# Patient Record
Sex: Female | Born: 1980 | Race: White | Hispanic: No | Smoking: Former smoker
Health system: Southern US, Community
[De-identification: ages and names within clinical notes are randomized; demographics above are authoritative.]

## PROBLEM LIST (undated history)

## (undated) DIAGNOSIS — K219 Gastro-esophageal reflux disease without esophagitis: Secondary | ICD-10-CM

## (undated) DIAGNOSIS — N189 Chronic kidney disease, unspecified: Secondary | ICD-10-CM

## (undated) DIAGNOSIS — F603 Borderline personality disorder: Secondary | ICD-10-CM

## (undated) DIAGNOSIS — F32A Depression, unspecified: Secondary | ICD-10-CM

## (undated) DIAGNOSIS — T4145XA Adverse effect of unspecified anesthetic, initial encounter: Secondary | ICD-10-CM

## (undated) DIAGNOSIS — N63 Unspecified lump in unspecified breast: Secondary | ICD-10-CM

## (undated) DIAGNOSIS — M419 Scoliosis, unspecified: Secondary | ICD-10-CM

## (undated) DIAGNOSIS — E785 Hyperlipidemia, unspecified: Secondary | ICD-10-CM

## (undated) DIAGNOSIS — J45909 Unspecified asthma, uncomplicated: Secondary | ICD-10-CM

## (undated) DIAGNOSIS — Z87442 Personal history of urinary calculi: Secondary | ICD-10-CM

## (undated) DIAGNOSIS — T7840XA Allergy, unspecified, initial encounter: Secondary | ICD-10-CM

## (undated) DIAGNOSIS — G4730 Sleep apnea, unspecified: Secondary | ICD-10-CM

## (undated) DIAGNOSIS — IMO0002 Reserved for concepts with insufficient information to code with codable children: Secondary | ICD-10-CM

## (undated) DIAGNOSIS — F4310 Post-traumatic stress disorder, unspecified: Secondary | ICD-10-CM

## (undated) DIAGNOSIS — T753XXA Motion sickness, initial encounter: Secondary | ICD-10-CM

## (undated) DIAGNOSIS — T8859XA Other complications of anesthesia, initial encounter: Secondary | ICD-10-CM

## (undated) DIAGNOSIS — M1990 Unspecified osteoarthritis, unspecified site: Secondary | ICD-10-CM

## (undated) DIAGNOSIS — I10 Essential (primary) hypertension: Secondary | ICD-10-CM

## (undated) DIAGNOSIS — F419 Anxiety disorder, unspecified: Secondary | ICD-10-CM

## (undated) DIAGNOSIS — IMO0001 Reserved for inherently not codable concepts without codable children: Secondary | ICD-10-CM

## (undated) HISTORY — DX: Allergy, unspecified, initial encounter: T78.40XA

## (undated) HISTORY — DX: Depression, unspecified: F32.A

## (undated) HISTORY — DX: Unspecified asthma, uncomplicated: J45.909

## (undated) HISTORY — DX: Unspecified osteoarthritis, unspecified site: M19.90

## (undated) HISTORY — DX: Essential (primary) hypertension: I10

## (undated) HISTORY — DX: Unspecified lump in unspecified breast: N63.0

## (undated) HISTORY — DX: Reserved for concepts with insufficient information to code with codable children: IMO0002

## (undated) HISTORY — PX: ESOPHAGOGASTRODUODENOSCOPY: SHX1529

---
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
## 1999-01-09 DIAGNOSIS — IMO0002 Reserved for concepts with insufficient information to code with codable children: Secondary | ICD-10-CM

## 1999-01-09 HISTORY — DX: Reserved for concepts with insufficient information to code with codable children: IMO0002

## 2001-01-08 DIAGNOSIS — J45909 Unspecified asthma, uncomplicated: Secondary | ICD-10-CM

## 2001-01-08 HISTORY — DX: Unspecified asthma, uncomplicated: J45.909

## 2002-01-08 HISTORY — PX: GUM SURGERY: SHX658

## 2004-01-09 DIAGNOSIS — M1990 Unspecified osteoarthritis, unspecified site: Secondary | ICD-10-CM

## 2004-01-09 HISTORY — DX: Unspecified osteoarthritis, unspecified site: M19.90

## 2006-01-08 HISTORY — PX: ABDOMINAL HYSTERECTOMY: SHX81

## 2008-05-13 ENCOUNTER — Emergency Department: Payer: Self-pay | Admitting: Unknown Physician Specialty

## 2008-09-06 ENCOUNTER — Emergency Department: Payer: Self-pay | Admitting: Internal Medicine

## 2008-12-18 ENCOUNTER — Emergency Department: Payer: Self-pay | Admitting: Emergency Medicine

## 2009-04-07 ENCOUNTER — Emergency Department: Payer: Self-pay | Admitting: Emergency Medicine

## 2009-06-28 ENCOUNTER — Emergency Department: Payer: Self-pay | Admitting: Emergency Medicine

## 2010-01-13 ENCOUNTER — Emergency Department: Payer: Self-pay | Admitting: Unknown Physician Specialty

## 2010-06-06 ENCOUNTER — Emergency Department: Payer: Self-pay | Admitting: Emergency Medicine

## 2010-06-12 ENCOUNTER — Emergency Department: Payer: Self-pay | Admitting: Emergency Medicine

## 2011-01-09 DIAGNOSIS — I10 Essential (primary) hypertension: Secondary | ICD-10-CM

## 2011-01-09 DIAGNOSIS — N63 Unspecified lump in unspecified breast: Secondary | ICD-10-CM

## 2011-01-09 HISTORY — DX: Essential (primary) hypertension: I10

## 2011-01-09 HISTORY — DX: Unspecified lump in unspecified breast: N63.0

## 2011-01-09 HISTORY — PX: BREAST BIOPSY: SHX20

## 2011-02-05 ENCOUNTER — Emergency Department: Payer: Self-pay | Admitting: Emergency Medicine

## 2011-02-28 ENCOUNTER — Emergency Department: Payer: Self-pay | Admitting: Emergency Medicine

## 2011-10-13 ENCOUNTER — Emergency Department: Payer: Self-pay | Admitting: Unknown Physician Specialty

## 2011-10-17 ENCOUNTER — Ambulatory Visit: Payer: Self-pay

## 2011-10-18 ENCOUNTER — Ambulatory Visit: Payer: Self-pay

## 2012-01-29 ENCOUNTER — Emergency Department: Payer: Self-pay | Admitting: Emergency Medicine

## 2012-03-08 ENCOUNTER — Encounter: Payer: Self-pay | Admitting: *Deleted

## 2012-03-12 ENCOUNTER — Emergency Department: Payer: Self-pay | Admitting: Emergency Medicine

## 2012-04-14 ENCOUNTER — Ambulatory Visit: Payer: Self-pay | Admitting: General Surgery

## 2012-05-14 ENCOUNTER — Encounter: Payer: Self-pay | Admitting: *Deleted

## 2012-09-27 ENCOUNTER — Emergency Department: Payer: Self-pay | Admitting: Emergency Medicine

## 2012-12-31 ENCOUNTER — Emergency Department: Payer: Self-pay | Admitting: Internal Medicine

## 2012-12-31 LAB — URINALYSIS, COMPLETE
Bilirubin,UR: NEGATIVE
Blood: NEGATIVE
Glucose,UR: NEGATIVE mg/dL (ref 0–75)
Ketone: NEGATIVE
Specific Gravity: 1.014 (ref 1.003–1.030)
WBC UR: <1 /HPF (ref 0–5)

## 2012-12-31 LAB — CBC WITH DIFFERENTIAL/PLATELET
Basophil %: 0.5 %
Eosinophil #: 0.3 10*3/uL (ref 0.0–0.7)
HCT: 43.2 % (ref 35.0–47.0)
Lymphocyte #: 2 10*3/uL (ref 1.0–3.6)
Lymphocyte %: 29.5 %
MCH: 30.2 pg (ref 26.0–34.0)
MCHC: 32.8 g/dL (ref 32.0–36.0)
MCV: 92 fL (ref 80–100)
Monocyte #: 0.3 x10 3/mm (ref 0.2–0.9)
Platelet: 192 10*3/uL (ref 150–440)
RBC: 4.7 10*6/uL (ref 3.80–5.20)
RDW: 12.8 % (ref 11.5–14.5)

## 2012-12-31 LAB — COMPREHENSIVE METABOLIC PANEL
Alkaline Phosphatase: 51 U/L
Anion Gap: 5 — ABNORMAL LOW (ref 7–16)
BUN: 14 mg/dL (ref 7–18)
Bilirubin,Total: 0.3 mg/dL (ref 0.2–1.0)
Calcium, Total: 8.9 mg/dL (ref 8.5–10.1)
Co2: 27 mmol/L (ref 21–32)
Creatinine: 1.09 mg/dL (ref 0.60–1.30)
EGFR (African American): >60
EGFR (Non-African Amer.): >60
Potassium: 3.7 mmol/L (ref 3.5–5.1)
SGOT(AST): 4 U/L — ABNORMAL LOW (ref 15–37)
SGPT (ALT): 22 U/L (ref 12–78)
Total Protein: 7.3 g/dL (ref 6.4–8.2)

## 2012-12-31 LAB — LIPASE, BLOOD: Lipase: 293 U/L (ref 73–393)

## 2013-01-02 ENCOUNTER — Emergency Department: Payer: Self-pay | Admitting: Emergency Medicine

## 2013-01-03 LAB — COMPREHENSIVE METABOLIC PANEL
Albumin: 3.7 g/dL (ref 3.4–5.0)
BUN: 17 mg/dL (ref 7–18)
Bilirubin,Total: 0.3 mg/dL (ref 0.2–1.0)
Calcium, Total: 8.3 mg/dL — ABNORMAL LOW (ref 8.5–10.1)
Chloride: 108 mmol/L — ABNORMAL HIGH (ref 98–107)
Creatinine: 1.22 mg/dL (ref 0.60–1.30)
EGFR (African American): >60
EGFR (Non-African Amer.): 58 — ABNORMAL LOW
Osmolality: 279 (ref 275–301)
SGOT(AST): 13 U/L — ABNORMAL LOW (ref 15–37)
SGPT (ALT): 37 U/L (ref 12–78)
Sodium: 139 mmol/L (ref 136–145)
Total Protein: 7 g/dL (ref 6.4–8.2)

## 2013-01-03 LAB — CBC WITH DIFFERENTIAL/PLATELET
Basophil #: 0 10*3/uL (ref 0.0–0.1)
Basophil %: 0.5 %
Eosinophil %: 2.3 %
HCT: 41.4 % (ref 35.0–47.0)
Lymphocyte #: 3.2 10*3/uL (ref 1.0–3.6)
Lymphocyte %: 40.7 %
MCH: 31.2 pg (ref 26.0–34.0)
MCV: 92 fL (ref 80–100)
Monocyte %: 8.5 %
Platelet: 201 10*3/uL (ref 150–440)
RBC: 4.49 10*6/uL (ref 3.80–5.20)
RDW: 13 % (ref 11.5–14.5)
WBC: 7.9 10*3/uL (ref 3.6–11.0)

## 2013-01-03 LAB — URINALYSIS, COMPLETE
Bilirubin,UR: NEGATIVE
Blood: NEGATIVE
Nitrite: NEGATIVE
Ph: 6 (ref 4.5–8.0)
Specific Gravity: 1.025 (ref 1.003–1.030)
WBC UR: 1 /HPF (ref 0–5)

## 2013-01-03 LAB — LIPASE, BLOOD: Lipase: 194 U/L (ref 73–393)

## 2013-01-06 ENCOUNTER — Emergency Department: Payer: Self-pay | Admitting: Emergency Medicine

## 2013-01-06 LAB — CBC WITH DIFFERENTIAL/PLATELET
Basophil %: 0.6 %
Eosinophil #: 0.2 10*3/uL (ref 0.0–0.7)
Eosinophil %: 2.8 %
HGB: 13.1 g/dL (ref 12.0–16.0)
Lymphocyte #: 2 10*3/uL (ref 1.0–3.6)
Lymphocyte %: 28.3 %
MCH: 31.4 pg (ref 26.0–34.0)
MCHC: 34.2 g/dL (ref 32.0–36.0)
Monocyte #: 0.5 x10 3/mm (ref 0.2–0.9)
Neutrophil %: 61.5 %
Platelet: 179 10*3/uL (ref 150–440)
RBC: 4.16 10*6/uL (ref 3.80–5.20)
RDW: 12.6 % (ref 11.5–14.5)
WBC: 7.1 10*3/uL (ref 3.6–11.0)

## 2013-01-06 LAB — COMPREHENSIVE METABOLIC PANEL
Albumin: 3.5 g/dL (ref 3.4–5.0)
Co2: 28 mmol/L (ref 21–32)
Creatinine: 0.99 mg/dL (ref 0.60–1.30)
EGFR (African American): >60
EGFR (Non-African Amer.): >60
Osmolality: 273 (ref 275–301)
SGOT(AST): 13 U/L — ABNORMAL LOW (ref 15–37)
SGPT (ALT): 23 U/L (ref 12–78)
Sodium: 137 mmol/L (ref 136–145)

## 2013-01-06 LAB — URINALYSIS, COMPLETE
Blood: NEGATIVE
Glucose,UR: NEGATIVE mg/dL (ref 0–75)
Ph: 5 (ref 4.5–8.0)
RBC,UR: 1 /HPF (ref 0–5)
WBC UR: <1 /HPF (ref 0–5)

## 2013-01-15 ENCOUNTER — Ambulatory Visit: Payer: Self-pay | Admitting: Gastroenterology

## 2013-08-26 ENCOUNTER — Emergency Department: Payer: Self-pay | Admitting: Internal Medicine

## 2013-08-26 LAB — COMPREHENSIVE METABOLIC PANEL
ALBUMIN: 3.8 g/dL (ref 3.4–5.0)
ALK PHOS: 41 U/L — AB
ALT: 19 U/L
Anion Gap: 8 (ref 7–16)
BUN: 14 mg/dL (ref 7–18)
Bilirubin,Total: 0.4 mg/dL (ref 0.2–1.0)
CALCIUM: 8.6 mg/dL (ref 8.5–10.1)
CHLORIDE: 110 mmol/L — AB (ref 98–107)
CREATININE: 0.89 mg/dL (ref 0.60–1.30)
Co2: 26 mmol/L (ref 21–32)
EGFR (African American): >60
EGFR (Non-African Amer.): >60
GLUCOSE: 84 mg/dL (ref 65–99)
OSMOLALITY: 287 (ref 275–301)
Potassium: 4.1 mmol/L (ref 3.5–5.1)
SGOT(AST): <5 U/L — ABNORMAL LOW (ref 15–37)
Sodium: 144 mmol/L (ref 136–145)
TOTAL PROTEIN: 6.7 g/dL (ref 6.4–8.2)

## 2013-08-26 LAB — URINALYSIS, COMPLETE
BLOOD: NEGATIVE
Bilirubin,UR: NEGATIVE
GLUCOSE, UR: NEGATIVE mg/dL (ref 0–75)
KETONE: NEGATIVE
LEUKOCYTE ESTERASE: NEGATIVE
Nitrite: NEGATIVE
PROTEIN: NEGATIVE
Ph: 6 (ref 4.5–8.0)
RBC,UR: 1 /HPF (ref 0–5)
Specific Gravity: 1.021 (ref 1.003–1.030)

## 2013-08-26 LAB — DRUG SCREEN, URINE

## 2013-08-26 LAB — TSH: Thyroid Stimulating Horm: 1.65 u[IU]/mL

## 2013-08-26 LAB — CBC
HCT: 41.9 % (ref 35.0–47.0)
HGB: 13.5 g/dL (ref 12.0–16.0)
MCH: 30.6 pg (ref 26.0–34.0)
MCHC: 32.2 g/dL (ref 32.0–36.0)
MCV: 95 fL (ref 80–100)
PLATELETS: 183 10*3/uL (ref 150–440)
RBC: 4.4 10*6/uL (ref 3.80–5.20)
RDW: 12.6 % (ref 11.5–14.5)
WBC: 5.3 10*3/uL (ref 3.6–11.0)

## 2013-08-26 LAB — ACETAMINOPHEN LEVEL: Acetaminophen: <2 ug/mL

## 2013-08-26 LAB — SALICYLATE LEVEL

## 2013-11-09 ENCOUNTER — Encounter: Payer: Self-pay | Admitting: *Deleted

## 2014-04-10 ENCOUNTER — Emergency Department
Admit: 2014-04-10 | Disposition: A | Discharge status: Home / Self Care | Payer: Self-pay | Admitting: Physician Assistant

## 2014-07-26 ENCOUNTER — Emergency Department
Admission: EM | Admit: 2014-07-26 | Discharge: 2014-07-26 | Disposition: A | Discharge status: Home / Self Care | Payer: BLUE CROSS/BLUE SHIELD | Attending: Emergency Medicine | Admitting: Emergency Medicine

## 2014-07-26 ENCOUNTER — Emergency Department: Payer: BLUE CROSS/BLUE SHIELD

## 2014-07-26 DIAGNOSIS — J45901 Unspecified asthma with (acute) exacerbation: Secondary | ICD-10-CM | POA: Diagnosis not present

## 2014-07-26 DIAGNOSIS — Z87891 Personal history of nicotine dependence: Secondary | ICD-10-CM | POA: Insufficient documentation

## 2014-07-26 DIAGNOSIS — J40 Bronchitis, not specified as acute or chronic: Secondary | ICD-10-CM

## 2014-07-26 DIAGNOSIS — I10 Essential (primary) hypertension: Secondary | ICD-10-CM | POA: Diagnosis not present

## 2014-07-26 DIAGNOSIS — R05 Cough: Secondary | ICD-10-CM | POA: Diagnosis present

## 2014-07-26 DIAGNOSIS — R059 Cough, unspecified: Secondary | ICD-10-CM

## 2014-07-26 MED ORDER — ALBUTEROL SULFATE HFA 108 (90 BASE) MCG/ACT IN AERS: 2.0000 | INHALATION_SPRAY | Freq: Four times a day (QID) | RESPIRATORY_TRACT | Status: DC | PRN

## 2014-07-26 MED ORDER — IPRATROPIUM-ALBUTEROL 0.5-2.5 (3) MG/3ML IN SOLN
3.0000 mL | Freq: Once | RESPIRATORY_TRACT | Status: AC
  Administered 2014-07-26: 3 mL via RESPIRATORY_TRACT
  Filled 2014-07-26: qty 3

## 2014-07-26 MED ORDER — PREDNISONE 50 MG PO TABS: 50.0000 mg | ORAL_TABLET | Freq: Every day | ORAL | Status: DC

## 2014-07-26 NOTE — ED Provider Notes (Signed)
[facility] Emergency Department Provider Note  ____________________________________________  Time seen: On arrival  I have reviewed the triage vital signs and the nursing notes.   HISTORY  Chief Complaint Cough    HPI Barbara Pennington is a 34 y.o. female who presents with a cough. She reports she has had a cough for approximately one month but it seemed to get much worse last night when she was exposed to dust in her basement. She has been coughing nonstop she reports that she denies fevers chills. No pleurisy. No history of blood clots, no recent surgery, no recent travel. No chest pain. She does report a history of asthma when she was in her early 66s but stopped after she quit smoking.    Past Medical History  Diagnosis Date  . Arthritis 2006  . Asthma 2003  . Hypertension 2013  . Ulcer 2001  . Lump or mass in breast 2013    RIGHT BREAST    Patient Active Problem List   Diagnosis Date Noted  . Hypertension     Past Surgical History  Procedure Laterality Date  . Abdominal hysterectomy  2008  . Tonsillectomy  1984  . Cesarean section  2007  . Gum surgery  2004    No current outpatient prescriptions on file.  Allergies Review of patient's allergies indicates no known allergies.  Family History  Problem Relation Age of Onset  . Testicular cancer Brother   . Colon cancer Paternal Grandfather     Social History History  Substance Use Topics  . Smoking status: Former Smoker -- 0.50 packs/day for 10 years  . Smokeless tobacco: Not on file  . Alcohol Use: Yes    Review of Systems  Constitutional: Negative for fever. Eyes: Negative for visual changes. ENT: Negative for sore throat   Genitourinary: Negative for dysuria. Musculoskeletal: Negative for back pain. Skin: Negative for rash. Neurological: Negative for headaches or focal weakness   ____________________________________________   PHYSICAL EXAM:  VITAL SIGNS: ED  Triage Vitals  Enc Vitals Group     BP 07/26/14 0750 134/82 mmHg     Pulse Rate 07/26/14 0750 83     Resp 07/26/14 0750 18     Temp 07/26/14 0750 98.3 F (36.8 C)     Temp Source 07/26/14 0750 Oral     SpO2 07/26/14 0750 98 %     Weight 07/26/14 0750 260 lb (117.935 kg)     Height 07/26/14 0750 5\' 4"  (1.626 m)     Head Cir --      Peak Flow --      Pain Score --      Pain Loc --      Pain Edu? --      Excl. in [place]? --      Constitutional: Alert and oriented. Well appearing and in no distress. Eyes: Conjunctivae are normal.  ENT   Head: Normocephalic and atraumatic.   Mouth/Throat: Mucous membranes are moist. Cardiovascular: Normal rate, regular rhythm.  Respiratory: Scattered wheezes, normal respiratory effort Gastrointestinal: Soft and non-tender in all quadrants. No distention. There is no CVA tenderness. Musculoskeletal: Nontender with normal range of motion in all extremities. Neurologic:  Normal speech and language. No gross focal neurologic deficits are appreciated. Skin:  Skin is warm, dry and intact. No rash noted. Psychiatric: Mood and affect are normal. Patient exhibits appropriate insight and judgment.  ____________________________________________    LABS (pertinent positives/negatives)  Labs Reviewed - No data to display  ____________________________________________  ____________________________________________    RADIOLOGY I have personally reviewed any xrays that were ordered on this patient: Chest x-ray unremarkable on my review  ____________________________________________   PROCEDURES  Procedure(s) performed: none   ____________________________________________   INITIAL IMPRESSION / ASSESSMENT AND PLAN / ED COURSE  Pertinent labs & imaging results that were available during my care of the patient were reviewed by me and considered in my medical decision making (see chart for details).  Patient well-appearing. Differential  includes bronchitis versus bronchospasm secondary to environmental exposure. We will check x-ray, do a DuoNeb and reevaluate   ----------------------------------------- 8:39 AM on 07/26/2014 ----------------------------------------- Chest x-ray read as consistent with bronchitic changes per radiologist. We will treat with steroid's and albuterol inhaler as needed  ____________________________________________   FINAL CLINICAL IMPRESSION(S) / ED DIAGNOSES  Final diagnoses:  Bronchitis     Lavonia Drafts, MD 07/26/14 [id number]

## 2014-07-26 NOTE — ED Notes (Signed)
Pt c/o nonproductive cough with some SOB since going into a dusty basement yesterday

## 2014-07-26 NOTE — Discharge Instructions (Signed)
Upper Respiratory Infection, Adult An upper respiratory infection (URI) is also sometimes known as the common cold. The upper respiratory tract includes the nose, sinuses, throat, trachea, and bronchi. Bronchi are the airways leading to the lungs. Most people improve within 1 week, but symptoms can last up to 2 weeks. A residual cough may last even longer.  CAUSES Many different viruses can infect the tissues lining the upper respiratory tract. The tissues become irritated and inflamed and often become very moist. Mucus production is also common. A cold is contagious. You can easily spread the virus to others by oral contact. This includes kissing, sharing a glass, coughing, or sneezing. Touching your mouth or nose and then touching a surface, which is then touched by another person, can also spread the virus. SYMPTOMS  Symptoms typically develop 1 to 3 days after you come in contact with a cold virus. Symptoms vary from person to person. They may include:  Runny nose.  Sneezing.  Nasal congestion.  Sinus irritation.  Sore throat.  Loss of voice (laryngitis).  Cough.  Fatigue.  Muscle aches.  Loss of appetite.  Headache.  Low-grade fever. DIAGNOSIS  You might diagnose your own cold based on familiar symptoms, since most people get a cold 2 to 3 times a year. Your caregiver can confirm this based on your exam. Most importantly, your caregiver can check that your symptoms are not due to another disease such as strep throat, sinusitis, pneumonia, asthma, or epiglottitis. Blood tests, throat tests, and X-rays are not necessary to diagnose a common cold, but they may sometimes be helpful in excluding other more serious diseases. Your caregiver will decide if any further tests are required. RISKS AND COMPLICATIONS  You may be at risk for a more severe case of the common cold if you smoke cigarettes, have chronic heart disease (such as heart failure) or lung disease (such as asthma), or if  you have a weakened immune system. The very young and very old are also at risk for more serious infections. Bacterial sinusitis, middle ear infections, and bacterial pneumonia can complicate the common cold. The common cold can worsen asthma and chronic obstructive pulmonary disease (COPD). Sometimes, these complications can require emergency medical care and may be life-threatening. PREVENTION  The best way to protect against getting a cold is to practice good hygiene. Avoid oral or hand contact with people with cold symptoms. Wash your hands often if contact occurs. There is no clear evidence that vitamin C, vitamin E, echinacea, or exercise reduces the chance of developing a cold. However, it is always recommended to get plenty of rest and practice good nutrition. TREATMENT  Treatment is directed at relieving symptoms. There is no cure. Antibiotics are not effective, because the infection is caused by a virus, not by bacteria. Treatment may include:  Increased fluid intake. Sports drinks offer valuable electrolytes, sugars, and fluids.  Breathing heated mist or steam (vaporizer or shower).  Eating chicken soup or other clear broths, and maintaining good nutrition.  Getting plenty of rest.  Using gargles or lozenges for comfort.  Controlling fevers with ibuprofen or acetaminophen as directed by your caregiver.  Increasing usage of your inhaler if you have asthma. Zinc gel and zinc lozenges, taken in the first 24 hours of the common cold, can shorten the duration and lessen the severity of symptoms. Pain medicines may help with fever, muscle aches, and throat pain. A variety of non-prescription medicines are available to treat congestion and runny nose. Your caregiver   can make recommendations and may suggest nasal or lung inhalers for other symptoms.  HOME CARE INSTRUCTIONS   Only take over-the-counter or prescription medicines for pain, discomfort, or fever as directed by your  caregiver.  Use a warm mist humidifier or inhale steam from a shower to increase air moisture. This may keep secretions moist and make it easier to breathe.  Drink enough water and fluids to keep your urine clear or pale yellow.  Rest as needed.  Return to work when your temperature has returned to normal or as your caregiver advises. You may need to stay home longer to avoid infecting others. You can also use a face mask and careful hand washing to prevent spread of the virus. SEEK MEDICAL CARE IF:   After the first few days, you feel you are getting worse rather than better.  You need your caregiver's advice about medicines to control symptoms.  You develop chills, worsening shortness of breath, or brown or red sputum. These may be signs of pneumonia.  You develop yellow or brown nasal discharge or pain in the face, especially when you bend forward. These may be signs of sinusitis.  You develop a fever, swollen neck glands, pain with swallowing, or white areas in the back of your throat. These may be signs of strep throat. SEEK IMMEDIATE MEDICAL CARE IF:   You have a fever.  You develop severe or persistent headache, ear pain, sinus pain, or chest pain.  You develop wheezing, a prolonged cough, cough up blood, or have a change in your usual mucus (if you have chronic lung disease).  You develop sore muscles or a stiff neck. Document Released: 06/20/2000 Document Revised: 03/19/2011 Document Reviewed: 04/01/2013 ExitCare Patient Information 2015 [employer]. This information is not intended to replace advice given to you by your health care provider. Make sure you discuss any questions you have with your health care provider.  

## 2014-07-26 NOTE — ED Notes (Signed)
C/o cough past few weeks, hx asthma 15 years ago when she used to smoke, does not smoke now

## 2014-08-13 ENCOUNTER — Encounter: Payer: Self-pay | Admitting: *Deleted

## 2014-08-17 NOTE — Discharge Instructions (Signed)
[facility] °[facility] ° °POST OPERATIVE INSTRUCTIONS FOR DR. TROXLER AND DR. FOWLER °[facility] ° ° °1. Take your medication as prescribed.  Pain medication should be taken only as needed. ° °2. Keep the dressing clean, dry and intact. ° °3. Keep your foot elevated above the heart level for the first 48 hours. ° °4. Walking to the bathroom and brief periods of walking are acceptable, unless we have instructed you to be non-weight bearing. ° °5. Always wear your post-op shoe when walking.  Always use your crutches if you are to be non-weight bearing. ° °6. Do not take a shower. Baths are permissible as long as the foot is kept out of the water.  ° °7. Every hour you are awake:  °- Bend your knee 15 times. °- Flex foot 15 times °- Massage calf 15 times ° °8. Call [facility] ([phone]) if any of the following problems occur: °- You develop a temperature or fever. °- The bandage becomes saturated with blood. °- Medication does not stop your pain. °- Injury of the foot occurs. °- Any symptoms of infection including redness, odor, or red streaks running from wound. °-  ° °General Anesthesia, Care After °Refer to this sheet in the next few weeks. These instructions provide you with information on caring for yourself after your procedure. Your health care provider may also give you more specific instructions. Your treatment has been planned according to current medical practices, but problems sometimes occur. Call your health care provider if you have any problems or questions after your procedure. °WHAT TO EXPECT AFTER THE PROCEDURE °After the procedure, it is typical to experience: °· Sleepiness. °· Nausea and vomiting. °HOME CARE INSTRUCTIONS °· For the first 24 hours after general anesthesia: °¨ Have a responsible person with you. °¨ Do not drive a car. If you are alone, do not take public transportation. °¨ Do not drink alcohol. °¨ Do not take medicine  that has not been prescribed by your health care provider. °¨ Do not sign important papers or make important decisions. °¨ You may resume a normal diet and activities as directed by your health care provider. °· Change bandages (dressings) as directed. °· If you have questions or problems that seem related to general anesthesia, call the hospital and ask for the anesthetist or anesthesiologist on call. °SEEK MEDICAL CARE IF: °· You have nausea and vomiting that continue the day after anesthesia. °· You develop a rash. °SEEK IMMEDIATE MEDICAL CARE IF:  °· You have difficulty breathing. °· You have chest pain. °· You have any allergic problems. °Document Released: 04/02/2000 Document Revised: 12/30/2012 Document Reviewed: 07/10/2012 °ExitCare® Patient Information ©2015 [employer]. This information is not intended to replace advice given to you by your health care provider. Make sure you discuss any questions you have with your health care provider. ° °

## 2014-08-19 ENCOUNTER — Encounter
Admission: RE | Disposition: A | Discharge status: Home / Self Care | Payer: Self-pay | Source: Ambulatory Visit | Attending: Podiatry

## 2014-08-19 ENCOUNTER — Ambulatory Visit
Admission: RE | Admit: 2014-08-19 | Discharge: 2014-08-19 | Disposition: A | Discharge status: Home / Self Care | Payer: BLUE CROSS/BLUE SHIELD | Source: Ambulatory Visit | Attending: Podiatry | Admitting: Podiatry

## 2014-08-19 ENCOUNTER — Ambulatory Visit: Payer: BLUE CROSS/BLUE SHIELD | Admitting: Anesthesiology

## 2014-08-19 ENCOUNTER — Encounter: Payer: Self-pay | Admitting: *Deleted

## 2014-08-19 DIAGNOSIS — Z90710 Acquired absence of both cervix and uterus: Secondary | ICD-10-CM | POA: Insufficient documentation

## 2014-08-19 DIAGNOSIS — Z91040 Latex allergy status: Secondary | ICD-10-CM | POA: Insufficient documentation

## 2014-08-19 DIAGNOSIS — M722 Plantar fascial fibromatosis: Secondary | ICD-10-CM | POA: Diagnosis present

## 2014-08-19 DIAGNOSIS — F17200 Nicotine dependence, unspecified, uncomplicated: Secondary | ICD-10-CM | POA: Diagnosis not present

## 2014-08-19 DIAGNOSIS — Z9889 Other specified postprocedural states: Secondary | ICD-10-CM | POA: Diagnosis not present

## 2014-08-19 DIAGNOSIS — M069 Rheumatoid arthritis, unspecified: Secondary | ICD-10-CM | POA: Insufficient documentation

## 2014-08-19 DIAGNOSIS — Z791 Long term (current) use of non-steroidal anti-inflammatories (NSAID): Secondary | ICD-10-CM | POA: Diagnosis not present

## 2014-08-19 DIAGNOSIS — Z79899 Other long term (current) drug therapy: Secondary | ICD-10-CM | POA: Insufficient documentation

## 2014-08-19 DIAGNOSIS — F329 Major depressive disorder, single episode, unspecified: Secondary | ICD-10-CM | POA: Insufficient documentation

## 2014-08-19 DIAGNOSIS — J45909 Unspecified asthma, uncomplicated: Secondary | ICD-10-CM | POA: Insufficient documentation

## 2014-08-19 HISTORY — DX: Scoliosis, unspecified: M41.9

## 2014-08-19 HISTORY — DX: Motion sickness, initial encounter: T75.3XXA

## 2014-08-19 HISTORY — DX: Adverse effect of unspecified anesthetic, initial encounter: T41.45XA

## 2014-08-19 HISTORY — DX: Reserved for inherently not codable concepts without codable children: IMO0001

## 2014-08-19 HISTORY — DX: Other complications of anesthesia, initial encounter: T88.59XA

## 2014-08-19 HISTORY — PX: PLANTAR FASCIA RELEASE: SHX2239

## 2014-08-19 SURGERY — FASCIOTOMY, PLANTAR, ENDOSCOPIC
Anesthesia: Regional | Laterality: Left | Wound class: Clean

## 2014-08-19 MED ORDER — OXYCODONE-ACETAMINOPHEN 7.5-325 MG PO TABS: 1.0000 | ORAL_TABLET | ORAL | Status: DC | PRN

## 2014-08-19 MED ORDER — ROPIVACAINE HCL 5 MG/ML IJ SOLN
INTRAMUSCULAR | Status: DC | PRN
  Administered 2014-08-19: 200 mg via EPIDURAL

## 2014-08-19 MED ORDER — OXYCODONE HCL 5 MG PO TABS: 5.0000 mg | ORAL_TABLET | Freq: Once | ORAL | Status: DC | PRN

## 2014-08-19 MED ORDER — FENTANYL CITRATE (PF) 100 MCG/2ML IJ SOLN
INTRAMUSCULAR | Status: DC | PRN
  Administered 2014-08-19: 100 ug via INTRAVENOUS

## 2014-08-19 MED ORDER — ONDANSETRON HCL 4 MG/2ML IJ SOLN: 4.0000 mg | Freq: Once | INTRAMUSCULAR | Status: DC | PRN

## 2014-08-19 MED ORDER — HYDROMORPHONE HCL 1 MG/ML IJ SOLN: 0.2500 mg | INTRAMUSCULAR | Status: DC | PRN

## 2014-08-19 MED ORDER — MIDAZOLAM HCL 2 MG/2ML IJ SOLN
INTRAMUSCULAR | Status: DC | PRN
  Administered 2014-08-19: 2 mg via INTRAVENOUS

## 2014-08-19 MED ORDER — LACTATED RINGERS IV SOLN
INTRAVENOUS | Status: DC
  Administered 2014-08-19: 08:00:00 via INTRAVENOUS

## 2014-08-19 MED ORDER — PROPOFOL 10 MG/ML IV BOLUS
INTRAVENOUS | Status: DC | PRN
  Administered 2014-08-19: 200 mg via INTRAVENOUS

## 2014-08-19 MED ORDER — ONDANSETRON HCL 4 MG/2ML IJ SOLN
INTRAMUSCULAR | Status: DC | PRN
  Administered 2014-08-19: 4 mg via INTRAVENOUS

## 2014-08-19 MED ORDER — BUPIVACAINE HCL (PF) 0.5 % IJ SOLN
INTRAMUSCULAR | Status: DC | PRN
  Administered 2014-08-19: 7 mL

## 2014-08-19 MED ORDER — DEXAMETHASONE SODIUM PHOSPHATE 4 MG/ML IJ SOLN
INTRAMUSCULAR | Status: DC | PRN
  Administered 2014-08-19: 8 mg via INTRAVENOUS

## 2014-08-19 MED ORDER — LIDOCAINE HCL (CARDIAC) 20 MG/ML IV SOLN
INTRAVENOUS | Status: DC | PRN
  Administered 2014-08-19: 40 mg via INTRATRACHEAL

## 2014-08-19 MED ORDER — CEFAZOLIN SODIUM-DEXTROSE 2-3 GM-% IV SOLR
2.0000 g | Freq: Once | INTRAVENOUS | Status: AC
  Administered 2014-08-19: 2 g via INTRAVENOUS

## 2014-08-19 MED ORDER — OXYCODONE HCL 5 MG/5ML PO SOLN: 5.0000 mg | Freq: Once | ORAL | Status: DC | PRN

## 2014-08-19 SURGICAL SUPPLY — 66 items
BANDAGE ELASTIC 4 CLIP NS LF (GAUZE/BANDAGES/DRESSINGS) ×2 IMPLANT
BENZOIN TINCTURE PRP APPL 2/3 (GAUZE/BANDAGES/DRESSINGS) IMPLANT
BLADE CRESCENTIC (BLADE) IMPLANT
BLADE ENDOTRAC PUSH EPF/EGR (MISCELLANEOUS) ×2 IMPLANT
BLADE MED AGGRESSIVE (BLADE) IMPLANT
BLADE MINI RND TIP GREEN BEAV (BLADE) IMPLANT
BLADE OSC/SAGITTAL 5.5X25 (BLADE) IMPLANT
BLADE OSC/SAGITTAL MD 5.5X18 (BLADE) IMPLANT
BLADE OSC/SAGITTAL MD 9X18.5 (BLADE) IMPLANT
BLADE OSCILLATING/SAGITTAL (BLADE)
BLADE SURG 15 STRL LF DISP TIS (BLADE) IMPLANT
BLADE SURG 15 STRL SS (BLADE)
BLADE SW THK.38XMED LNG THN (BLADE) IMPLANT
BLADE TRIANGLE EPF/EGR ENDO (BLADE) ×2 IMPLANT
BNDG ESMARK 4X12 TAN STRL LF (GAUZE/BANDAGES/DRESSINGS) ×2 IMPLANT
BNDG GAUZE 4.5X4.1 6PLY STRL (MISCELLANEOUS) ×2 IMPLANT
BNDG STRETCH 4X75 STRL LF (GAUZE/BANDAGES/DRESSINGS) ×2 IMPLANT
BUR EGG 4X8 MED (BURR) IMPLANT
BUR STRYKR EGG 5.0 (BURR) IMPLANT
CANISTER SUCT 1200ML W/VALVE (MISCELLANEOUS) ×2 IMPLANT
CAST PADDING 3X4FT ST 30246 (SOFTGOODS)
COVER PIN YLW 0.028-062 (MISCELLANEOUS) IMPLANT
CUFF TOURN SGL QUICK 18 (TOURNIQUET CUFF) IMPLANT
DRAPE FLUOR MINI C-ARM 54X84 (DRAPES) ×2 IMPLANT
DRILL WIRE PASS (DRILL) IMPLANT
DURAPREP 26ML APPLICATOR (WOUND CARE) ×2 IMPLANT
ETHIBOND 2 0 GREEN CT 2 30IN (SUTURE) IMPLANT
GAUZE PETRO XEROFOAM 1X8 (MISCELLANEOUS) ×2 IMPLANT
GAUZE PETRO XEROFOAM 5X9 (MISCELLANEOUS) ×2 IMPLANT
GAUZE SPONGE 4X4 12PLY STRL (GAUZE/BANDAGES/DRESSINGS) ×2 IMPLANT
GLOVE BIO SURGEON STRL SZ7.5 (GLOVE) ×2 IMPLANT
GLOVE BIO SURGEON STRL SZ8 (GLOVE) ×2 IMPLANT
GLOVE INDICATOR 8.0 STRL GRN (GLOVE) ×2 IMPLANT
GOWN STRL REUS W/ TWL XL LVL3 (GOWN DISPOSABLE) ×2 IMPLANT
GOWN STRL REUS W/TWL XL LVL3 (GOWN DISPOSABLE) ×2
K-WIRE DBL END TROCAR 6X.045 (WIRE)
K-WIRE DBL END TROCAR 6X.062 (WIRE)
KWIRE DBL END TROCAR 6X.045 (WIRE) IMPLANT
KWIRE DBL END TROCAR 6X.062 (WIRE) IMPLANT
NEEDLE HYPO 18GX1.5 BLUNT FILL (NEEDLE) IMPLANT
NEEDLE HYPO 25GX1X1/2 BEV (NEEDLE) IMPLANT
PACK EXTREMITY ARMC (MISCELLANEOUS) ×2 IMPLANT
PAD CAST CTTN 3X4 STRL (SOFTGOODS) IMPLANT
PAD GROUND ADULT SPLIT (MISCELLANEOUS) ×2 IMPLANT
RASP SM TEAR CROSS CUT (RASP) ×2 IMPLANT
SPLINT CAST 1 STEP 4X30 (MISCELLANEOUS) ×2 IMPLANT
SPLINT FAST PLASTER 5X30 (CAST SUPPLIES)
SPLINT PLASTER CAST FAST 5X30 (CAST SUPPLIES) IMPLANT
STOCKINETTE STRL 6IN 960660 (GAUZE/BANDAGES/DRESSINGS) ×2 IMPLANT
STRIP CLOSURE SKIN 1/4X4 (GAUZE/BANDAGES/DRESSINGS) ×2 IMPLANT
SUT ETHILON 4-0 (SUTURE)
SUT ETHILON 4-0 FS2 18XMFL BLK (SUTURE)
SUT ETHILON 5-0 FS-2 18 BLK (SUTURE) ×2 IMPLANT
SUT VIC AB 1 CT1 36 (SUTURE) IMPLANT
SUT VIC AB 2-0 CT1 27 (SUTURE)
SUT VIC AB 2-0 CT1 TAPERPNT 27 (SUTURE) IMPLANT
SUT VIC AB 2-0 SH 27 (SUTURE)
SUT VIC AB 2-0 SH 27XBRD (SUTURE) IMPLANT
SUT VIC AB 3-0 SH 27 (SUTURE)
SUT VIC AB 3-0 SH 27X BRD (SUTURE) IMPLANT
SUT VIC AB 4-0 FS2 27 (SUTURE) IMPLANT
SUT VICRYL AB 3-0 FS1 BRD 27IN (SUTURE) IMPLANT
SUTURE ETHLN 4-0 FS2 18XMF BLK (SUTURE) IMPLANT
SYRINGE 10CC LL (SYRINGE) IMPLANT
WAND TOPAZ MICRO DEBRIDER (MISCELLANEOUS) ×2 IMPLANT
WATER STERILE IRR 1000ML POUR (IV SOLUTION) ×2 IMPLANT

## 2014-08-19 NOTE — Anesthesia Procedure Notes (Addendum)
Anesthesia Regional Block:  Popliteal block  Pre-Anesthetic Checklist: ,, timeout performed, Correct Patient, Correct Site, Correct Laterality, Correct Procedure, Correct Position, site marked, Risks and benefits discussed,  Surgical consent,  Pre-op evaluation,  At surgeon's request and post-op pain management  Laterality: Left  Prep: chloraprep       Needles:  Injection technique: Single-shot  Needle Type: Echogenic Needle     Needle Length: 9cm 9 cm Needle Gauge: 21 and 21 G    Additional Needles:  Procedures: ultrasound guided (picture in chart) Popliteal block Narrative:  Start time: 08/19/2014 8:29 AM End time: 08/19/2014 8:38 AM Injection made incrementally with aspirations every 5 mL.  Performed by: Personally  Anesthesiologist: Amaryllis Dyke  Additional Notes: Functioning IV was confirmed and monitors applied. Ultrasound guidance: relevant anatomy identified, needle position confirmed, local anesthetic spread visualized around nerve(s)., vascular puncture avoided.  Image printed for medical record.  Negative aspiration and no paresthesias; incremental administration of local anesthetic. The patient tolerated the procedure well. Vitals signes recorded in RN notes.   Procedure Name: LMA Insertion Date/Time: 08/19/2014 9:28 AM Performed by: Londell Moh Pre-anesthesia Checklist: Patient identified, Emergency Drugs available, Suction available, Timeout performed and Patient being monitored Patient Re-evaluated:Patient Re-evaluated prior to inductionOxygen Delivery Method: Circle system utilized Preoxygenation: Pre-oxygenation with 100% oxygen Intubation Type: IV induction LMA: LMA inserted LMA Size: 4.0 Number of attempts: 1 Placement Confirmation: positive ETCO2 and breath sounds checked- equal and bilateral Tube secured with: Tape

## 2014-08-19 NOTE — Anesthesia Postprocedure Evaluation (Signed)
  Anesthesia Post-op Note  Patient: Barbara Pennington  Procedure(s) Performed: Procedure(s) with comments: ENDOSCOPIC PLANTAR FASCIOTOMY RELEASE L WITH TOPAZ (Left) - LMA WITH POPLITEAL BLOCK  Anesthesia type:General, Regional  Patient location: PACU  Post pain: Pain level controlled  Post assessment: Post-op Vital signs reviewed, Patient's Cardiovascular Status Stable, Respiratory Function Stable, Patent Airway and No signs of Nausea or vomiting  Post vital signs: Reviewed and stable  Last Vitals:  Filed Vitals:   08/19/14 1011  BP: 141/72  Pulse: 87  Temp: 36.7 C  Resp: 15    Level of consciousness: awake, alert  and patient cooperative  Complications: No apparent anesthesia complications

## 2014-08-19 NOTE — Anesthesia Preprocedure Evaluation (Signed)
Anesthesia Evaluation  Patient identified by MRN, date of birth, ID band Patient awake    Reviewed: Allergy & Precautions, NPO status , Patient's Chart, lab work & pertinent test results  History of Anesthesia Complications (+) history of anesthetic complications  Airway Mallampati: II  TM Distance: >3 FB Neck ROM: Full    Dental   Pulmonary shortness of breath, asthma , former smoker,    Pulmonary exam normal       Cardiovascular hypertension, Normal cardiovascular exam EKG (08/2013): Normal sinus rhythm Normal ECG   Neuro/Psych    GI/Hepatic   Endo/Other    Renal/GU      Musculoskeletal  (+) Arthritis -, Rheumatoid disorders,    Abdominal   Peds  Hematology   Anesthesia Other Findings   Reproductive/Obstetrics                             Anesthesia Physical Anesthesia Plan  ASA: II  Anesthesia Plan: General and Regional   Post-op Pain Management: MAC Combined w/ Regional for Post-op pain   Induction: Intravenous  Airway Management Planned: LMA  Additional Equipment:   Intra-op Plan:   Post-operative Plan: Extubation in OR  Informed Consent: I have reviewed the patients History and Physical, chart, labs and discussed the procedure including the risks, benefits and alternatives for the proposed anesthesia with the patient or authorized representative who has indicated his/her understanding and acceptance.     Plan Discussed with: CRNA  Anesthesia Plan Comments:         Anesthesia Quick Evaluation

## 2014-08-19 NOTE — Transfer of Care (Signed)
Immediate Anesthesia Transfer of Care Note  Patient: Barbara Pennington  Procedure(s) Performed: Procedure(s) with comments: ENDOSCOPIC PLANTAR FASCIOTOMY RELEASE L WITH TOPAZ (Left) - LMA WITH POPLITEAL BLOCK  Patient Location: PACU  Anesthesia Type: General, Regional  Level of Consciousness: awake, alert  and patient cooperative  Airway and Oxygen Therapy: Patient Spontanous Breathing and Patient connected to supplemental oxygen  Post-op Assessment: Post-op Vital signs reviewed, Patient's Cardiovascular Status Stable, Respiratory Function Stable, Patent Airway and No signs of Nausea or vomiting  Post-op Vital Signs: Reviewed and stable  Complications: No apparent anesthesia complications

## 2014-08-19 NOTE — Op Note (Signed)
Operative note   Surgeon: Dr. Albertine Patricia, DPM.    Assistant: None    Preop diagnosis: Chronic plantar fasciitis left foot    Postop diagnosis: Same    Procedure:   1. Endoscopic partial plantar fascial release left foot   2. Topaz fasciotomy left heel       EBL: 15 cc    Anesthesia:general    Hemostasis: Ankle tourniquet 275 mmHg pressure    Specimen: None    Complications: None    Operative indications: Chronic plantar fascial pain unresponsive to conservative care    Procedure:  Patient was brought into the OR and placed on the operating table in thesupine position. After anesthesia was obtained theleft lower extremity was prepped and draped in usual sterile fashion.  Operative Report: At this time attention was directed to the medial left heel. A 22-gauge needle was used to probe and 5 on the medial distal portion of the medial calcaneal tubercle at its end point. Once this was established a measurement was performed 1 cm distal to that and the skin was marked. A on centimeter incision was then made from dorsal to plantar in this region. This time it was probed bluntly with a hemostat palpating the plantar fascial ligament contours. This was carried across the plantar aspect of the plantar fascial ligament from medial to lateral. This point a second probe was put in and utilized to free fatty tissue away from the shoulder ligament. The trocar cannula was then introduced and carried all the way laterally where another half centimeter incision was made and the trocar cannula combo was exited at this point. Trocar was removed leaving the cannula in place. The slot was then rotated up against the plantar fascial ligament. The scope was introduced at this time and the plantar fascial ligament was well visualized. Some fatty tissues, galea medially and this was cleared away with cotton-tip applicator.  At this time the cannula was noted to be well positioned with the medial one  third markings present on the cannula. A combination of a triangular blade and a push blade was then used to release the plantar fascial ligament on the medial one third. Good visualization of the musculature deep to this was established. A picture was taken of the ligament as it was separated at the musculature area. Care was taken to release all the plantar fascial ligament in this region. This was done under tension of extension with the toes. At this time the plantar fascial ligament was palpated and seen to be significantly relaxed as compared to preoperative. The cannula was removed and then the area was copiously irrigated medial to lateral.  Procedure #2: At this time the plantar aspect of foot was evaluated earlier prior to bringing her back to surgery the plantar fascia been marked with 20 separate skin marks. This time a 0.062 K wire was used to penetrate percutaneously these areas and the Topaz wand was then inserted and each individually and used to ablate the plantar fascial ligament at these multiple sites. This was done while saline was running through the system. 1-2 seconds per ablation.  This time there is block 0.5% Marcaine plain and a sterile compressive dressing was placed across wound consisting of Xeroform gauze 4 x 4's Kling and Kerlix.    Patient tolerated the procedure and anesthesia well.  Was transported from the OR to the PACU with all vital signs stable and vascular status intact. To be discharged per routine protocol.  Will follow up  in approximately 1 week in the outpatient clinic. Patient be placed in a equalizer walker and Crutches will be utilized.

## 2014-08-20 ENCOUNTER — Encounter: Payer: Self-pay | Admitting: Podiatry

## 2014-10-15 ENCOUNTER — Encounter: Payer: Self-pay | Admitting: Emergency Medicine

## 2014-10-15 ENCOUNTER — Emergency Department
Admission: EM | Admit: 2014-10-15 | Discharge: 2014-10-15 | Disposition: A | Discharge status: Home / Self Care | Payer: BLUE CROSS/BLUE SHIELD | Attending: Emergency Medicine | Admitting: Emergency Medicine

## 2014-10-15 DIAGNOSIS — Z79899 Other long term (current) drug therapy: Secondary | ICD-10-CM | POA: Insufficient documentation

## 2014-10-15 DIAGNOSIS — Z7952 Long term (current) use of systemic steroids: Secondary | ICD-10-CM | POA: Insufficient documentation

## 2014-10-15 DIAGNOSIS — R7989 Other specified abnormal findings of blood chemistry: Secondary | ICD-10-CM | POA: Insufficient documentation

## 2014-10-15 DIAGNOSIS — I10 Essential (primary) hypertension: Secondary | ICD-10-CM | POA: Insufficient documentation

## 2014-10-15 DIAGNOSIS — Z87891 Personal history of nicotine dependence: Secondary | ICD-10-CM | POA: Insufficient documentation

## 2014-10-15 DIAGNOSIS — Z9289 Personal history of other medical treatment: Secondary | ICD-10-CM

## 2014-10-15 DIAGNOSIS — Z91040 Latex allergy status: Secondary | ICD-10-CM | POA: Insufficient documentation

## 2014-10-15 LAB — URINALYSIS COMPLETE WITH MICROSCOPIC (ARMC ONLY)
Bacteria, UA: NONE SEEN
Bilirubin Urine: NEGATIVE
Glucose, UA: NEGATIVE mg/dL
Hgb urine dipstick: NEGATIVE
Ketones, ur: NEGATIVE mg/dL
Leukocytes, UA: NEGATIVE
Nitrite: NEGATIVE
Protein, ur: NEGATIVE mg/dL
Specific Gravity, Urine: 1.021 (ref 1.005–1.030)
pH: 5 (ref 5.0–8.0)

## 2014-10-15 LAB — CHLAMYDIA/NGC RT PCR (ARMC ONLY)
CHLAMYDIA TR: NOT DETECTED
N GONORRHOEAE: NOT DETECTED

## 2014-10-15 LAB — RAPID HIV SCREEN (HIV 1/2 AB+AG)
HIV 1/2 Antibodies: NONREACTIVE
HIV-1 P24 Antigen - HIV24: NONREACTIVE

## 2014-10-15 LAB — WET PREP, GENITAL
TRICH WET PREP: NONE SEEN
WBC, Wet Prep HPF POC: NONE SEEN
Yeast Wet Prep HPF POC: NONE SEEN

## 2014-10-15 MED ORDER — PENICILLIN G BENZATHINE 1200000 UNIT/2ML IM SUSP
2.4000 10*6.[IU] | Freq: Once | INTRAMUSCULAR | Status: AC
  Administered 2014-10-15: 2.4 10*6.[IU] via INTRAMUSCULAR
  Filled 2014-10-15: qty 4

## 2014-10-15 NOTE — ED Provider Notes (Signed)
[facility] Emergency Department Provider Note  ____________________________________________  Time seen: Approximately 8:03 AM  I have reviewed the triage vital signs and the nursing notes.   HISTORY  Chief Complaint Exposure to STD    HPI Barbara Pennington is a 34 y.o. female she states that she was possibly exposed to an STD by her partner who was seen here last night. Desires to be checked out.Patient reports having a positive RPR when she attempted to donate blood today.   Past Medical History  Diagnosis Date  . Asthma 2003  . Hypertension 2013  . Ulcer 2001  . Lump or mass in breast 2013    RIGHT BREAST  . Complication of anesthesia     ran fever after c-section - had infection.  "Usually" has low temp after surgery.  . Scoliosis     no current issues  . Arthritis 2006    rhuematoid - mild - no current issues  . Shortness of breath dyspnea     secondary to weight   . Motion sickness     repeated amusement park rides    Patient Active Problem List   Diagnosis Date Noted  . Hypertension     Past Surgical History  Procedure Laterality Date  . Tonsillectomy  1984  . Cesarean section  2007  . Gum surgery  2004  . Abdominal hysterectomy  2008  . Plantar fascia release Left 08/19/2014    Procedure: ENDOSCOPIC PLANTAR FASCIOTOMY RELEASE L WITH TOPAZ;  Surgeon: Albertine Patricia, DPM;  Location: [place];  Service: Podiatry;  Laterality: Left;  LMA WITH POPLITEAL BLOCK    Current Outpatient Rx  Name  Route  Sig  Dispense  Refill  . albuterol (PROVENTIL HFA;VENTOLIN HFA) 108 (90 BASE) MCG/ACT inhaler   Inhalation   Inhale 2 puffs into the lungs every 6 (six) hours as needed for wheezing or shortness of breath.   1 Inhaler   2   . oxyCODONE-acetaminophen (PERCOCET) 7.5-325 MG per tablet   Oral   Take 1 tablet by mouth every 4 (four) hours as needed for severe pain.   30 tablet   0   . predniSONE (DELTASONE) 50 MG tablet    Oral   Take 1 tablet (50 mg total) by mouth daily with breakfast.   5 tablet   0   . sertraline (ZOLOFT) 100 MG tablet   Oral   Take 100 mg by mouth daily. AM           Allergies Latex  Family History  Problem Relation Age of Onset  . Testicular cancer Brother   . Colon cancer Paternal Grandfather     Social History Social History  Substance Use Topics  . Smoking status: Former Smoker -- 0.25 packs/day for 10 years    Quit date: 07/05/2014  . Smokeless tobacco: None  . Alcohol Use: Yes     Comment: rare    Review of Systems Constitutional: No fever/chills Eyes: No visual changes. ENT: No sore throat. Cardiovascular: Denies chest pain. Respiratory: Denies shortness of breath. Gastrointestinal: No abdominal pain.  No nausea, no vomiting.  No diarrhea.  No constipation. Genitourinary: Negative for dysuria. Musculoskeletal: Negative for back pain. Skin: Negative for rash. Neurological: Negative for headaches, focal weakness or numbness.  10-point ROS otherwise negative.  ____________________________________________   PHYSICAL EXAM:  VITAL SIGNS: ED Triage Vitals  Enc Vitals Group     BP --      Pulse --  Resp --      Temp --      Temp src --      SpO2 --      Weight 10/15/14 0800 265 lb (120.203 kg)     Height 10/15/14 0800 5\' 5"  (1.651 m)     Head Cir --      Peak Flow --      Pain Score --      Pain Loc --      Pain Edu? --      Excl. in [place]? --     Constitutional: Alert and oriented. Well appearing and in no acute distress. Eyes: Conjunctivae are normal. PERRL. EOMI. Head: Atraumatic. Nose: No congestion/rhinnorhea. Mouth/Throat: Mucous membranes are moist.  Oropharynx non-erythematous. Neck: No stridor.   Cardiovascular: Normal rate, regular rhythm. Grossly normal heart sounds.  Good peripheral circulation. Respiratory: Normal respiratory effort.  No retractions. Lungs CTAB. Gastrointestinal: Soft and nontender. No distention. No  abdominal bruits. No CVA tenderness. Genitourinary: Cervix intact nonfriable no CMT no adnexal tenderness no obvious discharge noted within the vaginal vault. Musculoskeletal: No lower extremity tenderness nor edema.  No joint effusions. Neurologic:  Normal speech and language. No gross focal neurologic deficits are appreciated. No gait instability. Skin:  Skin is warm, dry and intact. No rash noted. Psychiatric: Mood and affect are normal. Speech and behavior are normal.  ____________________________________________   LABS (all labs ordered are listed, but only abnormal results are displayed)  Labs Reviewed  WET PREP, GENITAL - Abnormal; Notable for the following:    Clue Cells Wet Prep HPF POC FEW (*)    All other components within normal limits  URINALYSIS COMPLETEWITH MICROSCOPIC ([facility] ONLY) - Abnormal; Notable for the following:    Color, Urine YELLOW (*)    APPearance HAZY (*)    Squamous Epithelial / LPF 0-5 (*)    All other components within normal limits  CHLAMYDIA/NGC RT PCR ([facility] ONLY)  RAPID HIV SCREEN (HIV 1/2 AB+AG)  RPR   ____________________________________________    PROCEDURES  Procedure(s) performed: None  Critical Care performed: No  ____________________________________________   INITIAL IMPRESSION / ASSESSMENT AND PLAN / ED COURSE  Pertinent labs & imaging results that were available during my care of the patient were reviewed by me and considered in my medical decision making (see chart for details).  Positive the RPR by history. Rapid HIV negative GC chlamydia pending we'll treat for the RPR and have repeated the test. Bicillin G2 0.4 million units IM given patient follow-up with her scheduled appointment health Department in one week for continued therapy weekly for 3 weeks. ____________________________________________   FINAL CLINICAL IMPRESSION(S) / ED DIAGNOSES  Final diagnoses:  History of RPR test      Arlyss Repress,  PA-C 10/15/14 1019  Delman Kitten, MD 10/15/14 1605

## 2014-10-15 NOTE — ED Notes (Addendum)
Patient reports that she was trying to donate plasma on Wednesday and was notified that she had a positive RPR. Denies any discharge or symptoms.  States she was in a relationship for 1.5 years and her bf was tested yesterday.

## 2014-10-15 NOTE — ED Notes (Signed)
Pt possibly exposed to std by her partner who was seen here last night. Wants to be checked.

## 2014-10-16 LAB — RPR: RPR: REACTIVE — AB

## 2014-10-16 LAB — RPR, QUANT+TP ABS (REFLEX): TREPONEMA PALLIDUM AB: NEGATIVE

## 2014-10-18 ENCOUNTER — Telehealth: Payer: Self-pay | Admitting: Emergency Medicine

## 2014-10-18 NOTE — ED Notes (Signed)
Pt called asking for results.  i explained that hiv , gc and chlamydia are negative.  i explained the rpr result--per meteea at [facility], pt does not have acute case and no further testing is needed.  I sent results to [facility] for review.

## 2014-11-04 ENCOUNTER — Ambulatory Visit: Payer: BLUE CROSS/BLUE SHIELD

## 2014-11-04 ENCOUNTER — Encounter (INDEPENDENT_AMBULATORY_CARE_PROVIDER_SITE_OTHER): Payer: Self-pay

## 2014-11-29 ENCOUNTER — Ambulatory Visit: Payer: BLUE CROSS/BLUE SHIELD

## 2015-02-07 ENCOUNTER — Ambulatory Visit
Admission: RE | Admit: 2015-02-07 | Discharge: 2015-02-07 | Disposition: A | Discharge status: Home / Self Care | Payer: Self-pay | Source: Ambulatory Visit | Attending: Oncology | Admitting: Oncology

## 2015-02-07 ENCOUNTER — Ambulatory Visit: Payer: Self-pay | Attending: Oncology | Admitting: *Deleted

## 2015-02-07 ENCOUNTER — Encounter: Payer: Self-pay | Admitting: *Deleted

## 2015-02-07 VITALS — BP 124/86 | HR 61 | Temp 97.2°F | Ht 67.32 in | Wt 272.6 lb

## 2015-02-07 DIAGNOSIS — N63 Unspecified lump in unspecified breast: Secondary | ICD-10-CM

## 2015-02-07 NOTE — Progress Notes (Signed)
Subjective:     Patient ID: Barbara Pennington, female   DOB: [birth year], 35 y.o.   MRN: [id number]  HPI   Review of Systems     Objective:   Physical Exam  Pulmonary/Chest: Right breast exhibits no inverted nipple, no mass, no nipple discharge, no skin change and no tenderness. Left breast exhibits mass. Left breast exhibits no inverted nipple, no nipple discharge, no skin change and no tenderness.         Assessment:     35 year old White female returns to [facility] with complaints of an enlarging left breast mass.  The patient was here in October 2013 with a mass in the same area.  Mammogram and ultrasound at that time was a birads 5.  Biopsy by Dr. 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  The patient was supposed to return in 6 months with Dr. Bary Castilla for another ultrasound, but has had no follow-up.  On clinical breast exam I can palpate an approximate 1.5 cm firm, mobile nodule at 2:30 left breast approximately 3 cm from the areola.  Taught self breast awareness.  Patient has been screened for eligibility.  She does not have any insurance, Medicare or Medicaid.  She also meets financial eligibility.  Hand-out given on the Affordable Care Act.    Plan:     Bilateral diagnostic mammogram and ultrasound ordered.  Discussed with the patient that if the radiologist want to rebiopsy the area would she like to return to Dr. Bary Castilla or have a biopsy by the breast center.  States she would like to go ahead and get it done at the breast center.  Will follow-up per protocol.

## 2015-02-07 NOTE — Patient Instructions (Signed)
Gave patient hand-out, Women Staying Healthy, Active and Well from [facility], with education on breast health, pap smears, heart and colon health. 

## 2015-02-08 ENCOUNTER — Telehealth: Payer: Self-pay | Admitting: *Deleted

## 2015-02-08 NOTE — Telephone Encounter (Signed)
Patient returned my call.  We discussed the benign mammogram results and recommendation for next mammogram at age 35.  Discussed option to see Dr. Bary Castilla for a second opinion since he was the surgeon who actually did her biopsy in 2013.  Patient states she is fine with only seeing the radiologist at this time.  They confirmed that the nodule has not had any significant change.  She is to call me back if she feels the area of concern is changing or getting larger.  She is agreeable.

## 2015-02-08 NOTE — Telephone Encounter (Signed)
Let patient a message to return my call.  I would like to discuss her mammogram results.

## 2015-05-16 ENCOUNTER — Emergency Department: Payer: Self-pay

## 2015-05-16 ENCOUNTER — Emergency Department
Admission: EM | Admit: 2015-05-16 | Discharge: 2015-05-16 | Disposition: A | Discharge status: Home / Self Care | Payer: Self-pay | Attending: Emergency Medicine | Admitting: Emergency Medicine

## 2015-05-16 ENCOUNTER — Encounter: Payer: Self-pay | Admitting: Emergency Medicine

## 2015-05-16 DIAGNOSIS — I10 Essential (primary) hypertension: Secondary | ICD-10-CM | POA: Insufficient documentation

## 2015-05-16 DIAGNOSIS — Z87891 Personal history of nicotine dependence: Secondary | ICD-10-CM | POA: Insufficient documentation

## 2015-05-16 DIAGNOSIS — Z91040 Latex allergy status: Secondary | ICD-10-CM | POA: Insufficient documentation

## 2015-05-16 DIAGNOSIS — J209 Acute bronchitis, unspecified: Secondary | ICD-10-CM | POA: Insufficient documentation

## 2015-05-16 DIAGNOSIS — J45909 Unspecified asthma, uncomplicated: Secondary | ICD-10-CM | POA: Insufficient documentation

## 2015-05-16 DIAGNOSIS — M1990 Unspecified osteoarthritis, unspecified site: Secondary | ICD-10-CM | POA: Insufficient documentation

## 2015-05-16 MED ORDER — PREDNISONE 20 MG PO TABS
60.0000 mg | ORAL_TABLET | Freq: Once | ORAL | Status: AC
  Administered 2015-05-16: 60 mg via ORAL
  Filled 2015-05-16: qty 3

## 2015-05-16 MED ORDER — IPRATROPIUM-ALBUTEROL 0.5-2.5 (3) MG/3ML IN SOLN
3.0000 mL | Freq: Once | RESPIRATORY_TRACT | Status: AC
Start: 2015-05-16 — End: 2015-05-16
  Administered 2015-05-16: 3 mL via RESPIRATORY_TRACT
  Filled 2015-05-16: qty 3

## 2015-05-16 MED ORDER — IPRATROPIUM-ALBUTEROL 0.5-2.5 (3) MG/3ML IN SOLN
3.0000 mL | Freq: Once | RESPIRATORY_TRACT | Status: AC
  Administered 2015-05-16: 3 mL via RESPIRATORY_TRACT
  Filled 2015-05-16: qty 3

## 2015-05-16 MED ORDER — ALBUTEROL SULFATE HFA 108 (90 BASE) MCG/ACT IN AERS: 2.0000 | INHALATION_SPRAY | Freq: Four times a day (QID) | RESPIRATORY_TRACT | Status: DC | PRN

## 2015-05-16 MED ORDER — PREDNISONE 10 MG PO TABS: ORAL_TABLET | ORAL | Status: DC

## 2015-05-16 MED ORDER — BENZONATATE 100 MG PO CAPS: 100.0000 mg | ORAL_CAPSULE | Freq: Three times a day (TID) | ORAL | Status: DC | PRN

## 2015-05-16 NOTE — ED Notes (Addendum)
States she woke up yesterday with sore throat   Body aches and cough  Non prod cough  And also had some SOB

## 2015-05-16 NOTE — ED Provider Notes (Signed)
[facility] Emergency Department Provider Note   ____________________________________________  Time seen: Approximately 11:33 AM  I have reviewed the triage vital signs and the nursing notes.   HISTORY  Chief Complaint Sore Throat   HPI Barbara Pennington is a 35 y.o. female is here with complaint of sore throat. Patient states she woke up yesterday with sore throat which gradually progressed to body aches and nonproductive cough. Patient denies any shortness of breath and has not actually taken her temperature but states that occasionally she has felt feverish. She also reports a moderate amount of coughing that she is unable to control until she finally vomits. She has not taken any over-the-counter medication. Patient has a history of asthma in the past but has not used an inhaler in a long time. Patient denies smoking.   Past Medical History  Diagnosis Date  . Asthma 2003  . Hypertension 2013  . Ulcer 2001  . Lump or mass in breast 2013    RIGHT BREAST  . Complication of anesthesia     ran fever after c-section - had infection.  "Usually" has low temp after surgery.  . Scoliosis     no current issues  . Arthritis 2006    rhuematoid - mild - no current issues  . Shortness of breath dyspnea     secondary to weight   . Motion sickness     repeated amusement park rides    Patient Active Problem List   Diagnosis Date Noted  . Hypertension     Past Surgical History  Procedure Laterality Date  . Tonsillectomy  1984  . Cesarean section  2007  . Gum surgery  2004  . Abdominal hysterectomy  2008  . Plantar fascia release Left 08/19/2014    Procedure: ENDOSCOPIC PLANTAR FASCIOTOMY RELEASE L WITH TOPAZ;  Surgeon: Albertine Patricia, DPM;  Location: [place];  Service: Podiatry;  Laterality: Left;  LMA WITH POPLITEAL BLOCK  . 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 albuterol (PROVENTIL HFA;VENTOLIN HFA) 108 (90 Base) MCG/ACT inhaler   Inhalation   Inhale 2 puffs into the lungs every 6 (six) hours as needed for wheezing or shortness of breath.   1 Inhaler   2   . benzonatate (TESSALON PERLES) 100 MG capsule   Oral   Take 1 capsule (100 mg total) by mouth 3 (three) times daily as needed for cough.   40 capsule   0   . predniSONE (DELTASONE) 10 MG tablet      Take 6 tablets  today, on day 2 take 5 tablets, day 3 take 4 tablets, day 4 take 3 tablets, day 5 take  2 tablets and 1 tablet the last day   21 tablet   0   . sertraline (ZOLOFT) 100 MG tablet   Oral   Take 100 mg by mouth daily. AM           Allergies Latex  Family History  Problem Relation Age of Onset  . Testicular cancer Brother   . Colon cancer Paternal Grandfather     Social History Social History  Substance Use Topics  . Smoking status: Former Smoker -- 0.25 packs/day for 10 years    Quit date: 07/05/2014  . Smokeless tobacco: None  . Alcohol Use: Yes     Comment: rare  Review of Systems Constitutional: Positive fever/chills Eyes: No visual changes. ENT: Positive sore throat. Cardiovascular: Denies chest pain. Respiratory: Denies shortness of breath. Positive nonproductive cough. Gastrointestinal: No abdominal pain.  No nausea, positive vomiting only with coughing.  No diarrhea.   Musculoskeletal: Negative for back pain. Positive generalized body aches. Skin: Negative for rash. Neurological: Negative for headaches, focal weakness or numbness.  10-point ROS otherwise negative.  ____________________________________________   PHYSICAL EXAM:  VITAL SIGNS: ED Triage Vitals  Enc Vitals Group     BP 05/16/15 1105 163/74 mmHg     Pulse Rate 05/16/15 1105 73     Resp 05/16/15 1105 20     Temp 05/16/15 1105 98.6 F (37 C)     Temp Source 05/16/15 1105 Oral     SpO2 05/16/15 1105 99 %     Weight 05/16/15 1105 270 lb (122.471 kg)     Height  05/16/15 1105 5\' 5"  (1.651 m)     Head Cir --      Peak Flow --      Pain Score 05/16/15 1106 8     Pain Loc --      Pain Edu? --      Excl. in [place]? --     Constitutional: Alert and oriented. Well appearing and in no acute distress. Eyes: Conjunctivae are normal. PERRL. EOMI. Head: Atraumatic. Nose: No congestion/rhinnorhea. Mouth/Throat: Mucous membranes are moist.  Oropharynx non-erythematous. Mild posterior drainage seen. No exudate or injection. Neck: No stridor.   Hematological/Lymphatic/Immunilogical: No cervical lymphadenopathy. Cardiovascular: Normal rate, regular rhythm. Grossly normal heart sounds.  Good peripheral circulation. Respiratory: Normal respiratory effort.  No retractions. Lungs CTAB. Patient is coughing similar to bronchospasms. Musculoskeletal: No lower extremity tenderness nor edema.  No joint effusions. Neurologic:  Normal speech and language. No gross focal neurologic deficits are appreciated. No gait instability. Skin:  Skin is warm, dry and intact. No rash noted. Psychiatric: Mood and affect are normal. Speech and behavior are normal.  ____________________________________________   LABS (all labs ordered are listed, but only abnormal results are displayed)  Labs Reviewed - No data to display RADIOLOGY  Chest x-ray per radiologist shows bilateral bronchitic changes that reflect acute bronchitis. I, Johnn Hai, personally viewed and evaluated these images (plain radiographs) as part of my medical decision making, as well as reviewing the written report by the radiologist. ____________________________________________   PROCEDURES  Procedure(s) performed: None  Critical Care performed: No  ____________________________________________   INITIAL IMPRESSION / ASSESSMENT AND PLAN / ED COURSE  Pertinent labs & imaging results that were available during my care of the patient were reviewed by me and considered in my medical decision making (see  chart for details).  Patient was given DuoNeb treatment while in the emergency room in improved as far as bronchospasms. Patient was placed on prednisone 6 day taper along with Tessalon Perles and given a prescription for an albuterol inhaler. Patient is to follow-up with her doctor or [facility] clinic if any continued problems. ____________________________________________   FINAL CLINICAL IMPRESSION(S) / ED DIAGNOSES  Final diagnoses:  Acute bronchitis, unspecified organism      NEW MEDICATIONS STARTED DURING THIS VISIT:  Discharge Medication List as of 05/16/2015  1:22 PM    START taking these medications   Details  albuterol (PROVENTIL HFA;VENTOLIN HFA) 108 (90 Base) MCG/ACT inhaler Inhale 2 puffs into the lungs every 6 (six) hours as needed for wheezing or shortness of breath., Starting 05/16/2015, Until Discontinued, Print    benzonatate (  TESSALON PERLES) 100 MG capsule Take 1 capsule (100 mg total) by mouth 3 (three) times daily as needed for cough., Starting 05/16/2015, Until Tue 05/15/16, Print    predniSONE (DELTASONE) 10 MG tablet Take 6 tablets  today, on day 2 take 5 tablets, day 3 take 4 tablets, day 4 take 3 tablets, day 5 take  2 tablets and 1 tablet the last day, Print         Note:  This document was prepared using Dragon voice recognition software and may include unintentional dictation errors.    Johnn Hai, PA-C 05/16/15 [place], MD 05/17/15 [phone]

## 2015-05-16 NOTE — ED Notes (Signed)
Reports cough, sore throat and sob since yesterday.  Mask applied.  No resp distress at this time.

## 2015-05-16 NOTE — Discharge Instructions (Signed)
Acute Bronchitis Bronchitis is when the airways that extend from the windpipe into the lungs get red, puffy, and painful (inflamed). Bronchitis often causes thick spit (mucus) to develop. This leads to a cough. A cough is the most common symptom of bronchitis. In acute bronchitis, the condition usually begins suddenly and goes away over time (usually in 2 weeks). Smoking, allergies, and asthma can make bronchitis worse. Repeated episodes of bronchitis may cause more lung problems. HOME CARE  Rest.  Drink enough fluids to keep your pee (urine) clear or pale yellow (unless you need to limit fluids as told by your doctor).  Only take over-the-counter or prescription medicines as told by your doctor.  Avoid smoking and secondhand smoke. These can make bronchitis worse. If you are a smoker, think about using nicotine gum or skin patches. Quitting smoking will help your lungs heal faster.  Reduce the chance of getting bronchitis again by:  Washing your hands often.  Avoiding people with cold symptoms.  Trying not to touch your hands to your mouth, nose, or eyes.  Follow up with your doctor as told. GET HELP IF: Your symptoms do not improve after 1 week of treatment. Symptoms include:  Cough.  Fever.  Coughing up thick spit.  Body aches.  Chest congestion.  Chills.  Shortness of breath.  Sore throat. GET HELP RIGHT AWAY IF:   You have an increased fever.  You have chills.  You have severe shortness of breath.  You have bloody thick spit (sputum).  You throw up (vomit) often.  You lose too much body fluid (dehydration).  You have a severe headache.  You faint. MAKE SURE YOU:   Understand these instructions.  Will watch your condition.  Will get help right away if you are not doing well or get worse.   This information is not intended to replace advice given to you by your health care provider. Make sure you discuss any questions you have with your health care  provider.   Document Released: 06/13/2007 Document Revised: 08/27/2012 Document Reviewed: 06/17/2012 Elsevier Interactive Patient Education 2016 [place] with your primary care doctor if any continued problems or [place] clinic. Begin taking prednisone 6 day tapered dose. Albuterol as needed for coughing and Tessalon Perles one or 2 every 8 hours as needed for coughing. Also obtain over-the-counter antihistamine such as Claritin, Zyrtec or Allegra. You may also take Benadryl however this could cause drowsiness.

## 2015-10-11 ENCOUNTER — Emergency Department
Admission: EM | Admit: 2015-10-11 | Discharge: 2015-10-11 | Disposition: A | Discharge status: Home / Self Care | Payer: No Typology Code available for payment source | Attending: Emergency Medicine | Admitting: Emergency Medicine

## 2015-10-11 DIAGNOSIS — I10 Essential (primary) hypertension: Secondary | ICD-10-CM | POA: Diagnosis not present

## 2015-10-11 DIAGNOSIS — J45909 Unspecified asthma, uncomplicated: Secondary | ICD-10-CM | POA: Diagnosis not present

## 2015-10-11 DIAGNOSIS — Z87891 Personal history of nicotine dependence: Secondary | ICD-10-CM | POA: Insufficient documentation

## 2015-10-11 DIAGNOSIS — Y92410 Unspecified street and highway as the place of occurrence of the external cause: Secondary | ICD-10-CM | POA: Diagnosis not present

## 2015-10-11 DIAGNOSIS — Y999 Unspecified external cause status: Secondary | ICD-10-CM | POA: Insufficient documentation

## 2015-10-11 DIAGNOSIS — M25511 Pain in right shoulder: Secondary | ICD-10-CM | POA: Insufficient documentation

## 2015-10-11 DIAGNOSIS — Y9389 Activity, other specified: Secondary | ICD-10-CM | POA: Diagnosis not present

## 2015-10-11 DIAGNOSIS — S161XXA Strain of muscle, fascia and tendon at neck level, initial encounter: Secondary | ICD-10-CM

## 2015-10-11 DIAGNOSIS — Z79899 Other long term (current) drug therapy: Secondary | ICD-10-CM | POA: Diagnosis not present

## 2015-10-11 DIAGNOSIS — S39012A Strain of muscle, fascia and tendon of lower back, initial encounter: Secondary | ICD-10-CM | POA: Insufficient documentation

## 2015-10-11 DIAGNOSIS — S199XXA Unspecified injury of neck, initial encounter: Secondary | ICD-10-CM | POA: Diagnosis present

## 2015-10-11 MED ORDER — METHOCARBAMOL 750 MG PO TABS: 750.0000 mg | ORAL_TABLET | Freq: Four times a day (QID) | ORAL | 0 refills | Status: DC

## 2015-10-11 MED ORDER — IBUPROFEN 600 MG PO TABS: 600.0000 mg | ORAL_TABLET | Freq: Three times a day (TID) | ORAL | 0 refills | Status: DC | PRN

## 2015-10-11 MED ORDER — OXYCODONE-ACETAMINOPHEN 5-325 MG PO TABS: 1.0000 | ORAL_TABLET | Freq: Four times a day (QID) | ORAL | 0 refills | Status: DC | PRN
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
## 2015-11-22 ENCOUNTER — Emergency Department
Admission: EM | Admit: 2015-11-22 | Discharge: 2015-11-22 | Disposition: A | Discharge status: Home / Self Care | Payer: Self-pay | Attending: Emergency Medicine | Admitting: Emergency Medicine

## 2015-11-22 ENCOUNTER — Encounter: Payer: Self-pay | Admitting: Emergency Medicine

## 2015-11-22 DIAGNOSIS — K529 Noninfective gastroenteritis and colitis, unspecified: Secondary | ICD-10-CM | POA: Insufficient documentation

## 2015-11-22 DIAGNOSIS — Z791 Long term (current) use of non-steroidal anti-inflammatories (NSAID): Secondary | ICD-10-CM | POA: Insufficient documentation

## 2015-11-22 DIAGNOSIS — Z79899 Other long term (current) drug therapy: Secondary | ICD-10-CM | POA: Insufficient documentation

## 2015-11-22 DIAGNOSIS — I10 Essential (primary) hypertension: Secondary | ICD-10-CM | POA: Insufficient documentation

## 2015-11-22 DIAGNOSIS — R21 Rash and other nonspecific skin eruption: Secondary | ICD-10-CM | POA: Insufficient documentation

## 2015-11-22 DIAGNOSIS — Z91040 Latex allergy status: Secondary | ICD-10-CM | POA: Insufficient documentation

## 2015-11-22 DIAGNOSIS — Z7952 Long term (current) use of systemic steroids: Secondary | ICD-10-CM | POA: Insufficient documentation

## 2015-11-22 DIAGNOSIS — J45909 Unspecified asthma, uncomplicated: Secondary | ICD-10-CM | POA: Insufficient documentation

## 2015-11-22 DIAGNOSIS — Z87891 Personal history of nicotine dependence: Secondary | ICD-10-CM | POA: Insufficient documentation

## 2015-11-22 LAB — BASIC METABOLIC PANEL
ANION GAP: 8 (ref 5–15)
BUN: <5 mg/dL — ABNORMAL LOW (ref 6–20)
CALCIUM: 9.4 mg/dL (ref 8.9–10.3)
CHLORIDE: 105 mmol/L (ref 101–111)
CO2: 26 mmol/L (ref 22–32)
Creatinine, Ser: 1.05 mg/dL — ABNORMAL HIGH (ref 0.44–1.00)
GFR calc non Af Amer: >60 mL/min (ref >60)
Glucose, Bld: 98 mg/dL (ref 65–99)
Potassium: 3.9 mmol/L (ref 3.5–5.1)
Sodium: 139 mmol/L (ref 135–145)

## 2015-11-22 LAB — URINALYSIS COMPLETE WITH MICROSCOPIC (ARMC ONLY)
Bilirubin Urine: NEGATIVE
Glucose, UA: NEGATIVE mg/dL
HGB URINE DIPSTICK: NEGATIVE
KETONES UR: NEGATIVE mg/dL
Leukocytes, UA: NEGATIVE
Nitrite: NEGATIVE
PH: 6 (ref 5.0–8.0)
PROTEIN: NEGATIVE mg/dL
RBC / HPF: NONE SEEN RBC/hpf (ref 0–5)
Specific Gravity, Urine: 1.009 (ref 1.005–1.030)

## 2015-11-22 LAB — CBC
HCT: 44.6 % (ref 35.0–47.0)
HEMOGLOBIN: 14.6 g/dL (ref 12.0–16.0)
MCH: 30.2 pg (ref 26.0–34.0)
MCHC: 32.8 g/dL (ref 32.0–36.0)
MCV: 92 fL (ref 80.0–100.0)
Platelets: 237 10*3/uL (ref 150–440)
RBC: 4.84 MIL/uL (ref 3.80–5.20)
RDW: 13.7 % (ref 11.5–14.5)
WBC: 8.2 10*3/uL (ref 3.6–11.0)

## 2015-11-22 LAB — RAPID HIV SCREEN (HIV 1/2 AB+AG)
HIV 1/2 Antibodies: NONREACTIVE
HIV-1 P24 Antigen - HIV24: NONREACTIVE

## 2015-11-22 LAB — PREGNANCY, URINE: PREG TEST UR: NEGATIVE

## 2015-11-22 LAB — TROPONIN I: Troponin I: <0.03 ng/mL (ref <0.03)

## 2015-11-22 LAB — LIPASE, BLOOD: Lipase: 39 U/L (ref 11–51)

## 2015-11-22 MED ORDER — RANITIDINE HCL 150 MG PO CAPS: 150.0000 mg | ORAL_CAPSULE | Freq: Two times a day (BID) | ORAL | 0 refills | Status: DC

## 2015-11-22 MED ORDER — ONDANSETRON 4 MG PO TBDP
4.0000 mg | ORAL_TABLET | Freq: Once | ORAL | Status: AC | PRN
  Administered 2015-11-22: 4 mg via ORAL
  Filled 2015-11-22: qty 1

## 2015-11-22 MED ORDER — ONDANSETRON 4 MG PO TBDP: 4.0000 mg | ORAL_TABLET | Freq: Three times a day (TID) | ORAL | 0 refills | Status: DC | PRN

## 2015-11-22 MED ORDER — PERMETHRIN 5 % EX CREA: TOPICAL_CREAM | CUTANEOUS | 1 refills | Status: DC

## 2015-11-22 MED ORDER — PROMETHAZINE HCL 25 MG PO TABS: 25.0000 mg | ORAL_TABLET | Freq: Four times a day (QID) | ORAL | 0 refills | Status: DC | PRN

## 2015-11-22 MED ORDER — LOPERAMIDE HCL 2 MG PO TABS: 4.0000 mg | ORAL_TABLET | Freq: Four times a day (QID) | ORAL | 0 refills | Status: DC | PRN

## 2015-11-22 MED ORDER — CEPHALEXIN 500 MG PO CAPS: 500.0000 mg | ORAL_CAPSULE | Freq: Three times a day (TID) | ORAL | 0 refills | Status: DC

## 2015-11-22 NOTE — ED Notes (Signed)
LAB CALLED FOR ADD ON

## 2015-11-22 NOTE — ED Triage Notes (Signed)
Patient presents to the ED with nausea, dizziness, and having trouble focusing that began at 9am.  Patient denies chest pain and shortness of breath.  Patient reports vomiting x 1 and reports diarrhea x 3 and diarrhea x 2 weeks.  Patient states that when she first felt bad she thought it was due to being out of her anxiety medication for a few days but now feels worse.  Patient is tearful during triage.  Patient denies abdominal pain.

## 2015-11-22 NOTE — ED Provider Notes (Signed)
-----------------------------------------   11:13 PM on 11/22/2015 -----------------------------------------  Patient care is him from Dr. Joni Fears. RPR pending. I have discussed this with lab, RPRs a 3 day send out lab. I have seen and evaluated the patient myself. She has lesions most consistent with bite marks, possible bed bugs or body lice. Patient also states the lesions typically appear as pimple-like lesions with white heads. Patient states this is been ongoing for 3 months or more at this point. We will discharge the patient with permethrin and a short course of antibiotics. Patient is agreeable to plan. I discussed home care of washing all of her bedding and clothes in hot water in high heat, anything that cannot be washing can be placing garbage bags for 7 days. Avoid contact with her mattress for 7 days. Patient is agreeable to plan. Patient's workup otherwise has been largely nonrevealing.   Harvest Dark, MD 11/22/15 [phone]

## 2015-11-22 NOTE — ED Notes (Signed)
Report given to [facility] RN

## 2015-11-22 NOTE — ED Notes (Signed)
Pt reports "feeling crappy" x 2 weeks, worse today.  Pt denies CP, SOB, fever, LOC or pain.  Pt reports 1 episode of emesis today.  + PO intake.  NAD.  Skin warm and dry, resp even and unlabored

## 2015-11-22 NOTE — ED Provider Notes (Signed)
[facility] Emergency Department Provider Note  ____________________________________________  Time seen: Approximately 9:25 PM  I have reviewed the triage vital signs and the nursing notes.   HISTORY  Chief Complaint Nausea and Dizziness    HPI Barbara Pennington is a 35 y.o. female who complains of nausea vomiting and diarrhea today. She also some generalized abdominal pain and discomfort. She has a child in school and She works in an office.  She is also concerned that she's had a rash for the past 2 weeks. The rashes on her buttocks as well as all 4 extremities. She also had some involvement of the soles of her feet. She reports that it starts as a very small pustular eruptions on the skin. Some very itchy so she then scratches it. Which does help it feel better, but now she has multiple excoriated lesions. She's been putting peroxide and steroid ointment on them.  She is concerned that she could have been exposed to an STI about 3 or 4 months ago. She does feel around that time she had a mild flulike illness as well. She's had a hysterectomy and she denies vaginal bleeding or discharge or dysuria.     Past Medical History:  Diagnosis Date  . Arthritis 2006   rhuematoid - mild - no current issues  . Asthma 2003  . Complication of anesthesia    ran fever after c-section - had infection.  "Usually" has low temp after surgery.  . Hypertension 2013  . Lump or mass in breast 2013   RIGHT BREAST  . Motion sickness    repeated amusement park rides  . Scoliosis    no current issues  . Shortness of breath dyspnea    secondary to weight   . Ulcer ([place]) 2001     Patient Active Problem List   Diagnosis Date Noted  . Hypertension      Past Surgical History:  Procedure Laterality Date  . ABDOMINAL HYSTERECTOMY  2008  . BREAST BIOPSY Left 2013   CORE - FIBROADENOMA VS PHYLLODES  . CESAREAN SECTION  2007  . GUM SURGERY  2004  . PLANTAR FASCIA RELEASE  Left 08/19/2014   Procedure: ENDOSCOPIC PLANTAR FASCIOTOMY RELEASE L WITH TOPAZ;  Surgeon: Albertine Patricia, DPM;  Location: [place];  Service: Podiatry;  Laterality: Left;  LMA WITH POPLITEAL BLOCK  . TONSILLECTOMY  1984     Prior to Admission medications   Medication Sig Start Date End Date Taking? Authorizing Provider  albuterol (PROVENTIL HFA;VENTOLIN HFA) 108 (90 Base) MCG/ACT inhaler Inhale 2 puffs into the lungs every 6 (six) hours as needed for wheezing or shortness of breath. 05/16/15   Johnn Hai, PA-C  benzonatate (TESSALON PERLES) 100 MG capsule Take 1 capsule (100 mg total) by mouth 3 (three) times daily as needed for cough. 05/16/15 05/15/16  Johnn Hai, PA-C  ibuprofen (ADVIL,MOTRIN) 600 MG tablet Take 1 tablet (600 mg total) by mouth every 8 (eight) hours as needed. 10/11/15   Sable Feil, PA-C  methocarbamol (ROBAXIN-750) 750 MG tablet Take 1 tablet (750 mg total) by mouth 4 (four) times daily. 10/11/15   Sable Feil, PA-C  oxyCODONE-acetaminophen (ROXICET) 5-325 MG tablet Take 1 tablet by mouth every 6 (six) hours as needed. 10/11/15 10/10/16  Sable Feil, PA-C  predniSONE (DELTASONE) 10 MG tablet Take 6 tablets  today, on day 2 take 5 tablets, day 3 take 4 tablets, day 4 take 3 tablets, day 5 take  2 tablets and 1 tablet the last day 05/16/15   Johnn Hai, PA-C  sertraline (ZOLOFT) 100 MG tablet Take 100 mg by mouth daily. AM    Historical Provider, MD     Allergies Latex   Family History  Problem Relation Age of Onset  . Testicular cancer Brother   . Colon cancer Paternal Grandfather     Social History Social History  Substance Use Topics  . Smoking status: Former Smoker    Packs/day: 0.25    Years: 10.00    Quit date: 07/05/2014  . Smokeless tobacco: Never Used  . Alcohol use Yes     Comment: rare    Review of Systems  Constitutional:   No fever or chills.  ENT:   Mild sore throat. No rhinorrhea. Cardiovascular:   No chest  pain. Respiratory:   No dyspnea or cough. Gastrointestinal:   Generalized abdominal pain with vomiting and diarrhea.  Genitourinary:   Negative for dysuria or difficulty urinating.  10-point ROS otherwise negative.  ____________________________________________   PHYSICAL EXAM:  VITAL SIGNS: ED Triage Vitals  Enc Vitals Group     BP 11/22/15 1837 (!) 138/100     Pulse Rate 11/22/15 1837 87     Resp 11/22/15 1837 18     Temp 11/22/15 1837 98.9 F (37.2 C)     Temp Source 11/22/15 1837 Oral     SpO2 11/22/15 1837 99 %     Weight 11/22/15 1837 280 lb (127 kg)     Height 11/22/15 1837 5\' 5"  (1.651 m)     Head Circumference --      Peak Flow --      Pain Score 11/22/15 1838 2     Pain Loc --      Pain Edu? --      Excl. in [place]? --     Vital signs reviewed, nursing assessments reviewed.   Constitutional:   Alert and oriented. Well appearing and in no distress. Eyes:   No scleral icterus. No conjunctival pallor. PERRL. EOMI.  No nystagmus. ENT   Head:   Normocephalic and atraumatic.   Nose:   No congestion/rhinnorhea. No septal hematoma   Mouth/Throat:   MMM, no pharyngeal erythema. No peritonsillar mass.    Neck:   No stridor. No SubQ emphysema. No meningismus. Hematological/Lymphatic/Immunilogical:   No cervical lymphadenopathy. Cardiovascular:   RRR. Symmetric bilateral radial and DP pulses.  No murmurs.  Respiratory:   Normal respiratory effort without tachypnea nor retractions. Breath sounds are clear and equal bilaterally. No wheezes/rales/rhonchi. Gastrointestinal:   Soft and nontender. Non distended. There is no CVA tenderness.  No rebound, rigidity, or guarding. Genitourinary:   deferred Musculoskeletal:   Nontender with normal range of motion in all extremities. No joint effusions or tenderness.  No lower extremity tenderness.  No edema. Neurologic:   Normal speech and language.  CN 2-10 normal. Motor grossly intact. No gross focal neurologic deficits  are appreciated.  Skin:    Skin is warm, dry with a diffuse rash on all 4 extremities. There are lesions in different stages of healing that is clearly been scratched. Not consistent with scabies. Not consistent with vasculitis. No petechia purpura or bullae.  ____________________________________________    LABS (pertinent positives/negatives) (all labs ordered are listed, but only abnormal results are displayed) Labs Reviewed  BASIC METABOLIC PANEL - Abnormal; Notable for the following:       Result Value   BUN <5 (*)    Creatinine, Ser  1.05 (*)    All other components within normal limits  URINALYSIS COMPLETEWITH MICROSCOPIC ([facility] ONLY) - Abnormal; Notable for the following:    Color, Urine YELLOW (*)    APPearance CLEAR (*)    Bacteria, UA RARE (*)    Squamous Epithelial / LPF 0-5 (*)    All other components within normal limits  CBC  LIPASE, BLOOD  TROPONIN I  PREGNANCY, URINE  RPR  RAPID HIV SCREEN (HIV 1/2 AB+AG)   ____________________________________________   EKG  Interpreted by me  Date: 11/22/2015  Rate: 84  Rhythm: normal sinus rhythm  QRS Axis: normal  Intervals: normal  ST/T Wave abnormalities: normal  Conduction Disutrbances: none  Narrative Interpretation: unremarkable      ____________________________________________    RADIOLOGY    ____________________________________________   PROCEDURES Procedures  ____________________________________________   INITIAL IMPRESSION / ASSESSMENT AND PLAN / ED COURSE  Pertinent labs & imaging results that were available during my care of the patient were reviewed by me and considered in my medical decision making (see chart for details).  Patient well appearing no acute distress. Was given Zofran on arrival to the ED and feels much better and wants to drink fluids. She appears to be stable and suitable for outpatient follow-up.Considering the patient's symptoms, medical history, and physical  examination today, I have low suspicion for cholecystitis or biliary pathology, pancreatitis, perforation or bowel obstruction, hernia, intra-abdominal abscess, AAA or dissection, volvulus or intussusception, mesenteric ischemia, or appendicitis. No evidence of PID or TOA, and this is essentially anatomically impossible at this point. I will screen her with an RPR and HIV test. If these are negative, the rash is nonspecific, and she should be able to follow-up with her doctor for further evaluation.       Clinical Course    ____________________________________________   FINAL CLINICAL IMPRESSION(S) / ED DIAGNOSES  Final diagnoses:  Gastroenteritis  Skin rash       Portions of this note were generated with dragon dictation software. Dictation errors may occur despite best attempts at proofreading.    Carrie Mew, MD 11/27/15 [phone]

## 2015-11-24 LAB — RPR: RPR: NONREACTIVE

## 2016-02-06 ENCOUNTER — Encounter: Payer: Self-pay | Admitting: Emergency Medicine

## 2016-02-06 ENCOUNTER — Emergency Department: Payer: 59

## 2016-02-06 ENCOUNTER — Emergency Department
Admission: EM | Admit: 2016-02-06 | Discharge: 2016-02-06 | Disposition: A | Discharge status: Home / Self Care | Payer: 59 | Attending: Emergency Medicine | Admitting: Emergency Medicine

## 2016-02-06 DIAGNOSIS — Y999 Unspecified external cause status: Secondary | ICD-10-CM | POA: Insufficient documentation

## 2016-02-06 DIAGNOSIS — Y9351 Activity, roller skating (inline) and skateboarding: Secondary | ICD-10-CM | POA: Insufficient documentation

## 2016-02-06 DIAGNOSIS — S3992XA Unspecified injury of lower back, initial encounter: Secondary | ICD-10-CM | POA: Diagnosis present

## 2016-02-06 DIAGNOSIS — S300XXA Contusion of lower back and pelvis, initial encounter: Secondary | ICD-10-CM | POA: Insufficient documentation

## 2016-02-06 DIAGNOSIS — Z87891 Personal history of nicotine dependence: Secondary | ICD-10-CM | POA: Diagnosis not present

## 2016-02-06 DIAGNOSIS — Y929 Unspecified place or not applicable: Secondary | ICD-10-CM | POA: Diagnosis not present

## 2016-02-06 DIAGNOSIS — J45909 Unspecified asthma, uncomplicated: Secondary | ICD-10-CM | POA: Insufficient documentation

## 2016-02-06 DIAGNOSIS — I10 Essential (primary) hypertension: Secondary | ICD-10-CM | POA: Insufficient documentation

## 2016-02-06 LAB — POCT PREGNANCY, URINE: Preg Test, Ur: NEGATIVE

## 2016-02-06 MED ORDER — MELOXICAM 7.5 MG PO TABS
15.0000 mg | ORAL_TABLET | Freq: Once | ORAL | Status: AC
  Administered 2016-02-06: 15 mg via ORAL
  Filled 2016-02-06: qty 2

## 2016-02-06 MED ORDER — MELOXICAM 15 MG PO TABS: 15.0000 mg | ORAL_TABLET | Freq: Every day | ORAL | 0 refills | Status: DC

## 2016-02-06 NOTE — ED Provider Notes (Signed)
[facility] Emergency Department Provider Note  ____________________________________________  Time seen: Approximately 7:18 PM  I have reviewed the triage vital signs and the nursing notes.   HISTORY  Chief Complaint Back Pain    HPI Barbara Pennington is a 36 y.o. female who presents emergency department complaining of "tailbone" pain. Patient states that she was rollerblading with her daughter when she lost her balance and fell directly on her buttocks. Patient has been having pain to the "tailbone" region since. She denies any radicular symptoms. No bowel or bladder dysfunction, saddle anesthesia, paresthesias. Patient reports that his pain to walk, worse to sit down. She is tried over-the-counter medications with no relief.   Past Medical History:  Diagnosis Date  . Arthritis 2006   rhuematoid - mild - no current issues  . Asthma 2003  . Complication of anesthesia    ran fever after c-section - had infection.  "Usually" has low temp after surgery.  . Hypertension 2013  . Lump or mass in breast 2013   RIGHT BREAST  . Motion sickness    repeated amusement park rides  . Scoliosis    no current issues  . Shortness of breath dyspnea    secondary to weight   . Ulcer ([place]) 2001    Patient Active Problem List   Diagnosis Date Noted  . Hypertension     Past Surgical History:  Procedure Laterality Date  . ABDOMINAL HYSTERECTOMY  2008  . BREAST BIOPSY Left 2013   CORE - FIBROADENOMA VS PHYLLODES  . CESAREAN SECTION  2007  . GUM SURGERY  2004  . PLANTAR FASCIA RELEASE Left 08/19/2014   Procedure: ENDOSCOPIC PLANTAR FASCIOTOMY RELEASE L WITH TOPAZ;  Surgeon: Albertine Patricia, DPM;  Location: [place];  Service: Podiatry;  Laterality: Left;  LMA WITH POPLITEAL BLOCK  . TONSILLECTOMY  1984    Prior to Admission medications   Medication Sig Start Date End Date Taking? Authorizing Provider  albuterol (PROVENTIL HFA;VENTOLIN HFA) 108 (90 Base)  MCG/ACT inhaler Inhale 2 puffs into the lungs every 6 (six) hours as needed for wheezing or shortness of breath. 05/16/15   Johnn Hai, PA-C  meloxicam (MOBIC) 15 MG tablet Take 1 tablet (15 mg total) by mouth daily. 02/06/16   Charline Bills Kylinn Shropshire, PA-C  sertraline (ZOLOFT) 100 MG tablet Take 100 mg by mouth daily. AM    Historical Provider, MD    Allergies Latex  Family History  Problem Relation Age of Onset  . Testicular cancer Brother   . Colon cancer Paternal Grandfather     Social History Social History  Substance Use Topics  . Smoking status: Former Smoker    Packs/day: 0.25    Years: 10.00    Quit date: 07/05/2014  . Smokeless tobacco: Never Used  . Alcohol use Yes     Comment: rare     Review of Systems  Constitutional: No fever/chills Cardiovascular: no chest pain. Respiratory: no cough. No SOB. Musculoskeletal: Positive for "tailbone" pain. Skin: Negative for rash, abrasions, lacerations, ecchymosis. Neurological: Negative for headaches, focal weakness or numbness. 10-point ROS otherwise negative.  ____________________________________________   PHYSICAL EXAM:  VITAL SIGNS: ED Triage Vitals [02/06/16 1829]  Enc Vitals Group     BP 132/68     Pulse Rate 84     Resp      Temp 98.7 F (37.1 C)     Temp Source Oral     SpO2 94 %     Weight  285 lb (129.3 kg)     Height 5\' 4"  (1.626 m)     Head Circumference      Peak Flow      Pain Score 8     Pain Loc      Pain Edu?      Excl. in [place]?      Constitutional: Alert and oriented. Well appearing and in no acute distress. Eyes: Conjunctivae are normal. PERRL. EOMI. Head: Atraumatic. Neck: No stridor.    Cardiovascular: Normal rate, regular rhythm. Normal S1 and S2.  Good peripheral circulation. Respiratory: Normal respiratory effort without tachypnea or retractions. Lungs CTAB. Good air entry to the bases with no decreased or absent breath sounds. Musculoskeletal: Full range of motion to all  extremities. No gross deformities appreciated.No significant ecchymosis noted. Patient is tender to palpation midline coccyx/sacrum region. No palpable abnormality. No step-off. Dorsalis pedis pulse intact bilateral lower extremity's. Sensation intact and equal lower extremities. Neurologic:  Normal speech and language. No gross focal neurologic deficits are appreciated.  Skin:  Skin is warm, dry and intact. No rash noted. Psychiatric: Mood and affect are normal. Speech and behavior are normal. Patient exhibits appropriate insight and judgement.   ____________________________________________   LABS (all labs ordered are listed, but only abnormal results are displayed)  Labs Reviewed  POCT PREGNANCY, URINE  POC URINE PREG, ED   ____________________________________________  EKG   ____________________________________________  RADIOLOGY Diamantina Providence Loralee Weitzman, personally viewed and evaluated these images (plain radiographs) as part of my medical decision making, as well as reviewing the written report by the radiologist.  Dg Sacrum/coccyx  Result Date: 02/06/2016 CLINICAL DATA:  Fall EXAM: SACRUM AND COCCYX - 2+ VIEW COMPARISON:  None. FINDINGS: No acute fracture. No dislocation.  Unremarkable soft tissues. IMPRESSION: No acute bony pathology. Electronically Signed   By: Marybelle Killings M.D.   On: 02/06/2016 19:13    ____________________________________________    PROCEDURES  Procedure(s) performed:    Procedures    Medications  meloxicam (MOBIC) tablet 15 mg (not administered)     ____________________________________________   INITIAL IMPRESSION / ASSESSMENT AND PLAN / ED COURSE  Pertinent labs & imaging results that were available during my care of the patient were reviewed by me and considered in my medical decision making (see chart for details).  Review of the [place] CSRS was performed in accordance of the [place] prior to dispensing any controlled drugs.      Patient's diagnosis is consistent with sacral contusion. Patient has negative x-rays. Exam is reassuring.. Patient will be discharged home with prescriptions for meloxicam for symptom control. Patient is to follow up with care as needed or otherwise directed. Patient is given ED precautions to return to the ED for any worsening or new symptoms.     ____________________________________________  FINAL CLINICAL IMPRESSION(S) / ED DIAGNOSES  Final diagnoses:  Contusion of sacrum, initial encounter      NEW MEDICATIONS STARTED DURING THIS VISIT:  New Prescriptions   MELOXICAM (MOBIC) 15 MG TABLET    Take 1 tablet (15 mg total) by mouth daily.        This chart was dictated using voice recognition software/Dragon. Despite best efforts to proofread, errors can occur which can change the meaning. Any change was purely unintentional.    Darletta Moll, PA-C 02/06/16 1927    Delman Kitten, MD 02/06/16 2320

## 2016-02-06 NOTE — ED Triage Notes (Signed)
Fell yesterday roller skating, tailbone pain.

## 2016-02-06 NOTE — ED Notes (Signed)
See triage note  States she fell while roller skating yesterday  Having pain to tailbone  Ambulates well to treatment room

## 2016-02-10 ENCOUNTER — Emergency Department
Admission: EM | Admit: 2016-02-10 | Discharge: 2016-02-11 | Disposition: A | Discharge status: Home / Self Care | Payer: 59 | Attending: Emergency Medicine | Admitting: Emergency Medicine

## 2016-02-10 ENCOUNTER — Encounter: Payer: Self-pay | Admitting: Emergency Medicine

## 2016-02-10 DIAGNOSIS — R112 Nausea with vomiting, unspecified: Secondary | ICD-10-CM

## 2016-02-10 DIAGNOSIS — Z87891 Personal history of nicotine dependence: Secondary | ICD-10-CM | POA: Diagnosis not present

## 2016-02-10 DIAGNOSIS — R197 Diarrhea, unspecified: Secondary | ICD-10-CM | POA: Diagnosis present

## 2016-02-10 DIAGNOSIS — I10 Essential (primary) hypertension: Secondary | ICD-10-CM | POA: Insufficient documentation

## 2016-02-10 DIAGNOSIS — J45909 Unspecified asthma, uncomplicated: Secondary | ICD-10-CM | POA: Insufficient documentation

## 2016-02-10 LAB — INFLUENZA PANEL BY PCR (TYPE A & B)
INFLAPCR: NEGATIVE
Influenza B By PCR: NEGATIVE

## 2016-02-10 MED ORDER — ONDANSETRON 4 MG PO TBDP
4.0000 mg | ORAL_TABLET | Freq: Once | ORAL | Status: AC
  Administered 2016-02-10: 4 mg via ORAL
  Filled 2016-02-10: qty 1

## 2016-02-10 NOTE — ED Notes (Signed)
Pt presents to ED25 c/o nausea, vomiting and diarrhea that started this morning and has been going on all day; pt states she hasn't been able to keep anything down and whatever she does "drink comes out the other end";

## 2016-02-10 NOTE — ED Triage Notes (Signed)
Pt presents to ED with frequent vomiting and diarrhea all day today. Denies fever. Family member dx with flu recently. Concerned she has flu as well.

## 2016-02-11 LAB — COMPREHENSIVE METABOLIC PANEL
ALBUMIN: 4.3 g/dL (ref 3.5–5.0)
ALT: 25 U/L (ref 14–54)
AST: 18 U/L (ref 15–41)
Alkaline Phosphatase: 46 U/L (ref 38–126)
Anion gap: 9 (ref 5–15)
BUN: 13 mg/dL (ref 6–20)
CHLORIDE: 108 mmol/L (ref 101–111)
CO2: 23 mmol/L (ref 22–32)
CREATININE: 0.9 mg/dL (ref 0.44–1.00)
Calcium: 8.5 mg/dL — ABNORMAL LOW (ref 8.9–10.3)
GFR calc Af Amer: >60 mL/min (ref >60)
GLUCOSE: 114 mg/dL — AB (ref 65–99)
POTASSIUM: 4.1 mmol/L (ref 3.5–5.1)
SODIUM: 140 mmol/L (ref 135–145)
Total Bilirubin: 0.7 mg/dL (ref 0.3–1.2)
Total Protein: 7.1 g/dL (ref 6.5–8.1)

## 2016-02-11 LAB — CBC WITH DIFFERENTIAL/PLATELET
BASOS ABS: 0 10*3/uL (ref 0–0.1)
BASOS PCT: 0 %
EOS PCT: 2 %
Eosinophils Absolute: 0.2 10*3/uL (ref 0–0.7)
HCT: 40.3 % (ref 35.0–47.0)
Hemoglobin: 13.6 g/dL (ref 12.0–16.0)
LYMPHS PCT: 8 %
Lymphs Abs: 0.6 10*3/uL — ABNORMAL LOW (ref 1.0–3.6)
MCH: 30.9 pg (ref 26.0–34.0)
MCHC: 33.8 g/dL (ref 32.0–36.0)
MCV: 91.5 fL (ref 80.0–100.0)
MONO ABS: 0.4 10*3/uL (ref 0.2–0.9)
Monocytes Relative: 5 %
NEUTROS ABS: 6.9 10*3/uL — AB (ref 1.4–6.5)
Neutrophils Relative %: 85 %
PLATELETS: 173 10*3/uL (ref 150–440)
RBC: 4.41 MIL/uL (ref 3.80–5.20)
RDW: 13.5 % (ref 11.5–14.5)
WBC: 8.1 10*3/uL (ref 3.6–11.0)

## 2016-02-11 LAB — URINALYSIS, COMPLETE (UACMP) WITH MICROSCOPIC
BACTERIA UA: NONE SEEN
BILIRUBIN URINE: NEGATIVE
Glucose, UA: NEGATIVE mg/dL
Hgb urine dipstick: NEGATIVE
KETONES UR: NEGATIVE mg/dL
LEUKOCYTES UA: NEGATIVE
Nitrite: NEGATIVE
PH: 7 (ref 5.0–8.0)
PROTEIN: 30 mg/dL — AB
Specific Gravity, Urine: 1.028 (ref 1.005–1.030)

## 2016-02-11 LAB — LIPASE, BLOOD: LIPASE: 33 U/L (ref 11–51)

## 2016-02-11 LAB — POCT PREGNANCY, URINE: Preg Test, Ur: NEGATIVE

## 2016-02-11 MED ORDER — SODIUM CHLORIDE 0.9 % IV BOLUS (SEPSIS)
1000.0000 mL | Freq: Once | INTRAVENOUS | Status: AC
  Administered 2016-02-11: 1000 mL via INTRAVENOUS

## 2016-02-11 MED ORDER — ONDANSETRON HCL 4 MG/2ML IJ SOLN
4.0000 mg | Freq: Once | INTRAMUSCULAR | Status: AC
Start: 2016-02-11 — End: 2016-02-11
  Administered 2016-02-11: 4 mg via INTRAVENOUS
  Filled 2016-02-11: qty 2

## 2016-02-11 MED ORDER — ONDANSETRON 4 MG PO TBDP: 4.0000 mg | ORAL_TABLET | Freq: Three times a day (TID) | ORAL | 0 refills | Status: DC | PRN

## 2016-02-11 NOTE — ED Provider Notes (Signed)
[facility] Emergency Department Provider Note   ____________________________________________   First MD Initiated Contact with Patient 02/10/16 2340     (approximate)  I have reviewed the triage vital signs and the nursing notes.   HISTORY  Chief Complaint Emesis and Diarrhea    HPI Barbara Pennington is a 36 y.o. female who presents to the ED from home with a chief complaint of nausea, vomiting and diarrhea.Patient's daughter had the same symptoms last week, and this week her daughter has influenza. Patient is also concerned she has influenza. N/V/D all day today without associated abdominal pain. Denies fever, chills, chest pain, shortness of breath, dysuria. Denies recent travel or trauma. Nothing makes her symptoms better or worse.   Past Medical History:  Diagnosis Date  . Arthritis 2006   rhuematoid - mild - no current issues  . Asthma 2003  . Complication of anesthesia    ran fever after c-section - had infection.  "Usually" has low temp after surgery.  . Hypertension 2013  . Lump or mass in breast 2013   RIGHT BREAST  . Motion sickness    repeated amusement park rides  . Scoliosis    no current issues  . Shortness of breath dyspnea    secondary to weight   . Ulcer ([place]) 2001    Patient Active Problem List   Diagnosis Date Noted  . Hypertension     Past Surgical History:  Procedure Laterality Date  . ABDOMINAL HYSTERECTOMY  2008  . BREAST BIOPSY Left 2013   CORE - FIBROADENOMA VS PHYLLODES  . CESAREAN SECTION  2007  . GUM SURGERY  2004  . PLANTAR FASCIA RELEASE Left 08/19/2014   Procedure: ENDOSCOPIC PLANTAR FASCIOTOMY RELEASE L WITH TOPAZ;  Surgeon: Albertine Patricia, DPM;  Location: [place];  Service: Podiatry;  Laterality: Left;  LMA WITH POPLITEAL BLOCK  . TONSILLECTOMY  1984    Prior to Admission medications   Medication Sig Start Date End Date Taking? Authorizing Provider  albuterol (PROVENTIL HFA;VENTOLIN HFA)  108 (90 Base) MCG/ACT inhaler Inhale 2 puffs into the lungs every 6 (six) hours as needed for wheezing or shortness of breath. 05/16/15   Johnn Hai, PA-C  meloxicam (MOBIC) 15 MG tablet Take 1 tablet (15 mg total) by mouth daily. 02/06/16   Charline Bills Cuthriell, PA-C  sertraline (ZOLOFT) 100 MG tablet Take 100 mg by mouth daily. AM    Historical Provider, MD    Allergies Latex  Family History  Problem Relation Age of Onset  . Testicular cancer Brother   . Colon cancer Paternal Grandfather     Social History Social History  Substance Use Topics  . Smoking status: Former Smoker    Packs/day: 0.25    Years: 10.00    Quit date: 07/05/2014  . Smokeless tobacco: Never Used  . Alcohol use Yes     Comment: rare    Review of Systems  Constitutional: No fever/chills. Eyes: No visual changes. ENT: No sore throat. Cardiovascular: Denies chest pain. Respiratory: Denies shortness of breath. Gastrointestinal: No abdominal pain.  Positive for nausea, vomiting and diarrhea.  No constipation. Genitourinary: Negative for dysuria. Musculoskeletal: Negative for back pain. Skin: Negative for rash. Neurological: Negative for headaches, focal weakness or numbness.  10-point ROS otherwise negative.  ____________________________________________   PHYSICAL EXAM:  VITAL SIGNS: ED Triage Vitals [02/10/16 2218]  Enc Vitals Group     BP 123/74     Pulse Rate 97  Resp 18     Temp 98.5 F (36.9 C)     Temp Source Oral     SpO2 98 %     Weight 285 lb (129.3 kg)     Height 5\' 5"  (1.651 m)     Head Circumference      Peak Flow      Pain Score 5     Pain Loc      Pain Edu?      Excl. in [place]?     Constitutional: Alert and oriented. Well appearing and in mild acute distress. Eyes: Conjunctivae are normal. PERRL. EOMI. Head: Atraumatic. Nose: No congestion/rhinnorhea. Mouth/Throat: Mucous membranes are mildly dry.  Oropharynx non-erythematous. Neck: No stridor.   Cardiovascular:  Normal rate, regular rhythm. Grossly normal heart sounds.  Good peripheral circulation. Respiratory: Normal respiratory effort.  No retractions. Lungs CTAB. Gastrointestinal: Soft and nontender to light or deep palpation. No distention. No abdominal bruits. No CVA tenderness. Musculoskeletal: No lower extremity tenderness nor edema.  No joint effusions. Neurologic:  Normal speech and language. No gross focal neurologic deficits are appreciated. No gait instability. Skin:  Skin is warm, and intact. No rash noted. Psychiatric: Mood and affect are normal. Speech and behavior are normal.  ____________________________________________   LABS (all labs ordered are listed, but only abnormal results are displayed)  Labs Reviewed  CBC WITH DIFFERENTIAL/PLATELET - Abnormal; Notable for the following:       Result Value   Neutro Abs 6.9 (*)    Lymphs Abs 0.6 (*)    All other components within normal limits  COMPREHENSIVE METABOLIC PANEL - Abnormal; Notable for the following:    Glucose, Bld 114 (*)    Calcium 8.5 (*)    All other components within normal limits  URINALYSIS, COMPLETE (UACMP) WITH MICROSCOPIC - Abnormal; Notable for the following:    Color, Urine YELLOW (*)    APPearance CLEAR (*)    Protein, ur 30 (*)    Squamous Epithelial / LPF 0-5 (*)    All other components within normal limits  INFLUENZA PANEL BY PCR (TYPE A & B)  LIPASE, BLOOD  POC URINE PREG, ED  POCT PREGNANCY, URINE   ____________________________________________  EKG  None ____________________________________________  RADIOLOGY  None ____________________________________________   PROCEDURES  Procedure(s) performed: None  Procedures  Critical Care performed: No  ____________________________________________   INITIAL IMPRESSION / ASSESSMENT AND PLAN / ED COURSE  Pertinent labs & imaging results that were available during my care of the patient were reviewed by me and considered in my medical  decision making (see chart for details).  36 year old female who presents with nausea, vomiting and diarrhea. Influenza swab is negative. Will obtain screening lab work, initiate IV fluid resuscitation, IV antiemetic and reassess.  Clinical Course as of Feb 11 452  Sat Feb 11, 2016  0226 IV fluids infusing, nausea improved. Patient crunching on ice chips. Awaiting urine specimen.  [JS]  [id number] Updated patient of urinalysis results. She has tolerated PO without emesis. Strict return precautions given. Patient verbalizes understanding and agrees with plan of care.  [JS]    Clinical Course User Index [JS] Paulette Blanch, MD     ____________________________________________   FINAL CLINICAL IMPRESSION(S) / ED DIAGNOSES  Final diagnoses:  Nausea vomiting and diarrhea      NEW MEDICATIONS STARTED DURING THIS VISIT:  New Prescriptions   No medications on file     Note:  This document was prepared using Dragon voice recognition software and  may include unintentional dictation errors.    Paulette Blanch, MD 02/11/16 [phone]

## 2016-02-11 NOTE — ED Notes (Signed)
Pt asked for something to drink, per MD order pt given ice chips; pt's daughter asked for something to eat and was given graham crackers and peanut butter.

## 2016-02-11 NOTE — Discharge Instructions (Signed)
1. You may take Zofran as needed for nausea/vomiting. 2. Clear liquids 12 hours, then BRAT diet 3 days. Then slowly advance diet as tolerated. 3. Return to the ER for worsening symptoms, persistent vomiting, difficulty breathing or other concerns.

## 2016-02-11 NOTE — ED Notes (Signed)

## 2016-08-10 ENCOUNTER — Other Ambulatory Visit: Payer: Self-pay | Admitting: Emergency Medicine

## 2016-09-28 ENCOUNTER — Emergency Department
Admission: EM | Admit: 2016-09-28 | Discharge: 2016-09-28 | Disposition: A | Discharge status: Home / Self Care | Payer: 59 | Attending: Emergency Medicine | Admitting: Emergency Medicine

## 2016-09-28 DIAGNOSIS — K625 Hemorrhage of anus and rectum: Secondary | ICD-10-CM | POA: Insufficient documentation

## 2016-09-28 DIAGNOSIS — Z91040 Latex allergy status: Secondary | ICD-10-CM | POA: Insufficient documentation

## 2016-09-28 DIAGNOSIS — Z87891 Personal history of nicotine dependence: Secondary | ICD-10-CM | POA: Insufficient documentation

## 2016-09-28 DIAGNOSIS — I10 Essential (primary) hypertension: Secondary | ICD-10-CM | POA: Insufficient documentation

## 2016-09-28 DIAGNOSIS — J45909 Unspecified asthma, uncomplicated: Secondary | ICD-10-CM | POA: Insufficient documentation

## 2016-09-28 HISTORY — DX: Anxiety disorder, unspecified: F41.9

## 2016-09-28 LAB — COMPREHENSIVE METABOLIC PANEL
ALBUMIN: 4.4 g/dL (ref 3.5–5.0)
ALT: 22 U/L (ref 14–54)
AST: 16 U/L (ref 15–41)
Alkaline Phosphatase: 43 U/L (ref 38–126)
Anion gap: 8 (ref 5–15)
BILIRUBIN TOTAL: 0.4 mg/dL (ref 0.3–1.2)
BUN: 12 mg/dL (ref 6–20)
CHLORIDE: 102 mmol/L (ref 101–111)
CO2: 26 mmol/L (ref 22–32)
CREATININE: 1 mg/dL (ref 0.44–1.00)
Calcium: 9.3 mg/dL (ref 8.9–10.3)
GFR calc Af Amer: >60 mL/min (ref >60)
GFR calc non Af Amer: >60 mL/min (ref >60)
GLUCOSE: 100 mg/dL — AB (ref 65–99)
POTASSIUM: 3.7 mmol/L (ref 3.5–5.1)
Sodium: 136 mmol/L (ref 135–145)
Total Protein: 7.3 g/dL (ref 6.5–8.1)

## 2016-09-28 LAB — TYPE AND SCREEN
ABO/RH(D): A NEG
Antibody Screen: NEGATIVE

## 2016-09-28 LAB — CBC
HEMATOCRIT: 42.5 % (ref 35.0–47.0)
Hemoglobin: 14.4 g/dL (ref 12.0–16.0)
MCH: 31.2 pg (ref 26.0–34.0)
MCHC: 33.7 g/dL (ref 32.0–36.0)
MCV: 92.3 fL (ref 80.0–100.0)
Platelets: 223 10*3/uL (ref 150–440)
RBC: 4.61 MIL/uL (ref 3.80–5.20)
RDW: 13 % (ref 11.5–14.5)
WBC: 7.9 10*3/uL (ref 3.6–11.0)

## 2016-09-28 NOTE — ED Notes (Addendum)
Pt alert and oriented X4, active, cooperative, pt in NAD. RR even and unlabored, color WNL.  Pt informed to return if any life threatening symptoms occur.  Discharge and followup instructions reviewed.  

## 2016-09-28 NOTE — ED Notes (Signed)
Bright red BM since Tuesday. Denies pain, NVD. Pt states that every time she has BM she has bright red stool. Pt alert and oriented X4, active, cooperative, pt in NAD. RR even and unlabored, color WNL.

## 2016-09-28 NOTE — Discharge Instructions (Signed)
Please make an appointment with the GI doctor for follow-up about your rectal bleeding.  Return to the emergency department if you have increased bleeding, in, pain when you have a bowel movement, or any other symptoms concerning to you.

## 2016-09-28 NOTE — ED Triage Notes (Signed)
Pt presents to ED with c/o rectal bleeding since Tuesday. Noticed clots on Tuesday. States bright red blood. Never had this before. Has NOT had any rectal procedures recently. Denies taking blood thinners. Does not appear in any distress at this time. Alert, oriented, ambulatory.

## 2016-09-28 NOTE — ED Provider Notes (Signed)
[facility] Emergency Department Provider Note  ____________________________________________  Time seen: Approximately 8:42 PM  I have reviewed the triage vital signs and the nursing notes.   HISTORY  Chief Complaint Rectal Bleeding    HPI Barbara Pennington is a 36 y.o. female with a family history of colon cancer presenting with bright red blood per rectum. The patient reports that since yesterday, she has had blood streaks mixed with normal bowel movements. She denies straining or constipation. No diarrhea. No nausea or vomiting, abdominal pain, fever or chills. No shortness of breath, lightheadedness, syncope.   Past Medical History:  Diagnosis Date  . Anxiety   . Arthritis 2006   rhuematoid - mild - no current issues  . Asthma 2003  . Complication of anesthesia    ran fever after c-section - had infection.  "Usually" has low temp after surgery.  . Hypertension 2013  . Lump or mass in breast 2013   RIGHT BREAST  . Motion sickness    repeated amusement park rides  . Scoliosis    no current issues  . Shortness of breath dyspnea    secondary to weight   . Ulcer 2001    Patient Active Problem List   Diagnosis Date Noted  . Hypertension     Past Surgical History:  Procedure Laterality Date  . ABDOMINAL HYSTERECTOMY  2008  . BREAST BIOPSY Left 2013   CORE - FIBROADENOMA VS PHYLLODES  . CESAREAN SECTION  2007  . GUM SURGERY  2004  . PLANTAR FASCIA RELEASE Left 08/19/2014   Procedure: ENDOSCOPIC PLANTAR FASCIOTOMY RELEASE L WITH TOPAZ;  Surgeon: Albertine Patricia, DPM;  Location: [place];  Service: Podiatry;  Laterality: Left;  LMA WITH POPLITEAL BLOCK  . TONSILLECTOMY  1984    Current Outpatient Rx  . Order #: [id number] Class: Print  . Order #: [id number] Class: Print  . Order #: [id number] Class: Print  . Order #: [id number] Class: Historical Med    Allergies Latex  Family History  Problem Relation Age of Onset  . Testicular  cancer Brother   . Colon cancer Paternal Grandfather     Social History Social History  Substance Use Topics  . Smoking status: Former Smoker    Packs/day: 0.25    Years: 10.00    Quit date: 07/05/2014  . Smokeless tobacco: Never Used  . Alcohol use Yes     Comment: rare    Review of Systems Constitutional: No fever/chills.no lightheadedness or syncope. Eyes: No visual changes. ENT: No sore throat. No congestion or rhinorrhea. Cardiovascular: Denies chest pain. Denies palpitations. Respiratory: Denies shortness of breath.  No cough. Gastrointestinal: No abdominal pain.  No nausea, no vomiting.  No diarrhea.  No constipation.positive bright red blood per rectum. No pain with defec Genitourinary: Negative for dysuria. Musculoskeletal: Negative for back pain. Skin: Negative for rash. Neurological: Negative for headaches. No focal numbness, tingling or weakness.     ____________________________________________   PHYSICAL EXAM:  VITAL SIGNS: ED Triage Vitals  Enc Vitals Group     BP 09/28/16 1756 (!) 148/91     Pulse Rate 09/28/16 1756 72     Resp 09/28/16 1756 18     Temp 09/28/16 1756 98.9 F (37.2 C)     Temp Source 09/28/16 1756 Oral     SpO2 09/28/16 1756 96 %     Weight --      Height --      Head Circumference --      Peak Flow --  Pain Score 09/28/16 1757 0     Pain Loc --      Pain Edu? --      Excl. in [place]? --     Constitutional: Alert and oriented. Morbidly obese.Well appearing and in no acute distress. Answers questions appropriately. Eyes: Conjunctivae are normaland without pallor.  EOMI. No scleral icterus. Head: Atraumatic. Nose: No congestion/rhinnorhea. Mouth/Throat: Mucous membranes are moist.  Neck: No stridor.  Supple.   Cardiovascular: Normal rate, regular rhythm. No murmurs, rubs or gallops.  Respiratory: Normal respiratory effort.  No accessory muscle use or retractions. Lungs CTAB.  No wheezes, rales or ronchi. Gastrointestinal:  obese.Soft, nontender and nondistended.  No guarding or rebound.  No peritoneal signs. Genitourinary: Multiple nonthrombosed, nonbleeding external hemorrhoids without palpable internal hemorrhoids. The patient has brown stool which is guaiac positive. Musculoskeletal: No LE edema. Neurologic:  A&Ox3.  Speech is clear.  Face and smile are symmetric.  EOMI.  Moves all extremities well. Skin:  Skin is warm, dry and intact. No rash noted. Psychiatric: Mood and affect are normal. Speech and behavior are normal.  Normal judgement.  ____________________________________________   LABS (all labs ordered are listed, but only abnormal results are displayed)  Labs Reviewed  COMPREHENSIVE METABOLIC PANEL - Abnormal; Notable for the following:       Result Value   Glucose, Bld 100 (*)    All other components within normal limits  CBC  POC OCCULT BLOOD, ED  TYPE AND SCREEN   ____________________________________________  EKG  Not indicated ____________________________________________  RADIOLOGY  No results found.  ____________________________________________   PROCEDURES  Procedure(s) performed: None  Procedures  Critical Care performed: No ____________________________________________   INITIAL IMPRESSION / ASSESSMENT AND PLAN / ED COURSE  Pertinent labs & imaging results that were available during my care of the patient were reviewed by me and considered in my medical decision making (see chart for details).  36 y.o. email with blood streaks mixed with stool over the past 2 days, without any systemic symptoms. Patient is hemodynamically stable. Her hemoglobin and hematocrit are very reassuring at 14 and 42.5. On my examination, she has brown stool.  It is possible that she has bleeding hemorrhoids, although she does not give a hx of straining or pain. Diverticulitis is very unlikely.  I am concerned about her FH of colon ca, and will refer her to outpatient GI for further  evaluation.  At this time, no additional imaging is indicated and the pt is safe for d/c home.  Return precautions as well as f/u instructions were discussed.  ____________________________________________  FINAL CLINICAL IMPRESSION(S) / ED DIAGNOSES  Final diagnoses:  Bright red blood per rectum         NEW MEDICATIONS STARTED DURING THIS VISIT:  New Prescriptions   No medications on file      Eula Listen, MD 09/28/16 2047

## 2016-10-03 ENCOUNTER — Ambulatory Visit (INDEPENDENT_AMBULATORY_CARE_PROVIDER_SITE_OTHER): Payer: Self-pay | Admitting: Gastroenterology

## 2016-10-03 ENCOUNTER — Encounter: Payer: Self-pay | Admitting: Gastroenterology

## 2016-10-03 VITALS — BP 138/87 | HR 66 | Temp 98.5°F | Ht 64.5 in | Wt 281.0 lb

## 2016-10-03 DIAGNOSIS — K625 Hemorrhage of anus and rectum: Secondary | ICD-10-CM

## 2016-10-03 NOTE — Progress Notes (Signed)
Jonathon Bellows MD, MRCP(U.K) [address]  [place]  [place], [place] [address]  Main: [phone]  Fax: [phone]   Gastroenterology Consultation  Referring Provider:     ER  Primary Care Physician:   Primary Gastroenterologist:  Dr. Jonathon Bellows  Reason for Consultation:     Blood in stool         HPI:   Barbara Pennington is a 36 y.o. y/o female . She has been referred from the ER for rectal bleeding . She presented to the ER on 09/28/16 with rectal bleeding of 1 day duration with a normal Hb .   Rectal bleeding :  Onset and where was blood seen  :Last Tuesday - since then it did stop on Monday and restarted  Frequency of bowel movements :5 times a day  Consistency : formed , blood is when she wipes and at times seen in the toilet bowel  Change in shape of stool:no  Pain associated with bowel movements:no  Blood thinner usage:no  NSAID's: no  Prior colonoscopy :no  Family history of colon cancer or polyps:Her grandfather had colon cancer,  Weight loss:no   No perianal itching .   Past Medical History:  Diagnosis Date  . Anxiety   . Arthritis 2006   rhuematoid - mild - no current issues  . Asthma 2003  . Complication of anesthesia    ran fever after c-section - had infection.  "Usually" has low temp after surgery.  . Hypertension 2013  . Lump or mass in breast 2013   RIGHT BREAST  . Motion sickness    repeated amusement park rides  . Scoliosis    no current issues  . Shortness of breath dyspnea    secondary to weight   . Ulcer 2001    Past Surgical History:  Procedure Laterality Date  . ABDOMINAL HYSTERECTOMY  2008  . BREAST BIOPSY Left 2013   CORE - FIBROADENOMA VS PHYLLODES  . CESAREAN SECTION  2007  . GUM SURGERY  2004  . PLANTAR FASCIA RELEASE Left 08/19/2014   Procedure: ENDOSCOPIC PLANTAR FASCIOTOMY RELEASE L WITH TOPAZ;  Surgeon: Albertine Patricia, DPM;  Location: [facility];  Service: Podiatry;  Laterality: Left;  LMA WITH POPLITEAL BLOCK   . TONSILLECTOMY  1984    Prior to Admission medications   Medication Sig Start Date End Date Taking? Authorizing Provider  acetaminophen (TYLENOL) 325 MG tablet Take by mouth.    [provider]  albuterol (PROVENTIL HFA;VENTOLIN HFA) 108 (90 Base) MCG/ACT inhaler Inhale 2 puffs into the lungs every 6 (six) hours as needed for wheezing or shortness of breath. 05/16/15   Johnn Hai, PA-C  escitalopram (LEXAPRO) 10 MG tablet Take by mouth.    [provider]  meloxicam (MOBIC) 15 MG tablet Take 1 tablet (15 mg total) by mouth daily. 02/06/16   Cuthriell, Charline Bills, PA-C  ondansetron (ZOFRAN ODT) 4 MG disintegrating tablet Take 1 tablet (4 mg total) by mouth every 8 (eight) hours as needed for nausea or vomiting. 02/11/16   Paulette Blanch, MD  sertraline (ZOLOFT) 100 MG tablet Take 100 mg by mouth daily. AM    [provider]    Family History  Problem Relation Age of Onset  . Testicular cancer Brother   . Colon cancer Paternal Grandfather      Social History  Substance Use Topics  . Smoking status: Former Smoker    Packs/day: 0.25    Years: 10.00  Quit date: 07/05/2014  . Smokeless tobacco: Never Used  . Alcohol use Yes     Comment: rare    Allergies as of 10/03/2016 - Review Complete 09/28/2016  Allergen Reaction Noted  . Latex Rash 08/13/2014   There were no vitals taken for this visit.  Review of Systems:    All systems reviewed and negative except where noted in HPI.   Physical Exam:  There were no vitals taken for this visit. No LMP recorded. Patient has had a hysterectomy. Psych:  Alert and cooperative. Normal mood and affect. General:   Alert,  Well-developed, well-nourished, pleasant and cooperative in NAD Head:  Normocephalic and atraumatic. Eyes:  Sclera clear, no icterus.   Conjunctiva pink. Ears:  Normal auditory acuity. Nose:  No deformity, discharge, or lesions. Mouth:  No deformity or lesions,oropharynx pink & moist. Neck:   Supple; no masses or thyromegaly. Lungs:  Respirations even and unlabored.  Clear throughout to auscultation.   No wheezes, crackles, or rhonchi. No acute distress. Heart:  Regular rate and rhythm; no murmurs, clicks, rubs, or gallops. Abdomen:  Normal bowel sounds.  No bruits.  Soft, non-tender and non-distended without masses, hepatosplenomegaly or hernias noted.  No guarding or rebound tenderness.    Neurologic:  Alert and oriented x3;  grossly normal neurologically. Skin:  Intact without significant lesions or rashes. No jaundice. Lymph Nodes:  No significant cervical adenopathy. Psych:  Alert and cooperative. Normal mood and affect.  Imaging Studies: No results found.  Assessment and Plan:   Barbara Pennington is a 36 y.o. y/o female has been referred for rectal bleeding .    1. Diagnostic colonoscopy   I have discussed alternative options, risks & benefits,  which include, but are not limited to, bleeding, infection, perforation,respiratory complication & drug reaction.  The patient agrees with this plan & written consent will be obtained.   Follow up PRN  Dr Jonathon Bellows MD,MRCP(U.K)

## 2016-10-09 ENCOUNTER — Ambulatory Visit: Payer: Self-pay | Admitting: Anesthesiology

## 2016-10-09 ENCOUNTER — Encounter
Admission: RE | Disposition: A | Discharge status: Home / Self Care | Payer: Self-pay | Source: Ambulatory Visit | Attending: Gastroenterology

## 2016-10-09 ENCOUNTER — Ambulatory Visit
Admission: RE | Admit: 2016-10-09 | Discharge: 2016-10-09 | Disposition: A | Discharge status: Home / Self Care | Payer: Self-pay | Source: Ambulatory Visit | Attending: Gastroenterology | Admitting: Gastroenterology

## 2016-10-09 DIAGNOSIS — Z91040 Latex allergy status: Secondary | ICD-10-CM | POA: Insufficient documentation

## 2016-10-09 DIAGNOSIS — D125 Benign neoplasm of sigmoid colon: Secondary | ICD-10-CM | POA: Insufficient documentation

## 2016-10-09 DIAGNOSIS — F419 Anxiety disorder, unspecified: Secondary | ICD-10-CM | POA: Insufficient documentation

## 2016-10-09 DIAGNOSIS — D122 Benign neoplasm of ascending colon: Secondary | ICD-10-CM | POA: Insufficient documentation

## 2016-10-09 DIAGNOSIS — Z6841 Body Mass Index (BMI) 40.0 and over, adult: Secondary | ICD-10-CM | POA: Insufficient documentation

## 2016-10-09 DIAGNOSIS — K625 Hemorrhage of anus and rectum: Secondary | ICD-10-CM | POA: Insufficient documentation

## 2016-10-09 DIAGNOSIS — K640 First degree hemorrhoids: Secondary | ICD-10-CM | POA: Insufficient documentation

## 2016-10-09 DIAGNOSIS — Z79899 Other long term (current) drug therapy: Secondary | ICD-10-CM | POA: Insufficient documentation

## 2016-10-09 DIAGNOSIS — J45909 Unspecified asthma, uncomplicated: Secondary | ICD-10-CM | POA: Insufficient documentation

## 2016-10-09 DIAGNOSIS — M419 Scoliosis, unspecified: Secondary | ICD-10-CM | POA: Insufficient documentation

## 2016-10-09 DIAGNOSIS — I10 Essential (primary) hypertension: Secondary | ICD-10-CM | POA: Insufficient documentation

## 2016-10-09 DIAGNOSIS — D123 Benign neoplasm of transverse colon: Secondary | ICD-10-CM | POA: Insufficient documentation

## 2016-10-09 DIAGNOSIS — Z87891 Personal history of nicotine dependence: Secondary | ICD-10-CM | POA: Insufficient documentation

## 2016-10-09 HISTORY — PX: COLONOSCOPY WITH PROPOFOL: SHX5780

## 2016-10-09 SURGERY — COLONOSCOPY WITH PROPOFOL
Anesthesia: General

## 2016-10-09 MED ORDER — MIDAZOLAM HCL 2 MG/2ML IJ SOLN
INTRAMUSCULAR | Status: DC | PRN
  Administered 2016-10-09: 2 mg via INTRAVENOUS

## 2016-10-09 MED ORDER — FENTANYL CITRATE (PF) 100 MCG/2ML IJ SOLN
INTRAMUSCULAR | Status: AC
  Filled 2016-10-09: qty 2

## 2016-10-09 MED ORDER — SUGAMMADEX SODIUM 200 MG/2ML IV SOLN
INTRAVENOUS | Status: AC
  Filled 2016-10-09: qty 2

## 2016-10-09 MED ORDER — EPINEPHRINE PF 1 MG/10ML IJ SOSY
PREFILLED_SYRINGE | INTRAMUSCULAR | Status: AC
  Filled 2016-10-09: qty 10

## 2016-10-09 MED ORDER — LIDOCAINE HCL (PF) 2 % IJ SOLN
INTRAMUSCULAR | Status: AC
  Filled 2016-10-09: qty 10

## 2016-10-09 MED ORDER — PROPOFOL 10 MG/ML IV BOLUS
INTRAVENOUS | Status: DC | PRN
  Administered 2016-10-09: 30 mg via INTRAVENOUS

## 2016-10-09 MED ORDER — MIDAZOLAM HCL 2 MG/2ML IJ SOLN
INTRAMUSCULAR | Status: AC
  Filled 2016-10-09: qty 2

## 2016-10-09 MED ORDER — PROPOFOL 500 MG/50ML IV EMUL
INTRAVENOUS | Status: DC | PRN
  Administered 2016-10-09: 120 ug/kg/min via INTRAVENOUS

## 2016-10-09 MED ORDER — PROPOFOL 500 MG/50ML IV EMUL
INTRAVENOUS | Status: AC
  Filled 2016-10-09: qty 50

## 2016-10-09 MED ORDER — FENTANYL CITRATE (PF) 100 MCG/2ML IJ SOLN
INTRAMUSCULAR | Status: DC | PRN
  Administered 2016-10-09 (×2): 50 ug via INTRAVENOUS

## 2016-10-09 MED ORDER — SODIUM CHLORIDE 0.9 % IV SOLN
INTRAVENOUS | Status: DC
  Administered 2016-10-09: 08:00:00 via INTRAVENOUS

## 2016-10-09 NOTE — Anesthesia Post-op Follow-up Note (Signed)
Anesthesia QCDR form completed.        

## 2016-10-09 NOTE — Op Note (Signed)
[facility] Gastroenterology Patient Name: Barbara Pennington Procedure Date: 10/09/2016 7:50 AM MRN: [id number] Account #: [id number] Date of Birth: 1980-04-09 Admit Type: Outpatient Age: 36 Room: [facility] ENDO ROOM 1 Gender: Female Note Status: Finalized Procedure:            Colonoscopy Indications:          Rectal bleeding Providers:            Jonathon Bellows MD, MD Referring MD:         No Local Md, MD (Referring MD) Medicines:            Monitored Anesthesia Care Complications:        No immediate complications. Procedure:            Pre-Anesthesia Assessment:                       - Prior to the procedure, a History and Physical was                        performed, and patient medications, allergies and                        sensitivities were reviewed. The patient's tolerance of                        previous anesthesia was reviewed.                       - The risks and benefits of the procedure and the                        sedation options and risks were discussed with the                        patient. All questions were answered and informed                        consent was obtained.                       - ASA Grade Assessment: II - A patient with mild                        systemic disease.                       After obtaining informed consent, the colonoscope was                        passed under direct vision. Throughout the procedure,                        the patient's blood pressure, pulse, and oxygen                        saturations were monitored continuously. The                        Colonoscope was introduced through the anus and                        advanced  to the the cecum, identified by the                        appendiceal orifice, IC valve and transillumination.                        The colonoscopy was performed with ease. The patient                        tolerated the procedure well. The quality of the bowel          preparation was good. Findings:      The perianal and digital rectal examinations were normal.      Two sessile polyps were found in the sigmoid colon and transverse colon.       The polyps were 5 to 7 mm in size. These polyps were removed with a cold       snare. Resection and retrieval were complete.      A 3 mm polyp was found in the ascending colon. The polyp was sessile.       The polyp was removed with a cold biopsy forceps. Resection and       retrieval were complete.      Non-bleeding internal hemorrhoids were found during retroflexion. The       hemorrhoids were medium-sized and Grade I (internal hemorrhoids that do       not prolapse).      The exam was otherwise without abnormality on direct and retroflexion       views. Impression:           - Two 5 to 7 mm polyps in the sigmoid colon and in the                        transverse colon, removed with a cold snare. Resected                        and retrieved.                       - One 3 mm polyp in the ascending colon, removed with a                        cold biopsy forceps. Resected and retrieved.                       - Non-bleeding internal hemorrhoids.                       - The examination was otherwise normal on direct and                        retroflexion views. Recommendation:       - Discharge patient to home (with escort).                       - Resume previous diet.                       - Continue present medications.                       - Await pathology results.                       -  Repeat colonoscopy for surveillance based on                        pathology results.                       - 1. Schedule office visit with Dr Marius Ditch in 4 weeks to                        discuss about hemorroidal banding Procedure Code(s):    --- Professional ---                       [phone], Colonoscopy, flexible; with removal of tumor(s),                        polyp(s), or other lesion(s) by snare technique                        06301, 9, Colonoscopy, flexible; with biopsy, single                        or multiple Diagnosis Code(s):    --- Professional ---                       D12.5, Benign neoplasm of sigmoid colon                       D12.3, Benign neoplasm of transverse colon (hepatic                        flexure or splenic flexure)                       D12.2, Benign neoplasm of ascending colon                       K64.0, First degree hemorrhoids                       K62.5, Hemorrhage of anus and rectum CPT copyright 2016 American Medical Association. All rights reserved. The codes documented in this report are preliminary and upon coder review may  be revised to meet current compliance requirements. Jonathon Bellows, MD Jonathon Bellows MD, MD 10/09/2016 8:27:49 AM This report has been signed electronically. Number of Addenda: 0 Note Initiated On: 10/09/2016 7:50 AM Scope Withdrawal Time: 0 hours 11 minutes 57 seconds  Total Procedure Duration: 0 hours 17 minutes 24 seconds       [facility]

## 2016-10-09 NOTE — Anesthesia Preprocedure Evaluation (Signed)
Anesthesia Evaluation  Patient identified by MRN, date of birth, ID band Patient awake    Reviewed: Allergy & Precautions, NPO status , Patient's Chart, lab work & pertinent test results  Airway Mallampati: II       Dental  (+) Teeth Intact   Pulmonary asthma , former smoker,     + decreased breath sounds      Cardiovascular hypertension, Pt. on medications  Rhythm:Regular Rate:Normal     Neuro/Psych Anxiety    GI/Hepatic negative GI ROS, Neg liver ROS,   Endo/Other  Morbid obesity  Renal/GU negative Renal ROS  negative genitourinary   Musculoskeletal negative musculoskeletal ROS (+)   Abdominal (+) + obese,   Peds negative pediatric ROS (+)  Hematology   Anesthesia Other Findings   Reproductive/Obstetrics                             Anesthesia Physical Anesthesia Plan  ASA: II  Anesthesia Plan: General   Post-op Pain Management:    Induction: Intravenous  PONV Risk Score and Plan: 0  Airway Management Planned: Natural Airway and Nasal Cannula  Additional Equipment:   Intra-op Plan:   Post-operative Plan:   Informed Consent: I have reviewed the patients History and Physical, chart, labs and discussed the procedure including the risks, benefits and alternatives for the proposed anesthesia with the patient or authorized representative who has indicated his/her understanding and acceptance.     Plan Discussed with: Surgeon  Anesthesia Plan Comments:         Anesthesia Quick Evaluation

## 2016-10-09 NOTE — Anesthesia Postprocedure Evaluation (Signed)
Anesthesia Post Note  Patient: Barbara Pennington  Procedure(s) Performed: COLONOSCOPY WITH PROPOFOL (N/A )  Patient location during evaluation: PACU Anesthesia Type: General Level of consciousness: awake Pain management: pain level controlled Vital Signs Assessment: post-procedure vital signs reviewed and stable Respiratory status: spontaneous breathing Cardiovascular status: stable Anesthetic complications: no     Last Vitals:  Vitals:   10/09/16 0849 10/09/16 0859  BP: 122/82 129/83  Pulse: (!) 58 (!) 57  Resp: 10 16  Temp:    SpO2: 99% 99%    Last Pain:  Vitals:   10/09/16 0829  TempSrc: Tympanic  PainSc:                  VAN STAVEREN,Anah Billard

## 2016-10-09 NOTE — Transfer of Care (Signed)
Immediate Anesthesia Transfer of Care Note  Patient: Jourdan L Clausing  Procedure(s) Performed: COLONOSCOPY WITH PROPOFOL (N/A )  Patient Location: PACU  Anesthesia Type:General  Level of Consciousness: awake and alert   Airway & Oxygen Therapy: Patient Spontanous Breathing and Patient connected to nasal cannula oxygen  Post-op Assessment: Report given to RN and Post -op Vital signs reviewed and stable  Post vital signs: Reviewed  Last Vitals:  Vitals:   10/09/16 0713  BP: 128/88  Pulse: 67  Resp: 18  Temp: 37 C  SpO2: 96%    Last Pain:  Vitals:   10/09/16 0713  TempSrc: Tympanic  PainSc: 0-No pain         Complications: No apparent anesthesia complications

## 2016-10-09 NOTE — H&P (Signed)
Jonathon Bellows MD [address], [place], [place] [address] Phone: [phone] Fax : [phone]  Primary Care Physician:  Patient, No Pcp Per Primary Gastroenterologist:  Dr. Jonathon Bellows   Pre-Procedure History & Physical: HPI:  Barbara Pennington is a 36 y.o. female is here for an colonoscopy.   Past Medical History:  Diagnosis Date  . Anxiety   . Arthritis 2006   rhuematoid - mild - no current issues  . Asthma 2003  . Complication of anesthesia    ran fever after c-section - had infection.  "Usually" has low temp after surgery.  . Hypertension 2013  . Lump or mass in breast 2013   RIGHT BREAST  . Motion sickness    repeated amusement park rides  . Scoliosis    no current issues  . Shortness of breath dyspnea    secondary to weight   . Ulcer 2001    Past Surgical History:  Procedure Laterality Date  . ABDOMINAL HYSTERECTOMY  2008  . BREAST BIOPSY Left 2013   CORE - FIBROADENOMA VS PHYLLODES  . CESAREAN SECTION  2007  . GUM SURGERY  2004  . PLANTAR FASCIA RELEASE Left 08/19/2014   Procedure: ENDOSCOPIC PLANTAR FASCIOTOMY RELEASE L WITH TOPAZ;  Surgeon: Albertine Patricia, DPM;  Location: [place];  Service: Podiatry;  Laterality: Left;  LMA WITH POPLITEAL BLOCK  . TONSILLECTOMY  1984    Prior to Admission medications   Medication Sig Start Date End Date Taking? Authorizing Provider  BUPROPION HBR ER PO Take by mouth.   Yes [provider]  escitalopram (LEXAPRO) 10 MG tablet Take by mouth.   Yes [provider]  acetaminophen (TYLENOL) 325 MG tablet Take by mouth.    [provider]  albuterol (PROVENTIL HFA;VENTOLIN HFA) 108 (90 Base) MCG/ACT inhaler Inhale 2 puffs into the lungs every 6 (six) hours as needed for wheezing or shortness of breath. 05/16/15   Johnn Hai, PA-C  meloxicam (MOBIC) 15 MG tablet Take 1 tablet (15 mg total) by mouth daily. Patient not taking: Reported on 10/03/2016 02/06/16   Cuthriell, Charline Bills,  PA-C  ondansetron (ZOFRAN ODT) 4 MG disintegrating tablet Take 1 tablet (4 mg total) by mouth every 8 (eight) hours as needed for nausea or vomiting. Patient not taking: Reported on 10/03/2016 02/11/16   Paulette Blanch, MD  sertraline (ZOLOFT) 100 MG tablet Take 100 mg by mouth daily. AM    [provider]    Allergies as of 10/03/2016 - Review Complete 10/03/2016  Allergen Reaction Noted  . Latex Rash 08/13/2014    Family History  Problem Relation Age of Onset  . Testicular cancer Brother   . Colon cancer Paternal Grandfather     Social History   Social History  . Marital status: Divorced    Spouse name: N/A  . Number of children: N/A  . Years of education: N/A   Occupational History  . Not on file.   Social History Main Topics  . Smoking status: Former Smoker    Packs/day: 0.25    Years: 10.00    Quit date: 07/05/2014  . Smokeless tobacco: Never Used  . Alcohol use Yes     Comment: rare  . Drug use: No  . Sexual activity: Not on file   Other Topics Concern  . Not on file   Social History Narrative  . No narrative on file    Review of Systems: See HPI, otherwise negative ROS  Physical  Exam: BP 128/88   Pulse 67   Temp 98.6 F (37 C) (Tympanic)   Resp 18   Ht 5' 4.4" (1.636 m)   Wt 281 lb (127.5 kg)   SpO2 96%   BMI 47.64 kg/m  General:   Alert,  pleasant and cooperative in NAD Head:  Normocephalic and atraumatic. Neck:  Supple; no masses or thyromegaly. Lungs:  Clear throughout to auscultation.    Heart:  Regular rate and rhythm. Abdomen:  Soft, nontender and nondistended. Normal bowel sounds, without guarding, and without rebound.   Neurologic:  Alert and  oriented x4;  grossly normal neurologically.  Impression/Plan: Barbara Pennington is here for an colonoscopy to be performed for rectal bleeding   Risks, benefits, limitations, and alternatives regarding  colonoscopy have been reviewed with the patient.  Questions have been answered.  All  parties agreeable.   Jonathon Bellows, MD  10/09/2016, 7:57 AM

## 2016-10-10 ENCOUNTER — Encounter: Payer: Self-pay | Admitting: Gastroenterology

## 2016-10-10 LAB — SURGICAL PATHOLOGY

## 2016-10-11 ENCOUNTER — Telehealth: Payer: Self-pay | Admitting: Gastroenterology

## 2016-10-11 NOTE — Telephone Encounter (Signed)
Patient calling for pathology results. Also, she thinks she remembers Dr. Vicente Males saying she might have sleep apnea after her procedure???

## 2016-10-12 ENCOUNTER — Other Ambulatory Visit: Payer: Self-pay

## 2016-10-12 ENCOUNTER — Telehealth: Payer: Self-pay

## 2016-10-12 DIAGNOSIS — K649 Unspecified hemorrhoids: Secondary | ICD-10-CM

## 2016-10-12 NOTE — Telephone Encounter (Signed)
Patient has been scheduled for hemorrhoid clinic 1:30 pm. Questionnaire scanned to chart.   Thanks  [employer]

## 2016-10-14 ENCOUNTER — Encounter: Payer: Self-pay | Admitting: Gastroenterology

## 2016-10-16 ENCOUNTER — Telehealth: Payer: Self-pay | Admitting: Gastroenterology

## 2016-10-16 NOTE — Telephone Encounter (Signed)
Patient Barbara Pennington wanting her pathology results.

## 2016-10-16 NOTE — Telephone Encounter (Signed)
Advised patient of pathology per request.   Advised letter with information will be mailed to pt.

## 2016-10-24 ENCOUNTER — Telehealth: Payer: Self-pay | Admitting: Gastroenterology

## 2016-10-24 NOTE — Telephone Encounter (Signed)
Left a voice message for patient to call and reschedule part 2 of the hemorrhoid clinic. Nov 26 is not a HC day

## 2016-11-19 ENCOUNTER — Ambulatory Visit (INDEPENDENT_AMBULATORY_CARE_PROVIDER_SITE_OTHER): Payer: Self-pay | Admitting: Gastroenterology

## 2016-11-19 ENCOUNTER — Ambulatory Visit: Payer: Self-pay | Admitting: Gastroenterology

## 2016-11-19 ENCOUNTER — Encounter (INDEPENDENT_AMBULATORY_CARE_PROVIDER_SITE_OTHER): Payer: Self-pay

## 2016-11-19 ENCOUNTER — Encounter: Payer: Self-pay | Admitting: Gastroenterology

## 2016-11-19 ENCOUNTER — Telehealth: Payer: Self-pay

## 2016-11-19 VITALS — BP 143/94 | HR 80 | Temp 99.0°F | Ht 64.0 in | Wt 287.8 lb

## 2016-11-19 DIAGNOSIS — K644 Residual hemorrhoidal skin tags: Secondary | ICD-10-CM

## 2016-11-19 DIAGNOSIS — K648 Other hemorrhoids: Secondary | ICD-10-CM

## 2016-11-19 DIAGNOSIS — K602 Anal fissure, unspecified: Secondary | ICD-10-CM

## 2016-11-19 DIAGNOSIS — K5900 Constipation, unspecified: Secondary | ICD-10-CM

## 2016-11-19 NOTE — Telephone Encounter (Signed)
0.125 % nitroglycerin called to [employer]. Pt notified.  thx Sharyn Lull

## 2016-11-19 NOTE — Progress Notes (Signed)
Rubber band ligation for symptomatic hemorrhoids was performed.  Please refer to the scanned visit note under media.  Cephas Darby, MD [address]  [place]  [place], [place] [address]  Main: [phone]  Fax: [phone] Pager: [phone]

## 2016-12-03 ENCOUNTER — Ambulatory Visit: Payer: Self-pay | Admitting: Gastroenterology

## 2016-12-08 ENCOUNTER — Emergency Department: Payer: Self-pay

## 2016-12-08 ENCOUNTER — Emergency Department
Admission: EM | Admit: 2016-12-08 | Discharge: 2016-12-08 | Disposition: A | Discharge status: Home / Self Care | Payer: Self-pay | Attending: Emergency Medicine | Admitting: Emergency Medicine

## 2016-12-08 ENCOUNTER — Other Ambulatory Visit: Payer: Self-pay

## 2016-12-08 ENCOUNTER — Encounter: Payer: Self-pay | Admitting: Emergency Medicine

## 2016-12-08 DIAGNOSIS — N61 Mastitis without abscess: Secondary | ICD-10-CM | POA: Insufficient documentation

## 2016-12-08 DIAGNOSIS — N63 Unspecified lump in unspecified breast: Secondary | ICD-10-CM

## 2016-12-08 DIAGNOSIS — I10 Essential (primary) hypertension: Secondary | ICD-10-CM | POA: Insufficient documentation

## 2016-12-08 DIAGNOSIS — J45909 Unspecified asthma, uncomplicated: Secondary | ICD-10-CM | POA: Insufficient documentation

## 2016-12-08 DIAGNOSIS — Z87891 Personal history of nicotine dependence: Secondary | ICD-10-CM | POA: Insufficient documentation

## 2016-12-08 DIAGNOSIS — N644 Mastodynia: Secondary | ICD-10-CM

## 2016-12-08 DIAGNOSIS — R52 Pain, unspecified: Secondary | ICD-10-CM

## 2016-12-08 DIAGNOSIS — Z79899 Other long term (current) drug therapy: Secondary | ICD-10-CM | POA: Insufficient documentation

## 2016-12-08 LAB — CBC WITH DIFFERENTIAL/PLATELET
Basophils Absolute: 0 10*3/uL (ref 0–0.1)
Basophils Relative: 1 %
EOS ABS: 0.3 10*3/uL (ref 0–0.7)
EOS PCT: 5 %
HCT: 41.2 % (ref 35.0–47.0)
Hemoglobin: 13.9 g/dL (ref 12.0–16.0)
LYMPHS ABS: 2.2 10*3/uL (ref 1.0–3.6)
Lymphocytes Relative: 36 %
MCH: 30.2 pg (ref 26.0–34.0)
MCHC: 33.8 g/dL (ref 32.0–36.0)
MCV: 89.2 fL (ref 80.0–100.0)
MONO ABS: 0.5 10*3/uL (ref 0.2–0.9)
Monocytes Relative: 8 %
Neutro Abs: 3 10*3/uL (ref 1.4–6.5)
Neutrophils Relative %: 50 %
PLATELETS: 201 10*3/uL (ref 150–440)
RBC: 4.61 MIL/uL (ref 3.80–5.20)
RDW: 13.1 % (ref 11.5–14.5)
WBC: 6 10*3/uL (ref 3.6–11.0)

## 2016-12-08 LAB — COMPREHENSIVE METABOLIC PANEL
ALBUMIN: 4 g/dL (ref 3.5–5.0)
ALT: 26 U/L (ref 14–54)
AST: 18 U/L (ref 15–41)
Alkaline Phosphatase: 48 U/L (ref 38–126)
Anion gap: 9 (ref 5–15)
BILIRUBIN TOTAL: 0.6 mg/dL (ref 0.3–1.2)
BUN: 9 mg/dL (ref 6–20)
CHLORIDE: 102 mmol/L (ref 101–111)
CO2: 26 mmol/L (ref 22–32)
CREATININE: 1.09 mg/dL — AB (ref 0.44–1.00)
Calcium: 9 mg/dL (ref 8.9–10.3)
GFR calc Af Amer: >60 mL/min (ref >60)
GLUCOSE: 107 mg/dL — AB (ref 65–99)
Potassium: 3.5 mmol/L (ref 3.5–5.1)
Sodium: 137 mmol/L (ref 135–145)
TOTAL PROTEIN: 7.2 g/dL (ref 6.5–8.1)

## 2016-12-08 MED ORDER — DICLOXACILLIN SODIUM 500 MG PO CAPS: 500.0000 mg | ORAL_CAPSULE | Freq: Four times a day (QID) | ORAL | 0 refills | Status: DC

## 2016-12-08 MED ORDER — KETOROLAC TROMETHAMINE 30 MG/ML IJ SOLN
INTRAMUSCULAR | Status: AC
  Filled 2016-12-08: qty 1

## 2016-12-08 MED ORDER — KETOROLAC TROMETHAMINE 60 MG/2ML IM SOLN
15.0000 mg | Freq: Once | INTRAMUSCULAR | Status: AC
  Administered 2016-12-08: 15 mg via INTRAMUSCULAR

## 2016-12-08 MED ORDER — OXYCODONE HCL 5 MG PO TABS: 5.0000 mg | ORAL_TABLET | Freq: Four times a day (QID) | ORAL | 0 refills | Status: DC | PRN

## 2016-12-08 NOTE — ED Notes (Signed)
Patient r/f [name]

## 2016-12-08 NOTE — ED Notes (Signed)
ED Provider at bedside. 

## 2016-12-08 NOTE — ED Notes (Signed)
Patient states she has had left breast pain, swelling, and tenderness since Wednesday.  Pt denies any fevers.

## 2016-12-08 NOTE — ED Provider Notes (Signed)
[facility] Emergency Department Provider Note  ____________________________________________  Time seen: Approximately 5:31 PM  I have reviewed the triage vital signs and the nursing notes.   HISTORY  Chief Complaint breast inflammation/redness    HPI Barbara Pennington is a 36 y.o. female who complains of gradual onset of left breast pain and swelling over the past 3 or 4 days. No recent trauma. Not lactating, last period was in 2008 before she had a hysterectomy. Has subjective fevers and chills. No drainage from the nipple. No history of breast cancer. Does have a history of a nodule in the left breast close to the axilla that was worked up with biopsy and mammography in the past, turned out to be negative. Denies any radiating pain. Worse with pressure on the area, no alleviating factors, moderate intensity.     Past Medical History:  Diagnosis Date  . Anxiety   . Arthritis 2006   rhuematoid - mild - no current issues  . Asthma 2003  . Complication of anesthesia    ran fever after c-section - had infection.  "Usually" has low temp after surgery.  . Hypertension 2013  . Lump or mass in breast 2013   RIGHT BREAST  . Motion sickness    repeated amusement park rides  . Scoliosis    no current issues  . Shortness of breath dyspnea    secondary to weight   . Ulcer 2001     Patient Active Problem List   Diagnosis Date Noted  . Hypertension      Past Surgical History:  Procedure Laterality Date  . ABDOMINAL HYSTERECTOMY  2008  . BREAST BIOPSY Left 2013   CORE - FIBROADENOMA VS PHYLLODES  . CESAREAN SECTION  2007  . COLONOSCOPY WITH PROPOFOL N/A 10/09/2016   Procedure: COLONOSCOPY WITH PROPOFOL;  Surgeon: Jonathon Bellows, MD;  Location[facility] ENDOSCOPY;  Service: Gastroenterology;  Laterality: N/A;  . GUM SURGERY  2004  . PLANTAR FASCIA RELEASE Left 08/19/2014   Procedure: ENDOSCOPIC PLANTAR FASCIOTOMY RELEASE L WITH TOPAZ;  Surgeon: Albertine Patricia,  DPM;  Location: [place];  Service: Podiatry;  Laterality: Left;  LMA WITH POPLITEAL BLOCK  . TONSILLECTOMY  1984     Prior to Admission medications   Medication Sig Start Date End Date Taking? Authorizing Provider  acetaminophen (TYLENOL) 325 MG tablet Take by mouth.    [provider]  albuterol (PROVENTIL HFA;VENTOLIN HFA) 108 (90 Base) MCG/ACT inhaler Inhale 2 puffs into the lungs every 6 (six) hours as needed for wheezing or shortness of breath. 05/16/15   Johnn Hai, PA-C  BUPROPION HBR ER PO Take by mouth.    [provider]  dicloxacillin (DYNAPEN) 500 MG capsule Take 1 capsule (500 mg total) by mouth 4 (four) times daily. 12/08/16   Carrie Mew, MD  escitalopram (LEXAPRO) 10 MG tablet Take by mouth.    [provider]  meloxicam (MOBIC) 15 MG tablet Take 1 tablet (15 mg total) by mouth daily. 02/06/16   Cuthriell, Charline Bills, PA-C  ondansetron (ZOFRAN ODT) 4 MG disintegrating tablet Take 1 tablet (4 mg total) by mouth every 8 (eight) hours as needed for nausea or vomiting. Patient not taking: Reported on 11/19/2016 02/11/16   Paulette Blanch, MD  oxyCODONE (ROXICODONE) 5 MG immediate release tablet Take 1 tablet (5 mg total) by mouth every 6 (six) hours as needed for breakthrough pain. 12/08/16   Carrie Mew, MD  sertraline (ZOLOFT) 100 MG tablet Take  100 mg by mouth daily. AM    [provider]     Allergies Latex   Family History  Problem Relation Age of Onset  . Testicular cancer Brother   . Colon cancer Paternal Grandfather     Social History Social History   Tobacco Use  . Smoking status: Former Smoker    Packs/day: 0.25    Years: 10.00    Pack years: 2.50    Last attempt to quit: 07/05/2014    Years since quitting: 2.4  . Smokeless tobacco: Never Used  Substance Use Topics  . Alcohol use: Yes    Comment: rare  . Drug use: No    Review of Systems  Constitutional:  Positive subjective fevers and  chills  ENT:   No sore throat. No rhinorrhea. Cardiovascular:   No chest pain or syncope. Respiratory:   No dyspnea or cough. Gastrointestinal:   Negative for abdominal pain, vomiting and diarrhea.  Musculoskeletal:  Left breast pain as above All other systems reviewed and are negative except as documented above in ROS and HPI.  ____________________________________________   PHYSICAL EXAM:  VITAL SIGNS: ED Triage Vitals  Enc Vitals Group     BP 12/08/16 1405 138/86     Pulse Rate 12/08/16 1405 82     Resp 12/08/16 1405 18     Temp 12/08/16 1405 99.5 F (37.5 C)     Temp Source 12/08/16 1405 Oral     SpO2 12/08/16 1405 96 %     Weight 12/08/16 1405 285 lb (129.3 kg)     Height 12/08/16 1405 5\' 4"  (1.626 m)     Head Circumference --      Peak Flow --      Pain Score 12/08/16 1404 5     Pain Loc --      Pain Edu? --      Excl. in [place]? --     Vital signs reviewed, nursing assessments reviewed. Examined with nurse Shanon Brow at bedside  Constitutional:   Alert and oriented. Well appearing and in no distress. ENT   Head:   Normocephalic and atraumatic. Cardiovascular:   RRR. Marland Kitchen  Respiratory:   Normal respiratory effort without tachypnea/retractions.   Musculoskeletal:   Normal range of motion in all extremities. Left breast with erythema diffusely in the center of the breast, around the areola. No discharge from nipple or nipple retraction. There is a palpable tender mass below the skin surface, behind the nipple.   Neurologic:   Normal speech and language.  Motor grossly intact. No gross focal neurologic deficits are appreciated.  Skin:    Skin is warm, dry with inflammatory breast changes as above, otherwise unremarkable ____________________________________________    LABS (pertinent positives/negatives) (all labs ordered are listed, but only abnormal results are displayed) Labs Reviewed  COMPREHENSIVE METABOLIC PANEL - Abnormal; Notable for the following components:       Result Value   Glucose, Bld 107 (*)    Creatinine, Ser 1.09 (*)    All other components within normal limits  CBC WITH DIFFERENTIAL/PLATELET   ____________________________________________   EKG    ____________________________________________    RADIOLOGY  US Breast Ltd Uni Left Inc Axilla  Result Date: 12/08/2016 CLINICAL DATA:  36 year old female with acute breast pain and swelling for 4 days. EXAM: ULTRASOUND OF THE LEFT BREAST COMPARISON:  Previous exam(s). FINDINGS: Targeted ultrasound is performed, showing a 1.4 cm ill-defined hypoechoic area without internal vascular flow directly behind left nipple and may suspicious  for developing abscess. No other significant abnormalities are noted. IMPRESSION: 1.4 cm ill-defined hypoechoic area directly behind the left nipple suspicious for developing abscess. Recommend left ultrasound follow-up in 7-10 days with bilateral diagnostic mammograms to exclude neoplasm. RECOMMENDATION: Bilateral diagnostic mammograms and left breast ultrasound in 7-10 days after appropriate antibiotic therapy or drainage. Electronically Signed   By: Margarette Canada M.D.   On: 12/08/2016 16:45    ____________________________________________   PROCEDURES Procedures  ____________________________________________   DIFFERENTIAL DIAGNOSIS  Mastitis versus breast abscess.  CLINICAL IMPRESSION / ASSESSMENT AND PLAN / ED COURSE  Pertinent labs & imaging results that were available during my care of the patient were reviewed by me and considered in my medical decision making (see chart for details).   Patient well-appearing no acute distress, nontoxic and not septic. Presents with mastitis, concern for a breast abscess or an ultrasound was performed. This to show a 14 mm fluid collection, consistent with early abscess. Discussed briefly with general surgery who advises that an abscess the small, less than 2-3 cm, is highly likely to respond to oral therapy alone.  Start the patient on dicloxacillin, follow-up with primary care. I did advise her that due to the lack of any other inciting cause of her breast infection, she should be on guard for the possibility that this was caused by inflammatory breast cancer. Emphasized the importance of following up with primary care for repeat evaluation after completing the course of antibiotics as she may need referral to the breast care center for further workup.  No evidence of bacteremia or sepsis. No evidence of ACS PE dissection pericarditis pneumonia or pneumothorax. Patient is comfortable with this plan. Provide a limited prescription of oxycodone as needed for moderate to severe pain. Otherwise recommend that she focus on pain relief with Tylenol and ibuprofen.      ____________________________________________   FINAL CLINICAL IMPRESSION(S) / ED DIAGNOSES    Final diagnoses:  Breast swelling  Acute breast pain  Mastitis, left, acute      This SmartLink is deprecated. Use AVSMEDLIST instead to display the medication list for a patient.   Portions of this note were generated with dragon dictation software. Dictation errors may occur despite best attempts at proofreading.    Carrie Mew, MD 12/08/16 [phone]

## 2016-12-08 NOTE — ED Triage Notes (Addendum)
Pt c/o redness, swelling, and pain to left breast since Wednesday.  Left breast is red and warm to touch. Redness has been increasing. Pt c/o temps in 99 range with chills. Appears similar to cellulitis.

## 2016-12-09 NOTE — ED Provider Notes (Signed)
I was called by pharmacy that the patient cannot afford her prescription for dicloxacillin.  Switched to keflex 500mg  PO Q6H x 10 days.   Darel Hong, MD 12/09/16 1419

## 2016-12-10 ENCOUNTER — Telehealth: Payer: Self-pay | Admitting: Gastroenterology

## 2016-12-10 ENCOUNTER — Ambulatory Visit: Payer: Self-pay | Admitting: Gastroenterology

## 2016-12-10 NOTE — Telephone Encounter (Signed)
Patient stated she isn't having any trouble and doesn't want to continue the hemorrhoid bandings.

## 2016-12-19 ENCOUNTER — Encounter: Payer: Self-pay | Admitting: *Deleted

## 2016-12-19 ENCOUNTER — Encounter (INDEPENDENT_AMBULATORY_CARE_PROVIDER_SITE_OTHER): Payer: Self-pay

## 2016-12-19 ENCOUNTER — Ambulatory Visit
Admission: RE | Admit: 2016-12-19 | Discharge: 2016-12-19 | Disposition: A | Discharge status: Home / Self Care | Payer: Self-pay | Source: Ambulatory Visit | Attending: Oncology | Admitting: Oncology

## 2016-12-19 ENCOUNTER — Telehealth: Payer: Self-pay | Admitting: *Deleted

## 2016-12-19 ENCOUNTER — Ambulatory Visit: Payer: Self-pay | Attending: Oncology | Admitting: *Deleted

## 2016-12-19 ENCOUNTER — Telehealth: Payer: Self-pay | Admitting: Gastroenterology

## 2016-12-19 VITALS — BP 127/78 | HR 73 | Temp 98.8°F | Resp 18 | Ht 66.0 in | Wt 288.0 lb

## 2016-12-19 DIAGNOSIS — N63 Unspecified lump in unspecified breast: Secondary | ICD-10-CM

## 2016-12-19 NOTE — Telephone Encounter (Signed)
Patient called and stated she had her mammogram and ultrasound this afternoon, and the radiologist has recommended follow up mammo in 6 weeks.  We discussed the surgical consult since the erythema was not completely resolved.  I have scheduled her to see Dr. Bary Castilla tomorrow at 2:00.  She is to take a photo ID and all her meds and arrive 30 minutes early.

## 2016-12-19 NOTE — Patient Instructions (Signed)
Gave patient hand-out, Women Staying Healthy, Active and Well from [facility], with education on breast health, pap smears, heart and colon health. 

## 2016-12-19 NOTE — Progress Notes (Signed)
Subjective:     Patient ID: Barbara Pennington, female   DOB: 1980/09/12, 36 y.o.   MRN: [id number]  HPI   Review of Systems     Objective:   Physical Exam  Pulmonary/Chest: Right breast exhibits no inverted nipple, no mass, no nipple discharge, no skin change and no tenderness. Left breast exhibits mass and skin change. Left breast exhibits no inverted nipple, no nipple discharge and no tenderness. Breasts are asymmetrical.    Abdominal: There is no splenomegaly or hepatomegaly.  Genitourinary: Right adnexum displays no mass, no tenderness and no fullness. Left adnexum displays no mass, no tenderness and no fullness. No erythema or tenderness in the vagina. No foreign body in the vagina. No signs of injury around the vagina. No vaginal discharge found.  Skin: Rash noted.          Assessment:     36 year old White female presents as a self referral to [facility] after being seen in the ED on 12/08/16 for left breast pain and redness.  She had an ultrasound at that time which revealed an approximate 1.4 cm retroareolar nodule thought to be the beginning of an abscess.  She was started on a 10 day course of antibiotics, and it was recommended by radiology that she return in 10 days for another ultrasound and bilateral diagnostic mammogram.  On clinical breast exam I can visualize faint redness at the upper left breast.  Patient states the redness is better since being on the antibiotics, but states the area is still tender.  I can palpate an approximate 1 cm nodule at 3:00 left breast about 3 cm from the nipple.  Patient states this nodule was biopsied years ago and was benign.  Taught self breast awareness.  Patient is not sure if she has her cervix, but does still have her ovaries.  States she had a hysterectomy for dysfunctional uterine bleeding.  Pelvic exam performed.  There is no cervix visible which is consistent with a hysterectomy.  The patient has a lesion like rash over her limbs and trunk.   States it has been there about a year.  States it occasionally itches.  She thought maybe she was allergic to something, but does not know what.  She has not seen a dermatologist. Patient has been screened for eligibility.  She does not have any insurance, Medicare or Medicaid.  She also meets financial eligibility.  Hand-out given on the Affordable Care Act.    Plan:     Bilateral diagnostic mammogram and ultrasound ordered.  Reviewed with patient the possibility of needing additional antibiotics since the area of redness has not cleared after 10 days of antibiotics.  Explained I may refer her on to see a surgeon for further evaluation, possible extended antibiotic therapy and possible biopsy.  She is agreeable to the plan.  Will follow up per [facility] protocol.

## 2016-12-20 ENCOUNTER — Encounter: Payer: Self-pay | Admitting: General Surgery

## 2016-12-20 ENCOUNTER — Ambulatory Visit (INDEPENDENT_AMBULATORY_CARE_PROVIDER_SITE_OTHER): Payer: Self-pay | Admitting: General Surgery

## 2016-12-20 VITALS — BP 132/74 | HR 82 | Temp 97.6°F | Resp 14 | Ht 64.0 in | Wt 290.0 lb

## 2016-12-20 DIAGNOSIS — D242 Benign neoplasm of left breast: Secondary | ICD-10-CM

## 2016-12-20 DIAGNOSIS — N61 Mastitis without abscess: Secondary | ICD-10-CM

## 2016-12-20 NOTE — Progress Notes (Signed)
Patient ID: LORELEE Pennington, female   DOB: 01-Oct-1980, 36 y.o.   MRN: [id number]  Chief Complaint  Patient presents with  . Other    HPI Barbara Pennington is a 36 y.o. female who presents for a breast evaluation. The most recent mammogram was done on 12/20/2016 . Patient had a left breast biopsy in 2013. She was seen in the ER on 12/08/2016,due left breast redness and painful. Was taking Keflex.  Patient does perform regular self breast checks and gets regular mammograms done.    The patient denies any nipple-oral contact in the month prior to her present episode of inflammation.  HPI  Past Medical History:  Diagnosis Date  . Anxiety   . Arthritis 2006   rhuematoid - mild - no current issues  . Asthma 2003  . Complication of anesthesia    ran fever after c-section - had infection.  "Usually" has low temp after surgery.  . Hypertension 2013  . Lump or mass in breast 2013   RIGHT BREAST  . Motion sickness    repeated amusement park rides  . Scoliosis    no current issues  . Shortness of breath dyspnea    secondary to weight   . Ulcer 2001    Past Surgical History:  Procedure Laterality Date  . ABDOMINAL HYSTERECTOMY  2008  . BREAST BIOPSY Left 2013   CORE - FIBROADENOMA VS PHYLLODES  . CESAREAN SECTION  2007  . COLONOSCOPY WITH PROPOFOL N/A 10/09/2016   Procedure: COLONOSCOPY WITH PROPOFOL;  Surgeon: Jonathon Bellows, MD;  Location: [facility] ENDOSCOPY;  Service: Gastroenterology;  Laterality: N/A;  . GUM SURGERY  2004  . PLANTAR FASCIA RELEASE Left 08/19/2014   Procedure: ENDOSCOPIC PLANTAR FASCIOTOMY RELEASE L WITH TOPAZ;  Surgeon: Albertine Patricia, DPM;  Location: [place];  Service: Podiatry;  Laterality: Left;  LMA WITH POPLITEAL BLOCK  . TONSILLECTOMY  1984    Family History  Problem Relation Age of Onset  . Testicular cancer Brother   . Colon cancer Paternal Grandfather   . Breast cancer Maternal Grandmother     Social History Social History   Tobacco Use  .  Smoking status: Former Smoker    Packs/day: 0.25    Years: 10.00    Pack years: 2.50    Last attempt to quit: 07/05/2014    Years since quitting: 2.4  . Smokeless tobacco: Never Used  Substance Use Topics  . Alcohol use: Yes    Comment: rare  . Drug use: No    Allergies  Allergen Reactions  . Latex Rash    Current Outpatient Medications  Medication Sig Dispense Refill  . acetaminophen (TYLENOL) 325 MG tablet Take by mouth.    Marland Kitchen albuterol (PROVENTIL HFA;VENTOLIN HFA) 108 (90 Base) MCG/ACT inhaler Inhale 2 puffs into the lungs every 6 (six) hours as needed for wheezing or shortness of breath. 1 Inhaler 2  . BUPROPION HBR ER PO Take by mouth.    . dicloxacillin (DYNAPEN) 500 MG capsule Take 1 capsule (500 mg total) by mouth 4 (four) times daily. 40 capsule 0  . escitalopram (LEXAPRO) 10 MG tablet Take by mouth.    . meloxicam (MOBIC) 15 MG tablet Take 1 tablet (15 mg total) by mouth daily. 30 tablet 0   No current facility-administered medications for this visit.     Review of Systems Review of Systems  Constitutional: Negative.   Respiratory: Negative.   Cardiovascular: Negative.     Blood pressure 132/74, pulse 82,  temperature 97.6 F (36.4 C), temperature source Oral, resp. rate 14, height 5\' 4"  (1.626 m), weight 290 lb (131.5 kg).  Physical Exam Physical Exam  Constitutional: She is oriented to person, place, and time. She appears well-developed and well-nourished.  Eyes: Conjunctivae are normal. No scleral icterus.  Neck: Neck supple.  Cardiovascular: Normal rate, regular rhythm and normal heart sounds.  Pulmonary/Chest: Effort normal and breath sounds normal. Right breast exhibits no inverted nipple, no mass, no nipple discharge, no skin change and no tenderness. Left breast exhibits mass (1 cm nodular at 2 o'clock 75mm from nipple.). Left breast exhibits no inverted nipple, no nipple discharge, no skin change and no tenderness.    Lymphadenopathy:    She has no  cervical adenopathy.    She has no axillary adenopathy.  Neurological: She is alert and oriented to person, place, and time.  Skin: Skin is warm and dry.    Data Reviewed Ultrasound of the left breast obtained to the emergency department on 12/08/2016 suggested a 1.4 cm ill-defined hypoechoic area behind the left nipple/areola suspicious for developing abscess. Follow-up recommended in 7-10 days.  Bilateral diagnostic mammograms and left breast ultrasound obtained on December 19, 2016: IMPRESSION: 1. Interval resolution of previous imaging findings in the left subareolar region. The patient reports near complete resolution of her clinical symptoms after antibiotic therapy. No further imaging follow-up required. 2. Probably benign left axillary and intramammary lymphadenopathy likely related to the patient's recent infection. Recommendation is for mammographic and sonographic follow-up in 6-8 weeks to document resolution. 3. Benign left breast mass corresponding with the provider palpated abnormality. This corresponds to previously biopsied mass and is stable dating back to 2013. 4. No mammographic evidence of malignancy on the right.  Assessment    Recent episode of mastitis without clear clinical source. Marked improvement on a course of oral antibiotic therapy.  Stable left upper outer quadrant breast mass. (Unchanged since 2013).    Plan    I don't see any benefit from a repeat mammogram in 6-8 weeks as recommended by the radiologist.  With resolution of the retro areolar changes with antibiotic therapy the findings of the prominent axillary lymph node are unlikely to represent any significant process outside of that related to inflammatory changes.    Patient to return in six week for left breast . The patient is aware to call back for any questions or concerns.  The patient was encouraged to call promptly should she have any recurrent symptoms regarding inflammation or  erythema in the breast  HPI, Physical Exam, Assessment and Plan have been scribed under the direction and in the presence of Hervey Ard, MD.  Gaspar Cola, CMA  I have completed the exam and reviewed the above documentation for accuracy and completeness.  I agree with the above.  Haematologist has been used and any errors in dictation or transcription are unintentional.  Hervey Ard, M.D., F.A.C.S.  Robert Bellow 12/20/2016, 8:32 PM

## 2016-12-20 NOTE — Patient Instructions (Signed)
  Patient to return in six week for left breast . The patient is aware to call back for any questions or concerns.

## 2016-12-27 NOTE — Telephone Encounter (Signed)
ERROR

## 2017-01-11 DIAGNOSIS — Z87891 Personal history of nicotine dependence: Secondary | ICD-10-CM | POA: Insufficient documentation

## 2017-01-11 DIAGNOSIS — Z91040 Latex allergy status: Secondary | ICD-10-CM | POA: Insufficient documentation

## 2017-01-11 DIAGNOSIS — N10 Acute tubulo-interstitial nephritis: Secondary | ICD-10-CM | POA: Insufficient documentation

## 2017-01-11 DIAGNOSIS — J45909 Unspecified asthma, uncomplicated: Secondary | ICD-10-CM | POA: Insufficient documentation

## 2017-01-11 DIAGNOSIS — I10 Essential (primary) hypertension: Secondary | ICD-10-CM | POA: Insufficient documentation

## 2017-01-11 DIAGNOSIS — R300 Dysuria: Secondary | ICD-10-CM | POA: Insufficient documentation

## 2017-01-11 DIAGNOSIS — Z79899 Other long term (current) drug therapy: Secondary | ICD-10-CM | POA: Insufficient documentation

## 2017-01-11 LAB — URINALYSIS, COMPLETE (UACMP) WITH MICROSCOPIC
BILIRUBIN URINE: NEGATIVE
Glucose, UA: NEGATIVE mg/dL
KETONES UR: NEGATIVE mg/dL
Nitrite: NEGATIVE
Protein, ur: NEGATIVE mg/dL
SPECIFIC GRAVITY, URINE: 1.012 (ref 1.005–1.030)
pH: 5 (ref 5.0–8.0)

## 2017-01-11 LAB — CBC
HCT: 39.7 % (ref 35.0–47.0)
HEMOGLOBIN: 13.2 g/dL (ref 12.0–16.0)
MCH: 30.3 pg (ref 26.0–34.0)
MCHC: 33.3 g/dL (ref 32.0–36.0)
MCV: 91.2 fL (ref 80.0–100.0)
PLATELETS: 215 10*3/uL (ref 150–440)
RBC: 4.35 MIL/uL (ref 3.80–5.20)
RDW: 13.6 % (ref 11.5–14.5)
WBC: 7.4 10*3/uL (ref 3.6–11.0)

## 2017-01-11 LAB — BASIC METABOLIC PANEL
ANION GAP: 9 (ref 5–15)
BUN: 7 mg/dL (ref 6–20)
CO2: 24 mmol/L (ref 22–32)
CREATININE: 1.16 mg/dL — AB (ref 0.44–1.00)
Calcium: 8.6 mg/dL — ABNORMAL LOW (ref 8.9–10.3)
Chloride: 107 mmol/L (ref 101–111)
GFR calc non Af Amer: 59 mL/min — ABNORMAL LOW (ref >60)
Glucose, Bld: 127 mg/dL — ABNORMAL HIGH (ref 65–99)
Potassium: 3.6 mmol/L (ref 3.5–5.1)
Sodium: 140 mmol/L (ref 135–145)

## 2017-01-11 NOTE — ED Triage Notes (Signed)
Patient with complaint of right flank pain that started Wednesday that has become worse. Patient states that she started taking azo but it has still become worse.

## 2017-01-12 ENCOUNTER — Emergency Department
Admission: EM | Admit: 2017-01-12 | Discharge: 2017-01-12 | Disposition: A | Discharge status: Home / Self Care | Payer: Self-pay | Attending: Emergency Medicine | Admitting: Emergency Medicine

## 2017-01-12 DIAGNOSIS — N12 Tubulo-interstitial nephritis, not specified as acute or chronic: Secondary | ICD-10-CM

## 2017-01-12 MED ORDER — SODIUM CHLORIDE 0.9 % IV BOLUS (SEPSIS)
1000.0000 mL | Freq: Once | INTRAVENOUS | Status: AC
  Administered 2017-01-12: 1000 mL via INTRAVENOUS

## 2017-01-12 MED ORDER — ONDANSETRON HCL 4 MG/2ML IJ SOLN
4.0000 mg | Freq: Once | INTRAMUSCULAR | Status: AC
  Administered 2017-01-12: 4 mg via INTRAVENOUS
  Filled 2017-01-12: qty 2

## 2017-01-12 MED ORDER — KETOROLAC TROMETHAMINE 30 MG/ML IJ SOLN
15.0000 mg | Freq: Once | INTRAMUSCULAR | Status: AC
  Administered 2017-01-12: 15 mg via INTRAVENOUS
  Filled 2017-01-12: qty 1

## 2017-01-12 MED ORDER — ONDANSETRON HCL 4 MG PO TABS: 4.0000 mg | ORAL_TABLET | Freq: Every day | ORAL | 0 refills | Status: DC | PRN

## 2017-01-12 MED ORDER — CEPHALEXIN 500 MG PO CAPS: 500.0000 mg | ORAL_CAPSULE | Freq: Four times a day (QID) | ORAL | 0 refills | Status: AC

## 2017-01-12 MED ORDER — HYDROCODONE-ACETAMINOPHEN 5-325 MG PO TABS: 1.0000 | ORAL_TABLET | Freq: Four times a day (QID) | ORAL | 0 refills | Status: DC | PRN

## 2017-01-12 MED ORDER — DEXTROSE 5 % IV SOLN
2.0000 g | Freq: Once | INTRAVENOUS | Status: AC
  Administered 2017-01-12: 2 g via INTRAVENOUS
  Filled 2017-01-12: qty 2

## 2017-01-12 NOTE — ED Provider Notes (Signed)
[facility] Emergency Department Provider Note  ____________________________________________   First MD Initiated Contact with Patient 01/12/17 [phone]     (approximate)  I have reviewed the triage vital signs and the nursing notes.   HISTORY  Chief Complaint Flank Pain   HPI Barbara Pennington is a 37 y.o. female who self presents the emergency department with 4-5 days of progressive right flank pain.  It initially began with dysuria and she began taking over-the-counter Pyridium and drinking cranberry juice without improvement.  Symptoms have been insidious onset slowly progressive and are now constant and severe in her left flank.  No fevers or chills.  The pain is worse with movement somewhat improved with rest.  Past Medical History:  Diagnosis Date  . Anxiety   . Arthritis 2006   rhuematoid - mild - no current issues  . Asthma 2003  . Complication of anesthesia    ran fever after c-section - had infection.  "Usually" has low temp after surgery.  . Hypertension 2013  . Lump or mass in breast 2013   RIGHT BREAST  . Motion sickness    repeated amusement park rides  . Scoliosis    no current issues  . Shortness of breath dyspnea    secondary to weight   . Ulcer 2001    Patient Active Problem List   Diagnosis Date Noted  . 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 Mastitis, left, acute 12/20/2016  . Hypertension     Past Surgical History:  Procedure Laterality Date  . ABDOMINAL HYSTERECTOMY  2008  . BREAST BIOPSY Left 2013   CORE - FIBROADENOMA VS PHYLLODES  . CESAREAN SECTION  2007  . COLONOSCOPY WITH PROPOFOL N/A 10/09/2016   Procedure: COLONOSCOPY WITH PROPOFOL;  Surgeon: Jonathon Bellows, MD;  Location: [facility] ENDOSCOPY;  Service: Gastroenterology;  Laterality: N/A;  . GUM SURGERY  2004  . PLANTAR FASCIA RELEASE Left 08/19/2014   Procedure: ENDOSCOPIC PLANTAR FASCIOTOMY RELEASE L WITH TOPAZ;  Surgeon: Albertine Patricia, DPM;  Location: [place];  Service: Podiatry;  Laterality: Left;  LMA WITH POPLITEAL BLOCK  . TONSILLECTOMY  1984    Prior to Admission medications   Medication Sig Start Date End Date Taking? Authorizing Provider  acetaminophen (TYLENOL) 325 MG tablet Take by mouth.    [provider]  albuterol (PROVENTIL HFA;VENTOLIN HFA) 108 (90 Base) MCG/ACT inhaler Inhale 2 puffs into the lungs every 6 (six) hours as needed for wheezing or shortness of breath. 05/16/15   Johnn Hai, PA-C  BUPROPION HBR ER PO Take by mouth.    [provider]  cephALEXin (KEFLEX) 500 MG capsule Take 1 capsule (500 mg total) by mouth 4 (four) times daily for 14 days. 01/12/17 01/26/17  Darel Hong, MD  dicloxacillin (DYNAPEN) 500 MG capsule Take 1 capsule (500 mg total) by mouth 4 (four) times daily. 12/08/16   Carrie Mew, MD  escitalopram (LEXAPRO) 10 MG tablet Take by mouth.    [provider]  HYDROcodone-acetaminophen (NORCO) 5-325 MG tablet Take 1 tablet by mouth every 6 (six) hours as needed for up to 7 doses for severe pain. 01/12/17   Darel Hong, MD  meloxicam (MOBIC) 15 MG tablet Take 1 tablet (15 mg total) by mouth daily. 02/06/16   Cuthriell, Charline Bills, PA-C  ondansetron (ZOFRAN) 4 MG tablet Take 1 tablet (4 mg total) by mouth daily as needed. 01/12/17 01/12/18  Darel Hong, MD    Allergies Latex  Family History  Problem Relation Age of Onset  . Testicular cancer Brother   . Colon cancer Paternal Grandfather   . Breast cancer Maternal Grandmother     Social History Social History   Tobacco Use  . Smoking status: Former Smoker    Packs/day: 0.25    Years: 10.00    Pack years: 2.50    Last attempt to quit: 07/05/2014    Years since quitting: 2.5  . Smokeless tobacco: Never Used  Substance Use Topics  . Alcohol use: Yes    Comment: rare  . Drug use: No    Review of Systems Constitutional: No fever/chills Eyes: No visual changes. ENT: No sore  throat. Cardiovascular: Denies chest pain. Respiratory: Denies shortness of breath. Gastrointestinal: No abdominal pain.  Positive for nausea, no vomiting.  No diarrhea.  No constipation. Genitourinary: Positive for dysuria. Musculoskeletal: Positive for back pain. Skin: Negative for rash. Neurological: Negative for headaches, focal weakness or numbness.   ____________________________________________   PHYSICAL EXAM:  VITAL SIGNS: ED Triage Vitals [01/11/17 2334]  Enc Vitals Group     BP 132/61     Pulse Rate 80     Resp 20     Temp 98.9 F (37.2 C)     Temp Source Oral     SpO2 97 %     Weight 290 lb (131.5 kg)     Height 5\' 4"  (1.626 m)     Head Circumference      Peak Flow      Pain Score 8     Pain Loc      Pain Edu?      Excl. in [place]?     Constitutional: Alert and oriented x4 tearful uncomfortable appearing nontoxic no diaphoresis speaks in full clear sentences Eyes: PERRL EOMI. Head: Atraumatic. Nose: No congestion/rhinnorhea. Mouth/Throat: No trismus Neck: No stridor.   Cardiovascular: Normal rate, regular rhythm. Grossly normal heart sounds.  Good peripheral circulation. Respiratory: Normal respiratory effort.  No retractions. Lungs CTAB and moving good air Gastrointestinal: Obese abdomen soft nontender exquisite costovertebral tenderness on the right Musculoskeletal: No lower extremity edema   Neurologic:  Normal speech and language. No gross focal neurologic deficits are appreciated. Skin:  Skin is warm, dry and intact. No rash noted. Psychiatric: Mood and affect are normal. Speech and behavior are normal.    ____________________________________________   DIFFERENTIAL includes but not limited to  Pyelonephritis, nephrolithiasis, urinary tract infection, appendicitis, diverticulitis ____________________________________________   LABS (all labs ordered are listed, but only abnormal results are displayed)  Labs Reviewed  URINALYSIS, COMPLETE (UACMP)  WITH MICROSCOPIC - Abnormal; Notable for the following components:      Result Value   Color, Urine YELLOW (*)    APPearance HAZY (*)    Hgb urine dipstick SMALL (*)    Leukocytes, UA LARGE (*)    Bacteria, UA RARE (*)    Squamous Epithelial / LPF 6-30 (*)    All other components within normal limits  BASIC METABOLIC PANEL - Abnormal; Notable for the following components:   Glucose, Bld 127 (*)    Creatinine, Ser 1.16 (*)    Calcium 8.6 (*)    GFR calc non Af Amer 59 (*)    All other components within normal limits  URINE CULTURE  CBC    Lab work reviewed by me shows urinalysis consistent with infection __________________________________________  EKG   ____________________________________________  RADIOLOGY   ____________________________________________   PROCEDURES  Procedure(s) performed: no  Procedures  Critical Care performed:  no  Observation: no ____________________________________________   INITIAL IMPRESSION / ASSESSMENT AND PLAN / ED COURSE  Pertinent labs & imaging results that were available during my care of the patient were reviewed by me and considered in my medical decision making (see chart for details).  The patient is hemodynamically stable although quite uncomfortable appearing with significant right costovertebral tenderness and lower urinary symptoms which is consistent with pyelonephritis.  We will give her a first dose of ceftriaxone now as well as some IV fluids but I do anticipate outpatient management.     ----------------------------------------- 6:18 AM on 01/12/2017 -----------------------------------------  Patient's pressure came down transiently after antibiotics but is come up nicely with fluids.  Her pain is adequately controlled.  At this point she is medically stable for outpatient management with 2 weeks of cephalexin.  Strict return precautions have been given and the patient verbalizes understanding and agreement the  plan. ____________________________________________   FINAL CLINICAL IMPRESSION(S) / ED DIAGNOSES  Final diagnoses:  Pyelonephritis      NEW MEDICATIONS STARTED DURING THIS VISIT:  This SmartLink is deprecated. Use AVSMEDLIST instead to display the medication list for a patient.   Note:  This document was prepared using Dragon voice recognition software and may include unintentional dictation errors.     Darel Hong, MD 01/12/17 [phone]

## 2017-01-12 NOTE — ED Notes (Signed)
Pt reports pain to her right back over her kidney since Wednesday. Tried azo and cranberry juice with no help.

## 2017-01-12 NOTE — Discharge Instructions (Signed)
Please take all of your antibiotics as prescribed and use your pain medication as needed for severe symptoms.  Return to the emergency department for any new or worsening symptoms such as fevers, chills, worsening pain, or for any other issues whatsoever.  It was a pleasure to take care of you today, and thank you for coming to our emergency department.  If you have any questions or concerns before leaving please ask the nurse to grab me and I'm more than happy to go through your aftercare instructions again.  If you were prescribed any opioid pain medication today such as Norco, Vicodin, Percocet, morphine, hydrocodone, or oxycodone please make sure you do not drive when you are taking this medication as it can alter your ability to drive safely.  If you have any concerns once you are home that you are not improving or are in fact getting worse before you can make it to your follow-up appointment, please do not hesitate to call 911 and come back for further evaluation.  Darel Hong, MD  Results for orders placed or performed during the hospital encounter of 01/12/17  Urinalysis, Complete w Microscopic  Result Value Ref Range   Color, Urine YELLOW (A) YELLOW   APPearance HAZY (A) CLEAR   Specific Gravity, Urine 1.012 1.005 - 1.030   pH 5.0 5.0 - 8.0   Glucose, UA NEGATIVE NEGATIVE mg/dL   Hgb urine dipstick SMALL (A) NEGATIVE   Bilirubin Urine NEGATIVE NEGATIVE   Ketones, ur NEGATIVE NEGATIVE mg/dL   Protein, ur NEGATIVE NEGATIVE mg/dL   Nitrite NEGATIVE NEGATIVE   Leukocytes, UA LARGE (A) NEGATIVE   RBC / HPF 6-30 0 - 5 RBC/hpf   WBC, UA TOO NUMEROUS TO COUNT 0 - 5 WBC/hpf   Bacteria, UA RARE (A) NONE SEEN   Squamous Epithelial / LPF 6-30 (A) NONE SEEN   Mucus PRESENT   Basic metabolic panel  Result Value Ref Range   Sodium 140 135 - 145 mmol/L   Potassium 3.6 3.5 - 5.1 mmol/L   Chloride 107 101 - 111 mmol/L   CO2 24 22 - 32 mmol/L   Glucose, Bld 127 (H) 65 - 99 mg/dL   BUN 7  6 - 20 mg/dL   Creatinine, Ser 1.16 (H) 0.44 - 1.00 mg/dL   Calcium 8.6 (L) 8.9 - 10.3 mg/dL   GFR calc non Af Amer 59 (L) >60 mL/min   GFR calc Af Amer >60 >60 mL/min   Anion gap 9 5 - 15  CBC  Result Value Ref Range   WBC 7.4 3.6 - 11.0 K/uL   RBC 4.35 3.80 - 5.20 MIL/uL   Hemoglobin 13.2 12.0 - 16.0 g/dL   HCT 39.7 35.0 - 47.0 %   MCV 91.2 80.0 - 100.0 fL   MCH 30.3 26.0 - 34.0 pg   MCHC 33.3 32.0 - 36.0 g/dL   RDW 13.6 11.5 - 14.5 %   Platelets 215 150 - 440 K/uL   US Breast Ltd Uni Left Inc Axilla  Result Date: 12/19/2016 CLINICAL DATA:  37 year old female presenting for 10 day follow-up of a probable left subareolar developing abscess. Patient reports marked improvement of clinical symptoms since antibiotic treatment, with mild residual tenderness remaining. Additionally, at the time of clinical evaluation, there was a provider palpated left breast abnormality. The patient had a remote biopsy in the left breast. Pathology reports are not available, but the patient state the biopsied showed a fibroadenoma. EXAM: 2D DIGITAL DIAGNOSTIC BILATERAL MAMMOGRAM WITH  CAD AND ADJUNCT TOMO ULTRASOUND LEFT BREAST COMPARISON:  Previous exam(s). ACR Breast Density Category b: There are scattered areas of fibroglandular density. FINDINGS: A radiopaque BB was placed at the site of the provider palpated abnormality in the upper outer quadrant at middle depth. Immediately deep to the BB, a circumscribed, lobulated mass with post biopsy clip is demonstrated. The appearance is grossly stable from April 17, 2015 and slightly decreased in size from 2011-04-17. An additional circumscribed mass in the upper outer quadrant at posterior depth as seen on prior studies and likely representing an intramammary lymph node appears slightly enlarged compared to prior studies. Additional prominent left axillary lymph nodes are noted. Mammographic images were processed with CAD. Targeted ultrasound is performed, showing normal  fibroglandular tissue in the left subareolar region. No suspicious masses, cystic lesions or focal sonographic abnormalities are identified. Evaluation of the upper outer quadrant at the region of the patient's palpable abnormality demonstrates a lobulated hypoechoic mass at the 3 o'clock position 5 cm from the nipple. It measures 2 x 1.5 x 1.1 cm. This is stable in comparison to ultrasound studies from January of 2017 and 04/17/2011. No additional sonographic abnormalities are identified in this region. Additional evaluation of the left axilla demonstrates cortical thickening of an axillary lymph node up to 0.7 cm. Findings may be reactive from the patient's recent infection. IMPRESSION: 1. Interval resolution of previous imaging findings in the left subareolar region. The patient reports near complete resolution of her clinical symptoms after antibiotic therapy. No further imaging follow-up required. 2. Probably benign left axillary and intramammary lymphadenopathy likely related to the patient's recent infection. Recommendation is for mammographic and sonographic follow-up in 6-8 weeks to document resolution. 3. Benign left breast mass corresponding with the provider palpated abnormality. This corresponds to previously biopsied mass and is stable dating back to 04-17-2011. 4. No mammographic evidence of malignancy on the right. RECOMMENDATION: Diagnostic left breast mammogram and axillary ultrasound in 6-8 weeks. I have discussed the findings and recommendations with the patient. Results were also provided in writing at the conclusion of the visit. If applicable, a reminder letter will be sent to the patient regarding the next appointment. BI-RADS CATEGORY  3: Probably benign. Electronically Signed   By: Kristopher Oppenheim M.D.   On: 12/19/2016 15:14   Ms Digital Diag Tomo Bilat  Result Date: 12/19/2016 CLINICAL DATA:  37 year old female presenting for 10 day follow-up of a probable left subareolar developing abscess.  Patient reports marked improvement of clinical symptoms since antibiotic treatment, with mild residual tenderness remaining. Additionally, at the time of clinical evaluation, there was a provider palpated left breast abnormality. The patient had a remote biopsy in the left breast. Pathology reports are not available, but the patient state the biopsied showed a fibroadenoma. EXAM: 2D DIGITAL DIAGNOSTIC BILATERAL MAMMOGRAM WITH CAD AND ADJUNCT TOMO ULTRASOUND LEFT BREAST COMPARISON:  Previous exam(s). ACR Breast Density Category b: There are scattered areas of fibroglandular density. FINDINGS: A radiopaque BB was placed at the site of the provider palpated abnormality in the upper outer quadrant at middle depth. Immediately deep to the BB, a circumscribed, lobulated mass with post biopsy clip is demonstrated. The appearance is grossly stable from 2015-04-17 and slightly decreased in size from 04/17/11. An additional circumscribed mass in the upper outer quadrant at posterior depth as seen on prior studies and likely representing an intramammary lymph node appears slightly enlarged compared to prior studies. Additional prominent left axillary lymph nodes are noted. Mammographic images were processed with CAD.  Targeted ultrasound is performed, showing normal fibroglandular tissue in the left subareolar region. No suspicious masses, cystic lesions or focal sonographic abnormalities are identified. Evaluation of the upper outer quadrant at the region of the patient's palpable abnormality demonstrates a lobulated hypoechoic mass at the 3 o'clock position 5 cm from the nipple. It measures 2 x 1.5 x 1.1 cm. This is stable in comparison to ultrasound studies from January of 2017 and 04/11/11. No additional sonographic abnormalities are identified in this region. Additional evaluation of the left axilla demonstrates cortical thickening of an axillary lymph node up to 0.7 cm. Findings may be reactive from the patient's recent infection.  IMPRESSION: 1. Interval resolution of previous imaging findings in the left subareolar region. The patient reports near complete resolution of her clinical symptoms after antibiotic therapy. No further imaging follow-up required. 2. Probably benign left axillary and intramammary lymphadenopathy likely related to the patient's recent infection. Recommendation is for mammographic and sonographic follow-up in 6-8 weeks to document resolution. 3. Benign left breast mass corresponding with the provider palpated abnormality. This corresponds to previously biopsied mass and is stable dating back to 04-11-2011. 4. No mammographic evidence of malignancy on the right. RECOMMENDATION: Diagnostic left breast mammogram and axillary ultrasound in 6-8 weeks. I have discussed the findings and recommendations with the patient. Results were also provided in writing at the conclusion of the visit. If applicable, a reminder letter will be sent to the patient regarding the next appointment. BI-RADS CATEGORY  3: Probably benign. Electronically Signed   By: Kristopher Oppenheim M.D.   On: 12/19/2016 15:14

## 2017-01-13 ENCOUNTER — Emergency Department
Admission: EM | Admit: 2017-01-13 | Discharge: 2017-01-13 | Disposition: A | Discharge status: Home / Self Care | Payer: Self-pay | Attending: Emergency Medicine | Admitting: Emergency Medicine

## 2017-01-13 ENCOUNTER — Emergency Department: Payer: Self-pay

## 2017-01-13 ENCOUNTER — Encounter: Payer: Self-pay | Admitting: Emergency Medicine

## 2017-01-13 ENCOUNTER — Other Ambulatory Visit: Payer: Self-pay

## 2017-01-13 DIAGNOSIS — Z79899 Other long term (current) drug therapy: Secondary | ICD-10-CM | POA: Insufficient documentation

## 2017-01-13 DIAGNOSIS — I10 Essential (primary) hypertension: Secondary | ICD-10-CM | POA: Insufficient documentation

## 2017-01-13 DIAGNOSIS — J45909 Unspecified asthma, uncomplicated: Secondary | ICD-10-CM | POA: Insufficient documentation

## 2017-01-13 DIAGNOSIS — Z87891 Personal history of nicotine dependence: Secondary | ICD-10-CM | POA: Insufficient documentation

## 2017-01-13 DIAGNOSIS — N12 Tubulo-interstitial nephritis, not specified as acute or chronic: Secondary | ICD-10-CM | POA: Insufficient documentation

## 2017-01-13 LAB — CBC
HCT: 41.1 % (ref 35.0–47.0)
Hemoglobin: 13.6 g/dL (ref 12.0–16.0)
MCH: 30.2 pg (ref 26.0–34.0)
MCHC: 33.2 g/dL (ref 32.0–36.0)
MCV: 90.9 fL (ref 80.0–100.0)
PLATELETS: 246 10*3/uL (ref 150–440)
RBC: 4.52 MIL/uL (ref 3.80–5.20)
RDW: 13.6 % (ref 11.5–14.5)
WBC: 8.5 10*3/uL (ref 3.6–11.0)

## 2017-01-13 LAB — COMPREHENSIVE METABOLIC PANEL
ALK PHOS: 51 U/L (ref 38–126)
ALT: 24 U/L (ref 14–54)
AST: 15 U/L (ref 15–41)
Albumin: 4.1 g/dL (ref 3.5–5.0)
Anion gap: 10 (ref 5–15)
BUN: 12 mg/dL (ref 6–20)
CALCIUM: 9.2 mg/dL (ref 8.9–10.3)
CO2: 27 mmol/L (ref 22–32)
CREATININE: 1.38 mg/dL — AB (ref 0.44–1.00)
Chloride: 105 mmol/L (ref 101–111)
GFR, EST AFRICAN AMERICAN: 56 mL/min — AB (ref >60)
GFR, EST NON AFRICAN AMERICAN: 48 mL/min — AB (ref >60)
Glucose, Bld: 98 mg/dL (ref 65–99)
Potassium: 4 mmol/L (ref 3.5–5.1)
Sodium: 142 mmol/L (ref 135–145)
Total Bilirubin: 0.5 mg/dL (ref 0.3–1.2)
Total Protein: 7.4 g/dL (ref 6.5–8.1)

## 2017-01-13 LAB — LACTIC ACID, PLASMA: LACTIC ACID, VENOUS: 0.7 mmol/L (ref 0.5–1.9)

## 2017-01-13 MED ORDER — FENTANYL CITRATE (PF) 100 MCG/2ML IJ SOLN
50.0000 ug | INTRAMUSCULAR | Status: DC | PRN
  Administered 2017-01-13: 50 ug via INTRAVENOUS
  Filled 2017-01-13: qty 2

## 2017-01-13 MED ORDER — CEFTRIAXONE SODIUM IN DEXTROSE 20 MG/ML IV SOLN
INTRAVENOUS | Status: AC
  Filled 2017-01-13: qty 50

## 2017-01-13 MED ORDER — OXYCODONE-ACETAMINOPHEN 5-325 MG PO TABS: 1.0000 | ORAL_TABLET | Freq: Four times a day (QID) | ORAL | 0 refills | Status: DC | PRN

## 2017-01-13 MED ORDER — CEFDINIR 300 MG PO CAPS: 300.0000 mg | ORAL_CAPSULE | Freq: Two times a day (BID) | ORAL | 0 refills | Status: DC

## 2017-01-13 MED ORDER — DEXTROSE 5 % IV SOLN
2.0000 g | Freq: Once | INTRAVENOUS | Status: AC
  Administered 2017-01-13: 2 g via INTRAVENOUS
  Filled 2017-01-13: qty 2

## 2017-01-13 MED ORDER — SODIUM CHLORIDE 0.9 % IV BOLUS (SEPSIS)
1000.0000 mL | Freq: Once | INTRAVENOUS | Status: AC
  Administered 2017-01-13: 1000 mL via INTRAVENOUS

## 2017-01-13 MED ORDER — KETOROLAC TROMETHAMINE 30 MG/ML IJ SOLN
30.0000 mg | Freq: Once | INTRAMUSCULAR | Status: AC
  Administered 2017-01-13: 30 mg via INTRAVENOUS
  Filled 2017-01-13: qty 1

## 2017-01-13 MED ORDER — OXYCODONE-ACETAMINOPHEN 5-325 MG PO TABS
1.0000 | ORAL_TABLET | Freq: Once | ORAL | Status: AC
  Administered 2017-01-13: 1 via ORAL
  Filled 2017-01-13: qty 1

## 2017-01-13 MED ORDER — SODIUM CHLORIDE 0.9 % IV SOLN: INTRAVENOUS | Status: DC

## 2017-01-13 NOTE — ED Triage Notes (Signed)
First Nurse Note:  Arrives with C/O right flank pain.  Seen through ED on Friday, diagnosed with UTI.  Has taken 5 doses of Keflex, but symptoms have worsened.  VS WNL per EMS.

## 2017-01-13 NOTE — ED Provider Notes (Signed)
[facility] Emergency Department Provider Note  ____________________________________________   First MD Initiated Contact with Patient 01/13/17 1700     (approximate)  I have reviewed the triage vital signs and the nursing notes.   HISTORY  Chief Complaint Back Pain   HPI Barbara Pennington is a 37 y.o. female history of UTI as well as asthma who is presenting to the emergency department today with right flank pain that is worsening over the past week.  She was seen in the emergency department this past Friday night and was diagnosed with pyelonephritis.  She was discharged with Keflex, Norco as well as Zofran.  However, she did not start taking the Keflex until this morning.  She reports the pain is a 7 out of 10 now after 50 mcg of fentanyl.  She says the pain is sharp to the right flank.  Also with pain that is pressure-like to the suprapubic region.  Patient with a history of a hysterectomy.   Past Medical History:  Diagnosis Date  . Anxiety   . Arthritis 2006   rhuematoid - mild - no current issues  . Asthma 2003  . Complication of anesthesia    ran fever after c-section - had infection.  "Usually" has low temp after surgery.  . Hypertension 2013  . Lump or mass in breast 2013   RIGHT BREAST  . Motion sickness    repeated amusement park rides  . Scoliosis    no current issues  . Shortness of breath dyspnea    secondary to weight   . Ulcer 2001    Patient Active Problem List   Diagnosis Date Noted  . 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 Mastitis, left, acute 12/20/2016  . Hypertension     Past Surgical History:  Procedure Laterality Date  . ABDOMINAL HYSTERECTOMY  2008  . BREAST BIOPSY Left 2013   CORE - FIBROADENOMA VS PHYLLODES  . CESAREAN SECTION  2007  . COLONOSCOPY WITH PROPOFOL N/A 10/09/2016   Procedure: COLONOSCOPY WITH PROPOFOL;  Surgeon: Jonathon Bellows, MD;  Location: [facility] ENDOSCOPY;  Service: Gastroenterology;  Laterality:  N/A;  . GUM SURGERY  2004  . PLANTAR FASCIA RELEASE Left 08/19/2014   Procedure: ENDOSCOPIC PLANTAR FASCIOTOMY RELEASE L WITH TOPAZ;  Surgeon: Albertine Patricia, DPM;  Location: [place];  Service: Podiatry;  Laterality: Left;  LMA WITH POPLITEAL BLOCK  . TONSILLECTOMY  1984    Prior to Admission medications   Medication Sig Start Date End Date Taking? Authorizing Provider  acetaminophen (TYLENOL) 325 MG tablet Take by mouth.    [provider]  albuterol (PROVENTIL HFA;VENTOLIN HFA) 108 (90 Base) MCG/ACT inhaler Inhale 2 puffs into the lungs every 6 (six) hours as needed for wheezing or shortness of breath. 05/16/15   Johnn Hai, PA-C  BUPROPION HBR ER PO Take by mouth.    [provider]  cephALEXin (KEFLEX) 500 MG capsule Take 1 capsule (500 mg total) by mouth 4 (four) times daily for 14 days. 01/12/17 01/26/17  Darel Hong, MD  dicloxacillin (DYNAPEN) 500 MG capsule Take 1 capsule (500 mg total) by mouth 4 (four) times daily. 12/08/16   Carrie Mew, MD  escitalopram (LEXAPRO) 10 MG tablet Take by mouth.    [provider]  HYDROcodone-acetaminophen (NORCO) 5-325 MG tablet Take 1 tablet by mouth every 6 (six) hours as needed for up to 7 doses for severe pain. 01/12/17   Darel Hong, MD  meloxicam (MOBIC) 15 MG  tablet Take 1 tablet (15 mg total) by mouth daily. 02/06/16   Cuthriell, Charline Bills, PA-C  ondansetron (ZOFRAN) 4 MG tablet Take 1 tablet (4 mg total) by mouth daily as needed. 01/12/17 01/12/18  Darel Hong, MD    Allergies Latex  Family History  Problem Relation Age of Onset  . Testicular cancer Brother   . Colon cancer Paternal Grandfather   . Breast cancer Maternal Grandmother     Social History Social History   Tobacco Use  . Smoking status: Former Smoker    Packs/day: 0.25    Years: 10.00    Pack years: 2.50    Last attempt to quit: 07/05/2014    Years since quitting: 2.5  . Smokeless tobacco: Never Used  Substance  Use Topics  . Alcohol use: Yes    Comment: rare  . Drug use: No    Review of Systems  Constitutional: No fever/chills Eyes: No visual changes. ENT: No sore throat. Cardiovascular: Denies chest pain. Respiratory: Denies shortness of breath. Gastrointestinal: As above.  No nausea, no vomiting.  No diarrhea.  No constipation. Genitourinary: Negative for dysuria. Musculoskeletal: As above Skin: Negative for rash. Neurological: Negative for headaches, focal weakness or numbness.   ____________________________________________   PHYSICAL EXAM:  VITAL SIGNS: ED Triage Vitals  Enc Vitals Group     BP 01/13/17 1652 (!) 149/105     Pulse Rate 01/13/17 1652 96     Resp 01/13/17 1652 18     Temp 01/13/17 1652 (!) 100.7 F (38.2 C)     Temp Source 01/13/17 1652 Oral     SpO2 01/13/17 1652 97 %     Weight --      Height --      Head Circumference --      Peak Flow --      Pain Score 01/13/17 1650 10     Pain Loc --      Pain Edu? --      Excl. in [place]? --     Constitutional: Alert and oriented.  Appears uncomfortable. Eyes: Conjunctivae are normal.  Head: Atraumatic. Nose: No congestion/rhinnorhea. Mouth/Throat: Mucous membranes are moist.  Neck: No stridor.   Cardiovascular: Normal rate, regular rhythm. Grossly normal heart sounds.   Respiratory: Normal respiratory effort.  No retractions. Lungs CTAB. Gastrointestinal: Soft and nontender. No distention.  Right CVA tenderness to palpation. Musculoskeletal: No lower extremity tenderness nor edema.  No joint effusions. Neurologic:  Normal speech and language. No gross focal neurologic deficits are appreciated. Skin:  Skin is warm, dry and intact. No rash noted. Psychiatric: Mood and affect are normal. Speech and behavior are normal.  ____________________________________________   LABS (all labs ordered are listed, but only abnormal results are displayed)  Labs Reviewed  COMPREHENSIVE METABOLIC PANEL - Abnormal; Notable  for the following components:      Result Value   Creatinine, Ser 1.38 (*)    GFR calc non Af Amer 48 (*)    GFR calc Af Amer 56 (*)    All other components within normal limits  URINE CULTURE  CULTURE, BLOOD (ROUTINE X 2)  CULTURE, BLOOD (ROUTINE X 2)  CBC  LACTIC ACID, PLASMA  URINALYSIS, COMPLETE (UACMP) WITH MICROSCOPIC  URINALYSIS, ROUTINE W REFLEX MICROSCOPIC  LACTIC ACID, PLASMA   ____________________________________________  EKG   ____________________________________________  RADIOLOGY  No acute finding on the CAT scan. ____________________________________________   PROCEDURES  Procedure(s) performed:   Procedures  Critical Care performed:   ____________________________________________  INITIAL IMPRESSION / ASSESSMENT AND PLAN / ED COURSE  Pertinent labs & imaging results that were available during my care of the patient were reviewed by me and considered in my medical decision making (see chart for details).  Differential diagnosis includes, but is not limited to, ovarian cyst, ovarian torsion, acute appendicitis, diverticulitis, urinary tract infection/pyelonephritis, endometriosis, bowel obstruction, colitis, renal colic, gastroenteritis, hernia, fibroids, endometriosis, pregnancy related pain including ectopic pregnancy, etc. As part of my medical decision making, I reviewed the following data within the electronic MEDICAL RECORD NUMBER Notes from prior ED visits   ----------------------------------------- 7:29 PM on 01/13/2017 -----------------------------------------  Patient at this time with pain that is dramatically reduced.  I retook her temperature and it is 99.0.  Reassuring CAT scan reassuring lab work.  Will increase her antibiotic to Ceftin ear for more broad-spectrum coverage.  Her urine culture from Friday night appears to be growing E. coli but susceptibilities have not returned.  Also, the patient says that her Norco was not working at all.   She will be discharged with a prescription for 5 Percocet for alternative therapy.  She was originally prescribed 7 Norco.     ____________________________________________   FINAL CLINICAL IMPRESSION(S) / ED DIAGNOSES  Pyelonephritis.    NEW MEDICATIONS STARTED DURING THIS VISIT:  This SmartLink is deprecated. Use AVSMEDLIST instead to display the medication list for a patient.   Note:  This document was prepared using Dragon voice recognition software and may include unintentional dictation errors.     Orbie Pyo, MD 01/13/17 [phone]

## 2017-01-13 NOTE — ED Triage Notes (Signed)
Pt states that she was seen on Friday and diagnosed with a kidney infection. Pt states that the pain has not gotten any better. Pt crying in triage. Pt states that she has been taking her medication.

## 2017-01-13 NOTE — ED Notes (Signed)
Patient transported to CT 

## 2017-01-14 LAB — URINE CULTURE: Culture: >=100000 — AB

## 2017-01-18 LAB — CULTURE, BLOOD (ROUTINE X 2)
CULTURE: NO GROWTH
Culture: NO GROWTH
Specimen Description: ADEQUATE

## 2017-01-31 ENCOUNTER — Encounter: Payer: Self-pay | Admitting: General Surgery

## 2017-01-31 ENCOUNTER — Ambulatory Visit (INDEPENDENT_AMBULATORY_CARE_PROVIDER_SITE_OTHER): Payer: Self-pay | Admitting: General Surgery

## 2017-01-31 VITALS — BP 122/70 | HR 83 | Resp 13 | Ht 64.0 in | Wt 288.0 lb

## 2017-01-31 DIAGNOSIS — N61 Mastitis without abscess: Secondary | ICD-10-CM

## 2017-01-31 NOTE — Progress Notes (Signed)
Patient ID: Barbara Pennington, female   DOB: 1980/08/18, 37 y.o.   MRN: [id number]  Chief Complaint  Patient presents with  . Follow-up    HPI Barbara Pennington Hair is a 37 y.o. female.  Here for 6 week follow up left breast mastitis. She feels like the area is better. She does admit to a "pulling" sensation but it does not last liong and is not related to activity.  She is recovering from a kidney infection.  HPI  Past Medical History:  Diagnosis Date  . Anxiety   . Arthritis 2006   rhuematoid - mild - no current issues  . Asthma 2003  . Complication of anesthesia    ran fever after c-section - had infection.  "Usually" has low temp after surgery.  . Hypertension 2013  . Lump or mass in breast 2013   RIGHT BREAST  . Motion sickness    repeated amusement park rides  . Scoliosis    no current issues  . Shortness of breath dyspnea    secondary to weight   . Ulcer 2001    Past Surgical History:  Procedure Laterality Date  . ABDOMINAL HYSTERECTOMY  2008  . BREAST BIOPSY Left 2013   CORE - FIBROADENOMA VS PHYLLODES  . CESAREAN SECTION  2007  . COLONOSCOPY WITH PROPOFOL N/A 10/09/2016   Procedure: COLONOSCOPY WITH PROPOFOL;  Surgeon: Jonathon Bellows, MD;  Location: [facility] ENDOSCOPY;  Service: Gastroenterology;  Laterality: N/A;  . GUM SURGERY  2004  . PLANTAR FASCIA RELEASE Left 08/19/2014   Procedure: ENDOSCOPIC PLANTAR FASCIOTOMY RELEASE Pennington WITH TOPAZ;  Surgeon: Albertine Patricia, DPM;  Location: [place];  Service: Podiatry;  Laterality: Left;  LMA WITH POPLITEAL BLOCK  . TONSILLECTOMY  1984    Family History  Problem Relation Age of Onset  . Testicular cancer Brother   . Colon cancer Paternal Grandfather   . Breast cancer Maternal Grandmother     Social History Social History   Tobacco Use  . Smoking status: Former Smoker    Packs/day: 0.25    Years: 10.00    Pack years: 2.50    Last attempt to quit: 07/05/2014    Years since quitting: 2.5  . Smokeless tobacco: Never  Used  Substance Use Topics  . Alcohol use: Yes    Comment: rare  . Drug use: No    Allergies  Allergen Reactions  . Latex Rash    Current Outpatient Medications  Medication Sig Dispense Refill  . acetaminophen (TYLENOL) 325 MG tablet Take 650 mg by mouth every 6 (six) hours as needed.     Marland Kitchen albuterol (PROVENTIL HFA;VENTOLIN HFA) 108 (90 Base) MCG/ACT inhaler Inhale 2 puffs into the lungs every 6 (six) hours as needed for wheezing or shortness of breath. 1 Inhaler 2  . BUPROPION HBR ER PO Take by mouth.    . escitalopram (LEXAPRO) 10 MG tablet Take by mouth.     No current facility-administered medications for this visit.     Review of Systems Review of Systems  Constitutional: Negative.   Respiratory: Negative.   Cardiovascular: Negative.     Blood pressure 122/70, pulse 83, resp. rate 13, height 5\' 4"  (1.626 m), weight 288 lb (130.6 kg).  Physical Exam Physical Exam  Constitutional: She is oriented to person, place, and time. She appears well-developed and well-nourished.  Pulmonary/Chest: Left breast exhibits no inverted nipple, no mass, no nipple discharge, no skin change and no tenderness.    Lymphadenopathy:  She has no axillary adenopathy.  Neurological: She is alert and oriented to person, place, and time.  Skin: Skin is warm and dry.  Psychiatric: Her behavior is normal.    Data Reviewed Reviewed December recommendations from radiology for repeat imaging.  I do not feel this is indicated.  Assessment    Resolution of mastitis.  Triggering event unknown.    Plan    Patient is to call probably should she develop recurrent symptoms.  She will continue monthly self-examination.    Follow up as needed. The patient is aware to call back for any questions or new concerns.   HPI, Physical Exam, Assessment and Plan have been scribed under the direction and in the presence of Robert Bellow, MD. Karie Fetch, RN  I have completed the exam and  reviewed the above documentation for accuracy and completeness.  I agree with the above.  Haematologist has been used and any errors in dictation or transcription are unintentional.  Hervey Ard, M.D., F.A.C.S.  Forest Gleason Imara Standiford 02/02/2017, 7:07 AM

## 2017-01-31 NOTE — Patient Instructions (Signed)
The patient is aware to call back for any questions or concerns.  

## 2017-02-07 ENCOUNTER — Encounter: Payer: Self-pay | Admitting: *Deleted

## 2017-02-07 NOTE — Progress Notes (Signed)
Patient with complete resolution of mastitis per Dr. Dwyane Luo notes.  No need for 2 month follow up mammogram per his notes also.  Patient to return as needed or for screening at age 37.  HSIS to [place].

## 2017-02-18 ENCOUNTER — Encounter: Payer: Self-pay | Admitting: General Surgery

## 2017-06-13 ENCOUNTER — Encounter: Payer: Self-pay | Admitting: Emergency Medicine

## 2017-06-13 ENCOUNTER — Other Ambulatory Visit: Payer: Self-pay

## 2017-06-13 ENCOUNTER — Emergency Department: Payer: Self-pay

## 2017-06-13 ENCOUNTER — Emergency Department
Admission: EM | Admit: 2017-06-13 | Discharge: 2017-06-13 | Disposition: A | Discharge status: Home / Self Care | Payer: Self-pay | Attending: Emergency Medicine | Admitting: Emergency Medicine

## 2017-06-13 DIAGNOSIS — M722 Plantar fascial fibromatosis: Secondary | ICD-10-CM

## 2017-06-13 DIAGNOSIS — Y939 Activity, unspecified: Secondary | ICD-10-CM | POA: Insufficient documentation

## 2017-06-13 DIAGNOSIS — W010XXA Fall on same level from slipping, tripping and stumbling without subsequent striking against object, initial encounter: Secondary | ICD-10-CM | POA: Insufficient documentation

## 2017-06-13 DIAGNOSIS — Z79899 Other long term (current) drug therapy: Secondary | ICD-10-CM | POA: Insufficient documentation

## 2017-06-13 DIAGNOSIS — S93401A Sprain of unspecified ligament of right ankle, initial encounter: Secondary | ICD-10-CM | POA: Insufficient documentation

## 2017-06-13 DIAGNOSIS — Z87891 Personal history of nicotine dependence: Secondary | ICD-10-CM | POA: Insufficient documentation

## 2017-06-13 DIAGNOSIS — Y929 Unspecified place or not applicable: Secondary | ICD-10-CM | POA: Insufficient documentation

## 2017-06-13 DIAGNOSIS — J45909 Unspecified asthma, uncomplicated: Secondary | ICD-10-CM | POA: Insufficient documentation

## 2017-06-13 DIAGNOSIS — Y999 Unspecified external cause status: Secondary | ICD-10-CM | POA: Insufficient documentation

## 2017-06-13 DIAGNOSIS — I10 Essential (primary) hypertension: Secondary | ICD-10-CM | POA: Insufficient documentation

## 2017-06-13 MED ORDER — MELOXICAM 15 MG PO TABS: 15.0000 mg | ORAL_TABLET | Freq: Every day | ORAL | 2 refills | Status: DC

## 2017-06-13 NOTE — ED Triage Notes (Signed)
Pt reports that she woke up today and had pain in her right heel that felt like plantar fasciitis. When she steped out of bed she over corrected and hurt her right ankle. Pulses present no bruising seen.

## 2017-06-13 NOTE — Discharge Instructions (Addendum)
Follow-up with Dr. Elvina Mattes.  He has a podiatrist that can help you with your plantar fasciitis. You have a sprained ankle today;  you have no broken bones.  You do have a spur on the bottom of your foot which is creating some of the pain with the plantar fasciitis.  Apply ice to this area.  If you take a water bottle and freeze it and then roll it under your foot for 15 minutes a day this will help decrease inflammation in the area.  A heel lift in the shoe may also help.  You have also been given a prescription for meloxicam.  Take this as prescribed.

## 2017-06-13 NOTE — ED Provider Notes (Signed)
[facility] Emergency Department Provider Note  ____________________________________________   First MD Initiated Contact with Patient 06/13/17 1541     (approximate)  I have reviewed the triage vital signs and the nursing notes.   HISTORY  Chief Complaint Ankle Pain and Foot Pain    HPI Barbara Pennington is a 37 y.o. female presents emergency department complaining of right ankle and right foot pain.  She states that she stepped on her foot and it hurt really bad and that she twisted her ankle this morning.  She has a history of plantar fasciitis in the left foot.  States it feels similar.  She is also concerned because the ankle hurts now.  She denies any head injury.  She denies any other injuries from the fall.  Past Medical History:  Diagnosis Date  . Anxiety   . Arthritis 2006   rhuematoid - mild - no current issues  . Asthma 2003  . Complication of anesthesia    ran fever after c-section - had infection.  "Usually" has low temp after surgery.  . Hypertension 2013  . Lump or mass in breast 2013   RIGHT BREAST  . Motion sickness    repeated amusement park rides  . Scoliosis    no current issues  . Shortness of breath dyspnea    secondary to weight   . Ulcer 2001    Patient Active Problem List   Diagnosis Date Noted  . 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 Mastitis, left, acute 12/20/2016  . Hypertension     Past Surgical History:  Procedure Laterality Date  . ABDOMINAL HYSTERECTOMY  2008  . BREAST BIOPSY Left 2013   CORE - FIBROADENOMA VS PHYLLODES  . CESAREAN SECTION  2007  . COLONOSCOPY WITH PROPOFOL N/A 10/09/2016   Procedure: COLONOSCOPY WITH PROPOFOL;  Surgeon: Jonathon Bellows, MD;  Location: [facility] ENDOSCOPY;  Service: Gastroenterology;  Laterality: N/A;  . GUM SURGERY  2004  . PLANTAR FASCIA RELEASE Left 08/19/2014   Procedure: ENDOSCOPIC PLANTAR FASCIOTOMY RELEASE L WITH TOPAZ;  Surgeon: Albertine Patricia, DPM;  Location: [place];  Service: Podiatry;  Laterality: Left;  LMA WITH POPLITEAL BLOCK  . TONSILLECTOMY  1984    Prior to Admission medications   Medication Sig Start Date End Date Taking? Authorizing Provider  acetaminophen (TYLENOL) 325 MG tablet Take 650 mg by mouth every 6 (six) hours as needed.     [provider]  albuterol (PROVENTIL HFA;VENTOLIN HFA) 108 (90 Base) MCG/ACT inhaler Inhale 2 puffs into the lungs every 6 (six) hours as needed for wheezing or shortness of breath. 05/16/15   Johnn Hai, PA-C  BUPROPION HBR ER PO Take by mouth.    [provider]  escitalopram (LEXAPRO) 10 MG tablet Take by mouth.    [provider]  meloxicam (MOBIC) 15 MG tablet Take 1 tablet (15 mg total) by mouth daily. 06/13/17 06/13/18  Versie Starks, PA-C    Allergies Latex  Family History  Problem Relation Age of Onset  . Testicular cancer Brother   . Colon cancer Paternal Grandfather   . Breast cancer Maternal Grandmother     Social History Social History   Tobacco Use  . Smoking status: Former Smoker    Packs/day: 0.25    Years: 10.00    Pack years: 2.50    Last attempt to quit: 07/05/2014    Years since quitting: 2.9  . Smokeless tobacco: Never Used  Substance Use  Topics  . Alcohol use: Yes    Comment: rare  . Drug use: No    Review of Systems  Constitutional: No fever/chills Eyes: No visual changes. ENT: No sore throat. Respiratory: Denies cough Genitourinary: Negative for dysuria. Musculoskeletal: Negative for back pain.  Positive for right foot and ankle pain Skin: Negative for rash.    ____________________________________________   PHYSICAL EXAM:  VITAL SIGNS: ED Triage Vitals  Enc Vitals Group     BP 06/13/17 1502 138/90     Pulse Rate 06/13/17 1502 84     Resp 06/13/17 1502 16     Temp 06/13/17 1502 98.6 F (37 C)     Temp Source 06/13/17 1502 Oral     SpO2 06/13/17 1502 96 %     Weight 06/13/17 1503 290 lb (131.5 kg)      Height 06/13/17 1503 5\' 4"  (1.626 m)     Head Circumference --      Peak Flow --      Pain Score 06/13/17 1508 5     Pain Loc --      Pain Edu? --      Excl. in [place]? --     Constitutional: Alert and oriented. Well appearing and in no acute distress. Eyes: Conjunctivae are normal.  Head: Atraumatic. Nose: No congestion/rhinnorhea. Mouth/Throat: Mucous membranes are moist.   Cardiovascular: Normal rate, regular rhythm. Respiratory: Normal respiratory effort.  No retractions GU: deferred Musculoskeletal: FROM all extremities, warm and well perfused.  The right foot is tender along the plantar tendon.  The right ankle is minimally tender at the lateral malleolus.  Both ankles and feet are very large in size.  It is difficult to determine if there is swelling or not. Neurologic:  Normal speech and language.  Skin:  Skin is warm, dry and intact. No rash noted. Psychiatric: Mood and affect are normal. Speech and behavior are normal.  ____________________________________________   LABS (all labs ordered are listed, but only abnormal results are displayed)  Labs Reviewed - No data to display ____________________________________________   ____________________________________________  RADIOLOGY  X-ray of the right ankle is negative for any acute abnormalities.  There is a spur along the right foot  ____________________________________________   PROCEDURES  Procedure(s) performed: Ace wrap was applied by the tech.  The patient brought her own crutches  Procedures    ____________________________________________   INITIAL IMPRESSION / ASSESSMENT AND PLAN / ED COURSE  Pertinent labs & imaging results that were available during my care of the patient were reviewed by me and considered in my medical decision making (see chart for details).  Patient is 37 year old female presents emergency department complaining of right foot and ankle pain.  On physical exam the right foot is  tender along the plantar tendon.  The right ankle is tender at the lateral malleolus.  X-ray of the right ankle is negative.  There is a spur noted at the calcaneus  Explained the results to the patient.  She was given an Ace wrap for the ankle.  She was instructed on how to care for plantar fasciitis.  She is to use her crutches until she can bear weight without difficulty.  She is to follow-up with podiatry.  Phone number was provided for Dr. Elvina Mattes.  She is also given a prescription for meloxicam 15 mg daily.  And was also given a work note at her request for tonight and tomorrow.  He was discharged in stable condition     As  part of my medical decision making, I reviewed the following data within the [place] notes reviewed and incorporated, Old chart reviewed, Radiograph reviewed x-ray of the right ankle is negative, Notes from prior ED visits and [place] Controlled Substance Database  ____________________________________________   FINAL CLINICAL IMPRESSION(S) / ED DIAGNOSES  Final diagnoses:  Sprain of right ankle, unspecified ligament, initial encounter  Plantar fasciitis      NEW MEDICATIONS STARTED DURING THIS VISIT:  New Prescriptions   MELOXICAM (MOBIC) 15 MG TABLET    Take 1 tablet (15 mg total) by mouth daily.     Note:  This document was prepared using Dragon voice recognition software and may include unintentional dictation errors.    Versie Starks, PA-C 06/13/17 1616    Harvest Dark, MD 06/13/17 2227

## 2017-08-04 ENCOUNTER — Emergency Department: Payer: Worker's Compensation

## 2017-08-04 ENCOUNTER — Encounter: Payer: Self-pay | Admitting: Emergency Medicine

## 2017-08-04 ENCOUNTER — Other Ambulatory Visit: Payer: Self-pay

## 2017-08-04 ENCOUNTER — Emergency Department
Admission: EM | Admit: 2017-08-04 | Discharge: 2017-08-04 | Disposition: A | Discharge status: Home / Self Care | Payer: Worker's Compensation | Attending: Emergency Medicine | Admitting: Emergency Medicine

## 2017-08-04 DIAGNOSIS — I10 Essential (primary) hypertension: Secondary | ICD-10-CM | POA: Insufficient documentation

## 2017-08-04 DIAGNOSIS — S7012XA Contusion of left thigh, initial encounter: Secondary | ICD-10-CM | POA: Insufficient documentation

## 2017-08-04 DIAGNOSIS — M545 Low back pain, unspecified: Secondary | ICD-10-CM

## 2017-08-04 DIAGNOSIS — Y939 Activity, unspecified: Secondary | ICD-10-CM | POA: Insufficient documentation

## 2017-08-04 DIAGNOSIS — M069 Rheumatoid arthritis, unspecified: Secondary | ICD-10-CM | POA: Diagnosis not present

## 2017-08-04 DIAGNOSIS — Z79899 Other long term (current) drug therapy: Secondary | ICD-10-CM | POA: Insufficient documentation

## 2017-08-04 DIAGNOSIS — W19XXXA Unspecified fall, initial encounter: Secondary | ICD-10-CM

## 2017-08-04 DIAGNOSIS — Y9289 Other specified places as the place of occurrence of the external cause: Secondary | ICD-10-CM | POA: Insufficient documentation

## 2017-08-04 DIAGNOSIS — Y990 Civilian activity done for income or pay: Secondary | ICD-10-CM | POA: Diagnosis not present

## 2017-08-04 DIAGNOSIS — Z91040 Latex allergy status: Secondary | ICD-10-CM | POA: Insufficient documentation

## 2017-08-04 DIAGNOSIS — S39012A Strain of muscle, fascia and tendon of lower back, initial encounter: Secondary | ICD-10-CM | POA: Diagnosis not present

## 2017-08-04 DIAGNOSIS — W010XXA Fall on same level from slipping, tripping and stumbling without subsequent striking against object, initial encounter: Secondary | ICD-10-CM | POA: Insufficient documentation

## 2017-08-04 DIAGNOSIS — S7002XA Contusion of left hip, initial encounter: Secondary | ICD-10-CM

## 2017-08-04 DIAGNOSIS — S79812A Other specified injuries of left hip, initial encounter: Secondary | ICD-10-CM | POA: Diagnosis present

## 2017-08-04 MED ORDER — DIFLUNISAL 500 MG PO TABS: 500.0000 mg | ORAL_TABLET | Freq: Two times a day (BID) | ORAL | 0 refills | Status: DC

## 2017-08-04 MED ORDER — NAPROXEN 500 MG PO TABS: 500.0000 mg | ORAL_TABLET | Freq: Two times a day (BID) | ORAL | 0 refills | Status: DC

## 2017-08-04 NOTE — Discharge Instructions (Addendum)
Follow-up with your companies Workmen's Comp. doctor or [facility] acute care if any continued problems.  You may apply ice to your leg as needed for swelling and to decrease pain.  Begin taking Dolobid 500 mg twice daily with food.  Discontinue taking this medication if you experience any stomach upset.  Do not take this medication with Tylenol or ibuprofen.  You will need to take the discharge papers and Workmen's Comp. papers back to your employer today.  They will need to read your work note for light duty and determine what you will be doing for the next 3 days.  They will also advise you as to what Doctor or clinic you are to follow-up with if you continued to have problems.  Return to the emergency department if any severe worsening of your symptoms such as loss of bowel or bladder function.

## 2017-08-04 NOTE — ED Notes (Signed)
See triage note  Presents s/p fall  States she slipped in the shower  Hitting her left buttocks on a cart  Then felt like she may have twisted her lower back   Ambulates well to treatment area

## 2017-08-04 NOTE — ED Provider Notes (Signed)
[facility] Emergency Department Provider Note   ____________________________________________   First MD Initiated Contact with Patient 08/04/17 1206     (approximate)  I have reviewed the triage vital signs and the nursing notes.   HISTORY  Chief Complaint Fall (Work Comp)   HPI Barbara Pennington is a 37 y.o. female is here with complaint of left hip and leg pain after slipping in the freezer at work.  Patient states that she works at [employer] and was in the freezer when she fell straight down landing on her back and buttocks.  She is continued to have pain to her coccyx area and left upper thigh and hip area.  Patient denies any head injury or loss of consciousness.  She has continued to be ambulatory since her accident.  She has not taken any over-the-counter medication for this.  Currently she rates her pain as 3 out of 10.   Past Medical History:  Diagnosis Date  . Anxiety   . Arthritis 2006   rhuematoid - mild - no current issues  . Asthma 2003  . Complication of anesthesia    ran fever after c-section - had infection.  "Usually" has low temp after surgery.  . Hypertension 2013  . Lump or mass in breast 2013   RIGHT BREAST  . Motion sickness    repeated amusement park rides  . Scoliosis    no current issues  . Shortness of breath dyspnea    secondary to weight   . Ulcer 2001    Patient Active Problem List   Diagnosis Date Noted  . 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 Mastitis, left, acute 12/20/2016  . Hypertension     Past Surgical History:  Procedure Laterality Date  . ABDOMINAL HYSTERECTOMY  2008  . BREAST BIOPSY Left 2013   CORE - FIBROADENOMA VS PHYLLODES  . CESAREAN SECTION  2007  . COLONOSCOPY WITH PROPOFOL N/A 10/09/2016   Procedure: COLONOSCOPY WITH PROPOFOL;  Surgeon: Jonathon Bellows, MD;  Location: [facility] ENDOSCOPY;  Service: Gastroenterology;  Laterality: N/A;  . GUM SURGERY  2004  . PLANTAR FASCIA RELEASE Left  08/19/2014   Procedure: ENDOSCOPIC PLANTAR FASCIOTOMY RELEASE L WITH TOPAZ;  Surgeon: Albertine Patricia, DPM;  Location: [place];  Service: Podiatry;  Laterality: Left;  LMA WITH POPLITEAL BLOCK  . TONSILLECTOMY  1984    Prior to Admission medications   Medication Sig Start Date End Date Taking? Authorizing Provider  acetaminophen (TYLENOL) 325 MG tablet Take 650 mg by mouth every 6 (six) hours as needed.     [provider]  albuterol (PROVENTIL HFA;VENTOLIN HFA) 108 (90 Base) MCG/ACT inhaler Inhale 2 puffs into the lungs every 6 (six) hours as needed for wheezing or shortness of breath. 05/16/15   Johnn Hai, PA-C  BUPROPION HBR ER PO Take by mouth.    [provider]  diflunisal (DOLOBID) 500 MG TABS tablet Take 1 tablet (500 mg total) by mouth 2 (two) times daily. 08/04/17   Johnn Hai, PA-C  escitalopram (LEXAPRO) 10 MG tablet Take by mouth.    [provider]  meloxicam (MOBIC) 15 MG tablet Take 1 tablet (15 mg total) by mouth daily. 06/13/17 06/13/18  Versie Starks, PA-C    Allergies Latex  Family History  Problem Relation Age of Onset  . Testicular cancer Brother   . Colon cancer Paternal Grandfather   . Breast cancer Maternal Grandmother     Social History Social History  Tobacco Use  . Smoking status: Former Smoker    Packs/day: 0.25    Years: 10.00    Pack years: 2.50    Last attempt to quit: 07/05/2014    Years since quitting: 3.0  . Smokeless tobacco: Never Used  Substance Use Topics  . Alcohol use: Yes    Comment: rare  . Drug use: No    Review of Systems Constitutional: No fever/chills Cardiovascular: Denies chest pain. Respiratory: Denies shortness of breath. Gastrointestinal: No abdominal pain.  No nausea, no vomiting.  Musculoskeletal: Positive for left hip/upper thigh pain. Skin: Positive for ecchymosis. Neurological: Negative for headaches, focal weakness or  numbness. ___________________________________________   PHYSICAL EXAM:  VITAL SIGNS: ED Triage Vitals  Enc Vitals Group     BP 08/04/17 1145 139/84     Pulse Rate 08/04/17 1145 74     Resp 08/04/17 1145 18     Temp 08/04/17 1145 98.2 F (36.8 C)     Temp Source 08/04/17 1145 Oral     SpO2 08/04/17 1145 100 %     Weight 08/04/17 1146 300 lb (136.1 kg)     Height 08/04/17 1146 5\' 4"  (1.626 m)     Head Circumference --      Peak Flow --      Pain Score 08/04/17 1146 3     Pain Loc --      Pain Edu? --      Excl. in [place]? --    Constitutional: Alert and oriented. Well appearing and in no acute distress.  Morbidly obese. Eyes: Conjunctivae are normal.  Head: Atraumatic. Neck: No stridor.   Cardiovascular: Normal rate, regular rhythm. Grossly normal heart sounds.  Good peripheral circulation. Respiratory: Normal respiratory effort.  No retractions. Lungs CTAB. Gastrointestinal: Soft and nontender. No distention.  Musculoskeletal: On examination of the lumbar spine there is no gross deformity and no point tenderness on palpation of the vertebral bodies.  Range of motion is without restriction.  There is some tenderness on palpation of the left SI joint area without soft tissue swelling or ecchymosis.  Patient is able to move lower extremities without any difficulty.  Patient is able to weight-bear without difficulty.  Nontender bilateral knees and ankles to palpation.  Good muscle strength bilaterally and normal gait was noted.  Patient was able to get from a sitting position to standing and also walk to the stretcher without assistance. Neurologic:  Normal speech and language. No gross focal neurologic deficits are appreciated.  Skin:  Skin is warm, dry and intact.  There is an ecchymotic area on the left posterior thigh which measures approximately 6 cm x 3 cm.  Skin is intact.  Area is tender to palpation. Psychiatric: Mood and affect are normal. Speech and behavior are  normal.  ____________________________________________   LABS (all labs ordered are listed, but only abnormal results are displayed)  Labs Reviewed - No data to display  RADIOLOGY  ED MD interpretation:  Left hip and pelvis is negative for acute fracture.  Official radiology report(s): Dg Hip Unilat W Or Wo Pelvis 2-3 Views Left  Result Date: 08/04/2017 CLINICAL DATA:  Recent fall with left hip pain, initial encounter EXAM: DG HIP (WITH OR WITHOUT PELVIS) 2-3V LEFT COMPARISON:  None. FINDINGS: No acute fracture or dislocation is noted. No soft tissue abnormality is seen. Degenerative changes of the hip joint are noted. IMPRESSION: No acute abnormality noted. Electronically Signed   By: Inez Catalina M.D.   On: 08/04/2017 13:36  ____________________________________________   PROCEDURES  Procedure(s) performed: None  Procedures  Critical Care performed: No  ____________________________________________   INITIAL IMPRESSION / ASSESSMENT AND PLAN / ED COURSE  As part of my medical decision making, I reviewed the following data within the electronic MEDICAL RECORD NUMBER Notes from prior ED visits and [place] Controlled Substance Database  Patient presents with a Workmen's Comp. injury that occurred at [employer] [facility] [employer].  [place] drug screen was required by the supervisor who did not know that the patient had checked into the emergency department.  Patient was reassured that x-rays did not show any fractures.  Patient was disgruntled when given information about contusions and a prescription for naproxen.  Patient states that she needs more documentation and also more detailed information for her light duty note since this is Workmen's Comp.  Patient plans to get a lawyer to evaluate both her Workmen's Comp. claim and her hospital stay.  Patient was given detailed information about ice packs to her thigh as needed for pain and discomfort.  She was given a prescription for Dolobid to be taken twice a day  with food for inflammation and pain.  Patient was also given a detailed list of work restrictions for her light duty note that is to be returned to her supervisor today.  She is to follow-up with her companies Workmen's Comp. doctor or follow-up with [facility] acute care if any continued problems.  ____________________________________________   FINAL CLINICAL IMPRESSION(S) / ED DIAGNOSES  Final diagnoses:  Contusion of left hip and thigh, initial encounter  Lumbar strain, initial encounter  Acute low back pain without sciatica, unspecified back pain laterality  Fall, initial encounter     ED Discharge Orders        Ordered    naproxen (NAPROSYN) 500 MG tablet  2 times daily with meals,   Status:  Discontinued     08/04/17 1347    diflunisal (DOLOBID) 500 MG TABS tablet  2 times daily     08/04/17 1437       Note:  This document was prepared using Dragon voice recognition software and may include unintentional dictation errors.    Johnn Hai, PA-C 08/04/17 [place], [place], MD 08/15/17 1323

## 2017-08-04 NOTE — ED Notes (Signed)
New discharge instructions reviewed along with rx's  Verbalizes understanding of instructions

## 2017-08-04 NOTE — ED Triage Notes (Signed)
Pt arrived via POV with reports of slipping in freezer at work at [employer] on [facility], states she fell onto left leg and buttocks.  Pt states the pain is 3/10, pt ambulatory without difficulty.

## 2017-08-04 NOTE — ED Notes (Signed)
Went over discharge instructions and meds with pt   States she wanted to speak with provider   Provider made aware

## 2017-08-06 ENCOUNTER — Emergency Department: Payer: Worker's Compensation

## 2017-08-06 ENCOUNTER — Other Ambulatory Visit: Payer: Self-pay

## 2017-08-06 ENCOUNTER — Emergency Department
Admission: EM | Admit: 2017-08-06 | Discharge: 2017-08-06 | Disposition: A | Discharge status: Home / Self Care | Payer: Worker's Compensation | Attending: Emergency Medicine | Admitting: Emergency Medicine

## 2017-08-06 ENCOUNTER — Encounter: Payer: Self-pay | Admitting: Emergency Medicine

## 2017-08-06 DIAGNOSIS — J45909 Unspecified asthma, uncomplicated: Secondary | ICD-10-CM | POA: Insufficient documentation

## 2017-08-06 DIAGNOSIS — M546 Pain in thoracic spine: Secondary | ICD-10-CM

## 2017-08-06 DIAGNOSIS — Z91040 Latex allergy status: Secondary | ICD-10-CM | POA: Insufficient documentation

## 2017-08-06 DIAGNOSIS — Z79899 Other long term (current) drug therapy: Secondary | ICD-10-CM | POA: Insufficient documentation

## 2017-08-06 DIAGNOSIS — Z87891 Personal history of nicotine dependence: Secondary | ICD-10-CM | POA: Insufficient documentation

## 2017-08-06 DIAGNOSIS — I10 Essential (primary) hypertension: Secondary | ICD-10-CM | POA: Diagnosis not present

## 2017-08-06 DIAGNOSIS — M5441 Lumbago with sciatica, right side: Secondary | ICD-10-CM | POA: Insufficient documentation

## 2017-08-06 MED ORDER — PREDNISONE 50 MG PO TABS: 50.0000 mg | ORAL_TABLET | Freq: Every day | ORAL | 0 refills | Status: DC

## 2017-08-06 MED ORDER — METHOCARBAMOL 500 MG PO TABS: 500.0000 mg | ORAL_TABLET | Freq: Four times a day (QID) | ORAL | 0 refills | Status: DC

## 2017-08-06 NOTE — Discharge Instructions (Signed)
He may take Tylenol/acetaminophen in addition to prescribed medications.  No Motrin, Aleve, Advil, Naprosyn, naproxen, ibuprofen.  If you experience a headache, take Tylenol and drink caffeine and/or eat a piece of chocolate.  While taking medications over the next several days, ensure that you stand and move around at least once an hour.  Use heat on your spine, you may use ice on your extremities.

## 2017-08-06 NOTE — ED Provider Notes (Signed)
[facility] Emergency Department Provider Note  ____________________________________________  Time seen: Approximately 6:22 PM  I have reviewed the triage vital signs and the nursing notes.   HISTORY  Chief Complaint Back Pain    HPI Barbara Pennington is a 37 y.o. female who presents the emergency department complaining of neck, mid back and lower back pain after a fall.  Patient was seen in this department 2 days ago after slipping and falling at work.  Patient reports that she had stepped over an empty bread crate, and stepped on some ice in a walk-in freezer.  Patient reports that her right leg went out from underneath her causing her to fall.  She reports that she struck the bread crate with the left hip and landed on her right buttocks.  Patient did not hit her head or lose consciousness.  She is now endorsing neck, mid back and lower back pain.  She has had some intermittent numbness and tingling in bilateral upper extremities, pain radiating into the right lower extremity consistent with sciatica.  No bowel or bladder dysfunction, saddle anesthesia, paresthesias.  Patient was prescribed Dilobid for symptom relief but states that this medication is not alleviated all of her symptoms.  No other complaints at this time.    Past Medical History:  Diagnosis Date  . Anxiety   . Arthritis 2006   rhuematoid - mild - no current issues  . Asthma 2003  . Complication of anesthesia    ran fever after c-section - had infection.  "Usually" has low temp after surgery.  . Hypertension 2013  . Lump or mass in breast 2013   RIGHT BREAST  . Motion sickness    repeated amusement park rides  . Scoliosis    no current issues  . Shortness of breath dyspnea    secondary to weight   . Ulcer 2001    Patient Active Problem List   Diagnosis Date Noted  . 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 Mastitis, left, acute 12/20/2016  . Hypertension     Past Surgical History:   Procedure Laterality Date  . ABDOMINAL HYSTERECTOMY  2008  . BREAST BIOPSY Left 2013   CORE - FIBROADENOMA VS PHYLLODES  . CESAREAN SECTION  2007  . COLONOSCOPY WITH PROPOFOL N/A 10/09/2016   Procedure: COLONOSCOPY WITH PROPOFOL;  Surgeon: Jonathon Bellows, MD;  Location: [facility] ENDOSCOPY;  Service: Gastroenterology;  Laterality: N/A;  . GUM SURGERY  2004  . PLANTAR FASCIA RELEASE Left 08/19/2014   Procedure: ENDOSCOPIC PLANTAR FASCIOTOMY RELEASE L WITH TOPAZ;  Surgeon: Albertine Patricia, DPM;  Location: [place];  Service: Podiatry;  Laterality: Left;  LMA WITH POPLITEAL BLOCK  . TONSILLECTOMY  1984    Prior to Admission medications   Medication Sig Start Date End Date Taking? Authorizing Provider  acetaminophen (TYLENOL) 325 MG tablet Take 650 mg by mouth every 6 (six) hours as needed.     [provider]  albuterol (PROVENTIL HFA;VENTOLIN HFA) 108 (90 Base) MCG/ACT inhaler Inhale 2 puffs into the lungs every 6 (six) hours as needed for wheezing or shortness of breath. 05/16/15   Johnn Hai, PA-C  BUPROPION HBR ER PO Take by mouth.    [provider]  diflunisal (DOLOBID) 500 MG TABS tablet Take 1 tablet (500 mg total) by mouth 2 (two) times daily. 08/04/17   Johnn Hai, PA-C  escitalopram (LEXAPRO) 10 MG tablet Take by mouth.    [provider]  meloxicam [facility])  15 MG tablet Take 1 tablet (15 mg total) by mouth daily. 06/13/17 06/13/18  Fisher, Linden Dolin, PA-C  methocarbamol (ROBAXIN) 500 MG tablet Take 1 tablet (500 mg total) by mouth 4 (four) times daily. 08/06/17   Melissa Tomaselli, Charline Bills, PA-C  predniSONE (DELTASONE) 50 MG tablet Take 1 tablet (50 mg total) by mouth daily with breakfast. 08/06/17   Ilay Capshaw, Charline Bills, PA-C    Allergies Latex  Family History  Problem Relation Age of Onset  . Testicular cancer Brother   . Colon cancer Paternal Grandfather   . Breast cancer Maternal Grandmother     Social History Social History   Tobacco Use   . Smoking status: Former Smoker    Packs/day: 0.25    Years: 10.00    Pack years: 2.50    Last attempt to quit: 07/05/2014    Years since quitting: 3.0  . Smokeless tobacco: Never Used  Substance Use Topics  . Alcohol use: Yes    Comment: rare  . Drug use: No     Review of Systems  Constitutional: No fever/chills Eyes: No visual changes. No discharge ENT: No upper respiratory complaints. Cardiovascular: no chest pain. Respiratory: no cough. No SOB. Gastrointestinal: No abdominal pain.  No nausea, no vomiting.  Genitourinary: Negative for dysuria. No hematuria Musculoskeletal: Positive for neck, mid back, lower back pain. Skin: Negative for rash, abrasions, lacerations, ecchymosis. Neurological: Negative for headaches, focal weakness or numbness. 10-point ROS otherwise negative.  ____________________________________________   PHYSICAL EXAM:  VITAL SIGNS: ED Triage Vitals  Enc Vitals Group     BP 08/06/17 1805 (!) 157/97     Pulse Rate 08/06/17 1805 75     Resp 08/06/17 1805 18     Temp 08/06/17 1805 98.6 F (37 C)     Temp Source 08/06/17 1805 Oral     SpO2 08/06/17 1805 99 %     Weight 08/06/17 1803 300 lb (136.1 kg)     Height 08/06/17 1803 5\' 4"  (1.626 m)     Head Circumference --      Peak Flow --      Pain Score 08/06/17 1803 8     Pain Loc --      Pain Edu? --      Excl. in [place]? --      Constitutional: Alert and oriented. Well appearing and in no acute distress. Eyes: Conjunctivae are normal. PERRL. EOMI. Head: Atraumatic. Neck: No stridor.  No cervical spine tenderness to palpation.  Radial pulse intact bilateral upper extremities.  Sensation intact and equal in all dermatomal distributions bilaterally.  Patient reports that sensations are intact but is experiencing tingling to the left upper extremity during exam. Hematological/Lymphatic/Immunilogical: No cervical lymphadenopathy. Cardiovascular: Normal rate, regular rhythm. Normal S1 and S2.  Good  peripheral circulation. Respiratory: Normal respiratory effort without tachypnea or retractions. Lungs CTAB. Good air entry to the bases with no decreased or absent breath sounds. Gastrointestinal: Bowel sounds 4 quadrants. Soft and nontender to palpation. No guarding or rigidity. No palpable masses. No distention. No CVA tenderness. Musculoskeletal: Full range of motion to all extremities. No gross deformities appreciated.  Patient is tender to palpation to the thoracic spine, round T7-T9 range.  No palpable abnormality or step-off.  No other tenderness to palpation of the thoracic spine.  No visible abnormality.  Visualization of the lumbar spine reveals no deformity or abnormality.  Diffuse lower midline tenderness in the L3-L5 region.  No palpable abnormality or step-off.  Patient is also  tender to palpation of the right-sided paraspinal muscle region and over the right-sided sciatic notch.  No other tenderness to palpation.  Dorsalis pedis pulse intact bilateral lower extremity's.  Sensation intact and equal in all dermatomal distributions bilateral lower extremities. Neurologic:  Normal speech and language. No gross focal neurologic deficits are appreciated.  Skin:  Skin is warm, dry and intact. No rash noted. Psychiatric: Mood and affect are normal. Speech and behavior are normal. Patient exhibits appropriate insight and judgement.   ____________________________________________   LABS (all labs ordered are listed, but only abnormal results are displayed)  Labs Reviewed - No data to display ____________________________________________  EKG   ____________________________________________  RADIOLOGY I personally viewed and evaluated these images as part of my medical decision making, as well as reviewing the written report by the radiologist.  I concur with radiologist finding of no acute osseous abnormality.  Mild thoracic curvature consistent with mild scoliosis.  Dg Cervical Spine 2-3  Views  Result Date: 08/06/2017 CLINICAL DATA:  Acute presentation with spinal pain. EXAM: CERVICAL SPINE - 2-3 VIEW COMPARISON:  03/12/2012 FINDINGS: No malalignment. No focal bone finding. No disc space narrowing. No facet arthropathy. IMPRESSION: Negative cervical spine radiographs. Electronically Signed   By: Nelson Chimes M.D.   On: 08/06/2017 19:05   Dg Thoracic Spine 2 View  Result Date: 08/06/2017 CLINICAL DATA:  Acute presentation with spinal pain. EXAM: THORACIC SPINE 2 VIEWS COMPARISON:  05/16/2015 FINDINGS: Very minimal spinal curvature, not significant. No focal bone lesion. No disc space narrowing. Posteromedial ribs appear normal. IMPRESSION: No significant finding. Minimal spinal curvature, not likely significant. Electronically Signed   By: Nelson Chimes M.D.   On: 08/06/2017 19:06   Dg Lumbar Spine Complete  Result Date: 08/06/2017 CLINICAL DATA:  Acute presentation with back pain EXAM: LUMBAR SPINE - COMPLETE 4+ VIEW COMPARISON:  CT 01/13/2017 FINDINGS: Five lumbar type vertebral bodies show normal alignment. No disc space narrowing. Mild lower lumbar facet arthritis. Sacroiliac joints appear normal. IMPRESSION: No advanced or likely significant finding. Mild lower lumbar facet arthritis. Electronically Signed   By: Nelson Chimes M.D.   On: 08/06/2017 19:07    ____________________________________________    PROCEDURES  Procedure(s) performed:    Procedures    Medications - No data to display   ____________________________________________   INITIAL IMPRESSION / ASSESSMENT AND PLAN / ED COURSE  Pertinent labs & imaging results that were available during my care of the patient were reviewed by me and considered in my medical decision making (see chart for details).  Review of the [place] CSRS was performed in accordance of the [place] prior to dispensing any controlled drugs.      Patient's diagnosis is consistent with acute mid and lower back pain after fall.  Patient  presents emergency department with pain to the mid and lower back.  She has had some intermittent numbness and tingling upper extremities, right-sided sciatica symptoms.  Differential included ruptured disc, compression fracture, contusion, strain.  Given patient's symptoms, likely muscular involvement causing mild radicular symptoms.  In addition to the anti-inflammatory prescribed, patient will be placed on short course of steroid and muscle relaxer.  Patient will follow-up with orthopedics for return to work clearance for her work comp claim..  Patient is given ED precautions to return to the ED for any worsening or new symptoms.     ____________________________________________  FINAL CLINICAL IMPRESSION(S) / ED DIAGNOSES  Final diagnoses:  Acute midline thoracic back pain  Acute midline low back pain with  right-sided sciatica      NEW MEDICATIONS STARTED DURING THIS VISIT:  ED Discharge Orders        Ordered    methocarbamol (ROBAXIN) 500 MG tablet  4 times daily     08/06/17 1957    predniSONE (DELTASONE) 50 MG tablet  Daily with breakfast     08/06/17 1957          This chart was dictated using voice recognition software/Dragon. Despite best efforts to proofread, errors can occur which can change the meaning. Any change was purely unintentional.    Darletta Moll, PA-C 08/06/17 2033    Arta Silence, MD 08/06/17 [phone]

## 2017-08-06 NOTE — ED Notes (Signed)
Pt reports being seen for lower back pain s/p fall at work Sunday morning. States she had hip XR done but no hip pain. Pt works for [employer] in [place]. Sunday's visit was under worker's compensation for said company.    Increased pain since fall. Difficulty sleeping. Reports waking up with right arm tingling twice.  C/o pain right sciatic area. Denies right hip pain.

## 2017-08-06 NOTE — ED Triage Notes (Addendum)
Pt presents to ED via POV with c/o back pain. Pt c/o back pain, states was seen on Sunday and dx with muscle strain and back pain s/p fall. Pt states some tingling to her arms, denies loss of control of bowel or bladder. Pt is ambulatory without difficulty on arrival to ED. Pt states medication she was sent home on has not been helping with the pain.

## 2017-08-15 ENCOUNTER — Encounter: Payer: Self-pay | Admitting: Intensive Care

## 2017-08-15 ENCOUNTER — Emergency Department
Admission: EM | Admit: 2017-08-15 | Discharge: 2017-08-15 | Disposition: A | Discharge status: Home / Self Care | Payer: Worker's Compensation | Attending: Emergency Medicine | Admitting: Emergency Medicine

## 2017-08-15 ENCOUNTER — Other Ambulatory Visit: Payer: Self-pay

## 2017-08-15 DIAGNOSIS — M549 Dorsalgia, unspecified: Secondary | ICD-10-CM | POA: Diagnosis not present

## 2017-08-15 DIAGNOSIS — I10 Essential (primary) hypertension: Secondary | ICD-10-CM | POA: Insufficient documentation

## 2017-08-15 DIAGNOSIS — Z79899 Other long term (current) drug therapy: Secondary | ICD-10-CM | POA: Insufficient documentation

## 2017-08-15 DIAGNOSIS — Z87891 Personal history of nicotine dependence: Secondary | ICD-10-CM | POA: Insufficient documentation

## 2017-08-15 DIAGNOSIS — J45909 Unspecified asthma, uncomplicated: Secondary | ICD-10-CM | POA: Diagnosis not present

## 2017-08-15 DIAGNOSIS — M5489 Other dorsalgia: Secondary | ICD-10-CM

## 2017-08-15 MED ORDER — HYDROCODONE-ACETAMINOPHEN 5-325 MG PO TABS: 1.0000 | ORAL_TABLET | ORAL | 0 refills | Status: DC | PRN

## 2017-08-15 MED ORDER — CYCLOBENZAPRINE HCL 10 MG PO TABS: 10.0000 mg | ORAL_TABLET | Freq: Three times a day (TID) | ORAL | 0 refills | Status: DC | PRN

## 2017-08-15 NOTE — ED Notes (Signed)
See triage note  conts to have back pain s/p fall 7/28  Denies any new injury  States she is here for pain control

## 2017-08-15 NOTE — ED Provider Notes (Signed)
[facility] Emergency Department Provider Note ____________________________________________  Time seen: Approximately 6:50 PM  I have reviewed the triage vital signs and the nursing notes.  HISTORY  Chief Complaint Back Pain   HPI Barbara Pennington is a 37 y.o. female presents to the emergency department for the third time due to low back pain.  She was seen here originally on July 28 after sustaining a mechanical, non-syncopal fall while at work.  She was treated with anti-inflammatories but did not get any pain relief from that.  The second time she came she had x-rays of her cervical spine, thoracic spine, and lumbar spine which were all negative for acute abnormality.  She was given prescriptions for robaxin and prednisone which provided no relief.  Past Medical History:  Diagnosis Date  . Anxiety   . Arthritis 2006   rhuematoid - mild - no current issues  . Asthma 2003  . Complication of anesthesia    ran fever after c-section - had infection.  "Usually" has low temp after surgery.  . Hypertension 2013  . Lump or mass in breast 2013   RIGHT BREAST  . Motion sickness    repeated amusement park rides  . Scoliosis    no current issues  . Shortness of breath dyspnea    secondary to weight   . Ulcer 2001    Patient Active Problem List   Diagnosis Date Noted  . 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 Mastitis, left, acute 12/20/2016  . Hypertension     Past Surgical History:  Procedure Laterality Date  . ABDOMINAL HYSTERECTOMY  2008  . BREAST BIOPSY Left 2013   CORE - FIBROADENOMA VS PHYLLODES  . CESAREAN SECTION  2007  . COLONOSCOPY WITH PROPOFOL N/A 10/09/2016   Procedure: COLONOSCOPY WITH PROPOFOL;  Surgeon: Jonathon Bellows, MD;  [facility] ENDOSCOPY;  Service: Gastroenterology;  Laterality: N/A;  . GUM SURGERY  2004  . PLANTAR FASCIA RELEASE Left 08/19/2014   Procedure: ENDOSCOPIC PLANTAR FASCIOTOMY RELEASE L WITH TOPAZ;  Surgeon: Albertine Patricia, DPM;  Location: [place];  Service: Podiatry;  Laterality: Left;  LMA WITH POPLITEAL BLOCK  . TONSILLECTOMY  1984    Prior to Admission medications   Medication Sig Start Date End Date Taking? Authorizing Provider  acetaminophen (TYLENOL) 325 MG tablet Take 650 mg by mouth every 6 (six) hours as needed.     [provider]  albuterol (PROVENTIL HFA;VENTOLIN HFA) 108 (90 Base) MCG/ACT inhaler Inhale 2 puffs into the lungs every 6 (six) hours as needed for wheezing or shortness of breath. 05/16/15   Johnn Hai, PA-C  BUPROPION HBR ER PO Take by mouth.    [provider]  cyclobenzaprine (FLEXERIL) 10 MG tablet Take 1 tablet (10 mg total) by mouth 3 (three) times daily as needed for muscle spasms. 08/15/17   Jaelen Soth B, FNP  diflunisal (DOLOBID) 500 MG TABS tablet Take 1 tablet (500 mg total) by mouth 2 (two) times daily. 08/04/17   Johnn Hai, PA-C  escitalopram (LEXAPRO) 10 MG tablet Take by mouth.    [provider]  HYDROcodone-acetaminophen (NORCO/VICODIN) 5-325 MG tablet Take 1 tablet by mouth every 4 (four) hours as needed for moderate pain. 08/15/17 08/15/18  Findley Blankenbaker, Johnette Abraham B, FNP  meloxicam (MOBIC) 15 MG tablet Take 1 tablet (15 mg total) by mouth daily. 06/13/17 06/13/18  Fisher, Linden Dolin, PA-C  methocarbamol (ROBAXIN) 500 MG tablet Take 1 tablet (500 mg total) by mouth  4 (four) times daily. 08/06/17   Cuthriell, Charline Bills, PA-C  predniSONE (DELTASONE) 50 MG tablet Take 1 tablet (50 mg total) by mouth daily with breakfast. 08/06/17   Cuthriell, Charline Bills, PA-C    Allergies Latex  Family History  Problem Relation Age of Onset  . Testicular cancer Brother   . Colon cancer Paternal Grandfather   . Breast cancer Maternal Grandmother     Social History Social History   Tobacco Use  . Smoking status: Former Smoker    Packs/day: 0.25    Years: 10.00    Pack years: 2.50    Last attempt to quit: 07/05/2014    Years since quitting:  3.1  . Smokeless tobacco: Never Used  Substance Use Topics  . Alcohol use: Yes    Comment: rare  . Drug use: No    Review of Systems Constitutional: Well appearing. Respiratory: Negative for dyspnea. Cardiovascular: Negative for change in skin temperature or color. Musculoskeletal:   Negative for chronic steroid use   Negative for trauma in the presence of osteoporosis  Negative for age over 18 and trauma.  Negative for constitutional symptoms, or history of cancer   Negative for pain worse at night. Skin: Negative for rash, lesion, or wound.  Genitourinary: Negative for urinary retention. Rectal: Negative for fecal incontinence or new onset constipation/bowel habit changes. Hematological/Immunilogical: Negative for immunosuppression, IV drug use, or fever Neurological: Negative for burning, tingling, numb, electric, radiating pain.                        Negative for saddle anesthesia.                        Negative for focal neurologic deficit, progressive or disabling symptoms             Negative for saddle anesthesia. ____________________________________________   PHYSICAL EXAM:  VITAL SIGNS: ED Triage Vitals  Enc Vitals Group     BP 08/15/17 1735 (!) 151/94     Pulse Rate 08/15/17 1735 79     Resp 08/15/17 1735 14     Temp 08/15/17 1735 99.1 F (37.3 C)     Temp Source 08/15/17 1735 Oral     SpO2 08/15/17 1735 97 %     Weight 08/15/17 1738 300 lb (136.1 kg)     Height 08/15/17 1738 5\' 4"  (1.626 m)     Head Circumference --      Peak Flow --      Pain Score 08/15/17 1738 7     Pain Loc --      Pain Edu? --      Excl. in [place]? --     Constitutional: Alert and oriented. Well appearing and in no acute distress. Eyes: Conjunctivae are clear without discharge or drainage.  Head: Atraumatic. Neck: Full, active range of motion. Respiratory: Respirations even and unlabored. Musculoskeletal: Limited ROM of the back and extremities, Strength 5/5 of the lower  extremities as tested. Neurologic: Reflexes of the lower extremities are 2+.  Negative straight leg raise on the right or left side. Skin: Atraumatic.  Psychiatric: Behavior and affect are normal.  ____________________________________________   LABS (all labs ordered are listed, but only abnormal results are displayed)  Labs Reviewed - No data to display ____________________________________________  RADIOLOGY  Not indicated ____________________________________________   PROCEDURES  Procedure(s) performed:  Procedures ____________________________________________   INITIAL IMPRESSION / ASSESSMENT AND PLAN / ED COURSE  Barbara Pennington is a 37 y.o. female who presents to the emergency department for treatment and evaluation of back pain after mechanical, non-syncopal fall while at work on July 28.  No new injury today.  Patient states that she was evaluated by an orthopedist who prescribed her a few tablets of Norco and scheduled her for physical therapy and an MRI.  She has taken all of the Norco and states that it was the only thing that provided her any relief whatsoever.  MRI is scheduled for next Thursday.  Patient was advised that I will submit a prescription for Flexeril and Norco but that it will likely be the last narcotic prescription that she will received from the emergency department.  She was advised that she should contact her orthopedist if she requires additional narcotic medications.  She states that she is having great difficulty standing for more than 15 or 20 minutes at a time and does not feel that she can work at [employer].  Note was provided for her to return after her evaluation by orthopedics following her MRI.  Medications - No data to display  ED Discharge Orders         Ordered    HYDROcodone-acetaminophen (NORCO/VICODIN) 5-325 MG tablet  Every 4 hours PRN     08/15/17 2042    cyclobenzaprine (FLEXERIL) 10 MG tablet  3 times daily PRN     08/15/17 2042            Pertinent labs & imaging results that were available during my care of the patient were reviewed by me and considered in my medical decision making (see chart for details).  _________________________________________   FINAL CLINICAL IMPRESSION(S) / ED DIAGNOSES  Final diagnoses:  Other back pain, unspecified chronicity     If controlled substance prescribed during this visit, 12 month history viewed on the [place] prior to issuing an initial prescription for Schedule II or III opiod.    Victorino Dike, FNP 08/15/17 2356    Arta Silence, MD 08/18/17 [phone]

## 2017-08-15 NOTE — ED Triage Notes (Signed)
Pt fell July 28th and ever since has been experiencing back pain. Has been seen in ER twice and by PCP once. Patient has MRI scheduled next week but was told until then to come back to ER for pain. Patient drove to ER today.

## 2017-08-15 NOTE — ED Notes (Signed)
FIRST NURSE NOTE:  Pt c/o back pain radiating up to shoulder blades has hx of the same, states she has been seen here for it in the past.

## 2017-08-20 DIAGNOSIS — M545 Low back pain, unspecified: Secondary | ICD-10-CM | POA: Insufficient documentation

## 2017-11-19 ENCOUNTER — Emergency Department
Admission: EM | Admit: 2017-11-19 | Discharge: 2017-11-19 | Disposition: A | Discharge status: Home / Self Care | Payer: Self-pay | Attending: Emergency Medicine | Admitting: Emergency Medicine

## 2017-11-19 ENCOUNTER — Encounter: Payer: Self-pay | Admitting: Emergency Medicine

## 2017-11-19 ENCOUNTER — Other Ambulatory Visit: Payer: Self-pay

## 2017-11-19 DIAGNOSIS — Z79899 Other long term (current) drug therapy: Secondary | ICD-10-CM | POA: Insufficient documentation

## 2017-11-19 DIAGNOSIS — Z91040 Latex allergy status: Secondary | ICD-10-CM | POA: Insufficient documentation

## 2017-11-19 DIAGNOSIS — Z87891 Personal history of nicotine dependence: Secondary | ICD-10-CM | POA: Insufficient documentation

## 2017-11-19 DIAGNOSIS — I10 Essential (primary) hypertension: Secondary | ICD-10-CM | POA: Insufficient documentation

## 2017-11-19 DIAGNOSIS — J45909 Unspecified asthma, uncomplicated: Secondary | ICD-10-CM | POA: Insufficient documentation

## 2017-11-19 LAB — CBC
HEMATOCRIT: 42.8 % (ref 36.0–46.0)
HEMOGLOBIN: 14.1 g/dL (ref 12.0–15.0)
MCH: 29.4 pg (ref 26.0–34.0)
MCHC: 32.9 g/dL (ref 30.0–36.0)
MCV: 89.2 fL (ref 80.0–100.0)
NRBC: 0 % (ref 0.0–0.2)
Platelets: 260 10*3/uL (ref 150–400)
RBC: 4.8 MIL/uL (ref 3.87–5.11)
RDW: 13.1 % (ref 11.5–15.5)
WBC: 8.6 10*3/uL (ref 4.0–10.5)

## 2017-11-19 LAB — BASIC METABOLIC PANEL
ANION GAP: 10 (ref 5–15)
BUN: 11 mg/dL (ref 6–20)
CHLORIDE: 104 mmol/L (ref 98–111)
CO2: 27 mmol/L (ref 22–32)
Calcium: 9.3 mg/dL (ref 8.9–10.3)
Creatinine, Ser: 1.12 mg/dL — ABNORMAL HIGH (ref 0.44–1.00)
GFR calc non Af Amer: >60 mL/min (ref >60)
Glucose, Bld: 99 mg/dL (ref 70–99)
POTASSIUM: 3.9 mmol/L (ref 3.5–5.1)
Sodium: 141 mmol/L (ref 135–145)

## 2017-11-19 LAB — URINALYSIS, COMPLETE (UACMP) WITH MICROSCOPIC
BILIRUBIN URINE: NEGATIVE
Glucose, UA: NEGATIVE mg/dL
Hgb urine dipstick: NEGATIVE
KETONES UR: NEGATIVE mg/dL
LEUKOCYTES UA: NEGATIVE
NITRITE: NEGATIVE
PROTEIN: NEGATIVE mg/dL
Specific Gravity, Urine: 1.006 (ref 1.005–1.030)
pH: 6 (ref 5.0–8.0)

## 2017-11-19 LAB — TROPONIN I: Troponin I: <0.03 ng/mL (ref <0.03)

## 2017-11-19 MED ORDER — HYDROCHLOROTHIAZIDE 25 MG PO TABS: 25.0000 mg | ORAL_TABLET | Freq: Every day | ORAL | 2 refills | Status: DC

## 2017-11-19 MED ORDER — HYDROCHLOROTHIAZIDE 25 MG PO TABS
25.0000 mg | ORAL_TABLET | Freq: Every day | ORAL | Status: DC
  Administered 2017-11-19: 25 mg via ORAL

## 2017-11-19 MED ORDER — HYDROCHLOROTHIAZIDE 25 MG PO TABS
ORAL_TABLET | ORAL | Status: AC
  Filled 2017-11-19: qty 1

## 2017-11-19 NOTE — ED Provider Notes (Signed)
[facility] Emergency Department Provider Note  Time seen: 9:58 PM  I have reviewed the triage vital signs and the nursing notes.   HISTORY  Chief Complaint Hypertension    HPI Barbara Pennington is a 37 y.o. female with a past medical history anxiety, asthma, hypertension, presents to the emergency department for high blood pressure.  According to the patient over the past for 5 months when she is periodically checked her blood pressure at pharmacies or at physical therapy has remained elevated between 150 and 180s.  Patient states today she was having headaches had a blood pressure checked and it remained elevated she became concerned so she came to the emergency department.  Denies any chest pain or shortness of breath at any point.  Patient states she is feeling better now, blood pressure is elevated currently 169/105.   Past Medical History:  Diagnosis Date  . Anxiety   . Arthritis 2006   rhuematoid - mild - no current issues  . Asthma 2003  . Complication of anesthesia    ran fever after c-section - had infection.  "Usually" has low temp after surgery.  . Hypertension 2013  . Lump or mass in breast 2013   RIGHT BREAST  . Motion sickness    repeated amusement park rides  . Scoliosis    no current issues  . Shortness of breath dyspnea    secondary to weight   . Ulcer 2001    Patient Active Problem List   Diagnosis Date Noted  . 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 Mastitis, left, acute 12/20/2016  . Hypertension     Past Surgical History:  Procedure Laterality Date  . ABDOMINAL HYSTERECTOMY  2008  . BREAST BIOPSY Left 2013   CORE - FIBROADENOMA VS PHYLLODES  . CESAREAN SECTION  2007  . COLONOSCOPY WITH PROPOFOL N/A 10/09/2016   Procedure: COLONOSCOPY WITH PROPOFOL;  Surgeon: Jonathon Bellows, MD;  Location: [facility] ENDOSCOPY;  Service: Gastroenterology;  Laterality: N/A;  . GUM SURGERY  2004  . PLANTAR FASCIA RELEASE Left 08/19/2014   Procedure:  ENDOSCOPIC PLANTAR FASCIOTOMY RELEASE L WITH TOPAZ;  Surgeon: Albertine Patricia, DPM;  Location: [place];  Service: Podiatry;  Laterality: Left;  LMA WITH POPLITEAL BLOCK  . TONSILLECTOMY  1984    Prior to Admission medications   Medication Sig Start Date End Date Taking? Authorizing Provider  acetaminophen (TYLENOL) 325 MG tablet Take 650 mg by mouth every 6 (six) hours as needed.     [provider]  albuterol (PROVENTIL HFA;VENTOLIN HFA) 108 (90 Base) MCG/ACT inhaler Inhale 2 puffs into the lungs every 6 (six) hours as needed for wheezing or shortness of breath. 05/16/15   Johnn Hai, PA-C  BUPROPION HBR ER PO Take by mouth.    [provider]  cyclobenzaprine (FLEXERIL) 10 MG tablet Take 1 tablet (10 mg total) by mouth 3 (three) times daily as needed for muscle spasms. 08/15/17   Triplett, Cari B, FNP  diflunisal (DOLOBID) 500 MG TABS tablet Take 1 tablet (500 mg total) by mouth 2 (two) times daily. 08/04/17   Johnn Hai, PA-C  escitalopram (LEXAPRO) 10 MG tablet Take by mouth.    [provider]  HYDROcodone-acetaminophen (NORCO/VICODIN) 5-325 MG tablet Take 1 tablet by mouth every 4 (four) hours as needed for moderate pain. 08/15/17 08/15/18  Triplett, Johnette Abraham B, FNP  meloxicam (MOBIC) 15 MG tablet Take 1 tablet (15 mg total) by mouth daily. 06/13/17 06/13/18  Caryn Section,  Linden Dolin, PA-C  methocarbamol (ROBAXIN) 500 MG tablet Take 1 tablet (500 mg total) by mouth 4 (four) times daily. 08/06/17   Cuthriell, Charline Bills, PA-C  predniSONE (DELTASONE) 50 MG tablet Take 1 tablet (50 mg total) by mouth daily with breakfast. 08/06/17   Cuthriell, Charline Bills, PA-C    Allergies  Allergen Reactions  . Latex Rash    Family History  Problem Relation Age of Onset  . Testicular cancer Brother   . Colon cancer Paternal Grandfather   . Breast cancer Maternal Grandmother     Social History Social History   Tobacco Use  . Smoking status: Former Smoker    Packs/day:  0.25    Years: 10.00    Pack years: 2.50    Last attempt to quit: 07/05/2014    Years since quitting: 3.3  . Smokeless tobacco: Never Used  Substance Use Topics  . Alcohol use: Yes    Comment: rare  . Drug use: No    Review of Systems Constitutional: Negative for fever. Cardiovascular: Negative for chest pain. Respiratory: Negative for shortness of breath. Gastrointestinal: Negative for abdominal pain, vomiting and diarrhea Musculoskeletal: Negative for musculoskeletal complaints Skin: Negative for skin complaints  Neurological: Positive for mild headache All other ROS negative  ____________________________________________   PHYSICAL EXAM:  VITAL SIGNS: ED Triage Vitals  Enc Vitals Group     BP 11/19/17 2049 (!) 169/105     Pulse Rate 11/19/17 2049 88     Resp 11/19/17 2049 20     Temp 11/19/17 2049 98.5 F (36.9 C)     Temp Source 11/19/17 2049 Oral     SpO2 11/19/17 2049 100 %     Weight 11/19/17 2050 300 lb (136.1 kg)     Height 11/19/17 2050 5\' 4"  (1.626 m)     Head Circumference --      Peak Flow --      Pain Score 11/19/17 2050 3     Pain Loc --      Pain Edu? --      Excl. in [place]? --    Constitutional: Alert and oriented. Well appearing and in no distress. Eyes: Normal exam ENT   Head: Normocephalic and atraumatic.   Mouth/Throat: Mucous membranes are moist. Cardiovascular: Normal rate, regular rhythm. No murmur Respiratory: Normal respiratory effort without tachypnea nor retractions. Breath sounds are clear  Gastrointestinal: Soft and nontender. No distention.  Musculoskeletal: Nontender with normal range of motion in all extremities.  Neurologic:  Normal speech and language. No gross focal neurologic deficits  Skin:  Skin is warm, dry and intact.  Psychiatric: Mood and affect are normal.   ____________________________________________    EKG  EKG reviewed and interpreted by myself shows normal sinus rhythm at 89 bpm with a narrow QRS, normal  axis, normal intervals, no concerning ST changes.  ____________________________________________  INITIAL IMPRESSION / ASSESSMENT AND PLAN / ED COURSE  Pertinent labs & imaging results that were available during my care of the patient were reviewed by me and considered in my medical decision making (see chart for details).  Patient presents the emergency department for high blood pressure ongoing for the past for 5 months possibly longer per patient.  She she has had high blood pressure in the past but is never been prescribed any medications.  Patient's work-up today in the emergency department is largely reassuring.  Labs are within normal limits, creatinine is slightly elevated 1.1, but GFR remains greater than 60, troponin negative EKG  is reassuring.  We will start the patient on hydrochlorothiazide 25 mg daily and have the patient follow-up with primary care doctor.  Patient agreeable to plan of care.  ____________________________________________   FINAL CLINICAL IMPRESSION(S) / ED DIAGNOSES  Hypertension    Harvest Dark, MD 11/19/17 2201

## 2017-11-19 NOTE — ED Triage Notes (Signed)
Pt presents to ED with elevated blood pressure. Pt states earlier today she felt light headed with headache. Pt states she checked her pressure and it was elevated X2. Not taking medications for elevated blood pressure but states it has been running high since her back injury in July.

## 2018-03-28 ENCOUNTER — Encounter

## 2018-04-04 ENCOUNTER — Encounter: Payer: Self-pay | Admitting: Family Medicine

## 2018-04-04 ENCOUNTER — Other Ambulatory Visit: Payer: Self-pay

## 2018-04-04 ENCOUNTER — Other Ambulatory Visit (HOSPITAL_COMMUNITY)
Admission: RE | Admit: 2018-04-04 | Discharge: 2018-04-04 | Disposition: A | Discharge status: Home / Self Care | Payer: Self-pay | Source: Ambulatory Visit | Attending: Family Medicine | Admitting: Family Medicine

## 2018-04-04 ENCOUNTER — Ambulatory Visit: Payer: Self-pay | Admitting: Family Medicine

## 2018-04-04 VITALS — BP 126/72 | HR 97 | Temp 98.5°F | Resp 14 | Ht 64.5 in | Wt 307.5 lb

## 2018-04-04 DIAGNOSIS — F331 Major depressive disorder, recurrent, moderate: Secondary | ICD-10-CM

## 2018-04-04 DIAGNOSIS — Z113 Encounter for screening for infections with a predominantly sexual mode of transmission: Secondary | ICD-10-CM | POA: Insufficient documentation

## 2018-04-04 DIAGNOSIS — I10 Essential (primary) hypertension: Secondary | ICD-10-CM

## 2018-04-04 DIAGNOSIS — J4520 Mild intermittent asthma, uncomplicated: Secondary | ICD-10-CM

## 2018-04-04 DIAGNOSIS — Z1159 Encounter for screening for other viral diseases: Secondary | ICD-10-CM

## 2018-04-04 DIAGNOSIS — Z7689 Persons encountering health services in other specified circumstances: Secondary | ICD-10-CM

## 2018-04-04 DIAGNOSIS — F603 Borderline personality disorder: Secondary | ICD-10-CM

## 2018-04-04 DIAGNOSIS — Z833 Family history of diabetes mellitus: Secondary | ICD-10-CM

## 2018-04-04 DIAGNOSIS — F4310 Post-traumatic stress disorder, unspecified: Secondary | ICD-10-CM

## 2018-04-04 DIAGNOSIS — F419 Anxiety disorder, unspecified: Secondary | ICD-10-CM

## 2018-04-04 DIAGNOSIS — R0683 Snoring: Secondary | ICD-10-CM

## 2018-04-04 DIAGNOSIS — Z6841 Body Mass Index (BMI) 40.0 and over, adult: Secondary | ICD-10-CM

## 2018-04-04 DIAGNOSIS — R400 Somnolence: Secondary | ICD-10-CM

## 2018-04-04 DIAGNOSIS — Z23 Encounter for immunization: Secondary | ICD-10-CM

## 2018-04-04 DIAGNOSIS — Z13220 Encounter for screening for lipoid disorders: Secondary | ICD-10-CM

## 2018-04-04 NOTE — Progress Notes (Signed)
Name: Barbara Pennington   MRN: [id number]    DOB: 1980-09-24   Date:04/04/2018       Progress Note  Subjective  Chief Complaint  Chief Complaint  Patient presents with  . New Patient (Initial Visit)  . Medication Refill    HPI  Psychiatric Concerns: She was diagnosed with BPD, PTSD, Depression, and Anxiety. She has always struggled with anxiety, but was much worse after she developed PTSD in 2009/2010.  She has had 3 prior suicide attempts - she has attempted overdose on each occasion.  She was not hospitalized but did seek help with the national suicide hotline. Has never been hospitalized. Denies SI/HI.   HTN: She was diagnosed in the ER in November 2019 and was but on HCTZ 25mg . At that time, her creatinine was slightly elevated.  She notes history of multiple TI's and pyelonephritis and worries about her kidney functions.  She is trying to follow a low salt diet. Denies chest pain.  She endorses ongoing BLE edema that does dissipate with elevation - ongoing for about 20 years.  Does get ShOB sometimes - situational, sometimes allergy related and deconditioning from being overweight.  EKG from 11/2017 does not show LVH.  Overweight: In high school she was about 180lbs, after having her children she reached her heaviest weight at 260lbs.  In 2009/2010 - she went through a really bad experience and got down to less than 180 again.  After she got out of this "bad situation" she started to slowly gain the weight back, had plantar fasciitis surgery in 2016 and gained even more weight while immobilized.  Depression contributes to her overeating.  She notes heat intolerance ongoing for quite some time.  OSA?/Asthma:   She snores loudly, has daytime tiredness, does not wake up coughing/gasping for air.  She was diagnosed in her early 20's with asthma, but after stopping smoking things improved.  Now that she has gained weight back her shortness of breath has returned.  STI Screenings: 1 partner in  the last year. No abnormal discharge, no vaginal or abdominal pain.  She notes pruritic areas on her upper thighs that she has been picking at and has not healed completely.  Patient Active Problem List   Diagnosis Date Noted  . Low back pain 08/20/2017  . 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 Mastitis, left, acute 12/20/2016  . Hypertension     Past Surgical History:  Procedure Laterality Date  . ABDOMINAL HYSTERECTOMY  2008  . BREAST BIOPSY Left 2013   CORE - FIBROADENOMA VS PHYLLODES  . CESAREAN SECTION  2007  . COLONOSCOPY WITH PROPOFOL N/A 10/09/2016   Procedure: COLONOSCOPY WITH PROPOFOL;  Surgeon: Jonathon Bellows, MD;  Location: [facility] ENDOSCOPY;  Service: Gastroenterology;  Laterality: N/A;  . GUM SURGERY  2004  . PLANTAR FASCIA RELEASE Left 08/19/2014   Procedure: ENDOSCOPIC PLANTAR FASCIOTOMY RELEASE L WITH TOPAZ;  Surgeon: Albertine Patricia, DPM;  Location: [place];  Service: Podiatry;  Laterality: Left;  LMA WITH POPLITEAL BLOCK  . TONSILLECTOMY  1984    Family History  Problem Relation Age of Onset  . Testicular cancer Brother   . Colon cancer Paternal Grandfather   . Breast cancer Maternal Grandmother   . Hypertension Maternal Grandmother   . Aneurysm Maternal Grandmother   . Stroke Maternal Grandmother   . Obesity Mother   . Diabetes Mother   . High Cholesterol Mother   . Hypertension Mother   . Neuropathy Mother   .  Depression Mother   . Anxiety disorder Mother   . Obesity Father   . Hypertension Father   . High Cholesterol Father   . Depression Father   . Anxiety disorder Father   . Hypertension Maternal Grandfather   . High Cholesterol Maternal Grandfather   . Heart disease Maternal Grandfather        has a pacemaker  . Stroke Maternal Grandfather   . Alzheimer's disease Paternal Grandmother   . Dementia Paternal Grandmother     Social History   Socioeconomic History  . Marital status: Divorced    Spouse name: Not on file  . Number of  children: 2  . Years of education: 38  . Highest education level: Bachelor's degree (e.g., BA, AB, BS)  Occupational History  . Not on file  Social Needs  . Financial resource strain: Not hard at all  . Food insecurity:    Worry: Never true    Inability: Never true  . Transportation needs:    Medical: No    Non-medical: No  Tobacco Use  . Smoking status: Former Smoker    Packs/day: 0.25    Years: 10.00    Pack years: 2.50    Last attempt to quit: 07/05/2014    Years since quitting: 3.7  . Smokeless tobacco: Never Used  Substance and Sexual Activity  . Alcohol use: Yes    Comment: rare  . Drug use: No  . Sexual activity: Not on file  Lifestyle  . Physical activity:    Days per week: 0 days    Minutes per session: 0 min  . Stress: Rather much  Relationships  . Social connections:    Talks on phone: More than three times a week    Gets together: Never    Attends religious service: Never    Active member of club or organization: No    Attends meetings of clubs or organizations: Never    Relationship status: Divorced  . Intimate partner violence:    Fear of current or ex partner: Patient refused    Emotionally abused: Patient refused    Physically abused: Patient refused    Forced sexual activity: Patient refused  Other Topics Concern  . Not on file  Social History Narrative  . Not on file     Current Outpatient Medications:  .  acetaminophen (TYLENOL) 325 MG tablet, Take 650 mg by mouth every 6 (six) hours as needed. , Disp: , Rfl:  .  albuterol (PROVENTIL HFA;VENTOLIN HFA) 108 (90 Base) MCG/ACT inhaler, Inhale 2 puffs into the lungs every 6 (six) hours as needed for wheezing or shortness of breath., Disp: 1 Inhaler, Rfl: 2 .  buPROPion (WELLBUTRIN SR) 150 MG 12 hr tablet, Take 150 mg by mouth 2 (two) times daily., Disp: , Rfl:  .  escitalopram (LEXAPRO) 20 MG tablet, Take 20 mg by mouth daily. , Disp: , Rfl:  .  hydrochlorothiazide (HYDRODIURIL) 25 MG tablet, Take  1 tablet (25 mg total) by mouth daily., Disp: 30 tablet, Rfl: 2 .  hydrOXYzine (ATARAX/VISTARIL) 25 MG tablet, Take 50 mg by mouth 2 (two) times daily., Disp: , Rfl:  .  BUPROPION HBR ER PO, Take by mouth., Disp: , Rfl:   Allergies  Allergen Reactions  . Latex Rash    I personally reviewed active problem list, medication list, allergies, health maintenance, notes from last encounter, lab results with the patient/caregiver today.   ROS  Ten systems reviewed and is negative except as mentioned  in HPI.  Objective  Vitals:   04/04/18 0752  BP: 126/72  Pulse: 97  Resp: 14  Temp: 98.5 F (36.9 C)  SpO2: 94%  Weight: (!) 307 lb 8 oz (139.5 kg)  Height: 5' 4.5" (1.638 m)   Body mass index is 51.97 kg/m.  Physical Exam Constitutional: Patient appears well-developed and well-nourished. No distress.  HENT: Head: Normocephalic and atraumatic. Eyes: Conjunctivae and EOM are normal. No scleral icterus.  Neck: Normal range of motion. Neck supple. No JVD present. No thyromegaly present.  Cardiovascular: Normal rate, regular rhythm and normal heart sounds.  No murmur heard. No BLE edema. Pulmonary/Chest: Effort normal and breath sounds normal. No respiratory distress. Musculoskeletal: Normal range of motion, no joint effusions. No gross deformities Neurological: Pt is alert and oriented to person, place, and time. No cranial nerve deficit. Coordination, balance, strength, speech and gait are normal.  Skin: Skin is warm and dry. No rash noted. No erythema.  Psychiatric: Patient has a normal mood and affect. behavior is normal. Judgment and thought content normal.  No results found for this or any previous visit (from the past 72 hour(s)).  PHQ2/9: Depression screen PHQ 2/9 04/04/2018  Decreased Interest 0  Down, Depressed, Hopeless 0  PHQ - 2 Score 0  Altered sleeping 3  Tired, decreased energy 3  Change in appetite 3  Feeling bad or failure about yourself  1  Trouble concentrating  1  Moving slowly or fidgety/restless 0  Suicidal thoughts 0  PHQ-9 Score 11  Difficult doing work/chores Somewhat difficult   PHQ-2/9 Result is positive.    GAD 7 : Generalized Anxiety Score 04/04/2018  Nervous, Anxious, on Edge 2  Control/stop worrying 3  Worry too much - different things 3  Trouble relaxing 3  Restless 3  Easily annoyed or irritable 3  Afraid - awful might happen 1  Total GAD 7 Score 18  Anxiety Difficulty Somewhat difficult    Fall Risk: Fall Risk  04/04/2018  Falls in the past year? 1  Number falls in past yr: 0  Injury with Fall? 1   Functional Status Survey: Is the patient deaf or have difficulty hearing?: No Does the patient have difficulty seeing, even when wearing glasses/contacts?: No Does the patient have difficulty concentrating, remembering, or making decisions?: No Does the patient have difficulty walking or climbing stairs?: No Does the patient have difficulty dressing or bathing?: No Does the patient have difficulty doing errands alone such as visiting a doctor's office or shopping?: No  Assessment & Plan  1. Moderate episode of recurrent major depressive disorder ([place]) - Ambulatory referral to Psychiatry  2. Anxiety - Ambulatory referral to Psychiatry  3. Borderline personality disorder ([place]) - Ambulatory referral to Psychiatry  4. PTSD (post-traumatic stress disorder) - Ambulatory referral to Psychiatry  5. Encounter to establish care  6. Mild intermittent asthma without complication - Ambulatory referral to Pulmonology  7. Family history of diabetes mellitus - COMPLETE METABOLIC PANEL WITH GFR - Hemoglobin A1c  8. Morbid obesity with BMI of 50.0-59.9, adult (HCC) - TSH - CBC w/Diff/Platelet  9. Loud snoring - CBC w/Diff/Platelet - Ambulatory referral to Pulmonology  10. Daytime sleepiness - CBC w/Diff/Platelet - Ambulatory referral to Pulmonology  11. Essential hypertension - COMPLETE METABOLIC PANEL WITH GFR -  Urine Microalbumin w/creat. ratio - TSH - CBC w/Diff/Platelet  12. Routine screening for STI (sexually transmitted infection) - HIV Antibody (routine testing w rflx) - Cervicovaginal ancillary only - RPR  13. Need for  hepatitis C screening test - Hepatitis C antibody  14. Lipid screening - Lipid panel  15. Need for Tdap vaccination - Tdap vaccine greater than or equal to 7yo IM

## 2018-04-04 NOTE — Patient Instructions (Addendum)
Here are some resources to help you if you feel you are in a mental health crisis:  [place] - Call [phone]  for help - Website with more resources: GripTrip.com.pt  [facility] - Call 8150271283 for help. - Mobile Crisis Program available 24 hours a day, 365 days a year. - Available for anyone of any age in [place] counties.  [facility] - Address: [address], [place] - Telephone: [phone]  - Hours of Operation: Sunday - Saturday - 8:00 a.m. - 8:00 p.m. - Medicaid, Medicare (Government Issued Only), BCBS, and [facility], [place], Psychiatrists on-site to provide medication management, [place], and Peer Support Care.  [facility] - Call [phone] for help. - Mobile Crisis Program available 24 hours a day, 365 days a year. - Available for anyone of any age in [place] stands for "Dietary Approaches to Stop Hypertension." The DASH eating plan is a healthy eating plan that has been shown to reduce high blood pressure (hypertension). It may also reduce your risk for type 2 diabetes, heart disease, and stroke. The DASH eating plan may also help with weight loss. What are tips for following this plan?  General guidelines  Avoid eating more than 2,300 mg (milligrams) of salt (sodium) a day. If you have hypertension, you may need to reduce your sodium intake to 1,500 mg a day.  Limit alcohol intake to no more than 1 drink a day for nonpregnant women and 2 drinks a day for men. One drink equals 12 oz of beer, 5 oz of wine, or 1 oz of hard liquor.  Work with your health care provider to maintain a healthy body weight or to lose weight. Ask what an ideal weight is for you.  Get at least 30 minutes of exercise that  causes your heart to beat faster (aerobic exercise) most days of the week. Activities may include walking, swimming, or biking.  Work with your health care provider or diet and nutrition specialist (dietitian) to adjust your eating plan to your individual calorie needs. Reading food labels   Check food labels for the amount of sodium per serving. Choose foods with less than 5 percent of the Daily Value of sodium. Generally, foods with less than 300 mg of sodium per serving fit into this eating plan.  To find whole grains, look for the word "whole" as the first word in the ingredient list. Shopping  Buy products labeled as "low-sodium" or "no salt added."  Buy fresh foods. Avoid canned foods and premade or frozen meals. Cooking  Avoid adding salt when cooking. Use salt-free seasonings or herbs instead of table salt or sea salt. Check with your health care provider or pharmacist before using salt substitutes.  Do not fry foods. Cook foods using healthy methods such as baking, boiling, grilling, and broiling instead.  Cook with heart-healthy oils, such as olive, canola, soybean, or sunflower oil. Meal planning  Eat a balanced diet that includes: ? 5 or more servings of fruits and vegetables each day. At each meal, try to fill half of your plate with fruits and vegetables. ? Up to 6-8 servings of whole grains each day. ? Less than 6 oz of lean meat, poultry, or fish each day. A 3-oz serving of meat is about the same size as a deck of cards. One egg  equals 1 oz. ? 2 servings of low-fat dairy each day. ? A serving of nuts, seeds, or beans 5 times each week. ? Heart-healthy fats. Healthy fats called Omega-3 fatty acids are found in foods such as flaxseeds and coldwater fish, like sardines, salmon, and mackerel.  Limit how much you eat of the following: ? Canned or prepackaged foods. ? Food that is high in trans fat, such as fried foods. ? Food that is high in saturated fat, such as fatty  meat. ? Sweets, desserts, sugary drinks, and other foods with added sugar. ? Full-fat dairy products.  Do not salt foods before eating.  Try to eat at least 2 vegetarian meals each week.  Eat more home-cooked food and less restaurant, buffet, and fast food.  When eating at a restaurant, ask that your food be prepared with less salt or no salt, if possible. What foods are recommended? The items listed may not be a complete list. Talk with your dietitian about what dietary choices are best for you. Grains Whole-grain or whole-wheat bread. Whole-grain or whole-wheat pasta. Brown rice. [place]. Bulgur. Whole-grain and low-sodium cereals. Pita bread. Low-fat, low-sodium crackers. Whole-wheat flour tortillas. Vegetables Fresh or frozen vegetables (raw, steamed, roasted, or grilled). Low-sodium or reduced-sodium tomato and vegetable juice. Low-sodium or reduced-sodium tomato sauce and tomato paste. Low-sodium or reduced-sodium canned vegetables. Fruits All fresh, dried, or frozen fruit. Canned fruit in natural juice (without added sugar). Meat and other protein foods Skinless chicken or [place]. Ground chicken or [place]. Pork with fat trimmed off. Fish and seafood. Egg whites. Dried beans, peas, or lentils. Unsalted nuts, nut butters, and seeds. Unsalted canned beans. Lean cuts of beef with fat trimmed off. Low-sodium, lean deli meat. Dairy Low-fat (1%) or fat-free (skim) milk. Fat-free, low-fat, or reduced-fat cheeses. Nonfat, low-sodium ricotta or cottage cheese. Low-fat or nonfat yogurt. Low-fat, low-sodium cheese. Fats and oils Soft margarine without trans fats. Vegetable oil. Low-fat, reduced-fat, or light mayonnaise and salad dressings (reduced-sodium). Canola, safflower, olive, soybean, and sunflower oils. Avocado. Seasoning and other foods Herbs. Spices. Seasoning mixes without salt. Unsalted popcorn and pretzels. Fat-free sweets. What foods are not recommended? The items listed  may not be a complete list. Talk with your dietitian about what dietary choices are best for you. Grains Baked goods made with fat, such as croissants, muffins, or some breads. Dry pasta or rice meal packs. Vegetables Creamed or fried vegetables. Vegetables in a cheese sauce. Regular canned vegetables (not low-sodium or reduced-sodium). Regular canned tomato sauce and paste (not low-sodium or reduced-sodium). Regular tomato and vegetable juice (not low-sodium or reduced-sodium). Angie Fava. Olives. Fruits Canned fruit in a light or heavy syrup. Fried fruit. Fruit in cream or butter sauce. Meat and other protein foods Fatty cuts of meat. Ribs. Fried meat. Berniece Salines. Sausage. Bologna and other processed lunch meats. Salami. Fatback. Hotdogs. Bratwurst. Salted nuts and seeds. Canned beans with added salt. Canned or smoked fish. Whole eggs or egg yolks. Chicken or [place] with skin. Dairy Whole or 2% milk, cream, and half-and-half. Whole or full-fat cream cheese. Whole-fat or sweetened yogurt. Full-fat cheese. Nondairy creamers. Whipped toppings. Processed cheese and cheese spreads. Fats and oils Butter. Stick margarine. Lard. Shortening. Ghee. Bacon fat. Tropical oils, such as coconut, palm kernel, or palm oil. Seasoning and other foods Salted popcorn and pretzels. Onion salt, garlic salt, seasoned salt, table salt, and sea salt. Worcestershire sauce. Tartar sauce. Barbecue sauce. Teriyaki sauce. Soy sauce, including reduced-sodium. Steak sauce. Canned and packaged gravies. Fish sauce.  Oyster sauce. Cocktail sauce. Horseradish that you find on the shelf. Ketchup. Mustard. Meat flavorings and tenderizers. Bouillon cubes. Hot sauce and Tabasco sauce. Premade or packaged marinades. Premade or packaged taco seasonings. Relishes. Regular salad dressings. Where to find more information:  National Heart, Lung, and [place]: https://wilson-eaton.com/  American Heart Association: www.heart.org Summary  The DASH  eating plan is a healthy eating plan that has been shown to reduce high blood pressure (hypertension). It may also reduce your risk for type 2 diabetes, heart disease, and stroke.  With the DASH eating plan, you should limit salt (sodium) intake to 2,300 mg a day. If you have hypertension, you may need to reduce your sodium intake to 1,500 mg a day.  When on the DASH eating plan, aim to eat more fresh fruits and vegetables, whole grains, lean proteins, low-fat dairy, and heart-healthy fats.  Work with your health care provider or diet and nutrition specialist (dietitian) to adjust your eating plan to your individual calorie needs. This information is not intended to replace advice given to you by your health care provider. Make sure you discuss any questions you have with your health care provider. Document Released: 12/14/2010 Document Revised: 12/19/2015 Document Reviewed: 12/19/2015 Elsevier Interactive Patient Education  2019 [employer].

## 2018-04-07 ENCOUNTER — Other Ambulatory Visit: Payer: Self-pay | Admitting: Family Medicine

## 2018-04-07 DIAGNOSIS — E782 Mixed hyperlipidemia: Secondary | ICD-10-CM

## 2018-04-07 LAB — COMPLETE METABOLIC PANEL WITH GFR
AG RATIO: 1.8 (calc) (ref 1.0–2.5)
ALKALINE PHOSPHATASE (APISO): 45 U/L (ref 31–125)
ALT: 26 U/L (ref 6–29)
AST: 12 U/L (ref 10–30)
Albumin: 4.3 g/dL (ref 3.6–5.1)
BILIRUBIN TOTAL: 0.4 mg/dL (ref 0.2–1.2)
BUN / CREAT RATIO: 10 (calc) (ref 6–22)
BUN: 12 mg/dL (ref 7–25)
CALCIUM: 9.1 mg/dL (ref 8.6–10.2)
CHLORIDE: 105 mmol/L (ref 98–110)
CO2: 28 mmol/L (ref 20–32)
Creat: 1.21 mg/dL — ABNORMAL HIGH (ref 0.50–1.10)
GFR, EST NON AFRICAN AMERICAN: 57 mL/min/{1.73_m2} — AB (ref >=60)
GFR, Est African American: 66 mL/min/{1.73_m2} (ref >=60)
GLOBULIN: 2.4 g/dL (ref 1.9–3.7)
Glucose, Bld: 83 mg/dL (ref 65–99)
POTASSIUM: 4.2 mmol/L (ref 3.5–5.3)
SODIUM: 140 mmol/L (ref 135–146)
Total Protein: 6.7 g/dL (ref 6.1–8.1)

## 2018-04-07 LAB — CBC WITH DIFFERENTIAL/PLATELET
Absolute Monocytes: 501 cells/uL (ref 200–950)
Basophils Absolute: 69 cells/uL (ref 0–200)
Basophils Relative: 0.9 %
Eosinophils Absolute: 385 cells/uL (ref 15–500)
Eosinophils Relative: 5 %
HEMATOCRIT: 41.7 % (ref 35.0–45.0)
Hemoglobin: 14.1 g/dL (ref 11.7–15.5)
LYMPHS ABS: 2680 {cells}/uL (ref 850–3900)
MCH: 30.1 pg (ref 27.0–33.0)
MCHC: 33.8 g/dL (ref 32.0–36.0)
MCV: 88.9 fL (ref 80.0–100.0)
MONOS PCT: 6.5 %
MPV: 9.3 fL (ref 7.5–12.5)
NEUTROS ABS: 4066 {cells}/uL (ref 1500–7800)
NEUTROS PCT: 52.8 %
Platelets: 258 10*3/uL (ref 140–400)
RBC: 4.69 10*6/uL (ref 3.80–5.10)
RDW: 13.4 % (ref 11.0–15.0)
Total Lymphocyte: 34.8 %
WBC: 7.7 10*3/uL (ref 3.8–10.8)

## 2018-04-07 LAB — HIV ANTIBODY (ROUTINE TESTING W REFLEX): HIV: NONREACTIVE

## 2018-04-07 LAB — HEMOGLOBIN A1C
HEMOGLOBIN A1C: 5.4 %{Hb} (ref <5.7)
MEAN PLASMA GLUCOSE: 108 (calc)
eAG (mmol/L): 6 (calc)

## 2018-04-07 LAB — TSH: TSH: 2.59 m[IU]/L

## 2018-04-07 LAB — CERVICOVAGINAL ANCILLARY ONLY
Chlamydia: NEGATIVE
Neisseria Gonorrhea: NEGATIVE
Trichomonas: NEGATIVE

## 2018-04-07 LAB — HEPATITIS C ANTIBODY
HEP C AB: NONREACTIVE
SIGNAL TO CUT-OFF: 0.01 (ref <1.00)

## 2018-04-07 LAB — RPR: RPR Ser Ql: NONREACTIVE

## 2018-04-07 LAB — LIPID PANEL
Cholesterol: 238 mg/dL — ABNORMAL HIGH (ref <200)
HDL: 37 mg/dL — ABNORMAL LOW (ref >=50)
LDL Cholesterol (Calc): 171 mg/dL (calc) — ABNORMAL HIGH
NON-HDL CHOLESTEROL (CALC): 201 mg/dL — AB (ref <130)
Total CHOL/HDL Ratio: 6.4 (calc) — ABNORMAL HIGH (ref <5.0)
Triglycerides: 154 mg/dL — ABNORMAL HIGH (ref <150)

## 2018-04-07 LAB — MICROALBUMIN / CREATININE URINE RATIO
CREATININE, URINE: 182 mg/dL (ref 20–275)
MICROALB UR: 3.3 mg/dL
MICROALB/CREAT RATIO: 18 ug/mg{creat} (ref <30)

## 2018-04-07 MED ORDER — ATORVASTATIN CALCIUM 20 MG PO TABS: 20.0000 mg | ORAL_TABLET | Freq: Every day | ORAL | 3 refills | Status: DC

## 2018-04-08 ENCOUNTER — Ambulatory Visit: Payer: Self-pay | Admitting: Physician Assistant

## 2018-04-08 ENCOUNTER — Encounter

## 2018-04-24 ENCOUNTER — Other Ambulatory Visit: Payer: Self-pay | Admitting: Family Medicine

## 2018-04-30 ENCOUNTER — Telehealth: Payer: Self-pay | Admitting: Family Medicine

## 2018-04-30 ENCOUNTER — Other Ambulatory Visit: Payer: Self-pay

## 2018-04-30 ENCOUNTER — Ambulatory Visit: Payer: Self-pay

## 2018-04-30 ENCOUNTER — Ambulatory Visit (INDEPENDENT_AMBULATORY_CARE_PROVIDER_SITE_OTHER): Payer: Self-pay | Admitting: Nurse Practitioner

## 2018-04-30 ENCOUNTER — Encounter: Payer: Self-pay | Admitting: Nurse Practitioner

## 2018-04-30 VITALS — Temp 96.5°F | Resp 16 | Ht 64.5 in | Wt 313.4 lb

## 2018-04-30 DIAGNOSIS — J4530 Mild persistent asthma, uncomplicated: Secondary | ICD-10-CM | POA: Insufficient documentation

## 2018-04-30 DIAGNOSIS — R062 Wheezing: Secondary | ICD-10-CM

## 2018-04-30 DIAGNOSIS — F331 Major depressive disorder, recurrent, moderate: Secondary | ICD-10-CM

## 2018-04-30 DIAGNOSIS — R05 Cough: Secondary | ICD-10-CM

## 2018-04-30 DIAGNOSIS — J45909 Unspecified asthma, uncomplicated: Secondary | ICD-10-CM | POA: Insufficient documentation

## 2018-04-30 DIAGNOSIS — J4520 Mild intermittent asthma, uncomplicated: Secondary | ICD-10-CM

## 2018-04-30 DIAGNOSIS — R059 Cough, unspecified: Secondary | ICD-10-CM

## 2018-04-30 DIAGNOSIS — J40 Bronchitis, not specified as acute or chronic: Secondary | ICD-10-CM

## 2018-04-30 DIAGNOSIS — Z7189 Other specified counseling: Secondary | ICD-10-CM

## 2018-04-30 MED ORDER — BENZONATATE 100 MG PO CAPS: 200.0000 mg | ORAL_CAPSULE | Freq: Three times a day (TID) | ORAL | 0 refills | Status: DC | PRN

## 2018-04-30 MED ORDER — GUAIFENESIN ER 600 MG PO TB12: 600.0000 mg | ORAL_TABLET | Freq: Two times a day (BID) | ORAL | 0 refills | Status: DC

## 2018-04-30 MED ORDER — AZITHROMYCIN 250 MG PO TABS: ORAL_TABLET | ORAL | 0 refills | Status: DC

## 2018-04-30 MED ORDER — ALBUTEROL SULFATE HFA 108 (90 BASE) MCG/ACT IN AERS: 2.0000 | INHALATION_SPRAY | Freq: Four times a day (QID) | RESPIRATORY_TRACT | 2 refills | Status: DC | PRN

## 2018-04-30 NOTE — Telephone Encounter (Signed)
Note has been printed and placed upfront for pick-up.

## 2018-04-30 NOTE — Progress Notes (Signed)
Virtual Visit via Video Note  I connected with Barbara Pennington on 04/30/18 at 11:40 AM EDT by a video enabled telemedicine application and verified that I am speaking with the correct person using two identifiers.   Staff discussed the limitations of evaluation and management by telemedicine and the availability of in person appointments. The patient expressed understanding and agreed to proceed.  Patient location: home  My location: home office Other people present:  none HPI  Patient endorses cough for about 2 weeks, chest tightness and wheezing, lots of chest congestions but unable to cough it up, states has humidifier and been using it. Using albuterol temporarily relieves wheezing. Currently having dry hacking cough, worse at night- help if she is propped up. Taking an OTC cold and flu medications . Endorses mild exertional shortness of breath, nasal congestion.  Denies fevers, facial pain, sore throat, ear pain, watery/itchy eyes.   States would normally not be concerned but due to corona virus is concerned.  States when to [place] in march and allergies from dogs were trigger and had some mild wheezing then.   PHQ2/9: Depression screen [facility] 2/9 04/30/2018 04/04/2018  Decreased Interest 0 0  Down, Depressed, Hopeless 0 0  PHQ - 2 Score 0 0  Altered sleeping 2 3  Tired, decreased energy 2 3  Change in appetite 2 3  Feeling bad or failure about yourself  0 1  Trouble concentrating 1 1  Moving slowly or fidgety/restless 0 0  Suicidal thoughts 0 0  PHQ-9 Score 7 11  Difficult doing work/chores Somewhat difficult Somewhat difficult     PHQ reviewed. Positive- improved since last time, medications is working well for her. Sees provider at [facility], weekly therapy.   Patient Active Problem List   Diagnosis Date Noted  . Low back pain 08/20/2017  . 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 Mastitis, left, acute 12/20/2016  . Hypertension     Past Medical History:  Diagnosis Date   . Anxiety   . Arthritis 2006   rhuematoid - mild - no current issues  . Asthma 2003  . Complication of anesthesia    ran fever after c-section - had infection.  "Usually" has low temp after surgery.  . Hypertension 2013  . Lump or mass in breast 2013   RIGHT BREAST  . Motion sickness    repeated amusement park rides  . Scoliosis    no current issues  . Shortness of breath dyspnea    secondary to weight   . Ulcer 2001    Past Surgical History:  Procedure Laterality Date  . ABDOMINAL HYSTERECTOMY  2008  . BREAST BIOPSY Left 2013   CORE - FIBROADENOMA VS PHYLLODES  . CESAREAN SECTION  2007  . COLONOSCOPY WITH PROPOFOL N/A 10/09/2016   Procedure: COLONOSCOPY WITH PROPOFOL;  Surgeon: Jonathon Bellows, MD;  Location: [facility] ENDOSCOPY;  Service: Gastroenterology;  Laterality: N/A;  . GUM SURGERY  2004  . PLANTAR FASCIA RELEASE Left 08/19/2014   Procedure: ENDOSCOPIC PLANTAR FASCIOTOMY RELEASE L WITH TOPAZ;  Surgeon: Albertine Patricia, DPM;  Location: [place];  Service: Podiatry;  Laterality: Left;  LMA WITH POPLITEAL BLOCK  . TONSILLECTOMY  1984    Social History   Tobacco Use  . Smoking status: Former Smoker    Packs/day: 0.25    Years: 10.00    Pack years: 2.50    Types: Cigarettes    Last attempt to quit: 07/05/2014    Years since quitting: 3.8  . Smokeless  tobacco: Never Used  Substance Use Topics  . Alcohol use: Yes    Comment: rare     Current Outpatient Medications:  .  acetaminophen (TYLENOL) 325 MG tablet, Take 650 mg by mouth every 6 (six) hours as needed. , Disp: , Rfl:  .  albuterol (PROVENTIL HFA;VENTOLIN HFA) 108 (90 Base) MCG/ACT inhaler, Inhale 2 puffs into the lungs every 6 (six) hours as needed for wheezing or shortness of breath., Disp: 1 Inhaler, Rfl: 2 .  atorvastatin (LIPITOR) 20 MG tablet, Take 1 tablet (20 mg total) by mouth daily., Disp: 90 tablet, Rfl: 3 .  buPROPion (WELLBUTRIN SR) 150 MG 12 hr tablet, Take 150 mg by mouth 2 (two) times  daily., Disp: , Rfl:  .  BUPROPION HBR ER PO, Take by mouth., Disp: , Rfl:  .  diclofenac (VOLTAREN) 75 MG EC tablet, Take 75 mg by mouth 2 (two) times daily., Disp: , Rfl:  .  escitalopram (LEXAPRO) 20 MG tablet, Take 20 mg by mouth daily. , Disp: , Rfl:  .  hydrochlorothiazide (HYDRODIURIL) 25 MG tablet, TAKE 1 TABLET BY MOUTH DAILY, Disp: 90 tablet, Rfl: 0 .  hydrOXYzine (ATARAX/VISTARIL) 25 MG tablet, Take 50 mg by mouth 2 (two) times daily., Disp: , Rfl:   Allergies  Allergen Reactions  . Latex Rash    ROS    No other specific complaints in a complete review of systems (except as listed in HPI above).  Objective  Vitals:   04/30/18 1105  Temp: (!) 96.5 F (35.8 C)  TempSrc: Oral  Weight: (!) 313 lb 6.4 oz (142.2 kg)  Height: 5' 4.5" (1.638 m)     Body mass index is 52.96 kg/m.  Nursing Note and Vital Signs reviewed.  Physical Exam  Constitutional: Patient appears well-developed and well-nourished. No distress.  HENT: Head: Normocephalic and atraumatic. Pulmonary/Chest: Effort normal  Musculoskeletal: Normal range of motion,  Neurological: he is alert and oriented to person, place, and time. speech and gait are normal.  Skin: No rash noted. No erythema.  Psychiatric: Patient has a normal mood and affect. behavior is normal. Judgment and thought content normal.    Assessment & Plan  1. Cough Likely bronchitis as she has had this before; however in abundance of caution will utilize suspected COVID guidelines discussed with patient and work note provided.  - benzonatate (TESSALON PERLES) 100 MG capsule; Take 2 capsules (200 mg total) by mouth 3 (three) times daily as needed for cough.  Dispense: 30 capsule; Refill: 0 - guaiFENesin (MUCINEX) 600 MG 12 hr tablet; Take 1 tablet (600 mg total) by mouth 2 (two) times daily.  Dispense: 30 tablet; Refill: 0  2. Mild intermittent asthma without complication - albuterol (VENTOLIN HFA) 108 (90 Base) MCG/ACT inhaler;  Inhale 2 puffs into the lungs every 6 (six) hours as needed for wheezing or shortness of breath.  Dispense: 1 Inhaler; Refill: 2  3. Wheezing - albuterol (VENTOLIN HFA) 108 (90 Base) MCG/ACT inhaler; Inhale 2 puffs into the lungs every 6 (six) hours as needed for wheezing or shortness of breath.  Dispense: 1 Inhaler; Refill: 2  4. Bronchitis - benzonatate (TESSALON PERLES) 100 MG capsule; Take 2 capsules (200 mg total) by mouth 3 (three) times daily as needed for cough.  Dispense: 30 capsule; Refill: 0 - guaiFENesin (MUCINEX) 600 MG 12 hr tablet; Take 1 tablet (600 mg total) by mouth 2 (two) times daily.  Dispense: 30 tablet; Refill: 0 - azithromycin (ZITHROMAX) 250 MG tablet; Take 2  pills day one and one pill for th next 4 days  Dispense: 6 tablet; Refill: 0  5. Moderate episode of recurrent major depressive disorder ([place]) Improving continue follow-up with [facility], plans to start weekly therapy    Follow Up Instructions:   Follow up on Monday to ensure appropriate to return to work, sooner if needed.  I discussed the assessment and treatment plan with the patient. The patient was provided an opportunity to ask questions and all were answered. The patient agreed with the plan and demonstrated an understanding of the instructions.   The patient was advised to call back or seek an in-person evaluation if the symptoms worsen or if the condition fails to improve as anticipated.  I provided 16 minutes of non-face-to-face time during this encounter.   Fredderick Severance, NP

## 2018-04-30 NOTE — Telephone Encounter (Signed)
Copied from [place] [phone]. Topic: General - Other >> Apr 30, 2018  1:15 PM Lennox Solders wrote: Reason for CRM: pt will like to come to office tomorrow to pick up a copy of work note. Pt is aware to come by between 8-12 noon. Pt had virtual appt with elizabeth today

## 2018-04-30 NOTE — Telephone Encounter (Signed)
Patient called and says she's been having a cough, SOB with chest tightness for the past 2 weeks. She says she's been using her inhaler more for over the past 2 weeks and she has wheezing as well. She says she checks her temperature and it's normal reading, denies chills, body aches. She denies travel and exposure to anyone with COVID-19. She says she feels the congestion in her chest, but nothing is coming up. She also says she has nasal congestion. She says she has the capability for a web ex visit. I called the office and spoke to [place], [facility] who asked to speak to the patient, the call was connected successfully.  Answer Assessment - Initial Assessment Questions 1. ONSET: "When did the cough begin?"      Past 2 weeks 2. SEVERITY: "How bad is the cough today?"     Varies depending on what I'm doing 3. RESPIRATORY DISTRESS: "Describe your breathing."      Wheezing, shortness of breath at rest 4. FEVER: "Do you have a fever?" If so, ask: "What is your temperature, how was it measured, and when did it start?"     No 5. HEMOPTYSIS: "Are you coughing up any blood?" If so ask: "How much?" (flecks, streaks, tablespoons, etc.)     No 6. TREATMENT: "What have you done so far to treat the cough?" (e.g., meds, fluids, humidifier)     Inhaler, fluids, steam from shower 7. CARDIAC HISTORY: "Do you have any history of heart disease?" (e.g., heart attack, congestive heart failure)      No 8. LUNG HISTORY: "Do you have any history of lung disease?"  (e.g., pulmonary embolus, asthma, emphysema)     Asthma 9. PE RISK FACTORS: "Do you have a history of blood clots?" (or: recent major surgery, recent prolonged travel, bedridden)     No 10. OTHER SYMPTOMS: "Do you have any other symptoms? (e.g., runny nose, wheezing, chest pain)     Wheezing, nasal congestion, chest tightness past 2 weeks 11. PREGNANCY: "Is there any chance you are pregnant?" "When was your last menstrual period?"       No 12. TRAVEL: "Have  you traveled out of the country in the last month?" (e.g., travel history, exposures)       No  Protocols used: COUGH - ACUTE NON-PRODUCTIVE-A-AH

## 2018-05-01 ENCOUNTER — Telehealth: Payer: Self-pay

## 2018-05-01 NOTE — Telephone Encounter (Signed)
Called patient for COVID-19 screening. Moved apt up to 05/07/18.  Have you recently traveled any where out of the local area in the last 2 weeks? no  Have you been in close contact with a person diagnosed with COVID-19 within the last 2 weeks? no  Do you currently have any fever, cough, or shortness of breath? Cough, is being treated by PCP for bronchitis. Patient will call us if does not resolve and r/s apt.   Okay to proceed with visit.

## 2018-05-07 ENCOUNTER — Other Ambulatory Visit: Payer: Self-pay

## 2018-05-07 ENCOUNTER — Encounter: Payer: Self-pay | Admitting: Internal Medicine

## 2018-05-07 ENCOUNTER — Telehealth: Payer: Self-pay

## 2018-05-07 ENCOUNTER — Ambulatory Visit (INDEPENDENT_AMBULATORY_CARE_PROVIDER_SITE_OTHER): Payer: Self-pay | Admitting: Internal Medicine

## 2018-05-07 VITALS — BP 132/74 | HR 84 | Ht 64.5 in | Wt 312.8 lb

## 2018-05-07 DIAGNOSIS — G4719 Other hypersomnia: Secondary | ICD-10-CM

## 2018-05-07 DIAGNOSIS — J4540 Moderate persistent asthma, uncomplicated: Secondary | ICD-10-CM

## 2018-05-07 MED ORDER — FLUTICASONE FUROATE-VILANTEROL 100-25 MCG/INH IN AEPB: 1.0000 | INHALATION_SPRAY | Freq: Every day | RESPIRATORY_TRACT | 0 refills | Status: AC

## 2018-05-07 NOTE — Patient Instructions (Addendum)
Continue albuterol inhaler as needed. Start Flonase nasal spray 2 sprays in each nostril per day. We will be getting in touch with you to set up a home sleep test, this will be done through an online company, not through our office.  If we cannot set you up with an online company then we will try to arrange a home sleep test through our office.  Please make sure that you are set up to use the patient portal (My Chart) so that we can message you information about setting up the sleep study.   Will give you a sample of Breo inhaler to be used once daily, if helpful let us know and we can give you a prescription.

## 2018-05-07 NOTE — Telephone Encounter (Signed)
Paperwork filled out and faxed to [employer] for sleep study. [phone]). Confirmation received.

## 2018-05-07 NOTE — Progress Notes (Signed)
[facility] Consultation      Assessment and Plan:  Excessive daytime sleepiness. - Symptoms and signs of obstructive sleep apnea, patient also has a history of TMJ. - We will set up for home sleep test.  Patient does not have insurance, will so I will see if I can set her up with a online company that can perform her home sleep test for a lower cost.  If this is positive we will set her up for CPAP via [facility].  Asthma with dyspnea. - Dyspnea is likely due to multifactorial, including asthma and obesity. - Continue albuterol inhaler as needed. Will give trial of breo today.   Obesity. - Morbid obesity. - Weight loss is recommended.  GERD. - Occasional symptoms, controlled with diet. - If not controlled at next visit, or if asthma symptoms persist may consider empiric treatment with PPI.   Date: 05/07/2018  MRN# [id number] Barbara Pennington 02-27-1980  Referring Physician: Raelyn Ensign, NP.   Barbara Pennington is a 38 y.o. old female seen in consultation for chief complaint of:    Chief Complaint  Patient presents with  . Consult    referred by Isabel Caprice for sleep consult, patient always timred, doesn't sleep well at night, wakes up gasping, sometimes headaches, have been going on for last few years   . Wheezing    has been using albuterol consistantly   . Shortness of Breath    pretty much all the time    HPI:   She has noticed that she has been increasingly sleepy, and trouble falling asleep, waking several times per night. Both of her parents have OSA, she has woken herself up gasping for air.  Denies cataplexy, sleep walking, sleep paralysis. No dentures, denies jaw pain, has been told that she has TMJ.   She also notes some dyspnea with sitting, mild exertion. She was diagnosed with asthma about 17 years ago. She stopped smoking about 4 years ago, her breathing improved and she no longer needed an inhaler. But she has gained about 75 pounds in the past 2  years, she restarted her albuterol inhaler about twice per day and it helps.  She has taken advair in the past.  No pets at home, does have reflux, not on medications, symptoms controlled with diet but wakes up about once per month with reflux.     PMHX:   Past Medical History:  Diagnosis Date  . Anxiety   . Arthritis 2006   rhuematoid - mild - no current issues  . Asthma 2003  . Complication of anesthesia    ran fever after c-section - had infection.  "Usually" has low temp after surgery.  . Hypertension 2013  . Lump or mass in breast 2013   RIGHT BREAST  . Motion sickness    repeated amusement park rides  . Scoliosis    no current issues  . Shortness of breath dyspnea    secondary to weight   . Ulcer 2001   Surgical Hx:  Past Surgical History:  Procedure Laterality Date  . ABDOMINAL HYSTERECTOMY  2008  . BREAST BIOPSY Left 2013   CORE - FIBROADENOMA VS PHYLLODES  . CESAREAN SECTION  2007  . COLONOSCOPY WITH PROPOFOL N/A 10/09/2016   Procedure: COLONOSCOPY WITH PROPOFOL;  Surgeon: Jonathon Bellows, MD;  Location: [facility] ENDOSCOPY;  Service: Gastroenterology;  Laterality: N/A;  . GUM SURGERY  2004  . PLANTAR FASCIA RELEASE Left 08/19/2014   Procedure: ENDOSCOPIC PLANTAR FASCIOTOMY  RELEASE L WITH TOPAZ;  Surgeon: Albertine Patricia, DPM;  Location: [place];  Service: Podiatry;  Laterality: Left;  LMA WITH POPLITEAL BLOCK  . TONSILLECTOMY  1984   Family Hx:  Family History  Problem Relation Age of Onset  . Testicular cancer Brother   . Colon cancer Paternal Grandfather   . Breast cancer Maternal Grandmother   . Hypertension Maternal Grandmother   . Aneurysm Maternal Grandmother   . Stroke Maternal Grandmother   . Obesity Mother   . Diabetes Mother   . High Cholesterol Mother   . Hypertension Mother   . Neuropathy Mother   . Depression Mother   . Anxiety disorder Mother   . Obesity Father   . Hypertension Father   . High Cholesterol Father   . Depression Father    . Anxiety disorder Father   . Hypertension Maternal Grandfather   . High Cholesterol Maternal Grandfather   . Heart disease Maternal Grandfather        has a pacemaker  . Stroke Maternal Grandfather   . Alzheimer's disease Paternal Grandmother   . Dementia Paternal Grandmother    Social Hx:   Social History   Tobacco Use  . Smoking status: Former Smoker    Packs/day: 0.25    Years: 10.00    Pack years: 2.50    Types: Cigarettes    Last attempt to quit: 07/05/2014    Years since quitting: 3.8  . Smokeless tobacco: Never Used  Substance Use Topics  . Alcohol use: Yes    Comment: rare  . Drug use: No   Medication:    Current Outpatient Medications:  .  acetaminophen (TYLENOL) 325 MG tablet, Take 650 mg by mouth every 6 (six) hours as needed. , Disp: , Rfl:  .  albuterol (VENTOLIN HFA) 108 (90 Base) MCG/ACT inhaler, Inhale 2 puffs into the lungs every 6 (six) hours as needed for wheezing or shortness of breath., Disp: 1 Inhaler, Rfl: 2 .  atorvastatin (LIPITOR) 20 MG tablet, Take 1 tablet (20 mg total) by mouth daily., Disp: 90 tablet, Rfl: 3 .  azithromycin (ZITHROMAX) 250 MG tablet, Take 2 pills day one and one pill for th next 4 days, Disp: 6 tablet, Rfl: 0 .  benzonatate (TESSALON PERLES) 100 MG capsule, Take 2 capsules (200 mg total) by mouth 3 (three) times daily as needed for cough., Disp: 30 capsule, Rfl: 0 .  buPROPion (WELLBUTRIN SR) 150 MG 12 hr tablet, Take 150 mg by mouth 2 (two) times daily., Disp: , Rfl:  .  BUPROPION HBR ER PO, Take by mouth., Disp: , Rfl:  .  diclofenac (VOLTAREN) 75 MG EC tablet, Take 75 mg by mouth 2 (two) times daily., Disp: , Rfl:  .  escitalopram (LEXAPRO) 20 MG tablet, Take 20 mg by mouth daily. , Disp: , Rfl:  .  guaiFENesin (MUCINEX) 600 MG 12 hr tablet, Take 1 tablet (600 mg total) by mouth 2 (two) times daily., Disp: 30 tablet, Rfl: 0 .  hydrochlorothiazide (HYDRODIURIL) 25 MG tablet, TAKE 1 TABLET BY MOUTH DAILY, Disp: 90 tablet, Rfl:  0 .  hydrOXYzine (ATARAX/VISTARIL) 25 MG tablet, Take 50 mg by mouth 2 (two) times daily., Disp: , Rfl:    Allergies:  Latex  Review of Systems: Gen:  Denies  fever, sweats, chills HEENT: Denies blurred vision, double vision. bleeds, sore throat Cvc:  No dizziness, chest pain. Resp:   Denies cough or sputum production, shortness of breath Gi: Denies swallowing difficulty,  stomach pain. Gu:  Denies bladder incontinence, burning urine Ext:   No Joint pain, stiffness. Skin: No skin rash,  hives  Endoc:  No polyuria, polydipsia. Psych: No depression, insomnia. Other:  All other systems were reviewed with the patient and were negative other that what is mentioned in the HPI.   Physical Examination:   VS: BP 132/74 (BP Location: Left Arm, Cuff Size: Normal)   Pulse 84   Ht 5' 4.5" (1.638 m)   Wt (!) 312 lb 12.8 oz (141.9 kg)   SpO2 97%   BMI 52.86 kg/m   General Appearance: No distress  Neuro:without focal findings,  speech normal,  HEENT: PERRLA, EOM intact.  Mallampati 3. Pulmonary: normal breath sounds, No wheezing.  CardiovascularNormal S1,S2.  No m/r/g.   Abdomen: Benign, Soft, non-tender. Renal:  No costovertebral tenderness  GU:  No performed at this time. Endoc: No evident thyromegaly, no signs of acromegaly. Skin:   warm, no rashes, no ecchymosis  Extremities: normal, no cyanosis, clubbing.  Other findings:    LABORATORY PANEL:   CBC No results for input(s): WBC, HGB, HCT, PLT in the last 168 hours. ------------------------------------------------------------------------------------------------------------------  Chemistries  No results for input(s): NA, K, CL, CO2, GLUCOSE, BUN, CREATININE, CALCIUM, MG, AST, ALT, ALKPHOS, BILITOT in the last 168 hours.  Invalid input(s): GFRCGP ------------------------------------------------------------------------------------------------------------------  Cardiac Enzymes No results for input(s): TROPONINI in the last  168 hours. ------------------------------------------------------------  RADIOLOGY:  No results found.     Thank  you for the consultation and for allowing [name] to assist in the care of your patient. Our recommendations are noted above.  Please contact us if we can be of further service.   Marda Stalker, M.D., F.C.C.P.  Board Certified in Internal Medicine, Pulmonary Medicine, [place], and Sleep Medicine.  [facility] Office Number: [phone]   05/07/2018

## 2018-05-14 NOTE — Telephone Encounter (Signed)
Spoke with Andee Poles at [employer]. The sleep study machine has gone out to the patient and should be there for Friday. Once the results are received, they will make patient aware as well as our office. Nothing else needed at this time.

## 2018-06-24 ENCOUNTER — Encounter: Payer: Self-pay | Admitting: Family Medicine

## 2018-07-09 ENCOUNTER — Institutional Professional Consult (permissible substitution): Payer: Self-pay | Admitting: Internal Medicine

## 2018-08-06 ENCOUNTER — Ambulatory Visit: Payer: Self-pay | Admitting: Internal Medicine

## 2018-08-06 ENCOUNTER — Encounter: Payer: Self-pay | Admitting: Family Medicine

## 2018-09-04 ENCOUNTER — Ambulatory Visit (INDEPENDENT_AMBULATORY_CARE_PROVIDER_SITE_OTHER): Payer: Self-pay | Admitting: Family Medicine

## 2018-09-04 ENCOUNTER — Other Ambulatory Visit: Payer: Self-pay

## 2018-09-04 ENCOUNTER — Encounter: Payer: Self-pay | Admitting: Family Medicine

## 2018-09-04 VITALS — BP 124/78 | HR 92 | Temp 97.3°F | Resp 16 | Ht 67.0 in | Wt 314.6 lb

## 2018-09-04 DIAGNOSIS — J4520 Mild intermittent asthma, uncomplicated: Secondary | ICD-10-CM

## 2018-09-04 DIAGNOSIS — N3000 Acute cystitis without hematuria: Secondary | ICD-10-CM

## 2018-09-04 LAB — POCT URINALYSIS DIPSTICK
Bilirubin, UA: NEGATIVE
Glucose, UA: NEGATIVE
Ketones, UA: NEGATIVE
Nitrite, UA: NEGATIVE
Protein, UA: POSITIVE — AB
Spec Grav, UA: 1.015 (ref 1.010–1.025)
Urobilinogen, UA: 0.2 E.U./dL
pH, UA: 5 (ref 5.0–8.0)

## 2018-09-04 MED ORDER — ALBUTEROL SULFATE HFA 108 (90 BASE) MCG/ACT IN AERS: 2.0000 | INHALATION_SPRAY | Freq: Four times a day (QID) | RESPIRATORY_TRACT | 3 refills | Status: DC | PRN

## 2018-09-04 MED ORDER — SULFAMETHOXAZOLE-TRIMETHOPRIM 800-160 MG PO TABS: 1.0000 | ORAL_TABLET | Freq: Two times a day (BID) | ORAL | 0 refills | Status: DC

## 2018-09-04 NOTE — Progress Notes (Signed)
Name: Barbara Pennington   MRN: [id number]    DOB: 11/04/1980   Date:09/04/2018       Progress Note  Subjective  Chief Complaint  Chief Complaint  Patient presents with  . Urinary Tract Infection    frequency, burning    I connected with  Barbara Pennington  on 09/04/18 at  7:20 AM EDT by a video enabled telemedicine application and verified that I am speaking with the correct person using two identifiers.  I discussed the limitations of evaluation and management by telemedicine and the availability of in person appointments. The patient expressed understanding and agreed to proceed. Staff also discussed with the patient that there may be a patient responsible charge related to this service. Patient Location: Home Provider Location: Office Additional Individuals present: None  HPI  Pt presents with concern for possible UTI.  She has history of UTI's and 1 episode of pyelonephritis in the past.  She notes this current episode has been ongoing for 3 days.  She is having some central lower abdominal pain that is intermittent along with some nausea, dysuria; Denies hematuria, flank or back pain, history of nephrolithiasis, fever.  Patient Active Problem List   Diagnosis Date Noted  . Asthma 04/30/2018  . Low back pain 08/20/2017  . 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 Mastitis, left, acute 12/20/2016  . Hypertension     Social History   Tobacco Use  . Smoking status: Former Smoker    Packs/day: 0.25    Years: 10.00    Pack years: 2.50    Types: Cigarettes    Quit date: 07/05/2014    Years since quitting: 4.1  . Smokeless tobacco: Never Used  Substance Use Topics  . Alcohol use: Yes    Comment: rare     Current Outpatient Medications:  .  acetaminophen (TYLENOL) 325 MG tablet, Take 650 mg by mouth every 6 (six) hours as needed. , Disp: , Rfl:  .  albuterol (VENTOLIN HFA) 108 (90 Base) MCG/ACT inhaler, Inhale 2 puffs into the lungs every 6 (six) hours as needed for wheezing  or shortness of breath., Disp: 18 g, Rfl: 3 .  atorvastatin (LIPITOR) 20 MG tablet, Take 1 tablet (20 mg total) by mouth daily., Disp: 90 tablet, Rfl: 3 .  buPROPion (WELLBUTRIN SR) 150 MG 12 hr tablet, Take 150 mg by mouth 2 (two) times daily., Disp: , Rfl:  .  escitalopram (LEXAPRO) 20 MG tablet, Take 20 mg by mouth daily. , Disp: , Rfl:  .  hydrochlorothiazide (HYDRODIURIL) 25 MG tablet, TAKE 1 TABLET BY MOUTH DAILY, Disp: 90 tablet, Rfl: 0 .  BUPROPION HBR ER PO, Take by mouth., Disp: , Rfl:  .  diclofenac (VOLTAREN) 75 MG EC tablet, Take 75 mg by mouth 2 (two) times daily., Disp: , Rfl:  .  hydrOXYzine (ATARAX/VISTARIL) 25 MG tablet, Take 50 mg by mouth 2 (two) times daily., Disp: , Rfl:  .  sulfamethoxazole-trimethoprim (BACTRIM DS) 800-160 MG tablet, Take 1 tablet by mouth 2 (two) times daily for 5 days., Disp: 10 tablet, Rfl: 0  Allergies  Allergen Reactions  . Latex Rash    I personally reviewed active problem list, medication list, allergies, health maintenance, notes from last encounter, lab results with the patient/caregiver today.  ROS  Ten systems reviewed and is negative except as mentioned in HPI  Objective  Virtual encounter, vitals not obtained.  Body mass index is 49.27 kg/m.  Nursing Note and Vital Signs reviewed.  Physical Exam  Constitutional: Patient appears well-developed and well-nourished. No distress.  HENT: Head: Normocephalic and atraumatic.  Neck: Normal range of motion. Pulmonary/Chest: Effort normal. No respiratory distress. Speaking in complete sentences Neurological: Pt is alert and oriented to person, place, and time. Coordination, speech are normal.  Psychiatric: Patient has a normal mood and affect. behavior is normal. Judgment and thought content normal.  Results for orders placed or performed in visit on 09/04/18 (from the past 72 hour(s))  POCT urinalysis dipstick     Status: Abnormal   Collection Time: 09/04/18 10:10 AM  Result Value  Ref Range   Color, UA yellow    Clarity, UA cloudy    Glucose, UA Negative Negative   Bilirubin, UA negative    Ketones, UA negative    Spec Grav, UA 1.015 1.010 - 1.025   Blood, UA trace    pH, UA 5.0 5.0 - 8.0   Protein, UA Positive (A) Negative   Urobilinogen, UA 0.2 0.2 or 1.0 E.U./dL   Nitrite, UA negative    Leukocytes, UA Large (3+) (A) Negative   Appearance cloudy    Odor none     Assessment & Plan  1. Acute cystitis without hematuria - sulfamethoxazole-trimethoprim (BACTRIM DS) 800-160 MG tablet; Take 1 tablet by mouth 2 (two) times daily for 5 days.  Dispense: 10 tablet; Refill: 0 - POCT urinalysis dipstick - Urine Culture  2. Mild intermittent asthma without complication - albuterol (VENTOLIN HFA) 108 (90 Base) MCG/ACT inhaler; Inhale 2 puffs into the lungs every 6 (six) hours as needed for wheezing or shortness of breath.  Dispense: 18 g; Refill: 3   -Red flags and when to present for emergency care or RTC including fever >101.31F, chest pain, shortness of breath, new/worsening/un-resolving symptoms, flank/back/worsening abdominal pain, frank hematuria reviewed with patient at time of visit. Follow up and care instructions discussed and provided in AVS. - I discussed the assessment and treatment plan with the patient. The patient was provided an opportunity to ask questions and all were answered. The patient agreed with the plan and demonstrated an understanding of the instructions.  I provided 13 minutes of non-face-to-face time during this encounter.  Hubbard Hartshorn, FNP

## 2018-09-06 LAB — URINE CULTURE
MICRO NUMBER:: 819965
SPECIMEN QUALITY:: ADEQUATE

## 2018-09-08 ENCOUNTER — Encounter: Payer: Self-pay | Admitting: Family Medicine

## 2018-09-08 DIAGNOSIS — N3000 Acute cystitis without hematuria: Secondary | ICD-10-CM

## 2018-09-08 MED ORDER — SULFAMETHOXAZOLE-TRIMETHOPRIM 800-160 MG PO TABS: 1.0000 | ORAL_TABLET | Freq: Two times a day (BID) | ORAL | 0 refills | Status: AC

## 2018-09-09 ENCOUNTER — Encounter: Payer: Self-pay | Admitting: Family Medicine

## 2018-09-09 ENCOUNTER — Other Ambulatory Visit
Admission: RE | Admit: 2018-09-09 | Discharge: 2018-09-09 | Disposition: A | Discharge status: Home / Self Care | Payer: Self-pay | Source: Ambulatory Visit | Attending: Nurse Practitioner | Admitting: Nurse Practitioner

## 2018-09-09 ENCOUNTER — Other Ambulatory Visit: Payer: Self-pay | Admitting: Nurse Practitioner

## 2018-09-09 ENCOUNTER — Ambulatory Visit (INDEPENDENT_AMBULATORY_CARE_PROVIDER_SITE_OTHER): Payer: Self-pay | Admitting: Nurse Practitioner

## 2018-09-09 VITALS — Temp 98.3°F | Resp 18

## 2018-09-09 DIAGNOSIS — N390 Urinary tract infection, site not specified: Secondary | ICD-10-CM | POA: Insufficient documentation

## 2018-09-09 LAB — CBC WITH DIFFERENTIAL/PLATELET
Abs Immature Granulocytes: 0.03 10*3/uL (ref 0.00–0.07)
Basophils Absolute: 0.1 10*3/uL (ref 0.0–0.1)
Basophils Relative: 1 %
Eosinophils Absolute: 0.4 10*3/uL (ref 0.0–0.5)
Eosinophils Relative: 5 %
HCT: 41.7 % (ref 36.0–46.0)
Hemoglobin: 13.6 g/dL (ref 12.0–15.0)
Immature Granulocytes: 0 %
Lymphocytes Relative: 38 %
Lymphs Abs: 3.2 10*3/uL (ref 0.7–4.0)
MCH: 29.4 pg (ref 26.0–34.0)
MCHC: 32.6 g/dL (ref 30.0–36.0)
MCV: 90.3 fL (ref 80.0–100.0)
Monocytes Absolute: 0.7 10*3/uL (ref 0.1–1.0)
Monocytes Relative: 8 %
Neutro Abs: 4 10*3/uL (ref 1.7–7.7)
Neutrophils Relative %: 48 %
Platelets: 244 10*3/uL (ref 150–400)
RBC: 4.62 MIL/uL (ref 3.87–5.11)
RDW: 13.6 % (ref 11.5–15.5)
WBC: 8.3 10*3/uL (ref 4.0–10.5)
nRBC: 0 % (ref 0.0–0.2)

## 2018-09-09 LAB — BASIC METABOLIC PANEL
Anion gap: 10 (ref 5–15)
BUN: 13 mg/dL (ref 6–20)
CO2: 24 mmol/L (ref 22–32)
Calcium: 9.3 mg/dL (ref 8.9–10.3)
Chloride: 106 mmol/L (ref 98–111)
Creatinine, Ser: 1.24 mg/dL — ABNORMAL HIGH (ref 0.44–1.00)
GFR calc Af Amer: >60 mL/min (ref >60)
GFR calc non Af Amer: 55 mL/min — ABNORMAL LOW (ref >60)
Glucose, Bld: 92 mg/dL (ref 70–99)
Potassium: 4 mmol/L (ref 3.5–5.1)
Sodium: 140 mmol/L (ref 135–145)

## 2018-09-09 MED ORDER — TIZANIDINE HCL 2 MG PO CAPS: 2.0000 mg | ORAL_CAPSULE | Freq: Every evening | ORAL | 0 refills | Status: DC | PRN

## 2018-09-09 NOTE — Progress Notes (Signed)
Virtual Visit via Video Note  I connected with Barbara Pennington  on 09/09/18 at  2:20 PM EDT by a video enabled telemedicine application and verified that I am speaking with the correct person using two identifiers.   Staff discussed the limitations of evaluation and management by telemedicine and the availability of in person appointments. The patient expressed understanding and agreed to proceed.  Patient location: work   My location: work office Other people present: none HPI  Patient was seen virtually on 09/04/2018 by Raquel Sarna NP for UTI with 7 day course of bactrim, urine culture showed E.Coli that was sensitive to bactrim, <20 MIC. Patient endorses back pain and burning with urination, urinary frequency, endorses nausea and fatigue. Patient states she has had pyelonephritis in the past and states this feel similar. Denies blood in urine. States lower back pain is bilateral- 5-6/10. States this morning it was minimal but has been increasing.  Has tried tylenol and ibuprofen with minimal relief.   Denies fevers, chills,   Patient Active Problem List   Diagnosis Date Noted  . Asthma 04/30/2018  . Low back pain 08/20/2017  . 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 Mastitis, left, acute 12/20/2016  . Hypertension     Past Medical History:  Diagnosis Date  . Anxiety   . Arthritis 2006   rhuematoid - mild - no current issues  . Asthma 2003  . Complication of anesthesia    ran fever after c-section - had infection.  "Usually" has low temp after surgery.  . Hypertension 2013  . Lump or mass in breast 2013   RIGHT BREAST  . Motion sickness    repeated amusement park rides  . Scoliosis    no current issues  . Shortness of breath dyspnea    secondary to weight   . Ulcer 2001    Past Surgical History:  Procedure Laterality Date  . ABDOMINAL HYSTERECTOMY  2008  . BREAST BIOPSY Left 2013   CORE - FIBROADENOMA VS PHYLLODES  . CESAREAN SECTION  2007  . COLONOSCOPY WITH PROPOFOL N/A  10/09/2016   Procedure: COLONOSCOPY WITH PROPOFOL;  Surgeon: Jonathon Bellows, MD;  Location: [facility] ENDOSCOPY;  Service: Gastroenterology;  Laterality: N/A;  . GUM SURGERY  2004  . PLANTAR FASCIA RELEASE Left 08/19/2014   Procedure: ENDOSCOPIC PLANTAR FASCIOTOMY RELEASE L WITH TOPAZ;  Surgeon: Albertine Patricia, DPM;  Location: [place];  Service: Podiatry;  Laterality: Left;  LMA WITH POPLITEAL BLOCK  . TONSILLECTOMY  1984    Social History   Tobacco Use  . Smoking status: Former Smoker    Packs/day: 0.25    Years: 10.00    Pack years: 2.50    Types: Cigarettes    Quit date: 07/05/2014    Years since quitting: 4.1  . Smokeless tobacco: Never Used  Substance Use Topics  . Alcohol use: Yes    Comment: rare     Current Outpatient Medications:  .  acetaminophen (TYLENOL) 325 MG tablet, Take 650 mg by mouth every 6 (six) hours as needed. , Disp: , Rfl:  .  albuterol (VENTOLIN HFA) 108 (90 Base) MCG/ACT inhaler, Inhale 2 puffs into the lungs every 6 (six) hours as needed for wheezing or shortness of breath., Disp: 18 g, Rfl: 3 .  atorvastatin (LIPITOR) 20 MG tablet, Take 1 tablet (20 mg total) by mouth daily., Disp: 90 tablet, Rfl: 3 .  buPROPion (WELLBUTRIN SR) 150 MG 12 hr tablet, Take 150 mg by mouth 2 (two) times  daily., Disp: , Rfl:  .  BUPROPION HBR ER PO, Take by mouth., Disp: , Rfl:  .  diclofenac (VOLTAREN) 75 MG EC tablet, Take 75 mg by mouth 2 (two) times daily., Disp: , Rfl:  .  escitalopram (LEXAPRO) 20 MG tablet, Take 20 mg by mouth daily. , Disp: , Rfl:  .  hydrochlorothiazide (HYDRODIURIL) 25 MG tablet, TAKE 1 TABLET BY MOUTH DAILY, Disp: 90 tablet, Rfl: 0 .  hydrOXYzine (ATARAX/VISTARIL) 25 MG tablet, Take 50 mg by mouth 2 (two) times daily., Disp: , Rfl:  .  sulfamethoxazole-trimethoprim (BACTRIM DS) 800-160 MG tablet, Take 1 tablet by mouth 2 (two) times daily for 7 days., Disp: 4 tablet, Rfl: 0  Allergies  Allergen Reactions  . Latex Rash    ROS   No other  specific complaints in a complete review of systems (except as listed in HPI above).  Objective  Vitals:   09/09/18 1425  Resp: 18  Temp: 98.3 F (36.8 C)     There is no height or weight on file to calculate BMI.  Nursing Note and Vital Signs reviewed.  Physical Exam  Constitutional: Patient appears well-developed and well-nourished. HENT: Head: Normocephalic and atraumatic. Pulmonary/Chest: Effort normal  Neurological: alert and oriented, speech normal.  Skin: No rash noted. No erythema.  Psychiatric: Patient has a normal mood and affect. behavior is normal. Judgment and thought content normal.    Assessment & Plan  1. Complicated UTI (urinary tract infection) Consider switching to cipro- patient will go to the hospital to get labs done.  - Basic Metabolic Panel (BMET); Future - CBC with Differential; Future - Urinalysis, Routine w reflex microscopic; Future   Follow Up Instructions:    I discussed the assessment and treatment plan with the patient. The patient was provided an opportunity to ask questions and all were answered. The patient agreed with the plan and demonstrated an understanding of the instructions.   The patient was advised to call back or seek an in-person evaluation if the symptoms worsen or if the condition fails to improve as anticipated.  I provided 13 minutes of non-face-to-face time during this encounter.   Fredderick Severance, NP

## 2018-09-10 LAB — URINALYSIS, ROUTINE W REFLEX MICROSCOPIC
Bacteria, UA: NONE SEEN
Bilirubin Urine: NEGATIVE
Glucose, UA: NEGATIVE mg/dL
Hgb urine dipstick: NEGATIVE
Ketones, ur: NEGATIVE mg/dL
Leukocytes,Ua: NEGATIVE
Nitrite: NEGATIVE
Protein, ur: NEGATIVE mg/dL
Specific Gravity, Urine: 1.012 (ref 1.005–1.030)
pH: 5 (ref 5.0–8.0)

## 2018-09-25 ENCOUNTER — Other Ambulatory Visit: Payer: Self-pay

## 2018-09-25 ENCOUNTER — Encounter: Payer: Self-pay | Admitting: Family Medicine

## 2018-09-25 ENCOUNTER — Ambulatory Visit (INDEPENDENT_AMBULATORY_CARE_PROVIDER_SITE_OTHER): Payer: Self-pay | Admitting: Family Medicine

## 2018-09-25 VITALS — HR 98 | Temp 97.9°F | Resp 16 | Ht 67.0 in | Wt 308.6 lb

## 2018-09-25 DIAGNOSIS — L989 Disorder of the skin and subcutaneous tissue, unspecified: Secondary | ICD-10-CM

## 2018-09-25 DIAGNOSIS — Z0000 Encounter for general adult medical examination without abnormal findings: Secondary | ICD-10-CM

## 2018-09-25 MED ORDER — MUPIROCIN 2 % EX OINT: TOPICAL_OINTMENT | CUTANEOUS | 0 refills | Status: DC

## 2018-09-25 NOTE — Patient Instructions (Addendum)
Water aerobics, elliptical, stationary bike 1/2 Cup bleach in a full tub of water once a week.  Back Exercises These exercises help to make your trunk and back strong. They also help to keep the lower back flexible. Doing these exercises can help to prevent back pain or lessen existing pain.  If you have back pain, try to do these exercises 2-3 times each day or as told by your doctor.  As you get better, do the exercises once each day. Repeat the exercises more often as told by your doctor.  To stop back pain from coming back, do the exercises once each day, or as told by your doctor. Exercises Single knee to chest Do these steps 3-5 times in a row for each leg: 1. Lie on your back on a firm bed or the floor with your legs stretched out. 2. Bring one knee to your chest. 3. Grab your knee or thigh with both hands and hold them it in place. 4. Pull on your knee until you feel a gentle stretch in your lower back or buttocks. 5. Keep doing the stretch for 10-30 seconds. 6. Slowly let go of your leg and straighten it. Pelvic tilt Do these steps 5-10 times in a row: 1. Lie on your back on a firm bed or the floor with your legs stretched out. 2. Bend your knees so they point up to the ceiling. Your feet should be flat on the floor. 3. Tighten your lower belly (abdomen) muscles to press your lower back against the floor. This will make your tailbone point up to the ceiling instead of pointing down to your feet or the floor. 4. Stay in this position for 5-10 seconds while you gently tighten your muscles and breathe evenly. Cat-cow Do these steps until your lower back bends more easily: 1. Get on your hands and knees on a firm surface. Keep your hands under your shoulders, and keep your knees under your hips. You may put padding under your knees. 2. Let your head hang down toward your chest. Tighten (contract) the muscles in your belly. Point your tailbone toward the floor so your lower back  becomes rounded like the back of a cat. 3. Stay in this position for 5 seconds. 4. Slowly lift your head. Let the muscles of your belly relax. Point your tailbone up toward the ceiling so your back forms a sagging arch like the back of a cow. 5. Stay in this position for 5 seconds.  Press-ups Do these steps 5-10 times in a row: 1. Lie on your belly (face-down) on the floor. 2. Place your hands near your head, about shoulder-width apart. 3. While you keep your back relaxed and keep your hips on the floor, slowly straighten your arms to raise the top half of your body and lift your shoulders. Do not use your back muscles. You may change where you place your hands in order to make yourself more comfortable. 4. Stay in this position for 5 seconds. 5. Slowly return to lying flat on the floor.  Bridges Do these steps 10 times in a row: 1. Lie on your back on a firm surface. 2. Bend your knees so they point up to the ceiling. Your feet should be flat on the floor. Your arms should be flat at your sides, next to your body. 3. Tighten your butt muscles and lift your butt off the floor until your waist is almost as high as your knees. If you do not  feel the muscles working in your butt and the back of your thighs, slide your feet 1-2 inches farther away from your butt. 4. Stay in this position for 3-5 seconds. 5. Slowly lower your butt to the floor, and let your butt muscles relax. If this exercise is too easy, try doing it with your arms crossed over your chest. Belly crunches Do these steps 5-10 times in a row: 1. Lie on your back on a firm bed or the floor with your legs stretched out. 2. Bend your knees so they point up to the ceiling. Your feet should be flat on the floor. 3. Cross your arms over your chest. 4. Tip your chin a little bit toward your chest but do not bend your neck. 5. Tighten your belly muscles and slowly raise your chest just enough to lift your shoulder blades a tiny bit off  of the floor. Avoid raising your body higher than that, because it can put too much stress on your low back. 6. Slowly lower your chest and your head to the floor. Back lifts Do these steps 5-10 times in a row: 1. Lie on your belly (face-down) with your arms at your sides, and rest your forehead on the floor. 2. Tighten the muscles in your legs and your butt. 3. Slowly lift your chest off of the floor while you keep your hips on the floor. Keep the back of your head in line with the curve in your back. Look at the floor while you do this. 4. Stay in this position for 3-5 seconds. 5. Slowly lower your chest and your face to the floor. Contact a doctor if:  Your back pain gets a lot worse when you do an exercise.  Your back pain does not get better 2 hours after you exercise. If you have any of these problems, stop doing the exercises. Do not do them again unless your doctor says it is okay. Get help right away if:  You have sudden, very bad back pain. If this happens, stop doing the exercises. Do not do them again unless your doctor says it is okay. This information is not intended to replace advice given to you by your health care provider. Make sure you discuss any questions you have with your health care provider. Document Released: 01/27/2010 Document Revised: 09/19/2017 Document Reviewed: 09/19/2017 Elsevier Patient Education  2020 [employer].
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
## 2019-01-05 ENCOUNTER — Other Ambulatory Visit: Payer: Self-pay | Admitting: Family Medicine

## 2019-01-06 NOTE — Telephone Encounter (Signed)
Please schedule patient for follow up in the next 30 days.  

## 2019-01-06 NOTE — Telephone Encounter (Signed)
lft vm to Please schedule patient for follow up in the next 30 days

## 2019-01-12 ENCOUNTER — Encounter: Payer: Self-pay | Admitting: Family Medicine

## 2019-01-12 MED ORDER — HYDROCHLOROTHIAZIDE 25 MG PO TABS: 25.0000 mg | ORAL_TABLET | Freq: Every day | ORAL | 0 refills | Status: DC

## 2019-01-14 ENCOUNTER — Encounter: Payer: Self-pay | Admitting: Family Medicine

## 2019-01-14 ENCOUNTER — Other Ambulatory Visit: Payer: Self-pay

## 2019-01-14 ENCOUNTER — Ambulatory Visit (INDEPENDENT_AMBULATORY_CARE_PROVIDER_SITE_OTHER): Payer: Self-pay | Admitting: Family Medicine

## 2019-01-14 VITALS — Temp 97.7°F | Ht 64.5 in | Wt 327.2 lb

## 2019-01-14 DIAGNOSIS — M545 Low back pain: Secondary | ICD-10-CM

## 2019-01-14 DIAGNOSIS — G4733 Obstructive sleep apnea (adult) (pediatric): Secondary | ICD-10-CM | POA: Insufficient documentation

## 2019-01-14 DIAGNOSIS — G8929 Other chronic pain: Secondary | ICD-10-CM

## 2019-01-14 DIAGNOSIS — I10 Essential (primary) hypertension: Secondary | ICD-10-CM

## 2019-01-14 DIAGNOSIS — Z9989 Dependence on other enabling machines and devices: Secondary | ICD-10-CM

## 2019-01-14 DIAGNOSIS — F4310 Post-traumatic stress disorder, unspecified: Secondary | ICD-10-CM

## 2019-01-14 DIAGNOSIS — J4520 Mild intermittent asthma, uncomplicated: Secondary | ICD-10-CM

## 2019-01-14 DIAGNOSIS — F419 Anxiety disorder, unspecified: Secondary | ICD-10-CM | POA: Insufficient documentation

## 2019-01-14 DIAGNOSIS — E782 Mixed hyperlipidemia: Secondary | ICD-10-CM

## 2019-01-14 DIAGNOSIS — Z6841 Body Mass Index (BMI) 40.0 and over, adult: Secondary | ICD-10-CM | POA: Insufficient documentation

## 2019-01-14 DIAGNOSIS — F603 Borderline personality disorder: Secondary | ICD-10-CM | POA: Insufficient documentation

## 2019-01-14 MED ORDER — TIZANIDINE HCL 2 MG PO CAPS: 2.0000 mg | ORAL_CAPSULE | Freq: Every evening | ORAL | 0 refills | Status: DC | PRN

## 2019-01-14 NOTE — Progress Notes (Signed)
Name: Barbara Pennington   MRN: [id number]    DOB: 11/03/1980   Date:01/14/2019       Progress Note  Subjective  Chief Complaint  Chief Complaint  Patient presents with  . Muscle Pain    follow up, medication refill  . Hypertension  . Hyperlipidemia  . Depression    I connected with  Amanie L Lehnen on 01/14/19 at 11:20 AM EST by telephone and verified that I am speaking with the correct person using two identifiers.   I discussed the limitations, risks, security and privacy concerns of performing an evaluation and management service by telephone and the availability of in person appointments. Staff also discussed with the patient that there may be a patient responsible charge related to this service. Patient Location: Home Provider Location: Home Office Additional Individuals present: None  HPI  Psychiatric Concerns: She is now seeing [facility] for care - Rx'd Lexapro and Wellbutrin.  She states does not have time in her schedule for counseling at this time.  She was diagnosed with BPD, PTSD, Depression, and Anxiety. She has always struggled with anxiety, but was much worse after she developed PTSD in 2009/2010.  She has had 3 prior suicide attempts - she has attempted overdose on each occasion.  She was not hospitalized but did seek help with the national suicide hotline. Has never been hospitalized. Denies SI/HI.   HTN: She was diagnosed in the ER in November 2019 and was but on HCTZ 25mg .  She is trying to follow a low salt diet. Denies chest pain; has ongoing BLE edema that is mild - not elevating at night or wearing compression stockings because she has not found any that fit..  Does get ShOB sometimes - situational, sometimes allergy related and deconditioning from being overweight.  EKG from 11/2017 does not show LVH.  Obesity: In high school she was about 180lbs, after having her children she reached her heaviest weight at 260lbs.  In 2009/2010 - she went through a really bad experience  and got down to less than 180 again.  Depression contributes to her overeating.  She notes heat intolerance ongoing for quite some time.  She is trying to do home exercises now, flaring her back pain a bit - discussed in detail with her.   Chronic Low Back Pain: Has had for about 2 years now, taking tizanidine PRN - needs refill today.  She notes that she has been trying to exercise more and back pain has increased a bit with this.  Denies numbness/tingling/weakness of BLE.  OSA/Asthma:  She is using her CPAP machine regularly, has albuterol PRN.  She notes shortness of breath with exertion - she is working on reconditiong with a home exercise plan.    HLD: She is taking atorvastatin and no concerns at this time; no myalgias, no chest pain, no reported SE's. Due for lab recheck today.   Patient Active Problem List   Diagnosis Date Noted  . Asthma 04/30/2018  . Low back pain 08/20/2017  . 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 Mastitis, left, acute 12/20/2016  . Hypertension     Social History   Tobacco Use  . Smoking status: Former Smoker    Packs/day: 0.25    Years: 10.00    Pack years: 2.50    Types: Cigarettes    Quit date: 07/05/2014    Years since quitting: 4.5  . Smokeless tobacco: Never Used  Substance Use Topics  . Alcohol use: Yes  Comment: rare     Current Outpatient Medications:  .  acetaminophen (TYLENOL) 325 MG tablet, Take 650 mg by mouth every 6 (six) hours as needed. , Disp: , Rfl:  .  albuterol (VENTOLIN HFA) 108 (90 Base) MCG/ACT inhaler, Inhale 2 puffs into the lungs every 6 (six) hours as needed for wheezing or shortness of breath., Disp: 18 g, Rfl: 3 .  atorvastatin (LIPITOR) 20 MG tablet, Take 1 tablet (20 mg total) by mouth daily., Disp: 90 tablet, Rfl: 3 .  buPROPion (WELLBUTRIN SR) 150 MG 12 hr tablet, Take 150 mg by mouth 2 (two) times daily., Disp: , Rfl:  .  BUPROPION HBR ER PO, Take by mouth., Disp: , Rfl:  .  diclofenac (VOLTAREN) 75 MG  EC tablet, Take 75 mg by mouth 2 (two) times daily., Disp: , Rfl:  .  escitalopram (LEXAPRO) 20 MG tablet, Take 20 mg by mouth daily. , Disp: , Rfl:  .  hydrochlorothiazide (HYDRODIURIL) 25 MG tablet, Take 1 tablet (25 mg total) by mouth daily., Disp: 90 tablet, Rfl: 0 .  hydrOXYzine (ATARAX/VISTARIL) 25 MG tablet, Take 50 mg by mouth 2 (two) times daily., Disp: , Rfl:  .  mupirocin ointment (BACTROBAN) 2 %, Apply BID for 7 days., Disp: 22 g, Rfl: 0 .  tizanidine (ZANAFLEX) 2 MG capsule, Take 1-2 capsules (2-4 mg total) by mouth at bedtime as needed for muscle spasms., Disp: 12 capsule, Rfl: 0  Allergies  Allergen Reactions  . Latex Rash    I personally reviewed active problem list, medication list, allergies, notes from last encounter, lab results with the patient/caregiver today.  ROS  Constitutional: Negative for fever or weight change.  Respiratory: Negative for cough; see HPI Cardiovascular: Negative for chest pain or palpitations.  Gastrointestinal: Negative for abdominal pain, no bowel changes.  Musculoskeletal: Negative for gait problem or joint swelling.  Skin: Negative for rash.  Neurological: Negative for dizziness or headache.  No other specific complaints in a complete review of systems (except as listed in HPI above).   Objective  Virtual encounter, vitals not obtained.  Body mass index is 55.3 kg/m.  Nursing Note and Vital Signs reviewed.  Depression screen [facility] 2/9 01/14/2019 09/25/2018 09/04/2018 04/30/2018 04/04/2018  Decreased Interest 1 1 1  0 0  Down, Depressed, Hopeless 1 1 0 0 0  PHQ - 2 Score 2 2 1  0 0  Altered sleeping 1 3 3 2 3   Tired, decreased energy 1 3 3 2 3   Change in appetite 1 3 3 2 3   Feeling bad or failure about yourself  0 1 0 0 1  Trouble concentrating 1 0 0 1 1  Moving slowly or fidgety/restless 1 0 0 0 0  Suicidal thoughts 0 0 0 0 0  PHQ-9 Score 7 12 10 7 11   Difficult doing work/chores Somewhat difficult Somewhat difficult Somewhat  difficult Somewhat difficult Somewhat difficult   PHQ-9 score is improved but still positive.  Physical Exam  Constitutional: Patient appears well-developed and well-nourished. No distress.  HENT: Head: Normocephalic and atraumatic.  Neck: Normal range of motion. Pulmonary/Chest: Effort normal. No respiratory distress. Speaking in complete sentences Neurological: Pt is alert and oriented to person, place, and time. Coordination, speech and gait are normal.  Psychiatric: Patient has a normal mood and affect. behavior is normal. Judgment and thought content normal.  No results found for this or any previous visit (from the past 72 hour(s)).  Assessment & Plan  1. PTSD (post-traumatic stress disorder)  2. Borderline personality disorder ([place]) 3. Anxiety - Seeing [facility], doing well on current medication regimen  4. Essential hypertension - Taking HCTZ without issues; still having some mild BLE edema, will write to have her fitted for compression stockings as she has wide calves and OTC have not fit her.  5. Mixed hyperlipidemia - Continue statin therapy - Lipid panel; Future  6. Morbid obesity with BMI of 50.0-59.9, adult ([place]) - Discussed importance of 150 minutes of physical activity weekly, eat two servings of fish weekly, eat one serving of tree nuts ( cashews, pistachios, pecans, almonds.Marland Kitchen) every other day, eat 6 servings of fruit/vegetables daily and drink plenty of water and avoid sweet beverages.  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  7. Chronic bilateral low back pain without sciatica - tizanidine (ZANAFLEX) 2 MG capsule; Take 1-2 capsules (2-4 mg total) by mouth at bedtime as needed for muscle spasms.  Dispense: 30 capsule; Refill: 0  8. Mild intermittent asthma without complication - Albuterol PRN  9. OSA on CPAP - CPAP  - I discussed the assessment and treatment plan with the patient. The patient was provided an opportunity to ask  questions and all were answered. The patient agreed with the plan and demonstrated an understanding of the instructions.  - The patient was advised to call back or seek an in-person evaluation if the symptoms worsen or if the condition fails to improve as anticipated.  I provided 24 minutes of non-face-to-face time during this encounter.  Hubbard Hartshorn, FNP

## 2019-01-15 ENCOUNTER — Encounter: Payer: Self-pay | Admitting: Family Medicine

## 2019-01-19 ENCOUNTER — Ambulatory Visit: Payer: Self-pay

## 2019-01-23 ENCOUNTER — Encounter: Payer: Self-pay | Admitting: Family Medicine

## 2019-01-23 NOTE — Telephone Encounter (Signed)
Good morning. I am not sure what happened here. Due to it being a nurse visit it will not affect you. Let us know if we can help you in any other way. Hope you have a great day and a great weekend.

## 2019-03-16 IMAGING — US US BREAST*L* LIMITED INC AXILLA
1 series · 11 of 11 positions shown · non-contrast
Comparison: Previous exam(s).

CLINICAL DATA: 36-year-old female with acute breast pain and
swelling for 4 days.

EXAM:
ULTRASOUND OF THE LEFT BREAST

[Series 1: us breast*left* limited inc axilla · 0.07mm/px · 11 of 11 slices shown]
[im 1/11]
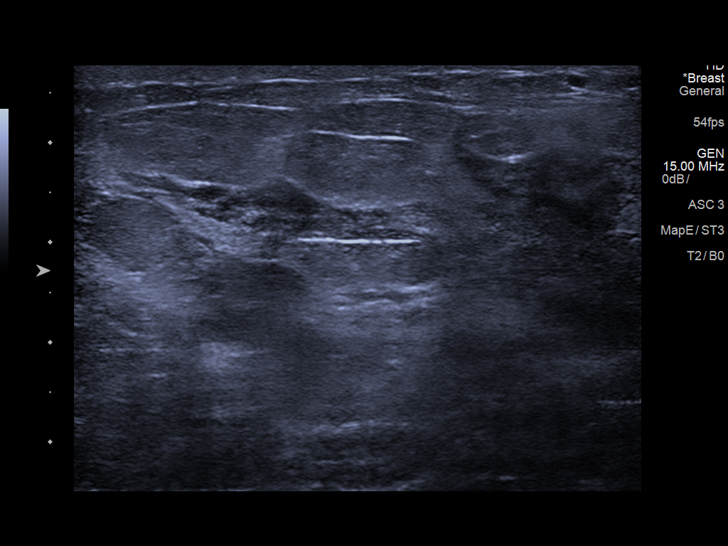
[im 2/11]
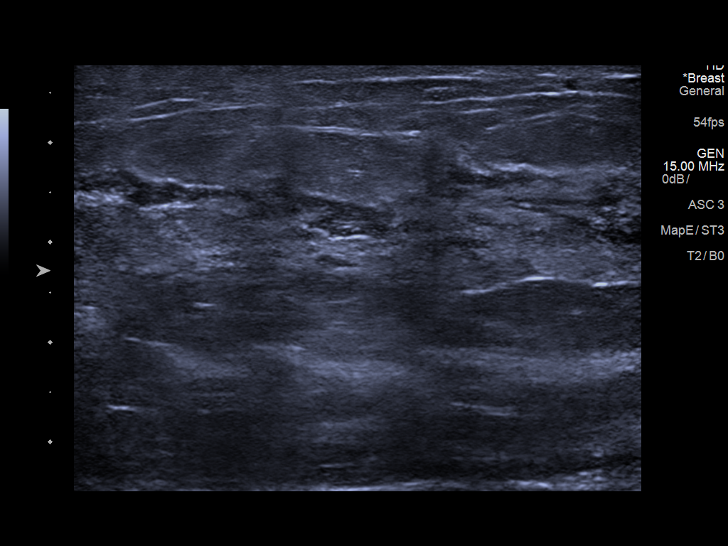
[im 3/11]
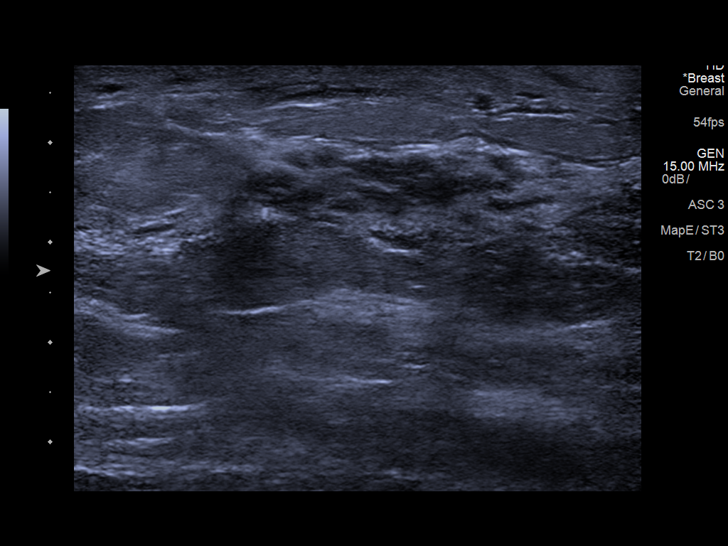
[im 4/11]
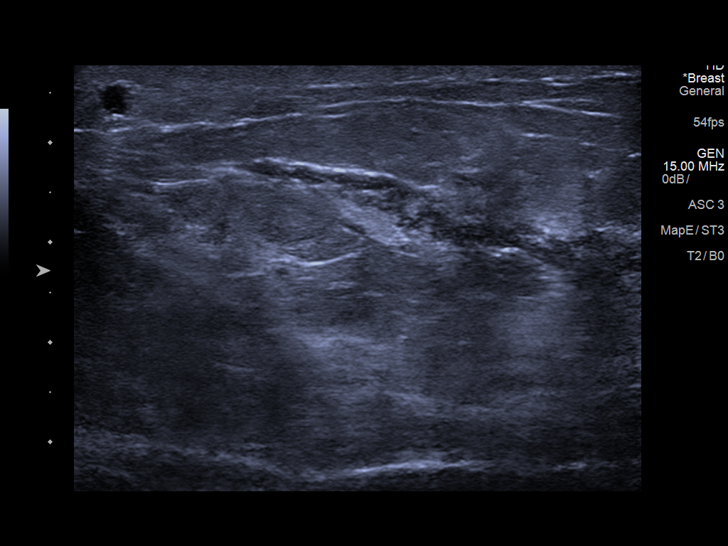
[im 5/11]
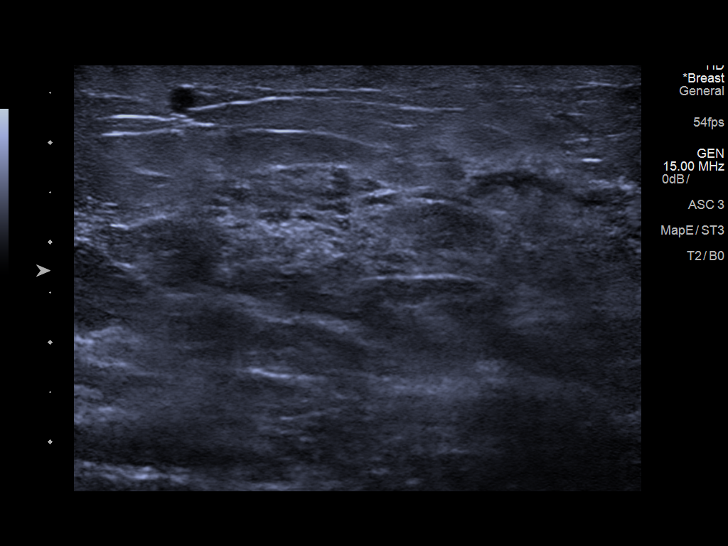
[im 6/11]
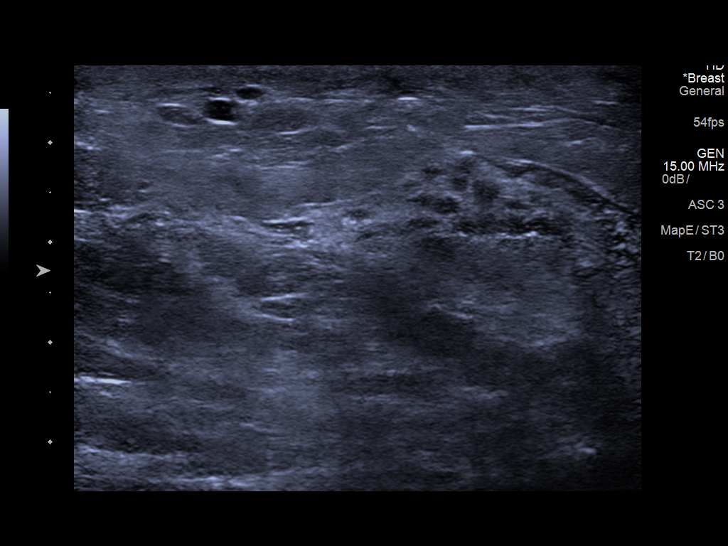
[im 7/11]
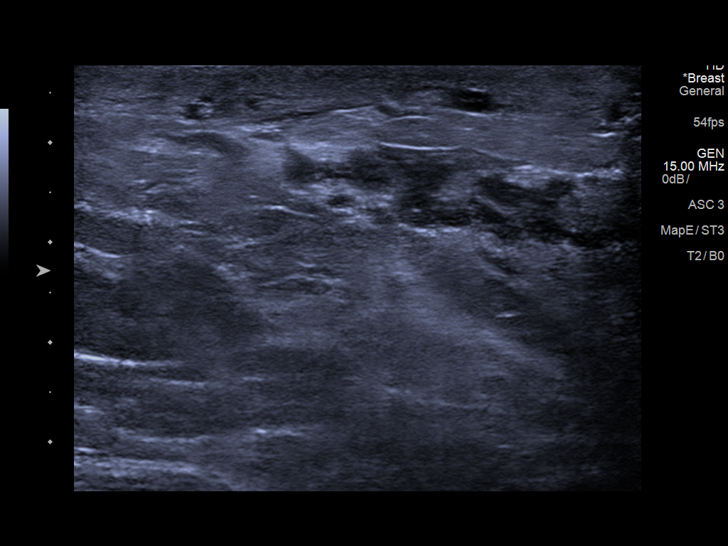
[im 8/11]
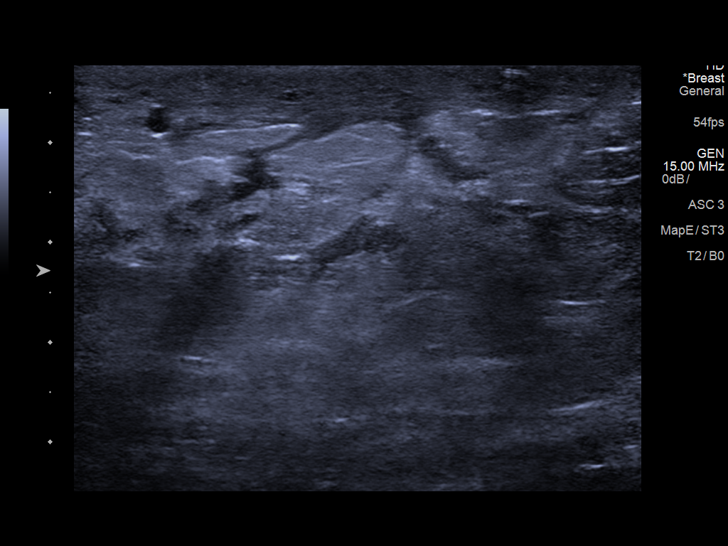
[im 9/11]
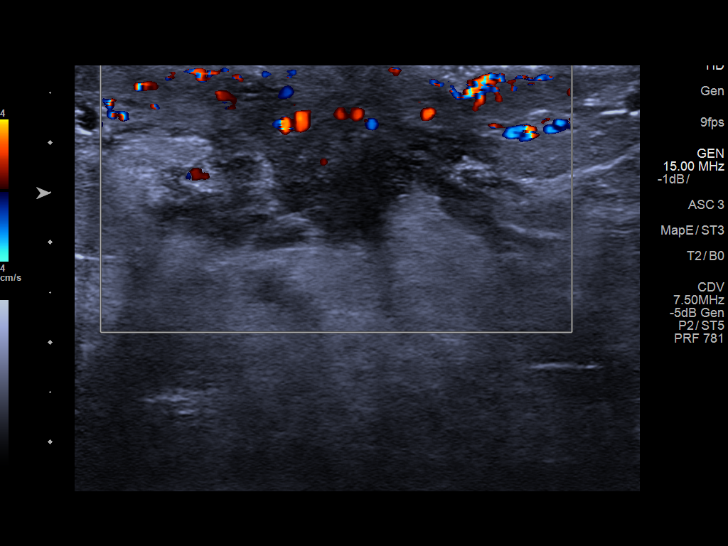
[im 10/11]
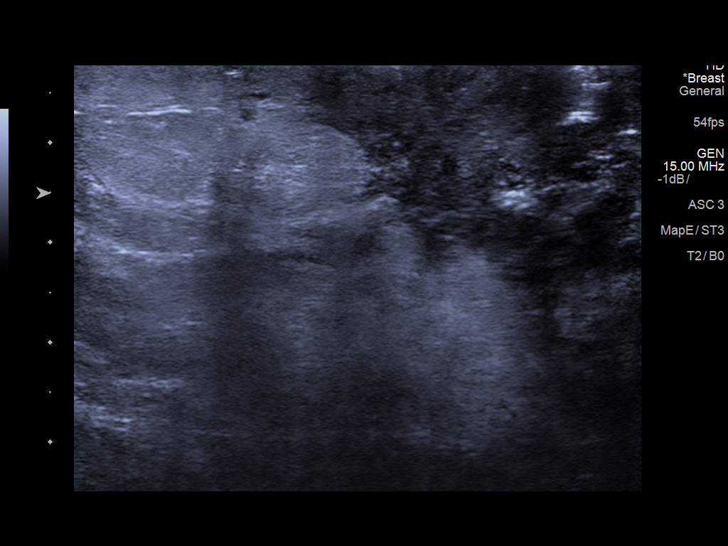
[im 11/11]
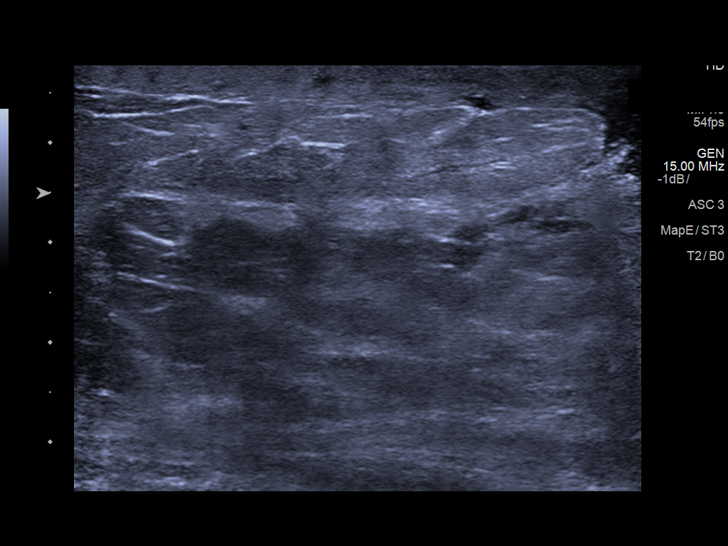

[11 of 11 positions shown; findings below may reference images not displayed]

FINDINGS: Targeted ultrasound is performed, showing a 1.4 cm ill-defined
hypoechoic area without internal vascular flow directly behind left
nipple and may suspicious for developing abscess.

No other significant abnormalities are noted.
IMPRESSION: 1.4 cm ill-defined hypoechoic area directly behind the left nipple
suspicious for developing abscess. Recommend left ultrasound
follow-up in 7-10 days with bilateral diagnostic mammograms to
exclude neoplasm.

RECOMMENDATION:
Bilateral diagnostic mammograms and left breast ultrasound in 7-10
days after appropriate antibiotic therapy or drainage.

## 2019-03-20 ENCOUNTER — Ambulatory Visit: Payer: Self-pay | Attending: Internal Medicine

## 2019-03-20 ENCOUNTER — Other Ambulatory Visit: Payer: Self-pay

## 2019-03-20 DIAGNOSIS — Z23 Encounter for immunization: Secondary | ICD-10-CM

## 2019-03-20 NOTE — Progress Notes (Signed)
   Covid-19 Vaccination Clinic  Name:  Barbara Pennington    MRN: [id number] DOB: [birth year]  03/20/2019  [name] was observed post Covid-19 immunization for 15 minutes without incident. She was provided with Vaccine Information Sheet and instruction to access the V-Safe system.   [name] was instructed to call 911 with any severe reactions post vaccine: Marland Kitchen Difficulty breathing  . Swelling of face and throat  . A fast heartbeat  . A bad rash all over body  . Dizziness and weakness   Immunizations Administered    Name Date Dose VIS Date Route   Pfizer COVID-19 Vaccine 03/20/2019  8:26 AM 0.3 mL 12/19/2018 Intramuscular   Manufacturer: [employer]   Lot: [id number]   [place]: [id number]

## 2019-03-27 ENCOUNTER — Other Ambulatory Visit: Payer: Self-pay | Admitting: Family Medicine

## 2019-03-27 DIAGNOSIS — G8929 Other chronic pain: Secondary | ICD-10-CM

## 2019-03-27 DIAGNOSIS — M545 Low back pain, unspecified: Secondary | ICD-10-CM

## 2019-03-27 IMAGING — US US BREAST*L* LIMITED INC AXILLA
1 series · 12 of 19 positions shown · non-contrast
Comparison: Previous exam(s).

CLINICAL DATA: 36-year-old female presenting for 10 day follow-up
of a probable left subareolar developing abscess. Patient reports
marked improvement of clinical symptoms since antibiotic treatment,
with mild residual tenderness remaining.

[Series 1: us breast*left* limited inc axilla · 0.06mm/px · 12 of 19 slices shown]
[im 1/19]
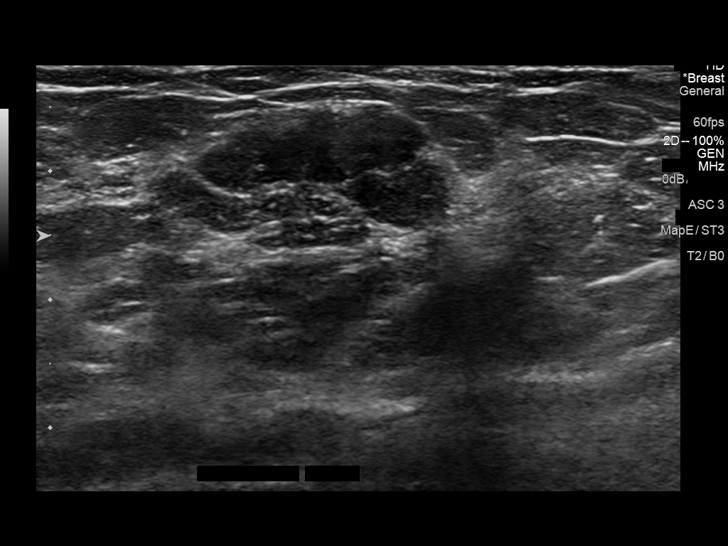
[im 3/19]
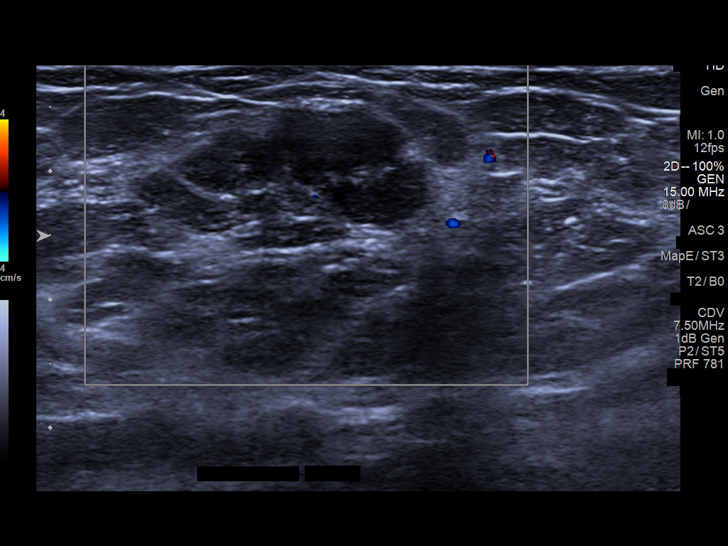
[im 4/19]
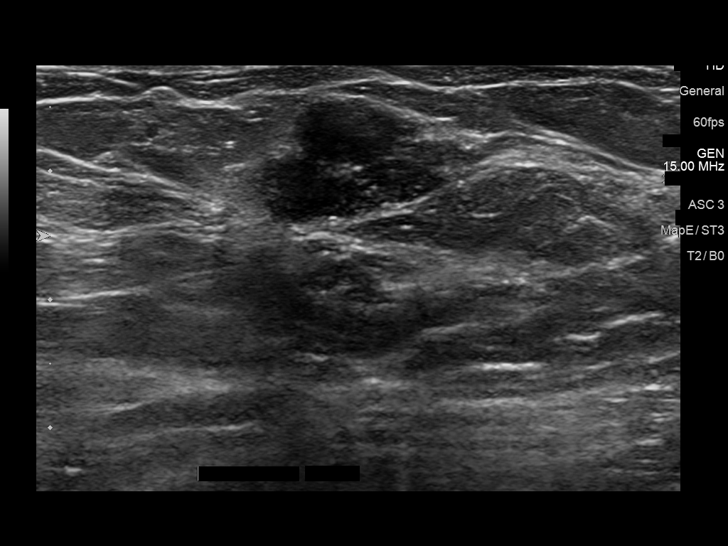
[im 6/19]
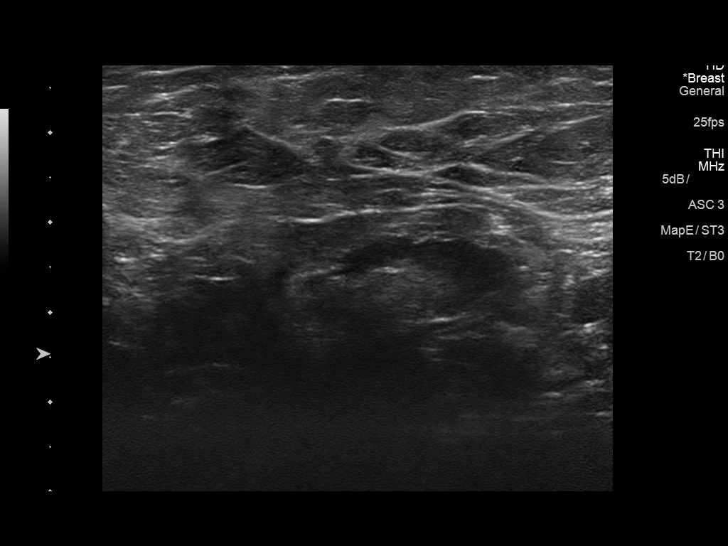
[im 8/19]
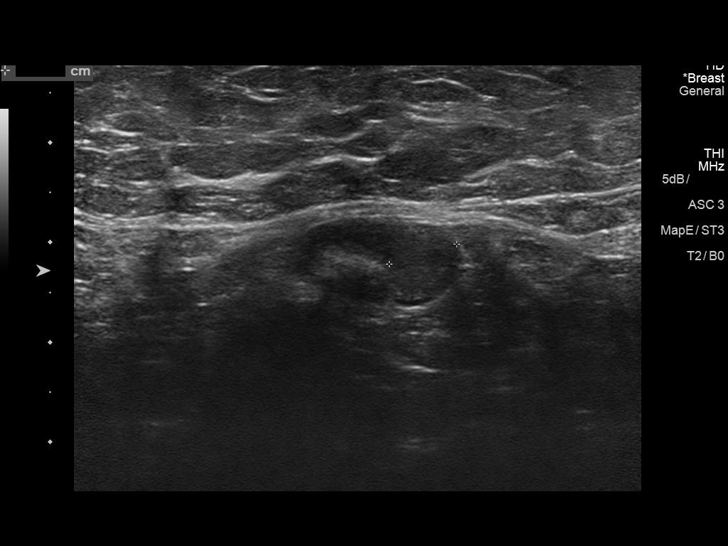
[im 9/19]
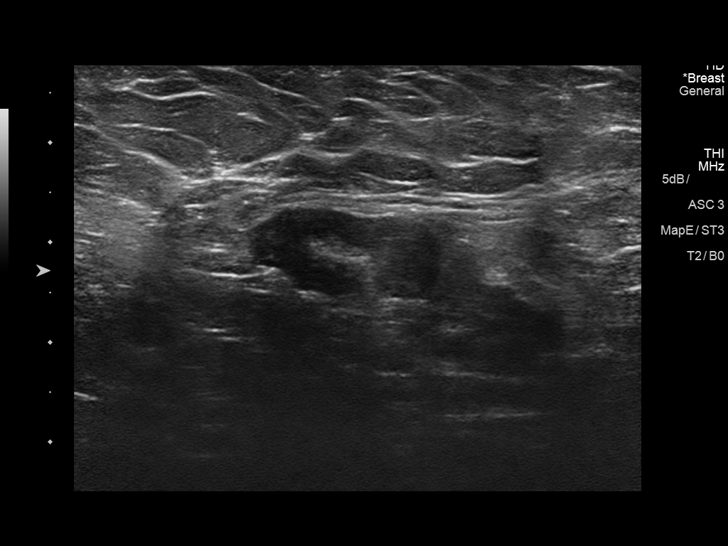
[im 11/19]
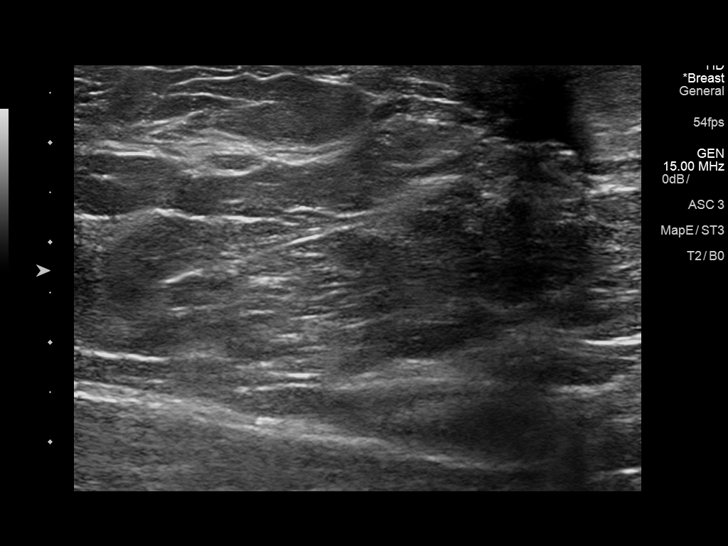
[im 12/19]
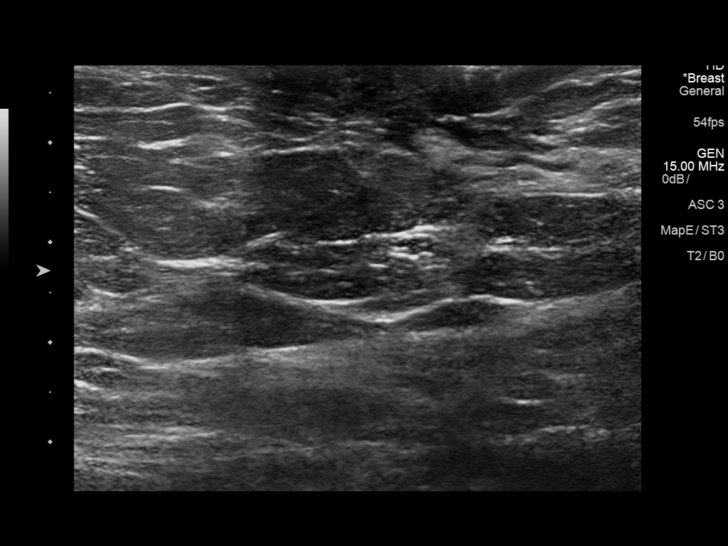
[im 14/19]
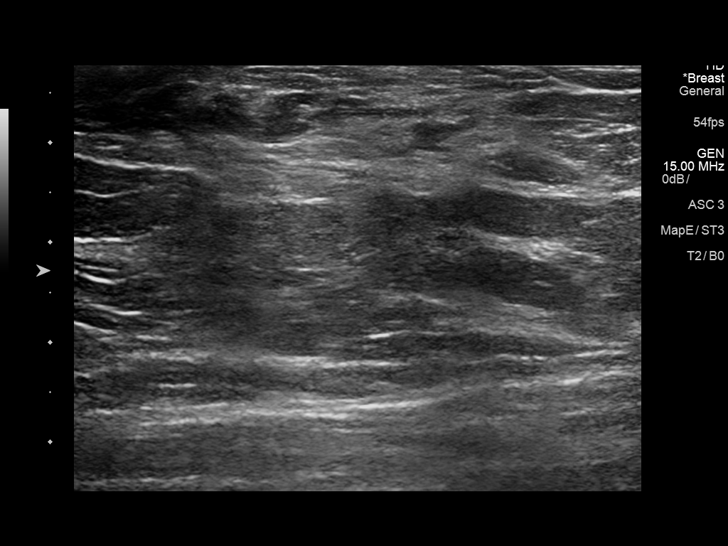
[im 16/19]
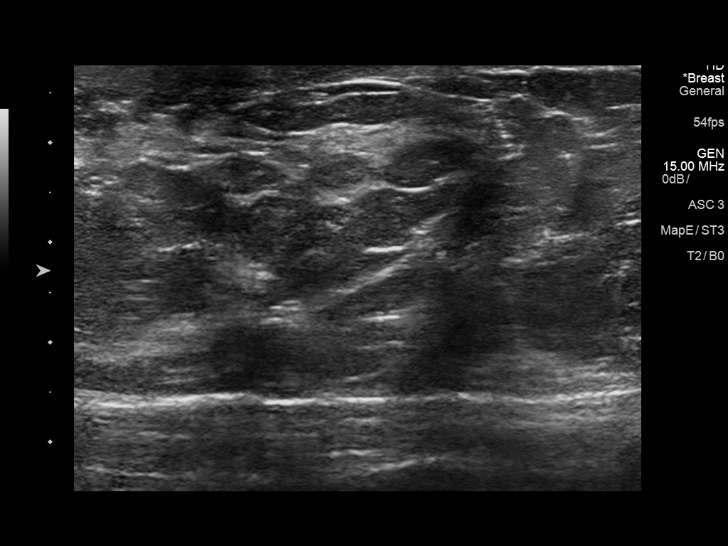
[im 17/19]
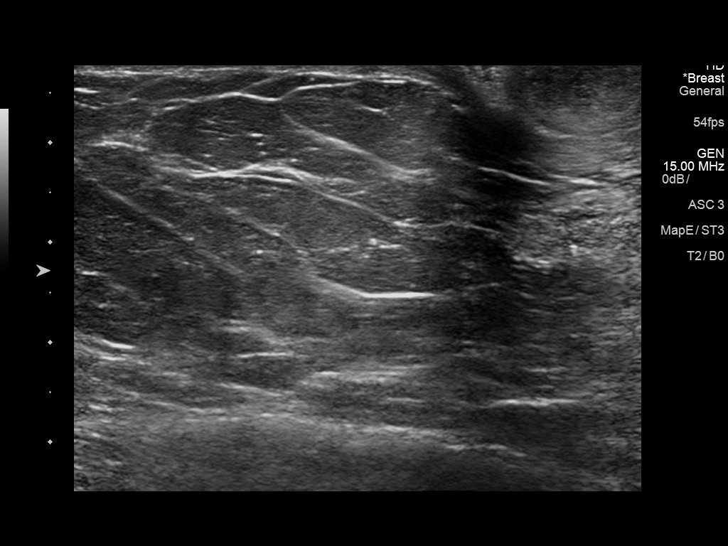
[im 19/19]
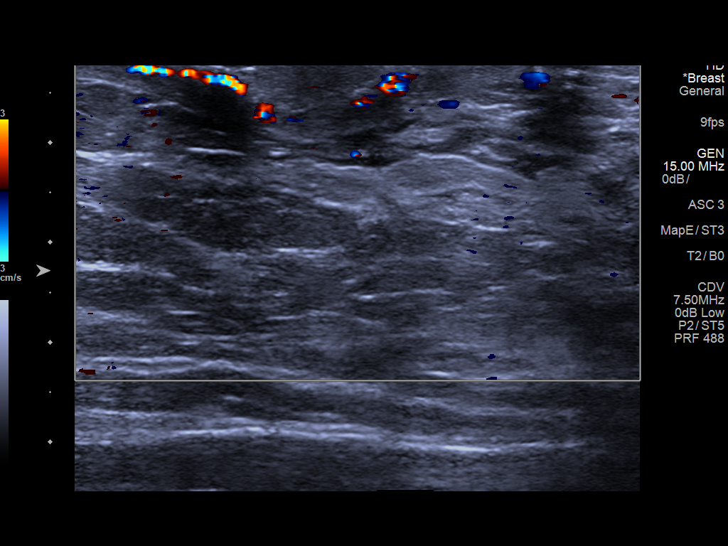

[12 of 19 positions shown; findings below may reference images not displayed]

Additionally, at the time of clinical evaluation, there was a
provider palpated left breast abnormality. The patient had a remote
biopsy in the left breast. Pathology reports are not available, but
the patient state the biopsied showed a fibroadenoma.

EXAM:
2D DIGITAL DIAGNOSTIC BILATERAL MAMMOGRAM WITH CAD AND ADJUNCT TOMO

ULTRASOUND LEFT BREAST
ACR Breast Density Category b: There are scattered areas of
fibroglandular density.
FINDINGS: A radiopaque BB was placed at the site of the provider palpated
abnormality in the upper outer quadrant at middle depth. Immediately
deep to the BB, a circumscribed, lobulated mass with post biopsy
clip is demonstrated. The appearance is grossly stable from 1211 and
slightly decreased in size from 7777.

An additional circumscribed mass in the upper outer quadrant at
posterior depth as seen on prior studies and likely representing an
intramammary lymph node appears slightly enlarged compared to prior
studies. Additional prominent left axillary lymph nodes are noted.

Mammographic images were processed with CAD.

Targeted ultrasound is performed, showing normal fibroglandular
tissue in the left subareolar region. No suspicious masses, cystic
lesions or focal sonographic abnormalities are identified.

Evaluation of the upper outer quadrant at the region of the
patient's palpable abnormality demonstrates a lobulated hypoechoic
mass at the 3 o'clock position 5 cm from the nipple. It measures 2 x
1.5 x 1.1 cm. This is stable in comparison to ultrasound studies
from Friday January, 2015 and 7777. No additional sonographic
abnormalities are identified in this region.

Additional evaluation of the left axilla demonstrates cortical
thickening of an axillary lymph node up to 0.7 cm. Findings may be
reactive from the patient's recent infection.
IMPRESSION: 1. Interval resolution of previous imaging findings in the left
subareolar region. The patient reports near complete resolution of
her clinical symptoms after antibiotic therapy. No further imaging
follow-up required.
2. Probably benign left axillary and intramammary lymphadenopathy
likely related to the patient's recent infection. Recommendation is
for mammographic and sonographic follow-up in 6-8 weeks to document
resolution.
3. Benign left breast mass corresponding with the provider palpated
abnormality. This corresponds to previously biopsied mass and is
stable dating back to 7777.
4. No mammographic evidence of malignancy on the right.

RECOMMENDATION:
Diagnostic left breast mammogram and axillary ultrasound in 6-8
weeks.

I have discussed the findings and recommendations with the patient.
Results were also provided in writing at the conclusion of the
visit. If applicable, a reminder letter will be sent to the patient
regarding the next appointment.

BI-RADS CATEGORY  3: Probably benign.

## 2019-03-27 MED ORDER — HYDROCHLOROTHIAZIDE 25 MG PO TABS: 25.0000 mg | ORAL_TABLET | Freq: Every day | ORAL | 3 refills | Status: DC

## 2019-03-27 MED ORDER — TIZANIDINE HCL 2 MG PO CAPS: 4.0000 mg | ORAL_CAPSULE | Freq: Every evening | ORAL | 1 refills | Status: DC | PRN

## 2019-03-27 NOTE — Telephone Encounter (Signed)
meds refilled, with change in dose of tizanidine felt reasonable after chart review.

## 2019-04-15 ENCOUNTER — Ambulatory Visit: Payer: Self-pay | Attending: Internal Medicine

## 2019-04-15 DIAGNOSIS — Z23 Encounter for immunization: Secondary | ICD-10-CM

## 2019-04-15 NOTE — Progress Notes (Signed)
   Covid-19 Vaccination Clinic  Name:  RENESHA MAPSON    MRN: [id number] DOB: 1980/04/03  04/15/2019  Ms. Brandner was observed post Covid-19 immunization for 15 minutes without incident. She was provided with Vaccine Information Sheet and instruction to access the V-Safe system.   Ms. Paolo was instructed to call 911 with any severe reactions post vaccine: Marland Kitchen Difficulty breathing  . Swelling of face and throat  . A fast heartbeat  . A bad rash all over body  . Dizziness and weakness   Immunizations Administered    Name Date Dose VIS Date Route   Pfizer COVID-19 Vaccine 04/15/2019  8:38 AM 0.3 mL 12/19/2018 Intramuscular   Manufacturer: [name]   Lot: [id number]   [place]: [id number]

## 2019-05-04 ENCOUNTER — Other Ambulatory Visit: Payer: Self-pay | Admitting: Family Medicine

## 2019-05-04 DIAGNOSIS — E782 Mixed hyperlipidemia: Secondary | ICD-10-CM

## 2019-05-07 ENCOUNTER — Other Ambulatory Visit: Payer: Self-pay | Admitting: Family Medicine

## 2019-05-07 DIAGNOSIS — E782 Mixed hyperlipidemia: Secondary | ICD-10-CM

## 2019-05-08 ENCOUNTER — Other Ambulatory Visit: Payer: Self-pay | Admitting: Family Medicine

## 2019-05-08 DIAGNOSIS — E782 Mixed hyperlipidemia: Secondary | ICD-10-CM

## 2019-05-08 NOTE — Telephone Encounter (Signed)
lvm to sch appt °

## 2019-05-08 NOTE — Telephone Encounter (Signed)
Please make appointment.

## 2019-06-11 DIAGNOSIS — N183 Chronic kidney disease, stage 3 unspecified: Secondary | ICD-10-CM | POA: Insufficient documentation

## 2019-06-30 ENCOUNTER — Other Ambulatory Visit: Payer: Self-pay

## 2019-06-30 MED ORDER — BUPROPION HCL ER (SR) 150 MG PO TB12: 150.0000 mg | ORAL_TABLET | Freq: Two times a day (BID) | ORAL | 0 refills | Status: DC

## 2019-06-30 NOTE — Addendum Note (Signed)
Addended by: Steele Sizer F on: 06/30/2019 05:57 PM   Modules accepted: Orders

## 2019-06-30 NOTE — Addendum Note (Signed)
Addended by: Chilton Greathouse on: 06/30/2019 10:47 AM   Modules accepted: Orders

## 2019-07-01 NOTE — Telephone Encounter (Signed)
lvm letting pt know that presriptions have been sent to pharmacy and for her to schedule an appt with either Leisa or Dr Roxan Hockey. Only a 30 day supply was sent in.

## 2019-07-15 ENCOUNTER — Other Ambulatory Visit: Payer: Self-pay | Admitting: Family Medicine

## 2019-07-15 ENCOUNTER — Other Ambulatory Visit: Payer: Self-pay | Admitting: Internal Medicine

## 2019-07-15 DIAGNOSIS — M545 Low back pain, unspecified: Secondary | ICD-10-CM

## 2019-07-15 MED ORDER — HYDROXYZINE HCL 25 MG PO TABS: 50.0000 mg | ORAL_TABLET | Freq: Two times a day (BID) | ORAL | 0 refills | Status: DC

## 2019-07-15 NOTE — Telephone Encounter (Signed)
Medication Refill - Medication: hydroxyzine   Has the patient contacted their pharmacy? Yes.   (Agent: If no, request that the patient contact the pharmacy for the refill.) (Agent: If yes, when and what did the pharmacy advise?)  Preferred Pharmacy (with phone number or street name):  [place], [place] - [address]  [address] [place] [address]  Phone: [phone] Fax: [phone]  Hours: Not open 24 hours    Agent: Please be advised that RX refills may take up to 3 business days. We ask that you follow-up with your pharmacy.

## 2019-07-27 ENCOUNTER — Encounter: Payer: Self-pay | Admitting: Family Medicine

## 2019-07-29 ENCOUNTER — Ambulatory Visit: Payer: Self-pay | Admitting: Family Medicine

## 2019-08-06 ENCOUNTER — Encounter: Payer: Self-pay | Admitting: Family Medicine

## 2019-08-06 ENCOUNTER — Ambulatory Visit (INDEPENDENT_AMBULATORY_CARE_PROVIDER_SITE_OTHER): Payer: Self-pay | Admitting: Family Medicine

## 2019-08-06 ENCOUNTER — Other Ambulatory Visit
Admission: RE | Admit: 2019-08-06 | Discharge: 2019-08-06 | Disposition: A | Discharge status: Home / Self Care | Payer: Self-pay | Attending: Family Medicine | Admitting: Family Medicine

## 2019-08-06 ENCOUNTER — Other Ambulatory Visit: Payer: Self-pay

## 2019-08-06 VITALS — BP 120/88 | HR 96 | Temp 97.1°F | Resp 16 | Ht 64.5 in | Wt 320.4 lb

## 2019-08-06 DIAGNOSIS — Z131 Encounter for screening for diabetes mellitus: Secondary | ICD-10-CM | POA: Insufficient documentation

## 2019-08-06 DIAGNOSIS — J4520 Mild intermittent asthma, uncomplicated: Secondary | ICD-10-CM

## 2019-08-06 DIAGNOSIS — G8929 Other chronic pain: Secondary | ICD-10-CM

## 2019-08-06 DIAGNOSIS — Z8719 Personal history of other diseases of the digestive system: Secondary | ICD-10-CM

## 2019-08-06 DIAGNOSIS — G4733 Obstructive sleep apnea (adult) (pediatric): Secondary | ICD-10-CM

## 2019-08-06 DIAGNOSIS — E782 Mixed hyperlipidemia: Secondary | ICD-10-CM

## 2019-08-06 DIAGNOSIS — Z9989 Dependence on other enabling machines and devices: Secondary | ICD-10-CM

## 2019-08-06 DIAGNOSIS — F603 Borderline personality disorder: Secondary | ICD-10-CM

## 2019-08-06 DIAGNOSIS — M545 Low back pain, unspecified: Secondary | ICD-10-CM

## 2019-08-06 DIAGNOSIS — Z6841 Body Mass Index (BMI) 40.0 and over, adult: Secondary | ICD-10-CM | POA: Insufficient documentation

## 2019-08-06 DIAGNOSIS — Z86010 Personal history of colonic polyps: Secondary | ICD-10-CM

## 2019-08-06 DIAGNOSIS — F331 Major depressive disorder, recurrent, moderate: Secondary | ICD-10-CM

## 2019-08-06 DIAGNOSIS — I10 Essential (primary) hypertension: Secondary | ICD-10-CM

## 2019-08-06 DIAGNOSIS — F411 Generalized anxiety disorder: Secondary | ICD-10-CM

## 2019-08-06 LAB — CBC WITH DIFFERENTIAL/PLATELET
Abs Immature Granulocytes: 0.02 10*3/uL (ref 0.00–0.07)
Basophils Absolute: 0.1 10*3/uL (ref 0.0–0.1)
Basophils Relative: 1 %
Eosinophils Absolute: 0.3 10*3/uL (ref 0.0–0.5)
Eosinophils Relative: 5 %
HCT: 40.8 % (ref 36.0–46.0)
Hemoglobin: 14.2 g/dL (ref 12.0–15.0)
Immature Granulocytes: 0 %
Lymphocytes Relative: 41 %
Lymphs Abs: 2.6 10*3/uL (ref 0.7–4.0)
MCH: 30.7 pg (ref 26.0–34.0)
MCHC: 34.8 g/dL (ref 30.0–36.0)
MCV: 88.1 fL (ref 80.0–100.0)
Monocytes Absolute: 0.5 10*3/uL (ref 0.1–1.0)
Monocytes Relative: 7 %
Neutro Abs: 2.9 10*3/uL (ref 1.7–7.7)
Neutrophils Relative %: 46 %
Platelets: 209 10*3/uL (ref 150–400)
RBC: 4.63 MIL/uL (ref 3.87–5.11)
RDW: 13 % (ref 11.5–15.5)
WBC: 6.4 10*3/uL (ref 4.0–10.5)
nRBC: 0 % (ref 0.0–0.2)

## 2019-08-06 LAB — HEMOGLOBIN A1C
Hgb A1c MFr Bld: 5.6 % (ref 4.8–5.6)
Mean Plasma Glucose: 114 mg/dL

## 2019-08-06 LAB — COMPREHENSIVE METABOLIC PANEL
ALT: 27 U/L (ref 0–44)
AST: 12 U/L — ABNORMAL LOW (ref 15–41)
Albumin: 4.4 g/dL (ref 3.5–5.0)
Alkaline Phosphatase: 55 U/L (ref 38–126)
Anion gap: 10 (ref 5–15)
BUN: 12 mg/dL (ref 6–20)
CO2: 29 mmol/L (ref 22–32)
Calcium: 9.3 mg/dL (ref 8.9–10.3)
Chloride: 102 mmol/L (ref 98–111)
Creatinine, Ser: 1.41 mg/dL — ABNORMAL HIGH (ref 0.44–1.00)
GFR calc Af Amer: 54 mL/min — ABNORMAL LOW (ref >60)
GFR calc non Af Amer: 46 mL/min — ABNORMAL LOW (ref >60)
Glucose, Bld: 105 mg/dL — ABNORMAL HIGH (ref 70–99)
Potassium: 4.1 mmol/L (ref 3.5–5.1)
Sodium: 141 mmol/L (ref 135–145)
Total Bilirubin: 0.9 mg/dL (ref 0.3–1.2)
Total Protein: 7.3 g/dL (ref 6.5–8.1)

## 2019-08-06 LAB — LIPID PANEL
Cholesterol: 151 mg/dL (ref 0–200)
HDL: 33 mg/dL — ABNORMAL LOW (ref >40)
LDL Cholesterol: 91 mg/dL (ref 0–99)
Total CHOL/HDL Ratio: 4.6 RATIO
Triglycerides: 134 mg/dL (ref <150)
VLDL: 27 mg/dL (ref 0–40)

## 2019-08-06 MED ORDER — MODAFINIL 200 MG PO TABS: 200.0000 mg | ORAL_TABLET | Freq: Every day | ORAL | 0 refills | Status: DC

## 2019-08-06 MED ORDER — BUPROPION HCL ER (SR) 150 MG PO TB12: 150.0000 mg | ORAL_TABLET | Freq: Two times a day (BID) | ORAL | 0 refills | Status: DC

## 2019-08-06 MED ORDER — ATORVASTATIN CALCIUM 20 MG PO TABS: 20.0000 mg | ORAL_TABLET | Freq: Every day | ORAL | 5 refills | Status: DC

## 2019-08-06 MED ORDER — HYDROCHLOROTHIAZIDE 25 MG PO TABS: 25.0000 mg | ORAL_TABLET | Freq: Every day | ORAL | 5 refills | Status: DC

## 2019-08-06 MED ORDER — HYDROXYZINE HCL 50 MG PO TABS: 50.0000 mg | ORAL_TABLET | Freq: Two times a day (BID) | ORAL | 5 refills | Status: DC

## 2019-08-06 MED ORDER — TIZANIDINE HCL 4 MG PO CAPS: 4.0000 mg | ORAL_CAPSULE | Freq: Every evening | ORAL | 5 refills | Status: DC

## 2019-08-06 MED ORDER — ALBUTEROL SULFATE HFA 108 (90 BASE) MCG/ACT IN AERS: 2.0000 | INHALATION_SPRAY | Freq: Four times a day (QID) | RESPIRATORY_TRACT | 1 refills | Status: DC | PRN

## 2019-08-06 MED ORDER — ESCITALOPRAM OXALATE 20 MG PO TABS: 20.0000 mg | ORAL_TABLET | Freq: Every day | ORAL | 0 refills | Status: DC

## 2019-08-06 NOTE — Patient Instructions (Signed)
Hydroxyzine: try to cut down on the dose, maybe 25 mg in the morning and afternoon as needed and 50 mg at night Check Good RX to find out where you can get prescriptions for less cost Follow up with psychiatrist for depression

## 2019-08-06 NOTE — Progress Notes (Signed)
Name: Barbara Pennington   MRN: [id number]    DOB: 08-Jan-1981   Date:08/06/2019       Progress Note  Subjective  Chief Complaint  Chief Complaint  Patient presents with  . Referral    Needs a referral to GI for colonoscopy due to having rectal bleeding.  . Labs Only    She wants to make sure her labs are UTD.  . Weight Gain    She has issues about her weight gain and would like some help.    HPI  Psychiatric Concerns: She was going to  [facility] for care - Rx'd Lexapro and Wellbutrin.  She states does not have time in her schedule for counseling at this time.  She was diagnosed with BPD, PTSD, Depression, and Anxiety. She has always struggled with anxiety, but was much worse after she developed PTSD in 2009/2010. She has had 3 prior suicide attempts - she has attempted overdose on each occasion with her own medications She was not hospitalized but did seek help with the national suicide hotline. She states it is very expensive to go there, I explained I do not feel comfortable taking care of her psychiatric problems, I will send 30 day supply and she will call back to go back.   HTN: She was diagnosed in the ER in November 2019 and was but on HCTZ 25mg .  She drinks a lot of caffeine, coffee, diet Pepper and also energy drinks. She is trying to cut down on salt. Cooking more at home. Denies chest pain or palpitation   Obesity: In high school she was about 180lbs, after having her children she reached her heaviest weight at 260lbs. In 2009/2010 - she has been using her stationary bike, cooking more at home, she is frustrated because she only lost 4 lbs   Chronic Low Back Pain: going on for year, taking tizanidine at night. She denies radiculitis.   OSA/Asthma:  She is using her CPAP machine regularly, she feels tired during the day, she has been taking energy drinks and otc B12 supplementation. Discussed trying modafinil, she will get it through [facility] and see if it works for her .    HLD: She  is taking atorvastatin and no concerns at this time; no myalgias, no chest pain, no reported SE's. We will recheck labs today   Patient Active Problem List   Diagnosis Date Noted  . PTSD (post-traumatic stress disorder) 01/14/2019  . Borderline personality disorder ([place]) 01/14/2019  . Anxiety 01/14/2019  . Mixed hyperlipidemia 01/14/2019  . Morbid obesity with BMI of 50.0-59.9, adult ([place]) 01/14/2019  . OSA on CPAP 01/14/2019  . Asthma 04/30/2018  . Low back pain 08/20/2017  . 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 Mastitis, left, acute 12/20/2016  . Essential hypertension     Past Surgical History:  Procedure Laterality Date  . ABDOMINAL HYSTERECTOMY  2008  . BREAST BIOPSY Left 2013   CORE - FIBROADENOMA VS PHYLLODES  . CESAREAN SECTION  2007  . COLONOSCOPY WITH PROPOFOL N/A 10/09/2016   Procedure: COLONOSCOPY WITH PROPOFOL;  Surgeon: Jonathon Bellows, MD;  Location[facility] ENDOSCOPY;  Service: Gastroenterology;  Laterality: N/A;  . GUM SURGERY  2004  . PLANTAR FASCIA RELEASE Left 08/19/2014   Procedure: ENDOSCOPIC PLANTAR FASCIOTOMY RELEASE L WITH TOPAZ;  Surgeon: Albertine Patricia, DPM;  Location: [facility];  Service: Podiatry;  Laterality: Left;  LMA WITH POPLITEAL BLOCK  . TONSILLECTOMY  1984    Family History  Problem Relation  Age of Onset  . Testicular cancer Brother   . Colon cancer Paternal Grandfather   . Breast cancer Maternal Grandmother   . Hypertension Maternal Grandmother   . Aneurysm Maternal Grandmother   . Stroke Maternal Grandmother   . Obesity Mother   . Diabetes Mother   . High Cholesterol Mother   . Hypertension Mother   . Neuropathy Mother   . Depression Mother   . Anxiety disorder Mother   . Obesity Father   . Hypertension Father   . High Cholesterol Father   . Depression Father   . Anxiety disorder Father   . Hypertension Maternal Grandfather   . High Cholesterol Maternal Grandfather   . Heart disease Maternal Grandfather        has a  pacemaker  . Stroke Maternal Grandfather   . Alzheimer's disease Paternal Grandmother   . Dementia Paternal Grandmother     Social History   Tobacco Use  . Smoking status: Former Smoker    Packs/day: 0.25    Years: 10.00    Pack years: 2.50    Types: Cigarettes    Quit date: 07/05/2014    Years since quitting: 5.0  . Smokeless tobacco: Never Used  Substance Use Topics  . Alcohol use: Yes    Comment: rare     Current Outpatient Medications:  .  acetaminophen (TYLENOL) 325 MG tablet, Take 650 mg by mouth every 6 (six) hours as needed. , Disp: , Rfl:  .  albuterol (VENTOLIN HFA) 108 (90 Base) MCG/ACT inhaler, Inhale 2 puffs into the lungs every 6 (six) hours as needed for wheezing or shortness of breath., Disp: 18 g, Rfl: 1 .  atorvastatin (LIPITOR) 20 MG tablet, Take 1 tablet (20 mg total) by mouth daily., Disp: 30 tablet, Rfl: 5 .  buPROPion (WELLBUTRIN SR) 150 MG 12 hr tablet, Take 1 tablet (150 mg total) by mouth 2 (two) times daily., Disp: 60 tablet, Rfl: 0 .  escitalopram (LEXAPRO) 20 MG tablet, Take 1 tablet (20 mg total) by mouth daily., Disp: 30 tablet, Rfl: 0 .  hydrochlorothiazide (HYDRODIURIL) 25 MG tablet, Take 1 tablet (25 mg total) by mouth daily., Disp: 30 tablet, Rfl: 5 .  hydrOXYzine (ATARAX/VISTARIL) 50 MG tablet, Take 1 tablet (50 mg total) by mouth 2 (two) times daily., Disp: 60 tablet, Rfl: 5 .  tizanidine (ZANAFLEX) 4 MG capsule, Take 1 capsule (4 mg total) by mouth at bedtime., Disp: 30 capsule, Rfl: 5 .  modafinil (PROVIGIL) 200 MG tablet, Take 1 tablet (200 mg total) by mouth daily., Disp: 30 tablet, Rfl: 0  Allergies  Allergen Reactions  . Latex Rash    I personally reviewed active problem list, medication list, allergies, family history, social history, health maintenance with the patient/caregiver today.   ROS  Constitutional: Negative for fever or weight change.  Respiratory: Negative for cough , she has intermittent shortness of breath.    Cardiovascular: Negative for chest pain or palpitations.  Gastrointestinal: Negative for abdominal pain, no bowel changes.  Musculoskeletal: Negative for gait problem or joint swelling.  Skin: Negative for rash.  Neurological: Negative for dizziness or headache.  No other specific complaints in a complete review of systems (except as listed in HPI above).  Objective  Vitals:   08/06/19 0927  BP: (!) 120/88  Pulse: 96  Resp: 16  Temp: (!) 97.1 F (36.2 C)  TempSrc: Temporal  SpO2: 99%  Weight: (!) 320 lb 6.4 oz (145.3 kg)  Height: 5' 4.5" (1.638  m)    Body mass index is 54.15 kg/m.  Physical Exam  Constitutional: Patient appears well-developed and well-nourished. Obese  No distress.  HEENT: head atraumatic, normocephalic, pupils equal and reactive to light, , neck supple Cardiovascular: Normal rate, regular rhythm and normal heart sounds.  No murmur heard. Positive  BLE edema. Pulmonary/Chest: Effort normal and breath sounds normal. No respiratory distress. Abdominal: Soft.  There is no tenderness. Psychiatric: Patient has a normal mood and affect. behavior is normal. Judgment and thought content normal.  PHQ2/9: Depression screen [facility] 2/9 08/06/2019 01/14/2019 09/25/2018 09/04/2018 04/30/2018  Decreased Interest 1 1 1 1  0  Down, Depressed, Hopeless 1 1 1  0 0  PHQ - 2 Score 2 2 2 1  0  Altered sleeping 2 1 3 3 2   Tired, decreased energy 2 1 3 3 2   Change in appetite 1 1 3 3 2   Feeling bad or failure about yourself  0 0 1 0 0  Trouble concentrating 2 1 0 0 1  Moving slowly or fidgety/restless 0 1 0 0 0  Suicidal thoughts 0 0 0 0 0  PHQ-9 Score 9 7 12 10 7   Difficult doing work/chores Somewhat difficult Somewhat difficult Somewhat difficult Somewhat difficult Somewhat difficult    phq 9 is positive   Fall Risk: Fall Risk  08/06/2019 01/14/2019 09/25/2018 09/04/2018 04/30/2018  Falls in the past year? 0 0 0 0 1  Number falls in past yr: 0 0 0 0 0  Injury with Fall? 0 0 0 0 1   Follow up - Falls evaluation completed Falls evaluation completed Falls evaluation completed -     Assessment & Plan  1. Morbid obesity with BMI of 50.0-59.9, adult [facility])  Discussed with the patient the risk posed by an increased BMI. Discussed importance of portion control, calorie counting and at least 150 minutes of physical activity weekly. Avoid sweet beverages and drink more water. Eat at least 6 servings of fruit and vegetables daily   2. Mixed hyperlipidemia  - Lipid panel - atorvastatin (LIPITOR) 20 MG tablet; Take 1 tablet (20 mg total) by mouth daily.  Dispense: 30 tablet; Refill: 5  3. Essential hypertension  - COMPLETE METABOLIC PANEL WITH GFR - CBC with Differential/Platelet  4. Moderate episode of recurrent major depressive disorder ([place])  Needs to follow up with psychiatrist   5. Mild intermittent asthma without complication  - albuterol (VENTOLIN HFA) 108 (90 Base) MCG/ACT inhaler; Inhale 2 puffs into the lungs every 6 (six) hours as needed for wheezing or shortness of breath.  Dispense: 18 g; Refill: 1  6. Borderline personality disorder ([place])  - buPROPion (WELLBUTRIN SR) 150 MG 12 hr tablet; Take 1 tablet (150 mg total) by mouth 2 (two) times daily.  Dispense: 60 tablet; Refill: 0 - escitalopram (LEXAPRO) 20 MG tablet; Take 1 tablet (20 mg total) by mouth daily.  Dispense: 30 tablet; Refill: 0  7. OSA on CPAP  - CBC with Differential/Platelet - modafinil (PROVIGIL) 200 MG tablet; Take 1 tablet (200 mg total) by mouth daily.  Dispense: 30 tablet; Refill: 0  8. Diabetes mellitus screening  - Hemoglobin A1c  9. Chronic bilateral low back pain without sciatica  - tizanidine (ZANAFLEX) 4 MG capsule; Take 1 capsule (4 mg total) by mouth at bedtime.  Dispense: 30 capsule; Refill: 5  10. GAD (generalized anxiety disorder)  Try to cut down on hydroxizine since it may cause sedation and fatigue  11. History of rectal bleeding  - Ambulatory  referral to  Gastroenterology  12. History of colon polyps  - Ambulatory referral to Gastroenterology

## 2019-08-06 NOTE — Addendum Note (Signed)
Addended by: Docia Furl on: 08/06/2019 10:37 AM   Modules accepted: Orders

## 2019-08-10 ENCOUNTER — Other Ambulatory Visit: Payer: Self-pay | Admitting: Family Medicine

## 2019-08-10 ENCOUNTER — Telehealth: Payer: Self-pay | Admitting: Family Medicine

## 2019-08-10 DIAGNOSIS — G4733 Obstructive sleep apnea (adult) (pediatric): Secondary | ICD-10-CM

## 2019-08-10 MED ORDER — MODAFINIL 200 MG PO TABS: 200.0000 mg | ORAL_TABLET | Freq: Every day | ORAL | 2 refills | Status: DC

## 2019-08-10 NOTE — Telephone Encounter (Signed)
Copied from [place] [id number]. Topic: General - Inquiry >> Aug 10, 2019  2:10 PM Percell Belt A wrote: Reason for CRM: pt called in and would like the modafinil (PROVIGIL) 200 MG tablet [[id number]] resent to [facility] on [address]. She can is able to get it much cheaper there.  Patient is calling and would like Tizanidine changed from capsules to tablets.

## 2019-08-10 NOTE — Telephone Encounter (Signed)
Patient wants tablets instead of capsules.

## 2019-08-10 NOTE — Telephone Encounter (Signed)
Pharmacy called to inform the doctor that the patient is requesting tablets for the medication, tizanidine (ZANAFLEX) 4 MG capsule.  Please advise and call to confirm at [phone]

## 2019-08-11 MED ORDER — TIZANIDINE HCL 4 MG PO TABS: 4.0000 mg | ORAL_TABLET | Freq: Four times a day (QID) | ORAL | 0 refills | Status: DC | PRN

## 2019-08-11 NOTE — Telephone Encounter (Signed)
Patient's pharmacy calling to follow up. They state patient has called them multiple times to check status.

## 2019-08-13 ENCOUNTER — Encounter: Payer: Self-pay | Admitting: Family Medicine

## 2019-09-07 ENCOUNTER — Other Ambulatory Visit: Payer: Self-pay

## 2019-09-07 ENCOUNTER — Encounter: Payer: Self-pay | Admitting: Family Medicine

## 2019-09-08 ENCOUNTER — Other Ambulatory Visit: Payer: Self-pay | Admitting: Family Medicine

## 2019-09-08 MED ORDER — TIZANIDINE HCL 4 MG PO TABS: 4.0000 mg | ORAL_TABLET | Freq: Every evening | ORAL | 0 refills | Status: DC | PRN

## 2019-09-08 NOTE — Telephone Encounter (Signed)
Copied from [place] [phone]. Topic: General - Other >> Sep 07, 2019  4:56 PM Rainey Pines A wrote: Patient would like a callback from nurse in regards to needing her tiZANidine (ZANAFLEX) 4 MG tablet  medication form switched to tablets from capsules. Please Arnoldo Hooker

## 2019-09-20 ENCOUNTER — Other Ambulatory Visit: Payer: Self-pay | Admitting: Family Medicine

## 2019-09-25 ENCOUNTER — Ambulatory Visit: Payer: Self-pay | Admitting: *Deleted

## 2019-09-25 NOTE — Telephone Encounter (Signed)
Summary: Clinical Advice   Patient would like to speak with nurse for otc recommendations for her back pain and constipation. Please advise      Patient state she has back pain and constipation. Patient does have GI appointment- but it is 1 month out. Patient states she also has back pain - she does have a history of back injury- but reports her pain is in different area of back. Discussed OTC treatment for constipation and things she can try diet wise as welll. She is going to try to keep record of symptoms so she will have a clear picture for provider. Patient advised per protocol- if she is having moderate/severe back pain needs to be seen more urgently- she states she has had this pain for a couple weeks and will await her upcoming appointment next week. She will go to UC if she should get worse.  Reason for Disposition  MODERATE pain (e.g., interferes with normal activities or awakens from sleep)  Constipation is a chronic symptom (recurrent or ongoing AND present > 4 weeks)  Answer Assessment - Initial Assessment Questions 1. STOOL PATTERN OR FREQUENCY: "How often do you pass bowel movements (BMs)?"  (Normal range: tid to q 3 days)  "When was the last BM passed?"       Patient has stomach pain with BM- constipation or normal 2. STRAINING: "Do you have to strain to have a BM?"      Earlier today-yes 3. RECTAL PAIN: "Does your rectum hurt when the stool comes out?" If Yes, ask: "Do you have hemorrhoids? How bad is the pain?"  (Scale 1-10; or mild, moderate, severe)     Depends on amount of strianing 4. STOOL COMPOSITION: "Are the stools hard?"      yes 5. BLOOD ON STOOLS: "Has there been any blood on the toilet tissue or on the surface of the BM?" If Yes, ask: "When was the last time?"      Blood in toilet- drops- now on surface 6. CHRONIC CONSTIPATION: "Is this a new problem for you?"  If no, ask: "How long have you had this problem?" (days, weeks, months)      Yes- 2 months 7. CHANGES  IN DIET OR HYDRATION: "Have there been any recent changes in your diet?" "How much fluids are you drinking consuming on a daily basis?"  "How much have you had to drink today?"     Limiting portions, diet changes, hydration is good- 64-80 oz/day 8. MEDICATIONS: "Have you been taking any new medications?" "Are you taking any narcotic pain medications?" (e.g., Vicoden, Percocet, morphine, dilaudid)     Yes- supposed to help sleep apnea 9. LAXATIVES: "Have you been using any stool softeners, laxatives, or enemas?"  If yes, ask "What, how often, and when was the last time?" 10.ACTIVITY:  "How much walking do you do every day? on a daily basis?"  "Has your activity level decreased in the past week?"        no 11. CAUSE: "What do you think is causing the constipation?"        Not sure 12. OTHER SYMPTOMS: "Do you have any other symptoms?" (e.g., abdominal pain, bloating, fever, vomiting)       Back pain 13. MEDICAL HISTORY: "Do you have a history of hemorrhoids, rectal fissures, or rectal surgery or rectal abscess?"         GI- hemorrhoids 14. PREGNANCY: "Is there any chance you are pregnant?" "When was your last menstrual period?"  n/a  Answer Assessment - Initial Assessment Questions 1. ONSET: "When did the pain begin?"      Patient has history of back injury- increasing mid-August 2. LOCATION: "Where does it hurt?" (upper, mid or lower back)     RLQ 3. SEVERITY: "How bad is the pain?"  (e.g., Scale 1-10; mild, moderate, or severe)   - MILD (1-3): doesn't interfere with normal activities    - MODERATE (4-7): interferes with normal activities or awakens from sleep    - SEVERE (8-10): excruciating pain, unable to do any normal activities      moderate 4. PATTERN: "Is the pain constant?" (e.g., yes, no; constant, intermittent)      constant 5. RADIATION: "Does the pain shoot into your legs or elsewhere?"     no 6. CAUSE:  "What do you think is causing the back pain?"      unsure 7. BACK  OVERUSE:  "Any recent lifting of heavy objects, strenuous work or exercise?"     no 8. MEDICATIONS: "What have you taken so far for the pain?" (e.g., nothing, acetaminophen, NSAIDS)     Tylenol and muscle relaxor 9. NEUROLOGIC SYMPTOMS: "Do you have any weakness, numbness, or problems with bowel/bladder control?"     no 10. OTHER SYMPTOMS: "Do you have any other symptoms?" (e.g., fever, abdominal pain, burning with urination, blood in urine)       no 11. PREGNANCY: "Is there any chance you are pregnant?" (e.g., yes, no; LMP)       n/a  Protocols used: FLANK PAIN-A-AH, CONSTIPATION-A-AH, BACK PAIN-A-AH

## 2019-09-28 ENCOUNTER — Encounter: Payer: Self-pay | Admitting: Family Medicine

## 2019-09-29 ENCOUNTER — Other Ambulatory Visit: Payer: Self-pay

## 2019-09-29 ENCOUNTER — Encounter: Payer: Self-pay | Admitting: Family Medicine

## 2019-09-29 ENCOUNTER — Ambulatory Visit: Payer: 59 | Admitting: Family Medicine

## 2019-09-29 VITALS — BP 124/72 | HR 80 | Temp 98.1°F | Resp 18 | Ht 65.0 in | Wt 310.9 lb

## 2019-09-29 DIAGNOSIS — Z23 Encounter for immunization: Secondary | ICD-10-CM

## 2019-09-29 DIAGNOSIS — G8929 Other chronic pain: Secondary | ICD-10-CM

## 2019-09-29 DIAGNOSIS — M545 Low back pain: Secondary | ICD-10-CM

## 2019-09-29 DIAGNOSIS — K5900 Constipation, unspecified: Secondary | ICD-10-CM | POA: Diagnosis not present

## 2019-09-29 DIAGNOSIS — R109 Unspecified abdominal pain: Secondary | ICD-10-CM | POA: Diagnosis not present

## 2019-09-29 DIAGNOSIS — R809 Proteinuria, unspecified: Secondary | ICD-10-CM

## 2019-09-29 DIAGNOSIS — N179 Acute kidney failure, unspecified: Secondary | ICD-10-CM | POA: Diagnosis not present

## 2019-09-29 LAB — POCT URINALYSIS DIPSTICK
Bilirubin, UA: NEGATIVE
Blood, UA: NEGATIVE
Glucose, UA: NEGATIVE
Ketones, UA: NEGATIVE
Leukocytes, UA: NEGATIVE
Nitrite, UA: NEGATIVE
Protein, UA: POSITIVE — AB
Spec Grav, UA: 1.015 (ref 1.010–1.025)
Urobilinogen, UA: 0.2 E.U./dL
pH, UA: 5 (ref 5.0–8.0)

## 2019-09-29 MED ORDER — POLYETHYLENE GLYCOL 3350 17 GM/SCOOP PO POWD: 8.5000 g | Freq: Every day | ORAL | 1 refills | Status: DC | PRN

## 2019-09-29 MED ORDER — METHOCARBAMOL 500 MG PO TABS
500.0000 mg | ORAL_TABLET | Freq: Three times a day (TID) | ORAL | 1 refills | Status: DC | PRN
Start: 2019-09-29 — End: 2020-02-08

## 2019-09-29 MED ORDER — PREGABALIN 25 MG PO CAPS
25.0000 mg | ORAL_CAPSULE | Freq: Two times a day (BID) | ORAL | 0 refills | Status: DC
Start: 2019-09-29 — End: 2019-09-29

## 2019-09-29 NOTE — Patient Instructions (Addendum)
Avoid NSAIDs - like ibuprofen, aleve or naproxen  Can use tylenol 650- 1000 mg up to three times a day  Use heat or ice to affected areas  I strongly recommend PT and/or going to a spine or back specialists.  Xrays are ordered for you across the street.  For now I would avoid any and all narcotic meds due to your constipation, NSAIDs due to your kidney function, and that leaves only a few other medication treatment options - which will all not likely help your back or musculoskeletal pain.    Chronic Kidney Disease, Adult Chronic kidney disease (CKD) happens when the kidneys are damaged over a long period of time. The kidneys are two organs that help with:  Getting rid of waste and extra fluid from the blood.  Making hormones that maintain the amount of fluid in your tissues and blood vessels.  Making sure that the body has the right amount of fluids and chemicals. Most of the time, CKD does not go away, but it can usually be controlled. Steps must be taken to slow down the kidney damage or to stop it from getting worse. If this is not done, the kidneys may stop working. Follow these instructions at home: Medicines  Take over-the-counter and prescription medicines only as told by your doctor. You may need to change the amount of medicines you take.  Do not take any new medicines unless your doctor says it is okay. Many medicines can make your kidney damage worse.  Do not take any vitamin and supplements unless your doctor says it is okay. Many vitamins and supplements can make your kidney damage worse. General instructions  Follow a diet as told by your doctor. You may need to stay away from: ? Alcohol. ? Salty foods. ? Foods that are high in:  Potassium.  Calcium.  Protein.  Do not use any products that contain nicotine or tobacco, such as cigarettes and e-cigarettes. If you need help quitting, ask your doctor.  Keep track of your blood pressure at home. Tell your  doctor about any changes.  If you have diabetes, keep track of your blood sugar as told by your doctor.  Try to stay at a healthy weight. If you need help, ask your doctor.  Exercise at least 30 minutes a day, 5 days a week.  Stay up-to-date with your shots (immunizations) as told by your doctor.  Keep all follow-up visits as told by your doctor. This is important. Contact a doctor if:  Your symptoms get worse.  You have new symptoms. Get help right away if:  You have symptoms of end-stage kidney disease. These may include: ? Headaches. ? Numbness in your hands or feet. ? Easy bruising. ? Having hiccups often. ? Chest pain. ? Shortness of breath. ? Stopping of menstrual periods in women.  You have a fever.  You have very little pee (urine).  You have pain or bleeding when you pee. Summary  Chronic kidney disease (CKD) happens when the kidneys are damaged over a long period of time.  Most of the time, this condition does not go away, but it can usually be controlled. Steps must be taken to slow down the kidney damage or to stop it from getting worse.  Treatment may include a combination of medicines and lifestyle changes. This information is not intended to replace advice given to you by your health care provider. Make sure you discuss any questions you have with your health care provider. Document  Revised: 12/07/2016 Document Reviewed: 01/30/2016 Elsevier Patient Education  2020 [place].   Lumbar Strain A lumbar strain, which is sometimes called a low-back strain, is a stretch or tear in a muscle or the strong cords of tissue that attach muscle to bone (tendons) in the lower back (lumbar spine). This type of injury occurs when muscles or tendons are torn or are stretched beyond their limits. Lumbar strains can range from mild to severe. Mild strains may involve stretching a muscle or tendon without tearing it. These may heal in 1-2 weeks. More severe strains  involve tearing of muscle fibers or tendons. These will cause more pain and may take 6-8 weeks to heal. What are the causes? This condition may be caused by:  Trauma, such as a fall or a hit to the body.  Twisting or overstretching the back. This may result from doing activities that need a lot of energy, such as lifting heavy objects. What increases the risk? This injury is more common in:  Athletes.  People with obesity.  People who do repeated lifting, bending, or other movements that involve their back. What are the signs or symptoms? Symptoms of this condition may include:  Sharp or dull pain in the lower back that does not go away. The pain may extend to the buttocks.  Stiffness or limited range of motion.  Sudden muscle tightening (spasms). How is this diagnosed? This condition may be diagnosed based on:  Your symptoms.  Your medical history.  A physical exam.  Imaging tests, such as: ? X-rays. ? MRI. How is this treated? Treatment for this condition may include:  Rest.  Applying heat and cold to the affected area.  Over-the-counter medicines to help relieve pain and inflammation, such as NSAIDs.  Prescription pain medicine and muscle relaxants may be needed for a short time.  Physical therapy. Follow these instructions at home: Managing pain, stiffness, and swelling      If directed, put ice on the injured area during the first 24 hours after your injury. ? Put ice in a plastic bag. ? Place a towel between your skin and the bag. ? Leave the ice on for 20 minutes, 2-3 times a day.  If directed, apply heat to the affected area as often as told by your health care provider. Use the heat source that your health care provider recommends, such as a moist heat pack or a heating pad. ? Place a towel between your skin and the heat source. ? Leave the heat on for 20-30 minutes. ? Remove the heat if your skin turns bright red. This is especially important if  you are unable to feel pain, heat, or cold. You may have a greater risk of getting burned. Activity  Rest and return to your normal activities as told by your health care provider. Ask your health care provider what activities are safe for you.  Do exercises as told by your health care provider. Medicines  Take over-the-counter and prescription medicines only as told by your health care provider.  Ask your health care provider if the medicine prescribed to you: ? Requires you to avoid driving or using heavy machinery. ? Can cause constipation. You may need to take these actions to prevent or treat constipation:  Drink enough fluid to keep your urine pale yellow.  Take over-the-counter or prescription medicines.  Eat foods that are high in fiber, such as beans, whole grains, and fresh fruits and vegetables.  Limit foods that are high  in fat and processed sugars, such as fried or sweet foods. Injury prevention To prevent a future low-back injury:  Always warm up properly before physical activity or sports.  Cool down and stretch after being active.  Use correct form when playing sports and lifting heavy objects. Bend your knees before you lift heavy objects.  Use good posture when sitting and standing.  Stay physically fit and keep a healthy weight. ? Do at least 150 minutes of moderate-intensity exercise each week, such as brisk walking or water aerobics. ? Do strength exercises at least 2 times each week.  General instructions  Do not use any products that contain nicotine or tobacco, such as cigarettes, e-cigarettes, and chewing tobacco. If you need help quitting, ask your health care provider.  Keep all follow-up visits as told by your health care provider. This is important. Contact a health care provider if:  Your back pain does not improve after 6 weeks of treatment.  Your symptoms get worse. Get help right away if:  Your back pain is severe.  You are unable to  stand or walk.  You develop pain in your legs.  You develop weakness in your buttocks or legs.  You have difficulty controlling when you urinate or when you have a bowel movement. ? You have frequent, painful, or bloody urination. ? You have a temperature over 101.67F (38.3C) Summary  A lumbar strain, which is sometimes called a low-back strain, is a stretch or tear in a muscle or the strong cords of tissue that attach muscle to bone (tendons) in the lower back (lumbar spine).  This type of injury occurs when muscles or tendons are torn or are stretched beyond their limits.  Rest and return to your normal activities as told by your health care provider. If directed, apply heat and ice to the affected area as often as told by your health care provider.  Take over-the-counter and prescription medicines only as told by your health care provider.  Contact a health care provider if you have new or worsening symptoms. This information is not intended to replace advice given to you by your health care provider. Make sure you discuss any questions you have with your health care provider. Document Revised: 10/24/2017 Document Reviewed: 10/24/2017 Elsevier Patient Education  [place].     Constipation, Adult Constipation is when a person:  Poops (has a bowel movement) fewer times in a week than normal.  Has a hard time pooping.  Has poop that is dry, hard, or bigger than normal. Follow these instructions at home: Eating and drinking   Eat foods that have a lot of fiber, such as: ? Fresh fruits and vegetables. ? Whole grains. ? Beans.  Eat less of foods that are high in fat, low in fiber, or overly processed, such as: ? [place] fries. ? Hamburgers. ? Cookies. ? Candy. ? Soda.  Drink enough fluid to keep your pee (urine) clear or pale yellow. General instructions  Exercise regularly or as told by your doctor.  Go to the restroom when you feel like you need to  poop. Do not hold it in.  Take over-the-counter and prescription medicines only as told by your doctor. These include any fiber supplements.  Do pelvic floor retraining exercises, such as: ? Doing deep breathing while relaxing your lower belly (abdomen). ? Relaxing your pelvic floor while pooping.  Watch your condition for any changes.  Keep all follow-up visits as told by your doctor. This is  important. Contact a doctor if:  You have pain that gets worse.  You have a fever.  You have not pooped for 4 days.  You throw up (vomit).  You are not hungry.  You lose weight.  You are bleeding from the anus.  You have thin, pencil-like poop (stool). Get help right away if:  You have a fever, and your symptoms suddenly get worse.  You leak poop or have blood in your poop.  Your belly feels hard or bigger than normal (is bloated).  You have very bad belly pain.  You feel dizzy or you faint. This information is not intended to replace advice given to you by your health care provider. Make sure you discuss any questions you have with your health care provider. Document Revised: 12/07/2016 Document Reviewed: 06/15/2015 Elsevier Patient Education  2020 [employer].

## 2019-09-29 NOTE — Progress Notes (Signed)
Patient ID: Barbara Pennington, female    DOB: [birth year], 39 y.o.   MRN: [id number]  PCP: Barbara Sizer, MD  Chief Complaint  Patient presents with  . Back Pain    mainly right side for 1 month  . Constipation    Subjective:   Barbara Pennington is a 39 y.o. female, presents to clinic with CC of the following:  Back Pain This is a new problem. The current episode started more than 1 month ago. Associated symptoms include abdominal pain. Pertinent negatives include no fever or weight loss.  Constipation This is a new problem. The current episode started 1 to 4 weeks ago (2 weeks). Stool description: hard, compact. Eating Fiber: trying to at chia seeds. She exercises regularly (3 x a week). There has been adequate water intake. Associated symptoms include abdominal pain and back pain. Pertinent negatives include no anorexia, bloating, diarrhea, difficulty urinating, fecal incontinence, fever, flatus, melena, nausea, rectal pain or weight loss. Associated symptoms comments: Some BRBPR with constipation and straining. Treatments tried: no daily tx, laxative and some kind of OTC fleet tx. There is no history of abdominal surgery or inflammatory bowel disease.    OTC ibuprofen dosing per bottle/box  (says she took 5 at once)  Tylenol OTC doesn't help Using heating pad - maybe "eases it" a little Muscle relaxers don't help Past back injury in 2019 - did "extensive" PT and she doesn't want to go to PT  For constipation she took mag citrate bottle to have a BM over the weekend. Still some blood on stool in bottom on toilet and on toilet paper, denies hematochezia and melena - GI appt in the next month.       Patient Active Problem List   Diagnosis Date Noted  . PTSD (post-traumatic stress disorder) 01/14/2019  . Borderline personality disorder (Barbara Pennington) 01/14/2019  . Anxiety 01/14/2019  . Mixed hyperlipidemia 01/14/2019  . Morbid obesity with BMI of 50.0-59.9, adult (Barbara Pennington) 01/14/2019  . OSA on  CPAP 01/14/2019  . Asthma 04/30/2018  . Low back pain 08/20/2017  . 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 Mastitis, left, acute 12/20/2016  . Essential hypertension       Current Outpatient Medications:  .  acetaminophen (TYLENOL) 325 MG tablet, Take 650 mg by mouth every 6 (six) hours as needed. , Disp: , Rfl:  .  albuterol (VENTOLIN HFA) 108 (90 Base) MCG/ACT inhaler, Inhale 2 puffs into the lungs every 6 (six) hours as needed for wheezing or shortness of breath., Disp: 18 g, Rfl: 1 .  atorvastatin (LIPITOR) 20 MG tablet, Take 1 tablet (20 mg total) by mouth daily., Disp: 30 tablet, Rfl: 5 .  buPROPion (WELLBUTRIN SR) 150 MG 12 hr tablet, Take 1 tablet (150 mg total) by mouth 2 (two) times daily., Disp: 60 tablet, Rfl: 0 .  cholecalciferol (VITAMIN D3) 25 MCG (1000 UNIT) tablet, Take 1,000 Units by mouth daily., Disp: , Rfl:  .  escitalopram (LEXAPRO) 20 MG tablet, Take 1 tablet (20 mg total) by mouth daily., Disp: 30 tablet, Rfl: 0 .  hydrochlorothiazide (HYDRODIURIL) 25 MG tablet, Take 1 tablet (25 mg total) by mouth daily., Disp: 30 tablet, Rfl: 5 .  hydrOXYzine (ATARAX/VISTARIL) 50 MG tablet, Take 1 tablet (50 mg total) by mouth 2 (two) times daily., Disp: 60 tablet, Rfl: 5 .  modafinil (PROVIGIL) 200 MG tablet, Take 1 tablet (200 mg total) by mouth daily., Disp: 30 tablet, Rfl: 2 .  tiZANidine (ZANAFLEX) 4  MG tablet, Take 1 tablet (4 mg total) by mouth at bedtime as needed for muscle spasms., Disp: 90 tablet, Rfl: 0 .  polyethylene glycol powder (GLYCOLAX/MIRALAX) 17 GM/SCOOP powder, Take 8.5-17 g by mouth daily as needed (constipation)., Disp: 3350 g, Rfl: 1   Allergies  Allergen Reactions  . Latex Rash     Social History   Tobacco Use  . Smoking status: Former Smoker    Packs/day: 0.25    Years: 10.00    Pack years: 2.50    Types: Cigarettes    Quit date: 07/05/2014    Years since quitting: 5.2  . Smokeless tobacco: Never Used  Vaping Use  . Vaping Use: Never  used  Substance Use Topics  . Alcohol use: Yes    Comment: rare  . Drug use: No      Chart Review Today: I personally reviewed active problem list, medication list, allergies, family history, social history, health maintenance, notes from last encounter, lab results, imaging with the patient/caregiver today.   Review of Systems  Constitutional: Negative.  Negative for fever and weight loss.  HENT: Negative.   Eyes: Negative.   Respiratory: Negative.   Cardiovascular: Negative.   Gastrointestinal: Positive for abdominal pain and constipation. Negative for anorexia, bloating, diarrhea, flatus, melena, nausea and rectal pain.  Endocrine: Negative.   Genitourinary: Negative.  Negative for difficulty urinating.  Musculoskeletal: Positive for back pain.  Skin: Negative.   Allergic/Immunologic: Negative.   Neurological: Negative.   Hematological: Negative.   Psychiatric/Behavioral: Negative.   All other systems reviewed and are negative.      Objective:   Vitals:   09/29/19 1422  BP: 124/72  Pulse: 80  Resp: 18  Temp: 98.1 F (36.7 C)  TempSrc: Oral  SpO2: 98%  Weight: (!) 310 lb 14.4 oz (141 kg)  Height: 5\' 5"  (1.651 m)    Body mass index is 51.74 kg/m.  Physical Exam Vitals and nursing note reviewed.  Constitutional:      General: She is not in acute distress.    Appearance: Normal appearance. She is well-developed. She is obese. She is not ill-appearing, toxic-appearing or diaphoretic.     Interventions: Face mask in place.  HENT:     Head: Normocephalic and atraumatic.     Right Ear: External ear normal.     Left Ear: External ear normal.  Eyes:     General: Lids are normal. No scleral icterus.       Right eye: No discharge.        Left eye: No discharge.     Conjunctiva/sclera: Conjunctivae normal.  Neck:     Trachea: Phonation normal. No tracheal deviation.  Cardiovascular:     Rate and Rhythm: Normal rate and regular rhythm.     Pulses: Normal  pulses.          Radial pulses are 2+ on the right side and 2+ on the left side.       Posterior tibial pulses are 2+ on the right side and 2+ on the left side.     Heart sounds: Normal heart sounds. No murmur heard.  No friction rub. No gallop.   Pulmonary:     Effort: Pulmonary effort is normal. No respiratory distress.     Breath sounds: Normal breath sounds. No stridor. No wheezing, rhonchi or rales.  Chest:     Chest wall: No tenderness.  Abdominal:     General: Bowel sounds are normal. There is no distension.  Palpations: Abdomen is soft.     Tenderness: There is no abdominal tenderness. There is no right CVA tenderness, left CVA tenderness, guarding or rebound.  Musculoskeletal:     Cervical back: Normal.     Lumbar back: Tenderness present. No swelling, edema or bony tenderness. Negative right straight leg raise test and negative left straight leg raise test.     Right lower leg: No edema.     Left lower leg: No edema.     Comments: Right paraspinal lumbar ttp  Skin:    General: Skin is warm and dry.     Coloration: Skin is not jaundiced or pale.     Findings: No rash.  Neurological:     Mental Status: She is alert.     Motor: No abnormal muscle tone.     Gait: Gait normal.  Psychiatric:        Mood and Affect: Mood normal.        Speech: Speech normal.        Behavior: Behavior normal.      Results for orders placed or performed during the hospital encounter of 08/06/19  Lipid panel  Result Value Ref Range   Cholesterol 151 0 - 200 mg/dL   Triglycerides 134 <150 mg/dL   HDL 33 (L) >40 mg/dL   Total CHOL/HDL Ratio 4.6 RATIO   VLDL 27 0 - 40 mg/dL   LDL Cholesterol 91 0 - 99 mg/dL  Hemoglobin A1c  Result Value Ref Range   Hgb A1c MFr Bld 5.6 4.8 - 5.6 %   Mean Plasma Glucose 114 mg/dL  CBC with Differential/Platelet  Result Value Ref Range   WBC 6.4 4.0 - 10.5 K/uL   RBC 4.63 3.87 - 5.11 MIL/uL   Hemoglobin 14.2 12.0 - 15.0 g/dL   HCT 40.8 36 - 46 %    MCV 88.1 80.0 - 100.0 fL   MCH 30.7 26.0 - 34.0 pg   MCHC 34.8 30.0 - 36.0 g/dL   RDW 13.0 11.5 - 15.5 %   Platelets 209 150 - 400 K/uL   nRBC 0.0 0.0 - 0.2 %   Neutrophils Relative % 46 %   Neutro Abs 2.9 1.7 - 7.7 K/uL   Lymphocytes Relative 41 %   Lymphs Abs 2.6 0.7 - 4.0 K/uL   Monocytes Relative 7 %   Monocytes Absolute 0.5 0 - 1 K/uL   Eosinophils Relative 5 %   Eosinophils Absolute 0.3 0 - 0 K/uL   Basophils Relative 1 %   Basophils Absolute 0.1 0 - 0 K/uL   Immature Granulocytes 0 %   Abs Immature Granulocytes 0.02 0.00 - 0.07 K/uL  Comprehensive metabolic panel  Result Value Ref Range   Sodium 141 135 - 145 mmol/L   Potassium 4.1 3.5 - 5.1 mmol/L   Chloride 102 98 - 111 mmol/L   CO2 29 22 - 32 mmol/L   Glucose, Bld 105 (H) 70 - 99 mg/dL   BUN 12 6 - 20 mg/dL   Creatinine, Ser 1.41 (H) 0.44 - 1.00 mg/dL   Calcium 9.3 8.9 - 10.3 mg/dL   Total Protein 7.3 6.5 - 8.1 g/dL   Albumin 4.4 3.5 - 5.0 g/dL   AST 12 (L) 15 - 41 U/L   ALT 27 0 - 44 U/L   Alkaline Phosphatase 55 38 - 126 U/L   Total Bilirubin 0.9 0.3 - 1.2 mg/dL   GFR calc non Af Amer 46 (L) >60 mL/min   GFR  calc Af Amer 54 (L) >60 mL/min   Anion gap 10 5 - 15       Assessment & Plan:   1. Chronic bilateral low back pain without sciatica Multiple treatment options discussed with the patient today however she refuses muscle relaxers, says NSAIDs do not work, refuses PT Offered a referral to a spine specialist she also declined this. I explained that the there are no other medications that will solve her problems if she is unwilling to change some things in her diet, lifestyle, become more active or see specialist to help her with her mobility and function I explained that physical therapy would be extremely helpful for her pain and if she has a significant history of back pain then she should consult a spinal specialist Offered Robaxin and Lyrica - DG Lumbar Spine Complete; Future  2. Constipation,  unspecified constipation type Symptoms a chronic problem with poor diet and no attempts to use over-the-counter medications Educated patient on diet, fiber, hydration and over-the-counter medications that would help, offered to do a screening KUB to assess stool burden - polyethylene glycol powder (GLYCOLAX/MIRALAX) 17 GM/SCOOP powder; Take 8.5-17 g by mouth daily as needed (constipation).  Dispense: 3350 g; Refill: 1 - DG Abd 1 View; Future  3. Need for influenza vaccination - Flu Vaccine QUAD 6+ mos PF IM (Fluarix Quad PF)  4. AKI (acute kidney injury) ([place]) Recent labs show decline in her kidney function recheck today with lab work - BASIC METABOLIC PANEL WITH GFR  5. Flank pain Unclear with muscle skeletal we will do a point-of-care urine to rule out UTI/pyelo - POCT urinalysis dipstick  6. Proteinuria, unspecified type Recheck  - Microalbumin, urine       Delsa Grana, PA-C 09/29/19 2:34 PM

## 2019-09-30 ENCOUNTER — Encounter: Payer: Self-pay | Admitting: Family Medicine

## 2019-09-30 LAB — MICROALBUMIN, URINE: Microalb, Ur: 1.4 mg/dL

## 2019-10-08 ENCOUNTER — Encounter: Payer: Self-pay | Admitting: Family Medicine

## 2019-10-20 ENCOUNTER — Other Ambulatory Visit: Payer: Self-pay

## 2019-10-20 ENCOUNTER — Encounter: Payer: Self-pay | Admitting: Gastroenterology

## 2019-10-20 ENCOUNTER — Ambulatory Visit (INDEPENDENT_AMBULATORY_CARE_PROVIDER_SITE_OTHER): Payer: 59 | Admitting: Gastroenterology

## 2019-10-20 VITALS — BP 139/79 | HR 86 | Temp 98.6°F | Ht 65.0 in | Wt 315.0 lb

## 2019-10-20 DIAGNOSIS — K5909 Other constipation: Secondary | ICD-10-CM | POA: Diagnosis not present

## 2019-10-20 DIAGNOSIS — D126 Benign neoplasm of colon, unspecified: Secondary | ICD-10-CM

## 2019-10-20 MED ORDER — NA SULFATE-K SULFATE-MG SULF 17.5-3.13-1.6 GM/177ML PO SOLN
1.0000 | Freq: Once | ORAL | 0 refills | Status: AC
Start: 2019-10-20 — End: 2019-10-20

## 2019-10-20 NOTE — Patient Instructions (Signed)

## 2019-10-20 NOTE — Progress Notes (Signed)
Cephas Darby, MD [address]  [place]  [place], [place] [address]  Main: [phone]  Fax: [phone]    Gastroenterology Consultation  Referring Provider:     Steele Sizer, MD Primary Care Physician:  Steele Sizer, MD Primary Gastroenterologist:  Dr. Cephas Darby Reason for Consultation:     Constipation, rectal bleeding        HPI:   Barbara Pennington is a 39 y.o. female referred by Dr. Steele Sizer, MD  for consultation & management of constipation.  Patient reports 2 months history of severe constipation associated with significant straining, hard stools, abdominal bloating, incomplete emptying.  She has tried magnesium citrate.  She is taking Metamucil with not much relief.  She also notices scant blood on surface of the stool.  She underwent hemorrhoid ligation by me in 2018 for rectal bleeding.  Her rectal bleeding significantly improved.  She reports that she has been going through significant financial stress lately.  She reports abdominal cramps with bowel movement.  She denies any weight loss.  She found out about decreased renal function and is trying to make healthy changes in her diet.  She does not smoke or drink alcohol She denies carbonated beverages  NSAIDs: None  Antiplts/Anticoagulants/Anti thrombotics: None  GI Procedures: Colonoscopy in 2018, serrated adenoma and tubular adenoma of the colon, due for surveillance colonoscopy in 2021  Past Medical History:  Diagnosis Date  . Anxiety   . Arthritis 2006   rhuematoid - mild - no current issues  . Asthma 2003  . Complication of anesthesia    ran fever after c-section - had infection.  "Usually" has low temp after surgery.  . Hypertension 2013  . Lump or mass in breast 2013   RIGHT BREAST  . Motion sickness    repeated amusement park rides  . Scoliosis    no current issues  . Shortness of breath dyspnea    secondary to weight   . Ulcer 2001    Past Surgical History:  Procedure  Laterality Date  . ABDOMINAL HYSTERECTOMY  2008  . BREAST BIOPSY Left 2013   CORE - FIBROADENOMA VS PHYLLODES  . CESAREAN SECTION  2007  . COLONOSCOPY WITH PROPOFOL N/A 10/09/2016   Procedure: COLONOSCOPY WITH PROPOFOL;  Surgeon: Jonathon Bellows, MD;  Location: [facility] ENDOSCOPY;  Service: Gastroenterology;  Laterality: N/A;  . GUM SURGERY  2004  . PLANTAR FASCIA RELEASE Left 08/19/2014   Procedure: ENDOSCOPIC PLANTAR FASCIOTOMY RELEASE L WITH TOPAZ;  Surgeon: Albertine Patricia, DPM;  Location: [facility];  Service: Podiatry;  Laterality: Left;  LMA WITH POPLITEAL BLOCK  . TONSILLECTOMY  1984    Current Outpatient Medications:  .  acetaminophen (TYLENOL) 325 MG tablet, Take 650 mg by mouth every 6 (six) hours as needed. , Disp: , Rfl:  .  albuterol (VENTOLIN HFA) 108 (90 Base) MCG/ACT inhaler, Inhale 2 puffs into the lungs every 6 (six) hours as needed for wheezing or shortness of breath., Disp: 18 g, Rfl: 1 .  atorvastatin (LIPITOR) 20 MG tablet, Take 1 tablet (20 mg total) by mouth daily., Disp: 30 tablet, Rfl: 5 .  buPROPion (WELLBUTRIN SR) 150 MG 12 hr tablet, Take 1 tablet (150 mg total) by mouth 2 (two) times daily., Disp: 60 tablet, Rfl: 0 .  cholecalciferol (VITAMIN D3) 25 MCG (1000 UNIT) tablet, Take 1,000 Units by mouth daily., Disp: , Rfl:  .  escitalopram (LEXAPRO) 20 MG tablet, Take 1 tablet (20 mg total) by  mouth daily., Disp: 30 tablet, Rfl: 0 .  hydrochlorothiazide (HYDRODIURIL) 25 MG tablet, Take 1 tablet (25 mg total) by mouth daily., Disp: 30 tablet, Rfl: 5 .  hydrOXYzine (ATARAX/VISTARIL) 50 MG tablet, Take 1 tablet (50 mg total) by mouth 2 (two) times daily., Disp: 60 tablet, Rfl: 5 .  methocarbamol (ROBAXIN) 500 MG tablet, Take 1 tablet (500 mg total) by mouth every 8 (eight) hours as needed for muscle spasms., Disp: 60 tablet, Rfl: 1 .  modafinil (PROVIGIL) 200 MG tablet, Take 1 tablet (200 mg total) by mouth daily., Disp: 30 tablet, Rfl: 2 .  polyethylene glycol powder  (GLYCOLAX/MIRALAX) 17 GM/SCOOP powder, Take 8.5-17 g by mouth daily as needed (constipation)., Disp: 3350 g, Rfl: 1   Family History  Problem Relation Age of Onset  . Testicular cancer Brother   . Colon cancer Paternal Grandfather   . Breast cancer Maternal Grandmother   . Hypertension Maternal Grandmother   . Aneurysm Maternal Grandmother   . Stroke Maternal Grandmother   . Obesity Mother   . Diabetes Mother   . High Cholesterol Mother   . Hypertension Mother   . Neuropathy Mother   . Depression Mother   . Anxiety disorder Mother   . Obesity Father   . Hypertension Father   . High Cholesterol Father   . Depression Father   . Anxiety disorder Father   . Hypertension Maternal Grandfather   . High Cholesterol Maternal Grandfather   . Heart disease Maternal Grandfather        has a pacemaker  . Stroke Maternal Grandfather   . Alzheimer's disease Paternal Grandmother   . Dementia Paternal Grandmother      Social History   Tobacco Use  . Smoking status: Former Smoker    Packs/day: 0.25    Years: 10.00    Pack years: 2.50    Types: Cigarettes    Quit date: 07/05/2014    Years since quitting: 5.2  . Smokeless tobacco: Never Used  Vaping Use  . Vaping Use: Never used  Substance Use Topics  . Alcohol use: Yes    Comment: rare  . Drug use: No    Allergies as of 10/20/2019 - Review Complete 10/20/2019  Allergen Reaction Noted  . Latex Rash 08/13/2014    Review of Systems:    All systems reviewed and negative except where noted in HPI.   Physical Exam:  BP 139/79   Pulse 86   Temp 98.6 F (37 C)   Ht 5\' 5"  (1.651 m)   Wt (!) 315 lb (142.9 kg)   BMI 52.42 kg/m  No LMP recorded. Patient has had a hysterectomy.  General:   Alert,  Well-developed, well-nourished, pleasant and cooperative in NAD Head:  Normocephalic and atraumatic. Eyes:  Sclera clear, no icterus.   Conjunctiva pink. Ears:  Normal auditory acuity. Nose:  No deformity, discharge, or  lesions. Mouth:  No deformity or lesions,oropharynx pink & moist. Neck:  Supple; no masses or thyromegaly. Lungs:  Respirations even and unlabored.  Clear throughout to auscultation.   No wheezes, crackles, or rhonchi. No acute distress. Heart:  Regular rate and rhythm; no murmurs, clicks, rubs, or gallops. Abdomen:  Normal bowel sounds. Soft, non-tender and non-distended without masses, hepatosplenomegaly or hernias noted.  No guarding or rebound tenderness.   Rectal: Not performed Msk:  Symmetrical without gross deformities. Good, equal movement & strength bilaterally. Pulses:  Normal pulses noted. Extremities:  No clubbing or edema.  No cyanosis. Neurologic:  Alert and oriented x3;  grossly normal neurologically. Skin:  Intact without significant lesions or rashes. No jaundice. Psych:  Alert and cooperative. Normal mood and affect.  Imaging Studies: Reviewed  Assessment and Plan:   Barbara Pennington is a 39 y.o. Caucasian female with BMI 52.4, history of rectal bleeding secondary to hemorrhoids s/p hemorrhoid ligation is seen in consultation for constipation.  TSH normal in 2020.  No evidence of anemia  Chronic constipation Discussed about high-fiber diet, information provided Bottle of magnesium citrate Linzess 290 MCG daily, samples provided Adequate intake of water  Personal history of adenomas of the colon Recommend surveillance colonoscopy   Follow up as needed   Cephas Darby, MD

## 2019-10-21 ENCOUNTER — Telehealth: Payer: Self-pay

## 2019-10-21 MED ORDER — PEG 3350-KCL-NA BICARB-NACL 420 G PO SOLR: 4000.0000 mL | Freq: Once | ORAL | 0 refills | Status: AC

## 2019-10-21 NOTE — Telephone Encounter (Signed)
Patient states that the prep is 114 dollars. Patient wants something cheaper called in for her. Sent alternative to the pharmacy

## 2019-10-26 ENCOUNTER — Other Ambulatory Visit: Payer: Self-pay

## 2019-10-26 DIAGNOSIS — J4520 Mild intermittent asthma, uncomplicated: Secondary | ICD-10-CM

## 2019-10-26 MED ORDER — ALBUTEROL SULFATE HFA 108 (90 BASE) MCG/ACT IN AERS: 2.0000 | INHALATION_SPRAY | Freq: Four times a day (QID) | RESPIRATORY_TRACT | 0 refills | Status: DC | PRN

## 2019-11-05 ENCOUNTER — Other Ambulatory Visit
Admission: RE | Admit: 2019-11-05 | Discharge: 2019-11-05 | Disposition: A | Discharge status: Home / Self Care | Payer: 59 | Source: Ambulatory Visit | Attending: Gastroenterology | Admitting: Gastroenterology

## 2019-11-05 ENCOUNTER — Encounter (INDEPENDENT_AMBULATORY_CARE_PROVIDER_SITE_OTHER): Payer: Self-pay

## 2019-11-05 ENCOUNTER — Other Ambulatory Visit: Payer: Self-pay

## 2019-11-05 DIAGNOSIS — Z20822 Contact with and (suspected) exposure to covid-19: Secondary | ICD-10-CM | POA: Diagnosis not present

## 2019-11-05 DIAGNOSIS — Z01812 Encounter for preprocedural laboratory examination: Secondary | ICD-10-CM | POA: Insufficient documentation

## 2019-11-06 ENCOUNTER — Encounter: Payer: Self-pay | Admitting: Gastroenterology

## 2019-11-06 LAB — SARS CORONAVIRUS 2 (TAT 6-24 HRS): SARS Coronavirus 2: NEGATIVE

## 2019-11-09 ENCOUNTER — Ambulatory Visit
Admission: RE | Admit: 2019-11-09 | Discharge: 2019-11-09 | Disposition: A | Discharge status: Home / Self Care | Payer: 59 | Attending: Gastroenterology | Admitting: Gastroenterology

## 2019-11-09 ENCOUNTER — Encounter
Admission: RE | Disposition: A | Discharge status: Home / Self Care | Payer: Self-pay | Source: Home / Self Care | Attending: Gastroenterology

## 2019-11-09 ENCOUNTER — Ambulatory Visit: Payer: 59 | Admitting: Registered Nurse

## 2019-11-09 ENCOUNTER — Encounter: Payer: Self-pay | Admitting: Gastroenterology

## 2019-11-09 DIAGNOSIS — Z1211 Encounter for screening for malignant neoplasm of colon: Secondary | ICD-10-CM | POA: Diagnosis present

## 2019-11-09 DIAGNOSIS — Z820 Family history of epilepsy and other diseases of the nervous system: Secondary | ICD-10-CM | POA: Insufficient documentation

## 2019-11-09 DIAGNOSIS — Z800 Family history of malignant neoplasm of digestive organs: Secondary | ICD-10-CM | POA: Insufficient documentation

## 2019-11-09 DIAGNOSIS — Z8249 Family history of ischemic heart disease and other diseases of the circulatory system: Secondary | ICD-10-CM | POA: Insufficient documentation

## 2019-11-09 DIAGNOSIS — Z90710 Acquired absence of both cervix and uterus: Secondary | ICD-10-CM | POA: Insufficient documentation

## 2019-11-09 DIAGNOSIS — Z86010 Personal history of colonic polyps: Secondary | ICD-10-CM | POA: Diagnosis not present

## 2019-11-09 DIAGNOSIS — Z803 Family history of malignant neoplasm of breast: Secondary | ICD-10-CM | POA: Diagnosis not present

## 2019-11-09 DIAGNOSIS — Z7951 Long term (current) use of inhaled steroids: Secondary | ICD-10-CM | POA: Insufficient documentation

## 2019-11-09 DIAGNOSIS — Z823 Family history of stroke: Secondary | ICD-10-CM | POA: Insufficient documentation

## 2019-11-09 DIAGNOSIS — Z79899 Other long term (current) drug therapy: Secondary | ICD-10-CM | POA: Diagnosis not present

## 2019-11-09 DIAGNOSIS — Z818 Family history of other mental and behavioral disorders: Secondary | ICD-10-CM | POA: Diagnosis not present

## 2019-11-09 DIAGNOSIS — M069 Rheumatoid arthritis, unspecified: Secondary | ICD-10-CM | POA: Diagnosis not present

## 2019-11-09 DIAGNOSIS — K644 Residual hemorrhoidal skin tags: Secondary | ICD-10-CM | POA: Insufficient documentation

## 2019-11-09 DIAGNOSIS — I10 Essential (primary) hypertension: Secondary | ICD-10-CM | POA: Insufficient documentation

## 2019-11-09 DIAGNOSIS — D123 Benign neoplasm of transverse colon: Secondary | ICD-10-CM | POA: Diagnosis not present

## 2019-11-09 DIAGNOSIS — Z87891 Personal history of nicotine dependence: Secondary | ICD-10-CM | POA: Insufficient documentation

## 2019-11-09 DIAGNOSIS — J45909 Unspecified asthma, uncomplicated: Secondary | ICD-10-CM | POA: Insufficient documentation

## 2019-11-09 DIAGNOSIS — D126 Benign neoplasm of colon, unspecified: Secondary | ICD-10-CM

## 2019-11-09 DIAGNOSIS — Z833 Family history of diabetes mellitus: Secondary | ICD-10-CM | POA: Diagnosis not present

## 2019-11-09 DIAGNOSIS — Z8043 Family history of malignant neoplasm of testis: Secondary | ICD-10-CM | POA: Diagnosis not present

## 2019-11-09 DIAGNOSIS — Z8349 Family history of other endocrine, nutritional and metabolic diseases: Secondary | ICD-10-CM | POA: Insufficient documentation

## 2019-11-09 DIAGNOSIS — Z91040 Latex allergy status: Secondary | ICD-10-CM | POA: Insufficient documentation

## 2019-11-09 HISTORY — PX: COLONOSCOPY WITH PROPOFOL: SHX5780

## 2019-11-09 SURGERY — COLONOSCOPY WITH PROPOFOL
Anesthesia: General

## 2019-11-09 MED ORDER — PROPOFOL 500 MG/50ML IV EMUL
INTRAVENOUS | Status: DC | PRN
  Administered 2019-11-09: 140 ug/kg/min via INTRAVENOUS

## 2019-11-09 MED ORDER — PROPOFOL 10 MG/ML IV BOLUS
INTRAVENOUS | Status: DC | PRN
  Administered 2019-11-09: 80 mg via INTRAVENOUS
  Administered 2019-11-09: 40 mg via INTRAVENOUS

## 2019-11-09 MED ORDER — SODIUM CHLORIDE 0.9 % IV SOLN
INTRAVENOUS | Status: DC
  Administered 2019-11-09: 20 mL/h via INTRAVENOUS

## 2019-11-09 NOTE — Anesthesia Postprocedure Evaluation (Signed)
Anesthesia Post Note  Patient: Barbara Pennington  Procedure(s) Performed: COLONOSCOPY WITH PROPOFOL (N/A )  Patient location during evaluation: Endoscopy Anesthesia Type: General Level of consciousness: awake and alert Pain management: pain level controlled Vital Signs Assessment: post-procedure vital signs reviewed and stable Respiratory status: spontaneous breathing, nonlabored ventilation, respiratory function stable and patient connected to nasal cannula oxygen Cardiovascular status: blood pressure returned to baseline and stable Postop Assessment: no apparent nausea or vomiting Anesthetic complications: no   No complications documented.   Last Vitals:  Vitals:   11/09/19 1028 11/09/19 1038  BP: 126/86 (!) 136/96  Pulse: (!) 58 70  Resp: (!) 25 14  Temp:    SpO2: 97% 99%    Last Pain:  Vitals:   11/09/19 1038  TempSrc:   PainSc: 0-No pain                 Martha Clan

## 2019-11-09 NOTE — Transfer of Care (Signed)
Immediate Anesthesia Transfer of Care Note  Patient: Barbara Pennington  Procedure(s) Performed: COLONOSCOPY WITH PROPOFOL (N/A )  Patient Location: PACU  Anesthesia Type:General  Level of Consciousness: sedated  Airway & Oxygen Therapy: Patient Spontanous Breathing and Patient connected to nasal cannula oxygen  Post-op Assessment: Report given to RN and Post -op Vital signs reviewed and stable  Post vital signs: Reviewed and stable  Last Vitals:  Vitals Value Taken Time  BP    Temp    Pulse 73 11/09/19 1008  Resp 19 11/09/19 1008  SpO2 93 % 11/09/19 1008  Vitals shown include unvalidated device data.  Last Pain:  Vitals:   11/09/19 0850  TempSrc: Temporal  PainSc: 2          Complications: No complications documented.

## 2019-11-09 NOTE — Op Note (Signed)
[facility] Gastroenterology Patient Name: Barbara Pennington Procedure Date: 11/09/2019 9:25 AM MRN: [id number] Account #: [id number] Date of Birth: 1980-08-23 Admit Type: Outpatient Age: 39 Room: [facility] ENDO ROOM 1 Gender: Female Note Status: Finalized Procedure:             Colonoscopy Indications:           Surveillance: Personal history of adenomatous polyps                         on last colonoscopy 3 years ago, Last colonoscopy:                         October 2018 Providers:             Lin Landsman MD, MD Medicines:             General Anesthesia Complications:         No immediate complications. Estimated blood loss: None. Procedure:             Pre-Anesthesia Assessment:                        - Prior to the procedure, a History and Physical was                         performed, and patient medications and allergies were                         reviewed. The patient is competent. The risks and                         benefits of the procedure and the sedation options and                         risks were discussed with the patient. All questions                         were answered and informed consent was obtained.                         Patient identification and proposed procedure were                         verified by the physician, the nurse, the                         anesthesiologist, the anesthetist and the technician                         in the pre-procedure area in the procedure room in the                         endoscopy suite. Mental Status Examination: alert and                         oriented. Airway Examination: normal oropharyngeal                         airway and neck mobility. Respiratory Examination:  clear to auscultation. CV Examination: normal.                         Prophylactic Antibiotics: The patient does not require                         prophylactic antibiotics. Prior Anticoagulants: The                          patient has taken no previous anticoagulant or                         antiplatelet agents. ASA Grade Assessment: II - A                         patient with mild systemic disease. After reviewing                         the risks and benefits, the patient was deemed in                         satisfactory condition to undergo the procedure. The                         anesthesia plan was to use general anesthesia.                         Immediately prior to administration of medications,                         the patient was re-assessed for adequacy to receive                         sedatives. The heart rate, respiratory rate, oxygen                         saturations, blood pressure, adequacy of pulmonary                         ventilation, and response to care were monitored                         throughout the procedure. The physical status of the                         patient was re-assessed after the procedure.                        After obtaining informed consent, the colonoscope was                         passed under direct vision. Throughout the procedure,                         the patient's blood pressure, pulse, and oxygen                         saturations were monitored continuously. The  Colonoscope was introduced through the anus and                         advanced to the the cecum, identified by appendiceal                         orifice and ileocecal valve. The colonoscopy was                         performed without difficulty. The patient tolerated                         the procedure well. The quality of the bowel                         preparation was evaluated using the BBPS [facility] Bowel                         Preparation Scale) with scores of: Right Colon = 3,                         Transverse Colon = 3 and Left Colon = 3 (entire mucosa                         seen well with no residual staining,  small fragments                         of stool or opaque liquid). The total BBPS score                         equals 9. Findings:      A 5 mm polyp was found in the transverse colon. The polyp was sessile.       The polyp was removed with a cold snare. Resection and retrieval were       complete.      Non-bleeding external hemorrhoids were found during retroflexion. The       hemorrhoids were medium-sized.      Skin tags were found on perianal exam. Impression:            - One 5 mm polyp in the transverse colon, removed with                         a cold snare. Resected and retrieved.                        - Non-bleeding external hemorrhoids.                        - Perianal skin tags found on perianal exam. Recommendation:        - Discharge patient to home (with escort).                        - Resume previous diet today.                        - Continue present medications.                        -  Await pathology results.                        - Repeat colonoscopy in 5 years for surveillance. Procedure Code(s):     --- Professional ---                        [phone], Colonoscopy, flexible; with removal of                         tumor(s), polyp(s), or other lesion(s) by snare                         technique Diagnosis Code(s):     --- Professional ---                        K64.4, Residual hemorrhoidal skin tags                        Z86.010, Personal history of colonic polyps                        K63.5, Polyp of colon CPT copyright 2019 American Medical Association. All rights reserved. The codes documented in this report are preliminary and upon coder review may  be revised to meet current compliance requirements. Dr. Ulyess Mort Lin Landsman MD, MD 11/09/2019 10:05:51 AM This report has been signed electronically. Number of Addenda: 0 Note Initiated On: 11/09/2019 9:25 AM Scope Withdrawal Time: 0 hours 10 minutes 55 seconds  Total Procedure Duration: 0  hours 13 minutes 16 seconds  Estimated Blood Loss:  Estimated blood loss: none.      [facility]

## 2019-11-09 NOTE — H&P (Signed)
Barbara Darby, MD [address]  [place]  [place], [place] [address]  Main: [phone]  Fax: [phone] Pager: [phone]  Primary Care Physician:  Steele Sizer, MD Primary Gastroenterologist:  Dr. Cephas Pennington  Pre-Procedure History & Physical: HPI:  Barbara Pennington is a 39 y.o. female is here for an colonoscopy.   Past Medical History:  Diagnosis Date  . Anxiety   . Arthritis 2006   rhuematoid - mild - no current issues  . Asthma 2003  . Complication of anesthesia    ran fever after c-section - had infection.  "Usually" has low temp after surgery.  . Hypertension 2013  . Lump or mass in breast 2013   RIGHT BREAST  . Motion sickness    repeated amusement park rides  . Scoliosis    no current issues  . Shortness of breath dyspnea    secondary to weight   . Ulcer 2001    Past Surgical History:  Procedure Laterality Date  . ABDOMINAL HYSTERECTOMY  2008  . BREAST BIOPSY Left 2013   CORE - FIBROADENOMA VS PHYLLODES  . CESAREAN SECTION  2007  . COLONOSCOPY WITH PROPOFOL N/A 10/09/2016   Procedure: COLONOSCOPY WITH PROPOFOL;  Surgeon: Jonathon Bellows, MD;  Location: [facility] ENDOSCOPY;  Service: Gastroenterology;  Laterality: N/A;  . GUM SURGERY  2004  . PLANTAR FASCIA RELEASE Left 08/19/2014   Procedure: ENDOSCOPIC PLANTAR FASCIOTOMY RELEASE L WITH TOPAZ;  Surgeon: Albertine Patricia, DPM;  Location: [facility];  Service: Podiatry;  Laterality: Left;  LMA WITH POPLITEAL BLOCK  . TONSILLECTOMY  1984    Prior to Admission medications   Medication Sig Start Date End Date Taking? Authorizing Provider  acetaminophen (TYLENOL) 325 MG tablet Take 650 mg by mouth every 6 (six) hours as needed.    Yes [provider]  albuterol (VENTOLIN HFA) 108 (90 Base) MCG/ACT inhaler Inhale 2 puffs into the lungs every 6 (six) hours as needed for wheezing or shortness of breath. 10/26/19  Yes Sowles, Drue Stager, MD  atorvastatin (LIPITOR) 20 MG tablet Take 1 tablet (20 mg  total) by mouth daily. 08/06/19  Yes Sowles, Drue Stager, MD  buPROPion [facility] SR) 150 MG 12 hr tablet Take 1 tablet (150 mg total) by mouth 2 (two) times daily. 08/06/19  Yes Sowles, Drue Stager, MD  cholecalciferol (VITAMIN D3) 25 MCG (1000 UNIT) tablet Take 1,000 Units by mouth daily.   Yes [provider]  escitalopram (LEXAPRO) 20 MG tablet Take 1 tablet (20 mg total) by mouth daily. 08/06/19  Yes Sowles, Drue Stager, MD  hydrochlorothiazide (HYDRODIURIL) 25 MG tablet Take 1 tablet (25 mg total) by mouth daily. 08/06/19  Yes Sowles, Drue Stager, MD  hydrOXYzine (ATARAX/VISTARIL) 50 MG tablet Take 1 tablet (50 mg total) by mouth 2 (two) times daily. 08/06/19  Yes Sowles, Drue Stager, MD  methocarbamol (ROBAXIN) 500 MG tablet Take 1 tablet (500 mg total) by mouth every 8 (eight) hours as needed for muscle spasms. 09/29/19  Yes Delsa Grana, PA-C  modafinil (PROVIGIL) 200 MG tablet Take 1 tablet (200 mg total) by mouth daily. 08/10/19  Yes Sowles, Drue Stager, MD  polyethylene glycol powder (GLYCOLAX/MIRALAX) 17 GM/SCOOP powder Take 8.5-17 g by mouth daily as needed (constipation). 09/29/19  Yes Delsa Grana, PA-C    Allergies as of 10/21/2019 - Review Complete 10/20/2019  Allergen Reaction Noted  . Latex Rash 08/13/2014    Family History  Problem Relation Age of Onset  . Testicular cancer Brother   . Colon cancer Paternal  Grandfather   . Breast cancer Maternal Grandmother   . Hypertension Maternal Grandmother   . Aneurysm Maternal Grandmother   . Stroke Maternal Grandmother   . Obesity Mother   . Diabetes Mother   . High Cholesterol Mother   . Hypertension Mother   . Neuropathy Mother   . Depression Mother   . Anxiety disorder Mother   . Obesity Father   . Hypertension Father   . High Cholesterol Father   . Depression Father   . Anxiety disorder Father   . Hypertension Maternal Grandfather   . High Cholesterol Maternal Grandfather   . Heart disease Maternal Grandfather        has a pacemaker    . Stroke Maternal Grandfather   . Alzheimer's disease Paternal Grandmother   . Dementia Paternal Grandmother     Social History   Socioeconomic History  . Marital status: Divorced    Spouse name: Not on file  . Number of children: 2  . Years of education: 29  . Highest education level: Bachelor's degree (e.g., BA, AB, BS)  Occupational History  . Not on file  Tobacco Use  . Smoking status: Former Smoker    Packs/day: 0.25    Years: 10.00    Pack years: 2.50    Types: Cigarettes    Quit date: 07/05/2014    Years since quitting: 5.3  . Smokeless tobacco: Never Used  Vaping Use  . Vaping Use: Never used  Substance and Sexual Activity  . Alcohol use: Yes    Comment: rare  . Drug use: No  . Sexual activity: Not Currently    Partners: Male    Birth control/protection: Surgical  Other Topics Concern  . Not on file  Social History Narrative  . Not on file   Social Determinants of Health   Financial Resource Strain:   . Difficulty of Paying Living Expenses: Not on file  Food Insecurity:   . Worried About Charity fundraiser in the Last Year: Not on file  . Ran Out of Food in the Last Year: Not on file  Transportation Needs:   . Lack of Transportation (Medical): Not on file  . Lack of Transportation (Non-Medical): Not on file  Physical Activity:   . Days of Exercise per Week: Not on file  . Minutes of Exercise per Session: Not on file  Stress:   . Feeling of Stress : Not on file  Social Connections:   . Frequency of Communication with Friends and Family: Not on file  . Frequency of Social Gatherings with Friends and Family: Not on file  . Attends Religious Services: Not on file  . Active Member of Clubs or Organizations: Not on file  . Attends Archivist Meetings: Not on file  . Marital Status: Not on file  Intimate Partner Violence:   . Fear of Current or Ex-Partner: Not on file  . Emotionally Abused: Not on file  . Physically Abused: Not on file  .  Sexually Abused: Not on file    Review of Systems: See HPI, otherwise negative ROS  Physical Exam: Pulse 65   Temp (!) 96.7 F (35.9 C) (Temporal)   Resp 20   Ht 5' 4.5" (1.638 m)   Wt (!) 140.2 kg   SpO2 98%   BMI 52.22 kg/m  General:   Alert,  pleasant and cooperative in NAD Head:  Normocephalic and atraumatic. Neck:  Supple; no masses or thyromegaly. Lungs:  Clear throughout to  auscultation.    Heart:  Regular rate and rhythm. Abdomen:  Soft, nontender and nondistended. Normal bowel sounds, without guarding, and without rebound.   Neurologic:  Alert and  oriented x4;  grossly normal neurologically.  Impression/Plan: Barbara Pennington is here for an colonoscopy to be performed for Personal history of adenomas of the colon  Risks, benefits, limitations, and alternatives regarding  colonoscopy have been reviewed with the patient.  Questions have been answered.  All parties agreeable.   Sherri Sear, MD  11/09/2019, 8:56 AM

## 2019-11-09 NOTE — Anesthesia Preprocedure Evaluation (Signed)
Anesthesia Evaluation  Patient identified by MRN, date of birth, ID band Patient awake    Reviewed: Allergy & Precautions, NPO status , Patient's Chart, lab work & pertinent test results  History of Anesthesia Complications (+) history of anesthetic complications  Airway Mallampati: II       Dental  (+) Teeth Intact, Dental Advidsory Given   Pulmonary neg shortness of breath, asthma , sleep apnea and Continuous Positive Airway Pressure Ventilation , neg COPD, neg recent URI, former smoker,     + decreased breath sounds      Cardiovascular hypertension, Pt. on medications (-) angina(-) Past MI and (-) Cardiac Stents (-) dysrhythmias (-) Valvular Problems/Murmurs Rhythm:Regular Rate:Normal     Neuro/Psych PSYCHIATRIC DISORDERS Anxiety negative neurological ROS     GI/Hepatic negative GI ROS, Neg liver ROS,   Endo/Other  neg diabetesMorbid obesity  Renal/GU CRFRenal disease  negative genitourinary   Musculoskeletal negative musculoskeletal ROS (+)   Abdominal (+) + obese,   Peds negative pediatric ROS (+)  Hematology   Anesthesia Other Findings Past Medical History: No date: Anxiety 2006: Arthritis     Comment:  rhuematoid - mild - no current issues 2003: Asthma No date: Complication of anesthesia     Comment:  ran fever after c-section - had infection.  "Usually"               has low temp after surgery. 2013: Hypertension 2013: Lump or mass in breast     Comment:  RIGHT BREAST No date: Motion sickness     Comment:  repeated amusement park rides No date: Scoliosis     Comment:  no current issues No date: Shortness of breath dyspnea     Comment:  secondary to weight  2001: Ulcer   Reproductive/Obstetrics                             Anesthesia Physical  Anesthesia Plan  ASA: II  Anesthesia Plan: General   Post-op Pain Management:    Induction: Intravenous  PONV Risk Score  and Plan: 3 and Propofol infusion and TIVA  Airway Management Planned: Natural Airway, Nasal Cannula and Nasal CPAP  Additional Equipment:   Intra-op Plan:   Post-operative Plan:   Informed Consent: I have reviewed the patients History and Physical, chart, labs and discussed the procedure including the risks, benefits and alternatives for the proposed anesthesia with the patient or authorized representative who has indicated his/her understanding and acceptance.       Plan Discussed with: Surgeon  Anesthesia Plan Comments:         Anesthesia Quick Evaluation

## 2019-11-10 ENCOUNTER — Encounter: Payer: Self-pay | Admitting: Gastroenterology

## 2019-11-10 ENCOUNTER — Other Ambulatory Visit: Payer: Self-pay | Admitting: Gastroenterology

## 2019-11-10 DIAGNOSIS — K5904 Chronic idiopathic constipation: Secondary | ICD-10-CM

## 2019-11-10 LAB — SURGICAL PATHOLOGY

## 2019-11-10 MED ORDER — LINACLOTIDE 290 MCG PO CAPS: 290.0000 ug | ORAL_CAPSULE | Freq: Every day | ORAL | 0 refills | Status: DC

## 2019-11-24 ENCOUNTER — Encounter: Payer: Self-pay | Admitting: Internal Medicine

## 2019-11-24 ENCOUNTER — Ambulatory Visit
Admission: RE | Admit: 2019-11-24 | Discharge: 2019-11-24 | Disposition: A | Discharge status: Home / Self Care | Payer: 59 | Attending: Internal Medicine | Admitting: Internal Medicine

## 2019-11-24 ENCOUNTER — Ambulatory Visit: Payer: 59 | Admitting: Internal Medicine

## 2019-11-24 ENCOUNTER — Ambulatory Visit
Admission: RE | Admit: 2019-11-24 | Discharge: 2019-11-24 | Disposition: A | Discharge status: Home / Self Care | Payer: 59 | Source: Ambulatory Visit | Attending: Internal Medicine | Admitting: Internal Medicine

## 2019-11-24 ENCOUNTER — Telehealth: Payer: Self-pay

## 2019-11-24 ENCOUNTER — Other Ambulatory Visit: Payer: Self-pay

## 2019-11-24 VITALS — BP 110/80 | HR 86 | Temp 98.9°F | Resp 16 | Ht 65.0 in | Wt 310.8 lb

## 2019-11-24 DIAGNOSIS — M546 Pain in thoracic spine: Secondary | ICD-10-CM

## 2019-11-24 DIAGNOSIS — Z6841 Body Mass Index (BMI) 40.0 and over, adult: Secondary | ICD-10-CM

## 2019-11-24 DIAGNOSIS — R519 Headache, unspecified: Secondary | ICD-10-CM | POA: Diagnosis not present

## 2019-11-24 DIAGNOSIS — K5900 Constipation, unspecified: Secondary | ICD-10-CM

## 2019-11-24 DIAGNOSIS — R109 Unspecified abdominal pain: Secondary | ICD-10-CM

## 2019-11-24 LAB — POCT URINALYSIS DIPSTICK
Appearance: NORMAL
Bilirubin, UA: NEGATIVE
Blood, UA: POSITIVE
Glucose, UA: NEGATIVE
Ketones, UA: NEGATIVE
Leukocytes, UA: NEGATIVE
Nitrite, UA: NEGATIVE
Odor: NORMAL
Protein, UA: NEGATIVE
Spec Grav, UA: 1.025 (ref 1.010–1.025)
Urobilinogen, UA: 0.2 E.U./dL
pH, UA: 6 (ref 5.0–8.0)

## 2019-11-24 MED ORDER — NAPROXEN SODIUM 550 MG PO TABS: 550.0000 mg | ORAL_TABLET | Freq: Two times a day (BID) | ORAL | 1 refills | Status: DC

## 2019-11-24 NOTE — Telephone Encounter (Signed)
Copied from [place] [phone]. Topic: General - Other >> Nov 24, 2019 12:27 PM Celene Kras wrote: Reason for CRM: Pt called stating that she received a call from the office. She states that she has not received a VM and is requesting a call back with a VM. Please advise. >> Nov 24, 2019  2:59 PM Karene Fry wrote: Donella Stade, did you call this patient? She is Dr Ancil Boozer pt but Dr Roxan Hockey saw her today? I not seeing any notes on her at all

## 2019-11-24 NOTE — Patient Instructions (Signed)
Can use the naproxen product prescribed to the pharmacy up to twice daily as needed for discomfort, take with food.  Also, please obtain the chest x-ray ordered today.  Also can continue the muscle relaxer you have as needed, take mainly at bedtime as may make drowsy  Local measures also recommended including a contrast therapy approach, which includes applying warmth, followed by range of motion activities, followed by cold.  Relative rest also recommended.

## 2019-11-24 NOTE — Telephone Encounter (Signed)
Thank you :)

## 2019-11-24 NOTE — Progress Notes (Signed)
Patient ID: Barbara Pennington, female    DOB: [birth year], 39 y.o.   MRN: [id number]  PCP: Steele Sizer, MD  Chief Complaint  Patient presents with  . Flank Pain    right  . Headache    Subjective:   Barbara Pennington is a 39 y.o. female, presents to clinic with CC of the following:  Chief Complaint  Patient presents with  . Flank Pain    right  . Headache    HPI:  Patient is a 39 year old female patient of Dr. Ancil Boozer Last visit at [facility] was with Delsa Grana on 09/30/2019 for right-sided back pain for a month.  At that visit, she refused muscle relaxers, noted NSAIDs did not work, and also refused PT.  She also declined a referral to a spine specialist.  An x-ray of the lumbar spine was ordered although not obtained.  She did have an x-ray of the lumbar spine in July 2019 which showed no advanced or likely significant finding, with mild lower lumbar facet arthritis noted She also was noted to have constipation issues on that last visit with Leisa felt to be more chronic, with education on diet, fiber, hydration, and over-the-counter medications noted and a MiraLAX product prescribed She did have a colonoscopy done 11/09/2019 with Linzess started after that procedure for chronic idiopathic constipation per gastroenterology. Follows up today with right flank pain and headache.  She noted the pain is more in the upper back on the right, and is like she had when saw Kristeen Miss previously, she describes it as a constant ache, and intermittently it worsens.  No specific activity makes it worsen, although she does note often when changing positions it occasionally worsens.  He notes it can worsen when she takes deep breath at times.  Denies any chest pain or shortness of breath.  Denies any nausea or vomiting, no fevers, denies urinary symptoms with no frequency, no burning with urination.  She still has some intermittent constipation issues, and when she strains with bowel movements, the pain can worsen  as well.  She has been trying Tylenol and ibuprofen products as needed in the recent past.  She also has been applying heat, sometimes sleeping on a heating pad at night. She states she did not get the x-rays previously ordered by [facility] as she did not think it was a bony source to her pain, and did not have the extra dollars to do the x-rays. She also notes she has been having a headache in the past 6 days, more of a constant and intermittent headache, and feels it more in the back of the head with no sinus pains or congestion.  Has been using Tylenol or ibuprofen products as needed for the headaches.  She noted she has an appointment to follow-up with Dr. Ancil Boozer in January scheduled as well.  She has been followed for chronic kidney disease and other issues listed in her active problem list.  Her creatinine was elevated on last check in July.  She is a former smoker.   Lab Results  Component Value Date   CREATININE 1.41 (H) 08/06/2019   BUN 12 08/06/2019   NA 141 08/06/2019   K 4.1 08/06/2019   CL 102 08/06/2019   CO2 29 08/06/2019     Patient Active Problem List   Diagnosis Date Noted  . CKD (chronic kidney disease) stage 3, GFR 30-59 ml/min (HCC) 06/11/2019  . PTSD (post-traumatic stress disorder) 01/14/2019  . Borderline personality disorder (  [place]) 01/14/2019  . Anxiety 01/14/2019  . Mixed hyperlipidemia 01/14/2019  . Morbid obesity with BMI of 50.0-59.9, adult ([place]) 01/14/2019  . OSA on CPAP 01/14/2019  . Asthma 04/30/2018  . Low back pain 08/20/2017  . 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 Mastitis, left, acute 12/20/2016  . Essential hypertension       Current Outpatient Medications:  .  acetaminophen (TYLENOL) 325 MG tablet, Take 650 mg by mouth every 6 (six) hours as needed. , Disp: , Rfl:  .  albuterol (VENTOLIN HFA) 108 (90 Base) MCG/ACT inhaler, Inhale 2 puffs into the lungs every 6 (six) hours as needed for wheezing or shortness of breath., Disp: 18 g, Rfl: 0  .  atorvastatin (LIPITOR) 20 MG tablet, Take 1 tablet (20 mg total) by mouth daily., Disp: 30 tablet, Rfl: 5 .  buPROPion (WELLBUTRIN SR) 150 MG 12 hr tablet, Take 1 tablet (150 mg total) by mouth 2 (two) times daily., Disp: 60 tablet, Rfl: 0 .  cholecalciferol (VITAMIN D3) 25 MCG (1000 UNIT) tablet, Take 1,000 Units by mouth daily., Disp: , Rfl:  .  escitalopram (LEXAPRO) 20 MG tablet, Take 1 tablet (20 mg total) by mouth daily., Disp: 30 tablet, Rfl: 0 .  hydrochlorothiazide (HYDRODIURIL) 25 MG tablet, Take 1 tablet (25 mg total) by mouth daily., Disp: 30 tablet, Rfl: 5 .  hydrOXYzine (ATARAX/VISTARIL) 50 MG tablet, Take 1 tablet (50 mg total) by mouth 2 (two) times daily., Disp: 60 tablet, Rfl: 5 .  linaclotide (LINZESS) 290 MCG CAPS capsule, Take 1 capsule (290 mcg total) by mouth daily before breakfast., Disp: 90 capsule, Rfl: 0 .  methocarbamol (ROBAXIN) 500 MG tablet, Take 1 tablet (500 mg total) by mouth every 8 (eight) hours as needed for muscle spasms., Disp: 60 tablet, Rfl: 1 .  modafinil (PROVIGIL) 200 MG tablet, Take 1 tablet (200 mg total) by mouth daily., Disp: 30 tablet, Rfl: 2 .  polyethylene glycol powder (GLYCOLAX/MIRALAX) 17 GM/SCOOP powder, Take 8.5-17 g by mouth daily as needed (constipation)., Disp: 3350 g, Rfl: 1   Allergies  Allergen Reactions  . Latex Rash     Past Surgical History:  Procedure Laterality Date  . ABDOMINAL HYSTERECTOMY  2008  . BREAST BIOPSY Left 2013   CORE - FIBROADENOMA VS PHYLLODES  . CESAREAN SECTION  2007  . COLONOSCOPY WITH PROPOFOL N/A 10/09/2016   Procedure: COLONOSCOPY WITH PROPOFOL;  Surgeon: Jonathon Bellows, MD;  Location: [facility] ENDOSCOPY;  Service: Gastroenterology;  Laterality: N/A;  . COLONOSCOPY WITH PROPOFOL N/A 11/09/2019   Procedure: COLONOSCOPY WITH PROPOFOL;  Surgeon: Lin Landsman, MD;  Location: [facility] ENDOSCOPY;  Service: Gastroenterology;  Laterality: N/A;  . GUM SURGERY  2004  . PLANTAR FASCIA RELEASE Left 08/19/2014    Procedure: ENDOSCOPIC PLANTAR FASCIOTOMY RELEASE L WITH TOPAZ;  Surgeon: Albertine Patricia, DPM;  Location: [place];  Service: Podiatry;  Laterality: Left;  LMA WITH POPLITEAL BLOCK  . TONSILLECTOMY  1984     Family History  Problem Relation Age of Onset  . Testicular cancer Brother   . Colon cancer Paternal Grandfather   . Breast cancer Maternal Grandmother   . Hypertension Maternal Grandmother   . Aneurysm Maternal Grandmother   . Stroke Maternal Grandmother   . Obesity Mother   . Diabetes Mother   . High Cholesterol Mother   . Hypertension Mother   . Neuropathy Mother   . Depression Mother   . Anxiety disorder Mother   . Obesity Father   .  Hypertension Father   . High Cholesterol Father   . Depression Father   . Anxiety disorder Father   . Hypertension Maternal Grandfather   . High Cholesterol Maternal Grandfather   . Heart disease Maternal Grandfather        has a pacemaker  . Stroke Maternal Grandfather   . Alzheimer's disease Paternal Grandmother   . Dementia Paternal Grandmother      Social History   Tobacco Use  . Smoking status: Former Smoker    Packs/day: 0.25    Years: 10.00    Pack years: 2.50    Types: Cigarettes    Quit date: 07/05/2014    Years since quitting: 5.3  . Smokeless tobacco: Never Used  Substance Use Topics  . Alcohol use: Yes    Comment: rare    With staff assistance, above reviewed with the patient today.  ROS: As per HPI, otherwise no specific complaints on a limited and focused system review   No results found for this or any previous visit (from the past 72 hour(s)).   PHQ2/9: Depression screen [facility] 2/9 11/24/2019 09/29/2019 08/06/2019 01/14/2019 09/25/2018  Decreased Interest 2 2 1 1 1   Down, Depressed, Hopeless - 1 1 1 1   PHQ - 2 Score 2 3 2 2 2   Altered sleeping 3 - 2 1 3   Tired, decreased energy 2 - 2 1 3   Change in appetite 2 - 1 1 3   Feeling bad or failure about yourself  0 - 0 0 1  Trouble concentrating 2 - 2  1 0  Moving slowly or fidgety/restless 1 - 0 1 0  Suicidal thoughts 0 - 0 0 0  PHQ-9 Score 12 - 9 7 12   Difficult doing work/chores Somewhat difficult - Somewhat difficult Somewhat difficult Somewhat difficult   PHQ-2/9 Result reviewed   Fall Risk: Fall Risk  09/29/2019 08/06/2019 01/14/2019 09/25/2018 09/04/2018  Falls in the past year? 1 0 0 0 0  Number falls in past yr: 1 0 0 0 0  Injury with Fall? 0 0 0 0 0  Follow up Falls evaluation completed - Falls evaluation completed Falls evaluation completed Falls evaluation completed      Objective:   Vitals:   11/24/19 1108  BP: 110/80  Pulse: 86  Resp: 16  Temp: 98.9 F (37.2 C)  TempSrc: Oral  SpO2: 97%  Weight: (!) 310 lb 12.8 oz (141 kg)  Height: 5\' 5"  (1.651 m)    Body mass index is 51.72 kg/m.  Physical Exam   NAD, masked, pleasant HEENT - /AT, sclera anicteric, PERRL, EOMI, conj - non-inj'ed, pharynx clear Neck - supple, no adenopathy, no TM,  Car - RRR without m/g/r, not tachycardic Pulm- RR and effort normal at rest, CTA without wheeze or rales Abd - soft, obese, noted some mild tenderness in the right abdominal region, more mid and upper quadrant and radiating towards the epigastric area, no focal lower quadrant tenderness, no McBurney's point tenderness, no rebound or guarding, ND, BS+,  no obvious masses Back - no CVA tenderness, focally tender above the CVA region on the right and a small area midway between the spine and axillary line.  No erythema or bruising or swelling at this site, no obvious crepitus of posterior ribs, nontender palpating over the thoracic and lumbar spine, Skin- no rash noted on exposed areas,  Ext - no marked lower extremity edema With forward elevation and abduction of her arms, and with the Apley scratch test from above,  she did note some mild increase discomfort in the upper back region where she feels the pain. Neuro/psychiatric - affect was not flat, appropriate with conversation   Alert with normal speech  Grossly non-focal   Results for orders placed or performed during the hospital encounter of 11/09/19  Surgical pathology  Result Value Ref Range   SURGICAL PATHOLOGY      SURGICAL PATHOLOGY CASE: [phone] [phone] PATIENT: Samyuktha Wojtkiewicz Surgical Pathology Report     Specimen Submitted: A. Colon polyp, transverse; cold snare  Clinical History: Adenoma of colon D12.6.  Colon polyp, hemorrhoids.      DIAGNOSIS: A. COLON POLYP, TRANSVERSE; COLD SNARE: - SESSILE SERRATED POLYP. - NEGATIVE FOR DYSPLASIA AND MALIGNANCY.   GROSS DESCRIPTION: A. Labeled: Transverse colon polyp cold snare Received: In formalin Tissue fragment(s): 2 Size: 0.7 x 0.6 x 0.1 cm Description: Aggregate of tan tissue fragments Entirely submitted in 1 cassette.   Final Diagnosis performed by Betsy Pries, MD.   Electronically signed 11/10/2019 9:40:15AM The electronic signature indicates that the named Attending Pathologist has evaluated the specimen Technical component performed at [facility], [address], [place], [place] [address] Lab: [phone] Dir: Rush Farmer, MD, MMM  Professional component performed at [facility], [facility],  [place], [place], [place] [address] Lab: [id number] Dir: Dellia Nims. Reuel Derby, MD        Assessment & Plan:   1. Flank pain/CKD history Feel the discomfort is not in the right flank but more above the CVA region. Did check a urine dip for completeness sake today and was negative with just some trace/moderate blood noted. Doubt a kidney source to her discomfort, although did discuss that in the differential. Discussed potentially getting a blood test to help assess, which would include kidney function tests as part of that panel, but will hold on getting that today.  Also discussed potentially getting an ultrasound at some point in the near future pending her status. - POCT urinalysis dipstick  2. Acute right-sided  thoracic back pain Do feel her discomfort is more right upper back pain, with a likely chest wall/costochondral component.  It is consistent with this by description, and has been present for months now.  Increased pain with palpation to the area, and increased with movements of the shoulders, and has a positional component by history.  She denies a prior trauma before this came on. Felt best to get an x-ray of the chest to help further assess and ensure there are no concerns from the chest x-ray. Recommended contrast therapy approach locally including warm compresses to the area, or following warm water to run down the area in his shower, followed by some gentle motion activities including some deep breath exercises as well as some shoulder and arm motions, followed by applying cold to the area.  She has been applying heat, and recommended the importance of including cold in the local measures. Discussed anti-inflammatory medicines, and she wanted to pursue that today, and she asked for a stronger one and discussed these options.  We will try naproxen product, emphasized taking with food as needed and over time lessening use as are concerns with these medicines and kidney function in addition to potentially causing stomach issues as well. She has a muscle relaxer to take (Robaxin), and can use that as needed, recommended taking predominantly at nighttime as may make her drowsy. Noting how long she has been having the symptoms, noted it does take weeks not days to hopefully slowly improve over time,  and await her response.  - DG Chest 2 View; Future - naproxen sodium (ANAPROX DS) 550 MG tablet; Take 1 tablet (550 mg total) by mouth 2 (two) times daily with a meal. Take as needed for upper back pain.  Dispense: 28 tablet; Refill: 1  3. Acute nonintractable headache, unspecified headache type She noted the headache has been more constant and posterior in the occipital area, and do feel this could likely be  a muscle based headache.  Has had no more concerning associated symptoms, and do feel the naproxen product will help with this as well.   4. Constipation, unspecified constipation type She noted she was out of the MiraLAX product, and the Linzess product was recently started with gastroenterology's input. Felt best to continue the Linzess presently, and assess her response.  5. Morbid obesity with BMI of 50.0-59.9, adult ([place]) Noted as a comorbidity.  Await the chest x-ray result, and her response to the above if the chest x-ray is negative. She should follow-up if symptoms are not improving or more problematic despite the above as some next step testing may be needed as was briefly discussed today. She should also keep her planned follow-up in January with Dr. Ancil Boozer as well.    Towanda Malkin, MD 11/24/19 11:25 AM

## 2019-11-25 ENCOUNTER — Encounter: Payer: Self-pay | Admitting: Internal Medicine

## 2019-12-15 ENCOUNTER — Other Ambulatory Visit: Payer: Self-pay | Admitting: Family Medicine

## 2019-12-15 DIAGNOSIS — G4733 Obstructive sleep apnea (adult) (pediatric): Secondary | ICD-10-CM

## 2019-12-15 NOTE — Telephone Encounter (Signed)
Requested medication (s) are due for refill today:   Provider to determine  Requested medication (s) are on the active medication list:   Yes  Future visit scheduled:  Yes in 1 mo. With Dr. Ancil Boozer   Last ordered: 08/10/2019 #30, 2 refills  Returned because no protocol assigned to this medication.   Requested Prescriptions  Pending Prescriptions Disp Refills   modafinil (PROVIGIL) 200 MG tablet [Pharmacy Med Name: MODAFINIL 200 MG TABLET] 30 tablet     Sig: TAKE ONE TABLET BY MOUTH DAILY      Off-Protocol Failed - 12/15/2019 10:22 AM      Failed - Medication not assigned to a protocol, review manually.      Passed - Valid encounter within last 12 months    Recent Outpatient Visits           3 weeks ago Flank pain   [facility] Lebron Conners D, MD   2 months ago Chronic bilateral low back pain without sciatica   [facility] Delsa Grana, PA-C   4 months ago Morbid obesity with BMI of 50.0-59.9, adult [facility])   [facility] Steele Sizer, MD   11 months ago PTSD (post-traumatic stress disorder)   [place], Shelly, FNP   1 year ago Well woman exam (no gynecological exam)   [place], [place]       Future Appointments             In 1 month Steele Sizer, MD [facility], [facility]

## 2020-01-22 ENCOUNTER — Emergency Department: Admission: EM | Admit: 2020-01-22 | Discharge: 2020-01-22 | Discharge status: Against Medical Advice | Payer: 59

## 2020-01-22 NOTE — ED Notes (Signed)
Pt states she does not want to wait in lobby a long time. Pt encouraged to stay and be seen. Pt states she will return when it is not busy, pt informed that due to pandemic it is busy almost all the time. Pt informed do not let wait discourage her from being seen. Pt chooses to leave prior to triage.

## 2020-02-01 ENCOUNTER — Other Ambulatory Visit: Payer: Self-pay | Admitting: Family Medicine

## 2020-02-01 DIAGNOSIS — G4733 Obstructive sleep apnea (adult) (pediatric): Secondary | ICD-10-CM

## 2020-02-05 NOTE — Progress Notes (Signed)
Skin lName: Barbara Pennington   MRN: [id number]    DOB: 10/26/1980   Date:02/08/2020       Progress Note  Subjective  Chief Complaint  Follow up/ Knee Sprain  HPI  Psychiatric Concerns:She was going to  [facility] for care - Rx'd Lexapro and Wellbutrin. She states does not have time in her schedule for counseling at this time. She was diagnosed with BPD, PTSD, Depression, and Anxiety. She has always struggled with anxiety, but was much worse after she developed PTSD in 2009/2010. She has had 3 prior suicide attempts - she has attempted overdose on each occasion with her own medications She was not hospitalized but did seek help with the national suicide hotline. She is now doing group therapy. She is under more stress but has been able to adjust   HTN: She was diagnosed in the ER in November 2019 and was but on HCTZ 25mg . She has been cutting on caffeine, no longer drinking energy drinks. She is decreasing salt intake.   Morbid Obesity: In high school she was about 180lbs, after having her children she reached her heaviest weight at 260lbs. In 2009/2010 -  cooking at home, she was using exercise bike but stopped dur to recent knee injury. Discussed weight watchers.   Right knee injury: it happened while climbing on her bed, heard a pop and immediately swelling, she tried ice but it got worse when to [facility] and was not seen, followed up with [facility], was given a brace but still has pain, instability , wearing a brace.   Chronic Low Back Pain: going on for year, taking tizanidine at night, although not on her medication list . She denies radiculitis. She is not taking Lyrica but seems to be controlled with Tylenol   OSA/Asthma:She is using her CPAP machine regularly, she feels tired during the day, she has been taking energy drinks and otc B12 supplementation. She is taking Modafinil and states initially she felt it helped but now feels tired again, but not daily, she would like to continue taking  medication. She has been using rescue inhaler 4 times a week, we will add Advair  Dyslipidemia: She is taking atorvastatin and no concerns at this time; no myalgias, no chest pain, no reported SE's. Reviewed last labs. LDL is at goal for her   CKI stage III: she is avoiding NSAID's, taking tylenol only. We will recheck labs. Urine micro negative   Patient Active Problem List   Diagnosis Date Noted  . CKD (chronic kidney disease) stage 3, GFR 30-59 ml/min (HCC) 06/11/2019  . PTSD (post-traumatic stress disorder) 01/14/2019  . Borderline personality disorder ([place]) 01/14/2019  . Anxiety 01/14/2019  . Mixed hyperlipidemia 01/14/2019  . Morbid obesity with BMI of 50.0-59.9, adult ([place]) 01/14/2019  . OSA on CPAP 01/14/2019  . Asthma 04/30/2018  . Low back pain 08/20/2017  . 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 Mastitis, left, acute 12/20/2016  . Essential hypertension     Past Surgical History:  Procedure Laterality Date  . ABDOMINAL HYSTERECTOMY  2008  . BREAST BIOPSY Left 2013   CORE - FIBROADENOMA VS PHYLLODES  . CESAREAN SECTION  2007  . COLONOSCOPY WITH PROPOFOL N/A 10/09/2016   Procedure: COLONOSCOPY WITH PROPOFOL;  Surgeon: Jonathon Bellows, MD;  Location: [facility] ENDOSCOPY;  Service: Gastroenterology;  Laterality: N/A;  . COLONOSCOPY WITH PROPOFOL N/A 11/09/2019   Procedure: COLONOSCOPY WITH PROPOFOL;  Surgeon: Lin Landsman, MD;  Location: [facility] ENDOSCOPY;  Service: Gastroenterology;  Laterality: N/A;  . GUM SURGERY  2004  . PLANTAR FASCIA RELEASE Left 08/19/2014   Procedure: ENDOSCOPIC PLANTAR FASCIOTOMY RELEASE L WITH TOPAZ;  Surgeon: Albertine Patricia, DPM;  Location: [facility];  Service: Podiatry;  Laterality: Left;  LMA WITH POPLITEAL BLOCK  . TONSILLECTOMY  1984    Family History  Problem Relation Age of Onset  . Testicular cancer Brother   . Colon cancer Paternal Grandfather   . Breast cancer Maternal Grandmother   . Hypertension Maternal Grandmother   .  Aneurysm Maternal Grandmother   . Stroke Maternal Grandmother   . Obesity Mother   . Diabetes Mother   . High Cholesterol Mother   . Hypertension Mother   . Neuropathy Mother   . Depression Mother   . Anxiety disorder Mother   . Obesity Father   . Hypertension Father   . High Cholesterol Father   . Depression Father   . Anxiety disorder Father   . Hypertension Maternal Grandfather   . High Cholesterol Maternal Grandfather   . Heart disease Maternal Grandfather        has a pacemaker  . Stroke Maternal Grandfather   . Alzheimer's disease Paternal Grandmother   . Dementia Paternal Grandmother     Social History   Tobacco Use  . Smoking status: Former Smoker    Packs/day: 0.25    Years: 10.00    Pack years: 2.50    Types: Cigarettes    Quit date: 07/05/2014    Years since quitting: 5.6  . Smokeless tobacco: Never Used  Substance Use Topics  . Alcohol use: Yes    Comment: rare     Current Outpatient Medications:  .  acetaminophen (TYLENOL) 325 MG tablet, Take 650 mg by mouth every 6 (six) hours as needed. , Disp: , Rfl:  .  buPROPion (WELLBUTRIN XL) 300 MG 24 hr tablet, Take 300 mg by mouth every morning., Disp: , Rfl:  .  cholecalciferol (VITAMIN D3) 25 MCG (1000 UNIT) tablet, Take 1,000 Units by mouth daily., Disp: , Rfl:  .  escitalopram (LEXAPRO) 20 MG tablet, Take 1 tablet (20 mg total) by mouth daily., Disp: 30 tablet, Rfl: 0 .  Fluticasone-Salmeterol (ADVAIR DISKUS) 100-50 MCG/DOSE AEPB, Inhale 1 puff into the lungs in the morning and at bedtime., Disp: 60 each, Rfl: 3 .  hydrOXYzine (ATARAX/VISTARIL) 50 MG tablet, Take 1 tablet (50 mg total) by mouth 2 (two) times daily., Disp: 60 tablet, Rfl: 5 .  linaclotide (LINZESS) 290 MCG CAPS capsule, Take 1 capsule (290 mcg total) by mouth daily before breakfast., Disp: 90 capsule, Rfl: 0 .  albuterol (VENTOLIN HFA) 108 (90 Base) MCG/ACT inhaler, Inhale 2 puffs into the lungs every 6 (six) hours as needed for wheezing or  shortness of breath., Disp: 18 g, Rfl: 0 .  atorvastatin (LIPITOR) 20 MG tablet, Take 1 tablet (20 mg total) by mouth daily., Disp: 90 tablet, Rfl: 1 .  hydrochlorothiazide (HYDRODIURIL) 25 MG tablet, Take 1 tablet (25 mg total) by mouth daily., Disp: 90 tablet, Rfl: 1 .  modafinil (PROVIGIL) 200 MG tablet, Take 1 tablet (200 mg total) by mouth daily., Disp: 30 tablet, Rfl: 3  Allergies  Allergen Reactions  . Latex Rash    I personally reviewed active problem list, medication list, allergies, family history, social history, health maintenance with the patient/caregiver today.   ROS  Constitutional: Negative for fever or weight change.  Respiratory: Negative for cough and shortness of breath.   Cardiovascular: Negative  for chest pain or palpitations.  Gastrointestinal: Negative for abdominal pain, no bowel changes.  Musculoskeletal: Negative for gait problem or joint swelling.  Skin: Negative for rash.  Neurological: Negative for dizziness or headache.  No other specific complaints in a complete review of systems (except as listed in HPI above).  Objective  Vitals:   02/08/20 0738  BP: 116/72  Pulse: 85  Resp: 16  Temp: 98 F (36.7 C)  TempSrc: Oral  SpO2: 97%  Weight: (!) 322 lb 3.2 oz (146.1 kg)  Height: 5\' 5"  (1.651 m)    Body mass index is 53.62 kg/m.  Physical Exam  Constitutional: Patient appears well-developed and well-nourished. Obese  No distress.  HEENT: head atraumatic, normocephalic, pupils equal and reactive to light,  neck supple, Cardiovascular: Normal rate, regular rhythm and normal heart sounds.  No murmur heard. No BLE edema. Pulmonary/Chest: Effort normal and breath sounds normal. No respiratory distress. Abdominal: Soft.  There is no tenderness. Muscular skeletal: brace on right knee  Skin: 1.5 inch long, raised lesion on right lower leg, with area of erythema and other spots dry and scaly  Psychiatric: Patient has a normal mood and affect.  behavior is normal. Judgment and thought content normal.   Recent Results (from the past 2160 hour(s))  POCT urinalysis dipstick     Status: Abnormal   Collection Time: 11/24/19 12:13 PM  Result Value Ref Range   Color, UA yellow    Clarity, UA clear    Glucose, UA Negative Negative   Bilirubin, UA Negative    Ketones, UA Negative    Spec Grav, UA 1.025 1.010 - 1.025   Blood, UA Positive     Comment: Trace(Moderate)   pH, UA 6.0 5.0 - 8.0   Protein, UA Negative Negative   Urobilinogen, UA 0.2 0.2 or 1.0 E.U./dL   Nitrite, UA Negative    Leukocytes, UA Negative Negative   Appearance Normal    Odor Normal       PHQ2/9: Depression screen [facility] 2/9 02/08/2020 11/24/2019 09/29/2019 08/06/2019 01/14/2019  Decreased Interest 1 2 2 1 1   Down, Depressed, Hopeless 0 - 1 1 1   PHQ - 2 Score 1 2 3 2 2   Altered sleeping 1 3 - 2 1  Tired, decreased energy 1 2 - 2 1  Change in appetite 3 2 - 1 1  Feeling bad or failure about yourself  0 0 - 0 0  Trouble concentrating 2 2 - 2 1  Moving slowly or fidgety/restless 0 1 - 0 1  Suicidal thoughts 0 0 - 0 0  PHQ-9 Score 8 12 - 9 7  Difficult doing work/chores - Somewhat difficult - Somewhat difficult Somewhat difficult    phq 9 is positive   Fall Risk: Fall Risk  02/08/2020 09/29/2019 08/06/2019 01/14/2019 09/25/2018  Falls in the past year? 1 1 0 0 0  Number falls in past yr: 1 1 0 0 0  Injury with Fall? 1 0 0 0 0  Follow up - Falls evaluation completed - Falls evaluation completed Falls evaluation completed     Functional Status Survey: Is the patient deaf or have difficulty hearing?: No Does the patient have difficulty seeing, even when wearing glasses/contacts?: No Does the patient have difficulty concentrating, remembering, or making decisions?: Yes Does the patient have difficulty walking or climbing stairs?: No Does the patient have difficulty dressing or bathing?: No Does the patient have difficulty doing errands alone such as visiting  a doctor's office  or shopping?: No    Assessment & Plan  1. OSA on CPAP  - modafinil (PROVIGIL) 200 MG tablet; Take 1 tablet (200 mg total) by mouth daily.  Dispense: 30 tablet; Refill: 3  2. Mixed hyperlipidemia  - atorvastatin (LIPITOR) 20 MG tablet; Take 1 tablet (20 mg total) by mouth daily.  Dispense: 90 tablet; Refill: 1  3. Essential hypertension  - hydrochlorothiazide (HYDRODIURIL) 25 MG tablet; Take 1 tablet (25 mg total) by mouth daily.  Dispense: 90 tablet; Refill: 1  4. Mild persistent allergic atopic asthma without complication  - albuterol (VENTOLIN HFA) 108 (90 Base) MCG/ACT inhaler; Inhale 2 puffs into the lungs every 6 (six) hours as needed for wheezing or shortness of breath.  Dispense: 18 g; Refill: 0 - Fluticasone-Salmeterol (ADVAIR DISKUS) 100-50 MCG/DOSE AEPB; Inhale 1 puff into the lungs in the morning and at bedtime.  Dispense: 60 each; Refill: 3  5. GAD (generalized anxiety disorder)   6. Morbid obesity with BMI of 50.0-59.9, adult [facility])  Discussed with the patient the risk posed by an increased BMI. Discussed importance of portion control, calorie counting and at least 150 minutes of physical activity weekly. Avoid sweet beverages and drink more water. Eat at least 6 servings of fruit and vegetables daily   7. Stage 3a chronic kidney disease (HCC)  - BASIC METABOLIC PANEL WITH GFR - Parathyroid hormone, intact (no Ca) If still abnormal we will switch from HcTZ 25 to lisinopril hctz   8. Moderate episode of recurrent major depressive disorder ([place])  Keep follow up with Psychiatrist   9. Borderline personality disorder ([place])  She states doing well, likes therapy   10. Skin lesion of right leg  - Ambulatory referral to Dermatology

## 2020-02-08 ENCOUNTER — Ambulatory Visit: Payer: 59 | Admitting: Family Medicine

## 2020-02-08 ENCOUNTER — Other Ambulatory Visit: Payer: Self-pay

## 2020-02-08 ENCOUNTER — Encounter: Payer: Self-pay | Admitting: Family Medicine

## 2020-02-08 VITALS — BP 116/72 | HR 85 | Temp 98.0°F | Resp 16 | Ht 65.0 in | Wt 322.2 lb

## 2020-02-08 DIAGNOSIS — G4733 Obstructive sleep apnea (adult) (pediatric): Secondary | ICD-10-CM | POA: Diagnosis not present

## 2020-02-08 DIAGNOSIS — Z9989 Dependence on other enabling machines and devices: Secondary | ICD-10-CM

## 2020-02-08 DIAGNOSIS — F603 Borderline personality disorder: Secondary | ICD-10-CM

## 2020-02-08 DIAGNOSIS — F331 Major depressive disorder, recurrent, moderate: Secondary | ICD-10-CM | POA: Insufficient documentation

## 2020-02-08 DIAGNOSIS — F411 Generalized anxiety disorder: Secondary | ICD-10-CM

## 2020-02-08 DIAGNOSIS — J4530 Mild persistent asthma, uncomplicated: Secondary | ICD-10-CM | POA: Diagnosis not present

## 2020-02-08 DIAGNOSIS — E782 Mixed hyperlipidemia: Secondary | ICD-10-CM

## 2020-02-08 DIAGNOSIS — L989 Disorder of the skin and subcutaneous tissue, unspecified: Secondary | ICD-10-CM

## 2020-02-08 DIAGNOSIS — Z6841 Body Mass Index (BMI) 40.0 and over, adult: Secondary | ICD-10-CM

## 2020-02-08 DIAGNOSIS — N1831 Chronic kidney disease, stage 3a: Secondary | ICD-10-CM

## 2020-02-08 DIAGNOSIS — I10 Essential (primary) hypertension: Secondary | ICD-10-CM

## 2020-02-08 MED ORDER — ATORVASTATIN CALCIUM 20 MG PO TABS: 20.0000 mg | ORAL_TABLET | Freq: Every day | ORAL | 1 refills | Status: DC

## 2020-02-08 MED ORDER — FLUTICASONE-SALMETEROL 100-50 MCG/DOSE IN AEPB: 1.0000 | INHALATION_SPRAY | Freq: Two times a day (BID) | RESPIRATORY_TRACT | 3 refills | Status: DC

## 2020-02-08 MED ORDER — ALBUTEROL SULFATE HFA 108 (90 BASE) MCG/ACT IN AERS: 2.0000 | INHALATION_SPRAY | Freq: Four times a day (QID) | RESPIRATORY_TRACT | 0 refills | Status: DC | PRN

## 2020-02-08 MED ORDER — HYDROCHLOROTHIAZIDE 25 MG PO TABS: 25.0000 mg | ORAL_TABLET | Freq: Every day | ORAL | 1 refills | Status: DC

## 2020-02-08 MED ORDER — MODAFINIL 200 MG PO TABS: 200.0000 mg | ORAL_TABLET | Freq: Every day | ORAL | 3 refills | Status: DC

## 2020-02-09 ENCOUNTER — Encounter: Payer: Self-pay | Admitting: Family Medicine

## 2020-02-09 ENCOUNTER — Other Ambulatory Visit: Payer: Self-pay | Admitting: Family Medicine

## 2020-02-09 LAB — BASIC METABOLIC PANEL WITH GFR
BUN/Creatinine Ratio: 11 (calc) (ref 6–22)
BUN: 14 mg/dL (ref 7–25)
CO2: 30 mmol/L (ref 20–32)
Calcium: 8.8 mg/dL (ref 8.6–10.2)
Chloride: 105 mmol/L (ref 98–110)
Creat: 1.26 mg/dL — ABNORMAL HIGH (ref 0.50–1.10)
GFR, Est African American: 62 mL/min/{1.73_m2} (ref >=60)
GFR, Est Non African American: 53 mL/min/{1.73_m2} — ABNORMAL LOW (ref >=60)
Glucose, Bld: 86 mg/dL (ref 65–99)
Potassium: 3.6 mmol/L (ref 3.5–5.3)
Sodium: 141 mmol/L (ref 135–146)

## 2020-02-09 LAB — PARATHYROID HORMONE, INTACT (NO CA): PTH: 78 pg/mL — ABNORMAL HIGH (ref 14–64)

## 2020-02-09 MED ORDER — TIZANIDINE HCL 4 MG PO TABS: 4.0000 mg | ORAL_TABLET | Freq: Four times a day (QID) | ORAL | 0 refills | Status: DC | PRN

## 2020-02-11 ENCOUNTER — Other Ambulatory Visit: Payer: Self-pay | Admitting: Family Medicine

## 2020-02-11 DIAGNOSIS — N2581 Secondary hyperparathyroidism of renal origin: Secondary | ICD-10-CM

## 2020-02-11 DIAGNOSIS — N1831 Chronic kidney disease, stage 3a: Secondary | ICD-10-CM

## 2020-02-15 ENCOUNTER — Encounter: Payer: Self-pay | Admitting: Family Medicine

## 2020-02-20 ENCOUNTER — Other Ambulatory Visit: Payer: Self-pay | Admitting: Gastroenterology

## 2020-02-20 DIAGNOSIS — K5904 Chronic idiopathic constipation: Secondary | ICD-10-CM

## 2020-02-22 MED ORDER — LINACLOTIDE 290 MCG PO CAPS: 290.0000 ug | ORAL_CAPSULE | Freq: Every day | ORAL | 1 refills | Status: DC

## 2020-03-04 ENCOUNTER — Other Ambulatory Visit (HOSPITAL_COMMUNITY): Payer: Self-pay | Admitting: Nephrology

## 2020-03-04 ENCOUNTER — Other Ambulatory Visit: Payer: Self-pay | Admitting: Nephrology

## 2020-03-04 DIAGNOSIS — I10 Essential (primary) hypertension: Secondary | ICD-10-CM

## 2020-03-04 DIAGNOSIS — N1831 Chronic kidney disease, stage 3a: Secondary | ICD-10-CM

## 2020-03-04 DIAGNOSIS — R8290 Unspecified abnormal findings in urine: Secondary | ICD-10-CM

## 2020-03-04 DIAGNOSIS — E663 Overweight: Secondary | ICD-10-CM

## 2020-03-09 ENCOUNTER — Other Ambulatory Visit: Payer: Self-pay

## 2020-03-09 ENCOUNTER — Ambulatory Visit
Admission: RE | Admit: 2020-03-09 | Discharge: 2020-03-09 | Disposition: A | Discharge status: Home / Self Care | Payer: 59 | Source: Ambulatory Visit | Attending: Nephrology | Admitting: Nephrology

## 2020-03-09 DIAGNOSIS — E663 Overweight: Secondary | ICD-10-CM

## 2020-03-09 DIAGNOSIS — N1831 Chronic kidney disease, stage 3a: Secondary | ICD-10-CM | POA: Insufficient documentation

## 2020-03-09 DIAGNOSIS — R8290 Unspecified abnormal findings in urine: Secondary | ICD-10-CM | POA: Diagnosis present

## 2020-03-09 DIAGNOSIS — I10 Essential (primary) hypertension: Secondary | ICD-10-CM

## 2020-03-24 ENCOUNTER — Other Ambulatory Visit: Payer: Self-pay | Admitting: Family Medicine

## 2020-03-24 MED ORDER — TIZANIDINE HCL 4 MG PO TABS
4.0000 mg | ORAL_TABLET | Freq: Four times a day (QID) | ORAL | 0 refills | Status: DC | PRN
Start: 2020-03-24 — End: 2020-06-17

## 2020-04-04 ENCOUNTER — Emergency Department: Payer: 59

## 2020-04-04 ENCOUNTER — Encounter: Payer: Self-pay | Admitting: Emergency Medicine

## 2020-04-04 ENCOUNTER — Emergency Department
Admission: EM | Admit: 2020-04-04 | Discharge: 2020-04-04 | Disposition: A | Discharge status: Home / Self Care | Payer: 59 | Attending: Emergency Medicine | Admitting: Emergency Medicine

## 2020-04-04 ENCOUNTER — Ambulatory Visit: Payer: Self-pay | Admitting: *Deleted

## 2020-04-04 DIAGNOSIS — Z7951 Long term (current) use of inhaled steroids: Secondary | ICD-10-CM | POA: Diagnosis not present

## 2020-04-04 DIAGNOSIS — R1031 Right lower quadrant pain: Secondary | ICD-10-CM | POA: Diagnosis present

## 2020-04-04 DIAGNOSIS — Z91040 Latex allergy status: Secondary | ICD-10-CM | POA: Insufficient documentation

## 2020-04-04 DIAGNOSIS — I129 Hypertensive chronic kidney disease with stage 1 through stage 4 chronic kidney disease, or unspecified chronic kidney disease: Secondary | ICD-10-CM | POA: Diagnosis not present

## 2020-04-04 DIAGNOSIS — N183 Chronic kidney disease, stage 3 unspecified: Secondary | ICD-10-CM | POA: Insufficient documentation

## 2020-04-04 DIAGNOSIS — J45909 Unspecified asthma, uncomplicated: Secondary | ICD-10-CM | POA: Insufficient documentation

## 2020-04-04 DIAGNOSIS — Z79899 Other long term (current) drug therapy: Secondary | ICD-10-CM | POA: Diagnosis not present

## 2020-04-04 DIAGNOSIS — Z87891 Personal history of nicotine dependence: Secondary | ICD-10-CM | POA: Insufficient documentation

## 2020-04-04 DIAGNOSIS — N200 Calculus of kidney: Secondary | ICD-10-CM | POA: Insufficient documentation

## 2020-04-04 LAB — COMPREHENSIVE METABOLIC PANEL
ALT: 18 U/L (ref 0–44)
AST: 11 U/L — ABNORMAL LOW (ref 15–41)
Albumin: 4.1 g/dL (ref 3.5–5.0)
Alkaline Phosphatase: 45 U/L (ref 38–126)
Anion gap: 8 (ref 5–15)
BUN: 18 mg/dL (ref 6–20)
CO2: 24 mmol/L (ref 22–32)
Calcium: 9.3 mg/dL (ref 8.9–10.3)
Chloride: 108 mmol/L (ref 98–111)
Creatinine, Ser: 1.13 mg/dL — ABNORMAL HIGH (ref 0.44–1.00)
GFR, Estimated: >60 mL/min (ref >60)
Glucose, Bld: 106 mg/dL — ABNORMAL HIGH (ref 70–99)
Potassium: 3.6 mmol/L (ref 3.5–5.1)
Sodium: 140 mmol/L (ref 135–145)
Total Bilirubin: 0.6 mg/dL (ref 0.3–1.2)
Total Protein: 7 g/dL (ref 6.5–8.1)

## 2020-04-04 LAB — POC URINE PREG, ED: Preg Test, Ur: NEGATIVE

## 2020-04-04 LAB — CBC WITH DIFFERENTIAL/PLATELET
Abs Immature Granulocytes: 0.03 10*3/uL (ref 0.00–0.07)
Basophils Absolute: 0.1 10*3/uL (ref 0.0–0.1)
Basophils Relative: 1 %
Eosinophils Absolute: 0.2 10*3/uL (ref 0.0–0.5)
Eosinophils Relative: 3 %
HCT: 43 % (ref 36.0–46.0)
Hemoglobin: 14.4 g/dL (ref 12.0–15.0)
Immature Granulocytes: 0 %
Lymphocytes Relative: 38 %
Lymphs Abs: 2.7 10*3/uL (ref 0.7–4.0)
MCH: 30.1 pg (ref 26.0–34.0)
MCHC: 33.5 g/dL (ref 30.0–36.0)
MCV: 90 fL (ref 80.0–100.0)
Monocytes Absolute: 0.5 10*3/uL (ref 0.1–1.0)
Monocytes Relative: 7 %
Neutro Abs: 3.5 10*3/uL (ref 1.7–7.7)
Neutrophils Relative %: 51 %
Platelets: 202 10*3/uL (ref 150–400)
RBC: 4.78 MIL/uL (ref 3.87–5.11)
RDW: 13.2 % (ref 11.5–15.5)
WBC: 7 10*3/uL (ref 4.0–10.5)
nRBC: 0 % (ref 0.0–0.2)

## 2020-04-04 LAB — URINALYSIS, COMPLETE (UACMP) WITH MICROSCOPIC
Bilirubin Urine: NEGATIVE
Glucose, UA: NEGATIVE mg/dL
Hgb urine dipstick: NEGATIVE
Ketones, ur: NEGATIVE mg/dL
Leukocytes,Ua: NEGATIVE
Nitrite: NEGATIVE
Protein, ur: NEGATIVE mg/dL
Specific Gravity, Urine: 1.006 (ref 1.005–1.030)
pH: 6 (ref 5.0–8.0)

## 2020-04-04 LAB — PREGNANCY, URINE: Preg Test, Ur: NEGATIVE

## 2020-04-04 LAB — LIPASE, BLOOD: Lipase: 38 U/L (ref 11–51)

## 2020-04-04 MED ORDER — MORPHINE SULFATE (PF) 4 MG/ML IV SOLN
4.0000 mg | Freq: Once | INTRAVENOUS | Status: AC
  Administered 2020-04-04: 4 mg via INTRAVENOUS
  Filled 2020-04-04: qty 1

## 2020-04-04 MED ORDER — KETOROLAC TROMETHAMINE 30 MG/ML IJ SOLN
INTRAMUSCULAR | Status: AC
  Administered 2020-04-04: 30 mg via INTRAVENOUS
  Filled 2020-04-04: qty 1

## 2020-04-04 MED ORDER — ONDANSETRON HCL 4 MG/2ML IJ SOLN
4.0000 mg | Freq: Once | INTRAMUSCULAR | Status: AC
  Administered 2020-04-04: 4 mg via INTRAVENOUS
  Filled 2020-04-04: qty 2

## 2020-04-04 MED ORDER — ONDANSETRON HCL 4 MG PO TABS: 4.0000 mg | ORAL_TABLET | Freq: Three times a day (TID) | ORAL | 0 refills | Status: DC | PRN

## 2020-04-04 MED ORDER — LACTATED RINGERS IV BOLUS
1000.0000 mL | Freq: Once | INTRAVENOUS | Status: AC
  Administered 2020-04-04: 1000 mL via INTRAVENOUS

## 2020-04-04 MED ORDER — ACETAMINOPHEN 500 MG PO TABS: 1000.0000 mg | ORAL_TABLET | Freq: Once | ORAL | Status: AC

## 2020-04-04 MED ORDER — ACETAMINOPHEN 500 MG PO TABS
ORAL_TABLET | ORAL | Status: AC
  Administered 2020-04-04: 1000 mg via ORAL
  Filled 2020-04-04: qty 2

## 2020-04-04 MED ORDER — KETOROLAC TROMETHAMINE 30 MG/ML IJ SOLN: 30.0000 mg | Freq: Once | INTRAMUSCULAR | Status: AC

## 2020-04-04 MED ORDER — OXYCODONE-ACETAMINOPHEN 5-325 MG PO TABS: 1.0000 | ORAL_TABLET | Freq: Three times a day (TID) | ORAL | 0 refills | Status: AC | PRN

## 2020-04-04 NOTE — ED Triage Notes (Signed)
Right flank pain.  Started this morning after showering.  States pain radiates to groin.  2 episodes of emesis with pain.   AAOx3.  Skin warm and dry. NAD

## 2020-04-04 NOTE — ED Notes (Signed)
Report to Ryane, RN

## 2020-04-04 NOTE — ED Provider Notes (Signed)
[facility] Emergency Department Provider Note  ____________________________________________   Event Date/Time   First MD Initiated Contact with Patient 04/04/20 [phone]     (approximate)  I have reviewed the triage vital signs and the nursing notes.   HISTORY  Chief Complaint Flank Pain   HPI Barbara Pennington is a 40 y.o. female with past medical history of anxiety, pruritus, asthma, obesity and cesarean section requiring a partial hysterectomy as well as CKD who presents for assessment of cute onset of nontraumatic right lower back pain and right flank pain rating to the right lower quadrant of the abdomen started this morning while she was in the shower.  No recent injuries or falls or other trauma.  Endorses some nonbloody nonbilious emesis.  He denies any headache or earache, sore throat, fevers, acute shortness of breath, chest pain, left-sided abdominal or back pain, urinary symptoms, change in stool, rash or acute extremity pain.  She takes has chronic loose stools that she takes Linzess.  Denies any medications prior to arrival.  No other acute concerns at this time.  No history of kidney stones.         Past Medical History:  Diagnosis Date  . Anxiety   . Arthritis 2006   rhuematoid - mild - no current issues  . Asthma 2003  . Complication of anesthesia    ran fever after c-section - had infection.  "Usually" has low temp after surgery.  . Hypertension 2013  . Lump or mass in breast 2013   RIGHT BREAST  . Motion sickness    repeated amusement park rides  . Scoliosis    no current issues  . Shortness of breath dyspnea    secondary to weight   . Ulcer 2001    Patient Active Problem List   Diagnosis Date Noted  . Moderate episode of recurrent major depressive disorder ([place]) 02/08/2020  . CKD (chronic kidney disease) stage 3, GFR 30-59 ml/min (HCC) 06/11/2019  . PTSD (post-traumatic stress disorder) 01/14/2019  . Borderline personality  disorder ([place]) 01/14/2019  . Anxiety 01/14/2019  . Mixed hyperlipidemia 01/14/2019  . Morbid obesity with BMI of 50.0-59.9, adult ([place]) 01/14/2019  . OSA on CPAP 01/14/2019  . Asthma 04/30/2018  . Low back pain 08/20/2017  . 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 Mastitis, left, acute 12/20/2016  . Essential hypertension     Past Surgical History:  Procedure Laterality Date  . ABDOMINAL HYSTERECTOMY  2008  . BREAST BIOPSY Left 2013   CORE - FIBROADENOMA VS PHYLLODES  . CESAREAN SECTION  2007  . COLONOSCOPY WITH PROPOFOL N/A 10/09/2016   Procedure: COLONOSCOPY WITH PROPOFOL;  Surgeon: Jonathon Bellows, MD;  Location[facility] END[facility]COPY;  Service: Gastroenterology;  Laterality: N/A;  . COLONOSCOPY WITH PROPOFOL N/A 11/09/2019   Procedure: COLONOSCOPY WITH PROPOFOL;  Surgeon: Lin Landsman, MD;  Location: [facility] ENDOSCOPY;  Service: Gastroenterology;  Laterality: N/A;  . GUM SURGERY  2004  . PLANTAR FASCIA RELEASE Left 08/19/2014   Procedure: ENDOSCOPIC PLANTAR FASCIOTOMY RELEASE L WITH TOPAZ;  Surgeon: Albertine Patricia, DPM;  Location: [place];  Service: Podiatry;  Laterality: Left;  LMA WITH POPLITEAL BLOCK  . TONSILLECTOMY  1984    Prior to Admission medications   Medication Sig Start Date End Date Taking? Authorizing Provider  linaclotide Rolan Lipa) 290 MCG CAPS capsule Take 1 capsule (290 mcg total) by mouth daily before breakfast. 02/22/20 05/22/20  Lin Landsman, MD  ondansetron (ZOFRAN) 4 MG tablet Take  1 tablet (4 mg total) by mouth every 8 (eight) hours as needed for up to 10 doses for nausea or vomiting. 04/04/20  Yes Lucrezia Starch, MD  oxyCODONE-acetaminophen (PERCOCET) 5-325 MG tablet Take 1 tablet by mouth every 8 (eight) hours as needed for up to 5 days for severe pain. 04/04/20 04/09/20 Yes Lucrezia Starch, MD  acetaminophen (TYLENOL) 325 MG tablet Take 650 mg by mouth every 6 (six) hours as needed.     [provider]  albuterol (VENTOLIN HFA) 108  (90 Base) MCG/ACT inhaler Inhale 2 puffs into the lungs every 6 (six) hours as needed for wheezing or shortness of breath. 02/08/20   Steele Sizer, MD  atorvastatin (LIPITOR) 20 MG tablet Take 1 tablet (20 mg total) by mouth daily. 02/08/20   Steele Sizer, MD  buPROPion (WELLBUTRIN XL) 300 MG 24 hr tablet Take 300 mg by mouth every morning. 01/19/20   [provider]  cholecalciferol (VITAMIN D3) 25 MCG (1000 UNIT) tablet Take 1,000 Units by mouth daily.    [provider]  escitalopram (LEXAPRO) 20 MG tablet Take 1 tablet (20 mg total) by mouth daily. 08/06/19   Steele Sizer, MD  Fluticasone-Salmeterol (ADVAIR DISKUS) 100-50 MCG/DOSE AEPB Inhale 1 puff into the lungs in the morning and at bedtime. 02/08/20   Steele Sizer, MD  hydrochlorothiazide (HYDRODIURIL) 25 MG tablet Take 1 tablet (25 mg total) by mouth daily. 02/08/20   Steele Sizer, MD  hydrOXYzine (ATARAX/VISTARIL) 50 MG tablet Take 1 tablet (50 mg total) by mouth 2 (two) times daily. 08/06/19   Steele Sizer, MD  modafinil (PROVIGIL) 200 MG tablet Take 1 tablet (200 mg total) by mouth daily. 02/08/20   Steele Sizer, MD  tiZANidine (ZANAFLEX) 4 MG tablet Take 1 tablet (4 mg total) by mouth every 6 (six) hours as needed for muscle spasms. 03/24/20   Steele Sizer, MD    Allergies Latex  Family History  Problem Relation Age of Onset  . Testicular cancer Brother   . Colon cancer Paternal Grandfather   . Breast cancer Maternal Grandmother   . Hypertension Maternal Grandmother   . Aneurysm Maternal Grandmother   . Stroke Maternal Grandmother   . Obesity Mother   . Diabetes Mother   . High Cholesterol Mother   . Hypertension Mother   . Neuropathy Mother   . Depression Mother   . Anxiety disorder Mother   . Obesity Father   . Hypertension Father   . High Cholesterol Father   . Depression Father   . Anxiety disorder Father   . Hypertension Maternal Grandfather   . High Cholesterol Maternal  Grandfather   . Heart disease Maternal Grandfather        has a pacemaker  . Stroke Maternal Grandfather   . Alzheimer's disease Paternal Grandmother   . Dementia Paternal Grandmother     Social History Social History   Tobacco Use  . Smoking status: Former Smoker    Packs/day: 0.25    Years: 10.00    Pack years: 2.50    Types: Cigarettes    Quit date: 07/05/2014    Years since quitting: 5.7  . Smokeless tobacco: Never Used  Vaping Use  . Vaping Use: Never used  Substance Use Topics  . Alcohol use: Yes    Comment: rare  . Drug use: No    Review of Systems  Review of Systems  Constitutional: Negative for chills and fever.  HENT: Negative for sore throat.   Eyes:  Negative for pain.  Respiratory: Negative for cough and stridor.   Cardiovascular: Negative for chest pain.  Gastrointestinal: Positive for abdominal pain ( RLQ), diarrhea ( chronic), nausea and vomiting.  Genitourinary: Positive for flank pain ( R ).  Musculoskeletal: Positive for back pain ( R lower).  Skin: Negative for rash.  Neurological: Negative for seizures, loss of consciousness and headaches.  Psychiatric/Behavioral: Negative for suicidal ideas.  All other systems reviewed and are negative.     ____________________________________________   PHYSICAL EXAM:  VITAL SIGNS: ED Triage Vitals  Enc Vitals Group     BP 04/04/20 0847 (!) 121/93     Pulse Rate 04/04/20 0846 74     Resp 04/04/20 0846 18     Temp 04/04/20 0846 97.7 F (36.5 C)     Temp Source 04/04/20 0846 Oral     SpO2 04/04/20 0846 98 %     Weight 04/04/20 0847 (!) 322 lb 1.5 oz (146.1 kg)     Height 04/04/20 0847 5\' 5"  (1.651 m)     Head Circumference --      Peak Flow --      Pain Score 04/04/20 0847 6     Pain Loc --      Pain Edu? --      Excl. in [place]? --    Vitals:   04/04/20 1045 04/04/20 1100  BP: 130/86 121/78  Pulse: 75 64  Resp: 18 14  Temp:    SpO2: 100% 95%   Physical Exam Vitals and nursing note reviewed.   Constitutional:      General: She is not in acute distress.    Appearance: She is well-developed. She is obese.  HENT:     Head: Normocephalic and atraumatic.     Right Ear: External ear normal.     Left Ear: External ear normal.     Nose: Nose normal.     Mouth/Throat:     Mouth: Mucous membranes are dry.  Eyes:     Conjunctiva/sclera: Conjunctivae normal.  Cardiovascular:     Rate and Rhythm: Normal rate and regular rhythm.     Pulses: Normal pulses.     Heart sounds: No murmur heard.   Pulmonary:     Effort: Pulmonary effort is normal. No respiratory distress.     Breath sounds: Normal breath sounds.  Abdominal:     Palpations: Abdomen is soft.     Tenderness: There is abdominal tenderness in the right lower quadrant. There is right CVA tenderness. There is no left CVA tenderness.  Musculoskeletal:     Cervical back: Neck supple.  Skin:    General: Skin is warm and dry.     Capillary Refill: Capillary refill takes 2 to 3 seconds.  Neurological:     Mental Status: She is alert and oriented to person, place, and time.  Psychiatric:        Mood and Affect: Mood normal.      ____________________________________________   LABS (all labs ordered are listed, but only abnormal results are displayed)  Labs Reviewed  COMPREHENSIVE METABOLIC PANEL - Abnormal; Notable for the following components:      Result Value   Glucose, Bld 106 (*)    Creatinine, Ser 1.13 (*)    AST 11 (*)    All other components within normal limits  URINALYSIS, COMPLETE (UACMP) WITH MICROSCOPIC - Abnormal; Notable for the following components:   Color, Urine STRAW (*)    APPearance CLEAR (*)  Bacteria, UA RARE (*)    All other components within normal limits  CBC WITH DIFFERENTIAL/PLATELET  LIPASE, BLOOD  PREGNANCY, URINE  POC URINE PREG, ED   ____________________________________________  EKG ____________________________________________  RADIOLOGY  ED MD interpretation: No evidence  of appendicitis, diverticulitis, pyelonephritis and well no clear kidney stone is visualized cannot rule out distal ureteral stone.  Adnexa is unremarkable.  Official radiology report(s): CT Renal Stone Study  Result Date: 04/04/2020 CLINICAL DATA:  40 year old female with right flank pain radiating toward the groin. EXAM: CT ABDOMEN AND PELVIS WITHOUT CONTRAST TECHNIQUE: Multidetector CT imaging of the abdomen and pelvis was performed following the standard protocol without IV contrast. COMPARISON:  Prior CT scan of the abdomen and pelvis 01/13/2017 FINDINGS: Lower chest: No acute abnormality. Hepatobiliary: No focal liver abnormality is seen. No gallstones, gallbladder wall thickening, or biliary dilatation. Pancreas: Unremarkable. No pancreatic ductal dilatation or surrounding inflammatory changes. Spleen: Normal in size without focal abnormality. Adrenals/Urinary Tract: Adrenal glands are unremarkable. Kidneys are normal, without renal calculi, focal lesion, or hydronephrosis. Small radiopacity (image 71 series 2; image 113 series 4) appears just posterior to the right distal ureter and likely represents a venous phlebolith given the absence of associated hydroureter or inflammatory changes. Bladder is unremarkable. Stomach/Bowel: Stomach is within normal limits. Appendix appears normal. No evidence of bowel wall thickening, distention, or inflammatory changes. Vascular/Lymphatic: Limited evaluation in the absence of intravenous contrast. No evidence of aneurysm, atherosclerotic plaque or perivascular inflammatory changes. No suspicious lymphadenopathy. Reproductive: Status post hysterectomy. No adnexal masses. Other: No abdominal wall hernia or abnormality. No abdominopelvic ascites. Musculoskeletal: No acute fracture or aggressive appearing lytic or blastic osseous lesion. IMPRESSION: 1. Small punctate radiopacity in the right anatomic pelvis appears to be just posterior to the distal right ureter and is  favored to represent a venous phlebolith. However, this finding was not present on prior imaging from January of 2019, and a small nonobstructing distal ureteral stone is difficult to exclude entirely. 2. No additional renal or ureteral stones identified. No evidence of hydronephrosis or hydroureter. Electronically Signed   By: Jacqulynn Cadet M.D.   On: 04/04/2020 10:14    ____________________________________________   PROCEDURES  Procedure(s) performed (including Critical Care):  .1-3 Lead EKG Interpretation Performed by: Lucrezia Starch, MD Authorized by: Lucrezia Starch, MD     Interpretation: normal     ECG rate assessment: normal     Rhythm: sinus rhythm     Ectopy: none     Conduction: normal       ____________________________________________   INITIAL IMPRESSION / ASSESSMENT AND PLAN / ED COURSE      Patient presents with above to history exam for assessment of acute onset of right lower back pain rating around the right flank into the right lower quadrant of abdomen associate with some nonbloody nonbilious emesis.  On arrival patient is afebrile and hemodynamically stable.  She is dry appearing and slightly dehydrated with tenderness in her right CVA region and right lower quadrant of her abdomen.  Primary differential includes kidney stone, pyelonephritis, cystitis, cholecystitis, torsion, diverticulitis, pancreatitis and possible MSK.  No historical or exam features to suggest pneumonia or pneumothorax.  Low suspicion for an atypical presentation of PE as patient is PERC negative.  Patient denies any pelvic discomfort abnormal discharge or urinary symptoms lower suspicion for PID at this time.   CBC is unremarkable.  No leukocytosis or acute anemia.  CMP shows no significant electrolyte or metabolic derangements.  No  evidence of hepatitis or cholestasis.  Lipase is not consistent with acute pancreatitis.  Urine pregnancy test is negative.  UA unremarkable.  CT  obtained shows no ovarian enlargement, appendicitis, diverticulitis or other clear acute abdominal pelvic pathology although radiology is unable to exclude a small distal ureteral stone.  They do note she has some venous calcifications.  No evidence of hydroureter or hydronephrosis or perinephric stranding.  Overall given absence of fever elevated white blood cell count Evalose patient for pyelonephritis.  Suspect patient symptoms are likely related to a stone.  Not clearly able to visualize on CT.  She was treated with below noted analgesia and antiemetics on reassessment stated she felt much better.  Given UA does not appear infected kidney function is within normal limits with patient tolerating p.o. and pain is controlled I believe she is safe for discharge with plan for outpatient follow-up.  Discharged stable condition.  Strict return precautions advised and discussed.       ____________________________________________   FINAL CLINICAL IMPRESSION(S) / ED DIAGNOSES  Final diagnoses:  Kidney stone    Medications  lactated ringers bolus 1,000 mL (0 mLs Intravenous Stopped 04/04/20 1039)  ondansetron (ZOFRAN) injection 4 mg (4 mg Intravenous Given 04/04/20 0903)  morphine 4 MG/ML injection 4 mg (4 mg Intravenous Given 04/04/20 0903)  morphine 4 MG/ML injection 4 mg (4 mg Intravenous Given 04/04/20 1039)  ketorolac (TORADOL) 30 MG/ML injection 30 mg (30 mg Intravenous Given 04/04/20 1039)  acetaminophen (TYLENOL) tablet 1,000 mg (1,000 mg Oral Given 04/04/20 1039)     ED Discharge Orders         Ordered    oxyCODONE-acetaminophen (PERCOCET) 5-325 MG tablet  Every 8 hours PRN        04/04/20 1053    ondansetron (ZOFRAN) 4 MG tablet  Every 8 hours PRN        04/04/20 1053           Note:  This document was prepared using Dragon voice recognition software and may include unintentional dictation errors.   Lucrezia Starch, MD 04/04/20 [phone]

## 2020-04-04 NOTE — ED Notes (Signed)
Unable to provide urine sample at this time. Pt verbalizes understanding of need for sample when able to provide.

## 2020-04-04 NOTE — ED Notes (Signed)
Pt has been provided with discharge instructions. Pt denies any questions or concerns at this time. Pt verbalizes understanding for follow up care and d/c.  VSS.  Pt left department with all belongings.  

## 2020-04-04 NOTE — Telephone Encounter (Signed)
pt called with complaints of excruiating right lower side pain x 25 minutes; it radiates to her groin; the pt had 1 episode of dry heaves;the pt describes her pain as "it feels like a brick inside of me"; she says she thought she would feel better if she threw up but only had 1 episode of dry heaves; this did not reliever her pain; the pt says she applied Icy Hot to the site but it did not relieve her pain  her pain is constant and rated 10 out of 10; recommendations made per nurse triage protocol; she verbalized understanding and will go to the ED; the pt is seen by Dr Ancil Boozer, [facility]; will route to office for notification.  Reason for Disposition . Patient sounds very sick or weak to the triager  Answer Assessment - Initial Assessment Questions 1. LOCATION: "Where does it hurt?"      Right lower  2. RADIATION: "Does the pain shoot anywhere else?" (e.g., chest, back)   Radiates to groin 3. ONSET: "When did the pain begin?" (e.g., minutes, hours or days ago)    04/04/20 at 0730 4. SUDDEN: "Gradual or sudden onset?"    sudden 5. PATTERN "Does the pain come and go, or is it constant?"    - If constant: "Is it getting better, staying the same, or worsening?"      (Note: Constant means the pain never goes away completely; most serious pain is constant and it progresses)     - If intermittent: "How long does it last?" "Do you have pain now?"     (Note: Intermittent means the pain goes away completely between bouts)    constant 6. SEVERITY: "How bad is the pain?"  (e.g., Scale 1-10; mild, moderate, or severe)   - MILD (1-3): doesn't interfere with normal activities, abdomen soft and not tender to touch    - MODERATE (4-7): interferes with normal activities or awakens from sleep, tender to touch    - SEVERE (8-10): excruciating pain, doubled over, unable to do any normal activities      severe 7. RECURRENT SYMPTOM: "Have you ever had this type of stomach pain before?" If Yes, ask: "When  was the last time?" and "What happened that time?"     no 8. CAUSE: "What do you think is causing the stomach pain?"     Not sure 9. RELIEVING/AGGRAVATING FACTORS: "What makes it better or worse?" (e.g., movement, antacids, bowel movement)   Nothing makes better 10. OTHER SYMPTOMS: "Has there been any vomiting, diarrhea, constipation, or urine problems?"       Dry heaves x 1 episode 11. PREGNANCY: "Is there any chance you are pregnant?" "When was your last menstrual period?"      No hysterectomy  Protocols used: ABDOMINAL PAIN - [facility]

## 2020-04-07 ENCOUNTER — Telehealth: Payer: Self-pay

## 2020-04-07 NOTE — Telephone Encounter (Signed)
Copied from [place] [phone]. Topic: General - Inquiry >> Apr 07, 2020  9:22 AM Greggory Keen D wrote: Reason for CRM: Pt was in the ER yesterday and diagnosed with a small kidney stone.   They gave her pain medication and told to come back if it got worse and call her primary and let them know  CB#  [phone]

## 2020-04-07 NOTE — Telephone Encounter (Signed)
Copied from [place] [id number]. Topic: General - Other >> Apr 07, 2020  2:29 PM Tessa Lerner A wrote: Reason for CRM: Patient was seen at [facility] on 04/04/20 for urinary discomfort and was told by staff that they were experiencing discomfort related to a kidney stone  Patient was instructed to notify they're PCP if the pain worsens and told that they may potentially need to visit a urologist   Patient would like to be contacted to discuss their discomfort further and be advised on whether or not they should be seen by a urologist  Patient declined to make an office appointment at the time of call with agent   Please contact to further advise

## 2020-04-08 ENCOUNTER — Other Ambulatory Visit: Payer: Self-pay | Admitting: Family Medicine

## 2020-04-08 ENCOUNTER — Other Ambulatory Visit: Payer: Self-pay

## 2020-04-08 DIAGNOSIS — N200 Calculus of kidney: Secondary | ICD-10-CM

## 2020-04-08 MED ORDER — TAMSULOSIN HCL 0.4 MG PO CAPS: 0.4000 mg | ORAL_CAPSULE | Freq: Every day | ORAL | 0 refills | Status: DC

## 2020-04-08 NOTE — Telephone Encounter (Signed)
Pt called asking if Dr. Ancil Boozer could call her in some more pain medication that she got while in the R.  She has not passed the stone yet and only has one pill left.  She uses Paediatric nurse on [address]  CB#  [phone]

## 2020-04-14 ENCOUNTER — Encounter: Payer: Self-pay | Admitting: Urology

## 2020-04-14 ENCOUNTER — Ambulatory Visit (INDEPENDENT_AMBULATORY_CARE_PROVIDER_SITE_OTHER): Payer: 59 | Admitting: Urology

## 2020-04-14 ENCOUNTER — Other Ambulatory Visit: Payer: Self-pay

## 2020-04-14 VITALS — BP 133/83 | HR 65 | Ht 64.5 in | Wt 334.0 lb

## 2020-04-14 DIAGNOSIS — N200 Calculus of kidney: Secondary | ICD-10-CM | POA: Diagnosis not present

## 2020-04-14 DIAGNOSIS — N201 Calculus of ureter: Secondary | ICD-10-CM

## 2020-04-14 LAB — URINALYSIS, COMPLETE
Bilirubin, UA: NEGATIVE
Glucose, UA: NEGATIVE
Ketones, UA: NEGATIVE
Leukocytes,UA: NEGATIVE
Nitrite, UA: NEGATIVE
Protein,UA: NEGATIVE
RBC, UA: NEGATIVE
Specific Gravity, UA: 1.02 (ref 1.005–1.030)
Urobilinogen, Ur: 0.2 mg/dL (ref 0.2–1.0)
pH, UA: 7 (ref 5.0–7.5)

## 2020-04-14 LAB — MICROSCOPIC EXAMINATION
Bacteria, UA: NONE SEEN
RBC, Urine: NONE SEEN /hpf (ref 0–2)
WBC, UA: NONE SEEN /hpf (ref 0–5)

## 2020-04-14 NOTE — Progress Notes (Signed)
04/14/20 9:20 AM   Early L Ikard [birth year] [id number]  CC: Possible ureteral stone, CKD  HPI: I saw Ms. Huckaba today for a possible right distal ureteral stone.  She is a 40 year old female with morbid obesity and a BMI of 56 and no history of kidney stones who presented to the ED on 04/04/2020 with acute onset of severe right-sided flank pain.  A CT showed a possible 4 mm right distal ureteral stone, but could not clearly identify if this was within the ureter or extraluminal.  Notably, the calcification was not present on a CT from 2019.  Urinalysis and lab work were benign in the ED.  She required narcotic pain medication but was discharged home.  She reports that after a few days her pain significantly decreased, and she really denies any complaints today.  She has not been taking the Flomax that was prescribed.  She denies any gross hematuria or urinary symptoms.  She has a strong family history of stones.  Urinalysis today pending  PMH: Past Medical History:  Diagnosis Date  . Anxiety   . Arthritis 2006   rhuematoid - mild - no current issues  . Asthma 2003  . Complication of anesthesia    ran fever after c-section - had infection.  "Usually" has low temp after surgery.  . Hypertension 2013  . Lump or mass in breast 2013   RIGHT BREAST  . Motion sickness    repeated amusement park rides  . Scoliosis    no current issues  . Shortness of breath dyspnea    secondary to weight   . Ulcer 2001    Surgical History: Past Surgical History:  Procedure Laterality Date  . ABDOMINAL HYSTERECTOMY  2008  . BREAST BIOPSY Left 2013   CORE - FIBROADENOMA VS PHYLLODES  . CESAREAN SECTION  2007  . COLONOSCOPY WITH PROPOFOL N/A 10/09/2016   Procedure: COLONOSCOPY WITH PROPOFOL;  Surgeon: Jonathon Bellows, MD;  Location: [facility] ENDOSCOPY;  Service: Gastroenterology;  Laterality: N/A;  . COLONOSCOPY WITH PROPOFOL N/A 11/09/2019   Procedure: COLONOSCOPY WITH PROPOFOL;  Surgeon: Lin Landsman, MD;  Location: [facility] ENDOSCOPY;  Service: Gastroenterology;  Laterality: N/A;  . GUM SURGERY  2004  . PLANTAR FASCIA RELEASE Left 08/19/2014   Procedure: ENDOSCOPIC PLANTAR FASCIOTOMY RELEASE L WITH TOPAZ;  Surgeon: Albertine Patricia, DPM;  Location: [place];  Service: Podiatry;  Laterality: Left;  LMA WITH POPLITEAL BLOCK  . TONSILLECTOMY  1984    Family History: Family History  Problem Relation Age of Onset  . Testicular cancer Brother   . Colon cancer Paternal Grandfather   . Breast cancer Maternal Grandmother   . Hypertension Maternal Grandmother   . Aneurysm Maternal Grandmother   . Stroke Maternal Grandmother   . Obesity Mother   . Diabetes Mother   . High Cholesterol Mother   . Hypertension Mother   . Neuropathy Mother   . Depression Mother   . Anxiety disorder Mother   . Obesity Father   . Hypertension Father   . High Cholesterol Father   . Depression Father   . Anxiety disorder Father   . Hypertension Maternal Grandfather   . High Cholesterol Maternal Grandfather   . Heart disease Maternal Grandfather        has a pacemaker  . Stroke Maternal Grandfather   . Alzheimer's disease Paternal Grandmother   . Dementia Paternal Grandmother     Social History:  reports that she quit smoking about 5 years  ago. Her smoking use included cigarettes. She has a 2.50 pack-year smoking history. She has never used smokeless tobacco. She reports current alcohol use. She reports that she does not use drugs.  Physical Exam: BP 133/83 (BP Location: Left Arm, Patient Position: Sitting, Cuff Size: Large)   Pulse 65   Ht 5' 4.5" (1.638 m)   Wt (!) 334 lb (151.5 kg)   BMI 56.45 kg/m    Constitutional:  Alert and oriented, No acute distress. Cardiovascular: No clubbing, cyanosis, or edema. Respiratory: Normal respiratory effort, no increased work of breathing. GI: Abdomen is soft, nontender, nondistended, no abdominal masses GU: No CVA tenderness  Laboratory  Data: Reviewed, see HPI  Pertinent Imaging: I have personally viewed and interpreted the CT from 04/04/2020 as well as from 01/2017.  There is a 4 mm calcification in the right pelvis that likely is over the projected course of the ureter, but no hydronephrosis.  The calcification is new from 2019.  I do think this represented a right distal ureteral stone.  Assessment & Plan:   40 year old female with morbid obesity who presented to the ED with acute onset of severe right-sided flank pain and CT suggesting a 4 mm right distal ureteral stone with no hydronephrosis.  The calcification on CT was new from 2019, and I do think this represented a small distal ureteral stone.  She has no symptoms today and her pain has almost completely resolved, and she may have already passed her stone.  I recommended continuing another 10 days of Flomax, with close follow-up in 2 weeks for symptom check and renal ultrasound prior to confirm no hydronephrosis in the setting of her CKD.  Continue Flomax, strain urine RTC 2 weeks with renal ultrasound prior If recurrence of severe renal colic, consider right ureteroscopy-risks and benefits discussed extensively  Nickolas Madrid, MD 04/14/2020  [facility] [address], [place], [place] [address] [phone]

## 2020-04-14 NOTE — Patient Instructions (Signed)
Dietary Guidelines to Help Prevent Kidney Stones Kidney stones are deposits of minerals and salts that form inside your kidneys. Your risk of developing kidney stones may be greater depending on your diet, your lifestyle, the medicines you take, and whether you have certain medical conditions. Most people can lower their chances of developing kidney stones by following the instructions below. Your dietitian may give you more specific instructions depending on your overall health and the type of kidney stones you tend to develop. What are tips for following this plan? Reading food labels  Choose foods with "no salt added" or "low-salt" labels. Limit your salt (sodium) intake to less than 1,500 mg a day.  Choose foods with calcium for each meal and snack. Try to eat about 300 mg of calcium at each meal. Foods that contain 200-500 mg of calcium a serving include: ? 8 oz (237 mL) of milk, calcium-fortifiednon-dairy milk, and calcium-fortifiedfruit juice. Calcium-fortified means that calcium has been added to these drinks. ? 8 oz (237 mL) of kefir, yogurt, and soy yogurt. ? 4 oz (114 g) of tofu. ? 1 oz (28 g) of cheese. ? 1 cup (150 g) of dried figs. ? 1 cup (91 g) of cooked broccoli. ? One 3 oz (85 g) can of sardines or mackerel. Most people need 1,000-1,500 mg of calcium a day. Talk to your dietitian about how much calcium is recommended for you.   Shopping  Buy plenty of fresh fruits and vegetables. Most people do not need to avoid fruits and vegetables, even if these foods contain nutrients that may contribute to kidney stones.  When shopping for convenience foods, choose: ? Whole pieces of fruit. ? Pre-made salads with dressing on the side. ? Low-fat fruit and yogurt smoothies.  Avoid buying frozen meals or prepared deli foods. These can be high in sodium.  Look for foods with live cultures, such as yogurt and kefir.  Choose high-fiber grains, such as whole-wheat breads, oat bran, and  wheat cereals. Cooking  Do not add salt to food when cooking. Place a salt shaker on the table and allow each person to add his or her own salt to taste.  Use vegetable protein, such as beans, textured vegetable protein (TVP), or tofu, instead of meat in pasta, casseroles, and soups. Meal planning  Eat less salt, if told by your dietitian. To do this: ? Avoid eating processed or pre-made food. ? Avoid eating fast food.  Eat less animal protein, including cheese, meat, poultry, or fish, if told by your dietitian. To do this: ? Limit the number of times you have meat, poultry, fish, or cheese each week. Eat a diet free of meat at least 2 days a week. ? Eat only one serving each day of meat, poultry, fish, or seafood. ? When you prepare animal protein, cut pieces into small portion sizes. For most meat and fish, one serving is about the size of the palm of your hand.  Eat at least five servings of fresh fruits and vegetables each day. To do this: ? Keep fruits and vegetables on hand for snacks. ? Eat one piece of fruit or a handful of berries with breakfast. ? Have a salad and fruit at lunch. ? Have two kinds of vegetables at dinner.  Limit foods that are high in a substance called oxalate. These include: ? Spinach (cooked), rhubarb, beets, sweet potatoes, and Swiss chard. ? Peanuts. ? Potato chips, french fries, and baked potatoes with skin on. ? Nuts and  nut products. ? Chocolate.  If you regularly take a diuretic medicine, make sure to eat at least 1 or 2 servings of fruits or vegetables that are high in potassium each day. These include: ? Avocado. ? Banana. ? Orange, prune, carrot, or tomato juice. ? Baked potato. ? Cabbage. ? Beans and split peas. Lifestyle  Drink enough fluid to keep your urine pale yellow. This is the most important thing you can do. Spread your fluid intake throughout the day.  If you drink alcohol: ? Limit how much you use to:  0-1 drink a day for  women who are not pregnant.  0-2 drinks a day for men. ? Be aware of how much alcohol is in your drink. In the [place] one drink equals one 12 oz bottle of beer (355 mL), one 5 oz glass of wine (148 mL), or one 1 oz glass of hard liquor (44 mL).  Lose weight if told by your health care provider. Work with your dietitian to find an eating plan and weight loss strategies that work best for you.   General information  Talk to your health care provider and dietitian about taking daily supplements. You may be told the following depending on your health and the cause of your kidney stones: ? Not to take supplements with vitamin C. ? To take a calcium supplement. ? To take a daily probiotic supplement. ? To take other supplements such as magnesium, fish oil, or vitamin B6.  Take over-the-counter and prescription medicines only as told by your health care provider. These include supplements. What foods should I limit? Limit your intake of the following foods, or eat them as told by your dietitian. Vegetables Spinach. Rhubarb. Beets. Canned vegetables. Angie Fava. Olives. Baked potatoes with skin. Grains Wheat bran. Baked goods. Salted crackers. Cereals high in sugar. Meats and other proteins Nuts. Nut butters. Large portions of meat, poultry, or fish. Salted, precooked, or cured meats, such as sausages, meat loaves, and hot dogs. Dairy Cheese. Beverages Regular soft drinks. Regular vegetable juice. Seasonings and condiments Seasoning blends with salt. Salad dressings. Soy sauce. Ketchup. Barbecue sauce. Other foods Canned soups. Canned pasta sauce. Casseroles. Pizza. Lasagna. Frozen meals. Potato chips. [place] fries. The items listed above may not be a complete list of foods and beverages you should limit. Contact a dietitian for more information. What foods should I avoid? Talk to your dietitian about specific foods you should avoid based on the type of kidney stones you have and your overall  health. Fruits Grapefruit. The item listed above may not be a complete list of foods and beverages you should avoid. Contact a dietitian for more information. Summary  Kidney stones are deposits of minerals and salts that form inside your kidneys.  You can lower your risk of kidney stones by making changes to your diet.  The most important thing you can do is drink enough fluid. Drink enough fluid to keep your urine pale yellow.  Talk to your dietitian about how much calcium you should have each day, and eat less salt and animal protein as told by your dietitian. This information is not intended to replace advice given to you by your health care provider. Make sure you discuss any questions you have with your health care provider. Document Revised: 12/18/2018 Document Reviewed: 12/18/2018 Elsevier Patient Education  2021 [employer].

## 2020-04-15 ENCOUNTER — Telehealth: Payer: Self-pay

## 2020-04-15 NOTE — Telephone Encounter (Signed)
Patient left a vmail on the triage line stating that after her visit in the office yesterday she started to experience severe pain. Attempted to return patient's call, left a vmail on work phone per message request

## 2020-04-15 NOTE — Telephone Encounter (Signed)
Patient called back stating that she is feeling much better this morning. Her sudden onset of pain yesterday has passed. She was advised to use OTC NSAIDS to help with pain control if this happens again and continue increased water intake and Flomax. If her pain is uncontrolled by OTC NSAIDS she should contact the office or go to the ED for severe pain if after office hours. Patient verbalized understanding.

## 2020-04-18 ENCOUNTER — Telehealth: Payer: Self-pay

## 2020-04-18 NOTE — Telephone Encounter (Signed)
Copied from [place] [id number]. Topic: General - Other >> Apr 18, 2020 10:22 AM Leward Quan A wrote: Reason for CRM: Patient called in to inform nurse that called her she had a sleep study done at least within the last 2 years. Need to get a prior authorization with modafinil (PROVIGIL) 200 MG tablet please call her between 8 AM to 5 PM at Ph# [phone]

## 2020-04-18 NOTE — Telephone Encounter (Signed)
Left voice mail

## 2020-04-19 ENCOUNTER — Telehealth (HOSPITAL_COMMUNITY): Payer: Self-pay

## 2020-04-22 ENCOUNTER — Encounter: Payer: Self-pay | Admitting: Family Medicine

## 2020-04-28 ENCOUNTER — Ambulatory Visit: Payer: Self-pay | Admitting: Urology

## 2020-05-12 ENCOUNTER — Other Ambulatory Visit: Payer: Self-pay

## 2020-05-12 ENCOUNTER — Ambulatory Visit
Admission: RE | Admit: 2020-05-12 | Discharge: 2020-05-12 | Disposition: A | Discharge status: Home / Self Care | Payer: 59 | Source: Ambulatory Visit | Attending: Urology | Admitting: Urology

## 2020-05-12 DIAGNOSIS — N200 Calculus of kidney: Secondary | ICD-10-CM | POA: Insufficient documentation

## 2020-05-26 ENCOUNTER — Other Ambulatory Visit: Payer: Self-pay

## 2020-05-26 ENCOUNTER — Ambulatory Visit (INDEPENDENT_AMBULATORY_CARE_PROVIDER_SITE_OTHER): Payer: 59 | Admitting: Urology

## 2020-05-26 ENCOUNTER — Encounter: Payer: Self-pay | Admitting: Urology

## 2020-05-26 VITALS — BP 133/74 | HR 71 | Ht 64.0 in | Wt 325.0 lb

## 2020-05-26 DIAGNOSIS — N200 Calculus of kidney: Secondary | ICD-10-CM | POA: Diagnosis not present

## 2020-05-26 NOTE — Patient Instructions (Signed)

## 2020-05-26 NOTE — Progress Notes (Signed)
   05/26/2020 8:28 AM   Decarla L Lamos [birth year] [id number]  Reason for visit: Follow up right distal ureteral stone  HPI: 40 year old female with morbid obesity and BMI of 56 who previously presented in early April 2022 with severe right-sided flank pain and CT showing a suspected 4 mm right distal ureteral stone.  Her pain resolved within a few days.  We opted for a follow-up renal ultrasound.  I personally viewed and interpreted the ultrasound dated 05/15/2020 that shows no hydronephrosis or evidence of stones.  She denies any problems over the last few weeks.  Specifically denies any flank pain, urinary symptoms, or nausea.  We discussed general stone prevention strategies including adequate hydration with goal of producing 2.5 L of urine daily, increasing citric acid intake, increasing calcium intake during high oxalate meals, minimizing animal protein, and decreasing salt intake. Information about dietary recommendations given today.   Follow-up with urology as needed.  If recurrence of flank pain would recommend UA and likely CT stone protocol    Billey Co, MD  [facility] [address], [place], [place] [address] [phone]

## 2020-05-30 ENCOUNTER — Ambulatory Visit: Payer: Self-pay | Admitting: *Deleted

## 2020-05-30 NOTE — Telephone Encounter (Signed)
Pt reports SOB with minimal exertion, onset yesterday. Also reports sneezing and dry cough. States at home covid test negative. H/O asthma, is using albuterol inhaler "I don't usually use it and I am now quite often, it does help." Also using Advair. Per protocol advised UC as not able to be seen within 4 hours. Pt states "I just wondered what I could do at home." Assured pt NT would route to PCP for review. Aware after hours call.  Advised ED if symptoms worsen. Pt verbalizes understanding. CB# [phone]  Reason for Disposition . [1] MILD difficulty breathing (e.g., minimal/no SOB at rest, SOB with walking, pulse <100) AND [2] NEW-onset or WORSE than normal  Answer Assessment - Initial Assessment Questions 1. RESPIRATORY STATUS: "Describe your breathing?" (e.g., wheezing, shortness of breath, unable to speak, severe coughing)      With exertion 2. ONSET: "When did this breathing problem begin?"      yesterday 3. PATTERN "Does the difficult breathing come and go, or has it been constant since it started?"      Comes and goes 4. SEVERITY: "How bad is your breathing?" (e.g., mild, moderate, severe)    - MILD: No SOB at rest, mild SOB with walking, speaks normally in sentences, can lie down, no retractions, pulse < 100.    - MODERATE: SOB at rest, SOB with minimal exertion and prefers to sit, cannot lie down flat, speaks in phrases, mild retractions, audible wheezing, pulse 100-120.    - SEVERE: Very SOB at rest, speaks in single words, struggling to breathe, sitting hunched forward, retractions, pulse > 120      Mild to moderate, varies 5. RECURRENT SYMPTOM: "Have you had difficulty breathing before?" If Yes, ask: "When was the last time?" and "What happened that time?"      At times with asthma 6. CARDIAC HISTORY: "Do you have any history of heart disease?" (e.g., heart attack, angina, bypass surgery, angioplasty)       7. LUNG HISTORY: "Do you have any history of lung disease?"  (e.g.,  pulmonary embolus, asthma, emphysema)     asthma 8. CAUSE: "What do you think is causing the breathing problem?"      "Maybe allergies" 9. OTHER SYMPTOMS: "Do you have any other symptoms? (e.g., dizziness, runny nose, cough, chest pain, fever)     Dry cough, sneezing, 10. O2 SATURATION MONITOR:  "Do you use an oxygen saturation monitor (pulse oximeter) at home?" If Yes, "What is your reading (oxygen level) today?" "What is your usual oxygen saturation reading?" (e.g., 95%)       NA  Protocols used: BREATHING DIFFICULTY-A-AH

## 2020-05-31 ENCOUNTER — Ambulatory Visit (INDEPENDENT_AMBULATORY_CARE_PROVIDER_SITE_OTHER): Payer: 59 | Admitting: Family Medicine

## 2020-05-31 VITALS — Ht 64.0 in | Wt 325.0 lb

## 2020-05-31 DIAGNOSIS — R059 Cough, unspecified: Secondary | ICD-10-CM

## 2020-05-31 DIAGNOSIS — R6883 Chills (without fever): Secondary | ICD-10-CM | POA: Diagnosis not present

## 2020-05-31 DIAGNOSIS — R0602 Shortness of breath: Secondary | ICD-10-CM

## 2020-05-31 DIAGNOSIS — J4521 Mild intermittent asthma with (acute) exacerbation: Secondary | ICD-10-CM

## 2020-05-31 MED ORDER — BENZONATATE 100 MG PO CAPS: 100.0000 mg | ORAL_CAPSULE | Freq: Two times a day (BID) | ORAL | 0 refills | Status: DC | PRN

## 2020-05-31 NOTE — Telephone Encounter (Signed)
lvm for pt to call and schedule an appt °

## 2020-05-31 NOTE — Progress Notes (Signed)
Name: Barbara Pennington   MRN: [id number]    DOB: 09-Aug-1980   Date:05/31/2020       Progress Note  Subjective  Chief Complaint  Chief Complaint  Patient presents with  . Cough  . Shortness of Breath    I connected with  Barbara Pennington  on 05/31/20 at  3:40 PM EDT by a video enabled telemedicine application and verified that I am speaking with the correct person using two identifiers.  I discussed the limitations of evaluation and management by telemedicine and the availability of in person appointments. The patient expressed understanding and agreed to proceed with the virtual visit  Staff also discussed with the patient that there may be a patient responsible charge related to this service. Patient Location: at home  Provider Location: [facility] Additional Individuals present:alone   HPI  Symptoms started this past weekend, on the early evening of 05/29/2020. She had an in home test for COVID on Monday the 23 but is still having fatigue, SOB, dry cough, some body aches, but has fatigue, scratchy throat, some sneezing. She has chills but no fever. She has some diarrhea  . Denies nausea or vomiting, denies lack of sense of taste of smell. She has asthma and has been using it about twice daily She uses Advair daily She has been taking DayQuil severe cold and flu  She had COVID-19 vaccines. Three doses total.   She has risk factors for complications of COVID such as obesit, asthma, CKI , OSA   She usually only uses Advair once daily and advised to go up to twice daily   Patient Active Problem List   Diagnosis Date Noted  . Moderate episode of recurrent major depressive disorder ([place]) 02/08/2020  . CKD (chronic kidney disease) stage 3, GFR 30-59 ml/min (HCC) 06/11/2019  . PTSD (post-traumatic stress disorder) 01/14/2019  . Borderline personality disorder ([place]) 01/14/2019  . Anxiety 01/14/2019  . Mixed hyperlipidemia 01/14/2019  . Morbid obesity with BMI of 50.0-59.9, adult ([place]) 01/14/2019   . OSA on CPAP 01/14/2019  . Asthma 04/30/2018  . Low back pain 08/20/2017  . 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 Mastitis, left, acute 12/20/2016  . Essential hypertension     Past Surgical History:  Procedure Laterality Date  . ABDOMINAL HYSTERECTOMY  2008  . BREAST BIOPSY Left 2013   CORE - FIBROADENOMA VS PHYLLODES  . CESAREAN SECTION  2007  . COLONOSCOPY WITH PROPOFOL N/A 10/09/2016   Procedure: COLONOSCOPY WITH PROPOFOL;  Surgeon: Jonathon Bellows, MD;  Location: [facility] ENDOSCOPY;  Service: Gastroenterology;  Laterality: N/A;  . COLONOSCOPY WITH PROPOFOL N/A 11/09/2019   Procedure: COLONOSCOPY WITH PROPOFOL;  Surgeon: Lin Landsman, MD;  Location: [facility] ENDOSCOPY;  Service: Gastroenterology;  Laterality: N/A;  . GUM SURGERY  2004  . PLANTAR FASCIA RELEASE Left 08/19/2014   Procedure: ENDOSCOPIC PLANTAR FASCIOTOMY RELEASE L WITH TOPAZ;  Surgeon: Albertine Patricia, DPM;  Location: [place];  Service: Podiatry;  Laterality: Left;  LMA WITH POPLITEAL BLOCK  . TONSILLECTOMY  1984    Family History  Problem Relation Age of Onset  . Testicular cancer Brother   . Colon cancer Paternal Grandfather   . Breast cancer Maternal Grandmother   . Hypertension Maternal Grandmother   . Aneurysm Maternal Grandmother   . Stroke Maternal Grandmother   . Obesity Mother   . Diabetes Mother   . High Cholesterol Mother   . Hypertension Mother   . Neuropathy Mother   . Depression  Mother   . Anxiety disorder Mother   . Obesity Father   . Hypertension Father   . High Cholesterol Father   . Depression Father   . Anxiety disorder Father   . Hypertension Maternal Grandfather   . High Cholesterol Maternal Grandfather   . Heart disease Maternal Grandfather        has a pacemaker  . Stroke Maternal Grandfather   . Alzheimer's disease Paternal Grandmother   . Dementia Paternal Grandmother     Social History   Socioeconomic History  . Marital status: Divorced    Spouse name:  Not on file  . Number of children: 2  . Years of education: 2  . Highest education level: Bachelor's degree (e.g., BA, AB, BS)  Occupational History  . Not on file  Tobacco Use  . Smoking status: Former Smoker    Packs/day: 0.25    Years: 10.00    Pack years: 2.50    Types: Cigarettes    Quit date: 07/05/2014    Years since quitting: 5.9  . Smokeless tobacco: Never Used  Vaping Use  . Vaping Use: Never used  Substance and Sexual Activity  . Alcohol use: Yes    Comment: rare  . Drug use: No  . Sexual activity: Not Currently    Partners: Male    Birth control/protection: Surgical    Comment: 2007  Other Topics Concern  . Not on file  Social History Narrative  . Not on file   Social Determinants of Health   Financial Resource Strain: High Risk  . Difficulty of Paying Living Expenses: Hard  Food Insecurity: No Food Insecurity  . Worried About Charity fundraiser in the Last Year: Never true  . Ran Out of Food in the Last Year: Never true  Transportation Needs: No Transportation Needs  . Lack of Transportation (Medical): No  . Lack of Transportation (Non-Medical): No  Physical Activity: Unknown  . Days of Exercise per Week: 0 days  . Minutes of Exercise per Session: Not on file  Stress: Stress Concern Present  . Feeling of Stress : To some extent  Social Connections: Socially Isolated  . Frequency of Communication with Friends and Family: More than three times a week  . Frequency of Social Gatherings with Friends and Family: Never  . Attends Religious Services: Never  . Active Member of Clubs or Organizations: No  . Attends Archivist Meetings: Not on file  . Marital Status: Divorced  Human resources officer Violence: Not on file     Current Outpatient Medications:  .  acetaminophen (TYLENOL) 325 MG tablet, Take 650 mg by mouth every 6 (six) hours as needed. , Disp: , Rfl:  .  albuterol (VENTOLIN HFA) 108 (90 Base) MCG/ACT inhaler, Inhale 2 puffs into the lungs  every 6 (six) hours as needed for wheezing or shortness of breath., Disp: 18 g, Rfl: 0 .  atorvastatin (LIPITOR) 20 MG tablet, Take 1 tablet (20 mg total) by mouth daily., Disp: 90 tablet, Rfl: 1 .  buPROPion (WELLBUTRIN XL) 300 MG 24 hr tablet, Take 300 mg by mouth every morning., Disp: , Rfl:  .  cholecalciferol (VITAMIN D3) 25 MCG (1000 UNIT) tablet, Take 1,000 Units by mouth daily., Disp: , Rfl:  .  escitalopram (LEXAPRO) 20 MG tablet, Take 1 tablet (20 mg total) by mouth daily., Disp: 30 tablet, Rfl: 0 .  Fluticasone-Salmeterol (ADVAIR DISKUS) 100-50 MCG/DOSE AEPB, Inhale 1 puff into the lungs in the morning and at  bedtime., Disp: 60 each, Rfl: 3 .  hydrochlorothiazide (HYDRODIURIL) 25 MG tablet, Take 1 tablet (25 mg total) by mouth daily., Disp: 90 tablet, Rfl: 1 .  hydrOXYzine (ATARAX/VISTARIL) 50 MG tablet, Take 1 tablet (50 mg total) by mouth 2 (two) times daily., Disp: 60 tablet, Rfl: 5 .  modafinil (PROVIGIL) 200 MG tablet, Take 1 tablet (200 mg total) by mouth daily., Disp: 30 tablet, Rfl: 3 .  tiZANidine (ZANAFLEX) 4 MG tablet, Take 1 tablet (4 mg total) by mouth every 6 (six) hours as needed for muscle spasms., Disp: 30 tablet, Rfl: 0 .  linaclotide (LINZESS) 290 MCG CAPS capsule, Take 1 capsule (290 mcg total) by mouth daily before breakfast., Disp: 90 capsule, Rfl: 1 .  tamsulosin (FLOMAX) 0.4 MG CAPS capsule, Take 1 capsule (0.4 mg total) by mouth daily. For kidney stone, helps with flow, not pain pill (Patient not taking: Reported on 05/31/2020), Disp: 30 capsule, Rfl: 0  Allergies  Allergen Reactions  . Latex Rash    I personally reviewed active problem list, medication list, allergies, family history, social history with the patient/caregiver today.   ROS  Ten systems reviewed and is negative except as mentioned in HPI   Objective  Virtual encounter, vitals not obtained.  Body mass index is 55.79 kg/m.  Physical Exam  Awake, alert and  oriented  PHQ2/9: Depression screen [facility] 2/9 05/31/2020 02/08/2020 11/24/2019 09/29/2019 08/06/2019  Decreased Interest 0 1 2 2 1   Down, Depressed, Hopeless 0 0 - 1 1  PHQ - 2 Score 0 1 2 3 2   Altered sleeping 1 1 3  - 2  Tired, decreased energy 1 1 2  - 2  Change in appetite 0 3 2 - 1  Feeling bad or failure about yourself  0 0 0 - 0  Trouble concentrating 0 2 2 - 2  Moving slowly or fidgety/restless 0 0 1 - 0  Suicidal thoughts 0 0 0 - 0  PHQ-9 Score 2 8 12  - 9  Difficult doing work/chores Not difficult at all - Somewhat difficult - Somewhat difficult  Some recent data might be hidden   PHQ-2/9 Result is negative.    Fall Risk: Fall Risk  05/31/2020 02/08/2020 09/29/2019 08/06/2019 01/14/2019  Falls in the past year? 0 1 1 0 0  Number falls in past yr: 0 1 1 0 0  Injury with Fall? 0 1 0 0 0  Follow up Falls prevention discussed - Falls evaluation completed - Falls evaluation completed     Assessment & Plan  1. SOB (shortness of breath)  - Novel Coronavirus, NAA ([facility]) Advised to get pulse oximeter and go to [facility] if drops below 90 % Also discussed avoid laying flat, stay hydrated  2. Cough  - Novel Coronavirus, NAA ([facility]) - benzonatate (TESSALON) 100 MG capsule; Take 1 capsule (100 mg total) by mouth 2 (two) times daily as needed for cough.  Dispense: 40 capsule; Refill: 0  3. Chills  - Novel Coronavirus, NAA ([facility]) navirus, NAA ([facility])  4. Mild intermittent asthma with acute exacerbation  We will increase advair to bid and consider prednisone pending COVID results and symptom progression   I discussed the assessment and treatment plan with the patient. The patient was provided an opportunity to ask questions and all were answered. The patient agreed with the plan and demonstrated an understanding of the instructions.  The patient was advised to call back or seek an in-person evaluation if the symptoms worsen or if the condition fails  to improve as anticipated.  I  provided 25  minutes of non-face-to-face time during this encounter.

## 2020-06-03 ENCOUNTER — Ambulatory Visit: Payer: Self-pay | Admitting: *Deleted

## 2020-06-03 ENCOUNTER — Telehealth: Payer: Self-pay | Admitting: Family Medicine

## 2020-06-03 ENCOUNTER — Telehealth: Payer: 59 | Admitting: Physician Assistant

## 2020-06-03 ENCOUNTER — Encounter: Payer: Self-pay | Admitting: Physician Assistant

## 2020-06-03 DIAGNOSIS — J4541 Moderate persistent asthma with (acute) exacerbation: Secondary | ICD-10-CM | POA: Diagnosis not present

## 2020-06-03 LAB — NOVEL CORONAVIRUS, NAA: SARS-CoV-2, NAA: NOT DETECTED

## 2020-06-03 LAB — SARS-COV-2, NAA 2 DAY TAT

## 2020-06-03 MED ORDER — PREDNISONE 20 MG PO TABS: 40.0000 mg | ORAL_TABLET | Freq: Every day | ORAL | 0 refills | Status: DC

## 2020-06-03 NOTE — Telephone Encounter (Signed)
Pt is calling to receive her negative COVID test results. CB- [phone]

## 2020-06-03 NOTE — Progress Notes (Signed)
Ms. Barbara, Pennington are scheduled for a virtual visit with your provider today.    Just as we do with appointments in the office, we must obtain your consent to participate.  Your consent will be active for this visit and any virtual visit you may have with one of our providers in the next 365 days.    If you have a MyChart account, I can also send a copy of this consent to you electronically.  All virtual visits are billed to your insurance company just like a traditional visit in the office.  As this is a virtual visit, video technology does not allow for your provider to perform a traditional examination.  This may limit your provider's ability to fully assess your condition.  If your provider identifies any concerns that need to be evaluated in person or the need to arrange testing such as labs, EKG, etc, we will make arrangements to do so.    Although advances in technology are sophisticated, we cannot ensure that it will always work on either your end or our end.  If the connection with a video visit is poor, we may have to switch to a telephone visit.  With either a video or telephone visit, we are not always able to ensure that we have a secure connection.   I need to obtain your verbal consent now.   Are you willing to proceed with your visit today?   Barbara Pennington has provided verbal consent on 06/03/2020 for a virtual visit (video or telephone).  Leeanne Rio, PA-C 06/03/2020  5:40 PM  Virtual Visit via Video   I connected with patient on 06/03/20 at  5:45 PM EDT by a video enabled telemedicine application and verified that I am speaking with the correct person using two identifiers.  Location patient: Home Location provider: [place] participating in the virtual visit: Patient, Provider  I discussed the limitations of evaluation and management by telemedicine and the availability of in person appointments. The patient expressed understanding and agreed to  proceed.  Subjective:   HPI:  Patient presents via [place] today to discuss continued chest tightness and shortness of breath.  Patient with history of essential hypertension, chronic kidney disease, anxiety, moderate persistent asthma and OSA on CPAP therapy.  Notes this past Saturday night into Sunday morning she started to feel slightly bad.  Initially thought was just mild allergy symptoms as she and her boyfriend return from a trip to [facility] the week before.  States on Monday she started feeling quite rough with significant fatigue, cough, scratchy throat and worsening chest tightness.  As such she took a home COVID test which was negative.  Symptoms continue so she was evaluated by her primary care provider via video visit on 05/29/2020.  At that time provider had concern of possible asthma exacerbation versus COVID.  Was sent for a repeat test (PCR).  That result came back today and was negative.  Patient endorses continued symptoms.  Cough is in spells and mainly dry.  Denies fever, chills, sinus pain, ear pain or tooth pain.  Some scratchy throat.  Denies any chest pain or overt shortness of breath but feels that she her chest is tight and she cannot get a "full breath".  She notes she has increased her Advair per her primary care provider's instructions.  Has not noticed a substantial change in symptoms but is only been doing this a couple days.  Is also taking DayQuil  and NyQuil OTC along with Mucinex.  Patient endorses being very compliant with her CPAP machine.  Of note patient was instructed by her primary care provider to get a home pulse oximeter to keep a check on oxygen.  She notes she has not done this.    ROS:   See pertinent positives and negatives per HPI.  Patient Active Problem List   Diagnosis Date Noted  . Moderate episode of recurrent major depressive disorder ([place]) 02/08/2020  . CKD (chronic kidney disease) stage 3, GFR 30-59 ml/min (HCC) 06/11/2019  . PTSD  (post-traumatic stress disorder) 01/14/2019  . Borderline personality disorder ([place]) 01/14/2019  . Anxiety 01/14/2019  . Mixed hyperlipidemia 01/14/2019  . Morbid obesity with BMI of 50.0-59.9, adult ([place]) 01/14/2019  . OSA on CPAP 01/14/2019  . Asthma 04/30/2018  . Low back pain 08/20/2017  . 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 Mastitis, left, acute 12/20/2016  . Essential hypertension     Social History   Tobacco Use  . Smoking status: Former Smoker    Packs/day: 0.25    Years: 10.00    Pack years: 2.50    Types: Cigarettes    Quit date: 07/05/2014    Years since quitting: 5.9  . Smokeless tobacco: Never Used  Substance Use Topics  . Alcohol use: Yes    Comment: rare    Current Outpatient Medications:  .  acetaminophen (TYLENOL) 325 MG tablet, Take 650 mg by mouth every 6 (six) hours as needed. , Disp: , Rfl:  .  albuterol (VENTOLIN HFA) 108 (90 Base) MCG/ACT inhaler, Inhale 2 puffs into the lungs every 6 (six) hours as needed for wheezing or shortness of breath., Disp: 18 g, Rfl: 0 .  atorvastatin (LIPITOR) 20 MG tablet, Take 1 tablet (20 mg total) by mouth daily., Disp: 90 tablet, Rfl: 1 .  benzonatate (TESSALON) 100 MG capsule, Take 1 capsule (100 mg total) by mouth 2 (two) times daily as needed for cough., Disp: 40 capsule, Rfl: 0 .  buPROPion (WELLBUTRIN XL) 300 MG 24 hr tablet, Take 300 mg by mouth every morning., Disp: , Rfl:  .  cholecalciferol (VITAMIN D3) 25 MCG (1000 UNIT) tablet, Take 1,000 Units by mouth daily., Disp: , Rfl:  .  escitalopram (LEXAPRO) 20 MG tablet, Take 1 tablet (20 mg total) by mouth daily., Disp: 30 tablet, Rfl: 0 .  Fluticasone-Salmeterol (ADVAIR DISKUS) 100-50 MCG/DOSE AEPB, Inhale 1 puff into the lungs in the morning and at bedtime., Disp: 60 each, Rfl: 3 .  hydrochlorothiazide (HYDRODIURIL) 25 MG tablet, Take 1 tablet (25 mg total) by mouth daily., Disp: 90 tablet, Rfl: 1 .  hydrOXYzine (ATARAX/VISTARIL) 50 MG tablet, Take 1 tablet (50  mg total) by mouth 2 (two) times daily., Disp: 60 tablet, Rfl: 5 .  linaclotide (LINZESS) 290 MCG CAPS capsule, Take 1 capsule (290 mcg total) by mouth daily before breakfast., Disp: 90 capsule, Rfl: 1 .  modafinil (PROVIGIL) 200 MG tablet, Take 1 tablet (200 mg total) by mouth daily., Disp: 30 tablet, Rfl: 3 .  tamsulosin (FLOMAX) 0.4 MG CAPS capsule, Take 1 capsule (0.4 mg total) by mouth daily. For kidney stone, helps with flow, not pain pill (Patient not taking: Reported on 05/31/2020), Disp: 30 capsule, Rfl: 0 .  tiZANidine (ZANAFLEX) 4 MG tablet, Take 1 tablet (4 mg total) by mouth every 6 (six) hours as needed for muscle spasms., Disp: 30 tablet, Rfl: 0  Allergies  Allergen Reactions  . Latex Rash  Objective:   There were no vitals taken for this visit.  Patient is well-developed, well-nourished in no acute distress.  Resting comfortably at home.  Head is normocephalic, atraumatic.  No labored breathing.  No audible wheezing. Speech is clear and coherent with logical content.  Patient is alert and oriented at baseline.  Mildly anxious affect.  Assessment and Plan:   1. Moderate persistent asthma with exacerbation Five days of chest tightness mild shortness of breath preceded by viral syndrome.  COVID test negative x2, including PCR test.  Could potentially have had a slightly atypical influenza as there are still currently cases in the area.  Outside the window of antiviral treatment for this.  Continue supportive measures and OTC medications for mild URI symptoms.  We will have her continue her Advair twice daily dosing for now, using albuterol inhaler as directed when needed.  We will start burst of prednisone 40 mg daily x5 days.  Discussed with her that as this is truly an asthma exacerbation as expected she should notice substantial improvement with the prednisone.  If symptoms are not starting to significantly improve over the next 24 to 48 hours, anything worsens or new  symptoms develop, she is to be evaluated in person ASAP.  Also discussed with her the importance of getting a pulse oximeter so we can keep an eye on her oxygen saturations.  If getting anything 90% or less she is to seek in person evaluation at the emergency department.  Patient voiced understanding and agreement with this plan.  Written instructions were sent to the patient via [place].  A copy of this note will be sent to patient's PCP upon closure of the encounter.    Leeanne Rio, [place] 06/03/2020

## 2020-06-03 NOTE — Telephone Encounter (Signed)
Patient is aware of negative COVID result-see triage notes.

## 2020-06-03 NOTE — Patient Instructions (Signed)
Instructions sent to patients MyChart.

## 2020-06-03 NOTE — Telephone Encounter (Signed)
Pt is calling to report that is still not feel good. Pts COVID test was negative - still coughing, congestion, loosing voice, sore throat - Pt is wanting to know what she can take? Pt is still taking the benzonatate (TESSALON) 100 MG capsule [[id number]] .   Patient is calling to report she is still having cough and congestion, fatigue, mild SOB- she has not seen a big improvement-her COVID test was negative.Patient is using Mucinex, dayquil and benzonate. Patient advised per protocol- virtual visit/UC. Provider note states if no better may need prednisone for symptoms. Patient is going to make virtual visit appointment for symptom follow up.   Reason for Disposition . [1] MILD difficulty breathing (e.g., minimal/no SOB at rest, SOB with walking, pulse <100) AND [2] still present when not coughing  Answer Assessment - Initial Assessment Questions 1. ONSET: "When did the cough begin?"      1 week 2. SEVERITY: "How bad is the cough today?"      Patient gets scratch in throat, spells, coughs at night 3. SPUTUM: "Describe the color of your sputum" (none, dry cough; clear, white, yellow, green)     none 4. HEMOPTYSIS: "Are you coughing up any blood?" If so ask: "How much?" (flecks, streaks, tablespoons, etc.)     no 5. DIFFICULTY BREATHING: "Are you having difficulty breathing?" If Yes, ask: "How bad is it?" (e.g., mild, moderate, severe)    - MILD: No SOB at rest, mild SOB with walking, speaks normally in sentences, can lie down, no retractions, pulse < 100.    - MODERATE: SOB at rest, SOB with minimal exertion and prefers to sit, cannot lie down flat, speaks in phrases, mild retractions, audible wheezing, pulse 100-120.    - SEVERE: Very SOB at rest, speaks in single words, struggling to breathe, sitting hunched forward, retractions, pulse > 120      Mild/moderate- not able to fill lungs properly 6. FEVER: "Do you have a fever?" If Yes, ask: "What is your temperature, how was it measured, and when  did it start?"     No- 98.1 7. CARDIAC HISTORY: "Do you have any history of heart disease?" (e.g., heart attack, congestive heart failure)      no 8. LUNG HISTORY: "Do you have any history of lung disease?"  (e.g., pulmonary embolus, asthma, emphysema)     Asthma, sleep apnea 9. PE RISK FACTORS: "Do you have a history of blood clots?" (or: recent major surgery, recent prolonged travel, bedridden)     no 10. OTHER SYMPTOMS: "Do you have any other symptoms?" (e.g., runny nose, wheezing, chest pain)       Fatigue, cough, congestion 11. PREGNANCY: "Is there any chance you are pregnant?" "When was your last menstrual period?"       n/a 12. TRAVEL: "Have you traveled out of the country in the last month?" (e.g., travel history, exposures)       no  Protocols used: COUGH - ACUTE NON-PRODUCTIVE-A-AH

## 2020-06-07 NOTE — Telephone Encounter (Signed)
Pt has an appt on 06/08/20

## 2020-06-07 NOTE — Progress Notes (Signed)
Name: Barbara Pennington   MRN: [id number]    DOB: 12-16-1980   Date:06/08/2020       Progress Note  Subjective  Chief Complaint  Follow Up  HPI  SOB: she developed cold symptoms about 10 days ago, her boyfriend had similar symptoms. Home COVID-19 test negative, she had a virtual visit with [name] on 05/24 because she was still having scratchy throat, fatigue, sneezing, dry cough, body aches, fatigue and SOB. She was using her Advair and otc cold medications without much help. Repeat PCR test in our office was negative. She had a virtual urgent care visit on May 27 th, she was given 5 days of prednisone but still having SOB, scratchy throat, chest tightness, body aches and fatigue. She states what is concerning her the most is the persistent SOB even at rest, also has severe body aches. She has been drinking plenty of fluids , she has noticed a decrease in appetite    Patient Active Problem List   Diagnosis Date Noted  . Moderate episode of recurrent major depressive disorder ([place]) 02/08/2020  . CKD (chronic kidney disease) stage 3, GFR 30-59 ml/min (HCC) 06/11/2019  . PTSD (post-traumatic stress disorder) 01/14/2019  . Borderline personality disorder ([place]) 01/14/2019  . Anxiety 01/14/2019  . Mixed hyperlipidemia 01/14/2019  . Morbid obesity with BMI of 50.0-59.9, adult ([place]) 01/14/2019  . OSA on CPAP 01/14/2019  . Asthma 04/30/2018  . Low back pain 08/20/2017  . 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 Mastitis, left, acute 12/20/2016  . Essential hypertension     Past Surgical History:  Procedure Laterality Date  . ABDOMINAL HYSTERECTOMY  2008  . BREAST BIOPSY Left 2013   CORE - FIBROADENOMA VS PHYLLODES  . CESAREAN SECTION  2007  . COLONOSCOPY WITH PROPOFOL N/A 10/09/2016   Procedure: COLONOSCOPY WITH PROPOFOL;  Surgeon: Jonathon Bellows, MD;  Location: [facility] ENDOSCOPY;  Service: Gastroenterology;  Laterality: N/A;  . COLONOSCOPY WITH PROPOFOL N/A 11/09/2019   Procedure: COLONOSCOPY WITH  PROPOFOL;  Surgeon: Lin Landsman, MD;  Location: [facility] ENDOSCOPY;  Service: Gastroenterology;  Laterality: N/A;  . GUM SURGERY  2004  . PLANTAR FASCIA RELEASE Left 08/19/2014   Procedure: ENDOSCOPIC PLANTAR FASCIOTOMY RELEASE L WITH TOPAZ;  Surgeon: Albertine Patricia, DPM;  Location: [facility] [facility];  Service: Podiatry;  Laterality: Left;  LMA WITH POPLITEAL BLOCK  . TONSILLECTOMY  1984    Family History  Problem Relation Age of Onset  . Testicular cancer Brother   . Colon cancer Paternal Grandfather   . Breast cancer Maternal Grandmother   . Hypertension Maternal Grandmother   . Aneurysm Maternal Grandmother   . Stroke Maternal Grandmother   . Obesity Mother   . Diabetes Mother   . High Cholesterol Mother   . Hypertension Mother   . Neuropathy Mother   . Depression Mother   . Anxiety disorder Mother   . Obesity Father   . Hypertension Father   . High Cholesterol Father   . Depression Father   . Anxiety disorder Father   . Hypertension Maternal Grandfather   . High Cholesterol Maternal Grandfather   . Heart disease Maternal Grandfather        has a pacemaker  . Stroke Maternal Grandfather   . Alzheimer's disease Paternal Grandmother   . Dementia Paternal Grandmother     Social History   Tobacco Use  . Smoking status: Former Smoker    Packs/day: 0.25    Years: 10.00    Pack years:  2.50    Types: Cigarettes    Quit date: 07/05/2014    Years since quitting: 5.9  . Smokeless tobacco: Never Used  Substance Use Topics  . Alcohol use: Yes    Comment: rare     Current Outpatient Medications:  .  acetaminophen (TYLENOL) 325 MG tablet, Take 650 mg by mouth every 6 (six) hours as needed. , Disp: , Rfl:  .  albuterol (VENTOLIN HFA) 108 (90 Base) MCG/ACT inhaler, Inhale 2 puffs into the lungs every 6 (six) hours as needed for wheezing or shortness of breath., Disp: 18 g, Rfl: 0 .  atorvastatin (LIPITOR) 20 MG tablet, Take 1 tablet (20 mg total) by mouth daily., Disp:  90 tablet, Rfl: 1 .  buPROPion (WELLBUTRIN XL) 300 MG 24 hr tablet, Take 300 mg by mouth every morning., Disp: , Rfl:  .  cholecalciferol (VITAMIN D3) 25 MCG (1000 UNIT) tablet, Take 1,000 Units by mouth daily., Disp: , Rfl:  .  escitalopram (LEXAPRO) 20 MG tablet, Take 1 tablet (20 mg total) by mouth daily., Disp: 30 tablet, Rfl: 0 .  Fluticasone-Salmeterol (ADVAIR DISKUS) 100-50 MCG/DOSE AEPB, Inhale 1 puff into the lungs in the morning and at bedtime., Disp: 60 each, Rfl: 3 .  hydrochlorothiazide (HYDRODIURIL) 25 MG tablet, Take 1 tablet (25 mg total) by mouth daily., Disp: 90 tablet, Rfl: 1 .  hydrOXYzine (ATARAX/VISTARIL) 50 MG tablet, Take 1 tablet (50 mg total) by mouth 2 (two) times daily., Disp: 60 tablet, Rfl: 5 .  modafinil (PROVIGIL) 200 MG tablet, Take 1 tablet (200 mg total) by mouth daily., Disp: 30 tablet, Rfl: 3 .  predniSONE (DELTASONE) 20 MG tablet, Take 2 tablets (40 mg total) by mouth daily with breakfast., Disp: 10 tablet, Rfl: 0 .  tiZANidine (ZANAFLEX) 4 MG tablet, Take 1 tablet (4 mg total) by mouth every 6 (six) hours as needed for muscle spasms., Disp: 30 tablet, Rfl: 0 .  benzonatate (TESSALON) 100 MG capsule, Take 1 capsule (100 mg total) by mouth 2 (two) times daily as needed for cough. (Patient not taking: Reported on 06/08/2020), Disp: 40 capsule, Rfl: 0 .  linaclotide (LINZESS) 290 MCG CAPS capsule, Take 1 capsule (290 mcg total) by mouth daily before breakfast., Disp: 90 capsule, Rfl: 1  Allergies  Allergen Reactions  . Latex Rash    I personally reviewed active problem list, medication list, allergies, family history, social history, health maintenance with the patient/caregiver today.   ROS  Ten systems reviewed and is negative except as mentioned in HPI  Objective  Vitals:   06/08/20 0740  BP: 124/82  Pulse: 93  Resp: 16  Temp: 98.5 F (36.9 C)  TempSrc: Oral  SpO2: 98%  Weight: (!) 318 lb 6.4 oz (144.4 kg)  Height: 5' 4.5" (1.638 m)    Body  mass index is 53.81 kg/m.  Physical Exam  Constitutional: Patient appears well-developed and well-nourished. Obese No distress.  HEENT: head atraumatic, normocephalic, pupils equal and reactive to light, ears,neck supple Cardiovascular: Normal rate, regular rhythm and normal heart sounds.  No murmur heard. No BLE edema. Pulmonary/Chest: Effort normal and breath sounds normal. No respiratory distress. Abdominal: Soft.  There is no tenderness. Psychiatric: Patient has a normal mood and affect. behavior is normal. Judgment and thought content normal.  Recent Results (from the past 2160 hour(s))  Pregnancy, urine     Status: None   Collection Time: 04/04/20  8:52 AM  Result Value Ref Range   Preg Test, Ur NEGATIVE NEGATIVE  Comment: Performed at [facility], [address] [place], [place] [address]  CBC with Differential     Status: None   Collection Time: 04/04/20  8:56 AM  Result Value Ref Range   WBC 7.0 4.0 - 10.5 K/uL   RBC 4.78 3.87 - 5.11 MIL/uL   Hemoglobin 14.4 12.0 - 15.0 g/dL   HCT 43.0 36.0 - 46.0 %   MCV 90.0 80.0 - 100.0 fL   MCH 30.1 26.0 - 34.0 pg   MCHC 33.5 30.0 - 36.0 g/dL   RDW 13.2 11.5 - 15.5 %   Platelets 202 150 - 400 K/uL   nRBC 0.0 0.0 - 0.2 %   Neutrophils Relative % 51 %   Neutro Abs 3.5 1.7 - 7.7 K/uL   Lymphocytes Relative 38 %   Lymphs Abs 2.7 0.7 - 4.0 K/uL   Monocytes Relative 7 %   Monocytes Absolute 0.5 0.1 - 1.0 K/uL   Eosinophils Relative 3 %   Eosinophils Absolute 0.2 0.0 - 0.5 K/uL   Basophils Relative 1 %   Basophils Absolute 0.1 0.0 - 0.1 K/uL   Immature Granulocytes 0 %   Abs Immature Granulocytes 0.03 0.00 - 0.07 K/uL    Comment: Performed at [facility], [address] [place], [place] [address]  Comprehensive metabolic panel     Status: Abnormal   Collection Time: 04/04/20  8:56 AM  Result Value Ref Range   Sodium 140 135 - 145 mmol/L   Potassium 3.6 3.5 - 5.1 mmol/L   Chloride 108 98 - 111 mmol/L    CO2 24 22 - 32 mmol/L   Glucose, Bld 106 (H) 70 - 99 mg/dL    Comment: Glucose reference range applies only to samples taken after fasting for at least 8 hours.   BUN 18 6 - 20 mg/dL   Creatinine, Ser 1.13 (H) 0.44 - 1.00 mg/dL   Calcium 9.3 8.9 - 10.3 mg/dL   Total Protein 7.0 6.5 - 8.1 g/dL   Albumin 4.1 3.5 - 5.0 g/dL   AST 11 (L) 15 - 41 U/L   ALT 18 0 - 44 U/L   Alkaline Phosphatase 45 38 - 126 U/L   Total Bilirubin 0.6 0.3 - 1.2 mg/dL   GFR, Estimated >60 >60 mL/min    Comment: (NOTE) Calculated using the CKD-EPI Creatinine Equation (2021)    Anion gap 8 5 - 15    Comment: Performed at [facility], [address] [place], [place] [address]  Lipase, blood     Status: None   Collection Time: 04/04/20  8:56 AM  Result Value Ref Range   Lipase 38 11 - 51 U/L    Comment: Performed at [facility], [address] [place], [place] [address]  Urinalysis, Complete w Microscopic Urine, Clean Catch     Status: Abnormal   Collection Time: 04/04/20  8:56 AM  Result Value Ref Range   Color, Urine STRAW (A) YELLOW   APPearance CLEAR (A) CLEAR   Specific Gravity, Urine 1.006 1.005 - 1.030   pH 6.0 5.0 - 8.0   Glucose, UA NEGATIVE NEGATIVE mg/dL   Hgb urine dipstick NEGATIVE NEGATIVE   Bilirubin Urine NEGATIVE NEGATIVE   Ketones, ur NEGATIVE NEGATIVE mg/dL   Protein, ur NEGATIVE NEGATIVE mg/dL   Nitrite NEGATIVE NEGATIVE   Leukocytes,Ua NEGATIVE NEGATIVE   RBC / HPF 0-5 0 - 5 RBC/hpf   WBC, UA 0-5 0 - 5 WBC/hpf   Bacteria, UA  RARE (A) NONE SEEN   Squamous Epithelial / LPF 0-5 0 - 5   Mucus PRESENT     Comment: Performed at [facility], [address] [place], [place] [address]  POC urine preg, ED (not at [facility])     Status: None   Collection Time: 04/04/20 10:24 AM  Result Value Ref Range   Preg Test, Ur NEGATIVE NEGATIVE    Comment:        THE SENSITIVITY OF THIS METHODOLOGY IS >24 mIU/mL   Urinalysis, Complete     Status: None   Collection  Time: 04/14/20  9:32 AM  Result Value Ref Range   Specific Gravity, UA 1.020 1.005 - 1.030   pH, UA 7.0 5.0 - 7.5   Color, UA Yellow Yellow   Appearance Ur Clear Clear   Leukocytes,UA Negative Negative   Protein,UA Negative Negative/Trace   Glucose, UA Negative Negative   Ketones, UA Negative Negative   RBC, UA Negative Negative   Bilirubin, UA Negative Negative   Urobilinogen, Ur 0.2 0.2 - 1.0 mg/dL   Nitrite, UA Negative Negative   Microscopic Examination See below:   Microscopic Examination     Status: None   Collection Time: 04/14/20  9:32 AM   Urine  Result Value Ref Range   WBC, UA None seen 0 - 5 /hpf   RBC None seen 0 - 2 /hpf   Epithelial Cells (non renal) 0-10 0 - 10 /hpf   Bacteria, UA None seen None seen/Few  Novel Coronavirus, NAA ([facility])     Status: None   Collection Time: 05/31/20 12:00 AM   Specimen: Nasopharyngeal(NP) swabs in vial transport medium   Nasopharynge  Result Value Ref Range   SARS-CoV-2, NAA Not Detected Not Detected    Comment: This nucleic acid amplification test was developed and its performance characteristics determined by [employer]. Nucleic acid amplification tests include RT-PCR and TMA. This test has not been FDA cleared or approved. This test has been authorized by FDA under an Emergency Use Authorization (EUA). This test is only authorized for the duration of time the declaration that circumstances exist justifying the authorization of the emergency use of in vitro diagnostic tests for detection of SARS-CoV-2 virus and/or diagnosis of COVID-19 infection under section 564(b)(1) of the Act, 21 U.S.C. 270WCB-7(S) (1), unless the authorization is terminated or revoked sooner. When diagnostic testing is negative, the possibility of a false negative result should be considered in the context of a patient's recent exposures and the presence of clinical signs and symptoms consistent with COVID-19. An individual without symptoms  of COVID-19 and who is not shedding SARS-CoV-2 virus wo uld expect to have a negative (not detected) result in this assay.   SARS-COV-2, NAA 2 DAY TAT     Status: None   Collection Time: 05/31/20 12:00 AM   Nasopharynge  Result Value Ref Range   SARS-CoV-2, NAA 2 DAY TAT Performed       PHQ2/9: Depression screen [facility] 2/9 06/08/2020 05/31/2020 02/08/2020 11/24/2019 09/29/2019  Decreased Interest 0 0 1 2 2   Down, Depressed, Hopeless 0 0 0 - 1  PHQ - 2 Score 0 0 1 2 3   Altered sleeping 3 1 1 3  -  Tired, decreased energy 3 1 1 2  -  Change in appetite 1 0 3 2 -  Feeling bad or failure about yourself  0 0 0 0 -  Trouble concentrating 2 0 2 2 -  Moving slowly or fidgety/restless 1 0  0 1 -  Suicidal thoughts 0 0 0 0 -  PHQ-9 Score 10 2 8 12  -  Difficult doing work/chores Somewhat difficult Not difficult at all - Somewhat difficult -  Some recent data might be hidden    phq 9 is symptoms positive because she is feeling sick    Fall Risk: Fall Risk  06/08/2020 05/31/2020 02/08/2020 09/29/2019 08/06/2019  Falls in the past year? 1 0 1 1 0  Number falls in past yr: 1 0 1 1 0  Injury with Fall? 0 0 1 0 0  Risk for fall due to : No Fall Risks - - - -  Follow up Falls evaluation completed Falls prevention discussed - Falls evaluation completed -     Functional Status Survey: Is the patient deaf or have difficulty hearing?: No Does the patient have difficulty seeing, even when wearing glasses/contacts?: Yes Does the patient have difficulty concentrating, remembering, or making decisions?: Yes Does the patient have difficulty walking or climbing stairs?: Yes Does the patient have difficulty dressing or bathing?: No Does the patient have difficulty doing errands alone such as visiting a doctor's office or shopping?: No    Assessment & Plan  1. SOB (shortness of breath)  - DG Chest 2 View; Future - CBC with Differential/Platelet - COMPLETE METABOLIC PANEL WITH GFR - C-reactive protein -  Sedimentation rate  Tussionex rx sent to pharmacy today   2. Body aches  - DG Chest 2 View; Future - CBC with Differential/Platelet - COMPLETE METABOLIC PANEL WITH GFR - C-reactive protein - Sedimentation rate  3. Mild intermittent asthma with acute exacerbation

## 2020-06-08 ENCOUNTER — Encounter: Payer: Self-pay | Admitting: Family Medicine

## 2020-06-08 ENCOUNTER — Other Ambulatory Visit: Payer: Self-pay

## 2020-06-08 ENCOUNTER — Other Ambulatory Visit: Payer: Self-pay | Admitting: Family Medicine

## 2020-06-08 ENCOUNTER — Telehealth: Payer: Self-pay

## 2020-06-08 ENCOUNTER — Ambulatory Visit
Admission: RE | Admit: 2020-06-08 | Discharge: 2020-06-08 | Disposition: A | Discharge status: Home / Self Care | Payer: 59 | Attending: Family Medicine | Admitting: Family Medicine

## 2020-06-08 ENCOUNTER — Ambulatory Visit (INDEPENDENT_AMBULATORY_CARE_PROVIDER_SITE_OTHER): Payer: 59 | Admitting: Family Medicine

## 2020-06-08 ENCOUNTER — Ambulatory Visit
Admission: RE | Admit: 2020-06-08 | Discharge: 2020-06-08 | Disposition: A | Discharge status: Home / Self Care | Payer: 59 | Source: Ambulatory Visit | Attending: Family Medicine | Admitting: Family Medicine

## 2020-06-08 VITALS — BP 124/82 | HR 93 | Temp 98.5°F | Resp 16 | Ht 64.5 in | Wt 318.4 lb

## 2020-06-08 DIAGNOSIS — J4521 Mild intermittent asthma with (acute) exacerbation: Secondary | ICD-10-CM

## 2020-06-08 DIAGNOSIS — R0602 Shortness of breath: Secondary | ICD-10-CM

## 2020-06-08 DIAGNOSIS — R52 Pain, unspecified: Secondary | ICD-10-CM | POA: Insufficient documentation

## 2020-06-08 MED ORDER — PROMETHAZINE-CODEINE 6.25-10 MG/5ML PO SYRP: 5.0000 mL | ORAL_SOLUTION | Freq: Four times a day (QID) | ORAL | 0 refills | Status: DC | PRN

## 2020-06-08 MED ORDER — HYDROCOD POLST-CPM POLST ER 10-8 MG/5ML PO SUER: 5.0000 mL | Freq: Two times a day (BID) | ORAL | 0 refills | Status: DC | PRN

## 2020-06-08 NOTE — Telephone Encounter (Signed)
Spoke to pharmacist to ensure Tussonex was d/c'd and new medication was confirmed.

## 2020-06-08 NOTE — Telephone Encounter (Signed)
Copied from [place] [phone]. Topic: Quick Communication - Rx Refill/Question >> Jun 08, 2020  9:30 AM Robina Ade, Helene Kelp D wrote: Medication: Tussionex  Has the patient contacted their pharmacy? Yes.  But pharmacy does not have medication. Pt said that something was send to office also. (Agent: If no, request that the patient contact the pharmacy for the refill.) (Agent: If yes, when and what did the pharmacy advise?)  Preferred Pharmacy (with phone number or street name): [place], [place]  Agent: Please be advised that RX refills may take up to 3 business days. We ask that you follow-up with your pharmacy.

## 2020-06-09 ENCOUNTER — Other Ambulatory Visit: Payer: Self-pay | Admitting: Family Medicine

## 2020-06-09 LAB — COMPLETE METABOLIC PANEL WITH GFR
AG Ratio: 2.1 (calc) (ref 1.0–2.5)
ALT: 17 U/L (ref 6–29)
AST: 8 U/L — ABNORMAL LOW (ref 10–30)
Albumin: 4.5 g/dL (ref 3.6–5.1)
Alkaline phosphatase (APISO): 58 U/L (ref 31–125)
BUN/Creatinine Ratio: 17 (calc) (ref 6–22)
BUN: 21 mg/dL (ref 7–25)
CO2: 27 mmol/L (ref 20–32)
Calcium: 9.4 mg/dL (ref 8.6–10.2)
Chloride: 102 mmol/L (ref 98–110)
Creat: 1.27 mg/dL — ABNORMAL HIGH (ref 0.50–1.10)
GFR, Est African American: 61 mL/min/{1.73_m2} (ref >=60)
GFR, Est Non African American: 52 mL/min/{1.73_m2} — ABNORMAL LOW (ref >=60)
Globulin: 2.1 g/dL (calc) (ref 1.9–3.7)
Glucose, Bld: 106 mg/dL — ABNORMAL HIGH (ref 65–99)
Potassium: 3.3 mmol/L — ABNORMAL LOW (ref 3.5–5.3)
Sodium: 140 mmol/L (ref 135–146)
Total Bilirubin: 0.3 mg/dL (ref 0.2–1.2)
Total Protein: 6.6 g/dL (ref 6.1–8.1)

## 2020-06-09 LAB — CBC WITH DIFFERENTIAL/PLATELET
Absolute Monocytes: 720 cells/uL (ref 200–950)
Basophils Absolute: 84 cells/uL (ref 0–200)
Basophils Relative: 0.7 %
Eosinophils Absolute: 36 cells/uL (ref 15–500)
Eosinophils Relative: 0.3 %
HCT: 42.4 % (ref 35.0–45.0)
Hemoglobin: 14 g/dL (ref 11.7–15.5)
Lymphs Abs: 3240 cells/uL (ref 850–3900)
MCH: 30.2 pg (ref 27.0–33.0)
MCHC: 33 g/dL (ref 32.0–36.0)
MCV: 91.4 fL (ref 80.0–100.0)
MPV: 9.1 fL (ref 7.5–12.5)
Monocytes Relative: 6 %
Neutro Abs: 7920 cells/uL — ABNORMAL HIGH (ref 1500–7800)
Neutrophils Relative %: 66 %
Platelets: 234 10*3/uL (ref 140–400)
RBC: 4.64 10*6/uL (ref 3.80–5.10)
RDW: 13.3 % (ref 11.0–15.0)
Total Lymphocyte: 27 %
WBC: 12 10*3/uL — ABNORMAL HIGH (ref 3.8–10.8)

## 2020-06-09 LAB — SEDIMENTATION RATE: Sed Rate: 2 mm/h (ref 0–20)

## 2020-06-09 LAB — C-REACTIVE PROTEIN: CRP: 4.7 mg/L (ref <8.0)

## 2020-06-09 MED ORDER — POTASSIUM CHLORIDE CRYS ER 20 MEQ PO TBCR: 20.0000 meq | EXTENDED_RELEASE_TABLET | Freq: Every day | ORAL | 0 refills | Status: DC

## 2020-06-09 NOTE — Telephone Encounter (Signed)
Pt was calling to follow up on her prescription for promethazine-codeine (PHENERGAN WITH CODEINE) 6.25-10 MG/5ML syrup, pt wanted to know if it could be sent to a different pharmacy that carries it or if a generic could be sent in, please advise

## 2020-06-10 ENCOUNTER — Other Ambulatory Visit: Payer: Self-pay | Admitting: Family Medicine

## 2020-06-10 ENCOUNTER — Encounter: Payer: Self-pay | Admitting: Family Medicine

## 2020-06-10 DIAGNOSIS — R059 Cough, unspecified: Secondary | ICD-10-CM

## 2020-06-10 MED ORDER — GUAIFENESIN-CODEINE 100-10 MG/5ML PO SOLN: 10.0000 mL | Freq: Three times a day (TID) | ORAL | 0 refills | Status: DC | PRN

## 2020-06-10 NOTE — Telephone Encounter (Signed)
error:[id number] ° °

## 2020-06-10 NOTE — Telephone Encounter (Signed)
promethazine-codeine (PHENERGAN WITH CODEINE) 6.25-10 MG/5ML syrup  Patient called in and says [facility] does'nt carry this new medication with codeine at all prescribed above. Patient would like to use [facility], [address], [place], [place] [address]. Please advise

## 2020-06-10 NOTE — Telephone Encounter (Signed)
Patient spoke with pharmacist and was informed that guaifenesin/codiene might be a little cheaper for her.  Patient would like the other rx canceled and to send in guaifenesin/codiene sent to [facility].  I did inform patient that she would probably still need to use the Good Rx discount card as well.  Pt verbalized understanding.

## 2020-06-10 NOTE — Telephone Encounter (Signed)
Left detailed vm °

## 2020-06-17 ENCOUNTER — Other Ambulatory Visit: Payer: Self-pay | Admitting: Family Medicine

## 2020-06-17 ENCOUNTER — Ambulatory Visit: Payer: Self-pay

## 2020-06-17 NOTE — Telephone Encounter (Signed)
Spoke with patient made first avail appt. Coming in office and will speak with [place] on Monday to see if she needs it to be virtual. Told pt if she did not hear from [name] then come on in office on Tuesday

## 2020-06-17 NOTE — Telephone Encounter (Signed)
Answer Assessment - Initial Assessment Questions 1. ONSET: "When did the cough begin?"      3 weeks 2. SEVERITY: "How bad is the cough today?"      Severe 3. SPUTUM: "Describe the color of your sputum" (none, dry cough; clear, white, yellow, green)     None 4. HEMOPTYSIS: "Are you coughing up any blood?" If so ask: "How much?" (flecks, streaks, tablespoons, etc.)     No 5. DIFFICULTY BREATHING: "Are you having difficulty breathing?" If Yes, ask: "How bad is it?" (e.g., mild, moderate, severe)    - MILD: No SOB at rest, mild SOB with walking, speaks normally in sentences, can lie down, no retractions, pulse < 100.    - MODERATE: SOB at rest, SOB with minimal exertion and prefers to sit, cannot lie down flat, speaks in phrases, mild retractions, audible wheezing, pulse 100-120.    - SEVERE: Very SOB at rest, speaks in single words, struggling to breathe, sitting hunched forward, retractions, pulse > 120      Still having SOB 6. FEVER: "Do you have a fever?" If Yes, ask: "What is your temperature, how was it measured, and when did it start?"     No 7. CARDIAC HISTORY: "Do you have any history of heart disease?" (e.g., heart attack, congestive heart failure)      No 8. LUNG HISTORY: "Do you have any history of lung disease?"  (e.g., pulmonary embolus, asthma, emphysema)     Asthma 9. PE RISK FACTORS: "Do you have a history of blood clots?" (or: recent major surgery, recent prolonged travel, bedridden)     No 10. OTHER SYMPTOMS: "Do you have any other symptoms?" (e.g., runny nose, wheezing, chest pain)       No 11. PREGNANCY: "Is there any chance you are pregnant?" "When was your last menstrual period?"       No 12. TRAVEL: "Have you traveled out of the country in the last month?" (e.g., travel history, exposures)       No  Protocols used: Cough - Acute Non-Productive-A-AH

## 2020-06-17 NOTE — Telephone Encounter (Signed)
Pt. States she had bronchitis x 3 weeks. "I got better but this week started cough really bad mostly at night. I still have a sore throat." Her job did another COVID 19 test today - negative. Non-productive cough. Keeping her up at night "even with the prescription cough medicine." Using inhalers as prescribed. No wheezing.Requests medication be sent to her pharmacy. Please advise.

## 2020-06-18 MED ORDER — TIZANIDINE HCL 4 MG PO TABS: 4.0000 mg | ORAL_TABLET | Freq: Four times a day (QID) | ORAL | 0 refills | Status: DC | PRN

## 2020-06-20 ENCOUNTER — Ambulatory Visit: Payer: Self-pay | Admitting: *Deleted

## 2020-06-20 ENCOUNTER — Emergency Department: Payer: 59

## 2020-06-20 ENCOUNTER — Encounter: Payer: Self-pay | Admitting: Emergency Medicine

## 2020-06-20 ENCOUNTER — Emergency Department
Admission: EM | Admit: 2020-06-20 | Discharge: 2020-06-21 | Disposition: A | Discharge status: Home / Self Care | Payer: 59 | Attending: Emergency Medicine | Admitting: Emergency Medicine

## 2020-06-20 ENCOUNTER — Other Ambulatory Visit: Payer: Self-pay

## 2020-06-20 ENCOUNTER — Ambulatory Visit: Payer: 59 | Admitting: Dermatology

## 2020-06-20 DIAGNOSIS — U071 COVID-19: Secondary | ICD-10-CM | POA: Diagnosis not present

## 2020-06-20 DIAGNOSIS — Z91040 Latex allergy status: Secondary | ICD-10-CM | POA: Insufficient documentation

## 2020-06-20 DIAGNOSIS — E876 Hypokalemia: Secondary | ICD-10-CM

## 2020-06-20 DIAGNOSIS — N183 Chronic kidney disease, stage 3 unspecified: Secondary | ICD-10-CM | POA: Insufficient documentation

## 2020-06-20 DIAGNOSIS — Z79899 Other long term (current) drug therapy: Secondary | ICD-10-CM | POA: Insufficient documentation

## 2020-06-20 DIAGNOSIS — J45909 Unspecified asthma, uncomplicated: Secondary | ICD-10-CM | POA: Insufficient documentation

## 2020-06-20 DIAGNOSIS — Z87891 Personal history of nicotine dependence: Secondary | ICD-10-CM | POA: Insufficient documentation

## 2020-06-20 DIAGNOSIS — R079 Chest pain, unspecified: Secondary | ICD-10-CM

## 2020-06-20 DIAGNOSIS — I129 Hypertensive chronic kidney disease with stage 1 through stage 4 chronic kidney disease, or unspecified chronic kidney disease: Secondary | ICD-10-CM | POA: Diagnosis not present

## 2020-06-20 DIAGNOSIS — R059 Cough, unspecified: Secondary | ICD-10-CM | POA: Diagnosis present

## 2020-06-20 LAB — BASIC METABOLIC PANEL
Anion gap: 7 (ref 5–15)
BUN: 11 mg/dL (ref 6–20)
CO2: 31 mmol/L (ref 22–32)
Calcium: 9 mg/dL (ref 8.9–10.3)
Chloride: 101 mmol/L (ref 98–111)
Creatinine, Ser: 1.3 mg/dL — ABNORMAL HIGH (ref 0.44–1.00)
GFR, Estimated: 53 mL/min — ABNORMAL LOW (ref >60)
Glucose, Bld: 102 mg/dL — ABNORMAL HIGH (ref 70–99)
Potassium: 2.8 mmol/L — ABNORMAL LOW (ref 3.5–5.1)
Sodium: 139 mmol/L (ref 135–145)

## 2020-06-20 LAB — CBC WITH DIFFERENTIAL/PLATELET
Abs Immature Granulocytes: 0.02 10*3/uL (ref 0.00–0.07)
Basophils Absolute: 0.1 10*3/uL (ref 0.0–0.1)
Basophils Relative: 1 %
Eosinophils Absolute: 0.3 10*3/uL (ref 0.0–0.5)
Eosinophils Relative: 4 %
HCT: 39.3 % (ref 36.0–46.0)
Hemoglobin: 13.1 g/dL (ref 12.0–15.0)
Immature Granulocytes: 0 %
Lymphocytes Relative: 36 %
Lymphs Abs: 2.9 10*3/uL (ref 0.7–4.0)
MCH: 30.3 pg (ref 26.0–34.0)
MCHC: 33.3 g/dL (ref 30.0–36.0)
MCV: 91 fL (ref 80.0–100.0)
Monocytes Absolute: 0.8 10*3/uL (ref 0.1–1.0)
Monocytes Relative: 10 %
Neutro Abs: 4 10*3/uL (ref 1.7–7.7)
Neutrophils Relative %: 49 %
Platelets: 184 10*3/uL (ref 150–400)
RBC: 4.32 MIL/uL (ref 3.87–5.11)
RDW: 13.6 % (ref 11.5–15.5)
WBC: 8.1 10*3/uL (ref 4.0–10.5)
nRBC: 0 % (ref 0.0–0.2)

## 2020-06-20 LAB — RESP PANEL BY RT-PCR (FLU A&B, COVID) ARPGX2
Influenza A by PCR: NEGATIVE
Influenza B by PCR: NEGATIVE
SARS Coronavirus 2 by RT PCR: POSITIVE — AB

## 2020-06-20 LAB — TROPONIN I (HIGH SENSITIVITY): Troponin I (High Sensitivity): 4 ng/L (ref <18)

## 2020-06-20 MED ORDER — GUAIFENESIN-CODEINE 100-10 MG/5ML PO SOLN: 5.0000 mL | Freq: Three times a day (TID) | ORAL | 0 refills | Status: AC | PRN

## 2020-06-20 MED ORDER — BENZONATATE 100 MG PO CAPS: 100.0000 mg | ORAL_CAPSULE | Freq: Three times a day (TID) | ORAL | 0 refills | Status: DC | PRN

## 2020-06-20 MED ORDER — POTASSIUM CHLORIDE CRYS ER 20 MEQ PO TBCR
40.0000 meq | EXTENDED_RELEASE_TABLET | Freq: Once | ORAL | Status: AC
  Administered 2020-06-20: 40 meq via ORAL
  Filled 2020-06-20: qty 2

## 2020-06-20 MED ORDER — POTASSIUM CHLORIDE CRYS ER 20 MEQ PO TBCR: 20.0000 meq | EXTENDED_RELEASE_TABLET | Freq: Two times a day (BID) | ORAL | 0 refills | Status: DC

## 2020-06-20 NOTE — Discharge Instructions (Addendum)
Please take Klor Con potassium as prescribed for the next 7 days. You may also take cough medication as prescribed. Return to the ER if you develop any worsening of symptoms, otherwise follow up with PCP in 1 week.

## 2020-06-20 NOTE — ED Provider Notes (Signed)
  Physical Exam  BP (!) 158/86 (BP Location: Left Arm)   Pulse 86   Temp 98.3 F (36.8 C) (Oral)   Resp 20   Ht 5\' 4"  (1.626 m)   Wt (!) 144.2 kg   SpO2 98%   BMI 54.58 kg/m     ED Course/Procedures      MDM  Patient is received in signout from attending provider Dr. Charna Archer for evaluation of 3 weeks of illness, with positive home COVID test today.  Current plan is to await remaining labs with BMP, troponin for chest discomfort that the patient is reporting.  Anticipate discharge if these are within normal limits.  The laboratory evaluations were reviewed and the patient's potassium was noted to be decreased to 2.8.  She also has noted stable CKD.  Remainder of labs grossly unremarkable except for positive COVID.  I discussed her hypokalemia with current attending Dr. Karma Greaser, who recommends giving 76 M EQ's of potassium at our facility followed by 43 M EQ's twice daily for the next 7 days.  This was provided as a prescription for the patient.  She was also provided with a prescription for cough medication.  She was instructed to have close follow-up with her PCP within 1 week for repeat laboratory evaluation.  Return precautions were discussed at length and patient will return for any worsening.  Patient is stable at this time for outpatient management.       Marlana Salvage, PA 06/21/20 Ileene Musa    Blake Divine, MD 06/21/20 1537

## 2020-06-20 NOTE — ED Triage Notes (Signed)
First Nurse Note: Tested positive for COVID this morning, c/o difficulty breathing.  AAOx3.  Skin warm and dry.  No SOB/DOE.  NAD

## 2020-06-20 NOTE — Telephone Encounter (Signed)
Patient is calling to inform PCP that she has tested + COVID. Patient has been having symptoms for 3 weeks- but has tested negative until now. Patient was treated with prednisone and had chest x-ray 6/1. Patient states her breathing symptoms are back- pressure in chest and feels like not filling lungs, non productive cough, fatigue. Patient states her inhalers do not seem to be helping. Patient has virtual visit scheduled for tomorrow- advised UC today- but patient states she can not afford. Call to office-they will review with provider for patient advise.  Reason for Disposition  MILD difficulty breathing (e.g., minimal/no SOB at rest, SOB with walking, pulse <100)  Answer Assessment - Initial Assessment Questions 1. COVID-19 DIAGNOSIS: "Who made your COVID-19 diagnosis?" "Was it confirmed by a positive lab test or self-test?" If not diagnosed by a doctor (or NP/PA), ask "Are there lots of cases (community spread) where you live?" Note: See public health department website, if unsure.     Employer testing 2. COVID-19 EXPOSURE: "Was there any known exposure to COVID before the symptoms began?" CDC Definition of close contact: within 6 feet (2 meters) for a total of 15 minutes or more over a 24-hour period.      No known exposure 3. ONSET: "When did the COVID-19 symptoms start?"      3 weeks ago 4. WORST SYMPTOM: "What is your worst symptom?" (e.g., cough, fever, shortness of breath, muscle aches)     Difficulty breathing 5. COUGH: "Do you have a cough?" If Yes, ask: "How bad is the cough?"       Yes- coughing spells 6. FEVER: "Do you have a fever?" If Yes, ask: "What is your temperature, how was it measured, and when did it start?"     no 7. RESPIRATORY STATUS: "Describe your breathing?" (e.g., shortness of breath, wheezing, unable to speak)      No wheezing- feels like someone is sitting on chest- hard to fill up lungs 8. BETTER-SAME-WORSE: "Are you getting better, staying the same or getting  worse compared to yesterday?"  If getting worse, ask, "In what way?"     Worse- increased symptoms 9. HIGH RISK DISEASE: "Do you have any chronic medical problems?" (e.g., asthma, heart or lung disease, weak immune system, obesity, etc.)     Asthma, high blood pressure, sleep apnea 10. VACCINE: "Have you had the COVID-19 vaccine?" If Yes, ask: "Which one, how many shots, when did you get it?"       unsure 11. BOOSTER: "Have you received your COVID-19 booster?" If Yes, ask: "Which one and when did you get it?"       About May- not sure 12. PREGNANCY: "Is there any chance you are pregnant?" "When was your last menstrual period?"       N/a 13. OTHER SYMPTOMS: "Do you have any other symptoms?"  (e.g., chills, fatigue, headache, loss of smell or taste, muscle pain, sore throat)       Fatigue, muscle aches, headache 14. O2 SATURATION MONITOR:  "Do you use an oxygen saturation monitor (pulse oximeter) at home?" If Yes, ask "What is your reading (oxygen level) today?" "What is your usual oxygen saturation reading?" (e.g., 95%)       no  Protocols used: Coronavirus (COVID-19) Diagnosed or Suspected-A-AH

## 2020-06-20 NOTE — ED Provider Notes (Signed)
[facility] Emergency Department Provider Note   ____________________________________________   Event Date/Time   First MD Initiated Contact with Patient 06/20/20 1931     (approximate)  I have reviewed the triage vital signs and the nursing notes.   HISTORY  Chief Complaint Cough    HPI Oneida L Shultis is a 40 y.o. female with past medical history of hypertension, hyperlipidemia, asthma, and CKD who presents to the ED complaining of cough and shortness of breath.  Patient reports that she has been dealing with productive cough, shortness of breath, and soreness in her chest for about 3 weeks.  She states that she has taken multiple COVID tests that have been negative until she had a positive home test earlier today.  She denies any fevers but states that her chest feels heavy and she has a difficult time catching her breath with any exertion.  She has not noticed any pain or swelling in her legs.  She is fully vaccinated against COVID-19 and has received a booster shot.  She endorses diarrhea but denies abdominal pain or vomiting.        Past Medical History:  Diagnosis Date   Anxiety    Arthritis 2006   rhuematoid - mild - no current issues   Asthma 4496   Complication of anesthesia    ran fever after c-section - had infection.  "Usually" has low temp after surgery.   Hypertension 2013   Lump or mass in breast 2013   RIGHT BREAST   Motion sickness    repeated amusement park rides   Scoliosis    no current issues   Shortness of breath dyspnea    secondary to weight    Ulcer 2001    Patient Active Problem List   Diagnosis Date Noted   Moderate episode of recurrent major depressive disorder ([place]) 02/08/2020   CKD (chronic kidney disease) stage 3, GFR 30-59 ml/min (HCC) 06/11/2019   PTSD (post-traumatic stress disorder) 01/14/2019   Borderline personality disorder ([place]) 01/14/2019   Anxiety 01/14/2019   Mixed hyperlipidemia 01/14/2019    Morbid obesity with BMI of 50.0-59.9, adult ([place]) 01/14/2019   OSA on CPAP 01/14/2019   Asthma 04/30/2018   Low back pain 08/20/2017   Fibroadenoma of breast, 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   Mastitis, left, acute 12/20/2016   Essential hypertension     Past Surgical History:  Procedure Laterality Date   ABDOMINAL HYSTERECTOMY  2008   BREAST BIOPSY Left 2013   CORE - FIBROADENOMA VS PHYLLODES   CESAREAN SECTION  2007   COLONOSCOPY WITH PROPOFOL N/A 10/09/2016   Procedure: COLONOSCOPY WITH PROPOFOL;  Surgeon: Jonathon Bellows, MD;  Location: [facility] ENDOSCOPY;  Service: Gastroenterology;  Laterality: N/A;   COLONOSCOPY WITH PROPOFOL N/A 11/09/2019   Procedure: COLONOSCOPY WITH PROPOFOL;  Surgeon: Lin Landsman, MD;  Location: [facility] ENDOSCOPY;  Service: Gastroenterology;  Laterality: N/A;   GUM SURGERY  2004   PLANTAR FASCIA RELEASE Left 08/19/2014   Procedure: ENDOSCOPIC PLANTAR FASCIOTOMY RELEASE L WITH TOPAZ;  Surgeon: Albertine Patricia, DPM;  Location: [place];  Service: Podiatry;  Laterality: Left;  LMA WITH POPLITEAL BLOCK   TONSILLECTOMY  1984    Prior to Admission medications   Medication Sig Start Date End Date Taking? Authorizing Provider  acetaminophen (TYLENOL) 325 MG tablet Take 650 mg by mouth every 6 (six) hours as needed.     [provider]  albuterol (VENTOLIN HFA) 108 (90 Base) MCG/ACT inhaler Inhale 2 puffs into the  lungs every 6 (six) hours as needed for wheezing or shortness of breath. 02/08/20   Steele Sizer, MD  atorvastatin (LIPITOR) 20 MG tablet Take 1 tablet (20 mg total) by mouth daily. 02/08/20   Steele Sizer, MD  buPROPion (WELLBUTRIN XL) 300 MG 24 hr tablet Take 300 mg by mouth every morning. 01/19/20   [provider]  cholecalciferol (VITAMIN D3) 25 MCG (1000 UNIT) tablet Take 1,000 Units by mouth daily.    [provider]  escitalopram (LEXAPRO) 20 MG tablet Take 1 tablet (20 mg total) by mouth daily. 08/06/19   Steele Sizer, MD   Fluticasone-Salmeterol (ADVAIR DISKUS) 100-50 MCG/DOSE AEPB Inhale 1 puff into the lungs in the morning and at bedtime. 02/08/20   Steele Sizer, MD  guaiFENesin-codeine 100-10 MG/5ML syrup Take 10 mLs by mouth 3 (three) times daily as needed for cough. 06/10/20   Steele Sizer, MD  hydrochlorothiazide (HYDRODIURIL) 25 MG tablet Take 1 tablet (25 mg total) by mouth daily. 02/08/20   Steele Sizer, MD  hydrOXYzine (ATARAX/VISTARIL) 50 MG tablet Take 1 tablet (50 mg total) by mouth 2 (two) times daily. 08/06/19   Steele Sizer, MD  linaclotide Rolan Lipa) 290 MCG CAPS capsule Take 1 capsule (290 mcg total) by mouth daily before breakfast. 02/22/20 05/22/20  Lin Landsman, MD  modafinil (PROVIGIL) 200 MG tablet Take 1 tablet (200 mg total) by mouth daily. 02/08/20   Steele Sizer, MD  potassium chloride SA (KLOR-CON) 20 MEQ tablet Take 1 tablet (20 mEq total) by mouth daily. 06/09/20   Steele Sizer, MD  predniSONE (DELTASONE) 20 MG tablet Take 2 tablets (40 mg total) by mouth daily with breakfast. 06/03/20   Brunetta Jeans, PA-C  tiZANidine (ZANAFLEX) 4 MG tablet Take 1 tablet (4 mg total) by mouth every 6 (six) hours as needed for muscle spasms. 06/18/20   Steele Sizer, MD    Allergies Latex  Family History  Problem Relation Age of Onset   Testicular cancer Brother    Colon cancer Paternal Grandfather    Breast cancer Maternal Grandmother    Hypertension Maternal Grandmother    Aneurysm Maternal Grandmother    Stroke Maternal Grandmother    Obesity Mother    Diabetes Mother    High Cholesterol Mother    Hypertension Mother    Neuropathy Mother    Depression Mother    Anxiety disorder Mother    Obesity Father    Hypertension Father    High Cholesterol Father    Depression Father    Anxiety disorder Father    Hypertension Maternal Grandfather    High Cholesterol Maternal Grandfather    Heart disease Maternal Grandfather        has a pacemaker   Stroke Maternal  Grandfather    Alzheimer's disease Paternal Grandmother    Dementia Paternal Grandmother     Social History Social History   Tobacco Use   Smoking status: Former    Packs/day: 0.25    Years: 10.00    Pack years: 2.50    Types: Cigarettes    Quit date: 07/05/2014    Years since quitting: 5.9   Smokeless tobacco: Never  Vaping Use   Vaping Use: Never used  Substance Use Topics   Alcohol use: Yes    Comment: rare   Drug use: No    Review of Systems  Constitutional: No fever/chills Eyes: No visual changes. ENT: No sore throat. Cardiovascular: Positive for chest pain. Respiratory: Positive for cough and shortness of breath.  Gastrointestinal: No abdominal pain.  No nausea, no vomiting.  Positive for diarrhea.  No constipation. Genitourinary: Negative for dysuria. Musculoskeletal: Negative for back pain. Skin: Negative for rash. Neurological: Negative for headaches, focal weakness or numbness.  ____________________________________________   PHYSICAL EXAM:  VITAL SIGNS: ED Triage Vitals  Enc Vitals Group     BP 06/20/20 1822 (!) 158/86     Pulse Rate 06/20/20 1822 86     Resp 06/20/20 1822 20     Temp 06/20/20 1822 98.3 F (36.8 C)     Temp Source 06/20/20 1822 Oral     SpO2 06/20/20 1822 98 %     Weight 06/20/20 1823 (!) 318 lb (144.2 kg)     Height 06/20/20 1823 5\' 4"  (1.626 m)     Head Circumference --      Peak Flow --      Pain Score 06/20/20 1823 0     Pain Loc --      Pain Edu? --      Excl. in [place]? --     Constitutional: Alert and oriented. Eyes: Conjunctivae are normal. Head: Atraumatic. Nose: No congestion/rhinnorhea. Mouth/Throat: Mucous membranes are moist. Neck: Normal ROM Cardiovascular: Normal rate, regular rhythm. Grossly normal heart sounds.  2+ radial pulses bilaterally. Respiratory: Normal respiratory effort.  No retractions. Lungs CTAB. Gastrointestinal: Soft and nontender. No distention. Genitourinary: deferred Musculoskeletal: No  lower extremity tenderness nor edema. Neurologic:  Normal speech and language. No gross focal neurologic deficits are appreciated. Skin:  Skin is warm, dry and intact. No rash noted. Psychiatric: Mood and affect are normal. Speech and behavior are normal.  ____________________________________________   LABS (all labs ordered are listed, but only abnormal results are displayed)  Labs Reviewed  RESP PANEL BY RT-PCR (FLU A&B, COVID) ARPGX2 - Abnormal; Notable for the following components:      Result Value   SARS Coronavirus 2 by RT PCR POSITIVE (*)    All other components within normal limits  CBC WITH DIFFERENTIAL/PLATELET  BASIC METABOLIC PANEL  TROPONIN I (HIGH SENSITIVITY)  TROPONIN I (HIGH SENSITIVITY)   ____________________________________________  EKG  ED ECG REPORT I, Blake Divine, the attending physician, personally viewed and interpreted this ECG.   Date: 06/20/2020  EKG Time: 20:17  Rate: 70  Rhythm: normal sinus rhythm, single PVC noted  Axis: Normal  Intervals:none  ST&T Change: None   PROCEDURES  Procedure(s) performed (including Critical Care):  Procedures   ____________________________________________   INITIAL IMPRESSION / ASSESSMENT AND PLAN / ED COURSE      40 year old female with past medical history of hypertension, hyperlipidemia, asthma, and CKD who presents to the ED for 3 weeks of cough, chest pain, and shortness of breath.  Patient is not in any respiratory distress and is maintaining O2 sats on room air.  Patient reports testing positive for COVID on an at home test earlier today, we will confirm with PCR.  EKG shows no evidence of arrhythmia, we will screen troponin but low suspicion for ACS.  We will also check chest x-ray, CBC, and BMP.  Symptoms likely related to COVID-19 and if work-up is unremarkable, patient be appropriate for discharge home with PCP follow-up.  Unfortunately, she is not a candidate for Paxlovid given onset of  symptoms was greater than 5 days ago.  EKG shows no evidence of arrhythmia or ischemia.  Chest x-ray reviewed by me and shows no infiltrate, edema, or effusion.  Patient is not in any respiratory distress and continues to maintain  O2 sats on room air.  PCR testing for COVID-19 did come back positive and this is likely the source of her symptomatology.  Additional labs are pending at this time and patient to be turned over to oncoming provider pending lab results.  If these are unremarkable, patient would be appropriate for discharge home with PCP follow-up.      ____________________________________________   FINAL CLINICAL IMPRESSION(S) / ED DIAGNOSES  Final diagnoses:  COVID-19  Chest pain, unspecified type     ED Discharge Orders     None        Note:  This document was prepared using Dragon voice recognition software and may include unintentional dictation errors.    Blake Divine, MD 06/20/20 2159

## 2020-06-20 NOTE — ED Triage Notes (Signed)
Pt via POV from home. Pt c/o COVID like symptoms, including cough, body aches, and headache. Pt has had these symptoms for 3 weeks. Pt took a rapid test at work that was positive. Pt is A&Ox4 and NAD.

## 2020-06-20 NOTE — Telephone Encounter (Signed)
Pt has appt tomorrow with Sowles but is asking for advise or if can be helped today, Please advise

## 2020-06-21 ENCOUNTER — Encounter: Payer: Self-pay | Admitting: Family Medicine

## 2020-06-21 ENCOUNTER — Ambulatory Visit: Payer: 59 | Admitting: Family Medicine

## 2020-06-21 ENCOUNTER — Telehealth (INDEPENDENT_AMBULATORY_CARE_PROVIDER_SITE_OTHER): Payer: 59 | Admitting: Family Medicine

## 2020-06-22 NOTE — Progress Notes (Signed)
Cancelled.  

## 2020-06-23 ENCOUNTER — Other Ambulatory Visit: Payer: Self-pay | Admitting: Family Medicine

## 2020-06-23 DIAGNOSIS — J4530 Mild persistent asthma, uncomplicated: Secondary | ICD-10-CM

## 2020-06-23 NOTE — Telephone Encounter (Signed)
   Notes to clinic: script last filled on 02/08/2020 Review for continued use and refill    Requested Prescriptions  Pending Prescriptions Disp Refills   albuterol (VENTOLIN HFA) 108 (90 Base) MCG/ACT inhaler [Pharmacy Med Name: Albuterol Sulfate HFA 108 (90 Base) MCG/ACT Inhalation Aerosol Solution] 18 g 0    Sig: INHALE 2 PUFFS BY MOUTH EVERY 6 HOURS AS NEEDED FOR WHEEZING AND FOR SHORTNESS OF BREATH      Pulmonology:  Beta Agonists Failed - 06/23/2020 10:53 AM      Failed - One inhaler should last at least one month. If the patient is requesting refills earlier, contact the patient to check for uncontrolled symptoms.      Passed - Valid encounter within last 12 months    Recent Outpatient Visits           2 days ago Erroneous encounter - disregard   [facility] Steele Sizer, MD   2 weeks ago SOB (shortness of breath)   [facility] Steele Sizer, MD   3 weeks ago SOB (shortness of breath)   [facility] Steele Sizer, MD   4 months ago OSA on CPAP   [facility] Steele Sizer, MD   7 months ago Flank pain   [facility] Towanda Malkin, MD       Future Appointments             In 1 month Steele Sizer, MD [facility], [facility]

## 2020-06-23 NOTE — Telephone Encounter (Signed)
Last seen 6.1.2022 next sch'd 8.2.2022

## 2020-06-27 ENCOUNTER — Other Ambulatory Visit: Payer: Self-pay | Admitting: Family Medicine

## 2020-06-27 DIAGNOSIS — E876 Hypokalemia: Secondary | ICD-10-CM

## 2020-06-28 ENCOUNTER — Other Ambulatory Visit: Payer: Self-pay | Admitting: Family Medicine

## 2020-06-28 DIAGNOSIS — R52 Pain, unspecified: Secondary | ICD-10-CM

## 2020-06-28 DIAGNOSIS — E876 Hypokalemia: Secondary | ICD-10-CM

## 2020-06-29 ENCOUNTER — Other Ambulatory Visit: Payer: Self-pay | Admitting: Emergency Medicine

## 2020-06-29 ENCOUNTER — Other Ambulatory Visit: Payer: Self-pay | Admitting: Family Medicine

## 2020-06-29 DIAGNOSIS — E876 Hypokalemia: Secondary | ICD-10-CM

## 2020-06-29 DIAGNOSIS — R52 Pain, unspecified: Secondary | ICD-10-CM

## 2020-07-01 LAB — BASIC METABOLIC PANEL
BUN/Creatinine Ratio: 7 (calc) (ref 6–22)
BUN: 9 mg/dL (ref 7–25)
CO2: 27 mmol/L (ref 20–32)
Calcium: 9.3 mg/dL (ref 8.6–10.2)
Chloride: 105 mmol/L (ref 98–110)
Creat: 1.32 mg/dL — ABNORMAL HIGH (ref 0.50–1.10)
Glucose, Bld: 108 mg/dL — ABNORMAL HIGH (ref 65–99)
Potassium: 4 mmol/L (ref 3.5–5.3)
Sodium: 141 mmol/L (ref 135–146)

## 2020-08-04 ENCOUNTER — Encounter: Payer: Self-pay | Admitting: Family Medicine

## 2020-08-08 NOTE — Progress Notes (Signed)
Name: Barbara Pennington   MRN: [id number]    DOB: 01-Oct-1980   Date:08/08/2020       Progress Note  Subjective  Chief Complaint  Follow Up/ Medication Refill  Virtual Visit via Telephone Note  I connected with Barbara Pennington on 08/09/20 at  8:20 AM EDT by telephone and verified that I am speaking with the correct person using two identifiers.  Location: Patient: at work , alone  Provider: at [facility]    I discussed the limitations, risks, security and privacy concerns of performing an evaluation and management service by telephone and the availability of in person appointments. I also discussed with the patient that there may be a patient responsible charge related to this service. The patient expressed understanding and agreed to proceed.    HPI  Psychiatric Concerns: She was going to  [facility] for care - Rx'd Lexapro and Wellbutrin.  She states does not have time in her schedule for counseling at this time.  She was diagnosed with BPD, PTSD, Depression, and Anxiety. She has always struggled with anxiety, but was much worse after she developed PTSD in 2009/2010.  She has had 3 prior suicide attempts - she has attempted overdose on each occasion with her own medications  She was not hospitalized but did seek help with the national suicide hotline. She is now doing group therapy. She is complaint with psychiatric visits. She states difficulty scheduling visits due to being busy at work    HTN: She was diagnosed in the ER in November 2019 and was but on HCTZ '25mg'$ .  She had hypokalemia and bp during visit with nephrologist was not at goal, we will add losartan 25 mg and we will recheck bp next visit. No chest pain , sob .    Morbid Obesity: In high school she was about 180lbs, after having her children she reached her heaviest weight at 260 lbs. In 2009/2010 -  She states her weight yesterday was up to 324 lbs , she just joined the gym last week. She is trying to eat smaller portions and get more frozen  veggies    Chronic Low Back Pain: going on for year, taking tizanidine at night and it helps with symptoms.    OSA/Asthma:  She is using her CPAP machine regularly, she feels tired during the day, she has been out of Provigil, she states medication really helps her and we will send a refill today    Dyslipidemia: She is taking atorvastatin and no concerns at this time; no myalgias, no chest pain, we will recheck labs next vsiit.    CKI stage III: she is avoiding NSAID's, taking tylenol only. She sees nephrologist, she has secondary hyperparathyroidism. She denies pruritus she has good urine output. 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   Mastitis, left, acute 12/20/2016   Essential hypertension     Past Surgical History:  Procedure Laterality Date   ABDOMINAL HYSTERECTOMY  2008  BREAST BIOPSY Left 2013   CORE - FIBROADENOMA VS PHYLLODES   CESAREAN SECTION  2007   COLONOSCOPY WITH PROPOFOL N/A 10/09/2016   Procedure: COLONOSCOPY WITH PROPOFOL;  Surgeon: Jonathon Bellows, MD;  Location: [facility] ENDOSCOPY;  Service: Gastroenterology;  Laterality: N/A;   COLONOSCOPY WITH PROPOFOL N/A 11/09/2019   Procedure: COLONOSCOPY WITH PROPOFOL;  Surgeon: Lin Landsman, MD;  Location: [facility] ENDOSCOPY;  Service: Gastroenterology;  Laterality: N/A;   GUM SURGERY  2004   PLANTAR FASCIA RELEASE Left 08/19/2014   Procedure: ENDOSCOPIC PLANTAR FASCIOTOMY RELEASE L WITH TOPAZ;   Surgeon: Albertine Patricia, DPM;  Location: [place];  Service: Podiatry;  Laterality: Left;  LMA WITH POPLITEAL BLOCK   TONSILLECTOMY  1984    Family History  Problem Relation Age of Onset   Testicular cancer Brother    Colon cancer Paternal Grandfather    Breast cancer Maternal Grandmother    Hypertension Maternal Grandmother    Aneurysm Maternal Grandmother    Stroke Maternal Grandmother    Obesity Mother    Diabetes Mother    High Cholesterol Mother    Hypertension Mother    Neuropathy Mother    Depression Mother    Anxiety disorder Mother    Obesity Father    Hypertension Father    High Cholesterol Father    Depression Father    Anxiety disorder Father    Hypertension Maternal Grandfather    High Cholesterol Maternal Grandfather    Heart disease Maternal Grandfather        has a pacemaker   Stroke Maternal Grandfather    Alzheimer's disease Paternal Grandmother    Dementia Paternal Grandmother     Social History   Tobacco Use   Smoking status: Former    Packs/day: 0.25    Years: 10.00    Pack years: 2.50    Types: Cigarettes    Quit date: 07/05/2014    Years since quitting: 6.0   Smokeless tobacco: Never  Substance Use Topics   Alcohol use: Yes    Comment: rare     Current Outpatient Medications:    acetaminophen (TYLENOL) 325 MG tablet, Take 650 mg by mouth every 6 (six) hours as needed. , Disp: , Rfl:    albuterol (VENTOLIN HFA) 108 (90 Base) MCG/ACT inhaler, INHALE 2 PUFFS BY MOUTH EVERY 6 HOURS AS NEEDED FOR WHEEZING AND FOR SHORTNESS OF BREATH, Disp: 18 g, Rfl: 0   atorvastatin (LIPITOR) 20 MG tablet, Take 1 tablet (20 mg total) by mouth daily., Disp: 90 tablet, Rfl: 1   benzonatate (TESSALON PERLES) 100 MG capsule, Take 1 capsule (100 mg total) by mouth 3 (three) times daily as needed for cough., Disp: 30 capsule, Rfl: 0   buPROPion (WELLBUTRIN XL) 300 MG 24 hr tablet, Take 300 mg by mouth every morning., Disp: , Rfl:    cholecalciferol  (VITAMIN D3) 25 MCG (1000 UNIT) tablet, Take 1,000 Units by mouth daily., Disp: , Rfl:    escitalopram (LEXAPRO) 20 MG tablet, Take 1 tablet (20 mg total) by mouth daily., Disp: 30 tablet, Rfl: 0   Fluticasone-Salmeterol (ADVAIR DISKUS) 100-50 MCG/DOSE AEPB, Inhale 1 puff into the lungs in the morning and at bedtime., Disp: 60 each, Rfl: 3   hydrochlorothiazide (HYDRODIURIL) 25 MG tablet, Take 1 tablet (25 mg total) by mouth daily., Disp: 90 tablet, Rfl: 1   hydrOXYzine (ATARAX/VISTARIL) 50 MG tablet, Take 1 tablet (50 mg total) by mouth 2 (two) times daily., Disp: 60 tablet, Rfl: 5  linaclotide (LINZESS) 290 MCG CAPS capsule, Take 1 capsule (290 mcg total) by mouth daily before breakfast., Disp: 90 capsule, Rfl: 1   potassium chloride SA (KLOR-CON) 20 MEQ tablet, Take 1 tablet (20 mEq total) by mouth 2 (two) times daily for 7 days., Disp: 14 tablet, Rfl: 0   tiZANidine (ZANAFLEX) 4 MG tablet, Take 1 tablet (4 mg total) by mouth every 6 (six) hours as needed for muscle spasms., Disp: 30 tablet, Rfl: 0  Allergies  Allergen Reactions   Latex Rash    I personally reviewed active problem list, medication list, allergies, family history, social history, health maintenance with the patient/caregiver today.   ROS  Ten systems reviewed and is negative except as mentioned in HPI   Objective  There were no vitals filed for this visit.  There is no height or weight on file to calculate BMI.  Physical Exam  Awake, alert and oriented   Recent Results (from the past 2160 hour(s))  Novel Coronavirus, NAA ([facility])     Status: None   Collection Time: 05/31/20 12:00 AM   Specimen: Nasopharyngeal(NP) swabs in vial transport medium   Nasopharynge  Result Value Ref Range   SARS-CoV-2, NAA Not Detected Not Detected    Comment: This nucleic acid amplification test was developed and its performance characteristics determined by [employer]. Nucleic acid amplification tests include RT-PCR  and TMA. This test has not been FDA cleared or approved. This test has been authorized by FDA under an Emergency Use Authorization (EUA). This test is only authorized for the duration of time the declaration that circumstances exist justifying the authorization of the emergency use of in vitro diagnostic tests for detection of SARS-CoV-2 virus and/or diagnosis of COVID-19 infection under section 564(b)(1) of the Act, 21 U.S.C. [id number]) (1), unless the authorization is terminated or revoked sooner. When diagnostic testing is negative, the possibility of a false negative result should be considered in the context of a patient's recent exposures and the presence of clinical signs and symptoms consistent with COVID-19. An individual without symptoms of COVID-19 and who is not shedding SARS-CoV-2 virus wo uld expect to have a negative (not detected) result in this assay.   SARS-COV-2, NAA 2 DAY TAT     Status: None   Collection Time: 05/31/20 12:00 AM   Nasopharynge  Result Value Ref Range   SARS-CoV-2, NAA 2 DAY TAT Performed   CBC with Differential/Platelet     Status: Abnormal   Collection Time: 06/08/20  8:26 AM  Result Value Ref Range   WBC 12.0 (H) 3.8 - 10.8 Thousand/uL   RBC 4.64 3.80 - 5.10 Million/uL   Hemoglobin 14.0 11.7 - 15.5 g/dL   HCT 42.4 35.0 - 45.0 %   MCV 91.4 80.0 - 100.0 fL   MCH 30.2 27.0 - 33.0 pg   MCHC 33.0 32.0 - 36.0 g/dL   RDW 13.3 11.0 - 15.0 %   Platelets 234 140 - 400 Thousand/uL   MPV 9.1 7.5 - 12.5 fL   Neutro Abs 7,920 (H) 1,500 - 7,800 cells/uL   Lymphs Abs 3,240 850 - 3,900 cells/uL   Absolute Monocytes 720 200 - 950 cells/uL   Eosinophils Absolute 36 15 - 500 cells/uL   Basophils Absolute 84 0 - 200 cells/uL   Neutrophils Relative % 66 %   Total Lymphocyte 27.0 %   Monocytes Relative 6.0 %   Eosinophils Relative 0.3 %   Basophils Relative 0.7 %  COMPLETE METABOLIC PANEL WITH GFR  Status: Abnormal   Collection Time: 06/08/20  8:26 AM   Result Value Ref Range   Glucose, Bld 106 (H) 65 - 99 mg/dL    Comment: .            Fasting reference interval . For someone without known diabetes, a glucose value between 100 and 125 mg/dL is consistent with prediabetes and should be confirmed with a follow-up test. .    BUN 21 7 - 25 mg/dL   Creat 1.27 (H) 0.50 - 1.10 mg/dL   GFR, Est Non African American 52 (L) > OR = 60 mL/min/1.67m   GFR, Est African American 61 > OR = 60 mL/min/1.760m  BUN/Creatinine Ratio 17 6 - 22 (calc)   Sodium 140 135 - 146 mmol/L   Potassium 3.3 (L) 3.5 - 5.3 mmol/L   Chloride 102 98 - 110 mmol/L   CO2 27 20 - 32 mmol/L   Calcium 9.4 8.6 - 10.2 mg/dL   Total Protein 6.6 6.1 - 8.1 g/dL   Albumin 4.5 3.6 - 5.1 g/dL   Globulin 2.1 1.9 - 3.7 g/dL (calc)   AG Ratio 2.1 1.0 - 2.5 (calc)   Total Bilirubin 0.3 0.2 - 1.2 mg/dL   Alkaline phosphatase (APISO) 58 31 - 125 U/L   AST 8 (L) 10 - 30 U/L   ALT 17 6 - 29 U/L  C-reactive protein     Status: None   Collection Time: 06/08/20  8:26 AM  Result Value Ref Range   CRP 4.7 <8.0 mg/L  Sedimentation rate     Status: None   Collection Time: 06/08/20  8:26 AM  Result Value Ref Range   Sed Rate 2 0 - 20 mm/h  Resp Panel by RT-PCR (Flu A&B, Covid)     Status: Abnormal   Collection Time: 06/20/20  8:24 PM  Result Value Ref Range   SARS Coronavirus 2 by RT PCR POSITIVE (A) NEGATIVE    Comment: RESULT CALLED TO, READ BACK BY AND VERIFIED WITH: MIMI SAGGART RN '@[id number]'$  06/20/20 SCS    Influenza A by PCR NEGATIVE NEGATIVE   Influenza B by PCR NEGATIVE NEGATIVE    Comment: Performed at [facility] [place] [id number] with Differential     Status: None   Collection Time: 06/20/20  9:38 PM  Result Value Ref Range   WBC 8.1 4.0 - 10.5 K/uL   RBC 4.32 3.87 - 5.11 MIL/uL   Hemoglobin 13.1 12.0 - 15.0 g/dL   HCT 39.3 36.0 - 46.0 %   MCV 91.0 80.0 - 100.0 fL   MCH 30.3 26.0 - 34.0 pg   MCHC 33.3 30.0 - 36.0 g/dL   RDW  13.6 11.5 - 15.5 %   Platelets 184 150 - 400 K/uL   nRBC 0.0 0.0 - 0.2 %   Neutrophils Relative % 49 %   Neutro Abs 4.0 1.7 - 7.7 K/uL   Lymphocytes Relative 36 %   Lymphs Abs 2.9 0.7 - 4.0 K/uL   Monocytes Relative 10 %   Monocytes Absolute 0.8 0.1 - 1.0 K/uL   Eosinophils Relative 4 %   Eosinophils Absolute 0.3 0.0 - 0.5 K/uL   Basophils Relative 1 %   Basophils Absolute 0.1 0.0 - 0.1 K/uL   Immature Granulocytes 0 %   Abs Immature Granulocytes 0.02 0.00 - 0.07 K/uL    Comment: Performed at [facility] [place] [id number]  metabolic panel     Status: Abnormal   Collection Time: 06/20/20  9:38 PM  Result Value Ref Range   Sodium 139 135 - 145 mmol/L   Potassium 2.8 (L) 3.5 - 5.1 mmol/L   Chloride 101 98 - 111 mmol/L   CO2 31 22 - 32 mmol/L   Glucose, Bld 102 (H) 70 - 99 mg/dL    Comment: Glucose reference range applies only to samples taken after fasting for at least 8 hours.   BUN 11 6 - 20 mg/dL   Creatinine, Ser 1.30 (H) 0.44 - 1.00 mg/dL   Calcium 9.0 8.9 - 10.3 mg/dL   GFR, Estimated 53 (L) >60 mL/min    Comment: (NOTE) Calculated using the CKD-EPI Creatinine Equation (2021)    Anion gap 7 5 - 15    Comment: Performed at [facility], [place], [place] [address]  Troponin I (High Sensitivity)     Status: None   Collection Time: 06/20/20  9:38 PM  Result Value Ref Range   Troponin I (High Sensitivity) 4 <18 ng/L    Comment: (NOTE) Elevated high sensitivity troponin I (hsTnI) values and significant  changes across serial measurements may suggest ACS but many other  chronic and acute conditions are known to elevate hsTnI results.  Refer to the "Links" section for chest pain algorithms and additional  guidance. Performed at [facility], [address] [place], [place] [id number]   Basic Metabolic Panel (BMET)     Status: Abnormal   Collection Time: 06/30/20 10:03 AM  Result Value Ref Range    Glucose, Bld 108 (H) 65 - 99 mg/dL    Comment: .            Fasting reference interval . For someone without known diabetes, a glucose value between 100 and 125 mg/dL is consistent with prediabetes and should be confirmed with a follow-up test. .    BUN 9 7 - 25 mg/dL   Creat 1.32 (H) 0.50 - 1.10 mg/dL   BUN/Creatinine Ratio 7 6 - 22 (calc)   Sodium 141 135 - 146 mmol/L   Potassium 4.0 3.5 - 5.3 mmol/L   Chloride 105 98 - 110 mmol/L   CO2 27 20 - 32 mmol/L   Calcium 9.3 8.6 - 10.2 mg/dL     PHQ2/9: Depression screen [facility] 2/9 06/21/2020 06/08/2020 05/31/2020 02/08/2020 11/24/2019  Decreased Interest 0 0 0 1 2  Down, Depressed, Hopeless 0 0 0 0 -  PHQ - 2 Score 0 0 0 1 2  Altered sleeping '1 3 1 1 3  '$ Tired, decreased energy '1 3 1 1 2  '$ Change in appetite 1 1 0 3 2  Feeling bad or failure about yourself  0 0 0 0 0  Trouble concentrating 2 2 0 2 2  Moving slowly or fidgety/restless 1 1 0 0 1  Suicidal thoughts 0 0 0 0 0  PHQ-9 Score '6 10 2 8 12  '$ Difficult doing work/chores Somewhat difficult Somewhat difficult Not difficult at all - Somewhat difficult  Some recent data might be hidden    phq 9 is positive   Fall Risk: Fall Risk  06/21/2020 06/08/2020 05/31/2020 02/08/2020 09/29/2019  Falls in the past year? 1 1 0 1 1  Number falls in past yr: 1 1 0 1 1  Injury with Fall? 0 0 0 1 0  Risk for fall due to : History of fall(s) No Fall Risks - - -  Follow up Falls prevention discussed Falls evaluation completed Falls prevention discussed - Falls evaluation completed      Assessment & Plan  1. Stage 3a chronic kidney disease ([place])   2. Mixed hyperlipidemia  - atorvastatin (LIPITOR) 20 MG tablet; Take 1 tablet (20 mg total) by mouth daily.  Dispense: 90 tablet; Refill: 1  3. Essential hypertension  - hydrochlorothiazide (HYDRODIURIL) 25 MG tablet; Take 1 tablet (25 mg total) by mouth daily.  Dispense: 90 tablet; Refill: 1  4. OSA on CPAP  - modafinil (PROVIGIL) 100 MG  tablet; Take 1 tablet (100 mg total) by mouth daily.  Dispense: 90 tablet; Refill: 0  5. Mild intermittent asthma    6. GAD (generalized anxiety disorder)   7. Moderate episode of recurrent major depressive disorder ([place])   8. Morbid obesity with BMI of 50.0-59.9, adult [facility])  Discussed with the patient the risk posed by an increased BMI. Discussed importance of portion control, calorie counting and at least 150 minutes of physical activity weekly. Avoid sweet beverages and drink more water. Eat at least 6 servings of fruit and vegetables daily    9. Secondary hyperparathyroidism ([place])     I discussed the assessment and treatment plan with the patient. The patient was provided an opportunity to ask questions and all were answered. The patient agreed with the plan and demonstrated an understanding of the instructions.   The patient was advised to call back or seek an in-person evaluation if the symptoms worsen or if the condition fails to improve as anticipated.  I provided 25 minutes of non-face-to-face time during this encounter.   Loistine Chance, MD

## 2020-08-09 ENCOUNTER — Telehealth (INDEPENDENT_AMBULATORY_CARE_PROVIDER_SITE_OTHER): Payer: 59 | Admitting: Family Medicine

## 2020-08-09 ENCOUNTER — Encounter: Payer: Self-pay | Admitting: Family Medicine

## 2020-08-09 DIAGNOSIS — F411 Generalized anxiety disorder: Secondary | ICD-10-CM

## 2020-08-09 DIAGNOSIS — N1831 Chronic kidney disease, stage 3a: Secondary | ICD-10-CM

## 2020-08-09 DIAGNOSIS — G4733 Obstructive sleep apnea (adult) (pediatric): Secondary | ICD-10-CM

## 2020-08-09 DIAGNOSIS — I10 Essential (primary) hypertension: Secondary | ICD-10-CM

## 2020-08-09 DIAGNOSIS — Z9989 Dependence on other enabling machines and devices: Secondary | ICD-10-CM

## 2020-08-09 DIAGNOSIS — E782 Mixed hyperlipidemia: Secondary | ICD-10-CM

## 2020-08-09 DIAGNOSIS — N2581 Secondary hyperparathyroidism of renal origin: Secondary | ICD-10-CM

## 2020-08-09 DIAGNOSIS — Z6841 Body Mass Index (BMI) 40.0 and over, adult: Secondary | ICD-10-CM

## 2020-08-09 DIAGNOSIS — F331 Major depressive disorder, recurrent, moderate: Secondary | ICD-10-CM

## 2020-08-09 DIAGNOSIS — J4521 Mild intermittent asthma with (acute) exacerbation: Secondary | ICD-10-CM

## 2020-08-09 MED ORDER — MODAFINIL 100 MG PO TABS: 100.0000 mg | ORAL_TABLET | Freq: Every day | ORAL | 0 refills | Status: DC

## 2020-08-09 MED ORDER — ATORVASTATIN CALCIUM 20 MG PO TABS: 20.0000 mg | ORAL_TABLET | Freq: Every day | ORAL | 1 refills | Status: DC

## 2020-08-09 MED ORDER — LOSARTAN POTASSIUM 25 MG PO TABS: 25.0000 mg | ORAL_TABLET | Freq: Every day | ORAL | 1 refills | Status: DC

## 2020-08-09 MED ORDER — TIZANIDINE HCL 2 MG PO TABS: 2.0000 mg | ORAL_TABLET | Freq: Every day | ORAL | 1 refills | Status: DC

## 2020-08-09 MED ORDER — HYDROCHLOROTHIAZIDE 25 MG PO TABS: 25.0000 mg | ORAL_TABLET | Freq: Every day | ORAL | 1 refills | Status: DC

## 2020-09-10 ENCOUNTER — Other Ambulatory Visit: Payer: Self-pay | Admitting: Family Medicine

## 2020-09-10 NOTE — Telephone Encounter (Signed)
Requested medication (s) are due for refill today: yes  Requested medication (s) are on the active medication list: no  Last refill:  08/06/19  Future visit scheduled: no  Notes to clinic:  4 mg dose dc 'd 08/29/20 Med not delegated to NT to RF   Requested Prescriptions  Pending Prescriptions Disp Refills   tiZANidine (ZANAFLEX) 4 MG capsule [Pharmacy Med Name: tiZANidine HCL 4 MG CAPSULE] 28 capsule     Sig: TAKE ONE CAPSULE BY MOUTH AT BEDTIME     Not Delegated - Cardiovascular:  Alpha-2 Agonists - tizanidine Failed - 09/10/2020 11:45 AM      Failed - This refill cannot be delegated      Passed - Valid encounter within last 6 months    Recent Outpatient Visits           1 month ago Stage 3a chronic kidney disease [facility])   [facility] Steele Sizer, MD   2 months ago Erroneous encounter - disregard   [facility] Steele Sizer, MD   3 months ago SOB (shortness of breath)   [facility] Steele Sizer, MD   3 months ago SOB (shortness of breath)   [facility] Steele Sizer, MD   7 months ago OSA on CPAP   [facility] Steele Sizer, MD

## 2020-09-13 ENCOUNTER — Other Ambulatory Visit: Payer: Self-pay

## 2020-10-22 ENCOUNTER — Other Ambulatory Visit: Payer: Self-pay | Admitting: Gastroenterology

## 2020-10-22 DIAGNOSIS — K5904 Chronic idiopathic constipation: Secondary | ICD-10-CM

## 2020-10-27 ENCOUNTER — Telehealth: Payer: Self-pay

## 2020-10-27 ENCOUNTER — Other Ambulatory Visit: Payer: Self-pay

## 2020-10-27 ENCOUNTER — Other Ambulatory Visit: Payer: Self-pay | Admitting: Gastroenterology

## 2020-10-27 DIAGNOSIS — K5904 Chronic idiopathic constipation: Secondary | ICD-10-CM

## 2020-10-27 MED ORDER — LINACLOTIDE 290 MCG PO CAPS: 290.0000 ug | ORAL_CAPSULE | Freq: Every day | ORAL | 1 refills | Status: DC

## 2020-10-27 NOTE — Progress Notes (Signed)
Refilled patient med and called her to make an appointment to seen in our office  last office visit was 10/20/2019

## 2020-10-27 NOTE — Telephone Encounter (Signed)
Called patient to tell her she needs an appointment to come in and be seen I sent in a refill of her medicine but we need to see her in office last office visit was 10/19/2020

## 2020-11-28 ENCOUNTER — Other Ambulatory Visit: Payer: Self-pay | Admitting: Family Medicine

## 2020-11-28 DIAGNOSIS — G4733 Obstructive sleep apnea (adult) (pediatric): Secondary | ICD-10-CM

## 2020-11-28 DIAGNOSIS — J4530 Mild persistent asthma, uncomplicated: Secondary | ICD-10-CM

## 2020-11-28 NOTE — Telephone Encounter (Signed)
Requested medication (s) are due for refill today - yes  Requested medication (s) are on the active medication list -yes  Future visit scheduled -no  Last refill: tizanidine- 08/09/20 #100 1RF                 Modafinil- 08/09/20 #90  Notes to clinic: Request RF: non delegated Rx, medication not assigned protocol  Requested Prescriptions  Pending Prescriptions Disp Refills   tiZANidine (ZANAFLEX) 2 MG tablet [Pharmacy Med Name: tiZANidine HCl 2 MG Oral Tablet] 100 tablet 0    Sig: TAKE 1 TO 2 TABLETS BY MOUTH AT BEDTIME     Not Delegated - Cardiovascular:  Alpha-2 Agonists - tizanidine Failed - 11/28/2020 11:09 AM      Failed - This refill cannot be delegated      Passed - Valid encounter within last 6 months    Recent Outpatient Visits           3 months ago Stage 3a chronic kidney disease ([place])   [facility] Sizer, [facility]   5 months ago Erroneous encounter - disregard   [facility] Sizer, MD   5 months ago SOB (shortness of breath)   [facility] [place], Drue Stager, MD   6 months ago SOB (shortness of breath)   [facility] [place], Drue Stager, MD   9 months ago OSA on CPAP   [facility], Drue Stager, MD               modafinil (PROVIGIL) 100 MG tablet [Pharmacy Med Name: Modafinil 100 MG Oral Tablet] 90 tablet 0    Sig: Take 1 tablet by mouth once daily     Off-Protocol Failed - 11/28/2020 11:09 AM      Failed - Medication not assigned to a protocol, review manually.      Passed - Valid encounter within last 12 months    Recent Outpatient Visits           3 months ago Stage 3a chronic kidney disease [facility])   [facility] Sizer, MD   5 months ago Erroneous encounter - disregard   [facility] [place], Drue Stager, MD   5 months ago SOB (shortness of breath)   [facility]izer, [facility]    6 months ago SOB (shortness of breath)   [facility] [place], Drue Stager, MD   9 months ago OSA on CPAP   [facility] [place], Drue Stager, MD              Signed Prescriptions Disp Refills   albuterol (VENTOLIN HFA) 108 (90 Base) MCG/ACT inhaler 18 g 0    Sig: INHALE 2 PUFFS BY MOUTH EVERY 6 HOURS AS NEEDED FOR WHEEZING AND FOR SHORTNESS OF BREATH     Pulmonology:  Beta Agonists Failed - 11/28/2020 11:09 AM      Failed - One inhaler should last at least one month. If the patient is requesting refills earlier, contact the patient to check for uncontrolled symptoms.      Passed - Valid encounter within last 12 months    Recent Outpatient Visits           3 months ago Stage 3a chronic kidney disease [facility])   [facility] Sizer, MD   5 months ago Erroneous encounter - disregard   [facility] Sizer, MD   5  months ago SOB (shortness of breath)   [facility] [place], Drue Stager, MD   6 months ago SOB (shortness of breath)   [facility] [place], Drue Stager, MD   9 months ago OSA on CPAP   [facility] Steele Sizer, MD                 Requested Prescriptions  Pending Prescriptions Disp Refills   tiZANidine (ZANAFLEX) 2 MG tablet [Pharmacy Med Name: tiZANidine HCl 2 MG Oral Tablet] 100 tablet 0    Sig: TAKE 1 TO 2 TABLETS BY MOUTH AT BEDTIME     Not Delegated - Cardiovascular:  Alpha-2 Agonists - tizanidine Failed - 11/28/2020 11:09 AM      Failed - This refill cannot be delegated      Passed - Valid encounter within last 6 months    Recent Outpatient Visits           3 months ago Stage 3a chronic kidney disease [facility])   [facility]izer, MD   5 months ago Erroneous encounter - disregard   [facility] [place], Drue Stager, MD   5 months ago SOB (shortness of breath)   [facility] [place], Drue Stager, MD   6 months ago SOB (shortness of breath)   [facility] [place], Drue Stager, MD   9 months ago OSA on CPAP   [facility] [place], Drue Stager, MD               modafinil (PROVIGIL) 100 MG tablet [Pharmacy Med Name: Modafinil 100 MG Oral Tablet] 90 tablet 0    Sig: Take 1 tablet by mouth once daily     Off-Protocol Failed - 11/28/2020 11:09 AM      Failed - Medication not assigned to a protocol, review manually.      Passed - Valid encounter within last 12 months    Recent Outpatient Visits           3 months ago Stage 3a chronic kidney disease [facility])   [facility]izer, MD   5 months ago Erroneous encounter - disregard   [facility] [place], Dr[facility] Stager, MD   5 months ago SOB (shortness of breath)   [facility], [facility]   6 months ago SOB (shortness of breath)   [facility] [place], Drue Stager, MD   9 months ago OSA on CPAP   [facility] [place], Drue Stager, MD              Signed Prescriptions Disp Refills   albuterol (VENTOLIN HFA) 108 (90 Base) MCG/ACT inhaler 18 g 0    Sig: INHALE 2 PUFFS BY MOUTH EVERY 6 HOURS AS NEEDED FOR WHEEZING AND FOR SHORTNESS OF BREATH     Pulmonology:  Beta Agonists Failed - 11/28/2020 11:09 AM      Failed - One inhaler should last at least one month. If the patient is requesting refills earlier, contact the patient to check for uncontrolled symptoms.      Passed - Valid encounter within last 12 months    Recent Outpatient Visits           3 months ago Stage 3a chronic kidney disease [facility])   [facility]izer, MD   5 months ago Erroneous encounter - disregard   [facility]izer, MD   5  months ago SOB (shortness of breath)   [facility] Sizer, MD   6 months  ago SOB (shortness of breath)   [facility]izer, [facility]   9 months ago OSA on CPAP   [facility] Steele Sizer, MD

## 2020-11-28 NOTE — Telephone Encounter (Signed)
Pt called to report that she is completely out of her current supply of all of these, she also wants to resume 4 MG of Tizanidine please.

## 2020-11-28 NOTE — Telephone Encounter (Signed)
Requested Prescriptions  Pending Prescriptions Disp Refills  . albuterol (VENTOLIN HFA) 108 (90 Base) MCG/ACT inhaler [Pharmacy Med Name: Albuterol Sulfate HFA 108 (90 Base) MCG/ACT Inhalation Aerosol Solution] 18 g 0    Sig: INHALE 2 PUFFS BY MOUTH EVERY 6 HOURS AS NEEDED FOR WHEEZING AND FOR SHORTNESS OF BREATH     Pulmonology:  Beta Agonists Failed - 11/28/2020 11:09 AM      Failed - One inhaler should last at least one month. If the patient is requesting refills earlier, contact the patient to check for uncontrolled symptoms.      Passed - Valid encounter within last 12 months    Recent Outpatient Visits          3 months ago Stage 3a chronic kidney disease [facility])   [facility]izer, MD   5 months ago Erroneous encounter - disregard   [facility] [place], Drue Stager, MD   5 months ago SOB (shortness of breath)   [facility]izer, MD   6 months ago SOB (shortness of breath)   [facility] [place], Drue Stager, MD   9 months ago OSA on CPAP   [facility] [place], Drue Stager, MD             . tiZANidine (ZANAFLEX) 2 MG tablet [Pharmacy Med Name: tiZANidine HCl 2 MG Oral Tablet] 100 tablet 0    Sig: TAKE 1 TO 2 TABLETS BY MOUTH AT BEDTIME     Not Delegated - Cardiovascular:  Alpha-2 Agonists - tizanidine Failed - 11/28/2020 11:09 AM      Failed - This refill cannot be delegated      Passed - Valid encounter within last 6 months    Recent Outpatient Visits          3 months ago Stage 3a chronic kidney disease [facility])   [facility]izer, MD   5 months ago Erroneous encounter - disregard   [facility] [place], Dr[facility] Stager, MD   5 months ago SOB (shortness of breath)   [facility] [place], Dr[facility] Stager, MD   6 months ago SOB (shortness of breath)   [facility] [place], Dr[facility]ager, MD   9 months  ago OSA on CPAP   [facility] [place], Drue Stager, MD             . modafinil (PROVIGIL) 100 MG tablet [Pharmacy Med Name: Modafinil 100 MG Oral Tablet] 90 tablet 0    Sig: Take 1 tablet by mouth once daily     Off-Protocol Failed - 11/28/2020 11:09 AM      Failed - Medication not assigned to a protocol, review manually.      Passed - Valid encounter within last 12 months    Recent Outpatient Visits          3 months ago Stage 3a chronic kidney disease [facility])   [facility]izer, MD   5 months ago Erroneous encounter - disregard   [facility], [facility]   5 months ago SOB (shortness of breath)   [facility], [facility]   6 months ago SOB (shortness of breath)   [facility]izer, MD   9 months ago OSA on CPAP   [facility] Steele Sizer, MD

## 2020-12-04 ENCOUNTER — Encounter: Payer: Self-pay | Admitting: Family Medicine

## 2020-12-05 ENCOUNTER — Other Ambulatory Visit: Payer: Self-pay

## 2020-12-05 DIAGNOSIS — G4733 Obstructive sleep apnea (adult) (pediatric): Secondary | ICD-10-CM

## 2020-12-05 MED ORDER — LOSARTAN POTASSIUM 25 MG PO TABS: 25.0000 mg | ORAL_TABLET | Freq: Every day | ORAL | 0 refills | Status: DC

## 2020-12-05 MED ORDER — ATORVASTATIN CALCIUM 20 MG PO TABS: 20.0000 mg | ORAL_TABLET | Freq: Every day | ORAL | 0 refills | Status: DC

## 2020-12-05 NOTE — Progress Notes (Signed)
Name: Barbara Pennington   MRN: [id number]    DOB: [birth year]   Date:12/06/2020       Progress Note  Subjective  Chief Complaint  Medication Refill  HPI  URI-type symptoms: states on Sat she was very tired and slept most of the day. States she is experiencing increased sneezing and mild SOB with increased inhaler use. Denies productive cough. States at-home covid test was negative. Reports that she was having chest pains. States she is still feeling mildly short of breath today.   Edema: States legs are swelling much more intensely over the last month. Reports that shoes are not fitting well and legs are hurting from swelling.   Psychiatric concerns/modafinil: She has not been able to consistently see her therapist but is able to keep up with psychiatrist. Modafinil:patient states she has been out of this for awhile and still feels sleepy during the day. Has been monitoring her CPAP and notes an increase in the number of incidents per hour. States she has been a bit more moody lately without it.   HTN/ Chronic kidney disease: States her BP at home was 166/106 over the weekend. She states she is following up with Nephrology for monitoring. Nephrology has expressed concern for her weight and is encouraging weight loss to assist with kidney function.   Memory: States she feels she is having trouble remembering meeting people, movies, things at work. States her significant other has noticed these slips being more frequent. Paternal grandmother had some form of dementia. Does admit to being under stress at this time.   Morbid Obesity/ Metabolic Syndrome: Discussed efforts towards increased exercise. Patient states she is trying to walk more during the day but has not been going to the gym like previously discussed. States she has tried Metformin in the past to assist with metabolic syndrome but this was not beneficial for her, caused bloating. She is amenable to trying Rybelsus or Trulicity today.    Chronic back pain: Patient requests refill of Tizanidine for back pain.    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 END[facility]COPY;  Service: Gastroenterology;  Laterality: N/A;   COLONOSCOPY WITH PROPOFOL N/A 11/09/2019   Procedure: COLONOSCOPY WITH PROPOFOL;  Surgeon: Lin Landsman, MD;  Location: [facility] ENDOSCOPY;  Service: Gastroenterology;  Laterality: N/A;   GUM SURGERY  2004   PLANTAR FASCIA RELEASE Left 08/19/2014   Procedure: ENDOSCOPIC PLANTAR FASCIOTOMY RELEASE L WITH TOPAZ;  Surgeon: Albertine Patricia, DPM;  Location: [place];  Service: Podiatry;  Laterality: Left;  [place]    Family  History  Problem Relation Age of Onset   Testicular cancer Brother    Colon cancer Paternal Grandfather    Breast cancer Maternal Grandmother    Hypertension Maternal Grandmother    Aneurysm Maternal Grandmother    Stroke Maternal Grandmother    Obesity Mother    Diabetes Mother    High Cholesterol Mother    Hypertension Mother    Neuropathy Mother    Depression  Mother    Anxiety disorder Mother    Obesity Father    Hypertension Father    High Cholesterol Father    Depression Father    Anxiety disorder Father    Hypertension Maternal Grandfather    High Cholesterol Maternal Grandfather    Heart disease Maternal Grandfather        has a pacemaker   Stroke Maternal Grandfather    Alzheimer's disease Paternal Grandmother    Dementia Paternal Grandmother     Social History   Tobacco Use   Smoking status: Former    Packs/day: 0.25    Years: 10.00    Pack years: 2.50    Types: Cigarettes    Quit date: 07/05/2014    Years since quitting: 6.4   Smokeless tobacco: Never  Substance Use Topics   Alcohol use: Yes    Comment: rare     Current Outpatient Medications:    acetaminophen (TYLENOL) 325 MG tablet, Take 650 mg by mouth every 6 (six) hours as needed. , Disp: , Rfl:    albuterol (VENTOLIN HFA) 108 (90 Base) MCG/ACT inhaler, INHALE 2 PUFFS BY MOUTH EVERY 6 HOURS AS NEEDED FOR WHEEZING AND FOR SHORTNESS OF BREATH, Disp: 18 g, Rfl: 0   atorvastatin (LIPITOR) 20 MG tablet, Take 1 tablet (20 mg total) by mouth daily., Disp: 90 tablet, Rfl: 0   buPROPion (WELLBUTRIN XL) 300 MG 24 hr tablet, Take 300 mg by mouth every morning., Disp: , Rfl:    cholecalciferol (VITAMIN D3) 25 MCG (1000 UNIT) tablet, Take 1,000 Units by mouth daily., Disp: , Rfl:    escitalopram (LEXAPRO) 20 MG tablet, Take 1 tablet (20 mg total) by mouth daily., Disp: 30 tablet, Rfl: 0   Fluticasone-Salmeterol (ADVAIR DISKUS) 100-50 MCG/DOSE AEPB, Inhale 1 puff into the lungs in the morning and at bedtime., Disp: 60 each, Rfl: 3   hydrochlorothiazide (HYDRODIURIL) 25 MG tablet, Take 1 tablet (25 mg total) by mouth daily., Disp: 90 tablet, Rfl: 1   hydrOXYzine (ATARAX/VISTARIL) 50 MG tablet, Take 1 tablet (50 mg total) by mouth 2 (two) times daily., Disp: 60 tablet, Rfl: 5   linaclotide (LINZESS) 290 MCG CAPS capsule, Take 1 capsule (290 mcg total) by mouth daily before  breakfast., Disp: 90 capsule, Rfl: 1   Semaglutide (RYBELSUS) 7 MG TABS, Take 7 mg by mouth daily., Disp: 90 tablet, Rfl: 0   losartan (COZAAR) 25 MG tablet, Take 1 tablet (25 mg total) by mouth daily., Disp: 90 tablet, Rfl: 0   modafinil (PROVIGIL) 100 MG tablet, Take 1 tablet (100 mg total) by mouth daily., Disp: 90 tablet, Rfl: 0   tiZANidine (ZANAFLEX) 2 MG tablet, Take 1-2 tablets (2-4 mg total) by mouth at bedtime., Disp: 100 tablet, Rfl: 1  Allergies  Allergen Reactions   Latex Rash    I personally reviewed active problem list, medication list, allergies, family history, social history, health maintenance with the patient/caregiver today.   Review of Systems  Respiratory:  Positive for shortness of breath.   Cardiovascular:  Positive for leg swelling.  Objective  Vitals:   12/06/20 0927  BP: 132/80  Pulse: 93  Resp: 16  Temp: 98.2 F (36.8 C)  SpO2: 97%  Weight: (!) 330 lb (149.7 kg)  Height: 5\' 4"  (1.626 m)    Body mass index is 56.64 kg/m.  Constitutional: Patient appears well-developed and well-nourished. Obese  No distress.  HEENT: head atraumatic, normocephalic, pupils equal and reactive to light, neck supple Cardiovascular: Normal rate, regular rhythm and normal heart sounds.  No murmur heard. Positive for non pitting BLE edema. Pulmonary/Chest: Effort normal and breath sounds normal. No respiratory distress. Abdominal: Soft.  There is no tenderness. Psychiatric: Patient has a normal mood and affect. behavior is normal. Judgment and thought content normal.    PHQ2/9: Depression screen [facility] 2/9 12/06/2020 08/09/2020 06/21/2020 06/08/2020 05/31/2020  Decreased Interest 0 0 0 0 0  Down, Depressed, Hopeless 0 0 0 0 0  PHQ - 2 Score 0 0 0 0 0  Altered sleeping 3 0 1 3 1   Tired, decreased energy 3 1 1 3 1   Change in appetite 0 0 1 1 0  Feeling bad or failure about yourself  0 0 0 0 0  Trouble concentrating 1 1 2 2  0  Moving slowly or fidgety/restless 0 0 1 1  0  Suicidal thoughts 0 0 0 0 0  PHQ-9 Score 7 2 6 10 2   Difficult doing work/chores - - Somewhat difficult Somewhat difficult Not difficult at all  Some recent data might be hidden    phq 9 is positive   Fall Risk: Fall Risk  12/06/2020 08/09/2020 06/21/2020 06/08/2020 05/31/2020  Falls in the past year? 1 0 1 1 0  Number falls in past yr: 1 0 1 1 0  Injury with Fall? 0 0 0 0 0  Risk for fall due to : No Fall Risks - History of fall(s) No Fall Risks -  Follow up Falls prevention discussed - Falls prevention discussed Falls evaluation completed Falls prevention discussed      Functional Status Survey: Is the patient deaf or have difficulty hearing?: No Does the patient have difficulty seeing, even when wearing glasses/contacts?: Yes Does the patient have difficulty concentrating, remembering, or making decisions?: Yes Does the patient have difficulty walking or climbing stairs?: Yes Does the patient have difficulty dressing or bathing?: No Does the patient have difficulty doing errands alone such as visiting a doctor's office or shopping?: No    Assessment & Plan  Problem List Items Addressed This Visit     Essential hypertension    Refill of losartan provided today Take BP at home once per day and record it  Bring records to next PCP appointment.       Relevant Medications   losartan (COZAAR) 25 MG tablet   Low back pain    Refill of Tizanidine provided today       Relevant Medications   tiZANidine (ZANAFLEX) 2 MG tablet   Morbid obesity with BMI of 50.0-59.9, adult ([place]) - Primary    Reviewed exercise efforts and increasing physical activity Samples Rybelsus and voucher provided today.  Education for use provided.       Relevant Medications   modafinil (PROVIGIL) 100 MG tablet   Semaglutide (RYBELSUS) 7 MG TABS   Other Relevant Orders   COMPLETE METABOLIC PANEL WITH GFR   HgB A1c   OSA on CPAP    Refill for Modafinil provided today.  Continue CPAP use  Relevant Medications   modafinil (PROVIGIL) 100 MG tablet   Other Relevant Orders   CBC w/Diff/Platelet   CKD (chronic kidney disease) stage 3, GFR 30-59 ml/min (HCC)    Kor[place]ati is following with Nephrology  Will provide Rybelsus to assist with weight loss       Relevant Orders   COMPLETE METABOLIC PANEL WITH GFR   CBC w/Diff/Platelet   Impaired fasting glucose    Will continue monitoring labs for glucose levels Provided sample of Rybelsus to assist with weight loss and voucher       Relevant Medications   Semaglutide (RYBELSUS) 7 MG TABS   Other Relevant Orders   COMPLETE METABOLIC PANEL WITH GFR   Lymphedema    Discussed weight loss, change to stand up desk at work and pump legs while sitting. Discussed referral to vascular surgeon but she would like to try losing weight first      Mixed hyperlipidemia   Relevant Medications   losartan (COZAAR) 25 MG tablet   Other Relevant Orders   Lipid Profile   Other Visit Diagnoses     Need for immunization against influenza       Relevant Orders   Flu Vaccine QUAD 6+ mos PF IM (Fluarix Quad PF) (Completed)   Encounter for long-term current use of medication       Relevant Orders   Lipid Profile

## 2020-12-06 ENCOUNTER — Ambulatory Visit (INDEPENDENT_AMBULATORY_CARE_PROVIDER_SITE_OTHER): Payer: 59 | Admitting: Family Medicine

## 2020-12-06 ENCOUNTER — Telehealth: Payer: Self-pay

## 2020-12-06 ENCOUNTER — Encounter: Payer: Self-pay | Admitting: Family Medicine

## 2020-12-06 VITALS — BP 132/80 | HR 93 | Temp 98.2°F | Resp 16 | Ht 64.0 in | Wt 330.0 lb

## 2020-12-06 DIAGNOSIS — N1831 Chronic kidney disease, stage 3a: Secondary | ICD-10-CM | POA: Diagnosis not present

## 2020-12-06 DIAGNOSIS — Z23 Encounter for immunization: Secondary | ICD-10-CM | POA: Diagnosis not present

## 2020-12-06 DIAGNOSIS — Z6841 Body Mass Index (BMI) 40.0 and over, adult: Secondary | ICD-10-CM

## 2020-12-06 DIAGNOSIS — R7301 Impaired fasting glucose: Secondary | ICD-10-CM | POA: Insufficient documentation

## 2020-12-06 DIAGNOSIS — Z9989 Dependence on other enabling machines and devices: Secondary | ICD-10-CM

## 2020-12-06 DIAGNOSIS — I890 Lymphedema, not elsewhere classified: Secondary | ICD-10-CM

## 2020-12-06 DIAGNOSIS — I10 Essential (primary) hypertension: Secondary | ICD-10-CM

## 2020-12-06 DIAGNOSIS — Z79899 Other long term (current) drug therapy: Secondary | ICD-10-CM

## 2020-12-06 DIAGNOSIS — G4733 Obstructive sleep apnea (adult) (pediatric): Secondary | ICD-10-CM

## 2020-12-06 DIAGNOSIS — E782 Mixed hyperlipidemia: Secondary | ICD-10-CM

## 2020-12-06 DIAGNOSIS — G8929 Other chronic pain: Secondary | ICD-10-CM

## 2020-12-06 DIAGNOSIS — M545 Low back pain, unspecified: Secondary | ICD-10-CM

## 2020-12-06 MED ORDER — LOSARTAN POTASSIUM 25 MG PO TABS: 25.0000 mg | ORAL_TABLET | Freq: Every day | ORAL | 0 refills | Status: DC

## 2020-12-06 MED ORDER — TIZANIDINE HCL 2 MG PO TABS: 2.0000 mg | ORAL_TABLET | Freq: Every day | ORAL | 1 refills | Status: DC

## 2020-12-06 MED ORDER — RYBELSUS 7 MG PO TABS: 7.0000 mg | ORAL_TABLET | Freq: Every day | ORAL | 0 refills | Status: DC

## 2020-12-06 MED ORDER — MODAFINIL 100 MG PO TABS: 100.0000 mg | ORAL_TABLET | Freq: Every day | ORAL | 0 refills | Status: DC

## 2020-12-06 NOTE — Telephone Encounter (Signed)
Pt is checking to see if you called Rybelsus and tizanidine. I only see 2 prescriptions called in today and she said it should have been 4. Please send to [facility]

## 2020-12-06 NOTE — Assessment & Plan Note (Addendum)
Reviewed exercise efforts and increasing physical activity Samples Rybelsus and voucher provided today.  Education for use provided.

## 2020-12-06 NOTE — Assessment & Plan Note (Signed)
Refill of losartan provided today Take BP at home once per day and record it  Bring records to next PCP appointment.

## 2020-12-06 NOTE — Assessment & Plan Note (Signed)
Will continue monitoring labs for glucose levels Provided sample of Rybelsus to assist with weight loss and voucher

## 2020-12-06 NOTE — Patient Instructions (Signed)
Continue exercise efforts and increasing movement.  Continue CPAP use everynight

## 2020-12-06 NOTE — Assessment & Plan Note (Signed)
Refill for Modafinil provided today.  Continue CPAP use

## 2020-12-06 NOTE — Assessment & Plan Note (Signed)
Refill of Tizanidine provided today

## 2020-12-06 NOTE — Assessment & Plan Note (Signed)
Barbara Pennington is following with Nephrology  Will provide Rybelsus to assist with weight loss

## 2020-12-06 NOTE — Assessment & Plan Note (Signed)
Discussed weight loss, change to stand up desk at work and pump legs while sitting. Discussed referral to vascular surgeon but she would like to try losing weight first

## 2020-12-06 NOTE — Telephone Encounter (Signed)
Copied from [place] [id number]. Topic: General - Call Back - No Documentation >> Dec 06, 2020 11:19 AM Barbara Pennington wrote: Reason for CRM: Pt stated she had a missed call so she was just returning the call. Pt stated she received a discount card for a free trial of Rybelsus but it requires that  the Rx be for either 7 mg or 14 mg. Pt requests call back

## 2020-12-07 ENCOUNTER — Encounter: Payer: Self-pay | Admitting: Family Medicine

## 2020-12-07 LAB — CBC WITH DIFFERENTIAL/PLATELET
Absolute Monocytes: 577 cells/uL (ref 200–950)
Basophils Absolute: 80 cells/uL (ref 0–200)
Basophils Relative: 1.1 %
Eosinophils Absolute: 321 cells/uL (ref 15–500)
Eosinophils Relative: 4.4 %
HCT: 44.7 % (ref 35.0–45.0)
Hemoglobin: 15 g/dL (ref 11.7–15.5)
Lymphs Abs: 2373 cells/uL (ref 850–3900)
MCH: 30.1 pg (ref 27.0–33.0)
MCHC: 33.6 g/dL (ref 32.0–36.0)
MCV: 89.6 fL (ref 80.0–100.0)
MPV: 9.5 fL (ref 7.5–12.5)
Monocytes Relative: 7.9 %
Neutro Abs: 3949 cells/uL (ref 1500–7800)
Neutrophils Relative %: 54.1 %
Platelets: 230 10*3/uL (ref 140–400)
RBC: 4.99 10*6/uL (ref 3.80–5.10)
RDW: 12.6 % (ref 11.0–15.0)
Total Lymphocyte: 32.5 %
WBC: 7.3 10*3/uL (ref 3.8–10.8)

## 2020-12-07 LAB — LIPID PANEL
Cholesterol: 171 mg/dL (ref <200)
HDL: 42 mg/dL — ABNORMAL LOW (ref >=50)
LDL Cholesterol (Calc): 111 mg/dL (calc) — ABNORMAL HIGH
Non-HDL Cholesterol (Calc): 129 mg/dL (calc) (ref <130)
Total CHOL/HDL Ratio: 4.1 (calc) (ref <5.0)
Triglycerides: 87 mg/dL (ref <150)

## 2020-12-07 LAB — COMPLETE METABOLIC PANEL WITH GFR
AG Ratio: 1.9 (calc) (ref 1.0–2.5)
ALT: 18 U/L (ref 6–29)
AST: 8 U/L — ABNORMAL LOW (ref 10–30)
Albumin: 4.4 g/dL (ref 3.6–5.1)
Alkaline phosphatase (APISO): 57 U/L (ref 31–125)
BUN/Creatinine Ratio: 9 (calc) (ref 6–22)
BUN: 11 mg/dL (ref 7–25)
CO2: 28 mmol/L (ref 20–32)
Calcium: 9.7 mg/dL (ref 8.6–10.2)
Chloride: 103 mmol/L (ref 98–110)
Creat: 1.23 mg/dL — ABNORMAL HIGH (ref 0.50–0.99)
Globulin: 2.3 g/dL (calc) (ref 1.9–3.7)
Glucose, Bld: 90 mg/dL (ref 65–99)
Potassium: 4.1 mmol/L (ref 3.5–5.3)
Sodium: 139 mmol/L (ref 135–146)
Total Bilirubin: 0.4 mg/dL (ref 0.2–1.2)
Total Protein: 6.7 g/dL (ref 6.1–8.1)
eGFR: 57 mL/min/{1.73_m2} — ABNORMAL LOW (ref >=60)

## 2020-12-07 LAB — HEMOGLOBIN A1C
Hgb A1c MFr Bld: 5.3 % of total Hgb (ref <5.7)
Mean Plasma Glucose: 105 mg/dL
eAG (mmol/L): 5.8 mmol/L

## 2020-12-28 ENCOUNTER — Other Ambulatory Visit: Payer: Self-pay | Admitting: Family Medicine

## 2020-12-28 ENCOUNTER — Encounter: Payer: Self-pay | Admitting: Family Medicine

## 2020-12-28 MED ORDER — AMLODIPINE BESYLATE 2.5 MG PO TABS
2.5000 mg | ORAL_TABLET | Freq: Every day | ORAL | 0 refills | Status: DC
Start: 2020-12-28 — End: 2021-03-01

## 2021-01-18 ENCOUNTER — Ambulatory Visit: Payer: Self-pay | Admitting: Family Medicine

## 2021-02-08 ENCOUNTER — Other Ambulatory Visit: Payer: Self-pay | Admitting: Family Medicine

## 2021-02-08 DIAGNOSIS — J4530 Mild persistent asthma, uncomplicated: Secondary | ICD-10-CM

## 2021-02-14 ENCOUNTER — Telehealth: Payer: Self-pay | Admitting: Family Medicine

## 2021-02-14 ENCOUNTER — Telehealth: Payer: Self-pay

## 2021-02-14 DIAGNOSIS — J4530 Mild persistent asthma, uncomplicated: Secondary | ICD-10-CM

## 2021-02-14 NOTE — Telephone Encounter (Signed)
Submitted PA through cover my meds for patient Linzess 257mcg. Waiting on response from insurance company

## 2021-02-14 NOTE — Telephone Encounter (Signed)
Medication Refill - Medication: albuterol (VENTOLIN HFA) 108 (90 Base) MCG/ACT inhaler  Has the patient contacted their pharmacy? No. This was sent to a different pharmacy Preferred Pharmacy (with phone number or street name): [facility] [place] [facility] [place]  Has the patient been seen for an appointment in the last year OR does the patient have an upcoming appointment? Yes.    Agent: Please be advised that RX refills may take up to 3 business days. We ask that you follow-up with your pharmacy.

## 2021-02-14 NOTE — Telephone Encounter (Signed)
Requested medication (s) are due for refill today: yes  Requested medication (s) are on the active medication list: yes  Last refill:  11/28/20 18g with 0 RF  Future visit scheduled: no, canceled 01/18/21  Notes to clinic:  was asked to make appt and has not, was seen 3 months ago, please assess.   Requested Prescriptions  Pending Prescriptions Disp Refills   albuterol (VENTOLIN HFA) 108 (90 Base) MCG/ACT inhaler 18 g 0     Pulmonology:  Beta Agonists 2 Passed - 02/14/2021  2:07 PM      Passed - Last BP in normal range    BP Readings from Last 1 Encounters:  12/06/20 132/80          Passed - Last Heart Rate in normal range    Pulse Readings from Last 1 Encounters:  12/06/20 93          Passed - Valid encounter within last 12 months    Recent Outpatient Visits           2 months ago Morbid obesity with BMI of 50.0-59.9, adult [facility])   [facility] Steele Sizer, MD   6 months ago Stage 3a chronic [facility])   [facility] Steele Sizer, MD   7 months ago Erroneous encounter - disregard   [facility] Steele Sizer, MD   8 months ago SOB (shortness of breath)   [facility] Steele Sizer, MD   8 months ago SOB (shortness of breath)   [facility] Steele Sizer, MD

## 2021-02-27 ENCOUNTER — Telehealth: Payer: Self-pay | Admitting: Family Medicine

## 2021-02-27 DIAGNOSIS — G8929 Other chronic pain: Secondary | ICD-10-CM

## 2021-02-28 ENCOUNTER — Other Ambulatory Visit: Payer: Self-pay

## 2021-02-28 DIAGNOSIS — J4530 Mild persistent asthma, uncomplicated: Secondary | ICD-10-CM

## 2021-02-28 NOTE — Telephone Encounter (Signed)
Pt said that she just seen you on 12-06-2020 and that she was out due to family member in the hosp and she can not keep missing work. Wants to know if she can do a virtual visit for the inhaler request? PLease advise

## 2021-02-28 NOTE — Progress Notes (Signed)
Name: Barbara Pennington   MRN: [id number]    DOB: 1980/02/15   Date:03/01/2021       Progress Note  Subjective  Chief Complaint  Medication Refill  I connected with  Barbara Pennington  on 03/01/21 at 10:40 AM EST by a video enabled telemedicine application and verified that I am speaking with the correct person using two identifiers.  I discussed the limitations of evaluation and management by telemedicine and the availability of in person appointments. The patient expressed understanding and agreed to proceed with the virtual visit  Staff also discussed with the patient that there may be a patient responsible charge related to this service. Patient Location: at work  Provider Location: [facility] Additional Individuals present: alone   HPI  Asthma mild persistent: she states sometimes she has difficulty catching her breath when anxious, but also has worsening of symptoms when physically active. Especially at the end of activity . She also has a cough during activity. She has been using Advair prn  but has been out rescue inhaler . Discussed adding singulair   HTN/ Chronic kidney disease: States her BP at home is still around  166/106. She states she is following up with Nephrology for monitoring. She does not like taking multiple medications per day, we will change from Losartan 25 and HCTZ 25 and also Norvasc 2.5 mg to Exforge HCTZ 5/160/12.5 mg to increase compliance, but advised to take 100 mg of losartan and hctz 25 until the weekend when she can switch to the new rx. Discussed possible side effects including orthostatic changes .   Borderline Personality disorder and MDD: she needs to go back to psychiatrist, she goes to [facility]  OSA: she is wearing CPAP, modafinil did not improve cognition or decrease fatigue and she stopped taking it.   Morbid Obesity/ Metabolic Syndrome: Discussed efforts towards increased exercise. Patient states she is trying to walk more during the day but has not been going to  the gym like previously discussed. States she has tried Metformin in the past to assist with metabolic syndrome but this was not beneficial for her, she was given Rybelsus 3 mg last visit, she was not able to get 7 mg dose due to insurance problems.   Chronic back pain: Patient requests refill of Tizanidine for back pain but taking two 2 mg at night , she states continues to have back pain, trying to use her stand up desk but pain is constant now. It was initially a workmans comp case. She is 5 % disabled. Discussed Tylenol and consider chiropractor .   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   Essential hypertension     Past Surgical History:  Procedure Laterality Date   ABDOMINAL HYSTERECTOMY  2008   BREAST BIOPSY Left 2013   CORE - FIBROADENOMA VS PHYLLODES   CESAREAN SECTION  2007   COLONOSCOPY WITH PROPOFOL N/A 10/09/2016   Procedure: COLONOSCOPY WITH PROPOFOL;  Surgeon:  Jonathon Bellows, MD;  Location: [facility] ENDOSCOPY;  Service: Gastroenterology;  Laterality: N/A;   COLONOSCOPY WITH PROPOFOL N/A 11/09/2019   Procedure: COLONOSCOPY WITH PROPOFOL;  Surgeon: Lin Landsman, MD;  Location: [facility] ENDOSCOPY;  Service: Gastroenterology;  Laterality: N/A;   GUM SURGERY  2004   PLANTAR FASCIA RELEASE Left 08/19/2014   Procedure: ENDOSCOPIC PLANTAR FASCIOTOMY RELEASE L WITH TOPAZ;  Surgeon: Albertine Patricia, DPM;  Location: [place];   Service: Podiatry;  Laterality: Left;  LMA WITH POPLITEAL BLOCK   TONSILLECTOMY  1984    Family History  Problem Relation Age of Onset   Testicular cancer Brother    Colon cancer Paternal Grandfather    Breast cancer Maternal Grandmother    Hypertension Maternal Grandmother    Aneurysm Maternal Grandmother    Stroke Maternal Grandmother    Obesity Mother    Diabetes Mother    High Cholesterol Mother    Hypertension Mother    Neuropathy Mother    Depression Mother    Anxiety disorder Mother    Obesity Father    Hypertension Father    High Cholesterol Father    Depression Father    Anxiety disorder Father    Hypertension Maternal Grandfather    High Cholesterol Maternal Grandfather    Heart disease Maternal Grandfather        has a pacemaker   Stroke Maternal Grandfather    Alzheimer's disease Paternal Grandmother    Dementia Paternal Grandmother     Social History   Socioeconomic History   Marital status: Divorced    Spouse name: Not on file   Number of children: 2   Years of education: 14   Highest education level: Bachelor's degree (e.g., BA, AB, BS)  Occupational History   Not on file  Tobacco Use   Smoking status: Former    Packs/day: 0.25    Years: 10.00    Pack years: 2.50    Types: Cigarettes    Quit date: 07/05/2014    Years since quitting: 6.6   Smokeless tobacco: Never  Vaping Use   Vaping Use: Never used  Substance and Sexual Activity   Alcohol use: Yes    Comment: rare   Drug use: No   Sexual activity: Not Currently    Partners: Male    Birth control/protection: Surgical    Comment: 2007  Other Topics Concern   Not on file  Social History Narrative   Not on file   Social Determinants of Health   Financial Resource Strain: High Risk   Difficulty of Paying Living Expenses: Hard  Food Insecurity: No Food Insecurity   Worried About Running Out of Food in the Last Year: Never true   Ran Out of Food in the Last Year: Never true   Transportation Needs: No Transportation Needs   Lack of Transportation (Medical): No   Lack of Transportation (Non-Medical): No  Physical Activity: Unknown   Days of Exercise per Week: 0 days   Minutes of Exercise per Session: Not on file  Stress: Stress Concern Present   Feeling of Stress : To some extent  Social Connections: Socially Isolated   Frequency of Communication with Friends and Family: More than three times a week   Frequency of Social Gatherings with Friends and Family: Never   Attends Religious Services: Never   Marine scientist or Organizations: No   Attends Music therapist: Not on file   Marital Status: Divorced  Intimate Partner Violence: Not on  file     Current Outpatient Medications:    acetaminophen (TYLENOL) 325 MG tablet, Take 650 mg by mouth every 6 (six) hours as needed. , Disp: , Rfl:    albuterol (VENTOLIN HFA) 108 (90 Base) MCG/ACT inhaler, INHALE 2 PUFFS BY MOUTH EVERY 6 HOURS AS NEEDED FOR WHEEZING AND FOR SHORTNESS OF BREATH, Disp: 18 g, Rfl: 0   amLODipine (NORVASC) 2.5 MG tablet, Take 1 tablet (2.5 mg total) by mouth daily., Disp: 30 tablet, Rfl: 0   atorvastatin (LIPITOR) 20 MG tablet, Take 1 tablet (20 mg total) by mouth daily., Disp: 90 tablet, Rfl: 0   buPROPion (WELLBUTRIN XL) 300 MG 24 hr tablet, Take 300 mg by mouth every morning., Disp: , Rfl:    cholecalciferol (VITAMIN D3) 25 MCG (1000 UNIT) tablet, Take 1,000 Units by mouth daily., Disp: , Rfl:    escitalopram (LEXAPRO) 20 MG tablet, Take 1 tablet (20 mg total) by mouth daily., Disp: 30 tablet, Rfl: 0   Fluticasone-Salmeterol (ADVAIR DISKUS) 100-50 MCG/DOSE AEPB, Inhale 1 puff into the lungs in the morning and at bedtime., Disp: 60 each, Rfl: 3   hydrochlorothiazide (HYDRODIURIL) 25 MG tablet, Take 1 tablet (25 mg total) by mouth daily., Disp: 90 tablet, Rfl: 1   hydrOXYzine (ATARAX/VISTARIL) 50 MG tablet, Take 1 tablet (50 mg total) by mouth 2 (two) times daily., Disp: 60  tablet, Rfl: 5   losartan (COZAAR) 25 MG tablet, Take 1 tablet (25 mg total) by mouth daily., Disp: 90 tablet, Rfl: 0   modafinil (PROVIGIL) 100 MG tablet, Take 1 tablet (100 mg total) by mouth daily., Disp: 90 tablet, Rfl: 0   Semaglutide (RYBELSUS) 7 MG TABS, Take 7 mg by mouth daily., Disp: 90 tablet, Rfl: 0   tiZANidine (ZANAFLEX) 2 MG tablet, Take 1-2 tablets (2-4 mg total) by mouth at bedtime., Disp: 100 tablet, Rfl: 1   linaclotide (LINZESS) 290 MCG CAPS capsule, Take 1 capsule (290 mcg total) by mouth daily before breakfast., Disp: 90 capsule, Rfl: 1  Allergies  Allergen Reactions   Latex Rash    I personally reviewed active problem list, medication list, allergies, family history, social history, health maintenance with the patient/caregiver today.   ROS  Ten systems reviewed and is negative except as mentioned in HPI   Objective  Virtual encounter, vitals not obtained.  There is no height or weight on file to calculate BMI.  Physical Exam  Awake, alert and oriented   PHQ2/9: Depression screen [facility] 2/9 03/01/2021 12/06/2020 08/09/2020 06/21/2020 06/08/2020  Decreased Interest 0 0 0 0 0  Down, Depressed, Hopeless 0 0 0 0 0  PHQ - 2 Score 0 0 0 0 0  Altered sleeping 0 3 0 1 3  Tired, decreased energy 0 3 1 1 3   Change in appetite 0 0 0 1 1  Feeling bad or failure about yourself  0 0 0 0 0  Trouble concentrating 0 1 1 2 2   Moving slowly or fidgety/restless 0 0 0 1 1  Suicidal thoughts 0 0 0 0 0  PHQ-9 Score 0 7 2 6 10   Difficult doing work/chores - - - Somewhat difficult Somewhat difficult  Some recent data might be hidden   PHQ-2/9 Result is negative.    Fall Risk: Fall Risk  03/01/2021 12/06/2020 08/09/2020 06/21/2020 06/08/2020  Falls in the past year? 0 1 0 1 1  Number falls in past yr: 0 1 0 1 1  Injury with Fall? 0 0 0 0 0  Risk for fall due to : No Fall Risks No Fall Risks - History of fall(s) No Fall Risks  Follow up Falls prevention discussed Falls prevention  discussed - Falls prevention discussed Falls evaluation completed     Assessment & Plan   1. Mild persistent asthma without complication  - albuterol (VENTOLIN HFA) 108 (90 Base) MCG/ACT inhaler; INHALE 2 PUFFS BY MOUTH EVERY 6 HOURS AS NEEDED FOR WHEEZING AND FOR SHORTNESS OF BREATH  Dispense: 18 g; Refill: 0 - montelukast (SINGULAIR) 10 MG tablet; Take 1 tablet (10 mg total) by mouth at bedtime.  Dispense: 90 tablet; Refill: 1  2. Essential hypertension  - amLODIPine-Valsartan-HCTZ 5-160-12.5 MG TABS; Take 1 tablet by mouth daily at 12 noon.  Dispense: 90 tablet; Refill: 0  3. OSA on CPAP   4. Impaired fasting glucose  - Semaglutide,0.25 or 0.5MG /DOS, (OZEMPIC, 0.25 OR 0.5 MG/DOSE,) 2 MG/1.5ML SOPN; Inject 0.25-0.5 mg into the skin once a week.  Dispense: 4.5 mL; Refill: 0  5. Moderate episode of recurrent major depressive disorder ([place])  - Ambulatory referral to Psychiatry  6. Mixed hyperlipidemia  - atorvastatin (LIPITOR) 20 MG tablet; Take 1 tablet (20 mg total) by mouth daily.  Dispense: 90 tablet; Refill: 1  7. Stage 3a chronic kidney disease ([place])   8. Borderline personality disorder ([place])  - Ambulatory referral to Psychiatry  9. Chronic bilateral low back pain without sciatica  - tiZANidine (ZANAFLEX) 4 MG tablet; Take 1 tablet (4 mg total) by mouth at bedtime.  Dispense: 90 tablet; Refill: 0  10. Morbid obesity with BMI of 50.0-59.9, adult ([place])  - Semaglutide,0.25 or 0.5MG /DOS, (OZEMPIC, 0.25 OR 0.5 MG/DOSE,) 2 MG/1.5ML SOPN; Inject 0.25-0.5 mg into the skin once a week.  Dispense: 4.5 mL; Refill: 0  I discussed the assessment and treatment plan with the patient. The patient was provided an opportunity to ask questions and all were answered. The patient agreed with the plan and demonstrated an understanding of the instructions.  The patient was advised to call back or seek an in-person evaluation if the symptoms worsen or if the condition fails to improve as  anticipated.  I provided 25  minutes of non-face-to-face time during this encounter.

## 2021-02-28 NOTE — Telephone Encounter (Signed)
Requested medication (s) are due for refill today: No, filled for 100 14 days ago  Requested medication (s) are on the active medication list: yes  Last refill:  12/06/20 #100 with 1 RF  Future visit scheduled: canceled 01/18/21, no appt scheduled  Notes to clinic:  This medication can not be delegated, please assess.      Requested Prescriptions  Pending Prescriptions Disp Refills   tiZANidine (ZANAFLEX) 2 MG tablet [Pharmacy Med Name: tiZANidine HCl 2 MG Oral Tablet] 100 tablet 0    Sig: TAKE 1 TO 2 TABLETS BY MOUTH AT BEDTIME     Not Delegated - Cardiovascular:  Alpha-2 Agonists - tizanidine Failed - 02/28/2021 11:21 AM      Failed - This refill cannot be delegated      Passed - Valid encounter within last 6 months    Recent Outpatient Visits           2 months ago Morbid obesity with BMI of 50.0-59.9, adult [facility])   [facility] Steele Sizer, MD   6 months ago Stage 3a chronic kidney disease [facility])   [facility] Steele Sizer, MD   8 months ago Erroneous encounter - disregard   [facility] Steele Sizer, MD   8 months ago SOB (shortness of breath)   [facility] Steele Sizer, MD   9 months ago SOB (shortness of breath)   [facility] Steele Sizer, MD

## 2021-02-28 NOTE — Telephone Encounter (Signed)
Pt called in to follow up on request for tiZANidine (ZANAFLEX) 2 MG tablet. Pt says that she take this medication at night. Pt says that she is in pain during the day. Pt would like to know what can she take to help her during the day?     [phone] (cell)    Pharmacy:  [facility] [address] ([address]), [place] - [place] Phone:  [phone]  Fax:  [phone]      Previous appt: 12/06/20  Future appt: none scheduled

## 2021-02-28 NOTE — Telephone Encounter (Signed)
Pt called in to follow up on Rx for Albuterol. Pt says that she lost her rescue inhaler and would like to have a Rx just to have. Pt says that she has been exercising more and would like to make sure to have it if she needs it.    Pharmacy:  [place] ([place]), [place] - [address] Phone:  [phone]  Fax:  [phone]

## 2021-03-01 ENCOUNTER — Encounter: Payer: Self-pay | Admitting: Family Medicine

## 2021-03-01 ENCOUNTER — Telehealth (INDEPENDENT_AMBULATORY_CARE_PROVIDER_SITE_OTHER): Payer: Self-pay | Admitting: Family Medicine

## 2021-03-01 DIAGNOSIS — M545 Low back pain, unspecified: Secondary | ICD-10-CM

## 2021-03-01 DIAGNOSIS — F603 Borderline personality disorder: Secondary | ICD-10-CM

## 2021-03-01 DIAGNOSIS — R7301 Impaired fasting glucose: Secondary | ICD-10-CM

## 2021-03-01 DIAGNOSIS — Z6841 Body Mass Index (BMI) 40.0 and over, adult: Secondary | ICD-10-CM

## 2021-03-01 DIAGNOSIS — Z9989 Dependence on other enabling machines and devices: Secondary | ICD-10-CM

## 2021-03-01 DIAGNOSIS — I10 Essential (primary) hypertension: Secondary | ICD-10-CM

## 2021-03-01 DIAGNOSIS — J4530 Mild persistent asthma, uncomplicated: Secondary | ICD-10-CM

## 2021-03-01 DIAGNOSIS — F331 Major depressive disorder, recurrent, moderate: Secondary | ICD-10-CM

## 2021-03-01 DIAGNOSIS — G8929 Other chronic pain: Secondary | ICD-10-CM

## 2021-03-01 DIAGNOSIS — G4733 Obstructive sleep apnea (adult) (pediatric): Secondary | ICD-10-CM

## 2021-03-01 DIAGNOSIS — N1831 Chronic kidney disease, stage 3a: Secondary | ICD-10-CM

## 2021-03-01 DIAGNOSIS — E782 Mixed hyperlipidemia: Secondary | ICD-10-CM

## 2021-03-01 MED ORDER — ALBUTEROL SULFATE HFA 108 (90 BASE) MCG/ACT IN AERS: INHALATION_SPRAY | RESPIRATORY_TRACT | 0 refills | Status: DC

## 2021-03-01 MED ORDER — OZEMPIC (0.25 OR 0.5 MG/DOSE) 2 MG/1.5ML ~~LOC~~ SOPN: 0.2500 mg | PEN_INJECTOR | SUBCUTANEOUS | 0 refills | Status: DC

## 2021-03-01 MED ORDER — MONTELUKAST SODIUM 10 MG PO TABS: 10.0000 mg | ORAL_TABLET | Freq: Every day | ORAL | 1 refills | Status: DC

## 2021-03-01 MED ORDER — ATORVASTATIN CALCIUM 20 MG PO TABS: 20.0000 mg | ORAL_TABLET | Freq: Every day | ORAL | 1 refills | Status: DC

## 2021-03-01 MED ORDER — AMLODIPINE-VALSARTAN-HCTZ 5-160-12.5 MG PO TABS: 1.0000 | ORAL_TABLET | Freq: Every day | ORAL | 0 refills | Status: DC

## 2021-03-01 MED ORDER — TIZANIDINE HCL 4 MG PO TABS: 4.0000 mg | ORAL_TABLET | Freq: Every day | ORAL | 0 refills | Status: DC

## 2021-03-01 NOTE — Patient Instructions (Signed)
Take a total of 100 mg of losartan and hctz 25 mg for the rest of this week, on Saturday or Sunday you can switch to new bp medication ( take in them morning)

## 2021-03-02 ENCOUNTER — Telehealth: Payer: Self-pay | Admitting: Family Medicine

## 2021-03-02 NOTE — Telephone Encounter (Signed)
Pt is calling to ask if Dr. Ancil Boozer can respond to [place] for clarification of Semaglutide,0.25 or 0.5MG /DOS, (OZEMPIC, 0.25 OR 0.5 MG/DOSE,) 2 MG/1.5ML [facility] [[id number]]  & amLODIPine-Valsartan-HCTZ 5-160-12.5 MG TABS [[id number]]   [place]

## 2021-03-03 ENCOUNTER — Other Ambulatory Visit: Payer: Self-pay

## 2021-03-03 DIAGNOSIS — R7301 Impaired fasting glucose: Secondary | ICD-10-CM

## 2021-03-03 DIAGNOSIS — Z6841 Body Mass Index (BMI) 40.0 and over, adult: Secondary | ICD-10-CM

## 2021-03-07 NOTE — Telephone Encounter (Signed)
Spoke with pharmacy and patient. Insurance PA's initiated for [employer] and amlodipine/hctz-valsartan awaiting response. Too early for patient to pick up atorvastatin (05/04/2021) Will update when insurance responses are received.

## 2021-03-07 NOTE — Telephone Encounter (Unsigned)
Copied from [place] [id number]. Topic: General - Other >> Mar 07, 2021 11:18 AM Tessa Lerner A wrote: Reason for CRM: The patient has called for an update on prescriptions that required prior authorization   The patient shares that one of the medications needing prior authorization was ozempic but they are uncertain of the other medication  Please contact further to discuss

## 2021-03-08 ENCOUNTER — Telehealth: Payer: Self-pay

## 2021-03-08 ENCOUNTER — Encounter: Payer: Self-pay | Admitting: Family Medicine

## 2021-03-08 NOTE — Telephone Encounter (Signed)
Left message for patient to call back with new insurance information in order to process her Pas for Ozempic and HCTZ combo prescribed at last visit. Patient no longer has Medicaid or [employer] listed in her chart. She stated she has the [employer] but has not brought in the card to be scanned yet. I just need the member ID in order to process on my end at this time.  ?

## 2021-03-08 NOTE — Telephone Encounter (Signed)
Copied from [address] [phone]. Topic: General - Other >> Mar 08, 2021 11:05 AM Yvette Rack wrote: Reason for CRM: Pt stated she needs the Rx for amLODIPine-Valsartan-HCTZ 5-160-12.5 MG TABS to be written as 3 separate prescriptions because she can not afford to pay over $200 to have it filled.

## 2021-03-09 ENCOUNTER — Ambulatory Visit: Payer: Self-pay

## 2021-03-09 NOTE — Telephone Encounter (Signed)
?  Chief Complaint: knee injury ?Symptoms: LLE and knee pain, swelling to L knee ?Frequency: since last night  ?Pertinent Negatives: NA ?Disposition: [] ED /[] Urgent Care (no appt availability in office) / [x] Appointment(In office/virtual)/ []  [facility]/ [] Home Care/ [] Refused Recommended Disposition /[] [facility]/ []  Follow-up with PCP ?Additional Notes: pt states she has been using ice and elevation for swelling.  ? ? ?Reason for Disposition ? Large swelling or bruise > 2 inches (5 cm) ? ?Answer Assessment - Initial Assessment Questions ?1. MECHANISM: "How did the injury happen?" (e.g., twisting injury, direct blow)  ?    Fall getting into shower  ?2. ONSET: "When did the injury happen?" (Minutes or hours ago)  ?    Yesterday  ?3. LOCATION: "Where is the injury located?"  ?    LLE and knee ?4. APPEARANCE of INJURY: "What does the injury look like?"  (e.g., deformity of leg) ?    Swelling around knee  ?5. SEVERITY: "Can you put weight on that leg?" "Can you walk?"  ?    yes ?6. SIZE: For cuts, bruises, or swelling, ask: "How large is it?" (e.g., inches or centimeters)  ?    No ?7. PAIN: "Is there pain?" If Yes, ask: "How bad is the pain?"   "What does it keep you from doing?" (e.g., Scale 1-10; or mild, moderate, severe) ?  -  NONE: (0): no pain ?  -  MILD (1-3): doesn't interfere with normal activities  ?  -  MODERATE (4-7): interferes with normal activities (e.g., work or school) or awakens from sleep, limping  ?  -  SEVERE (8-10): excruciating pain, unable to do any normal activities, unable to walk ?    4 ?9. OTHER SYMPTOMS: "Do you have any other symptoms?"  ?    No ? ?Protocols used: Leg Injury-A-AH ? ?

## 2021-03-10 ENCOUNTER — Ambulatory Visit (INDEPENDENT_AMBULATORY_CARE_PROVIDER_SITE_OTHER): Payer: PRIVATE HEALTH INSURANCE | Admitting: Internal Medicine

## 2021-03-10 ENCOUNTER — Ambulatory Visit
Admission: RE | Admit: 2021-03-10 | Discharge: 2021-03-10 | Disposition: A | Discharge status: Home / Self Care | Payer: 59 | Attending: Internal Medicine | Admitting: Internal Medicine

## 2021-03-10 ENCOUNTER — Encounter: Payer: Self-pay | Admitting: Internal Medicine

## 2021-03-10 ENCOUNTER — Other Ambulatory Visit: Payer: Self-pay

## 2021-03-10 ENCOUNTER — Ambulatory Visit
Admission: RE | Admit: 2021-03-10 | Discharge: 2021-03-10 | Disposition: A | Discharge status: Home / Self Care | Payer: 59 | Source: Ambulatory Visit | Attending: Internal Medicine | Admitting: Internal Medicine

## 2021-03-10 VITALS — BP 122/86 | HR 93 | Temp 98.1°F | Resp 16 | Ht 64.0 in | Wt 334.7 lb

## 2021-03-10 DIAGNOSIS — M25562 Pain in left knee: Secondary | ICD-10-CM | POA: Diagnosis not present

## 2021-03-10 MED ORDER — NAPROXEN 500 MG PO TABS: 500.0000 mg | ORAL_TABLET | Freq: Two times a day (BID) | ORAL | 0 refills | Status: AC

## 2021-03-10 NOTE — Progress Notes (Signed)
° °Acute Office Visit ° °Subjective:  ° ° Patient ID: Barbara Pennington, female    DOB: 02/13/1980, 40 y.o.   MRN: [id number] ° °Chief Complaint  °Patient presents with  ° Fall  ° Knee Pain  °  left  ° ° °HPI °Patient is in today for knee pain after a fall. Fell while getting into the tub 2 days ago, heard a pop. Hx of injury in the same knee before, sprain at that time, no previous surgeries. Able to bear weight, hard time with flexion. Taking Ibuprofen. Medial and lateral pain.  ° °KNEE PAIN °Duration:  2 days °Involved knee: left °Mechanism of injury:  Fell getting into bath tub °Location: medial and lateral  °Onset: sudden °Severity: mild  °Quality:  aching °Frequency: intermittent °Radiation: no °Aggravating factors: walking  °Alleviating factors: ice, rest  °Status: stable °Treatments attempted: rest, ice, Ibuprofen °Relief with NSAIDs?:  mild °Weakness with weight bearing or walking: no °Sensation of giving way: no °Locking: no °Popping: yes °Bruising: no °Swelling: no °Redness: no ° °Past Medical History:  °Diagnosis Date  ° Anxiety   ° Arthritis 2006  ° rhuematoid - mild - no current issues  ° Asthma 2003  ° Complication of anesthesia   ° ran fever after c-section - had infection.  "Usually" has low temp after surgery.  ° Hypertension 2013  ° Lump or mass in breast 2013  ° RIGHT BREAST  ° Motion sickness   ° repeated amusement park rides  ° Scoliosis   ° no current issues  ° Shortness of breath dyspnea   ° secondary to weight   ° Ulcer 2001  ° ° °Past Surgical History:  °Procedure Laterality Date  ° ABDOMINAL HYSTERECTOMY  2008  ° BREAST BIOPSY Left 2013  ° CORE - FIBROADENOMA VS PHYLLODES  ° CESAREAN SECTION  2007  ° COLONOSCOPY WITH PROPOFOL N/A 10/09/2016  ° Procedure: COLONOSCOPY WITH PROPOFOL;  Surgeon: Anna, Kiran, MD;  Location: [facility];  Service: Gastroenterology;  Laterality: N/A;  ° COLONOSCOPY WITH PROPOFOL N/A 11/09/2019  ° Procedure: COLONOSCOPY WITH PROPOFOL;  Surgeon: Vanga, Rohini Reddy, MD;   Location: [facility];  Service: Gastroenterology;  Laterality: N/A;  ° GUM SURGERY  2004  ° PLANTAR FASCIA RELEASE Left 08/19/2014  ° Procedure: ENDOSCOPIC PLANTAR FASCIOTOMY RELEASE L WITH TOPAZ;  Surgeon: Matthew Troxler, DPM;  Location: [facility];  Service: Podiatry;  Laterality: Left;  LMA WITH POPLITEAL BLOCK  ° TONSILLECTOMY  1984  ° ° °Family History  °Problem Relation Age of Onset  ° Testicular cancer Brother   ° Colon cancer Paternal Grandfather   ° Breast cancer Maternal Grandmother   ° Hypertension Maternal Grandmother   ° Aneurysm Maternal Grandmother   ° Stroke Maternal Grandmother   ° Obesity Mother   ° Diabetes Mother   ° High Cholesterol Mother   ° Hypertension Mother   ° Neuropathy Mother   ° Depression Mother   ° Anxiety disorder Mother   ° Obesity Father   ° Hypertension Father   ° High Cholesterol Father   ° Depression Father   ° Anxiety disorder Father   ° Hypertension Maternal Grandfather   ° High Cholesterol Maternal Grandfather   ° Heart disease Maternal Grandfather   °     has a pacemaker  ° Stroke Maternal Grandfather   ° Alzheimer's disease Paternal Grandmother   ° Dementia Paternal Grandmother   ° ° °Social History  ° °Socioeconomic History  ° Marital status: Divorced  °    Spouse name: Not on file   Number of children: 2   Years of education: 14   Highest education level: Bachelor's degree (e.g., BA, AB, BS)  Occupational History   Not on file  Tobacco Use   Smoking status: Former    Packs/day: 0.25    Years: 10.00    Pack years: 2.50    Types: Cigarettes    Quit date: 07/05/2014    Years since quitting: 6.6   Smokeless tobacco: Never  Vaping Use   Vaping Use: Never used  Substance and Sexual Activity   Alcohol use: Yes    Comment: rare   Drug use: No   Sexual activity: Not Currently    Partners: Male    Birth control/protection: Surgical    Comment: 2007  Other Topics Concern   Not on file  Social History Narrative   Not on file   Social  Determinants of Health   Financial Resource Strain: High Risk   Difficulty of Paying Living Expenses: Hard  Food Insecurity: No Food Insecurity   Worried About Running Out of Food in the Last Year: Never true   Ran Out of Food in the Last Year: Never true  Transportation Needs: No Transportation Needs   Lack of Transportation (Medical): No   Lack of Transportation (Non-Medical): No  Physical Activity: Unknown   Days of Exercise per Week: 0 days   Minutes of Exercise per Session: Not on file  Stress: Stress Concern Present   Feeling of Stress : To some extent  Social Connections: Socially Isolated   Frequency of Communication with Friends and Family: More than three times a week   Frequency of Social Gatherings with Friends and Family: Never   Attends Religious Services: Never   Marine scientist or Organizations: No   Attends Music therapist: Not on file   Marital Status: Divorced  Human resources officer Violence: Not on file    Outpatient Medications Prior to Visit  Medication Sig Dispense Refill   acetaminophen (TYLENOL) 325 MG tablet Take 650 mg by mouth every 6 (six) hours as needed.      albuterol (VENTOLIN HFA) 108 (90 Base) MCG/ACT inhaler INHALE 2 PUFFS BY MOUTH EVERY 6 HOURS AS NEEDED FOR WHEEZING AND FOR SHORTNESS OF BREATH 18 g 0   amLODIPine-Valsartan-HCTZ 5-160-12.5 MG TABS Take 1 tablet by mouth daily at 12 noon. 90 tablet 0   atorvastatin (LIPITOR) 20 MG tablet Take 1 tablet (20 mg total) by mouth daily. 90 tablet 1   buPROPion (WELLBUTRIN XL) 300 MG 24 hr tablet Take 300 mg by mouth every morning.     cholecalciferol (VITAMIN D3) 25 MCG (1000 UNIT) tablet Take 1,000 Units by mouth daily.     escitalopram (LEXAPRO) 20 MG tablet Take 1 tablet (20 mg total) by mouth daily. 30 tablet 0   Fluticasone-Salmeterol (ADVAIR DISKUS) 100-50 MCG/DOSE AEPB Inhale 1 puff into the lungs in the morning and at bedtime. 60 each 3   hydrOXYzine (ATARAX/VISTARIL) 50 MG  tablet Take 1 tablet (50 mg total) by mouth 2 (two) times daily. 60 tablet 5   linaclotide (LINZESS) 290 MCG CAPS capsule Take 1 capsule (290 mcg total) by mouth daily before breakfast. 90 capsule 1   montelukast (SINGULAIR) 10 MG tablet Take 1 tablet (10 mg total) by mouth at bedtime. 90 tablet 1   Semaglutide,0.25 or 0.5MG/DOS, (OZEMPIC, 0.25 OR 0.5 MG/DOSE,) 2 MG/1.5ML SOPN Inject 0.25-0.5 mg into the skin once a week. 4.5 mL  0  ° tiZANidine (ZANAFLEX) 4 MG tablet Take 1 tablet (4 mg total) by mouth at bedtime. 90 tablet 0  ° °No facility-administered medications prior to visit.  ° ° °Allergies  °Allergen Reactions  ° Latex Rash  ° ° °Review of Systems  °Constitutional:  Negative for chills and fever.  °Musculoskeletal:  Negative for gait problem and joint swelling.  °Skin: Negative.   °Neurological:  Negative for weakness and numbness.  ° °   °Objective:  °  °Physical Exam °Constitutional:   °   Appearance: Normal appearance. She is obese.  °HENT:  °   Head: Normocephalic and atraumatic.  °Eyes:  °   Conjunctiva/sclera: Conjunctivae normal.  °Cardiovascular:  °   Rate and Rhythm: Normal rate and regular rhythm.  °Pulmonary:  °   Effort: Pulmonary effort is normal.  °   Breath sounds: Normal breath sounds.  °Musculoskeletal:     °   General: Tenderness present. No swelling. Normal range of motion.  °   Right knee: Normal.  °   Left knee: Crepitus present. No swelling, deformity, effusion or erythema. Normal range of motion. Tenderness present over the medial joint line. No LCL laxity, MCL laxity, ACL laxity or PCL laxity.Normal alignment, normal meniscus and normal patellar mobility.  °   Instability Tests: Anterior drawer test negative. Posterior drawer test negative. Anterior Lachman test negative. Medial McMurray test negative and lateral McMurray test negative.  °   Right lower leg: No edema.  °   Left lower leg: No edema.  °Skin: °   General: Skin is warm and dry.  °Neurological:  °   General: No focal  deficit present.  °   Mental Status: She is alert. Mental status is at baseline.  °Psychiatric:     °   Mood and Affect: Mood normal.     °   Behavior: Behavior normal.  ° ° °BP 122/86    Pulse 93    Temp 98.1 °F (36.7 °C)    Resp 16    Ht 5' 4" (1.626 m)    Wt (!) 334 lb 11.2 oz (151.8 kg)    SpO2 96%    BMI 57.45 kg/m²  °Wt Readings from Last 3 Encounters:  °03/10/21 (!) 334 lb 11.2 oz (151.8 kg)  °12/06/20 (!) 330 lb (149.7 kg)  °06/21/20 (!) 318 lb (144.2 kg)  ° ° °There are no preventive care reminders to display for this patient. ° °There are no preventive care reminders to display for this patient. ° ° °Lab Results  °Component Value Date  ° TSH 2.59 04/04/2018  ° °Lab Results  °Component Value Date  ° WBC 7.3 12/06/2020  ° HGB 15.0 12/06/2020  ° HCT 44.7 12/06/2020  ° MCV 89.6 12/06/2020  ° PLT 230 12/06/2020  ° °Lab Results  °Component Value Date  ° NA 139 12/06/2020  ° K 4.1 12/06/2020  ° CO2 28 12/06/2020  ° GLUCOSE 90 12/06/2020  ° BUN 11 12/06/2020  ° CREATININE 1.23 (H) 12/06/2020  ° BILITOT 0.4 12/06/2020  ° ALKPHOS 45 04/04/2020  ° AST 8 (L) 12/06/2020  ° ALT 18 12/06/2020  ° PROT 6.7 12/06/2020  ° ALBUMIN 4.1 04/04/2020  ° CALCIUM 9.7 12/06/2020  ° ANIONGAP 7 06/20/2020  ° EGFR 57 (L) 12/06/2020  ° °Lab Results  °Component Value Date  ° CHOL 171 12/06/2020  ° °Lab Results  °Component Value Date  ° HDL 42 (L) 12/06/2020  ° °Lab Results  °Component Value   Date  ° LDLCALC 111 (H) 12/06/2020  ° °Lab Results  °Component Value Date  ° TRIG 87 12/06/2020  ° °Lab Results  °Component Value Date  ° CHOLHDL 4.1 12/06/2020  ° °Lab Results  °Component Value Date  ° HGBA1C 5.3 12/06/2020  ° ° °   °Assessment & Plan:  ° °1. Acute pain of left knee: Knee exam without abnormalities consistent with sprain. X-ray today. Anti-inflammatories as needed. Follow up if symptoms worsen or fail to improve.  ° °- DG Knee Complete 4 Views Left; Future °- naproxen (NAPROSYN) 500 MG tablet; Take 1 tablet (500 mg total) by mouth 2  (two) times daily with a meal for 10 days.  Dispense: 20 tablet; Refill: 0 ° ° °Elisabeth Andrews, DO ° °

## 2021-03-10 NOTE — Patient Instructions (Addendum)
It was great seeing you today! ? ?Plan discussed at today's visit: ?-X-ray ordered  ?-Stronger anti-inflammatory given, can take twice a day as needed for pain, take with food and do NOT take with any other anti-inflammatories  ? ?Follow up in: as needed  ? ?Take care and let us know if you have any questions or concerns prior to your next visit. ? ?Dr. Rosana Berger ? ?Acute Knee Pain, Adult ?Acute knee pain is sudden and may be caused by damage, swelling, or irritation of the muscles and tissues that support the knee. Pain may result from: ?A fall. ?An injury to the knee from twisting motions. ?A hit to the knee. ?Infection. ?Acute knee pain may go away on its own with time and rest. If it does not, your health care provider may order tests to find the cause of the pain. These may include: ?Imaging tests, such as an X-ray, MRI, CT scan, or ultrasound. ?Joint aspiration. In this test, fluid is removed from the knee and evaluated. ?Arthroscopy. In this test, a lighted tube is inserted into the knee and an image is projected onto a TV screen. ?Biopsy. In this test, a sample of tissue is removed from the body and studied under a microscope. ?Follow these instructions at home: ?If you have a knee sleeve or brace: ?Wear the knee sleeve or brace as told by your health care provider. Remove it only as told by your health care provider. ?Loosen it if your toes tingle, become numb, or turn cold and blue. ?Keep it clean. ?If the knee sleeve or brace is not waterproof: ?Do not let it get wet. ?Cover it with a watertight covering when you take a bath or shower. ?Activity ?Rest your knee. ?Do not do things that cause pain or make pain worse. ?Avoid high-impact activities or exercises, such as running, jumping rope, or doing jumping jacks. ?Work with a physical therapist to make a safe exercise program, as recommended by your health care provider. Do exercises as told by your physical therapist. ?Managing pain, stiffness, and  swelling ?If directed, put ice on the affected knee. To do this: ?If you have a removable knee sleeve or brace, remove it as told by your health care provider. ?Put ice in a plastic bag. ?Place a towel between your skin and the bag. ?Leave the ice on for 20 minutes, 2-3 times a day. ?Remove the ice if your skin turns bright red. This is very important. If you cannot feel pain, heat, or cold, you have a greater risk of damage to the area. ?If directed, use an elastic bandage to put pressure (compression) on your injured knee. This may control swelling, give support, and help with discomfort. ?Raise (elevate) your knee above the level of your heart while you are sitting or lying down. ?Sleep with a pillow under your knee. ?General instructions ?Take over-the-counter and prescription medicines only as told by your health care provider. ?Do not use any products that contain nicotine or tobacco, such as cigarettes, e-cigarettes, and chewing tobacco. If you need help quitting, ask your health care provider. ?If you are overweight, work with your health care provider and a dietitian to set a weight-loss goal that is healthy and reasonable for you. Extra weight can put pressure on your knee. ?Pay attention to any changes in your symptoms. ?Keep all follow-up visits. This is important. ?Contact a health care provider if: ?Your knee pain continues, changes, or gets worse. ?You have a fever along with knee pain. ?  Your knee feels warm to the touch or is red. ?Your knee buckles or locks up. ?Get help right away if: ?Your knee swells, and the swelling becomes worse. ?You cannot move your knee. ?You have severe pain in your knee that cannot be managed with pain medicine. ?Summary ?Acute knee pain can be caused by a fall, an injury, an infection, or damage, swelling, or irritation of the tissues that support your knee. ?Your health care provider may perform tests to find out the cause of the pain. ?Pay attention to any changes in  your symptoms. Relieve your pain with rest, medicines, light activity, and the use of ice. ?Get help right away if your knee swells, you cannot move your knee, or you have severe pain that cannot be managed with medicine. ?This information is not intended to replace advice given to you by your health care provider. Make sure you discuss any questions you have with your health care provider. ?Document Revised: 06/10/2019 Document Reviewed: 06/10/2019 ?Elsevier Patient Education ? [place]. ?  ?

## 2021-03-10 NOTE — Telephone Encounter (Signed)
Pt brought in new insurance card and it has been scanned into chart.  Also re-faxed the prior authorization information to the medical fax number that was on card [phone] since the other fax numbers [phone] and [phone] were unsuccessful.  Also informed patient while she was in the office that information has been re-faxed.

## 2021-03-15 ENCOUNTER — Encounter: Payer: Self-pay | Admitting: Internal Medicine

## 2021-03-16 ENCOUNTER — Ambulatory Visit (INDEPENDENT_AMBULATORY_CARE_PROVIDER_SITE_OTHER): Payer: PRIVATE HEALTH INSURANCE | Admitting: Internal Medicine

## 2021-03-16 ENCOUNTER — Encounter: Payer: Self-pay | Admitting: Internal Medicine

## 2021-03-16 ENCOUNTER — Other Ambulatory Visit: Payer: Self-pay

## 2021-03-16 VITALS — BP 124/80 | HR 81 | Temp 98.5°F | Resp 16 | Ht 64.0 in | Wt 338.4 lb

## 2021-03-16 DIAGNOSIS — M25362 Other instability, left knee: Secondary | ICD-10-CM

## 2021-03-16 DIAGNOSIS — M25562 Pain in left knee: Secondary | ICD-10-CM | POA: Diagnosis not present

## 2021-03-16 NOTE — Progress Notes (Signed)
° °Established Patient Office Visit ° °Subjective:  °Patient ID: Barbara Pennington, female    DOB: 07/22/1980  Age: 40 y.o. MRN: [id number] ° °CC:  °Chief Complaint  °Patient presents with  ° Knee Pain  °  Left, worse  ° ° °HPI °Barbara Pennington presents for worsening left knee pain. Was seen in the office last week for the same issue. Left knee pain that ocured about 10 days now, fell getting into the tub and heard a pop. Was weight bearing without issue but having both medial and lateral joint line pain. X-ray obtained which was without abnormality. Gave Naproxen 500 mg BID to take for 10 days. She has been taking the medication once a day, as she thought it was making her nauseous and sleepy. She did not noticed any relief with the medication.  ° °today she states that her pain is worsening in severity and frequency. Located on the lateral joint line constantly at this point with increased swelling.No overlaying skin changes. Pain radiates to the back of the knee although she does have pain under her patella as well. Does endorse feeling like the joint is more unstable now and that it might give out. Using brace, trying to keep it elevated and using ice as well.  ° ° °Past Medical History:  °Diagnosis Date  ° Anxiety   ° Arthritis 2006  ° rhuematoid - mild - no current issues  ° Asthma 2003  ° Complication of anesthesia   ° ran fever after c-section - had infection.  "Usually" has low temp after surgery.  ° Hypertension 2013  ° Lump or mass in breast 2013  ° RIGHT BREAST  ° Motion sickness   ° repeated amusement park rides  ° Scoliosis   ° no current issues  ° Shortness of breath dyspnea   ° secondary to weight   ° Ulcer 2001  ° ° °Past Surgical History:  °Procedure Laterality Date  ° ABDOMINAL HYSTERECTOMY  2008  ° BREAST BIOPSY Left 2013  ° CORE - FIBROADENOMA VS PHYLLODES  ° CESAREAN SECTION  2007  ° COLONOSCOPY WITH PROPOFOL N/A 10/09/2016  ° Procedure: COLONOSCOPY WITH PROPOFOL;  Surgeon: Anna, Kiran, MD;  Location:  [facility];  Service: Gastroenterology;  Laterality: N/A;  ° COLONOSCOPY WITH PROPOFOL N/A 11/09/2019  ° Procedure: COLONOSCOPY WITH PROPOFOL;  Surgeon: Vanga, Rohini Reddy, MD;  Location: [facility];  Service: Gastroenterology;  Laterality: N/A;  ° GUM SURGERY  2004  ° PLANTAR FASCIA RELEASE Left 08/19/2014  ° Procedure: ENDOSCOPIC PLANTAR FASCIOTOMY RELEASE L WITH TOPAZ;  Surgeon: Matthew Troxler, DPM;  Location: [facility];  Service: Podiatry;  Laterality: Left;  LMA WITH POPLITEAL BLOCK  ° TONSILLECTOMY  1984  ° ° °Family History  °Problem Relation Age of Onset  ° Testicular cancer Brother   ° Colon cancer Paternal Grandfather   ° Breast cancer Maternal Grandmother   ° Hypertension Maternal Grandmother   ° Aneurysm Maternal Grandmother   ° Stroke Maternal Grandmother   ° Obesity Mother   ° Diabetes Mother   ° High Cholesterol Mother   ° Hypertension Mother   ° Neuropathy Mother   ° Depression Mother   ° Anxiety disorder Mother   ° Obesity Father   ° Hypertension Father   ° High Cholesterol Father   ° Depression Father   ° Anxiety disorder Father   ° Hypertension Maternal Grandfather   ° High Cholesterol Maternal Grandfather   ° Heart disease Maternal Grandfather   °       has a pacemaker  ° Stroke Maternal Grandfather   ° Alzheimer's disease Paternal Grandmother   ° Dementia Paternal Grandmother   ° ° °Social History  ° °Socioeconomic History  ° Marital status: Divorced  °  Spouse name: Not on file  ° Number of children: 2  ° Years of education: 14  ° Highest education level: Bachelor's degree (e.g., BA, AB, BS)  °Occupational History  ° Not on file  °Tobacco Use  ° Smoking status: Former  °  Packs/day: 0.25  °  Years: 10.00  °  Pack years: 2.50  °  Types: Cigarettes  °  Quit date: 07/05/2014  °  Years since quitting: 6.7  ° Smokeless tobacco: Never  °Vaping Use  ° Vaping Use: Never used  °Substance and Sexual Activity  ° Alcohol use: Yes  °  Comment: rare  ° Drug use: No  ° Sexual activity: Not  Currently  °  Partners: Male  °  Birth control/protection: Surgical  °  Comment: 2007  °Other Topics Concern  ° Not on file  °Social History Narrative  ° Not on file  ° °Social Determinants of Health  ° °Financial Resource Strain: High Risk  ° Difficulty of Paying Living Expenses: Hard  °Food Insecurity: No Food Insecurity  ° Worried About Running Out of Food in the Last Year: Never true  ° Ran Out of Food in the Last Year: Never true  °Transportation Needs: No Transportation Needs  ° Lack of Transportation (Medical): No  ° Lack of Transportation (Non-Medical): No  °Physical Activity: Unknown  ° Days of Exercise per Week: 0 days  ° Minutes of Exercise per Session: Not on file  °Stress: Stress Concern Present  ° Feeling of Stress : To some extent  °Social Connections: Socially Isolated  ° Frequency of Communication with Friends and Family: More than three times a week  ° Frequency of Social Gatherings with Friends and Family: Never  ° Attends Religious Services: Never  ° Active Member of Clubs or Organizations: No  ° Attends Club or Organization Meetings: Not on file  ° Marital Status: Divorced  °Intimate Partner Violence: Not on file  ° ° °Outpatient Medications Prior to Visit  °Medication Sig Dispense Refill  ° acetaminophen (TYLENOL) 325 MG tablet Take 650 mg by mouth every 6 (six) hours as needed.     ° albuterol (VENTOLIN HFA) 108 (90 Base) MCG/ACT inhaler INHALE 2 PUFFS BY MOUTH EVERY 6 HOURS AS NEEDED FOR WHEEZING AND FOR SHORTNESS OF BREATH 18 g 0  ° amLODIPine-Valsartan-HCTZ 5-160-12.5 MG TABS Take 1 tablet by mouth daily at 12 noon. 90 tablet 0  ° atorvastatin (LIPITOR) 20 MG tablet Take 1 tablet (20 mg total) by mouth daily. 90 tablet 1  ° buPROPion (WELLBUTRIN XL) 300 MG 24 hr tablet Take 300 mg by mouth every morning.    ° cholecalciferol (VITAMIN D3) 25 MCG (1000 UNIT) tablet Take 1,000 Units by mouth daily.    ° escitalopram (LEXAPRO) 20 MG tablet Take 1 tablet (20 mg total) by mouth daily. 30 tablet 0   ° Fluticasone-Salmeterol (ADVAIR DISKUS) 100-50 MCG/DOSE AEPB Inhale 1 puff into the lungs in the morning and at bedtime. 60 each 3  ° hydrOXYzine (ATARAX/VISTARIL) 50 MG tablet Take 1 tablet (50 mg total) by mouth 2 (two) times daily. 60 tablet 5  ° montelukast (SINGULAIR) 10 MG tablet Take 1 tablet (10 mg total) by mouth at bedtime. 90 tablet 1  ° naproxen (NAPROSYN) 500 MG tablet   Take 1 tablet (500 mg total) by mouth 2 (two) times daily with a meal for 10 days. 20 tablet 0   Semaglutide,0.25 or 0.5MG/DOS, (OZEMPIC, 0.25 OR 0.5 MG/DOSE,) 2 MG/1.5ML SOPN Inject 0.25-0.5 mg into the skin once a week. 4.5 mL 0   tiZANidine (ZANAFLEX) 4 MG tablet Take 1 tablet (4 mg total) by mouth at bedtime. 90 tablet 0   linaclotide (LINZESS) 290 MCG CAPS capsule Take 1 capsule (290 mcg total) by mouth daily before breakfast. 90 capsule 1   No facility-administered medications prior to visit.    Allergies  Allergen Reactions   Latex Rash    ROS Review of Systems  Constitutional:  Negative for chills and fever.  Musculoskeletal:  Positive for arthralgias, gait problem and joint swelling. Negative for back pain.  Skin: Negative.      Objective:    Physical Exam Constitutional:      Appearance: Normal appearance. She is obese.  HENT:     Head: Normocephalic and atraumatic.  Eyes:     Conjunctiva/sclera: Conjunctivae normal.  Cardiovascular:     Rate and Rhythm: Normal rate and regular rhythm.  Pulmonary:     Effort: Pulmonary effort is normal.     Breath sounds: Normal breath sounds.  Musculoskeletal:        General: Swelling and tenderness present.     Right knee: Normal.     Left knee: Swelling and crepitus present. No erythema or ecchymosis. Tenderness present over the medial joint line and lateral joint line. No LCL laxity, MCL laxity, ACL laxity or PCL laxity.Normal meniscus and normal patellar mobility.     Instability Tests: Anterior drawer test negative. Posterior drawer test negative.  Anterior Lachman test negative. Lateral McMurray test positive. Medial McMurray test negative.     Right lower leg: No edema.     Left lower leg: No edema.  Skin:    General: Skin is warm and dry.  Neurological:     General: No focal deficit present.     Mental Status: She is alert. Mental status is at baseline.  Psychiatric:        Mood and Affect: Mood normal.        Behavior: Behavior normal.    BP 124/80    Pulse 81    Temp 98.5 F (36.9 C)    Resp 16    Ht 5' 4" (1.626 m)    Wt (!) 338 lb 6.4 oz (153.5 kg)    BMI 58.09 kg/m  Wt Readings from Last 3 Encounters:  03/10/21 (!) 334 lb 11.2 oz (151.8 kg)  12/06/20 (!) 330 lb (149.7 kg)  06/21/20 (!) 318 lb (144.2 kg)     There are no preventive care reminders to display for this patient.  There are no preventive care reminders to display for this patient.  Lab Results  Component Value Date   TSH 2.59 04/04/2018   Lab Results  Component Value Date   WBC 7.3 12/06/2020   HGB 15.0 12/06/2020   HCT 44.7 12/06/2020   MCV 89.6 12/06/2020   PLT 230 12/06/2020   Lab Results  Component Value Date   NA 139 12/06/2020   K 4.1 12/06/2020   CO2 28 12/06/2020   GLUCOSE 90 12/06/2020   BUN 11 12/06/2020   CREATININE 1.23 (H) 12/06/2020   BILITOT 0.4 12/06/2020   ALKPHOS 45 04/04/2020   AST 8 (L) 12/06/2020   ALT 18 12/06/2020   PROT 6.7 12/06/2020   ALBUMIN  4.1 04/04/2020   CALCIUM 9.7 12/06/2020   ANIONGAP 7 06/20/2020   EGFR 57 (L) 12/06/2020   Lab Results  Component Value Date   CHOL 171 12/06/2020   Lab Results  Component Value Date   HDL 42 (L) 12/06/2020   Lab Results  Component Value Date   LDLCALC 111 (H) 12/06/2020   Lab Results  Component Value Date   TRIG 87 12/06/2020   Lab Results  Component Value Date   CHOLHDL 4.1 12/06/2020   Lab Results  Component Value Date   HGBA1C 5.3 12/06/2020      Assessment & Plan:   1. Acute pain of left knee/Instability of left knee joint: MRI to rule out  meniscal pathology, continue anti-inflammatories and brace. Work note given for today and tomorrow.   - MR Knee Left  Wo Contrast; Future   Follow-up: Return if symptoms worsen or fail to improve.    Teodora Medici, DO

## 2021-03-16 NOTE — Patient Instructions (Signed)
It was great seeing you today! ? ?Plan discussed at today's visit: ?-MRI ordered, we will call you to schedule this test ?-Can continue anti-inflammatories, Tylenol as needed for pain ?-Can try Voltaren gel to help with pain as well as IcyHot, BenGay, etc.  ?-Continue brace for instability - take off at night  ? ?Follow up in: as needed  ? ?Take care and let us know if you have any questions or concerns prior to your next visit. ? ?Dr. Rosana Berger ? ?

## 2021-03-21 ENCOUNTER — Other Ambulatory Visit: Payer: Self-pay | Admitting: Family Medicine

## 2021-03-21 DIAGNOSIS — R7301 Impaired fasting glucose: Secondary | ICD-10-CM

## 2021-03-22 ENCOUNTER — Telehealth: Payer: Self-pay

## 2021-03-22 ENCOUNTER — Ambulatory Visit: Payer: Self-pay | Admitting: *Deleted

## 2021-03-22 NOTE — Telephone Encounter (Signed)
Summary: knee pain  ? Pt called in to check the status of MRI order for her knee. (Advised) pt mentioned that her knee has gotten worse since seeing the provider. Pt says that her knee is now in consistent pain. Pt says that she is experiencing some swelling. Pt says that she has been taking Naproxin per provider to help.  ? ?Please assist further.   ?  ? ?Reason for Disposition ?? A "snap" or "pop" was heard at the time of injury ? ?Answer Assessment - Initial Assessment Questions ?1. MECHANISM: "How did the injury happen?" (e.g., twisting injury, direct blow)  ?    Fall in shower- heard pop, previous injury- heard pop- pain is worse ?2. ONSET: "When did the injury happen?" (Minutes or hours ago)  ?    03/08/21 ?3. LOCATION: "Where is the injury located?"  ?    L knee ?4. APPEARANCE of INJURY: "What does the injury look like?"  ?    Swelling is still present- patient is icing ?5. SEVERITY: "Can you put weight on that leg?" "Can you walk?"  ?    Patient is wearing brace for support- pain with walking started today- constant ?6. SIZE: For cuts, bruises, or swelling, ask: "How large is it?" (e.g., inches or centimeters;  entire joint)  ?    Swelling-left outside ?7. PAIN: "Is there pain?" If Yes, ask: "How bad is the pain?"  "What does it keep you from doing?" (e.g., Scale 1-10; or mild, moderate, severe) ?  -  NONE: (0): no pain ?  -  MILD (1-3): doesn't interfere with normal activities  ?  -  MODERATE (4-7): interferes with normal activities (e.g., work or school) or awakens from sleep, limping  ?  -  SEVERE (8-10): excruciating pain, unable to do any normal activities, unable to walk ?    moderate ?8. TETANUS: For any breaks in the skin, ask: "When was the last tetanus booster?" ?    na ?9. OTHER SYMPTOMS: "Do you have any other symptoms?"  (e.g., "pop" when knee injured, swelling, locking, buckling)  ?    Pop when slid in shower ?10. PREGNANCY: "Is there any chance you are pregnant?" "When was your last menstrual  period?" ?      *No Answer* ? ?Protocols used: Knee Injury-A-AH ? ?

## 2021-03-22 NOTE — Telephone Encounter (Signed)
Copied from [place] [id number]. Topic: General - Other ?>> Mar 22, 2021 11:10 AM Barbara Pennington wrote: ?Reason for CRM: pt says that she has a MRI order placed. Pt would like to know if provider has received authorization for this yet by her insurance before scheduling?  ? ?Please advise. ?

## 2021-03-22 NOTE — Telephone Encounter (Signed)
I will call her and give her the number for scheduling.  ?

## 2021-03-22 NOTE — Telephone Encounter (Signed)
?  Chief Complaint: follow up to referral- MRI, increasing pain ?Symptoms: pain constant now, swelling still present ?Frequency: injury 03/08/21 ?Pertinent Negatives: Patient denies   ?Disposition: '[]'$ ED /'[]'$ Urgent Care (no appt availability in office) / '[]'$ Appointment(In office/virtual)/ '[]'$  [facility]/ '[]'$ Home Care/ '[]'$ Refused Recommended Disposition /'[]'$ [facility]/ '[x]'$  Follow-up with PCP ?Additional Notes: Patient has been awaiting referral for further testing- MRI has not heard anything. Patient reports increasing constant pain now. Patient has 2 doses of Naproxen left- will need RF or something else for her pain. ?

## 2021-03-24 ENCOUNTER — Encounter: Payer: Self-pay | Admitting: Family Medicine

## 2021-03-24 ENCOUNTER — Other Ambulatory Visit: Payer: Self-pay | Admitting: Family Medicine

## 2021-03-24 DIAGNOSIS — R7301 Impaired fasting glucose: Secondary | ICD-10-CM

## 2021-03-24 DIAGNOSIS — E8881 Metabolic syndrome: Secondary | ICD-10-CM | POA: Insufficient documentation

## 2021-03-24 MED ORDER — OZEMPIC (0.25 OR 0.5 MG/DOSE) 2 MG/1.5ML ~~LOC~~ SOPN: 0.2500 mg | PEN_INJECTOR | SUBCUTANEOUS | 0 refills | Status: DC

## 2021-03-24 NOTE — Telephone Encounter (Signed)
Requested medication (s) are due for refill today: na ? ?Requested medication (s) are on the active medication list: yes ? ?Last refill:  03/24/21 #4.5 ml 0 refills ? ?Future visit scheduled: yes in 2 months ? ?Notes to clinic:  tapered drug . Pharmacy comment: please specify how long they will be on 0.25 mg before increasing. ? ? ?  ?Requested Prescriptions  ?Pending Prescriptions Disp Refills  ? OZEMPIC, 0.25 OR 0.5 MG/DOSE, 2 MG/1.5ML SOPN [Pharmacy Med Name: OZEMPIC 0.25&0.5 '2MG'$ /1.5ML PEN] 6 mL 0  ?  Sig: INJECT 0.25 TO 0.'5MG'$  INTO THE SKIN ONCE A WEEK AS DIRECTED  ?  ? Endocrinology:  Diabetes - GLP-1 Receptor Agonists - semaglutide Failed - 03/24/2021  3:13 PM  ?  ?  Failed - Cr in normal range and within 360 days  ?  Creat  ?Date Value Ref Range Status  ?12/06/2020 1.23 (H) 0.50 - 0.99 mg/dL Final  ? ?Creatinine, Urine  ?Date Value Ref Range Status  ?04/04/2018 182 20 - 275 mg/dL Final  ?  ?  ?  ?  Passed - HBA1C in normal range and within 180 days  ?  Hgb A1c MFr Bld  ?Date Value Ref Range Status  ?12/06/2020 5.3 <5.7 % of total Hgb Final  ?  Comment:  ?  For the purpose of screening for the presence of ?diabetes: ?. ?<5.7%       Consistent with the absence of diabetes ?5.7-6.4%    Consistent with increased risk for diabetes ?            (prediabetes) ?> or =6.5%  Consistent with diabetes ?Marland Kitchen ?This assay result is consistent with a decreased risk ?of diabetes. ?. ?Currently, no consensus exists regarding use of ?hemoglobin A1c for diagnosis of diabetes in children. ?. ?According to American Diabetes Association (ADA) ?guidelines, hemoglobin A1c <7.0% represents optimal ?control in non-pregnant diabetic patients. Different ?metrics may apply to specific patient populations.  ?Standards of Medical Care in Diabetes(ADA). ?. ?  ?  ?  ?  ?  Passed - Valid encounter within last 6 months  ?  Recent Outpatient Visits   ? ?      ? 1 week ago Acute pain of left knee  ? [facility] Teodora Medici,  DO  ? 2 weeks ago Acute pain of left knee  ? [facility] Teodora Medici, DO  ? 3 weeks ago Mild persistent asthma without complication  ? [facility] [place], Drue Stager, MD  ? 3 months ago Morbid obesity with BMI of 50.0-59.9, adult [facility])  ? [facility] [place], Drue Stager, MD  ? 7 months ago Stage 3a chronic kidney disease [facility])  ? [facility] Steele Sizer, MD  ? ?  ?  ?Future Appointments   ? ?        ? In 2 months Steele Sizer, MD [facility], [place]  ? ?  ? ?  ?  ?  ? ?

## 2021-03-26 ENCOUNTER — Ambulatory Visit
Admission: RE | Admit: 2021-03-26 | Discharge: 2021-03-26 | Disposition: A | Discharge status: Home / Self Care | Payer: 59 | Source: Ambulatory Visit | Attending: Internal Medicine | Admitting: Internal Medicine

## 2021-03-26 DIAGNOSIS — M25362 Other instability, left knee: Secondary | ICD-10-CM | POA: Diagnosis present

## 2021-03-26 DIAGNOSIS — M25562 Pain in left knee: Secondary | ICD-10-CM | POA: Diagnosis present

## 2021-03-28 ENCOUNTER — Telehealth: Payer: Self-pay

## 2021-03-28 NOTE — Telephone Encounter (Signed)
Copied from [place] [id number]. Topic: General - Other >> Mar 28, 2021  2:27 PM Bayard Beaver wrote: Reason for CRM: pt called in checking on status of MRI

## 2021-03-29 ENCOUNTER — Telehealth: Payer: Self-pay | Admitting: Family Medicine

## 2021-03-29 ENCOUNTER — Encounter: Payer: Self-pay | Admitting: Internal Medicine

## 2021-03-29 DIAGNOSIS — M25562 Pain in left knee: Secondary | ICD-10-CM

## 2021-03-29 NOTE — Telephone Encounter (Signed)
Medication Refill - Medication: naproxen (NAPROSYN) 500 MG tablet ? ?Has the patient contacted their pharmacy? No. ?(Agent: If no, request that the patient contact the pharmacy for the refill. If patient does not wish to contact the pharmacy document the reason why and proceed with request.) ?(Agent: If yes, when and what did the pharmacy advise?) ? ?Preferred Pharmacy (with phone number or street name): [place] ([facility]), [place] [facility] [address]  ?[place], [place] [address] [place] [address]  ?Phone:  [phone]  Fax:  [phone]  ?Has the patient been seen for an appointment in the last year OR does the patient have an upcoming appointment? Yes.   ? ?Agent: Please be advised that RX refills may take up to 3 business days. We ask that you follow-up with your pharmacy. ?

## 2021-03-30 MED ORDER — NAPROXEN 500 MG PO TABS
500.0000 mg | ORAL_TABLET | Freq: Every day | ORAL | 0 refills | Status: DC | PRN
Start: 2021-03-30 — End: 2021-06-09

## 2021-03-30 NOTE — Telephone Encounter (Signed)
Pt has been informed.

## 2021-03-30 NOTE — Addendum Note (Signed)
Addended by: Teodora Medici on: 03/30/2021 12:15 PM ? ? Modules accepted: Orders ? ?

## 2021-04-03 ENCOUNTER — Inpatient Hospital Stay (INDEPENDENT_AMBULATORY_CARE_PROVIDER_SITE_OTHER): Payer: PRIVATE HEALTH INSURANCE | Admitting: Radiology

## 2021-04-03 ENCOUNTER — Other Ambulatory Visit: Payer: Self-pay

## 2021-04-03 ENCOUNTER — Encounter: Payer: Self-pay | Admitting: Family Medicine

## 2021-04-03 ENCOUNTER — Ambulatory Visit (INDEPENDENT_AMBULATORY_CARE_PROVIDER_SITE_OTHER): Payer: PRIVATE HEALTH INSURANCE | Admitting: Family Medicine

## 2021-04-03 VITALS — BP 136/84 | HR 76 | Ht 64.0 in | Wt 342.0 lb

## 2021-04-03 DIAGNOSIS — M2392 Unspecified internal derangement of left knee: Secondary | ICD-10-CM

## 2021-04-03 DIAGNOSIS — M1712 Unilateral primary osteoarthritis, left knee: Secondary | ICD-10-CM

## 2021-04-03 MED ORDER — DICLOFENAC SODIUM 75 MG PO TBEC: 75.0000 mg | DELAYED_RELEASE_TABLET | Freq: Two times a day (BID) | ORAL | 0 refills | Status: DC

## 2021-04-03 MED ORDER — TRIAMCINOLONE ACETONIDE 40 MG/ML IJ SUSP
40.0000 mg | Freq: Once | INTRAMUSCULAR | Status: AC
  Administered 2021-04-03: 40 mg via INTRAMUSCULAR

## 2021-04-03 NOTE — Assessment & Plan Note (Signed)
Patient presents with acute onset of left anterolateral knee pain in the setting of traumatic onset roughly 3.5 weeks prior.  She noted pain when she had a misstep/slide while stepping into the shower, appreciated a painful pop to the knee, denies any ecchymosis or swelling, did see her PCP office and had x-ray and MRI ordered following Naprosyn course.  While she did note initial response, she finds that despite this medication she still has breakthrough pain primarily with weightbearing, motion after period of immobility, and does note sporadic near buckling episodes, no mechanical locking, no overt buckling or instability related.  She does give a history of "knee sprain "several months prior which resolved spontaneously with time. ? ?Examination shows prominent patellofemoral crepitus with passive flexion/extension, tenderness along the insertion of the patella tendon and medial joint line, equivocal McMurray localizing medially, no laxity with anterior/posterior drawer or with valgus/varus stressing, Lachman benign. ? ?Her clinical history, physical examination, and radiographic findings are most consistent with aggravation of underlying degenerative changes primarily localized about the patellofemoral and medial tibiofemoral articulations, secondary soft tissue involvement at the medial meniscus where there is evidence of degenerative component and at the patellar tendon.  I reviewed the same with the patient as well as additional strategies given her treatments to date. ? ?She did elect to proceed with ultrasound-guided intra-articular corticosteroid injection, additionally I have written for diclofenac for her to utilize in place of naproxen on a twice daily as needed basis until symptoms monitor cortisone, and home exercises for patient to initiate once symptoms allow in effort to slow the progression of the mild/moderate osteoarthritis noted. ? ?She was advised to contact her office at the 2-week mark or  beyond for any lingering symptoms, at that point can consider viscosupplementation, formal PT, and medication management. ? ?Chronic condition, exacerbation, independent interpretation radiography, Rx management ?

## 2021-04-03 NOTE — Assessment & Plan Note (Signed)
See additional assessment(s) for plan details. 

## 2021-04-03 NOTE — Progress Notes (Signed)
?  ? ?Primary Care / Sports Medicine Office Visit ? ?Patient Information:  ?Patient ID: Barbara Pennington, female DOB: 11-08-1980 Age: 41 y.o. MRN: [id number]  ? ?Barbara Pennington is a pleasant 41 y.o. female presenting with the following: ? ?Chief Complaint  ?Patient presents with  ? Knee Pain  ?  Left, Has MRI,  3.5 weeks, slid getting out of shower, heard a pop. Has brace but has not been wearing it. Naproxen is helping. Has not had PT.   ? ? ?Vitals:  ? 04/03/21 1431  ?BP: 136/84  ?Pulse: 76  ?SpO2: 97%  ? ?Vitals:  ? 04/03/21 1431  ?Weight: (!) 342 lb (155.1 kg)  ?Height: '5\' 4"'$  (1.626 m)  ? ?Body mass index is 58.7 kg/m?. ? ?MR Knee Left  Wo Contrast ? ?Result Date: 03/28/2021 ?CLINICAL DATA:  Knee trauma. Internal derangement suspected. Fall in shower 2 weeks ago. Heard a pop in left knee. Lateral pain. EXAM: MRI OF THE LEFT KNEE WITHOUT CONTRAST TECHNIQUE: Multiplanar, multisequence MR imaging of the knee was performed. No intravenous contrast was administered. COMPARISON:  Left knee radiographs 03/10/2021 FINDINGS: MENISCI Medial meniscus: There is mild intermediate proton density signal intrasubstance degeneration within the junction of the body and posterior horn of the medial meniscus. No tear is seen extending through an articular surface. Lateral meniscus:  Intact. LIGAMENTS Cruciates: The ACL and PCL are intact. Collaterals: The medial collateral ligament is intact. The fibular collateral ligament, biceps femoris tendon, iliotibial band, and popliteus tendon are intact. CARTILAGE Patellofemoral: There is moderate thinning of the superior aspect of the junction of the patellar apex and lateral patellar facet cartilage with moderate subchondral cystic change (axial image 7 and sagittal image 21). Mild thinning of the lateral trochlear cartilage. Medial: Mild weight-bearing medial femoral condyle cartilage thinning. Lateral:  Intact. Joint: Smalljoint effusion. Normal Hoffa's fat pad. No plical thickening.  Moderate edema and mild fluid within the deep subcutaneous fat anterior to the patellar tendon and proximal tibia, greatest at the level of the proximal tibia. Popliteal Fossa:  No Baker's cyst. Extensor Mechanism:  Intact quadriceps tendon and patellar tendon. Bones:  No acute fracture or dislocation. Other: None. IMPRESSION:: IMPRESSION: 1. 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 2. Small joint effusion. 3. Intact menisci, cruciate ligaments, and collateral ligaments. Electronically Signed   By: Yvonne Kendall M.D.   On: 03/28/2021 13:10  ? ?DG Knee Complete 4 Views Left ? ?Result Date: 03/11/2021 ?CLINICAL DATA:  Status post fall. EXAM: LEFT KNEE - COMPLETE 4+ VIEW COMPARISON:  None. FINDINGS: No evidence of acute fracture or dislocation. No evidence of arthropathy or other focal bone abnormality. A very small joint effusion is noted. IMPRESSION: 1. No acute osseous abnormality. 2. Very small joint effusion. Electronically Signed   By: Virgina Norfolk M.D.   On: 03/11/2021 23:45    ? ?Independent interpretation of notes and tests performed by another provider:  ? ?Independent interpretation left knee MRI reveals patellofemoral chondral thinning, moderate degree primarily laterally at the femoral condyle, secondary to thinning of the medial tibiofemoral articulation with associated signal normality along the medial meniscus without discrete tear visualized, there is increased T2 signal consistent with effusion. ? ?Procedures performed:  ? ?Procedure:  Injection of left knee under ultrasound guidance. ?Ultrasound guidance utilized for anterolateral out of plane approach, joint space visualized, no effusion noted ?Samsung HS60 device utilized with permanent recording / reporting. ?Verbal informed consent obtained and verified. ?Skin prepped in a sterile fashion. ?Ethyl  chloride for topical local analgesia.  ?Completed without difficulty and tolerated well. ?Medication: triamcinolone  acetonide 40 mg/mL suspension for injection 1 mL total and 2 mL lidocaine 1% without epinephrine utilized for needle placement anesthetic ?Advised to contact for fevers/chills, erythema, induration, drainage, or persistent bleeding. ? ? ?Pertinent History, Exam, Impression, and Recommendations:  ? ?Primary osteoarthritis of left knee ?See additional assessment(s) for plan details. ? ?Internal derangement of left knee ?Patient presents with acute onset of left anterolateral knee pain in the setting of traumatic onset roughly 3.5 weeks prior.  She noted pain when she had a misstep/slide while stepping into the shower, appreciated a painful pop to the knee, denies any ecchymosis or swelling, did see her PCP office and had x-ray and MRI ordered following Naprosyn course.  While she did note initial response, she finds that despite this medication she still has breakthrough pain primarily with weightbearing, motion after period of immobility, and does note sporadic near buckling episodes, no mechanical locking, no overt buckling or instability related.  She does give a history of "knee sprain "several months prior which resolved spontaneously with time. ? ?Examination shows prominent patellofemoral crepitus with passive flexion/extension, tenderness along the insertion of the patella tendon and medial joint line, equivocal McMurray localizing medially, no laxity with anterior/posterior drawer or with valgus/varus stressing, Lachman benign. ? ?Her clinical history, physical examination, and radiographic findings are most consistent with aggravation of underlying degenerative changes primarily localized about the patellofemoral and medial tibiofemoral articulations, secondary soft tissue involvement at the medial meniscus where there is evidence of degenerative component and at the patellar tendon.  I reviewed the same with the patient as well as additional strategies given her treatments to date. ? ?She did elect to  proceed with ultrasound-guided intra-articular corticosteroid injection, additionally I have written for diclofenac for her to utilize in place of naproxen on a twice daily as needed basis until symptoms monitor cortisone, and home exercises for patient to initiate once symptoms allow in effort to slow the progression of the mild/moderate osteoarthritis noted. ? ?She was advised to contact her office at the 2-week mark or beyond for any lingering symptoms, at that point can consider viscosupplementation, formal PT, and medication management. ? ?Chronic condition, exacerbation, independent interpretation radiography, Rx management  ? ?Orders & Medications ?Meds ordered this encounter  ?Medications  ? triamcinolone acetonide (KENALOG-40) injection 40 mg  ? diclofenac (VOLTAREN) 75 MG EC tablet  ?  Sig: Take 1 tablet (75 mg total) by mouth 2 (two) times daily.  ?  Dispense:  30 tablet  ?  Refill:  0  ? ?Orders Placed This Encounter  ?Procedures  ? [name] LIMITED JOINT SPACE STRUCTURES LOW LEFT  ?  ? ?Return if symptoms worsen or fail to improve.  ?  ? ?Montel Culver, MD ? ? Primary Care Sports Medicine ?[facility] ?[place]  ? ?

## 2021-04-03 NOTE — Patient Instructions (Addendum)
You have just been given a cortisone injection to reduce pain and inflammation. After the injection you may notice immediate relief of pain as a result of the Lidocaine. It is important to rest the area of the injection for 24 to 48 hours after the injection. There is a possibility of some temporary increased discomfort and swelling for up to 72 hours until the cortisone begins to work. If you do have pain, simply rest the joint and use ice. If you can tolerate over the counter medications, you can try Tylenol, Aleve, or Advil for added relief per package instructions. ?-As above, relative rest x2 days and rest return normal activity ?- Dose of diclofenac every 12 hours until symptoms respond to cortisone ?- Start home exercises with information at website below: ?https://orthoinfo.aaos.org/globalassets/pdfs/2017-rehab_knee.pdf ?-Contact our office for any lingering knee symptoms at the 2-week mark or beyond ?- Follow-up as needed ?

## 2021-05-05 ENCOUNTER — Ambulatory Visit (INDEPENDENT_AMBULATORY_CARE_PROVIDER_SITE_OTHER): Payer: PRIVATE HEALTH INSURANCE | Admitting: Psychiatry

## 2021-05-05 ENCOUNTER — Encounter: Payer: Self-pay | Admitting: Psychiatry

## 2021-05-05 VITALS — BP 153/88 | HR 93 | Temp 97.8°F | Wt 340.6 lb

## 2021-05-05 DIAGNOSIS — F4310 Post-traumatic stress disorder, unspecified: Secondary | ICD-10-CM

## 2021-05-05 DIAGNOSIS — Z8659 Personal history of other mental and behavioral disorders: Secondary | ICD-10-CM | POA: Insufficient documentation

## 2021-05-05 DIAGNOSIS — F32A Depression, unspecified: Secondary | ICD-10-CM

## 2021-05-05 DIAGNOSIS — F411 Generalized anxiety disorder: Secondary | ICD-10-CM | POA: Diagnosis not present

## 2021-05-05 DIAGNOSIS — F1011 Alcohol abuse, in remission: Secondary | ICD-10-CM | POA: Insufficient documentation

## 2021-05-05 MED ORDER — HYDROXYZINE HCL 50 MG PO TABS: 50.0000 mg | ORAL_TABLET | Freq: Two times a day (BID) | ORAL | 0 refills | Status: DC

## 2021-05-05 MED ORDER — ESCITALOPRAM OXALATE 20 MG PO TABS: 20.0000 mg | ORAL_TABLET | Freq: Every day | ORAL | 0 refills | Status: DC

## 2021-05-05 MED ORDER — BUSPIRONE HCL 7.5 MG PO TABS: 7.5000 mg | ORAL_TABLET | Freq: Two times a day (BID) | ORAL | 0 refills | Status: DC

## 2021-05-05 MED ORDER — BUPROPION HCL ER (XL) 300 MG PO TB24: 300.0000 mg | ORAL_TABLET | Freq: Every morning | ORAL | 0 refills | Status: DC

## 2021-05-05 NOTE — Patient Instructions (Signed)
For therapist - ? ?www.openpathcollective.org ? ?www.psychologytoday ? ? ?Buspirone Tablets ?What is this medication? ?BUSPIRONE (byoo SPYE rone) treats anxiety. It works by balancing the levels of dopamine and serotonin in your brain, hormones that help regulate mood. ?This medicine may be used for other purposes; ask your health care provider or pharmacist if you have questions. ?COMMON BRAND NAME(S): BuSpar, Buspar Dividose ?What should I tell my care team before I take this medication? ?They need to know if you have any of these conditions: ?Kidney or liver disease ?An unusual or allergic reaction to buspirone, other medications, foods, dyes, or preservatives ?Pregnant or trying to get pregnant ?Breast-feeding ?How should I use this medication? ?Take this medication by mouth with a glass of water. Follow the directions on the prescription label. You may take this medication with or without food. To ensure that this medication always works the same way for you, you should take it either always with or always without food. Take your doses at regular intervals. Do not take your medication more often than directed. Do not stop taking except on the advice of your care team. ?Talk to your care team about the use of this medication in children. Special care may be needed. ?Overdosage: If you think you have taken too much of this medicine contact a poison control center or emergency room at once. ?NOTE: This medicine is only for you. Do not share this medicine with others. ?What if I miss a dose? ?If you miss a dose, take it as soon as you can. If it is almost time for your next dose, take only that dose. Do not take double or extra doses. ?What may interact with this medication? ?Do not take this medication with any of the following: ?Linezolid ?MAOIs like Carbex, Eldepryl, Marplan, Nardil, and Parnate ?Methylene blue ?Procarbazine ?This medication may also interact with the  following: ?Diazepam ?Digoxin ?Diltiazem ?Erythromycin ?Grapefruit juice ?Haloperidol ?Medications for mental depression or mood problems ?Medications for seizures like carbamazepine, phenobarbital and phenytoin ?Nefazodone ?Other medications for anxiety ?Rifampin ?Ritonavir ?Some antifungal medications like itraconazole, ketoconazole, and voriconazole ?Verapamil ?Warfarin ?This list may not describe all possible interactions. Give your health care provider a list of all the medicines, herbs, non-prescription drugs, or dietary supplements you use. Also tell them if you smoke, drink alcohol, or use illegal drugs. Some items may interact with your medicine. ?What should I watch for while using this medication? ?Visit your care team for regular checks on your progress. It may take 1 to 2 weeks before your anxiety gets better. ?You may get drowsy or dizzy. Do not drive, use machinery, or do anything that needs mental alertness until you know how this medication affects you. Do not stand or sit up quickly, especially if you are an older patient. This reduces the risk of dizzy or fainting spells. Alcohol can make you more drowsy and dizzy. Avoid alcoholic drinks. ?What side effects may I notice from receiving this medication? ?Side effects that you should report to your care team as soon as possible: ?Allergic reactions--skin rash, itching, hives, swelling of the face, lips, tongue, or throat ?Irritability, confusion, fast or irregular heartbeat, muscle stiffness, twitching muscles, sweating, high fever, seizure, chills, vomiting, diarrhea, which may be signs of serotonin syndrome ?Side effects that usually do not require medical attention (report to your care team if they continue or are bothersome): ?Anxiety or nervousness ?Dizziness ?Drowsiness ?Headache ?Nausea ?Trouble sleeping ?This list may not describe all possible side effects. Call your doctor  for medical advice about side effects. You may report side effects to  FDA at 1-800-FDA-1088. ?Where should I keep my medication? ?Keep out of the reach of children. ?Store at room temperature below 30 degrees C (86 degrees F). Protect from light. Keep container tightly closed. Throw away any unused medication after the expiration date. ?NOTE: This sheet is a summary. It may not cover all possible information. If you have questions about this medicine, talk to your doctor, pharmacist, or health care provider. ?? 2023 Elsevier/Gold Standard (2020-03-24 00:00:00) ? ?
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
## 2021-05-23 ENCOUNTER — Other Ambulatory Visit: Payer: Self-pay | Admitting: Family Medicine

## 2021-05-23 DIAGNOSIS — G8929 Other chronic pain: Secondary | ICD-10-CM

## 2021-05-23 DIAGNOSIS — I10 Essential (primary) hypertension: Secondary | ICD-10-CM

## 2021-05-23 MED ORDER — TIZANIDINE HCL 4 MG PO TABS: 4.0000 mg | ORAL_TABLET | Freq: Every day | ORAL | 0 refills | Status: DC

## 2021-05-23 NOTE — Telephone Encounter (Signed)
Pt states she is completely out ?

## 2021-05-26 ENCOUNTER — Telehealth (INDEPENDENT_AMBULATORY_CARE_PROVIDER_SITE_OTHER): Payer: PRIVATE HEALTH INSURANCE | Admitting: Family Medicine

## 2021-05-26 ENCOUNTER — Ambulatory Visit: Payer: PRIVATE HEALTH INSURANCE | Admitting: Family Medicine

## 2021-05-26 ENCOUNTER — Encounter: Payer: Self-pay | Admitting: Family Medicine

## 2021-05-26 VITALS — Ht 64.0 in | Wt 340.0 lb

## 2021-05-26 DIAGNOSIS — Z6841 Body Mass Index (BMI) 40.0 and over, adult: Secondary | ICD-10-CM

## 2021-05-26 DIAGNOSIS — N1831 Chronic kidney disease, stage 3a: Secondary | ICD-10-CM | POA: Diagnosis not present

## 2021-05-26 DIAGNOSIS — G8929 Other chronic pain: Secondary | ICD-10-CM

## 2021-05-26 DIAGNOSIS — M545 Low back pain, unspecified: Secondary | ICD-10-CM | POA: Diagnosis not present

## 2021-05-26 DIAGNOSIS — E8881 Metabolic syndrome: Secondary | ICD-10-CM

## 2021-05-26 DIAGNOSIS — I10 Essential (primary) hypertension: Secondary | ICD-10-CM

## 2021-05-26 MED ORDER — VALSARTAN-HYDROCHLOROTHIAZIDE 160-25 MG PO TABS: 1.0000 | ORAL_TABLET | Freq: Every morning | ORAL | 0 refills | Status: DC

## 2021-05-26 MED ORDER — OZEMPIC (0.25 OR 0.5 MG/DOSE) 2 MG/3ML ~~LOC~~ SOPN: 0.5000 mg | PEN_INJECTOR | SUBCUTANEOUS | 0 refills | Status: DC

## 2021-05-26 MED ORDER — TIZANIDINE HCL 4 MG PO TABS: 4.0000 mg | ORAL_TABLET | Freq: Every day | ORAL | 0 refills | Status: DC

## 2021-05-26 NOTE — Progress Notes (Signed)
Name: Barbara Pennington   MRN: [id number]    DOB: 07/19/1980   Date:05/26/2021       Progress Note  Subjective  Chief Complaint  Chief Complaint  Patient presents with   Follow-up    I connected with  Raul L Kever  on 05/26/21 at  3:00 PM EDT by a video enabled telemedicine application and verified that I am speaking with the correct person using two identifiers.  I discussed the limitations of evaluation and management by telemedicine and the availability of in person appointments. The patient expressed understanding and agreed to proceed with the virtual visit  Staff also discussed with the patient that there may be a patient responsible charge related to this service. Patient Location: at home  Provider Location: [facility] Additional Individuals present: alone  HPI  Morbid Obesity: her weight was 180 lbs when she graduated HS, when she graduated college/after birth of her first child her weight was up 256 lbs (January 2004 after his birth), she lost weight between 2009-2010 due to severe depression down to around 180 lbs, she gradually started to gain weight again and stay around 220 lbs around 216 lbs, but had plantar fascitis and had surgery so she gained weight after that. In 2019 she injury her back at work and lack of activity also caused more weight gain. Today her weight is 345 lbs - this is her heaviest weight. Her BMI is above 50. She also has knee pain due to Fall and is having to use scooters when shopping or has intense pain on her left knee.   We discussed treatment options, unfortunately she has Medicaid   She tried Rybelsus samples 3 mg and was able to lose 7 lbs, GLP-1 agonist would be very beneficial for her.    She has tried metformin in the past and it caused Diarrhea   She is morbidly obese with OSA, OA, GERD, CKI, glucose intolerance and dyslipidemia    She has tried Atkins diet and Weight Watchers without help, Rickard Patience  Currently she is cutting down on portions,  eating healthier snacks between meals. She does like sweets, she denies binge eating.   Metabolic syndrome with obesity: responded well to GLP-1 in the past and we will try Ozempic again. We will refer to weight loss clinic if not approved by insurance  HTN/ Chronic kidney disease:She states she is following up with Nephrology for monitoring. She does not like taking multiple medications per day, we will change from Losartan 25 and HCTZ 25 and also Norvasc 2.5 mg to Exforge HCTZ 5/160/12.5 mg to increase compliance, however her insurance denied medication and currently she is only taking hczt and losartan. I will change to valsartan hczt and she needs to monitor her bp and return for in person visit     Patient Active Problem List   Diagnosis Date Noted   GAD (generalized anxiety disorder) 05/05/2021   Depressive disorder 05/05/2021   History of borderline personality disorder 05/05/2021   History of alcohol abuse 05/05/2021   Internal derangement of left knee 04/03/2021   Primary osteoarthritis of left knee 42/70/6237   Metabolic syndrome 62/83/1517   Impaired fasting glucose 12/06/2020   Lymphedema 12/06/2020   Moderate episode of recurrent major depressive disorder ([place]) 02/08/2020   CKD (chronic kidney disease) stage 3, GFR 30-59 ml/min (HCC) 06/11/2019   PTSD (post-traumatic stress disorder) 01/14/2019   Borderline personality disorder ([place]) 01/14/2019   Anxiety 01/14/2019   Mixed hyperlipidemia 01/14/2019  Morbid obesity with BMI of 50.0-59.9, adult ([place]) 01/14/2019   OSA on CPAP 01/14/2019   Asthma, mild persistent 04/30/2018   Low back pain 08/20/2017   Fibroadenoma of breast, 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   Essential hypertension     Past Surgical History:  Procedure Laterality Date   ABDOMINAL HYSTERECTOMY  2008   BREAST BIOPSY Left 2013   CORE - FIBROADENOMA VS PHYLLODES   CESAREAN SECTION  2007   COLONOSCOPY WITH PROPOFOL N/A 10/09/2016   Procedure: COLONOSCOPY WITH PROPOFOL;   Surgeon: Jonathon Bellows, MD;  Location: [facility] ENDOSCOPY;  Service: Gastroenterology;  Laterality: N/A;   COLONOSCOPY WITH PROPOFOL N/A 11/09/2019   Procedure: COLONOSCOPY WITH PROPOFOL;  Surgeon: Lin Landsman, MD;  Location: [facility] ENDOSCOPY;  Service: Gastroenterology;  Laterality: N/A;   GUM SURGERY  2004   PLANTAR FASCIA RELEASE Left 08/19/2014   Procedure: ENDOSCOPIC PLANTAR FASCIOTOMY RELEASE L WITH TOPAZ;  Surgeon: Albertine Patricia, DPM;  Location: [facility];  Service: Podiatry;  Laterality: Left;  LMA WITH POPLITEAL BLOCK   TONSILLECTOMY  1984    Family History  Problem Relation Age of Onset   Obesity Mother    Diabetes Mother    High Cholesterol Mother    Hypertension Mother    Neuropathy Mother    Depression Mother    Anxiety disorder Mother    Obesity Father    Hypertension Father    High Cholesterol Father    Depression Father    Anxiety disorder Father    Testicular cancer Brother    Mental illness Maternal Aunt    Hypertension Maternal Grandfather    High Cholesterol Maternal Grandfather    Heart disease Maternal Grandfather        has a pacemaker   Stroke Maternal Grandfather    Breast cancer Maternal Grandmother    Hypertension Maternal Grandmother    Aneurysm Maternal Grandmother    Stroke Maternal Grandmother    Colon cancer Paternal Grandfather    Alzheimer's disease Paternal Grandmother    Dementia Paternal Grandmother    Suicidality Cousin     Social History   Socioeconomic History   Marital status: Divorced    Spouse name: Not on file   Number of children: 2   Years of education: 14   Highest education level: Bachelor's degree (e.g., BA, AB, BS)  Occupational History   Not on file  Tobacco Use   Smoking status: Former    Packs/day: 0.25    Years: 10.00    Pack years: 2.50    Types: Cigarettes    Quit date: 07/05/2014    Years since quitting: 6.8   Smokeless tobacco: Never  Vaping Use   Vaping Use: Never used  Substance and  Sexual Activity   Alcohol use: Not Currently    Comment: rare   Drug use: No   Sexual activity: Yes    Partners: Male    Birth control/protection: Surgical    Comment: 2007  Other Topics Concern   Not on file  Social History Narrative   Not on file   Social Determinants of Health   Financial Resource Strain: High Risk   Difficulty of Paying Living Expenses: Hard  Food Insecurity: No Food Insecurity   Worried About Running Out of Food in the Last Year: Never true   Ran Out of Food in the Last Year: Never true  Transportation Needs: No Transportation Needs   Lack of Transportation (Medical): No   Lack of Transportation (Non-Medical): No  Physical Activity:  Unknown   Days of Exercise per Week: 0 days   Minutes of Exercise per Session: Not on file  Stress: Stress Concern Present   Feeling of Stress : To some extent  Social Connections: Socially Isolated   Frequency of Communication with Friends and Family: More than three times a week   Frequency of Social Gatherings with Friends and Family: Never   Attends Religious Services: Never   Printmaker: No   Attends Music therapist: Not on file   Marital Status: Divorced  Human resources officer Violence: Not on file     Current Outpatient Medications:    albuterol (VENTOLIN HFA) 108 (90 Base) MCG/ACT inhaler, INHALE 2 PUFFS BY MOUTH EVERY 6 HOURS AS NEEDED FOR WHEEZING AND FOR SHORTNESS OF BREATH, Disp: 18 g, Rfl: 0   atorvastatin (LIPITOR) 20 MG tablet, Take 1 tablet (20 mg total) by mouth daily., Disp: 90 tablet, Rfl: 1   buPROPion (WELLBUTRIN XL) 300 MG 24 hr tablet, Take 1 tablet (300 mg total) by mouth every morning., Disp: 90 tablet, Rfl: 0   busPIRone (BUSPAR) 7.5 MG tablet, Take 1 tablet (7.5 mg total) by mouth 2 (two) times daily., Disp: 180 tablet, Rfl: 0   escitalopram (LEXAPRO) 20 MG tablet, Take 1 tablet (20 mg total) by mouth daily., Disp: 90 tablet, Rfl: 0   Fluticasone-Salmeterol  (ADVAIR DISKUS) 100-50 MCG/DOSE AEPB, Inhale 1 puff into the lungs in the morning and at bedtime., Disp: 60 each, Rfl: 3   hydrochlorothiazide (HYDRODIURIL) 25 MG tablet, Take 1 tablet by mouth once daily, Disp: 30 tablet, Rfl: 0   hydrOXYzine (ATARAX) 50 MG tablet, Take 1 tablet (50 mg total) by mouth 2 (two) times daily., Disp: 180 tablet, Rfl: 0   montelukast (SINGULAIR) 10 MG tablet, Take 1 tablet (10 mg total) by mouth at bedtime., Disp: 90 tablet, Rfl: 1   naproxen (NAPROSYN) 500 MG tablet, Take 1 tablet (500 mg total) by mouth daily as needed for moderate pain., Disp: 30 tablet, Rfl: 0   tiZANidine (ZANAFLEX) 4 MG tablet, Take 1 tablet (4 mg total) by mouth at bedtime., Disp: 30 tablet, Rfl: 0  Allergies  Allergen Reactions   Latex Rash    I personally reviewed active problem list, medication list, allergies, family history, social history with the patient/caregiver today.   ROS  Ten systems reviewed and is negative except as mentioned in HPI   Objective  Virtual encounter, vitals not obtained.  There is no height or weight on file to calculate BMI.  Physical Exam  Awake, alert and oriented   PHQ2/9:    05/05/2021    9:39 AM 04/03/2021    2:36 PM 03/16/2021   11:17 AM 03/10/2021    1:51 PM 03/01/2021   10:06 AM  Depression screen PHQ 2/9  Decreased Interest  0 0 0 0  Down, Depressed, Hopeless  1 0 0 0  PHQ - 2 Score  1 0 0 0  Altered sleeping  1 0 0 0  Tired, decreased energy  1 0 0 0  Change in appetite  1 0 0 0  Feeling bad or failure about yourself   1 0 0 0  Trouble concentrating  1 0 0 0  Moving slowly or fidgety/restless  1 0 0 0  Suicidal thoughts  0 0 0 0  PHQ-9 Score  7 0 0 0  Difficult doing work/chores  Somewhat difficult Not difficult at all Not difficult at all  Information is confidential and restricted. Go to Review Flowsheets to unlock data.   PHQ-2/9 Result is N/A.    Fall Risk:    04/03/2021    2:37 PM 03/16/2021   11:17 AM 03/10/2021     1:48 PM 03/01/2021   10:06 AM 12/06/2020    9:26 AM  Fall Risk   Falls in the past year? 1 0 1 0 1  Number falls in past yr: 1 0 0 0 1  Injury with Fall? 1 0 1 0 0  Risk for fall due to :    No Fall Risks No Fall Risks  Follow up    Falls prevention discussed Falls prevention discussed     Assessment & Plan  Problem List Items Addressed This Visit     Morbid obesity with BMI of 50.0-59.9, adult ([place]) - Primary    We will try PA for medication but if not covered she agrees on going to weight loss clinic and if not covered bariatric clinic/evaluation        Relevant Medications   Semaglutide,0.25 or 0.'5MG'$ /DOS, (OZEMPIC, 0.25 OR 0.5 MG/DOSE,) 2 PH/1TA SOPN   Metabolic syndrome   Relevant Medications   Semaglutide,0.25 or 0.'5MG'$ /DOS, (OZEMPIC, 0.25 OR 0.5 MG/DOSE,) 2 MG/3ML SOPN   Essential hypertension   Relevant Medications   valsartan-hydrochlorothiazide (DIOVAN-HCT) 160-25 MG tablet   Low back pain   Relevant Medications   tiZANidine (ZANAFLEX) 4 MG tablet   CKD (chronic kidney disease) stage 3, GFR 30-59 ml/min (HCC)   Relevant Medications   valsartan-hydrochlorothiazide (DIOVAN-HCT) 160-25 MG tablet     I discussed the assessment and treatment plan with the patient. The patient was provided an opportunity to ask questions and all were answered. The patient agreed with the plan and demonstrated an understanding of the instructions.  The patient was advised to call back or seek an in-person evaluation if the symptoms worsen or if the condition fails to improve as anticipated.  I provided 25 minutes of non-face-to-face time during this encounter.

## 2021-05-26 NOTE — Assessment & Plan Note (Signed)
We will try PA for medication but if not covered she agrees on going to weight loss clinic and if not covered bariatric clinic/evaluation

## 2021-05-29 ENCOUNTER — Encounter: Payer: Self-pay | Admitting: Family Medicine

## 2021-05-29 NOTE — Telephone Encounter (Signed)
Please review.  KP

## 2021-05-29 NOTE — Telephone Encounter (Signed)
Front Desk: Please call pt to schedule follow up appointment next available.   Kristi: Do you know about a message about visco/gel injection? I cant find anything in the chart.

## 2021-05-29 NOTE — Telephone Encounter (Signed)
Appt scheduled.  KP 

## 2021-05-29 NOTE — Telephone Encounter (Signed)
Not aware of a message about it, but I will make note for when we start ordering them.

## 2021-05-30 ENCOUNTER — Encounter: Payer: Self-pay | Admitting: Family Medicine

## 2021-05-30 ENCOUNTER — Ambulatory Visit (INDEPENDENT_AMBULATORY_CARE_PROVIDER_SITE_OTHER): Payer: PRIVATE HEALTH INSURANCE | Admitting: Family Medicine

## 2021-05-30 VITALS — BP 128/86 | HR 78 | Ht 64.0 in | Wt 353.2 lb

## 2021-05-30 DIAGNOSIS — M1712 Unilateral primary osteoarthritis, left knee: Secondary | ICD-10-CM | POA: Diagnosis not present

## 2021-05-30 DIAGNOSIS — M2392 Unspecified internal derangement of left knee: Secondary | ICD-10-CM | POA: Diagnosis not present

## 2021-05-30 MED ORDER — CELECOXIB 200 MG PO CAPS: 200.0000 mg | ORAL_CAPSULE | Freq: Two times a day (BID) | ORAL | 0 refills | Status: DC

## 2021-05-30 NOTE — Progress Notes (Signed)
     Primary Care / Sports Medicine Office Visit  Patient Information:  Patient ID: Barbara Pennington, female DOB: September 29, 1980 Age: 41 y.o. MRN: [id number]   [place] is a pleasant 41 y.o. female presenting with the following:  Chief Complaint  Patient presents with   Knee Pain    Left knee pain is worst but right knee is hurting as well. Ice, heat, motrin, tylenol, elevation. Shot only lasted 6 weeks. Feels unstable, is wearing brace    Vitals:   05/30/21 0840  BP: 128/86  Pulse: 78  SpO2: 98%   Vitals:   05/30/21 0840  Weight: (!) 353 lb 3.2 oz (160.2 kg)  Height: '5\' 4"'$  (1.626 m)   Body mass index is 60.63 kg/m.  No results found.   Independent interpretation of notes and tests performed by another provider:   None  Procedures performed:   None  Pertinent History, Exam, Impression, and Recommendations:   Problem List Items Addressed This Visit       Musculoskeletal and Integument   Internal derangement of left knee   Primary osteoarthritis of left knee - Primary    Patient returns for follow-up to left knee pain in the setting of initial trauma sustained early March 2023 timeframe.  At that time she had a misstep/slide while stepping shower, had a pop, MRI was obtained, and she was treated with intra-articular corticosteroid injection at her visit with our office on 04/03/2021. While she does report improvement following that injection, she never had full symptom resolution and continue to dose ibuprofen.  She reports roughly 2 weeks of recurrence/worsening in her pain localized primarily anteriorly, this was further exacerbated by a fall to her knees in a kneeling position yesterday.  Denies any new or different symptoms.  Of note, she is working on weight loss methods with her primary care provider Dr. Steele Sizer.  Examination today reveals limited range of motion due to pain actively, passively can obtain 0-110 degrees, limited by pain, tenderness maximally  about the medial joint line, secondarily to the lateral joint line, maximal discomfort during resisted knee extension and maximal passive flexion, no laxity noted.  Findings are most consistent with ongoing exacerbation of underlying osteoarthritis compounded by degenerative meniscal changes.  I reviewed the same with the patient as well as surgical and nonsurgical treatment strategies.  We will attempt to obtain authorization for viscosupplementation, transition to scheduled Celebrex, and maintain close follow-up in 2 weeks.  If suboptimal progress noted can consider genicular nerve block diagnostic procedure.  I have encouraged various methods for weight loss in the setting of her left knee osteoarthritis.  She will pursue this latter go through her PCP.      Relevant Medications   celecoxib (CELEBREX) 200 MG capsule     Orders & Medications Meds ordered this encounter  Medications   celecoxib (CELEBREX) 200 MG capsule    Sig: Take 1 capsule (200 mg total) by mouth 2 (two) times daily. One to 2 tablets by mouth daily as needed for pain.    Dispense:  60 capsule    Refill:  0   No orders of the defined types were placed in this encounter.    Return in about 1 week (around 06/06/2021) for f/u left knee.     Montel Culver, MD   Primary Care Sports Medicine [place]

## 2021-05-30 NOTE — Patient Instructions (Signed)
-   Take Celebrex 200 mg twice daily with food (no other NSAIDs while on this) - Return in 1-2 weeks for follow-up

## 2021-05-30 NOTE — Assessment & Plan Note (Signed)
Patient returns for follow-up to left knee pain in the setting of initial trauma sustained early March 2023 timeframe.  At that time she had a misstep/slide while stepping shower, had a pop, MRI was obtained, and she was treated with intra-articular corticosteroid injection at her visit with our office on 04/03/2021. While she does report improvement following that injection, she never had full symptom resolution and continue to dose ibuprofen.  She reports roughly 2 weeks of recurrence/worsening in her pain localized primarily anteriorly, this was further exacerbated by a fall to her knees in a kneeling position yesterday.  Denies any new or different symptoms.  Of note, she is working on weight loss methods with her primary care provider Dr. Steele Sizer.  Examination today reveals limited range of motion due to pain actively, passively can obtain 0-110 degrees, limited by pain, tenderness maximally about the medial joint line, secondarily to the lateral joint line, maximal discomfort during resisted knee extension and maximal passive flexion, no laxity noted.  Findings are most consistent with ongoing exacerbation of underlying osteoarthritis compounded by degenerative meniscal changes.  I reviewed the same with the patient as well as surgical and nonsurgical treatment strategies.  We will attempt to obtain authorization for viscosupplementation, transition to scheduled Celebrex, and maintain close follow-up in 2 weeks.  If suboptimal progress noted can consider genicular nerve block diagnostic procedure.  I have encouraged various methods for weight loss in the setting of her left knee osteoarthritis.  She will pursue this latter go through her PCP.

## 2021-05-31 ENCOUNTER — Ambulatory Visit: Payer: PRIVATE HEALTH INSURANCE | Admitting: Family Medicine

## 2021-06-06 NOTE — Telephone Encounter (Signed)
Please advise 

## 2021-06-07 ENCOUNTER — Other Ambulatory Visit: Payer: Self-pay

## 2021-06-07 ENCOUNTER — Telehealth: Payer: PRIVATE HEALTH INSURANCE | Admitting: Psychiatry

## 2021-06-07 DIAGNOSIS — S8991XD Unspecified injury of right lower leg, subsequent encounter: Secondary | ICD-10-CM

## 2021-06-09 ENCOUNTER — Ambulatory Visit: Payer: PRIVATE HEALTH INSURANCE | Admitting: Family Medicine

## 2021-06-09 ENCOUNTER — Ambulatory Visit (INDEPENDENT_AMBULATORY_CARE_PROVIDER_SITE_OTHER): Payer: PRIVATE HEALTH INSURANCE | Admitting: Family Medicine

## 2021-06-09 ENCOUNTER — Encounter: Payer: Self-pay | Admitting: Family Medicine

## 2021-06-09 ENCOUNTER — Ambulatory Visit
Admission: RE | Admit: 2021-06-09 | Discharge: 2021-06-09 | Disposition: A | Discharge status: Home / Self Care | Payer: 59 | Source: Ambulatory Visit | Attending: Family Medicine | Admitting: Family Medicine

## 2021-06-09 ENCOUNTER — Ambulatory Visit
Admission: RE | Admit: 2021-06-09 | Discharge: 2021-06-09 | Disposition: A | Discharge status: Home / Self Care | Payer: 59 | Attending: Family Medicine | Admitting: Family Medicine

## 2021-06-09 VITALS — BP 127/86 | HR 80

## 2021-06-09 DIAGNOSIS — S82024A Nondisplaced longitudinal fracture of right patella, initial encounter for closed fracture: Secondary | ICD-10-CM | POA: Insufficient documentation

## 2021-06-09 DIAGNOSIS — S8991XD Unspecified injury of right lower leg, subsequent encounter: Secondary | ICD-10-CM | POA: Insufficient documentation

## 2021-06-09 NOTE — Progress Notes (Signed)
     Primary Care / Sports Medicine Office Visit  Patient Information:  Patient ID: RACHAEL FERRIE, female DOB: 05-Oct-1980 Age: 41 y.o. MRN: [id number]   [place] is a pleasant 41 y.o. female presenting with the following:  Chief Complaint  Patient presents with   Knee Injury    Right knee, fell on it 8 days ago, went to [facility], was told she broke her patella.     Vitals:   06/09/21 1116  BP: 127/86  Pulse: 80  SpO2: 98%   There were no vitals filed for this visit. There is no height or weight on file to calculate BMI.  No results found.   Independent interpretation of notes and tests performed by another provider:   Independent interpretation of right knee x-rays obtained 06/09/2021 reveal longitudinal lucency through the patella, no sunrise view present in the series, medial tibiofemoral mild narrowing, superior patellar osteophyte, additional acute osseous processes cannot be excluded  Procedures performed:   None  Pertinent History, Exam, Impression, and Recommendations:   Problem List Items Addressed This Visit       Musculoskeletal and Integument   Closed nondisplaced longitudinal fracture of right patella - Primary    Patient presents with acute right anterior knee pain which she describes as starting after stepping up on stairs on 06/06/2021.  At that time she appreciated a loud pop to the anterior right knee, inability to move her knee or ambulate with pain of note, she does describe to bilateral anterior knee sustained on 05/29/2021, however after that episode, her contralateral, left knee was the primarily symptomatic side and she was ambulating without issue on the right knee.  She did see urgent care on 06/06/2021 where x-rays were obtained revealing longitudinal right patellar fracture that is nondisplaced (we do not have access to the images, report was reviewed).  She presents for further evaluation and management options.  Inspection of right knee reveals no  gross deformation, no ecchymosis, she is able to actively perform straight leg raise, tenderness with deep palpation about the superior patella, secondarily to the quadriceps tendon, nontender at the facets, focal region of tenderness along the medial joint line without crepitus, no discrete laxity with Lachman, valgus/varus, anterior/posterior stressing.  Examination limited by acuity of symptoms and habitus.  I discussed both surgical and nonsurgical management options for her right longitudinal patellar fracture.  Patient stresses importance, due to work related complications, on management strategy to allow her to return to full capacity sooner.  In that regard, I have advised a referral to orthopedic surgery to discuss both surgical options or continue with nonsurgical management.  She was placed into a knee immobilizer today and a note for work restrictions was provided.      Relevant Orders   Ambulatory referral to Orthopedic Surgery     Orders & Medications No orders of the defined types were placed in this encounter.  Orders Placed This Encounter  Procedures   Ambulatory referral to Orthopedic Surgery     Return if symptoms worsen or fail to improve.     Montel Culver, MD   Primary Care Sports Medicine [place]

## 2021-06-09 NOTE — Assessment & Plan Note (Signed)
Patient presents with acute right anterior knee pain which she describes as starting after stepping up on stairs on 06/06/2021.  At that time she appreciated a loud pop to the anterior right knee, inability to move her knee or ambulate with pain of note, she does describe to bilateral anterior knee sustained on 05/29/2021, however after that episode, her contralateral, left knee was the primarily symptomatic side and she was ambulating without issue on the right knee.  She did see urgent care on 06/06/2021 where x-rays were obtained revealing longitudinal right patellar fracture that is nondisplaced (we do not have access to the images, report was reviewed).  She presents for further evaluation and management options.  Inspection of right knee reveals no gross deformation, no ecchymosis, she is able to actively perform straight leg raise, tenderness with deep palpation about the superior patella, secondarily to the quadriceps tendon, nontender at the facets, focal region of tenderness along the medial joint line without crepitus, no discrete laxity with Lachman, valgus/varus, anterior/posterior stressing.  Examination limited by acuity of symptoms and habitus.  I discussed both surgical and nonsurgical management options for her right longitudinal patellar fracture.  Patient stresses importance, due to work related complications, on management strategy to allow her to return to full capacity sooner.  In that regard, I have advised a referral to orthopedic surgery to discuss both surgical options or continue with nonsurgical management.  She was placed into a knee immobilizer today and a note for work restrictions was provided.

## 2021-06-09 NOTE — Patient Instructions (Signed)
-   Use knee immobilizer at all times, okay to remove for bathing, loosen for sleeping, periods of prolonged rest (sitting and watching TV) - While knee immobilizer is off, avoid excessively bending (flexing) the knee - Referral coordinator will contact you in regards to scheduling follow-up with orthopedic surgical group - Work note provided recommending work from home restrictions - Follow-up as needed

## 2021-06-12 ENCOUNTER — Other Ambulatory Visit: Payer: Self-pay | Admitting: Family Medicine

## 2021-06-13 ENCOUNTER — Encounter: Payer: Self-pay | Admitting: Psychiatry

## 2021-06-13 ENCOUNTER — Other Ambulatory Visit: Payer: Self-pay | Admitting: Family Medicine

## 2021-06-13 ENCOUNTER — Telehealth (INDEPENDENT_AMBULATORY_CARE_PROVIDER_SITE_OTHER): Payer: PRIVATE HEALTH INSURANCE | Admitting: Psychiatry

## 2021-06-13 DIAGNOSIS — F411 Generalized anxiety disorder: Secondary | ICD-10-CM

## 2021-06-13 DIAGNOSIS — F4310 Post-traumatic stress disorder, unspecified: Secondary | ICD-10-CM | POA: Diagnosis not present

## 2021-06-13 DIAGNOSIS — F4321 Adjustment disorder with depressed mood: Secondary | ICD-10-CM | POA: Insufficient documentation

## 2021-06-13 DIAGNOSIS — F1011 Alcohol abuse, in remission: Secondary | ICD-10-CM

## 2021-06-13 DIAGNOSIS — Z8659 Personal history of other mental and behavioral disorders: Secondary | ICD-10-CM | POA: Diagnosis not present

## 2021-06-13 DIAGNOSIS — S82024A Nondisplaced longitudinal fracture of right patella, initial encounter for closed fracture: Secondary | ICD-10-CM

## 2021-06-13 MED ORDER — HYDROCODONE-ACETAMINOPHEN 5-325 MG PO TABS: 1.0000 | ORAL_TABLET | Freq: Four times a day (QID) | ORAL | 0 refills | Status: DC | PRN

## 2021-06-13 MED ORDER — BUSPIRONE HCL 15 MG PO TABS: 15.0000 mg | ORAL_TABLET | Freq: Two times a day (BID) | ORAL | 0 refills | Status: DC

## 2021-06-13 NOTE — Telephone Encounter (Signed)
Please advise on refill.

## 2021-06-13 NOTE — Telephone Encounter (Signed)
Opioids not recommended for acute fracture past 5 days. She can take extra strength tylenol ('1000mg'$ ) and ibuprofen (up to '800mg'$  per dose) together every 8 hours as needed for pain. If this is not controlling her pain, she should be evaluated in person by Ortho (has appt 6/8) to assess for other complications that could be causing her pain.

## 2021-06-13 NOTE — Progress Notes (Signed)
Virtual Visit via Video Note  I connected with Barbara Pennington on 06/13/21 at  1:00 PM EDT by a video enabled telemedicine application and verified that I am speaking with the correct person using two identifiers.  Location Provider Location : [facility] Patient Location : Home  Participants: Patient , Provider   I discussed the limitations of evaluation and management by telemedicine and the availability of in person appointments. The patient expressed understanding and agreed to proceed.   I discussed the assessment and treatment plan with the patient. The patient was provided an opportunity to ask questions and all were answered. The patient agreed with the plan and demonstrated an understanding of the instructions.   The patient was advised to call back or seek an in-person evaluation if the symptoms worsen or if the condition fails to improve as anticipated.   [place] MD OP Progress Note  06/13/2021 5:59 PM Barbara Pennington  MRN:  [id number]  Chief Complaint:  Chief Complaint  Patient presents with   Follow-up: 41 year old Caucasian female with history of PTSD, GAD, recent knee fracture after a fall presented for medication management.   HPI: Barbara Pennington is a 41 year old Caucasian female, divorced, currently in a relationship with her boyfriend, lives in [place], employed, has a history of PTSD, anxiety, borderline personality disorder, morbid obesity, OSA on CPAP, hypertension/chronic kidney disease, asthma, chronic back pain, recent right-sided knee fracture after a fall, presented for a follow-up appointment.  Patient today reports she had a fall while she was reaching out for something from her bed at night and broke her knee.  Patient reports she has appointment with an orthopedic provider on Thursday.  Currently taking time off from work.  She reports her work considers her as disabled and she does not have any benefits for that.  She hence is worried about taking time off.  Patient  worries about her financial situation, her job, her health.  Patient however reports she does have support system from her family.  Patient reports compared to how she was doing previously the BuSpar addition has definitely helped her with her mood.  She is better able to cope with her mood swings, irritability.  Patient denies any suicidality, homicidality or perceptual disturbances.  Patient reports sleep is overall okay.  Patient however reports she struggles with her appetite since she is worried about her weight gain and that is worse since she has to use crutches to walk.  She however has been forcing herself to eat.  Patient denies any other concerns today.    Visit Diagnosis:    ICD-10-CM   1. PTSD (post-traumatic stress disorder)  F43.10 busPIRone (BUSPAR) 15 MG tablet    2. GAD (generalized anxiety disorder)  F41.1 busPIRone (BUSPAR) 15 MG tablet    3. Adjustment disorder with depressed mood  F43.21     4. History of borderline personality disorder  Z86.59     5. History of alcohol abuse  F10.11       Past Psychiatric History: Reviewed past psychiatric history from progress note on 05/05/2021.  Previously was under the care of [facility].  Past Medical History:  Past Medical History:  Diagnosis Date   Anxiety    Arthritis 2006   rhuematoid - mild - no current issues   Asthma 7829   Complication of anesthesia    ran fever after c-section - had infection.  "Usually" has low temp after surgery.   Depression    Hypertension 2013   Lump  or mass in breast 2013   RIGHT BREAST   Motion sickness    repeated amusement park rides   Scoliosis    no current issues   Shortness of breath dyspnea    secondary to weight    Ulcer 2001    Past Surgical History:  Procedure Laterality Date   ABDOMINAL HYSTERECTOMY  2008   BREAST BIOPSY Left 2013   CORE - FIBROADENOMA VS PHYLLODES   CESAREAN SECTION  2007   COLONOSCOPY WITH PROPOFOL N/A 10/09/2016   Procedure: COLONOSCOPY WITH  PROPOFOL;  Surgeon: Jonathon Bellows, MD;  Location[facility] ENDOSCOPY;  Service: Gastroenterology;  Laterality: N/A;   COLONOSCOPY WITH PROPOFOL N/A 11/09/2019   Procedure: COLONOSCOPY WITH PROPOFOL;  Surgeon: Lin Landsman, MD;  Location: [facility] ENDOSCOPY;  Service: Gastroenterology;  Laterality: N/A;   GUM SURGERY  2004   PLANTAR FASCIA RELEASE Left 08/19/2014   Procedure: ENDOSCOPIC PLANTAR FASCIOTOMY RELEASE L WITH TOPAZ;  Surgeon: Albertine Patricia, DPM;  Location: [facility] [facility];  Service: Podiatry;  Laterality: Left;  LMA WITH POPLITEAL BLOCK   TONSILLECTOMY  1984    Family Psychiatric History: Reviewed family psychiatric history from progress note on 05/05/2021.  Family History:  Family History  Problem Relation Age of Onset   Obesity Mother    Diabetes Mother    High Cholesterol Mother    Hypertension Mother    Neuropathy Mother    Depression Mother    Anxiety disorder Mother    Obesity Father    Hypertension Father    High Cholesterol Father    Depression Father    Anxiety disorder Father    Testicular cancer Brother    Mental illness Maternal Aunt    Hypertension Maternal Grandfather    High Cholesterol Maternal Grandfather    Heart disease Maternal Grandfather        has a pacemaker   Stroke Maternal Grandfather    Breast cancer Maternal Grandmother    Hypertension Maternal Grandmother    Aneurysm Maternal Grandmother    Stroke Maternal Grandmother    Colon cancer Paternal Grandfather    Alzheimer's disease Paternal Grandmother    Dementia Paternal Grandmother    Suicidality Cousin     Social History: Reviewed social history from progress note on 05/05/2021. Social History   Socioeconomic History   Marital status: Divorced    Spouse name: Not on file   Number of children: 2   Years of education: 14   Highest education level: Bachelor's degree (e.g., BA, AB, BS)  Occupational History   Not on file  Tobacco Use   Smoking status: Former    Packs/day: 0.25     Years: 10.00    Pack years: 2.50    Types: Cigarettes    Quit date: 07/05/2014    Years since quitting: 6.9   Smokeless tobacco: Never  Vaping Use   Vaping Use: Never used  Substance and Sexual Activity   Alcohol use: Not Currently    Comment: rare   Drug use: No   Sexual activity: Yes    Partners: Male    Birth control/protection: Surgical    Comment: 2007  Other Topics Concern   Not on file  Social History Narrative   Not on file   Social Determinants of Health   Financial Resource Strain: Not on file  Food Insecurity: Not on file  Transportation Needs: Not on file  Physical Activity: Not on file  Stress: Not on file  Social Connections: Not on file  Allergies:  Allergies  Allergen Reactions   Latex Rash   Metformin And Related     diarrhea    Metabolic Disorder Labs: Lab Results  Component Value Date   HGBA1C 5.3 12/06/2020   MPG 105 12/06/2020   MPG 114 08/06/2019   No results found for: PROLACTIN Lab Results  Component Value Date   CHOL 171 12/06/2020   TRIG 87 12/06/2020   HDL 42 (L) 12/06/2020   CHOLHDL 4.1 12/06/2020   VLDL 27 08/06/2019   LDLCALC 111 (H) 12/06/2020   LDLCALC 91 08/06/2019   Lab Results  Component Value Date   TSH 2.59 04/04/2018   TSH 1.65 08/26/2013    Therapeutic Level Labs: No results found for: LITHIUM No results found for: VALPROATE No components found for:  CBMZ  Current Medications: Current Outpatient Medications  Medication Sig Dispense Refill   albuterol (VENTOLIN HFA) 108 (90 Base) MCG/ACT inhaler INHALE 2 PUFFS BY MOUTH EVERY 6 HOURS AS NEEDED FOR WHEEZING AND FOR SHORTNESS OF BREATH 18 g 0   atorvastatin (LIPITOR) 20 MG tablet Take 1 tablet (20 mg total) by mouth daily. 90 tablet 1   buPROPion (WELLBUTRIN XL) 300 MG 24 hr tablet Take 1 tablet (300 mg total) by mouth every morning. 90 tablet 0   busPIRone (BUSPAR) 15 MG tablet Take 1 tablet (15 mg total) by mouth 2 (two) times daily. Dose change 180  tablet 0   busPIRone (BUSPAR) 7.5 MG tablet Take 1 tablet (7.5 mg total) by mouth 2 (two) times daily. 180 tablet 0   celecoxib (CELEBREX) 200 MG capsule Take 1 capsule (200 mg total) by mouth 2 (two) times daily. One to 2 tablets by mouth daily as needed for pain. 60 capsule 0   escitalopram (LEXAPRO) 20 MG tablet Take 1 tablet (20 mg total) by mouth daily. 90 tablet 0   hydrochlorothiazide (HYDRODIURIL) 25 MG tablet Take by mouth.     HYDROcodone-acetaminophen (NORCO/VICODIN) 5-325 MG tablet Take 1 tablet by mouth every 6 (six) hours as needed. 10 tablet 0   hydrOXYzine (ATARAX) 50 MG tablet Take 1 tablet (50 mg total) by mouth 2 (two) times daily. 180 tablet 0   montelukast (SINGULAIR) 10 MG tablet Take 1 tablet (10 mg total) by mouth at bedtime. 90 tablet 1   tiZANidine (ZANAFLEX) 4 MG tablet Take 1 tablet (4 mg total) by mouth at bedtime. 90 tablet 0   Fluticasone-Salmeterol (ADVAIR DISKUS) 100-50 MCG/DOSE AEPB Inhale 1 puff into the lungs in the morning and at bedtime. (Patient not taking: Reported on 06/13/2021) 60 each 3   No current facility-administered medications for this visit.     Musculoskeletal: Strength & Muscle Tone:  UTA Gait & Station:  Seated Patient leans: N/A  Psychiatric Specialty Exam: Review of Systems  Musculoskeletal:        Rt.sided knee fracture  Psychiatric/Behavioral:  Positive for dysphoric mood. The patient is nervous/anxious.   All other systems reviewed and are negative.  There were no vitals taken for this visit.There is no height or weight on file to calculate BMI.  General Appearance: Casual  Eye Contact:  Fair  Speech:  Clear and Coherent  Volume:  Normal  Mood:  Anxious and Depressed  Affect:  Tearful  Thought Process:  Goal Directed and Descriptions of Associations: Intact  Orientation:  Full (Time, Place, and Person)  Thought Content: Logical   Suicidal Thoughts:  No  Homicidal Thoughts:  No  Memory:  Immediate;   Fair Recent;  Fair Remote;   Fair  Judgement:  Fair  Insight:  Fair  Psychomotor Activity:  Normal  Concentration:  Concentration: Fair and Attention Span: Fair  Recall:  [employer] of Knowledge: Fair  Language: Fair  Akathisia:  No  Handed:  Right  AIMS (if indicated): done  Assets:  Communication Skills Desire for Improvement Housing Social Support  ADL's:  Intact  Cognition: WNL  Sleep:  Fair   Screenings: AIMS    Flowsheet Row Video Visit from 06/13/2021 in [place] Office Visit from 05/05/2021 in [place] Total Score 0 0      GAD-7    [place] Visit from 05/30/2021 in [facility] Video Visit from 05/26/2021 in [facility] Office Visit from 05/05/2021 in [place] Office Visit from 04/03/2021 in [facility] Video Visit from 08/09/2020 in [facility]  Total GAD-7 Score '11 7 17 7 10      '$ PHQ2-9    Flowsheet Row Video Visit from 06/13/2021 in [place] Office Visit from 05/30/2021 in [facility] Video Visit from 05/26/2021 in [facility] Office Visit from 05/05/2021 in [place] Office Visit from 04/03/2021 in [facility]  PHQ-2 Total Score 0 '2 1 2 1  '$ PHQ-9 Total Score '4 7 7 16 7      '$ Flowsheet Row Video Visit from 06/13/2021 in [place] Office Visit from 05/05/2021 in [place] [facility] from 06/20/2020 in [place] CATEGORY No Risk Low Risk No Risk        Assessment and Plan: Barbara Pennington is a 41 year old Caucasian female with history of PTSD, borderline personality disorder, depression, GAD, previously under the care of [facility] presented for medication management, does have multiple psychosocial stressors including  job related stressors, recent knee fracture after a fall.  Patient currently tolerating BuSpar, will benefit from the following plan.  Plan PTSD-improving Lexapro 20 mg p.o. daily Increase BuSpar to 15 mg p.o. twice daily Patient advised to establish care with therapist-provided resources in the community.  GAD-unstable Increase BuSpar to 15 mg p.o. twice daily Lexapro 20 mg p.o. daily Hydroxyzine 50 mg p.o. twice daily Referred for CBT  Adjustment disorder with depressed mood-unstable Continue Wellbutrin XL 300 mg p.o. daily in the morning Continue Lexapro 20 mg p.o. daily Patient will need psychotherapy sessions  History of borderline personality disorder-requested medical records-pending  History of alcohol abuse-we will monitor closely.  Sober since 2017.   Reviewed notes per Dr. Corene Cornea Matthews-dated 06/09/2021-patient with closed nondisplaced fracture of right patella-has upcoming appointment with orthopedic provider.  Follow-up in clinic in 4 to 5 weeks or sooner if needed.  Collaboration of Care: Collaboration of Care: Referral or follow-up with counselor/therapist AEB encouraged to establish care with therapist.  Patient/Guardian was advised Release of Information must be obtained prior to any record release in order to collaborate their care with an outside provider. Patient/Guardian was advised if they have not already done so to contact the registration department to sign all necessary forms in order for [name] to release information regarding their care.   Consent: Patient/Guardian gives verbal consent for treatment and assignment of benefits for services provided during this visit. Patient/Guardian expressed understanding and agreed to proceed.   This note was generated in part or whole with voice recognition software. Voice recognition is  usually quite accurate but there are transcription errors that can and very often do occur. I apologize for any typographical errors that  were not detected and corrected.      Ursula Alert, MD 06/13/2021, 5:59 PM

## 2021-06-13 NOTE — Telephone Encounter (Signed)
Called patient with no answer. Left detailed voicemail.

## 2021-06-15 DIAGNOSIS — I10 Essential (primary) hypertension: Secondary | ICD-10-CM | POA: Insufficient documentation

## 2021-06-20 ENCOUNTER — Other Ambulatory Visit: Payer: Self-pay | Admitting: Orthopedic Surgery

## 2021-06-20 ENCOUNTER — Other Ambulatory Visit: Payer: Self-pay | Admitting: Family Medicine

## 2021-06-20 ENCOUNTER — Other Ambulatory Visit (HOSPITAL_COMMUNITY): Payer: Self-pay | Admitting: Orthopedic Surgery

## 2021-06-20 DIAGNOSIS — M958 Other specified acquired deformities of musculoskeletal system: Secondary | ICD-10-CM

## 2021-06-20 DIAGNOSIS — M1711 Unilateral primary osteoarthritis, right knee: Secondary | ICD-10-CM

## 2021-06-20 DIAGNOSIS — M1712 Unilateral primary osteoarthritis, left knee: Secondary | ICD-10-CM

## 2021-06-20 DIAGNOSIS — S82001A Unspecified fracture of right patella, initial encounter for closed fracture: Secondary | ICD-10-CM

## 2021-06-20 NOTE — Telephone Encounter (Signed)
Requested medication (s) are due for refill today: yes  Requested medication (s) are on the active medication list: yes  Last refill:  05/30/21 #60 0 refills  Future visit scheduled: no  Notes to clinic:  last ordered by Collins Scotland, MD. Do you want to continue refills?     Requested Prescriptions  Pending Prescriptions Disp Refills   celecoxib (CELEBREX) 200 MG capsule [Pharmacy Med Name: Celecoxib 200 MG Oral Capsule] 60 capsule 0    Sig: TAKE 1 TO 2 CAPSULES BY MOUTH DAILY AS DIRECTED AS NEEDED FOR PAIN     Analgesics:  COX2 Inhibitors Failed - 06/20/2021 11:02 AM      Failed - Manual Review: Labs are only required if the patient has taken medication for more than 8 weeks.      Failed - Cr in normal range and within 360 days    Creat  Date Value Ref Range Status  12/06/2020 1.23 (H) 0.50 - 0.99 mg/dL Final   Creatinine, Urine  Date Value Ref Range Status  04/04/2018 182 20 - 275 mg/dL Final         Failed - AST in normal range and within 360 days    AST  Date Value Ref Range Status  12/06/2020 8 (L) 10 - 30 U/L Final   SGOT(AST)  Date Value Ref Range Status  08/26/2013 < 5 (L) 15 - 37 Unit/L Final         Passed - HGB in normal range and within 360 days    Hemoglobin  Date Value Ref Range Status  12/06/2020 15.0 11.7 - 15.5 g/dL Final   HGB  Date Value Ref Range Status  08/26/2013 13.5 12.0 - 16.0 g/dL Final         Passed - HCT in normal range and within 360 days    HCT  Date Value Ref Range Status  12/06/2020 44.7 35.0 - 45.0 % Final  08/26/2013 41.9 35.0 - 47.0 % Final         Passed - ALT in normal range and within 360 days    ALT  Date Value Ref Range Status  12/06/2020 18 6 - 29 U/L Final   SGPT (ALT)  Date Value Ref Range Status  08/26/2013 19 U/L Final    Comment:    14-63 NOTE: New Reference Range 07/28/13          Passed - eGFR is 30 or above and within 360 days    GFR, Est African American  Date Value Ref Range Status  06/08/2020  61 > OR = 60 mL/min/1.22m Final   GFR, Est Non African American  Date Value Ref Range Status  06/08/2020 52 (L) > OR = 60 mL/min/1.722mFinal   GFR, Estimated  Date Value Ref Range Status  06/20/2020 53 (L) >60 mL/min Final    Comment:    (NOTE) Calculated using the CKD-EPI Creatinine Equation (2021)    eGFR  Date Value Ref Range Status  12/06/2020 57 (L) > OR = 60 mL/min/1.7327minal    Comment:    The eGFR is based on the CKD-EPI 2021 equation. To calculate  the new eGFR from a previous Creatinine or Cystatin C result, go to https://www.kidney.org/professionals/ kdoqi/gfr%5Fcalculator          Passed - Patient is not pregnant      Passed - Valid encounter within last 12 months    Recent Outpatient Visits           1  week ago Closed nondisplaced longitudinal fracture of right patella, initial encounter   [facility] Montel Culver, MD   3 weeks ago Primary osteoarthritis of left knee   [facility] Montel Culver, MD   3 weeks ago Morbid obesity with BMI of 50.0-59.9, adult [facility])   [facility] Steele Sizer, MD   2 months ago Internal derangement of left knee   [facility] Montel Culver, MD   3 months ago Acute pain of left knee   [facility] Teodora Medici, [place]

## 2021-06-25 ENCOUNTER — Ambulatory Visit
Admission: RE | Admit: 2021-06-25 | Discharge: 2021-06-25 | Disposition: A | Discharge status: Home / Self Care | Payer: 59 | Source: Ambulatory Visit | Attending: Orthopedic Surgery | Admitting: Orthopedic Surgery

## 2021-06-25 DIAGNOSIS — M958 Other specified acquired deformities of musculoskeletal system: Secondary | ICD-10-CM | POA: Insufficient documentation

## 2021-06-25 DIAGNOSIS — M1711 Unilateral primary osteoarthritis, right knee: Secondary | ICD-10-CM | POA: Diagnosis present

## 2021-06-25 DIAGNOSIS — S82001A Unspecified fracture of right patella, initial encounter for closed fracture: Secondary | ICD-10-CM | POA: Insufficient documentation

## 2021-07-02 ENCOUNTER — Other Ambulatory Visit: Payer: Self-pay | Admitting: Family Medicine

## 2021-07-02 ENCOUNTER — Encounter: Payer: Self-pay | Admitting: Family Medicine

## 2021-07-02 DIAGNOSIS — M1712 Unilateral primary osteoarthritis, left knee: Secondary | ICD-10-CM

## 2021-07-03 ENCOUNTER — Other Ambulatory Visit: Payer: Self-pay

## 2021-07-03 ENCOUNTER — Other Ambulatory Visit: Payer: Self-pay | Admitting: Family Medicine

## 2021-07-03 DIAGNOSIS — N1831 Chronic kidney disease, stage 3a: Secondary | ICD-10-CM

## 2021-07-03 DIAGNOSIS — I10 Essential (primary) hypertension: Secondary | ICD-10-CM

## 2021-07-03 MED ORDER — AMLODIPINE BESYLATE 2.5 MG PO TABS: 2.5000 mg | ORAL_TABLET | Freq: Every day | ORAL | 0 refills | Status: DC

## 2021-07-03 MED ORDER — VALSARTAN-HYDROCHLOROTHIAZIDE 160-12.5 MG PO TABS: 1.0000 | ORAL_TABLET | Freq: Every day | ORAL | 0 refills | Status: DC

## 2021-07-10 ENCOUNTER — Other Ambulatory Visit: Payer: Self-pay | Admitting: Orthopedic Surgery

## 2021-07-17 ENCOUNTER — Encounter
Admission: RE | Admit: 2021-07-17 | Discharge: 2021-07-17 | Disposition: A | Discharge status: Home / Self Care | Payer: 59 | Source: Ambulatory Visit | Attending: Orthopedic Surgery | Admitting: Orthopedic Surgery

## 2021-07-17 ENCOUNTER — Ambulatory Visit: Payer: PRIVATE HEALTH INSURANCE

## 2021-07-17 VITALS — Ht 65.0 in | Wt 349.0 lb

## 2021-07-17 DIAGNOSIS — I10 Essential (primary) hypertension: Secondary | ICD-10-CM

## 2021-07-17 DIAGNOSIS — Z01818 Encounter for other preprocedural examination: Secondary | ICD-10-CM

## 2021-07-17 HISTORY — DX: Sleep apnea, unspecified: G47.30

## 2021-07-17 HISTORY — DX: Gastro-esophageal reflux disease without esophagitis: K21.9

## 2021-07-17 HISTORY — DX: Personal history of urinary calculi: Z87.442

## 2021-07-17 HISTORY — DX: Hyperlipidemia, unspecified: E78.5

## 2021-07-17 HISTORY — DX: Borderline personality disorder: F60.3

## 2021-07-17 HISTORY — DX: Chronic kidney disease, unspecified: N18.9

## 2021-07-17 HISTORY — DX: Post-traumatic stress disorder, unspecified: F43.10

## 2021-07-17 NOTE — Patient Instructions (Addendum)
Your procedure is scheduled on:07-24-21 Monday Report to the Registration Desk on the 1st floor of the [facility].Then proceed to the 2nd floor Surgery Desk To find out your arrival time, please call [phone] between 1PM - 3PM on:07-21-21 Friday If your arrival time is 6:00 am, do not arrive prior to that time as the [facility] entrance doors do not open until 6:00 am.  REMEMBER: Instructions that are not followed completely may result in serious medical risk, up to and including death; or upon the discretion of your surgeon and anesthesiologist your surgery may need to be rescheduled.  Do not eat food after midnight the night before surgery.  No gum chewing, lozengers or hard candies.  You may however, drink CLEAR liquids up to 2 hours before you are scheduled to arrive for your surgery. Do not drink anything within 2 hours of your scheduled arrival time.  Clear liquids include: - water  - apple juice without pulp - gatorade (not RED colors) - black coffee or tea (Do NOT add milk or creamers to the coffee or tea) Do NOT drink anything that is not on this list.  In addition, your doctor has ordered for you to drink the provided  Ensure Pre-Surgery Clear Carbohydrate Drink  Drinking this carbohydrate drink up to two hours before surgery helps to reduce insulin resistance and improve patient outcomes. Please complete drinking 2 hours prior to scheduled arrival time.  TAKE THESE MEDICATIONS THE MORNING OF SURGERY WITH A SIP OF WATER: -amLODipine (NORVASC)  -atorvastatin (LIPITOR) -buPROPion (WELLBUTRIN XL)  -escitalopram (LEXAPRO) -hydrOXYzine (ATARAX)   Use your Albuterol Inhaler the day of surgery and bring your Inhaler to the hospital   One week prior to surgery: Stop Anti-inflammatories (NSAIDS) such as Advil, Aleve, Ibuprofen, Motrin, Naproxen, Naprosyn and Aspirin based products such as Excedrin, Goodys Powder, BC Powder.You may however, take Tylenol/Hydrocodone if needed  for pain up until the day of surgery.  Stop ANY OVER THE COUNTER supplements/vitamins NOW (07-17-21)until after surgery.  No Alcohol for 24 hours before or after surgery.  No Smoking including e-cigarettes for 24 hours prior to surgery.  No chewable tobacco products for at least 6 hours prior to surgery.  No nicotine patches on the day of surgery.  Do not use any "recreational" drugs for at least a week prior to your surgery.  Please be advised that the combination of cocaine and anesthesia may have negative outcomes, up to and including death. If you test positive for cocaine, your surgery will be cancelled.  On the morning of surgery brush your teeth with toothpaste and water, you may rinse your mouth with mouthwash if you wish. Do not swallow any toothpaste or mouthwash.  Use CHG Soap as directed on instruction sheet.  Do not wear jewelry, make-up, hairpins, clips or nail polish.  Do not wear lotions, powders, or perfumes.   Do not shave body from the neck down 48 hours prior to surgery just in case you cut yourself which could leave a site for infection.  Also, freshly shaved skin may become irritated if using the CHG soap.  Contact lenses, hearing aids and dentures may not be worn into surgery.  Do not bring valuables to the hospital. [facility] is not responsible for any missing/lost belongings or valuables.   Bring your C-PAP to the hospital with you  Notify your doctor if there is any change in your medical condition (cold, fever, infection).  Wear comfortable clothing (specific to your surgery type)  to the hospital.  After surgery, you can help prevent lung complications by doing breathing exercises.  Take deep breaths and cough every 1-2 hours. Your doctor may order a device called an Incentive Spirometer to help you take deep breaths. When coughing or sneezing, hold a pillow firmly against your incision with both hands. This is called "splinting." Doing this helps  protect your incision. It also decreases belly discomfort.  If you are being admitted to the hospital overnight, leave your suitcase in the car. After surgery it may be brought to your room.  If you are being discharged the day of surgery, you will not be allowed to drive home. You will need a responsible adult (18 years or older) to drive you home and stay with you that night.   If you are taking public transportation, you will need to have a responsible adult (18 years or older) with you. Please confirm with your physician that it is acceptable to use public transportation.   Please call the [facility] at [phone] if you have any questions about these instructions.  Surgery Visitation Policy:  Patients undergoing a surgery or procedure may have two family members or support persons with them as long as the person is not COVID-19 positive or experiencing its symptoms.   How to Use an Incentive Spirometer An incentive spirometer is a tool that measures how well you are filling your lungs with each breath. Learning to take long, deep breaths using this tool can help you keep your lungs clear and active. This may help to reverse or lessen your chance of developing breathing (pulmonary) problems, especially infection. You may be asked to use a spirometer: After a surgery. If you have a lung problem or a history of smoking. After a long period of time when you have been unable to move or be active. If the spirometer includes an indicator to show the highest number that you have reached, your health care provider or respiratory therapist will help you set a goal. Keep a log of your progress as told by your health care provider. What are the risks? Breathing too quickly may cause dizziness or cause you to pass out. Take your time so you do not get dizzy or light-headed. If you are in pain, you may need to take pain medicine before doing incentive spirometry. It is harder to  take a deep breath if you are having pain. How to use your incentive spirometer  Sit up on the edge of your bed or on a chair. Hold the incentive spirometer so that it is in an upright position. Before you use the spirometer, breathe out normally. Place the mouthpiece in your mouth. Make sure your lips are closed tightly around it. Breathe in slowly and as deeply as you can through your mouth, causing the piston or the ball to rise toward the top of the chamber. Hold your breath for 3-5 seconds, or for as long as possible. If the spirometer includes a coach indicator, use this to guide you in breathing. Slow down your breathing if the indicator goes above the marked areas. Remove the mouthpiece from your mouth and breathe out normally. The piston or ball will return to the bottom of the chamber. Rest for a few seconds, then repeat the steps 10 or more times. Take your time and take a few normal breaths between deep breaths so that you do not get dizzy or light-headed. Do this every 1-2 hours when you are  awake. If the spirometer includes a goal marker to show the highest number you have reached (best effort), use this as a goal to work toward during each repetition. After each set of 10 deep breaths, cough a few times. This will help to make sure that your lungs are clear. If you have an incision on your chest or abdomen from surgery, place a pillow or a rolled-up towel firmly against the incision when you cough. This can help to reduce pain while taking deep breaths and coughing. General tips When you are able to get out of bed: Walk around often. Continue to take deep breaths and cough in order to clear your lungs. Keep using the incentive spirometer until your health care provider says it is okay to stop using it. If you have been in the hospital, you may be told to keep using the spirometer at home. Contact a health care provider if: You are having difficulty using the spirometer. You  have trouble using the spirometer as often as instructed. Your pain medicine is not giving enough relief for you to use the spirometer as told. You have a fever. Get help right away if: You develop shortness of breath. You develop a cough with bloody mucus from the lungs. You have fluid or blood coming from an incision site after you cough. Summary An incentive spirometer is a tool that can help you learn to take long, deep breaths to keep your lungs clear and active. You may be asked to use a spirometer after a surgery, if you have a lung problem or a history of smoking, or if you have been inactive for a long period of time. Use your incentive spirometer as instructed every 1-2 hours while you are awake. If you have an incision on your chest or abdomen, place a pillow or a rolled-up towel firmly against your incision when you cough. This will help to reduce pain. Get help right away if you have shortness of breath, you cough up bloody mucus, or blood comes from your incision when you cough. This information is not intended to replace advice given to you by your health care provider. Make sure you discuss any questions you have with your health care provider. Document Revised: 03/16/2019 Document Reviewed: 03/16/2019 Elsevier Patient Education  [employer].

## 2021-07-18 ENCOUNTER — Encounter
Admission: RE | Admit: 2021-07-18 | Discharge: 2021-07-18 | Disposition: A | Discharge status: Home / Self Care | Payer: PRIVATE HEALTH INSURANCE | Source: Ambulatory Visit | Attending: Orthopedic Surgery | Admitting: Orthopedic Surgery

## 2021-07-18 ENCOUNTER — Encounter: Payer: Self-pay | Admitting: Family Medicine

## 2021-07-18 DIAGNOSIS — Z01818 Encounter for other preprocedural examination: Secondary | ICD-10-CM | POA: Insufficient documentation

## 2021-07-18 DIAGNOSIS — Z6841 Body Mass Index (BMI) 40.0 and over, adult: Secondary | ICD-10-CM | POA: Insufficient documentation

## 2021-07-18 DIAGNOSIS — Z01810 Encounter for preprocedural cardiovascular examination: Secondary | ICD-10-CM | POA: Diagnosis not present

## 2021-07-18 DIAGNOSIS — I10 Essential (primary) hypertension: Secondary | ICD-10-CM | POA: Insufficient documentation

## 2021-07-18 LAB — CBC
HCT: 41.1 % (ref 36.0–46.0)
Hemoglobin: 13.6 g/dL (ref 12.0–15.0)
MCH: 30 pg (ref 26.0–34.0)
MCHC: 33.1 g/dL (ref 30.0–36.0)
MCV: 90.7 fL (ref 80.0–100.0)
Platelets: 219 10*3/uL (ref 150–400)
RBC: 4.53 MIL/uL (ref 3.87–5.11)
RDW: 12.7 % (ref 11.5–15.5)
WBC: 9.3 10*3/uL (ref 4.0–10.5)
nRBC: 0 % (ref 0.0–0.2)

## 2021-07-18 LAB — BASIC METABOLIC PANEL
Anion gap: 10 (ref 5–15)
BUN: 14 mg/dL (ref 6–20)
CO2: 26 mmol/L (ref 22–32)
Calcium: 9.5 mg/dL (ref 8.9–10.3)
Chloride: 103 mmol/L (ref 98–111)
Creatinine, Ser: 1.22 mg/dL — ABNORMAL HIGH (ref 0.44–1.00)
GFR, Estimated: 57 mL/min — ABNORMAL LOW (ref >60)
Glucose, Bld: 103 mg/dL — ABNORMAL HIGH (ref 70–99)
Potassium: 3.4 mmol/L — ABNORMAL LOW (ref 3.5–5.1)
Sodium: 139 mmol/L (ref 135–145)

## 2021-07-20 ENCOUNTER — Ambulatory Visit: Payer: PRIVATE HEALTH INSURANCE | Admitting: Internal Medicine

## 2021-07-24 ENCOUNTER — Ambulatory Visit: Payer: PRIVATE HEALTH INSURANCE

## 2021-07-24 ENCOUNTER — Ambulatory Visit: Payer: PRIVATE HEALTH INSURANCE | Admitting: Anesthesiology

## 2021-07-24 ENCOUNTER — Ambulatory Visit
Admission: RE | Admit: 2021-07-24 | Discharge: 2021-07-24 | Disposition: A | Discharge status: Home / Self Care | Payer: PRIVATE HEALTH INSURANCE | Attending: Orthopedic Surgery | Admitting: Orthopedic Surgery

## 2021-07-24 ENCOUNTER — Other Ambulatory Visit: Payer: Self-pay

## 2021-07-24 ENCOUNTER — Encounter
Admission: RE | Disposition: A | Discharge status: Home / Self Care | Payer: Self-pay | Source: Home / Self Care | Attending: Orthopedic Surgery

## 2021-07-24 ENCOUNTER — Encounter: Payer: Self-pay | Admitting: Orthopedic Surgery

## 2021-07-24 DIAGNOSIS — S83241A Other tear of medial meniscus, current injury, right knee, initial encounter: Secondary | ICD-10-CM | POA: Diagnosis present

## 2021-07-24 DIAGNOSIS — Z79899 Other long term (current) drug therapy: Secondary | ICD-10-CM | POA: Diagnosis not present

## 2021-07-24 DIAGNOSIS — Z6841 Body Mass Index (BMI) 40.0 and over, adult: Secondary | ICD-10-CM | POA: Insufficient documentation

## 2021-07-24 DIAGNOSIS — X58XXXA Exposure to other specified factors, initial encounter: Secondary | ICD-10-CM | POA: Insufficient documentation

## 2021-07-24 DIAGNOSIS — M069 Rheumatoid arthritis, unspecified: Secondary | ICD-10-CM | POA: Insufficient documentation

## 2021-07-24 DIAGNOSIS — J449 Chronic obstructive pulmonary disease, unspecified: Secondary | ICD-10-CM | POA: Diagnosis not present

## 2021-07-24 DIAGNOSIS — M1711 Unilateral primary osteoarthritis, right knee: Secondary | ICD-10-CM | POA: Insufficient documentation

## 2021-07-24 DIAGNOSIS — Z87891 Personal history of nicotine dependence: Secondary | ICD-10-CM | POA: Diagnosis not present

## 2021-07-24 DIAGNOSIS — F419 Anxiety disorder, unspecified: Secondary | ICD-10-CM | POA: Insufficient documentation

## 2021-07-24 DIAGNOSIS — I10 Essential (primary) hypertension: Secondary | ICD-10-CM | POA: Insufficient documentation

## 2021-07-24 HISTORY — PX: KNEE ARTHROSCOPY WITH MEDIAL MENISECTOMY: SHX5651

## 2021-07-24 SURGERY — ARTHROSCOPY, KNEE, WITH MEDIAL MENISCECTOMY
Anesthesia: General | Site: Knee | Laterality: Right

## 2021-07-24 MED ORDER — EPINEPHRINE PF 1 MG/ML IJ SOLN
INTRAMUSCULAR | Status: AC
  Filled 2021-07-24: qty 1

## 2021-07-24 MED ORDER — EPHEDRINE 5 MG/ML INJ
INTRAVENOUS | Status: AC
  Filled 2021-07-24: qty 5

## 2021-07-24 MED ORDER — LIDOCAINE HCL (PF) 1 % IJ SOLN
INTRAMUSCULAR | Status: DC | PRN
  Administered 2021-07-24 (×2): 1 mL via SUBCUTANEOUS

## 2021-07-24 MED ORDER — ONDANSETRON 4 MG PO TBDP: 4.0000 mg | ORAL_TABLET | Freq: Three times a day (TID) | ORAL | 0 refills | Status: DC | PRN

## 2021-07-24 MED ORDER — LIDOCAINE HCL (PF) 2 % IJ SOLN
INTRAMUSCULAR | Status: AC
  Filled 2021-07-24: qty 5

## 2021-07-24 MED ORDER — OXYCODONE HCL 5 MG PO TABS
ORAL_TABLET | ORAL | Status: AC
  Filled 2021-07-24: qty 1

## 2021-07-24 MED ORDER — DEXAMETHASONE SODIUM PHOSPHATE 10 MG/ML IJ SOLN
INTRAMUSCULAR | Status: AC
  Filled 2021-07-24: qty 1

## 2021-07-24 MED ORDER — OXYCODONE HCL 5 MG/5ML PO SOLN: 5.0000 mg | Freq: Once | ORAL | Status: AC | PRN

## 2021-07-24 MED ORDER — DROPERIDOL 2.5 MG/ML IJ SOLN: 0.6250 mg | Freq: Once | INTRAMUSCULAR | Status: DC | PRN

## 2021-07-24 MED ORDER — SUCCINYLCHOLINE CHLORIDE 200 MG/10ML IV SOSY
PREFILLED_SYRINGE | INTRAVENOUS | Status: DC | PRN
  Administered 2021-07-24: 160 mg via INTRAVENOUS

## 2021-07-24 MED ORDER — FENTANYL CITRATE (PF) 100 MCG/2ML IJ SOLN
INTRAMUSCULAR | Status: DC | PRN
  Administered 2021-07-24 (×2): 50 ug via INTRAVENOUS

## 2021-07-24 MED ORDER — MIDAZOLAM HCL 2 MG/2ML IJ SOLN
1.0000 mg | INTRAMUSCULAR | Status: AC | PRN
  Administered 2021-07-24: 1 mg via INTRAVENOUS
  Administered 2021-07-24: 2 mg via INTRAVENOUS
  Administered 2021-07-24: 1 mg via INTRAVENOUS

## 2021-07-24 MED ORDER — ACETAMINOPHEN 10 MG/ML IV SOLN
INTRAVENOUS | Status: AC
  Filled 2021-07-24: qty 100

## 2021-07-24 MED ORDER — FENTANYL CITRATE (PF) 100 MCG/2ML IJ SOLN
INTRAMUSCULAR | Status: AC
  Filled 2021-07-24: qty 2

## 2021-07-24 MED ORDER — OXYCODONE HCL 5 MG PO TABS: 5.0000 mg | ORAL_TABLET | ORAL | 0 refills | Status: DC | PRN

## 2021-07-24 MED ORDER — LIDOCAINE HCL (PF) 1 % IJ SOLN
INTRAMUSCULAR | Status: AC
  Filled 2021-07-24: qty 2

## 2021-07-24 MED ORDER — PROMETHAZINE HCL 25 MG/ML IJ SOLN: 6.2500 mg | INTRAMUSCULAR | Status: DC | PRN

## 2021-07-24 MED ORDER — ONDANSETRON HCL 4 MG/2ML IJ SOLN
INTRAMUSCULAR | Status: DC | PRN
  Administered 2021-07-24: 4 mg via INTRAVENOUS

## 2021-07-24 MED ORDER — OXYCODONE HCL 5 MG PO TABS
5.0000 mg | ORAL_TABLET | Freq: Once | ORAL | Status: AC | PRN
  Administered 2021-07-24: 5 mg via ORAL

## 2021-07-24 MED ORDER — BUPIVACAINE HCL (PF) 0.5 % IJ SOLN
INTRAMUSCULAR | Status: AC
  Filled 2021-07-24: qty 30

## 2021-07-24 MED ORDER — ONDANSETRON HCL 4 MG/2ML IJ SOLN
INTRAMUSCULAR | Status: AC
  Filled 2021-07-24: qty 2

## 2021-07-24 MED ORDER — PROPOFOL 10 MG/ML IV BOLUS
INTRAVENOUS | Status: DC | PRN
  Administered 2021-07-24: 200 mg via INTRAVENOUS

## 2021-07-24 MED ORDER — PHENYLEPHRINE 80 MCG/ML (10ML) SYRINGE FOR IV PUSH (FOR BLOOD PRESSURE SUPPORT)
PREFILLED_SYRINGE | INTRAVENOUS | Status: DC | PRN
  Administered 2021-07-24: 80 ug via INTRAVENOUS
  Administered 2021-07-24: 40 ug via INTRAVENOUS
  Administered 2021-07-24 (×3): 80 ug via INTRAVENOUS

## 2021-07-24 MED ORDER — FAMOTIDINE 20 MG PO TABS
ORAL_TABLET | ORAL | Status: AC
  Administered 2021-07-24: 20 mg via ORAL
  Filled 2021-07-24: qty 1

## 2021-07-24 MED ORDER — DEXAMETHASONE SODIUM PHOSPHATE 10 MG/ML IJ SOLN
INTRAMUSCULAR | Status: DC | PRN
  Administered 2021-07-24: 10 mg via INTRAVENOUS

## 2021-07-24 MED ORDER — MIDAZOLAM HCL 2 MG/2ML IJ SOLN
INTRAMUSCULAR | Status: AC
  Filled 2021-07-24: qty 2

## 2021-07-24 MED ORDER — LACTATED RINGERS IV SOLN: INTRAVENOUS | Status: DC

## 2021-07-24 MED ORDER — BUPIVACAINE HCL (PF) 0.5 % IJ SOLN
INTRAMUSCULAR | Status: DC | PRN
  Administered 2021-07-24 (×2): 15 mL via PERINEURAL

## 2021-07-24 MED ORDER — ASPIRIN 325 MG PO TBEC: 325.0000 mg | DELAYED_RELEASE_TABLET | Freq: Every day | ORAL | 0 refills | Status: AC

## 2021-07-24 MED ORDER — FENTANYL CITRATE PF 50 MCG/ML IJ SOSY: 50.0000 ug | PREFILLED_SYRINGE | Freq: Once | INTRAMUSCULAR | Status: DC

## 2021-07-24 MED ORDER — LACTATED RINGERS IV SOLN
INTRAVENOUS | Status: DC | PRN
  Administered 2021-07-24: 3000 mL

## 2021-07-24 MED ORDER — IBUPROFEN 800 MG PO TABS: 800.0000 mg | ORAL_TABLET | Freq: Three times a day (TID) | ORAL | 0 refills | Status: AC

## 2021-07-24 MED ORDER — CHLORHEXIDINE GLUCONATE 0.12 % MT SOLN: 15.0000 mL | Freq: Once | OROMUCOSAL | Status: AC

## 2021-07-24 MED ORDER — KETOROLAC TROMETHAMINE 15 MG/ML IJ SOLN: 15.0000 mg | Freq: Once | INTRAMUSCULAR | Status: AC

## 2021-07-24 MED ORDER — EPHEDRINE SULFATE (PRESSORS) 50 MG/ML IJ SOLN
INTRAMUSCULAR | Status: DC | PRN
  Administered 2021-07-24 (×2): 5 mg via INTRAVENOUS

## 2021-07-24 MED ORDER — PROPOFOL 10 MG/ML IV BOLUS
INTRAVENOUS | Status: AC
  Filled 2021-07-24: qty 20

## 2021-07-24 MED ORDER — FENTANYL CITRATE (PF) 100 MCG/2ML IJ SOLN
25.0000 ug | INTRAMUSCULAR | Status: DC | PRN
  Administered 2021-07-24 (×4): 25 ug via INTRAVENOUS

## 2021-07-24 MED ORDER — OXYCODONE HCL 5 MG PO TABS: 5.0000 mg | ORAL_TABLET | Freq: Once | ORAL | Status: AC

## 2021-07-24 MED ORDER — ACETAMINOPHEN 500 MG PO TABS: 1000.0000 mg | ORAL_TABLET | Freq: Three times a day (TID) | ORAL | 2 refills | Status: AC

## 2021-07-24 MED ORDER — ACETAMINOPHEN 10 MG/ML IV SOLN
INTRAVENOUS | Status: DC | PRN
  Administered 2021-07-24: 1000 mg via INTRAVENOUS

## 2021-07-24 MED ORDER — LIDOCAINE HCL (CARDIAC) PF 100 MG/5ML IV SOSY
PREFILLED_SYRINGE | INTRAVENOUS | Status: DC | PRN
  Administered 2021-07-24: 100 mg via INTRAVENOUS

## 2021-07-24 MED ORDER — KETOROLAC TROMETHAMINE 15 MG/ML IJ SOLN
INTRAMUSCULAR | Status: AC
  Administered 2021-07-24: 15 mg via INTRAVENOUS
  Filled 2021-07-24: qty 1

## 2021-07-24 MED ORDER — FAMOTIDINE 20 MG PO TABS: 20.0000 mg | ORAL_TABLET | Freq: Once | ORAL | Status: AC

## 2021-07-24 MED ORDER — LIDOCAINE-EPINEPHRINE 1 %-1:100000 IJ SOLN
INTRAMUSCULAR | Status: AC
  Filled 2021-07-24: qty 1

## 2021-07-24 MED ORDER — ORAL CARE MOUTH RINSE: 15.0000 mL | Freq: Once | OROMUCOSAL | Status: AC

## 2021-07-24 MED ORDER — OXYCODONE HCL 5 MG PO TABS
ORAL_TABLET | ORAL | Status: AC
  Administered 2021-07-24: 5 mg via ORAL
  Filled 2021-07-24: qty 1

## 2021-07-24 MED ORDER — CHLORHEXIDINE GLUCONATE 0.12 % MT SOLN
OROMUCOSAL | Status: AC
  Administered 2021-07-24: 15 mL via OROMUCOSAL
  Filled 2021-07-24: qty 15

## 2021-07-24 MED ORDER — ACETAMINOPHEN 10 MG/ML IV SOLN: 1000.0000 mg | Freq: Once | INTRAVENOUS | Status: DC | PRN

## 2021-07-24 MED ORDER — PHENYLEPHRINE 80 MCG/ML (10ML) SYRINGE FOR IV PUSH (FOR BLOOD PRESSURE SUPPORT)
PREFILLED_SYRINGE | INTRAVENOUS | Status: AC
  Filled 2021-07-24: qty 10

## 2021-07-24 MED ORDER — RINGERS IRRIGATION IR SOLN
Status: DC | PRN
  Administered 2021-07-24: 9000 mL

## 2021-07-24 MED ORDER — CEFAZOLIN IN SODIUM CHLORIDE 3-0.9 GM/100ML-% IV SOLN
3.0000 g | INTRAVENOUS | Status: AC
  Administered 2021-07-24: 3 g via INTRAVENOUS
  Filled 2021-07-24: qty 100

## 2021-07-24 SURGICAL SUPPLY — 52 items
ADAPTER IRRIG TUBE 2 SPIKE SOL (ADAPTER) ×4 IMPLANT
ADPR TBG 2 SPK PMP STRL ASCP (ADAPTER) ×2
APL PRP STRL LF DISP 70% ISPRP (MISCELLANEOUS) ×1
BLADE FULL RADIUS 3.5 (BLADE) IMPLANT
BLADE SHAVER 4.5X7 STR FR (MISCELLANEOUS) ×2 IMPLANT
BLADE SURG SZ11 CARB STEEL (BLADE) ×2 IMPLANT
BNDG CMPR 5X6 CHSV STRCH STRL (GAUZE/BANDAGES/DRESSINGS) ×1
BNDG COHESIVE 6X5 TAN ST LF (GAUZE/BANDAGES/DRESSINGS) ×2 IMPLANT
BNDG ELASTIC 6X5.8 VLCR STR LF (GAUZE/BANDAGES/DRESSINGS) ×2 IMPLANT
BNDG ESMARK 6X12 TAN STRL LF (GAUZE/BANDAGES/DRESSINGS) ×2 IMPLANT
BUR BR 5.5 WIDE MOUTH (BURR) IMPLANT
CAST PADDING 6X4YD ST 30248 (SOFTGOODS) ×2
CHLORAPREP W/TINT 26 (MISCELLANEOUS) ×2 IMPLANT
COOLER POLAR GLACIER W/PUMP (MISCELLANEOUS) ×2 IMPLANT
COVER LIGHT HANDLE STERIS (MISCELLANEOUS) ×4 IMPLANT
DRAPE ARTHRO LIMB 89X125 STRL (DRAPES) ×4 IMPLANT
DRAPE IMP U-DRAPE 54X76 (DRAPES) ×2 IMPLANT
ELECT REM PT RETURN 9FT ADLT (ELECTROSURGICAL)
ELECTRODE REM PT RTRN 9FT ADLT (ELECTROSURGICAL) IMPLANT
GAUZE SPONGE 4X4 12PLY STRL (GAUZE/BANDAGES/DRESSINGS) ×2 IMPLANT
GLOVE BIOGEL PI IND STRL 8 (GLOVE) ×1 IMPLANT
GLOVE BIOGEL PI INDICATOR 8 (GLOVE) ×2
GLOVE ORTHO TXT STRL SZ7.5 (GLOVE) ×2 IMPLANT
GLOVE SURG ORTHO 8.0 STRL STRW (GLOVE) ×4 IMPLANT
GOWN STRL REUS W/ TWL LRG LVL3 (GOWN DISPOSABLE) ×1 IMPLANT
GOWN STRL REUS W/ TWL XL LVL3 (GOWN DISPOSABLE) ×2 IMPLANT
GOWN STRL REUS W/TWL LRG LVL3 (GOWN DISPOSABLE) ×2
GOWN STRL REUS W/TWL XL LVL3 (GOWN DISPOSABLE) ×4
IMPL SYS MENISCAL ROOT REPAIR (Orthopedic Implant) ×2 IMPLANT
IV LACTATED RINGER IRRG 3000ML (IV SOLUTION) ×8
IV LR IRRIG 3000ML ARTHROMATIC (IV SOLUTION) ×4 IMPLANT
KIT TURNOVER KIT A (KITS) ×2 IMPLANT
MANIFOLD NEPTUNE II (INSTRUMENTS) ×4 IMPLANT
MAT ABSORB  FLUID 56X50 GRAY (MISCELLANEOUS) ×4
MAT ABSORB FLUID 56X50 GRAY (MISCELLANEOUS) ×2 IMPLANT
PACK ARTHROSCOPY KNEE (MISCELLANEOUS) ×2 IMPLANT
PAD ABD DERMACEA PRESS 5X9 (GAUZE/BANDAGES/DRESSINGS) ×4 IMPLANT
PAD WRAPON POLAR KNEE LONG (MISCELLANEOUS) IMPLANT
PADDING CAST COTTON 6X4 ST (SOFTGOODS) ×1 IMPLANT
PENCIL ELECTRO HAND CTR (MISCELLANEOUS) IMPLANT
SUT ETHILON 3-0 FS-10 30 BLK (SUTURE) ×2
SUT MNCRL 4-0 (SUTURE) ×2
SUT MNCRL 4-0 27XMFL (SUTURE) ×1
SUT VIC AB 2-0 CT2 27 (SUTURE) ×2 IMPLANT
SUTURE EHLN 3-0 FS-10 30 BLK (SUTURE) ×1 IMPLANT
SUTURE MNCRL 4-0 27XMF (SUTURE) ×1 IMPLANT
TOWEL OR 17X26 4PK STRL BLUE (TOWEL DISPOSABLE) ×4 IMPLANT
TUBING INFLOW SET DBFLO PUMP (TUBING) ×2 IMPLANT
TUBING OUTFLOW SET DBLFO PUMP (TUBING) ×2 IMPLANT
WAND WEREWOLF FLOW 90D (MISCELLANEOUS) ×1 IMPLANT
WATER STERILE IRR 500ML POUR (IV SOLUTION) ×2 IMPLANT
WRAPON POLAR PAD KNEE LONG (MISCELLANEOUS) ×2

## 2021-07-24 NOTE — Anesthesia Procedure Notes (Signed)
Procedure Name: Intubation Date/Time: 07/24/2021 7:59 AM  Performed by: Cammie Sickle, CRNAPre-anesthesia Checklist: Patient identified, Patient being monitored, Timeout performed, Emergency Drugs available and Suction available Patient Re-evaluated:Patient Re-evaluated prior to induction Oxygen Delivery Method: Circle system utilized Preoxygenation: Pre-oxygenation with 100% oxygen Induction Type: IV induction Ventilation: Two handed mask ventilation required and Oral airway inserted - appropriate to patient size Laryngoscope Size: 3 and McGraph Grade View: Grade I Tube type: Oral Tube size: 7.0 mm Number of attempts: 1 Airway Equipment and Method: Stylet Placement Confirmation: ETT inserted through vocal cords under direct vision, positive ETCO2 and breath sounds checked- equal and bilateral Secured at: 22 cm Tube secured with: Tape Dental Injury: Teeth and Oropharynx as per pre-operative assessment

## 2021-07-24 NOTE — Anesthesia Procedure Notes (Signed)
Anesthesia Regional Block: Popliteal block   Pre-Anesthetic Checklist: , timeout performed,  Correct Patient, Correct Site, Correct Laterality,  Correct Procedure, Correct Position, site marked,  Risks and benefits discussed,  Surgical consent,  Pre-op evaluation,  At surgeon's request and post-op pain management  Laterality: Right  Prep: chloraprep       Needles:  Injection technique: Single-shot  Needle Type: Stimiplex     Needle Length: 9cm  Needle Gauge: 22     Additional Needles:   Procedures:,,,, ultrasound used (permanent image in chart),,    Narrative:  Start time: 07/24/2021 7:26 AM End time: 07/24/2021 7:28 AM Injection made incrementally with aspirations every 5 mL.  Performed by: Personally  Anesthesiologist: Iran Ouch, MD  Additional Notes: Patient consented for risk and benefits of nerve block including but not limited to nerve damage, failed block, bleeding and infection.  Patient voiced understanding.  Functioning IV was confirmed and monitors were applied.  Timeout done prior to procedure and prior to any sedation being given to the patient.  Patient confirmed procedure site prior to any sedation given to the patient. Sterile prep,hand hygiene and sterile gloves were used.  Minimal sedation used for procedure.  No paresthesia endorsed by patient during the procedure.  Negative aspiration and negative test dose prior to incremental administration of local anesthetic. The patient tolerated the procedure well with no immediate complications.

## 2021-07-24 NOTE — Transfer of Care (Signed)
Immediate Anesthesia Transfer of Care Note  Patient: Barbara Pennington  Procedure(s) Performed: Right medial meniscus root repair and patella chondroplasty (Right: Knee)  Patient Location: PACU  Anesthesia Type:General  Level of Consciousness: drowsy  Airway & Oxygen Therapy: Patient Spontanous Breathing and Patient connected to face mask oxygen  Post-op Assessment: Report given to RN and Post -op Vital signs reviewed and stable  Post vital signs: Reviewed and stable  Last Vitals:  Vitals Value Taken Time  BP 119/69 07/24/21 0936  Temp 36.7 C 07/24/21 0936  Pulse 81 07/24/21 0939  Resp 16 07/24/21 0939  SpO2 93 % 07/24/21 0939  Vitals shown include unvalidated device data.  Last Pain:  Vitals:   07/24/21 0640  TempSrc: Temporal  PainSc: 4          Complications: No notable events documented.

## 2021-07-24 NOTE — Anesthesia Procedure Notes (Signed)
Anesthesia Regional Block: Adductor canal block   Pre-Anesthetic Checklist: , timeout performed,  Correct Patient, Correct Site, Correct Laterality,  Correct Procedure, Correct Position, site marked,  Risks and benefits discussed,  Surgical consent,  Pre-op evaluation,  At surgeon's request and post-op pain management  Laterality: Upper and Right  Prep: chloraprep       Needles:  Injection technique: Single-shot  Needle Type: Stimiplex     Needle Length: 9cm  Needle Gauge: 22     Additional Needles:   Procedures:,,,, ultrasound used (permanent image in chart),,    Narrative:  Start time: 07/24/2021 7:25 AM End time: 07/24/2021 7:28 AM Injection made incrementally with aspirations every 5 mL.  Performed by: Personally  Anesthesiologist: Iran Ouch, MD  Additional Notes: Patient consented for risk and benefits of nerve block including but not limited to nerve damage, failed block, bleeding and infection.  Patient voiced understanding.  Functioning IV was confirmed and monitors were applied.  Timeout done prior to procedure and prior to any sedation being given to the patient.  Patient confirmed procedure site prior to any sedation given to the patient. Sterile prep,hand hygiene and sterile gloves were used.  Minimal sedation used for procedure.  No paresthesia endorsed by patient during the procedure.  Negative aspiration and negative test dose prior to incremental administration of local anesthetic. The patient tolerated the procedure well with no immediate complications.

## 2021-07-24 NOTE — H&P (Signed)
Paper H&P to be scanned into permanent record. H&P reviewed. No significant changes noted.  

## 2021-07-24 NOTE — Op Note (Signed)
DATE: 07/24/2021   PRE-OP DIAGNOSIS:  1. Right medial meniscus root tear 2. Right patellofemoral compartment osteoarthritis   POST-OP DIAGNOSIS:  1. Right medial meniscus root tear 2. Right patellofemoral compartment osteoarthritis right  PROCEDURES:  1. Right knee medial meniscus root repair  2. Right patella chondroplasty   SURGEON:  Langston Reusing, MD   ASSISTANT(S):  Reche Dixon, [place]; Sophia Hasham, PA-S    ANESTHESIA: regional adductor canal + popliteal block + general   TOTAL IV FLUIDS: See anesthesia record   ESTIMATED BLOOD LOSS: 10cc   TOURNIQUET TIME:  65 min   DRAINS:  None.   SPECIMENS: None   IMPLANTS:  - Arthrex Biocomposite SwiveLock (x1)     COMPLICATIONS: None   INDICATIONS: Barbara Pennington is a 41 y.o. female with R knee pain that has failed non-operative management.  Pain started approximately 6 weeks ago when she was going up the stairs and felt a sharp pop on the medial aspect of her knee.  Clinical exam and radiographic studies were notable for right knee pain and swelling with instances of giving way with associated popping and catching on the medial aspect of her knee. Additionally, MRI showed a medial meniscus root tear with mild degenerative changes to the patella.  Patient denies any antecedent pain prior to this traumatic incident 6 weeks ago.  Given the poor long-term prognosis of a meniscus root tear and high likelihood of significant progression of osteoarthritis, we elected to proceed with the above procedure after a discussion of the risks, benefits, and alternatives to surgery.    OPERATIVE FINDINGS:    Examination under anesthesia: A careful examination under anesthesia was performed.  Passive range of motion was: Hyperextension: 1.  Extension: 0.  Flexion: 110.  Lachman: normal. Pivot Shift: normal.  Posterior drawer: normal.  Varus stability in full extension: normal.  Varus stability in 30 degrees of flexion: normal.  Valgus stability in  full extension: normal.  Valgus stability in 30 degrees of flexion: normal.   Intra-operative findings: A thorough arthroscopic examination of the knee was performed.  The findings are: 1. Suprapatellar pouch: Normal 2. Undersurface of median ridge: Grade 3 degenerative changes 3. Medial patellar facet: Grade 1 degenerative changes 4. Lateral patellar facet: Grade 1 degenerative changes 5. Trochlea: Grade 1 degenerative changes 6. Lateral gutter/popliteus tendon: Normal 7. Hoffa's fat pad: Inflamed 8. Medial gutter/plica: Normal 9. ACL: Normal 10. PCL: Normal 11. Medial meniscus: Complete tear of the medial meniscus at the posterior root 12. Medial compartment cartilage: Grade 1 changes to the medial tibial plateau and medial femoral condyle 13. Lateral meniscus: Normal 14. Lateral compartment cartilage: Grade 2 changes to tibial plateau; normal lateral femoral condyle  DESCRIPTION OF PROCEDURE: I identified Barbara Pennington in the pre-operative holding area.  I marked the operative knee with my initials. I reviewed the risks and benefits of the proposed surgical intervention and the patient (and/or patient's guardian) wished to proceed. The patient was transferred to the operative suite and placed in the supine position with all bony prominences padded.  Anesthesia was administered. Appropriate IV antibiotics were administered prior to incision. The extremity was then prepped and draped in standard fashion. A time out was performed confirming the correct extremity, correct patient, and correct procedure.   Arthroscopy portals were marked. Local anesthetic was injected to the planned portal sites. The anterolateral portal was established with an 11 blade. The arthroscope was placed in the anterolateral portal and then into the suprapatellar pouch.  A diagnostic knee scope was completed with the above findings. Next the medial portal was established under needle localization.  A chondroplasty of the  patella was performed with an oscillating shaver such that there were stable cartilaginous edges.  The MCL was pie-crusted to improve visualization of the posterior horn of the medial meniscus. The meniscus root tear was identified and probed to confirm our findings. Next, an Arthrex meniscus root aiming guide was used to mark out the tibial incision. An approximately 8cm vertical incision was made medial to the tibial tubercle. This was carried down to the sartorius fascia with bovie electrocautery, and the fascia was incised. An elevator was used to clear periosteum from the tibia in the area of the anticipated bone tunnel. Hemostasis was achieved. The guide was reinserted into the tibia and was placed over the anatomic footprint of the medial meniscus root. We then used a 6.105m FlipCutter to drill into the tibia and create a 615msocket for the meniscus root. A FiberStick was passed through our tibial tunnel and into the joint. This was retrieved and passed through the anterolateral portal.   Next, using a Meniscus Scorpion, an 0-FiberLink suture was passed just medial to the meniscus root in a luggage tag configuration.  A second stitch was passed in a similar fashion medial to the first stitch.  This allowed for excellent purchase of the meniscus root.  We ensured there was no soft tissue bridge between the sutures and pulled the passing stitch from the Fi[place]hrough the anteromedial portal. We then used the passing stitch to bring the sutures in the meniscus out through the tibial tunnel. These were then passed through an [employer] Incnchor. Anchor hole was drilled ~3cm distal previously drilled tibial tunnel. Anchor was inserted with appropriate tension while visualizing the repair with the arthroscope, and this achieved excellent interference fit. The meniscus root was probed and found to be stable and anatomically reduced.   Any loose bony debris was removed from the knee joint with a shaver  and excess fluid was evacuated from the joint. Closure of the portals with 3-0 Nylon was performed. The sartorius fascia was closed 0 vicryl and the Fiberwire from the SwiveLock anchor. The subdermal layer of the tibial incision was closed with 2-0 vicryl and the skin was closed with 4-0 Monocryl in a running fashion and Dermabond.  Xeroform gauze and dry sterile dressings were applied. A PolarCare and hinged knee brace were also applied.   Instrument, sponge, and needle counts were correct prior to wound closure and at the conclusion of the case.   Of note, assistance from a PA was essential to performing the surgery.  PA was present for the entire surgery.  PA assisted with patient positioning, retraction, instrumentation, and wound closure. The surgery would have been more difficult and had longer operative time without PA assistance.   Additionally, this case had increased complexity compared to standard meniscus repair given that the patient had a complete tear of the meniscus root. Repair of this tear involved making a separate open incision, drilling a tunnel through the tibia, and using an implant in the tibia for fixation, all of which would otherwise not occur for standard meniscal repairs.  Additionally, this case had significantly increased complexity compared to standard meniscus repair due to the patient's morbid obesity.  The patient has a BMI of over 59 and weighs approximately 159 kg.  This led to significantly extra time positioning the patient appropriately.  Additionally, the proximal  tibia incision was larger than usual in order to allow for improved visualization.  Combined, surgical time by approximately 30 minutes.  DISPOSITION: PACU - hemodynamically stable.    POSTOPERATIVE PLAN: The patient will be discharged home today.     Non-weight bearing x 4 weeks. 50% WB from weeks 4-6. ASA for DVT ppx x 4 weeks, Narcotic medication, NSAID, and acetaminophen as discussed  pre-operatively. The patient will be attending physical therapy beginning 3-7 days post-op. Physical therapy per Meniscus Root Repair Rehab Guidelines.   Patient to return to clinic 10-14 days postop for suture removal.

## 2021-07-24 NOTE — Discharge Instructions (Addendum)
Arthroscopic Knee Surgery - Meniscus Repair   Post-Op Instructions   1. Bracing or crutches: Crutches will be provided at the time of discharge from the surgery center. Keep brace locked in extension at all times except as directed by physical therapy.    2. Ice: You may be provided with a device [facility]) that allows you to ice the affected area effectively. Otherwise you can ice manually.    3. Driving:  Plan on not driving for at least four weeks. Please note that you are advised NOT to drive while taking narcotic pain medications as you may be impaired and unsafe to drive.   4. Activity: Ankle pumps several times an hour while awake to prevent blood clots. Weight bearing: NO WEIGHT BEARING FOR 4 WEEKS. Use crutches for at least 4 weeks, if not 6 based on your surgery. Bending and straightening the knee is unlimited, but do not flex your knee past 90 degrees until cleared by your therapist. Elevate knee above heart level as much as possible for one week. Avoid standing more than 5 minutes (consecutively) for the first week. No exercise involving the knee until cleared by the surgeon or physical therapist.  Avoid long distance travel for 4 weeks.   5. Medications:  - You have been provided a prescription for narcotic pain medicine. After surgery, take 1-2 narcotic tablets every 4 hours if needed for severe pain. If it has tylenol (acetaminophen), please do not take a total of more than 3016m/day of tylenol.  - A prescription for anti-nausea medication will be provided in case the narcotic medicine causes nausea - take 1 tablet every 6 hours only if nauseated.  - Take ibuprofen 800 mg every 8 hours with food to reduce post-operative knee swelling. DO NOT STOP IBUPROFEN POST-OP UNTIL INSTRUCTED TO DO SO at first post-op office visit (10-14 days after surgery).  - Take enteric coated aspirin 325 mg once daily for 4 weeks to prevent blood clots.  -Take tylenol 1000 every 8 hours for pain.  May  stop tylenol 3 days after surgery or when you are having minimal pain. If your narcotic has tylenol (acetaminophen), please do not take a total of more than 30016mday of tylenol.    If you are taking prescription medication for anxiety, depression, insomnia, muscle spasm, chronic pain, or for attention deficit disorder you are advised that you are at a higher risk of adverse effects with use of narcotics post-op, including narcotic addiction/dependence, depressed breathing, death. If you use non-prescribed substances: alcohol, marijuana, cocaine, heroin, methamphetamines, etc., you are at a higher risk of adverse effects with use of narcotics post-op, including narcotic addiction/dependence, depressed breathing, death. You are advised that taking > 50 morphine milligram equivalents (MME) of narcotic pain medication per day results in twice the risk of overdose or death. For your prescription provided: oxycodone 5 mg - taking more than 6 tablets per day. Be advised that we will prescribe narcotics short-term, for acute post-operative pain only - 1 week for minor operations such as knee arthroscopy for meniscus tear resection, and 3 weeks for major operations such as knee repair/reconstruction surgeries.   6. Bandages: The physical therapist should change the bandages at the first post-op appointment. If needed, the dressing supplies have been provided to you. You may shower after this with waterproof bandaids covering the incisions.    7. Physical Therapy: 2 times per week for the first 4 weeks, then 1-2 times per week from weeks 4-8 post-op. Therapy typically  starts on post operative Day 3 or 4. You have been provided an order for physical therapy. The therapist will provide home exercises.   8. Work: May return to full work when off of crutches. May do light duty/desk job in approximately 1-2 weeks when off of narcotics, pain is well-controlled, and swelling has decreased.   9. Post-Op  Appointments: Your first post-op appointment will be with Dr. Posey Pronto in approximately 2 weeks time.    If you find that they have not been scheduled please call the Orthopaedic Appointment front desk at [phone].   AMBULATORY SURGERY  DISCHARGE INSTRUCTIONS   The drugs that you were given will stay in your system until tomorrow so for the next 24 hours you should not:  Drive an automobile Make any legal decisions Drink any alcoholic beverage   You may resume regular meals tomorrow.  Today it is better to start with liquids and gradually work up to solid foods.  You may eat anything you prefer, but it is better to start with liquids, then soup and crackers, and gradually work up to solid foods.   Please notify your doctor immediately if you have any unusual bleeding, trouble breathing, redness and pain at the surgery site, drainage, fever, or pain not relieved by medication.    Additional Instructions: Please contact your physician with any problems or Same Day Surgery at [phone], Monday through Friday 6 am to 4 pm, or [place] at [facility] number at [phone].

## 2021-07-24 NOTE — Anesthesia Preprocedure Evaluation (Addendum)
Anesthesia Evaluation  Patient identified by MRN, date of birth, ID band Patient awake    Reviewed: Allergy & Precautions, NPO status , Patient's Chart, lab work & pertinent test results  History of Anesthesia Complications (+) history of anesthetic complications  Airway Mallampati: IV       Dental  (+) Teeth Intact, Dental Advidsory Given   Pulmonary neg shortness of breath, asthma , sleep apnea and Continuous Positive Airway Pressure Ventilation , neg COPD, neg recent URI, former smoker,     + decreased breath sounds      Cardiovascular hypertension, Pt. on medications (-) angina(-) Past MI and (-) Cardiac Stents (-) dysrhythmias (-) Valvular Problems/Murmurs Rhythm:Regular Rate:Normal     Neuro/Psych PSYCHIATRIC DISORDERS Anxiety negative neurological ROS     GI/Hepatic Neg liver ROS, GERD  Controlled,  Endo/Other  neg diabetesMorbid obesity  Renal/GU CRFRenal disease  negative genitourinary   Musculoskeletal  (+) Arthritis , Rheumatoid disorders,    Abdominal (+) + obese,   Peds negative pediatric ROS (+)  Hematology   Anesthesia Other Findings Past Medical History: No date: Anxiety 2006: Arthritis     Comment:  rhuematoid - mild - no current issues 2003: Asthma No date: Complication of anesthesia     Comment:  ran fever after c-section - had infection.  "Usually"               has low temp after surgery. 2013: Hypertension 2013: Lump or mass in breast     Comment:  RIGHT BREAST No date: Motion sickness     Comment:  repeated amusement park rides No date: Scoliosis     Comment:  no current issues No date: Shortness of breath dyspnea     Comment:  secondary to weight  2001: Ulcer   Reproductive/Obstetrics                            Anesthesia Physical  Anesthesia Plan  ASA: 3  Anesthesia Plan: General   Post-op Pain Management: Regional block*, Toradol IV (intra-op)*  and Ofirmev IV (intra-op)*   Induction: Intravenous  PONV Risk Score and Plan: 3 and Ondansetron, Dexamethasone and Midazolam  Airway Management Planned: Oral ETT  Additional Equipment:   Intra-op Plan:   Post-operative Plan: Extubation in OR  Informed Consent: I have reviewed the patients History and Physical, chart, labs and discussed the procedure including the risks, benefits and alternatives for the proposed anesthesia with the patient or authorized representative who has indicated his/her understanding and acceptance.       Plan Discussed with: Surgeon  Anesthesia Plan Comments:        Anesthesia Quick Evaluation

## 2021-07-25 ENCOUNTER — Encounter: Payer: Self-pay | Admitting: Orthopedic Surgery

## 2021-07-25 NOTE — Anesthesia Postprocedure Evaluation (Addendum)
Anesthesia Post Note  Patient: Barbara Pennington  Procedure(s) Performed: Right medial meniscus root repair and patella chondroplasty (Right: Knee)  Patient location during evaluation: PACU Anesthesia Type: General Level of consciousness: awake and alert Pain management: pain level controlled Vital Signs Assessment: post-procedure vital signs reviewed and stable Respiratory status: spontaneous breathing, nonlabored ventilation and respiratory function stable Cardiovascular status: blood pressure returned to baseline and stable Postop Assessment: no apparent nausea or vomiting Anesthetic complications: no   No notable events documented.   Last Vitals:  Vitals:   07/24/21 1100 07/24/21 1120  BP: 127/71 121/77  Pulse: 81 81  Resp: 12 16  Temp: 36.9 C 36.8 C  SpO2: 96% 95%    Last Pain:  Vitals:   07/24/21 1120  TempSrc: Oral  PainSc: [place]

## 2021-08-17 ENCOUNTER — Other Ambulatory Visit: Payer: Self-pay | Admitting: Family Medicine

## 2021-08-17 DIAGNOSIS — J4530 Mild persistent asthma, uncomplicated: Secondary | ICD-10-CM

## 2021-08-17 NOTE — Telephone Encounter (Signed)
She has an appt for 9.7.2023

## 2021-08-25 ENCOUNTER — Encounter: Payer: Self-pay | Admitting: Orthopedic Surgery

## 2021-08-25 ENCOUNTER — Other Ambulatory Visit: Payer: Self-pay | Admitting: Family Medicine

## 2021-08-25 DIAGNOSIS — J4530 Mild persistent asthma, uncomplicated: Secondary | ICD-10-CM

## 2021-08-25 NOTE — Telephone Encounter (Signed)
Requested Prescriptions  Pending Prescriptions Disp Refills  . albuterol (VENTOLIN HFA) 108 (90 Base) MCG/ACT inhaler [Pharmacy Med Name: Albuterol Sulfate HFA 108 (90 Base) MCG/ACT Inhalation Aerosol Solution] 9 g 0    Sig: INHALE 2 PUFFS BY MOUTH EVERY 6 HOURS AS NEEDED FOR WHEEZING AND FOR SHORTNESS OF BREATH     Pulmonology:  Beta [facility]onists 2 Passed - 08/25/2021  5:25 PM      Passed - Last BP in normal range    BP Readings from Last 1 Encounters:  07/24/21 121/77         Passed - Last Heart Rate in normal range    Pulse Readings from Last 1 Encounters:  07/24/21 81         Passed - Valid encounter within last 12 months    Recent Outpatient Visits          2 months ago Closed nondisplaced longitudinal fracture of right patella, initial encounter   [facility] [place], Earley Abide, MD   2 months ago Primary osteoarthritis of left knee   [facility] [place], Earley Abide, MD   3 months ago Morbid obesity with BMI of 50.0-59.9, adult [facility])   [facility] Steele Sizer, MD   4 months ago Internal derangement of left knee   [facility] [place], MD   5 months ago Acute pain of left knee   [facility] Teodora Medici, DO      Future Appointments            In 2 weeks Steele Sizer, MD [facility], [facility]

## 2021-08-29 ENCOUNTER — Ambulatory Visit: Payer: PRIVATE HEALTH INSURANCE | Admitting: Family Medicine

## 2021-09-01 ENCOUNTER — Telehealth: Payer: Self-pay | Admitting: Psychiatry

## 2021-09-01 DIAGNOSIS — F4310 Post-traumatic stress disorder, unspecified: Secondary | ICD-10-CM

## 2021-09-01 DIAGNOSIS — F411 Generalized anxiety disorder: Secondary | ICD-10-CM

## 2021-09-01 NOTE — Telephone Encounter (Signed)
I have sent BuSpar to pharmacy.  However I do not see an appointment scheduled for this patient.  Will have staff contact patient to schedule the appointment.

## 2021-09-05 ENCOUNTER — Encounter: Payer: Self-pay | Admitting: Psychiatry

## 2021-09-05 ENCOUNTER — Telehealth (INDEPENDENT_AMBULATORY_CARE_PROVIDER_SITE_OTHER): Payer: PRIVATE HEALTH INSURANCE | Admitting: Psychiatry

## 2021-09-05 DIAGNOSIS — F4310 Post-traumatic stress disorder, unspecified: Secondary | ICD-10-CM

## 2021-09-05 DIAGNOSIS — F4321 Adjustment disorder with depressed mood: Secondary | ICD-10-CM | POA: Diagnosis not present

## 2021-09-05 DIAGNOSIS — Z8659 Personal history of other mental and behavioral disorders: Secondary | ICD-10-CM

## 2021-09-05 DIAGNOSIS — F411 Generalized anxiety disorder: Secondary | ICD-10-CM | POA: Diagnosis not present

## 2021-09-05 DIAGNOSIS — F1011 Alcohol abuse, in remission: Secondary | ICD-10-CM

## 2021-09-05 MED ORDER — ESCITALOPRAM OXALATE 20 MG PO TABS: 20.0000 mg | ORAL_TABLET | Freq: Every day | ORAL | 0 refills | Status: DC

## 2021-09-05 MED ORDER — BUPROPION HCL ER (XL) 300 MG PO TB24: 300.0000 mg | ORAL_TABLET | Freq: Every morning | ORAL | 0 refills | Status: DC

## 2021-09-05 MED ORDER — GABAPENTIN 100 MG PO CAPS: 100.0000 mg | ORAL_CAPSULE | Freq: Two times a day (BID) | ORAL | 1 refills | Status: DC

## 2021-09-05 NOTE — Progress Notes (Unsigned)
Virtual Visit via Video Note  I connected with Barbara Pennington on 09/05/21 at  4:10 PM EDT by a video enabled telemedicine application and verified that I am speaking with the correct person using two identifiers.  Location Provider Location : [facility] Patient Location : Home  Participants: Patient , Provider    I discussed the limitations of evaluation and management by telemedicine and the availability of in person appointments. The patient expressed understanding and agreed to proceed.   I discussed the assessment and treatment plan with the patient. The patient was provided an opportunity to ask questions and all were answered. The patient agreed with the plan and demonstrated an understanding of the instructions.   The patient was advised to call back or seek an in-person evaluation if the symptoms worsen or if the condition fails to improve as anticipated.   [facility] MD OP Progress Note  09/05/2021 5:05 PM Barbara Pennington  MRN:  [id number]  Chief Complaint:  Chief Complaint  Patient presents with   Follow-up 41 year old Caucasian female with history of PTSD, GAD, recent knee injury, presented for medication management.   HPI: Barbara Pennington is a 41 year old Caucasian female, divorced, currently lives in [place], has a history of PTSD, anxiety, borderline personality disorder, morbid obesity, OSA on CPAP, hypertension/chronic kidney disease, asthma, chronic back pain, right-sided knee knee injury after a fall currently status post surgery, presented for medication management.  Patient today reports she had recent surgery, for right-sided medial meniscus tear, currently on crutches, continues to be in pain.  Patient reports she hence has been having trouble sleeping.  She sleeps on the couch since it is more comfortable and convenient.  She however reports she feels her sleep is disrupted throughout the night and some nights she does not sleep and sleeps during the day, the next day and so  on.  Patient also reports having irritability, anger issues on and off.  Likely due to pain, lack of sleep.  Does report she is anxious about her current situation.  Planning to go back to work on September 11, however not sure.  Denies any significant depression, sadness or hopelessness.  Currently compliant on medications, denies side effects.  Continues to be looking for a therapist, agrees to contact staff at front desk to schedule an appointment with Ms. Christina Hussami.  Patient denies any suicidality, homicidality or perceptual disturbances.  Patient denies any other concerns today.  Visit Diagnosis:    ICD-10-CM   1. PTSD (post-traumatic stress disorder)  F43.10 gabapentin (NEURONTIN) 100 MG capsule    buPROPion (WELLBUTRIN XL) 300 MG 24 hr tablet    2. GAD (generalized anxiety disorder)  F41.1 gabapentin (NEURONTIN) 100 MG capsule    escitalopram (LEXAPRO) 20 MG tablet    buPROPion (WELLBUTRIN XL) 300 MG 24 hr tablet    3. Adjustment disorder with depressed mood  F43.21     4. History of borderline personality disorder  Z86.59 escitalopram (LEXAPRO) 20 MG tablet    buPROPion (WELLBUTRIN XL) 300 MG 24 hr tablet    5. History of alcohol abuse  F10.11       Past Psychiatric History: Reviewed past psychiatric history from progress note on 05/05/2021.  Patient was previously under the care of [facility].  Past Medical History:  Past Medical History:  Diagnosis Date   Anxiety    Arthritis 2006   rhuematoid - mild - no current issues   Asthma 2003   Borderline personality disorder ([place])  Chronic kidney disease    stage 3   Complication of anesthesia    "Usually" has low temp after surgery.After hysterectomy temp was 94   Depression    GERD (gastroesophageal reflux disease)    History of kidney stones    Hyperlipidemia    Hypertension 2013   Lump or mass in breast 2013   RIGHT BREAST   Motion sickness    repeated amusement park rides   PTSD (post-traumatic stress  disorder)    Scoliosis    no current issues   Shortness of breath dyspnea    secondary to weight    Sleep apnea    uses cpap   Ulcer 2001    Past Surgical History:  Procedure Laterality Date   ABDOMINAL HYSTERECTOMY  01/08/2006   BREAST BIOPSY Left 01/09/2011   CORE - FIBROADENOMA VS PHYLLODES   CESAREAN SECTION  01/08/2005   COLONOSCOPY WITH PROPOFOL N/A 10/09/2016   Procedure: COLONOSCOPY WITH PROPOFOL;  Surgeon: Jonathon Bellows, MD;  Location: [facility] ENDOSCOPY;  Service: Gastroenterology;  Laterality: N/A;   COLONOSCOPY WITH PROPOFOL N/A 11/09/2019   Procedure: COLONOSCOPY WITH PROPOFOL;  Surgeon: Lin Landsman, MD;  Location: [facility] ENDOSCOPY;  Service: Gastroenterology;  Laterality: N/A;   ESOPHAGOGASTRODUODENOSCOPY     GUM SURGERY  01/08/2002   KNEE ARTHROSCOPY WITH MEDIAL MENISECTOMY Right 07/24/2021   Procedure: Right medial meniscus root repair and patella chondroplasty;  Surgeon: Leim Fabry, MD;  Location: [facility] ORS;  Service: Orthopedics;  Laterality: Right;   PLANTAR FASCIA RELEASE Left 08/19/2014   Procedure: ENDOSCOPIC PLANTAR FASCIOTOMY RELEASE L WITH TOPAZ;  Surgeon: Albertine Patricia, DPM;  Location: [facility];  Service: Podiatry;  Laterality: Left;  LMA WITH POPLITEAL BLOCK   TONSILLECTOMY  01/08/1982    Family Psychiatric History: Reviewed family psychiatric history from progress note on 05/05/2021.  Family History:  Family History  Problem Relation Age of Onset   Obesity Mother    Diabetes Mother    High Cholesterol Mother    Hypertension Mother    Neuropathy Mother    Depression Mother    Anxiety disorder Mother    Obesity Father    Hypertension Father    High Cholesterol Father    Depression Father    Anxiety disorder Father    Testicular cancer Brother    Mental illness Maternal Aunt    Hypertension Maternal Grandfather    High Cholesterol Maternal Grandfather    Heart disease Maternal Grandfather        has a pacemaker   Stroke Maternal  Grandfather    Breast cancer Maternal Grandmother    Hypertension Maternal Grandmother    Aneurysm Maternal Grandmother    Stroke Maternal Grandmother    Colon cancer Paternal Grandfather    Alzheimer's disease Paternal Grandmother    Dementia Paternal Grandmother    Suicidality Cousin     Social History: Reviewed social history from progress note on 05/05/2021. Social History   Socioeconomic History   Marital status: Divorced    Spouse name: Not on file   Number of children: 2   Years of education: 14   Highest education level: Bachelor's degree (e.g., BA, AB, BS)  Occupational History   Not on file  Tobacco Use   Smoking status: Former    Packs/day: 0.25    Years: 10.00    Total pack years: 2.50    Types: Cigarettes    Quit date: 07/05/2014    Years since quitting: 7.1   Smokeless tobacco: Never  Vaping Use   Vaping Use: Never used  Substance and Sexual Activity   Alcohol use: Yes    Comment: rare   Drug use: No   Sexual activity: Yes    Partners: Male    Birth control/protection: Surgical    Comment: 2007  Other Topics Concern   Not on file  Social History Narrative   Not on file   Social Determinants of Health   Financial Resource Strain: High Risk (05/31/2020)   Overall Financial Resource Strain (CARDIA)    Difficulty of Paying Living Expenses: Hard  Food Insecurity: No Food Insecurity (05/31/2020)   Hunger Vital Sign    Worried About Running Out of Food in the Last Year: Never true    Ran Out of Food in the Last Year: Never true  Transportation Needs: No Transportation Needs (05/31/2020)   PRAPARE - Hydrologist (Medical): No    Lack of Transportation (Non-Medical): No  Physical Activity: Unknown (05/31/2020)   Exercise Vital Sign    Days of Exercise per Week: 0 days    Minutes of Exercise per Session: Not on file  Stress: Stress Concern Present (05/31/2020)   [place]    Feeling of Stress : To some extent  Social Connections: Socially Isolated (05/31/2020)   Social Connection and Isolation Panel [NHANES]    Frequency of Communication with Friends and Family: More than three times a week    Frequency of Social Gatherings with Friends and Family: Never    Attends Religious Services: Never    Marine scientist or Organizations: No    Attends Music therapist: Not on file    Marital Status: Divorced    Allergies:  Allergies  Allergen Reactions   Latex Rash   Metformin And Related     diarrhea    Metabolic Disorder Labs: Lab Results  Component Value Date   HGBA1C 5.3 12/06/2020   MPG 105 12/06/2020   MPG 114 08/06/2019   No results found for: "PROLACTIN" Lab Results  Component Value Date   CHOL 171 12/06/2020   TRIG 87 12/06/2020   HDL 42 (L) 12/06/2020   CHOLHDL 4.1 12/06/2020   VLDL 27 08/06/2019   LDLCALC 111 (H) 12/06/2020   LDLCALC 91 08/06/2019   Lab Results  Component Value Date   TSH 2.59 04/04/2018   TSH 1.65 08/26/2013    Therapeutic Level Labs: No results found for: "LITHIUM" No results found for: "VALPROATE" No results found for: "CBMZ"  Current Medications: Current Outpatient Medications  Medication Sig Dispense Refill   acetaminophen (TYLENOL) 500 MG tablet Take 2 tablets (1,000 mg total) by mouth every 8 (eight) hours. 90 tablet 2   albuterol (VENTOLIN HFA) 108 (90 Base) MCG/ACT inhaler INHALE 2 PUFFS BY MOUTH EVERY 6 HOURS AS NEEDED FOR WHEEZING AND FOR SHORTNESS OF BREATH 9 g 0   amLODipine (NORVASC) 2.5 MG tablet Take 1 tablet (2.5 mg total) by mouth daily. (Patient taking differently: Take 2.5 mg by mouth every morning.) 90 tablet 0   atorvastatin (LIPITOR) 20 MG tablet Take 1 tablet (20 mg total) by mouth daily. (Patient taking differently: Take 20 mg by mouth every morning.) 90 tablet 1   busPIRone (BUSPAR) 15 MG tablet Take 1 tablet by mouth twice daily 180 tablet 0    gabapentin (NEURONTIN) 100 MG capsule Take 1 capsule (100 mg total) by mouth in the morning and at bedtime. [place]  capsule 1   hydrOXYzine (ATARAX) 50 MG tablet Take 1 tablet (50 mg total) by mouth 2 (two) times daily. 180 tablet 0   ibuprofen (ADVIL) 200 MG tablet Take 800 mg by mouth every 6 (six) hours as needed.     montelukast (SINGULAIR) 10 MG tablet TAKE 1 TABLET BY MOUTH AT BEDTIME 30 tablet 0   tiZANidine (ZANAFLEX) 4 MG tablet Take 1 tablet (4 mg total) by mouth at bedtime. 90 tablet 0   valsartan-hydrochlorothiazide (DIOVAN-HCT) 160-12.5 MG tablet Take 1 tablet by mouth daily. (Patient taking differently: Take 1 tablet by mouth every morning.) 90 tablet 0   buPROPion (WELLBUTRIN XL) 300 MG 24 hr tablet Take 1 tablet (300 mg total) by mouth every morning. 90 tablet 0   escitalopram (LEXAPRO) 20 MG tablet Take 1 tablet (20 mg total) by mouth daily. 90 tablet 0   No current facility-administered medications for this visit.     Musculoskeletal: Strength & Muscle Tone:  UTA Gait & Station:  Seated Patient leans: N/A  Psychiatric Specialty Exam: Review of Systems  Musculoskeletal:        Rt.sided knee - S/P surgery , pain  Psychiatric/Behavioral:  Positive for sleep disturbance. The patient is nervous/anxious.   All other systems reviewed and are negative.   There were no vitals taken for this visit.There is no height or weight on file to calculate BMI.  General Appearance: Casual  Eye Contact:  Fair  Speech:  Clear and Coherent  Volume:  Normal  Mood:  Anxious and irritable  Affect:  Congruent  Thought Process:  Goal Directed and Descriptions of Associations: Intact  Orientation:  Full (Time, Place, and Person)  Thought Content: Logical   Suicidal Thoughts:  No  Homicidal Thoughts:  No  Memory:  Immediate;   Fair Recent;   Fair Remote;   Fair  Judgement:  Good  Insight:  Fair  Psychomotor Activity:  Normal  Concentration:  Concentration: Fair and Attention Span: Fair   Recall:  [employer] of Knowledge: Fair  Language: Fair  Akathisia:  No  Handed:  Right  AIMS (if indicated): not done  Assets:  Communication Skills Desire for Improvement Housing Social Support  ADL's:  Intact  Cognition: WNL  Sleep:  Poor   Screenings: AIMS    Flowsheet Row Video Visit from 06/13/2021 in [facility] Office Visit from 05/05/2021 in [facility] Total Score 0 0      GAD-7    [place] Office Visit from 05/30/2021 in [facility] at [place] from 05/26/2021 in [facility] Office Visit from 05/05/2021 in [facility] Office Visit from 04/03/2021 in [place] [facility] at [facility] Video Visit from 08/09/2020 in [facility]  Total GAD-7 Score '11 7 17 7 10      '$ PHQ2-9    Flowsheet Row Video Visit from 09/05/2021 in [facility] Video Visit from 06/13/2021 in [facility] Office Visit from 05/30/2021 in [facility] at [place] from 05/26/2021 in [facility] Office Visit from 05/05/2021 in [facility]  PHQ-2 Total Score 2 0 '2 1 2  '$ PHQ-9 Total Score '8 4 7 7 16      '$ Flowsheet Row Video Visit from 09/05/2021 in [facility] Ad[facility] (Discharged) from 07/24/2021 in  [facility] Video Visit from 06/13/2021 in [facility] RISK CATEGORY Low Risk No Risk No Risk        Assessment and Plan: Barbara Pennington is a 41 year old Caucasian female with history of PTSD, borderline personality disorder, depression, GAD, previously under the care of [facility], presented for medication management.  Patient is currently struggling with  pain, anxiety and sleep problems, will benefit from medication management and psychotherapy sessions.  Plan PTSD-improving Lexapro 20 mg p.o. daily BuSpar 15 mg p.o. twice daily Patient advised to establish care with therapist.  She will contact our staff.  GAD-unstable BuSpar 15 mg p.o. twice daily Lexapro 20 mg p.o. daily Hydroxyzine 50 mg p.o. twice daily Start gabapentin 100 mg p.o. twice daily Referred for CBT.  Adjustment disorder with depressed mood-unstable Referred for CBT Continue Wellbutrin XL 300 mg p.o. daily in the morning Add gabapentin 100 mg p.o. twice daily  History of borderline personality disorder-requested medical records-pending  History of alcohol abuse-patient has been sober since 2017.  Follow-up in clinic in 3 to 4 weeks or sooner if needed.   Collaboration of Care: Collaboration of Care: Referral or follow-up with counselor/therapist AEB encouraged to establish care with therapist.  Patient/Guardian was advised Release of Information must be obtained prior to any record release in order to collaborate their care with an outside provider. Patient/Guardian was advised if they have not already done so to contact the registration department to sign all necessary forms in order for [name] to release information regarding their care.   Consent: Patient/Guardian gives verbal consent for treatment and assignment of benefits for services provided during this visit. Patient/Guardian expressed understanding and agreed to proceed.   This note was generated in part or whole with voice recognition software. Voice recognition is usually quite accurate but there are transcription errors that can and very often do occur. I apologize for any typographical errors that were not detected and corrected.      Ursula Alert, MD 09/06/2021, 8:33 AM

## 2021-09-05 NOTE — Patient Instructions (Signed)
[email or web address]  [email or web address]  [facility]   [facility] - [place] [phone]  [facility] - [phone]   Gabapentin Capsules or Tablets What is this medication? GABAPENTIN (GA ba pen tin) treats nerve pain. It may also be used to prevent and control seizures in people with epilepsy. It works by calming overactive nerves in your body. This medicine may be used for other purposes; ask your health care provider or pharmacist if you have questions. COMMON BRAND NAME(S): Active-PAC with Gabapentin, Orpha Bur, Gralise, Neurontin What should I tell my care team before I take this medication? They need to know if you have any of these conditions: Alcohol or substance use disorder Kidney disease Lung or breathing disease Suicidal thoughts, plans, or attempt; a previous suicide attempt by you or a family member An unusual or allergic reaction to gabapentin, other medications, foods, dyes, or preservatives Pregnant or trying to get pregnant Breast-feeding How should I use this medication? Take this medication by mouth with a glass of water. Follow the directions on the prescription label. You can take it with or without food. If it upsets your stomach, take it with food. Take your medication at regular intervals. Do not take it more often than directed. Do not stop taking except on your care team's advice. If you are directed to break the 600 or 800 mg tablets in half as part of your dose, the extra half tablet should be used for the next dose. If you have not used the extra half tablet within 28 days, it should be thrown away. A special MedGuide will be given to you by the pharmacist with each prescription and refill. Be sure to read this information carefully each time. Talk to your care team about the use of this medication in children. While this medication may be prescribed for children as young as 3 years for selected  conditions, precautions do apply. Overdosage: If you think you have taken too much of this medicine contact a poison control center or emergency room at once. NOTE: This medicine is only for you. Do not share this medicine with others. What if I miss a dose? If you miss a dose, take it as soon as you can. If it is almost time for your next dose, take only that dose. Do not take double or extra doses. What may interact with this medication? Alcohol Antihistamines for allergy, cough, and cold Certain medications for anxiety or sleep Certain medications for depression like amitriptyline, fluoxetine, sertraline Certain medications for seizures like phenobarbital, primidone Certain medications for stomach problems General anesthetics like halothane, isoflurane, methoxyflurane, propofol Local anesthetics like lidocaine, pramoxine, tetracaine Medications that relax muscles for surgery Opioid medications for pain Phenothiazines like chlorpromazine, mesoridazine, prochlorperazine, thioridazine This list may not describe all possible interactions. Give your health care provider a list of all the medicines, herbs, non-prescription drugs, or dietary supplements you use. Also tell them if you smoke, drink alcohol, or use illegal drugs. Some items may interact with your medicine. What should I watch for while using this medication? Visit your care team for regular checks on your progress. You may want to keep a record at home of how you feel your condition is responding to treatment. You may want to share this information with your care team at each visit. You should contact your care team if your seizures get worse or if you have any new types of seizures. Do not  stop taking this medication or any of your seizure medications unless instructed by your care team. Stopping your medication suddenly can increase your seizures or their severity. This medication may cause serious skin reactions. They can happen  weeks to months after starting the medication. Contact your care team right away if you notice fevers or flu-like symptoms with a rash. The rash may be red or purple and then turn into blisters or peeling of the skin. Or, you might notice a red rash with swelling of the face, lips or lymph nodes in your neck or under your arms. Wear a medical identification bracelet or chain if you are taking this medication for seizures. Carry a card that lists all your medications. This medication may affect your coordination, reaction time, or judgment. Do not drive or operate machinery until you know how this medication affects you. Sit up or stand slowly to reduce the risk of dizzy or fainting spells. Drinking alcohol with this medication can increase the risk of these side effects. Your mouth may get dry. Chewing sugarless gum or sucking hard candy, and drinking plenty of water may help. Watch for new or worsening thoughts of suicide or depression. This includes sudden changes in mood, behaviors, or thoughts. These changes can happen at any time but are more common in the beginning of treatment or after a change in dose. Call your care team right away if you experience these thoughts or worsening depression. If you become pregnant while using this medication, you may enroll in the [place] Pregnancy Registry by calling [phone]. This registry collects information about the safety of antiepileptic medication use during pregnancy. What side effects may I notice from receiving this medication? Side effects that you should report to your care team as soon as possible: Allergic reactions or angioedema--skin rash, itching, hives, swelling of the face, eyes, lips, tongue, arms, or legs, trouble swallowing or breathing Rash, fever, and swollen lymph nodes Thoughts of suicide or self harm, worsening mood, feelings of depression Trouble breathing Unusual changes in mood or behavior in children  after use such as difficulty concentrating, hostility, or restlessness Side effects that usually do not require medical attention (report to your care team if they continue or are bothersome): Dizziness Drowsiness Nausea Swelling of ankles, feet, or hands Vomiting This list may not describe all possible side effects. Call your doctor for medical advice about side effects. You may report side effects to FDA at 1-800-FDA-1088. Where should I keep my medication? Keep out of reach of children and pets. Store at room temperature between 15 and 30 degrees C (59 and 86 degrees F). Get rid of any unused medication after the expiration date. This medication may cause accidental overdose and death if taken by other adults, children, or pets. To get rid of medications that are no longer needed or have expired: Take the medication to a medication take-back program. Check with your pharmacy or law enforcement to find a location. If you cannot return the medication, check the label or package insert to see if the medication should be thrown out in the garbage or flushed down the toilet. If you are not sure, ask your care team. If it is safe to put it in the trash, empty the medication out of the container. Mix the medication with cat litter, dirt, coffee grounds, or other unwanted substance. Seal the mixture in a bag or container. Put it in the trash. NOTE: This sheet is a summary. It may not  cover all possible information. If you have questions about this medicine, talk to your doctor, pharmacist, or health care provider.  2023 Elsevier/Gold Standard (2019-12-29 00:00:00)

## 2021-09-13 NOTE — Progress Notes (Signed)
Name: Barbara Pennington   MRN: [id number]    DOB: [birth year]   Date:09/14/2021       Progress Note  Subjective  Chief Complaint  Medication Refill  HPI  Morbid Obesity: her weight was 180 lbs when she graduated HS, when she graduated college/after birth of her first child her weight was up 256 lbs (January 2004 after his birth), she lost weight between 2009-2010 due to severe depression down to around 180 lbs, she gradually started to gain weight again and stay around 220 lbs around 216 lbs, but had plantar fascitis and had surgery so she gained weight after that. In 2019 she injury her back at work and lack of activity also caused more weight gain. Weight in May at Dr. West Pugh office was up to 353 lbs ( that was her highest weight) today is down to 345 lbs.  Her BMI is above 50. She is now getting PT since surgery in July   She tried Rybelsus samples 3 mg and was able to lose 7 lbs, GLP-1 agonist would be very beneficial for her.   She has tried metformin in the past and it caused diarrhea   She is morbidly obese with OSA, OA, GERD, CKI, glucose intolerance and dyslipidemia   She has tried [employer] and [employer] ,Rickard Patience  She is trying to eat more fruit and vegetables, cutting down on portion size , eating earlier   Metabolic syndrome with obesity: responded well to GLP-1 in the past for pre-diabetes.   HTN/ Chronic kidney disease:She states she is following up with Nephrology for monitoring. She is compliant with medication, taking norvasc 2.5 mg and valsartan hctz 160/12.5 mg daily , no side effects   History of right knee surgery: done 07/17, had right meniscal repair and patella chondroplasty. She states she has increase in pain after PT   Chronic back pain: she states standing causes increase of pain, even sitting has to shift positions frequently. She had a stand up desk, but was given to another employee. Aching pain, worse on right lower back, no radiculitis.  Asthma  mild intermittent: she has noticed increase in cough and sob since neighbors smoke cigarettes and pot. We will give her symbicort to use prn  OSA: she has been using CPAP , wakes up with a headache intermittently   Patient Active Problem List   Diagnosis Date Noted   Adjustment disorder with depressed mood 06/13/2021   Closed nondisplaced longitudinal fracture of right patella 06/09/2021   GAD (generalized anxiety disorder) 05/05/2021   Depressive disorder 05/05/2021   History of borderline personality disorder 05/05/2021   History of alcohol abuse 05/05/2021   Internal derangement of left knee 04/03/2021   Primary osteoarthritis of left knee 51/70/0174   Metabolic syndrome 94/49/6759   Impaired fasting glucose 12/06/2020   Lymphedema 12/06/2020   Moderate episode of recurrent major depressive disorder ([place]) 02/08/2020   CKD (chronic kidney disease) stage 3, GFR 30-59 ml/min (HCC) 06/11/2019   PTSD (post-traumatic stress disorder) 01/14/2019   Borderline personality disorder ([place]) 01/14/2019   Anxiety 01/14/2019   Mixed hyperlipidemia 01/14/2019   Morbid obesity with BMI of 50.0-59.9, adult ([place]) 01/14/2019   OSA on CPAP 01/14/2019   Asthma, mild persistent 04/30/2018   Low back pain 08/20/2017   Fibroadenoma of breast, 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   Essential hypertension     Past Surgical History:  Procedure Laterality Date   ABDOMINAL HYSTERECTOMY  01/08/2006   BREAST BIOPSY Left 01/09/2011   CORE -  FIBROADENOMA VS PHYLLODES   CESAREAN SECTION  01/08/2005   COLONOSCOPY WITH PROPOFOL N/A 10/09/2016   Procedure: COLONOSCOPY WITH PROPOFOL;  Surgeon: Jonathon Bellows, MD;  Location: [facility] ENDOSCOPY;  Service: Gastroenterology;  Laterality: N/A;   COLONOSCOPY WITH PROPOFOL N/A 11/09/2019   Procedure: COLONOSCOPY WITH PROPOFOL;  Surgeon: Lin Landsman, MD;  Location: [facility] ENDOSCOPY;  Service: Gastroenterology;  Laterality: N/A;   ESOPHAGOGASTRODUODENOSCOPY     GUM SURGERY  01/08/2002    KNEE ARTHROSCOPY WITH MEDIAL MENISECTOMY Right 07/24/2021   Procedure: Right medial meniscus root repair and patella chondroplasty;  Surgeon: Leim Fabry, MD;  Location: [facility] ORS;  Service: Orthopedics;  Laterality: Right;   PLANTAR FASCIA RELEASE Left 08/19/2014   Procedure: ENDOSCOPIC PLANTAR FASCIOTOMY RELEASE L WITH TOPAZ;  Surgeon: Albertine Patricia, DPM;  Location: [facility];  Service: Podiatry;  Laterality: Left;  LMA WITH POPLITEAL BLOCK   TONSILLECTOMY  01/08/1982    Family History  Problem Relation Age of Onset   Obesity Mother    Diabetes Mother    High Cholesterol Mother    Hypertension Mother    Neuropathy Mother    Depression Mother    Anxiety disorder Mother    Obesity Father    Hypertension Father    High Cholesterol Father    Depression Father    Anxiety disorder Father    Testicular cancer Brother    Mental illness Maternal Aunt    Hypertension Maternal Grandfather    High Cholesterol Maternal Grandfather    Heart disease Maternal Grandfather        has a pacemaker   Stroke Maternal Grandfather    Breast cancer Maternal Grandmother    Hypertension Maternal Grandmother    Aneurysm Maternal Grandmother    Stroke Maternal Grandmother    Colon cancer Paternal Grandfather    Alzheimer's disease Paternal Grandmother    Dementia Paternal Grandmother    Suicidality Cousin     Social History   Tobacco Use   Smoking status: Former    Packs/day: 0.25    Years: 10.00    Total pack years: 2.50    Types: Cigarettes    Quit date: 07/05/2014    Years since quitting: 7.2   Smokeless tobacco: Never  Substance Use Topics   Alcohol use: Yes    Comment: rare     Current Outpatient Medications:    acetaminophen (TYLENOL) 500 MG tablet, Take 2 tablets (1,000 mg total) by mouth every 8 (eight) hours., Disp: 90 tablet, Rfl: 2   albuterol (VENTOLIN HFA) 108 (90 Base) MCG/ACT inhaler, INHALE 2 PUFFS BY MOUTH EVERY 6 HOURS AS NEEDED FOR WHEEZING AND FOR SHORTNESS  OF BREATH, Disp: 9 g, Rfl: 0   amLODipine (NORVASC) 2.5 MG tablet, Take 1 tablet (2.5 mg total) by mouth daily. (Patient taking differently: Take 2.5 mg by mouth every morning.), Disp: 90 tablet, Rfl: 0   atorvastatin (LIPITOR) 20 MG tablet, Take 1 tablet (20 mg total) by mouth daily. (Patient taking differently: Take 20 mg by mouth every morning.), Disp: 90 tablet, Rfl: 1   buPROPion (WELLBUTRIN XL) 300 MG 24 hr tablet, Take 1 tablet (300 mg total) by mouth every morning., Disp: 90 tablet, Rfl: 0   busPIRone (BUSPAR) 15 MG tablet, Take 1 tablet by mouth twice daily, Disp: 180 tablet, Rfl: 0   escitalopram (LEXAPRO) 20 MG tablet, Take 1 tablet (20 mg total) by mouth daily., Disp: 90 tablet, Rfl: 0   gabapentin (NEURONTIN) 100 MG capsule, Take 1 capsule (100  mg total) by mouth in the morning and at bedtime., Disp: 60 capsule, Rfl: 1   hydrOXYzine (ATARAX) 50 MG tablet, Take 1 tablet (50 mg total) by mouth 2 (two) times daily., Disp: 180 tablet, Rfl: 0   ibuprofen (ADVIL) 200 MG tablet, Take 800 mg by mouth every 6 (six) hours as needed., Disp: , Rfl:    montelukast (SINGULAIR) 10 MG tablet, TAKE 1 TABLET BY MOUTH AT BEDTIME, Disp: 30 tablet, Rfl: 0   tiZANidine (ZANAFLEX) 4 MG tablet, Take 1 tablet (4 mg total) by mouth at bedtime., Disp: 90 tablet, Rfl: 0   valsartan-hydrochlorothiazide (DIOVAN-HCT) 160-12.5 MG tablet, Take 1 tablet by mouth daily. (Patient taking differently: Take 1 tablet by mouth every morning.), Disp: 90 tablet, Rfl: 0  Allergies  Allergen Reactions   Latex Rash   Metformin And Related     diarrhea    I personally reviewed active problem list, medication list, allergies, family history, social history, health maintenance with the patient/caregiver today.   ROS  Constitutional: Negative for fever or weight change.  Respiratory: positive for cough and shortness of breath.   Cardiovascular: Negative for chest pain or palpitations.  Gastrointestinal: Negative for  abdominal pain, no bowel changes.  Musculoskeletal: positive  for gait problem or joint swelling.  Skin: Negative for rash.  Neurological: Negative for dizziness, positive for  headache.  No other specific complaints in a complete review of systems (except as listed in HPI above).   Objective  Vitals:   09/14/21 0855  BP: 126/84  Pulse: 76  Resp: 16  SpO2: 97%  Weight: (!) 345 lb (156.5 kg)  Height: '5\' 4"'$  (1.626 m)    Body mass index is 59.22 kg/m.  Physical Exam  Constitutional: Patient appears well-developed and well-nourished. Obese  No distress.  HEENT: head atraumatic, normocephalic, pupils equal and reactive to light, neck supple, Cardiovascular: Normal rate, regular rhythm and normal heart sounds.  No murmur heard. BLE edema, no pitting . Pulmonary/Chest: Effort normal and breath sounds normal. No respiratory distress. Abdominal: Soft.  There is no tenderness. Psychiatric: Patient has a normal mood and affect. behavior is normal. Judgment and thought content normal.   Recent Results (from the past 2160 hour(s))  CBC     Status: None   Collection Time: 07/18/21  3:33 PM  Result Value Ref Range   WBC 9.3 4.0 - 10.5 K/uL   RBC 4.53 3.87 - 5.11 MIL/uL   Hemoglobin 13.6 12.0 - 15.0 g/dL   HCT 41.1 36.0 - 46.0 %   MCV 90.7 80.0 - 100.0 fL   MCH 30.0 26.0 - 34.0 pg   MCHC 33.1 30.0 - 36.0 g/dL   RDW 12.7 11.5 - 15.5 %   Platelets 219 150 - 400 K/uL   nRBC 0.0 0.0 - 0.2 %    Comment: Performed at [facility], [address], [place], [place] [address]  Basic metabolic panel     Status: Abnormal   Collection Time: 07/18/21  3:33 PM  Result Value Ref Range   Sodium 139 135 - 145 mmol/L   Potassium 3.4 (L) 3.5 - 5.1 mmol/L   Chloride 103 98 - 111 mmol/L   CO2 26 22 - 32 mmol/L   Glucose, Bld 103 (H) 70 - 99 mg/dL    Comment: Glucose reference range applies only to samples taken after fasting for at least 8 hours.   BUN 14 6 - 20 mg/dL   Creatinine, Ser  1.22 (H) 0.44 -  1.00 mg/dL   Calcium 9.5 8.9 - 10.3 mg/dL   GFR, Estimated 57 (L) >60 mL/min    Comment: (NOTE) Calculated using the CKD-EPI Creatinine Equation (2021)    Anion gap 10 5 - 15    Comment: Performed at [facility], [address] [place], [place] [address]    PHQ2/9:    09/14/2021    8:55 AM 09/05/2021    4:27 PM 06/13/2021    1:08 PM 05/30/2021    8:43 AM 05/26/2021   12:15 PM  Depression screen PHQ 2/9  Decreased Interest 1   1 0  Down, Depressed, Hopeless 0   1 1  PHQ - 2 Score '1   2 1  '$ Altered sleeping '2   1 1  '$ Tired, decreased energy '2   1 1  '$ Change in appetite 0   1 1  Feeling bad or failure about yourself  0   1 1  Trouble concentrating 0   1 1  Moving slowly or fidgety/restless 0   0 1  Suicidal thoughts 0   0 0  PHQ-9 Score '5   7 7  '$ Difficult doing work/chores    Somewhat difficult Somewhat difficult     Information is confidential and restricted. Go to Review Flowsheets to unlock data.    phq 9 is positive   Fall Risk:    09/14/2021    8:55 AM 05/30/2021    8:44 AM 05/26/2021   12:15 PM 04/03/2021    2:37 PM 03/16/2021   11:17 AM  Fall Risk   Falls in the past year? '1 1 1 1 '$ 0  Number falls in past yr: '1 1 1 1 '$ 0  Injury with Fall? '1 1 1 1 '$ 0  Risk for fall due to : No Fall Risks  History of fall(s)    Follow up Falls prevention discussed  Falls prevention discussed        Functional Status Survey: Is the patient deaf or have difficulty hearing?: No Does the patient have difficulty seeing, even when wearing glasses/contacts?: No Does the patient have difficulty concentrating, remembering, or making decisions?: No Does the patient have difficulty walking or climbing stairs?: Yes Does the patient have difficulty dressing or bathing?: No Does the patient have difficulty doing errands alone such as visiting a doctor's office or shopping?: No    Assessment & Plan  1. Stage 3a chronic kidney disease (HCC)  -  valsartan-hydrochlorothiazide (DIOVAN-HCT) 160-12.5 MG tablet; Take 1 tablet by mouth daily.  Dispense: 90 tablet; Refill: 0 - amLODipine (NORVASC) 2.5 MG tablet; Take 1 tablet (2.5 mg total) by mouth every morning.  Dispense: 90 tablet; Refill: 1  2. Chronic bilateral low back pain without sciatica  - metaxalone (SKELAXIN) 800 MG tablet; Take 1 tablet (800 mg total) by mouth 3 (three) times daily as needed for muscle spasms.  Dispense: 90 tablet; Refill: 2  3. Essential hypertension  - valsartan-hydrochlorothiazide (DIOVAN-HCT) 160-12.5 MG tablet; Take 1 tablet by mouth daily.  Dispense: 90 tablet; Refill: 0 - amLODipine (NORVASC) 2.5 MG tablet; Take 1 tablet (2.5 mg total) by mouth every morning.  Dispense: 90 tablet; Refill: 1  4. Mild persistent asthma without complication  - montelukast (SINGULAIR) 10 MG tablet; Take 1 tablet (10 mg total) by mouth at bedtime.  Dispense: 90 tablet; Refill: 1 - Pneumococcal conjugate vaccine 20-valent (Prevnar 20) - budesonide-formoterol (SYMBICORT) 160-4.5 MCG/ACT inhaler; Inhale 2 puffs into the lungs 2 (two) times daily.  Dispense: 1 each;  Refill: 2  5. Mixed hyperlipidemia  - atorvastatin (LIPITOR) 20 MG tablet; Take 1 tablet (20 mg total) by mouth every morning.  Dispense: 90 tablet; Refill: 1  6. Need for immunization against influenza  - Flu Vaccine QUAD 6+ mos PF IM (Fluarix Quad PF)  7. Need for vaccination with 20-polyvalent pneumococcal conjugate vaccine  - Pneumococcal conjugate vaccine 20-valent (Prevnar 20)  8. Morbid obesity with BMI of 50.0-59.9, adult (HCC)  - Semaglutide-Weight Management (WEGOVY) 0.25 MG/0.5ML SOAJ; Inject 0.25 mg into the skin once a week.  Dispense: 2 mL; Refill: 0 - Semaglutide-Weight Management (WEGOVY) 0.5 MG/0.5ML SOAJ; Inject 0.5 mg into the skin once a week.  Dispense: 2 mL; Refill: 0 - Semaglutide-Weight Management (WEGOVY) 1 MG/0.5ML SOAJ; Inject 1 mg into the skin once a week.  Dispense: 2 mL;  Refill: 0

## 2021-09-14 ENCOUNTER — Encounter: Payer: Self-pay | Admitting: Family Medicine

## 2021-09-14 ENCOUNTER — Ambulatory Visit (INDEPENDENT_AMBULATORY_CARE_PROVIDER_SITE_OTHER): Payer: PRIVATE HEALTH INSURANCE | Admitting: Family Medicine

## 2021-09-14 VITALS — BP 126/84 | HR 76 | Resp 16 | Ht 64.0 in | Wt 345.0 lb

## 2021-09-14 DIAGNOSIS — M545 Low back pain, unspecified: Secondary | ICD-10-CM

## 2021-09-14 DIAGNOSIS — G8929 Other chronic pain: Secondary | ICD-10-CM

## 2021-09-14 DIAGNOSIS — I10 Essential (primary) hypertension: Secondary | ICD-10-CM | POA: Diagnosis not present

## 2021-09-14 DIAGNOSIS — Z6841 Body Mass Index (BMI) 40.0 and over, adult: Secondary | ICD-10-CM

## 2021-09-14 DIAGNOSIS — J4530 Mild persistent asthma, uncomplicated: Secondary | ICD-10-CM | POA: Diagnosis not present

## 2021-09-14 DIAGNOSIS — Z23 Encounter for immunization: Secondary | ICD-10-CM

## 2021-09-14 DIAGNOSIS — E782 Mixed hyperlipidemia: Secondary | ICD-10-CM

## 2021-09-14 DIAGNOSIS — N1831 Chronic kidney disease, stage 3a: Secondary | ICD-10-CM

## 2021-09-14 MED ORDER — METAXALONE 800 MG PO TABS: 800.0000 mg | ORAL_TABLET | Freq: Three times a day (TID) | ORAL | 2 refills | Status: DC | PRN

## 2021-09-14 MED ORDER — ATORVASTATIN CALCIUM 20 MG PO TABS: 20.0000 mg | ORAL_TABLET | ORAL | 1 refills | Status: DC

## 2021-09-14 MED ORDER — MONTELUKAST SODIUM 10 MG PO TABS: 10.0000 mg | ORAL_TABLET | Freq: Every day | ORAL | 1 refills | Status: DC

## 2021-09-14 MED ORDER — VALSARTAN-HYDROCHLOROTHIAZIDE 160-12.5 MG PO TABS: 1.0000 | ORAL_TABLET | Freq: Every day | ORAL | 0 refills | Status: DC

## 2021-09-14 MED ORDER — BUDESONIDE-FORMOTEROL FUMARATE 160-4.5 MCG/ACT IN AERO: 2.0000 | INHALATION_SPRAY | Freq: Two times a day (BID) | RESPIRATORY_TRACT | 2 refills | Status: DC

## 2021-09-14 MED ORDER — AMLODIPINE BESYLATE 2.5 MG PO TABS: 2.5000 mg | ORAL_TABLET | ORAL | 1 refills | Status: DC

## 2021-09-14 MED ORDER — WEGOVY 0.25 MG/0.5ML ~~LOC~~ SOAJ: 0.2500 mg | SUBCUTANEOUS | 0 refills | Status: DC

## 2021-09-14 MED ORDER — WEGOVY 0.5 MG/0.5ML ~~LOC~~ SOAJ: 0.5000 mg | SUBCUTANEOUS | 0 refills | Status: DC

## 2021-09-14 MED ORDER — WEGOVY 1 MG/0.5ML ~~LOC~~ SOAJ: 1.0000 mg | SUBCUTANEOUS | 0 refills | Status: DC

## 2021-09-15 ENCOUNTER — Telehealth: Payer: Self-pay | Admitting: Family Medicine

## 2021-09-15 ENCOUNTER — Encounter: Payer: Self-pay | Admitting: Family Medicine

## 2021-09-15 ENCOUNTER — Ambulatory Visit: Payer: Self-pay

## 2021-09-15 NOTE — Telephone Encounter (Signed)
Spoke with patient and there was a miscommunication between her and her STD/FMLA [employer]. She is currently out on disability due to ortho surgery.

## 2021-09-15 NOTE — Telephone Encounter (Signed)
Juliann Pulse calling from [employer] STD is calling to verify if the patient was taken out of work due HTN. Needing dates of tx, and return to work date. [phone] Fax-[phone]

## 2021-09-15 NOTE — Telephone Encounter (Signed)
She apologizes for the confusion. Her message is that she has corrected things with guardian, paperwork will still need to be submitted for any intermittent appts going forward.   Best contact: [phone]

## 2021-09-15 NOTE — Telephone Encounter (Signed)
Once patient verifies with her insurance about Darcel Bayley I will tee up and send to pharmacy.

## 2021-09-15 NOTE — Telephone Encounter (Signed)
  Chief Complaint: medication assistance  Symptoms: NA Frequency: today Pertinent Negatives: NA Disposition: '[]'$ ED /'[]'$ Urgent Care (no appt availability in office) / '[]'$ Appointment(In office/virtual)/ '[]'$  [facility]/ '[]'$ Home Care/ '[]'$ Refused Recommended Disposition /'[]'$ [facility]/ '[x]'$  Follow-up with PCP Additional Notes: pt calling to see if 90 DS can be sent for Symbicort and Metaxalone since they are high in 30 DS. Also unable to get low dose of Wegovy. Only available in 1.'7mg'$  but unable to get without rx for that to stock up while in stock. Pt is also is there option for Ozempic or truliticity since those aren't has hard to get. I advised her with being non DM she probably wouldn't get approved from insurance for those medications and I asked if she checked with pharmacy about [facility], she was going to call to see if that was in stock or not and would let nurse know when get gets CB.   Reason for Disposition  [1] Prescription not at pharmacy AND [2] was prescribed by PCP recently (Exception: [facility] has access to EMR and prescription is recorded there. Go to Home Care and confirm for pharmacy.)  Answer Assessment - Initial Assessment Questions 1. NAME of MEDICINE: "What medicine(s) are you calling about?"     Symbicort and Metaxalone and WEgovy 2. QUESTION: "What is your question?" (e.g., double dose of medicine, side effect)     Either needing 90 day supply on medications or alternative for Symbicort and metaxalone. Mancel Parsons she is unable to get in anything other than 1.'7mg'$ . She is asking if Ozempic or something different could be sent in.  3. PRESCRIBER: "Who prescribed the medicine?" Reason: if prescribed by specialist, call should be referred to that group.     Dr. Ancil Boozer  Protocols used: Medication Question Call-A-AH

## 2021-09-15 NOTE — Telephone Encounter (Signed)
   Summary: Med questions   She has questions about symbicort and a new muscle relaxer, she says neither of them are affordable. She wants the prescriptions increased to 3 month supplies or just change the prescriptions altogether.   Pt also has questions about Wegovy, wants to know if she should receive a higher dose for wegovy or if she should receive ozempic or trulicity as an alternative

## 2021-09-18 ENCOUNTER — Other Ambulatory Visit: Payer: Self-pay | Admitting: Family Medicine

## 2021-09-18 DIAGNOSIS — J4530 Mild persistent asthma, uncomplicated: Secondary | ICD-10-CM

## 2021-09-18 MED ORDER — BUDESONIDE-FORMOTEROL FUMARATE 160-4.5 MCG/ACT IN AERO: 2.0000 | INHALATION_SPRAY | Freq: Two times a day (BID) | RESPIRATORY_TRACT | 0 refills | Status: DC

## 2021-09-22 ENCOUNTER — Telehealth: Payer: Self-pay

## 2021-09-22 NOTE — Telephone Encounter (Signed)
Patients states she her employer still has not received the fmla forms and it is important to have forms sent in asap  Pt is requesting a all back  Please fu w/ pt

## 2021-09-22 NOTE — Telephone Encounter (Signed)
Called patient and let her know per Dr. Ancil Boozer she needs an appointment to complete FMLA. Nothing about FMLA/STD was mentioned in her recent visit. She said she will get the forms faxed to [name] and will schedule an appointment.

## 2021-09-22 NOTE — Telephone Encounter (Unsigned)
Copied from [place] [id number]. Topic: Appointment Scheduling - Scheduling Inquiry for Clinic >> Sep 22, 2021  3:12 PM Ja-Kwan M wrote: Reason for CRM: Pt requested that a message be sent advising that her employer did not approve her time for the appt that was scheduled for 09/26/21. Pt stated she was advised that appt was not available but she was worked in. Offered pt the next few available appts with Dr. Ancil Boozer but she declined stating she needs something sooner because the appt is for [facility] paperwork. Pt requests call back for sooner appt. >> Sep 22, 2021  4:14 PM Suanne Marker B wrote: Spoke with Ancil Boozer and the only other appt we could offer her is Monday 09-25-2021 at 11:40. Lvm letting pt know to give [name] a call back so we can schedule this as a work in per [employer] and the patient will need to bring in paper work in for [employer] >> Sep 22, 2021  4:05 PM Suanne Marker B wrote: We made the patient fr Tuesday 09-26-2021 and was ok now this is the message. Where do you want to ut her? Please advise

## 2021-09-22 NOTE — Telephone Encounter (Signed)
Called pt again before I lft to let her know that I would be leaving in about 20 min and that the cost center could not schedule her in as a work in that it would have to be [name] in the office and asked her to give [name] a  call so that we could put her in for Monday 09-25-2021 at 11:40.

## 2021-09-25 ENCOUNTER — Encounter: Payer: Self-pay | Admitting: Family Medicine

## 2021-09-25 ENCOUNTER — Ambulatory Visit (INDEPENDENT_AMBULATORY_CARE_PROVIDER_SITE_OTHER): Payer: PRIVATE HEALTH INSURANCE | Admitting: Family Medicine

## 2021-09-25 VITALS — BP 138/86 | HR 93 | Resp 16 | Ht 64.0 in | Wt 345.0 lb

## 2021-09-25 DIAGNOSIS — I10 Essential (primary) hypertension: Secondary | ICD-10-CM

## 2021-09-25 DIAGNOSIS — N1831 Chronic kidney disease, stage 3a: Secondary | ICD-10-CM | POA: Diagnosis not present

## 2021-09-25 DIAGNOSIS — Z9889 Other specified postprocedural states: Secondary | ICD-10-CM

## 2021-09-25 NOTE — Progress Notes (Signed)
Name: Barbara Pennington   MRN: [id number]    DOB: Nov 25, 1980   Date:09/25/2021       Progress Note  Subjective  Chief Complaint  FMLA Paperwork  HPI  She had knee surgery on 07/17 and just went back to work on 09/18/2021. She brought FMLA forms to be filled out since she continues to have PT and also has regular doctor visit with Dr. Shea Evans  ( psychiatrist ) between once a month to every three months, Dr. Lanora Manis ( Nephrologist ) about once  year , with me every 6 months. However since surgery she has PT once a week and if approved for FMLA it will increase to twice a week.   She states pain since surgery improved, but is getting PT to strengthen her knee, still having difficulty standing or sitting for a prolonged period of time . She is still taking ibuprofen and icing it multiple times a day - right knee  Patient Active Problem List   Diagnosis Date Noted   Adjustment disorder with depressed mood 06/13/2021   Closed nondisplaced longitudinal fracture of right patella 06/09/2021   GAD (generalized anxiety disorder) 05/05/2021   Depressive disorder 05/05/2021   History of borderline personality disorder 05/05/2021   History of alcohol abuse 05/05/2021   Internal derangement of left knee 04/03/2021   Primary osteoarthritis of left knee 86/57/8469   Metabolic syndrome 62/95/2841   Impaired fasting glucose 12/06/2020   Lymphedema 12/06/2020   Moderate episode of recurrent major depressive disorder ([place]) 02/08/2020   CKD (chronic kidney disease) stage 3, GFR 30-59 ml/min ([facility]) 06/11/2019   PTSD (post-traumatic stress disorder) 01/14/2019   Borderline personality disorder ([place]) 01/14/2019   Anxiety 01/14/2019   Mixed hyperlipidemia 01/14/2019   Morbid obesity with BMI of 50.0-59.9, adult ([place]) 01/14/2019   OSA on CPAP 01/14/2019   Asthma, mild persistent 04/30/2018   Low back pain 08/20/2017   Fibroadenoma of breast, 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   Essential hypertension     Past Surgical  History:  Procedure Laterality Date   ABDOMINAL HYSTERECTOMY  01/08/2006   BREAST BIOPSY Left 01/09/2011   CORE - FIBROADENOMA VS PHYLLODES   CESAREAN SECTION  01/08/2005   COLONOSCOPY WITH PROPOFOL N/A 10/09/2016   Procedure: COLONOSCOPY WITH PROPOFOL;  Surgeon: Jonathon Bellows, MD;  Location: [facility] ENDOSCOPY;  Service: Gastroenterology;  Laterality: N/A;   COLONOSCOPY WITH PROPOFOL N/A 11/09/2019   Procedure: COLONOSCOPY WITH PROPOFOL;  Surgeon: Lin Landsman, MD;  Location[facility] ENDOSCOPY;  Service: Gastroenterology;  Laterality: N/A;   ESOPHAGOGASTRODUODENOSCOPY     GUM SURGERY  01/08/2002   KNEE ARTHROSCOPY WITH MEDIAL MENISECTOMY Right 07/24/2021   Procedure: Right medial meniscus root repair and patella chondroplasty;  Surgeon: Leim Fabry, MD;  Location: [facility] ORS;  Service: Orthopedics;  Laterality: Right;   PLANTAR FASCIA RELEASE Left 08/19/2014   Procedure: ENDOSCOPIC PLANTAR FASCIOTOMY RELEASE L WITH TOPAZ;  Surgeon: Albertine Patricia, DPM;  Location: [place];  Service: Podiatry;  Laterality: Left;  LMA WITH POPLITEAL BLOCK   TONSILLECTOMY  01/08/1982    Family History  Problem Relation Age of Onset   Obesity Mother    Diabetes Mother    High Cholesterol Mother    Hypertension Mother    Neuropathy Mother    Depression Mother    Anxiety disorder Mother    Obesity Father    Hypertension Father    High Cholesterol Father    Depression Father    Anxiety disorder Father    Testicular cancer Brother  Mental illness Maternal Aunt    Hypertension Maternal Grandfather    High Cholesterol Maternal Grandfather    Heart disease Maternal Grandfather        has a pacemaker   Stroke Maternal Grandfather    Breast cancer Maternal Grandmother    Hypertension Maternal Grandmother    Aneurysm Maternal Grandmother    Stroke Maternal Grandmother    Colon cancer Paternal Grandfather    Alzheimer's disease Paternal Grandmother    Dementia Paternal Grandmother     Suicidality Cousin     Social History   Tobacco Use   Smoking status: Former    Packs/day: 0.25    Years: 10.00    Total pack years: 2.50    Types: Cigarettes    Quit date: 07/05/2014    Years since quitting: 7.2   Smokeless tobacco: Never  Substance Use Topics   Alcohol use: Yes    Comment: rare     Current Outpatient Medications:    acetaminophen (TYLENOL) 500 MG tablet, Take 2 tablets (1,000 mg total) by mouth every 8 (eight) hours., Disp: 90 tablet, Rfl: 2   albuterol (VENTOLIN HFA) 108 (90 Base) MCG/ACT inhaler, INHALE 2 PUFFS BY MOUTH EVERY 6 HOURS AS NEEDED FOR WHEEZING AND FOR SHORTNESS OF BREATH, Disp: 9 g, Rfl: 0   amLODipine (NORVASC) 2.5 MG tablet, Take 1 tablet (2.5 mg total) by mouth every morning., Disp: 90 tablet, Rfl: 1   atorvastatin (LIPITOR) 20 MG tablet, Take 1 tablet (20 mg total) by mouth every morning., Disp: 90 tablet, Rfl: 1   budesonide-formoterol (SYMBICORT) 160-4.5 MCG/ACT inhaler, Inhale 2 puffs into the lungs 2 (two) times daily., Disp: 3 each, Rfl: 0   buPROPion (WELLBUTRIN XL) 300 MG 24 hr tablet, Take 1 tablet (300 mg total) by mouth every morning., Disp: 90 tablet, Rfl: 0   busPIRone (BUSPAR) 15 MG tablet, Take 1 tablet by mouth twice daily, Disp: 180 tablet, Rfl: 0   escitalopram (LEXAPRO) 20 MG tablet, Take 1 tablet (20 mg total) by mouth daily., Disp: 90 tablet, Rfl: 0   gabapentin (NEURONTIN) 100 MG capsule, Take 1 capsule (100 mg total) by mouth in the morning and at bedtime., Disp: 60 capsule, Rfl: 1   hydrOXYzine (ATARAX) 50 MG tablet, Take 1 tablet (50 mg total) by mouth 2 (two) times daily., Disp: 180 tablet, Rfl: 0   ibuprofen (ADVIL) 800 MG tablet, Take 800 mg by mouth every 8 (eight) hours as needed., Disp: , Rfl:    metaxalone (SKELAXIN) 800 MG tablet, Take 1 tablet (800 mg total) by mouth 3 (three) times daily as needed for muscle spasms., Disp: 90 tablet, Rfl: 2   montelukast (SINGULAIR) 10 MG tablet, Take 1 tablet (10 mg total) by  mouth at bedtime., Disp: 90 tablet, Rfl: 1   Semaglutide-Weight Management (WEGOVY) 0.25 MG/0.5ML SOAJ, Inject 0.25 mg into the skin once a week., Disp: 2 mL, Rfl: 0   Semaglutide-Weight Management (WEGOVY) 0.5 MG/0.5ML SOAJ, Inject 0.5 mg into the skin once a week., Disp: 2 mL, Rfl: 0   Semaglutide-Weight Management (WEGOVY) 1 MG/0.5ML SOAJ, Inject 1 mg into the skin once a week., Disp: 2 mL, Rfl: 0   valsartan-hydrochlorothiazide (DIOVAN-HCT) 160-12.5 MG tablet, Take 1 tablet by mouth daily., Disp: 90 tablet, Rfl: 0  Allergies  Allergen Reactions   Latex Rash   Metformin And Related     diarrhea    I personally reviewed active problem list, medication list, allergies, family history, social history, health maintenance with  the patient/caregiver today.   ROS  Ten systems reviewed and is negative except as mentioned in HPI   Objective  Vitals:   09/25/21 1133  BP: 138/86  Pulse: 93  Resp: 16  SpO2: 96%  Weight: (!) 345 lb (156.5 kg)  Height: '5\' 4"'$  (1.626 m)    Body mass index is 59.22 kg/m.  Physical Exam  Constitutional: Patient appears well-developed and well-nourished. Obese  No distress.  HEENT: head atraumatic, normocephalic, pupils equal and reactive to light, neck supple Cardiovascular: Normal rate, regular rhythm and normal heart sounds.  No murmur heard. No BLE edema. Pulmonary/Chest: Effort normal and breath sounds normal. No respiratory distress. Abdominal: Soft.  There is no tenderness. Muscular Skeletal: pain during extension of right knee , crepitus with extension of left knee  Psychiatric: Patient has a normal mood and affect. behavior is normal. Judgment and thought content normal.   Recent Results (from the past 2160 hour(s))  CBC     Status: None   Collection Time: 07/18/21  3:33 PM  Result Value Ref Range   WBC 9.3 4.0 - 10.5 K/uL   RBC 4.53 3.87 - 5.11 MIL/uL   Hemoglobin 13.6 12.0 - 15.0 g/dL   HCT 41.1 36.0 - 46.0 %   MCV 90.7 80.0 - 100.0 fL    MCH 30.0 26.0 - 34.0 pg   MCHC 33.1 30.0 - 36.0 g/dL   RDW 12.7 11.5 - 15.5 %   Platelets 219 150 - 400 K/uL   nRBC 0.0 0.0 - 0.2 %    Comment: Performed at [facility], [address] [place], [place] [address]  Basic metabolic panel     Status: Abnormal   Collection Time: 07/18/21  3:33 PM  Result Value Ref Range   Sodium 139 135 - 145 mmol/L   Potassium 3.4 (L) 3.5 - 5.1 mmol/L   Chloride 103 98 - 111 mmol/L   CO2 26 22 - 32 mmol/L   Glucose, Bld 103 (H) 70 - 99 mg/dL    Comment: Glucose reference range applies only to samples taken after fasting for at least 8 hours.   BUN 14 6 - 20 mg/dL   Creatinine, Ser 1.22 (H) 0.44 - 1.00 mg/dL   Calcium 9.5 8.9 - 10.3 mg/dL   GFR, Estimated 57 (L) >60 mL/min    Comment: (NOTE) Calculated using the CKD-EPI Creatinine Equation (2021)    Anion gap 10 5 - 15    Comment: Performed at [facility], [address] [name], [place] [address]    PHQ2/9:    09/25/2021   11:31 AM 09/14/2021    8:55 AM 09/05/2021    4:27 PM 06/13/2021    1:08 PM 05/30/2021    8:43 AM  Depression screen PHQ 2/9  Decreased Interest 0 1   1  Down, Depressed, Hopeless 0 0   1  PHQ - 2 Score 0 1   2  Altered sleeping '3 2   1  '$ Tired, decreased energy '3 2   1  '$ Change in appetite 0 0   1  Feeling bad or failure about yourself  0 0   1  Trouble concentrating 0 0   1  Moving slowly or fidgety/restless 0 0   0  Suicidal thoughts 0 0   0  PHQ-9 Score '6 5   7  '$ Difficult doing work/chores     Somewhat difficult     Information is confidential and restricted. Go to Review Flowsheets  to unlock data.    phq 9 is negative   Fall Risk:    09/25/2021   11:30 AM 09/14/2021    8:55 AM 05/30/2021    8:44 AM 05/26/2021   12:15 PM 04/03/2021    2:37 PM  Fall Risk   Falls in the past year? '1 1 1 1 1  '$ Number falls in past yr: '1 1 1 1 1  '$ Injury with Fall? '1 1 1 1 1  '$ Risk for fall due to : No Fall Risks No Fall Risks  History of fall(s)   Follow up  Falls prevention discussed Falls prevention discussed  Falls prevention discussed       Functional Status Survey: Is the patient deaf or have difficulty hearing?: No Does the patient have difficulty seeing, even when wearing glasses/contacts?: No Does the patient have difficulty concentrating, remembering, or making decisions?: No Does the patient have difficulty walking or climbing stairs?: Yes Does the patient have difficulty dressing or bathing?: No Does the patient have difficulty doing errands alone such as visiting a doctor's office or shopping?: No    Assessment & Plan  1. History of right knee surgery  She still needs PT, multiple medical visits. We will fill out the FMLA forms  2. Stage 3a chronic kidney disease ([place])  Keep yearly follow up with nephrologist   3. Essential hypertension  At goal

## 2021-09-26 ENCOUNTER — Ambulatory Visit: Payer: PRIVATE HEALTH INSURANCE | Admitting: Family Medicine

## 2021-10-03 ENCOUNTER — Encounter: Payer: Self-pay | Admitting: Psychiatry

## 2021-10-03 ENCOUNTER — Telehealth (INDEPENDENT_AMBULATORY_CARE_PROVIDER_SITE_OTHER): Payer: PRIVATE HEALTH INSURANCE | Admitting: Psychiatry

## 2021-10-03 DIAGNOSIS — F4321 Adjustment disorder with depressed mood: Secondary | ICD-10-CM | POA: Diagnosis not present

## 2021-10-03 DIAGNOSIS — F411 Generalized anxiety disorder: Secondary | ICD-10-CM | POA: Diagnosis not present

## 2021-10-03 DIAGNOSIS — F4310 Post-traumatic stress disorder, unspecified: Secondary | ICD-10-CM | POA: Diagnosis not present

## 2021-10-03 DIAGNOSIS — Z8659 Personal history of other mental and behavioral disorders: Secondary | ICD-10-CM | POA: Diagnosis not present

## 2021-10-03 DIAGNOSIS — F1011 Alcohol abuse, in remission: Secondary | ICD-10-CM

## 2021-10-03 MED ORDER — BUPROPION HCL ER (XL) 300 MG PO TB24: 300.0000 mg | ORAL_TABLET | Freq: Every morning | ORAL | 0 refills | Status: DC

## 2021-10-03 NOTE — Progress Notes (Signed)
Virtual Visit via Video Note  I connected with Barbara Pennington on 10/03/21 at 10:00 AM EDT by a video enabled telemedicine application and verified that I am speaking with the correct person using two identifiers.  Location Provider Location : [facility] Patient Location : Car  Participants: Patient , Provider    I discussed the limitations of evaluation and management by telemedicine and the availability of in person appointments. The patient expressed understanding and agreed to proceed.   I discussed the assessment and treatment plan with the patient. The patient was provided an opportunity to ask questions and all were answered. The patient agreed with the plan and demonstrated an understanding of the instructions.   The patient was advised to call back or seek an in-person evaluation if the symptoms worsen or if the condition fails to improve as anticipated.   [place] MD OP Progress Note  10/03/2021 12:52 PM Barbara Pennington  MRN:  [id number]  Chief Complaint:  Chief Complaint  Patient presents with   Follow-up   Anxiety   Depression   HPI: Barbara Pennington is a 41 year old Caucasian female, divorced, currently lives in [place], has a history of PTSD, anxiety, borderline personality disorder, morbid obesity, OSA on CPAP, hypertension/chronic kidney disease, asthma, chronic back pain, status post right sided knee surgery, currently back at work, presented for medication management, evaluated by telemedicine today.  Patient reports overall she is doing well with regards to her depression.  She however reports anxiety mostly about her pain.  She continues to be in a lot of pain especially of her knee, right-sided.  She reports her provider recently increased her gabapentin to 300 mg 3 times a day, gabapentin was initiated by Probation officer last visit for anxiety.  She reports she has not noticed much difference with her pain on the gabapentin.  She does take ibuprofen as needed to help with pain and that  also does not seem to help.  She reports the pain does have an impact on her sleep.  She continues to use CPAP for sleep apnea.  However since she is not really sleeping much due to the pain she does wake up feeling tired and lethargic and affects her ability to function during the day.  Patient is compliant on the Wellbutrin, Lexapro.  Denies side effects.  Denies any suicidality, homicidality or perceptual disturbances.  Patient denies any other concerns today.  Visit Diagnosis:    ICD-10-CM   1. PTSD (post-traumatic stress disorder)  F43.10 buPROPion (WELLBUTRIN XL) 300 MG 24 hr tablet    2. GAD (generalized anxiety disorder)  F41.1 buPROPion (WELLBUTRIN XL) 300 MG 24 hr tablet    3. Adjustment disorder with depressed mood  F43.21     4. History of borderline personality disorder  Z86.59 buPROPion (WELLBUTRIN XL) 300 MG 24 hr tablet    5. History of alcohol abuse  F10.11       Past Psychiatric History: Reviewed past psychiatric history from progress note on 05/05/2021.  Patient was previously under the care of [facility].  Past Medical History:  Past Medical History:  Diagnosis Date   Anxiety    Arthritis 2006   rhuematoid - mild - no current issues   Asthma 2003   Borderline personality disorder ([place])    Chronic kidney disease    stage 3   Complication of anesthesia    "Usually" has low temp after surgery.After hysterectomy temp was 94   Depression    GERD (gastroesophageal reflux disease)  History of kidney stones    Hyperlipidemia    Hypertension 2013   Lump or mass in breast 2013   RIGHT BREAST   Motion sickness    repeated amusement park rides   PTSD (post-traumatic stress disorder)    Scoliosis    no current issues   Shortness of breath dyspnea    secondary to weight    Sleep apnea    uses cpap   Ulcer 2001    Past Surgical History:  Procedure Laterality Date   ABDOMINAL HYSTERECTOMY  01/08/2006   BREAST BIOPSY Left 01/09/2011   CORE - FIBROADENOMA VS  PHYLLODES   CESAREAN SECTION  01/08/2005   COLONOSCOPY WITH PROPOFOL N/A 10/09/2016   Procedure: COLONOSCOPY WITH PROPOFOL;  Surgeon: Jonathon Bellows, MD;  [facility];  Service: Gastroenterology;  Laterality: N/A;   COLONOSCOPY WITH PROPOFOL N/A 11/09/2019   Procedure: COLONOSCOPY WITH PROPOFOL;  Surgeon: Lin Landsman, MD;  Location: [facility] ENDOSCOPY;  Service: Gastroenterology;  Laterality: N/A;   ESOPHAGOGASTRODUODENOSCOPY     GUM SURGERY  01/08/2002   KNEE ARTHROSCOPY WITH MEDIAL MENISECTOMY Right 07/24/2021   Procedure: Right medial meniscus root repair and patella chondroplasty;  Surgeon: Leim Fabry, MD;  Location: [facility] ORS;  Service: Orthopedics;  Laterality: Right;   PLANTAR FASCIA RELEASE Left 08/19/2014   Procedure: ENDOSCOPIC PLANTAR FASCIOTOMY RELEASE L WITH TOPAZ;  Surgeon: Albertine Patricia, DPM;  Location: [facility];  Service: Podiatry;  Laterality: Left;  LMA WITH POPLITEAL BLOCK   TONSILLECTOMY  01/08/1982    Family Psychiatric History: Reviewed family psychiatric history from progress note on 05/05/2021.  Family History:  Family History  Problem Relation Age of Onset   Obesity Mother    Diabetes Mother    High Cholesterol Mother    Hypertension Mother    Neuropathy Mother    Depression Mother    Anxiety disorder Mother    Obesity Father    Hypertension Father    High Cholesterol Father    Depression Father    Anxiety disorder Father    Testicular cancer Brother    Mental illness Maternal Aunt    Hypertension Maternal Grandfather    High Cholesterol Maternal Grandfather    Heart disease Maternal Grandfather        has a pacemaker   Stroke Maternal Grandfather    Breast cancer Maternal Grandmother    Hypertension Maternal Grandmother    Aneurysm Maternal Grandmother    Stroke Maternal Grandmother    Colon cancer Paternal Grandfather    Alzheimer's disease Paternal Grandmother    Dementia Paternal Grandmother    Suicidality Cousin      Social History: Reviewed mood social history from progress note on 05/05/2021. Social History   Socioeconomic History   Marital status: Divorced    Spouse name: Not on file   Number of children: 2   Years of education: 14   Highest education level: Bachelor's degree (e.g., BA, AB, BS)  Occupational History   Not on file  Tobacco Use   Smoking status: Former    Packs/day: 0.25    Years: 10.00    Total pack years: 2.50    Types: Cigarettes    Quit date: 07/05/2014    Years since quitting: 7.2   Smokeless tobacco: Never  Vaping Use   Vaping Use: Never used  Substance and Sexual Activity   Alcohol use: Yes    Comment: rare   Drug use: No   Sexual activity: Yes    Partners:  Male    Birth control/protection: Surgical    Comment: 2007  Other Topics Concern   Not on file  Social History Narrative   Not on file   Social Determinants of Health   Financial Resource Strain: High Risk (05/31/2020)   Overall Financial Resource Strain (CARDIA)    Difficulty of Paying Living Expenses: Hard  Food Insecurity: No Food Insecurity (05/31/2020)   Hunger Vital Sign    Worried About Running Out of Food in the Last Year: Never true    Ran Out of Food in the Last Year: Never true  Transportation Needs: No Transportation Needs (05/31/2020)   PRAPARE - Hydrologist (Medical): No    Lack of Transportation (Non-Medical): No  Physical Activity: Unknown (05/31/2020)   Exercise Vital Sign    Days of Exercise per Week: 0 days    Minutes of Exercise per Session: Not on file  Stress: Stress Concern Present (05/31/2020)   [place]    Feeling of Stress : To some extent  Social Connections: Socially Isolated (05/31/2020)   Social Connection and Isolation Panel [NHANES]    Frequency of Communication with Friends and Family: More than three times a week    Frequency of Social Gatherings with Friends and  Family: Never    Attends Religious Services: Never    Marine scientist or Organizations: No    Attends Music therapist: Not on file    Marital Status: Divorced    Allergies:  Allergies  Allergen Reactions   Latex Rash   Metformin And Related     diarrhea    Metabolic Disorder Labs: Lab Results  Component Value Date   HGBA1C 5.3 12/06/2020   MPG 105 12/06/2020   MPG 114 08/06/2019   No results found for: "PROLACTIN" Lab Results  Component Value Date   CHOL 171 12/06/2020   TRIG 87 12/06/2020   HDL 42 (L) 12/06/2020   CHOLHDL 4.1 12/06/2020   VLDL 27 08/06/2019   LDLCALC 111 (H) 12/06/2020   LDLCALC 91 08/06/2019   Lab Results  Component Value Date   TSH 2.59 04/04/2018   TSH 1.65 08/26/2013    Therapeutic Level Labs: No results found for: "LITHIUM" No results found for: "VALPROATE" No results found for: "CBMZ"  Current Medications: Current Outpatient Medications  Medication Sig Dispense Refill   acetaminophen (TYLENOL) 500 MG tablet Take 2 tablets (1,000 mg total) by mouth every 8 (eight) hours. 90 tablet 2   albuterol (VENTOLIN HFA) 108 (90 Base) MCG/ACT inhaler INHALE 2 PUFFS BY MOUTH EVERY 6 HOURS AS NEEDED FOR WHEEZING AND FOR SHORTNESS OF BREATH 9 g 0   amLODipine (NORVASC) 2.5 MG tablet Take 1 tablet (2.5 mg total) by mouth every morning. 90 tablet 1   atorvastatin (LIPITOR) 20 MG tablet Take 1 tablet (20 mg total) by mouth every morning. 90 tablet 1   budesonide-formoterol (SYMBICORT) 160-4.5 MCG/ACT inhaler Inhale 2 puffs into the lungs 2 (two) times daily. 3 each 0   busPIRone (BUSPAR) 15 MG tablet Take 1 tablet by mouth twice daily 180 tablet 0   escitalopram (LEXAPRO) 20 MG tablet Take 1 tablet (20 mg total) by mouth daily. 90 tablet 0   gabapentin (NEURONTIN) 300 MG capsule Take 1 capsule by mouth 3 (three) times daily.     hydrOXYzine (ATARAX) 50 MG tablet Take 1 tablet (50 mg total) by mouth 2 (two) times daily. 180 tablet  0    ibuprofen (ADVIL) 800 MG tablet Take 800 mg by mouth every 8 (eight) hours as needed.     Melatonin 10 MG CAPS Take 10-20 mg by mouth at bedtime.     metaxalone (SKELAXIN) 800 MG tablet Take 1 tablet (800 mg total) by mouth 3 (three) times daily as needed for muscle spasms. 90 tablet 2   montelukast (SINGULAIR) 10 MG tablet Take 1 tablet (10 mg total) by mouth at bedtime. 90 tablet 1   valsartan-hydrochlorothiazide (DIOVAN-HCT) 160-12.5 MG tablet Take 1 tablet by mouth daily. 90 tablet 0   buPROPion (WELLBUTRIN XL) 300 MG 24 hr tablet Take 1 tablet (300 mg total) by mouth every morning. 90 tablet 0   Semaglutide-Weight Management (WEGOVY) 0.25 MG/0.5ML SOAJ Inject 0.25 mg into the skin once a week. (Patient not taking: Reported on 10/03/2021) 2 mL 0   Semaglutide-Weight Management (WEGOVY) 0.5 MG/0.5ML SOAJ Inject 0.5 mg into the skin once a week. (Patient not taking: Reported on 10/03/2021) 2 mL 0   Semaglutide-Weight Management (WEGOVY) 1 MG/0.5ML SOAJ Inject 1 mg into the skin once a week. (Patient not taking: Reported on 10/03/2021) 2 mL 0   No current facility-administered medications for this visit.     Musculoskeletal: Strength & Muscle Tone:  UTA Gait & Station:  Seated Patient leans: N/A  Psychiatric Specialty Exam: Review of Systems  Constitutional:  Positive for fatigue.  Musculoskeletal:        Rt.sided knee pain  Psychiatric/Behavioral:  Positive for sleep disturbance. The patient is nervous/anxious.   All other systems reviewed and are negative.   There were no vitals taken for this visit.There is no height or weight on file to calculate BMI.  General Appearance: Casual  Eye Contact:  Fair  Speech:  Clear and Coherent  Volume:  Normal  Mood:  Anxious  Affect:  Full Range  Thought Process:  Goal Directed and Descriptions of Associations: Intact  Orientation:  Full (Time, Place, and Person)  Thought Content: Logical   Suicidal Thoughts:  No  Homicidal Thoughts:  No   Memory:  Immediate;   Fair Recent;   Fair Remote;   Fair  Judgement:  Fair  Insight:  Fair  Psychomotor Activity:  Normal  Concentration:  Concentration: Fair and Attention Span: Fair  Recall:  [employer] of Knowledge: Fair  Language: Fair  Akathisia:  No  Handed:  Right  AIMS (if indicated): not done  Assets:  Communication Skills Desire for [place] Talents/Skills Transportation  ADL's:  Intact  Cognition: WNL  Sleep:  Poor   Screenings: AIMS    Flowsheet Row Video Visit from 06/13/2021 in [facility] [place] [facility] Office Visit from 05/05/2021 in [place] [facility] Total Score 0 0      GAD-7    Welcome Office Visit from 05/30/2021 in [place] [facility] [place] from 05/26/2021 in [facility] Office Visit from 05/05/2021 in [place] Office Visit from 04/03/2021 in [place] [facility] at [facility] Video Visit from 08/09/2020 in [facility]  Total GAD-7 Score '11 7 17 7 10      '$ PHQ2-9    Flowsheet Row Video Visit from 10/03/2021 in [place] Office Visit from 09/25/2021 in [facility] Office Visit from 09/14/2021 in [facility] Video Visit from 09/05/2021 in [facility] Video Visit from 06/13/2021 in [place]  PHQ-2 Total Score 0 0 1 2 0  PHQ-9 Total Score '7 6 5 8 4      '$ Flowsheet Row Video Visit from 10/03/2021 in [facility] Video Visit from 09/05/2021 in [place] Admission (Discharged) from 07/24/2021 in [place]  C-SSRS RISK CATEGORY Low Risk Low Risk No Risk        Assessment and Plan: Barbara Pennington is a 41 year old  Caucasian female with history of PTSD, borderline personality disorder, depression, GAD was evaluated by telemedicine today.  Patient continues to struggle with pain, rt. Sided knee, which does have an impact on sleep and her energy level during the day.  Patient will benefit from following plan.  Plan PTSD-improving Lexapro 20 mg p.o. daily BuSpar 15 mg p.o. twice daily Patient to establish care with therapist.  GAD-some improvement BuSpar 15 mg p.o. twice daily Lexapro 20 mg p.o. daily Gabapentin 300 mg p.o. 3 times daily recently increased by her pain provider. Referred for CBT-pending  Adjustment disorder with depressed mood-improving Wellbutrin XL 300 mg p.o. daily in the morning With sleep problems due to pain, discussed adding a sleep medication.  She is not interested.  She will benefit from sufficient pain management.  She does have upcoming appointment with provider. Patient does have hydroxyzine 50 mg twice a day available, she could use the hydroxyzine 50 mg or a total of 100 mg at bedtime as needed.  Advised to limit the amount of hydroxyzine during the day and to use it only as needed.  Patient advised to reduce the dosage of melatonin to a 10 mg at bedtime, currently on 20 mg, if she is going to start the hydroxyzine at bedtime for sleep.  History of borderline personality disorder-pending medical records from [place].  History of alcohol abuse-patient has been sober since 2017.  Follow-up in clinic in 4 to 6 weeks or sooner if needed.  Collaboration of Care: Collaboration of Care: Other patient to follow-up with her providers for pain management.  Patient/Guardian was advised Release of Information must be obtained prior to any record release in order to collaborate their care with an outside provider. Patient/Guardian was advised if they have not already done so to contact the registration department to sign all necessary forms in order for [name] to release information regarding  their care.   Consent: Patient/Guardian gives verbal consent for treatment and assignment of benefits for services provided during this visit. Patient/Guardian expressed understanding and agreed to proceed.   This note was generated in part or whole with voice recognition software. Voice recognition is usually quite accurate but there are transcription errors that can and very often do occur. I apologize for any typographical errors that were not detected and corrected.      Ursula Alert, MD 10/03/2021, 12:52 PM

## 2021-10-05 ENCOUNTER — Telehealth: Payer: Self-pay

## 2021-10-05 DIAGNOSIS — F411 Generalized anxiety disorder: Secondary | ICD-10-CM

## 2021-10-05 DIAGNOSIS — F4310 Post-traumatic stress disorder, unspecified: Secondary | ICD-10-CM

## 2021-10-05 DIAGNOSIS — Z8659 Personal history of other mental and behavioral disorders: Secondary | ICD-10-CM

## 2021-10-05 NOTE — Telephone Encounter (Signed)
submitted the prior auth - pending

## 2021-10-05 NOTE — Telephone Encounter (Signed)
prior auth needed for the bupropion '300mg'$  24 hour -

## 2021-10-10 ENCOUNTER — Other Ambulatory Visit: Payer: Self-pay | Admitting: Orthopedic Surgery

## 2021-10-10 DIAGNOSIS — S83249A Other tear of medial meniscus, current injury, unspecified knee, initial encounter: Secondary | ICD-10-CM

## 2021-10-11 ENCOUNTER — Other Ambulatory Visit: Payer: Self-pay | Admitting: Family Medicine

## 2021-10-11 DIAGNOSIS — Z8659 Personal history of other mental and behavioral disorders: Secondary | ICD-10-CM

## 2021-10-11 DIAGNOSIS — F411 Generalized anxiety disorder: Secondary | ICD-10-CM

## 2021-10-11 MED ORDER — ESCITALOPRAM OXALATE 20 MG PO TABS: 20.0000 mg | ORAL_TABLET | Freq: Every day | ORAL | 0 refills | Status: DC

## 2021-10-11 NOTE — Telephone Encounter (Signed)
Medication Refill - Medication: 90 day supply with 3 refills for escitalopram (LEXAPRO) 20 MG tablet   (Agent: If yes, when and what did the pharmacy advise?) [facility] Is calling  Preferred Pharmacy (with phone number or street name):  [facility] - [address], [address] [place] Phone:  [phone]  Fax:  [phone]      Has the patient been seen for an appointment in the last year OR does the patient have an upcoming appointment? Yes.    Agent: Please be advised that RX refills may take up to 3 business days. We ask that you follow-up with your pharmacy.

## 2021-10-11 NOTE — Telephone Encounter (Signed)
Requested Prescriptions  Pending Prescriptions Disp Refills  . escitalopram (LEXAPRO) 20 MG tablet 90 tablet 0    Sig: Take 1 tablet (20 mg total) by mouth daily.     Psychiatry:  Antidepressants - SSRI Passed - 10/11/2021 11:30 AM      Passed - Completed PHQ-2 or PHQ-9 in the last 360 days      Passed - Valid encounter within last 6 months    Recent Outpatient Visits          2 weeks ago History of right knee surgery   [facility] Steele Sizer, MD   3 weeks ago Stage 3a chronic kidney disease [facility])   [facility] Steele Sizer, MD   4 months ago Closed nondisplaced longitudinal fracture of right patella, initial encounter   [facility] [place], Earley Abide, MD   4 months ago Primary osteoarthritis of left knee   [facility], Earley Abide, MD   4 months ago Morbid obesity with BMI of 50.0-59.9, adult [facility])   [facility] Steele Sizer, MD      Future Appointments            In 2 months Ancil Boozer, Drue Stager, MD [facility]

## 2021-10-12 MED ORDER — BUPROPION HCL ER (XL) 300 MG PO TB24: 300.0000 mg | ORAL_TABLET | Freq: Every morning | ORAL | 0 refills | Status: DC

## 2021-10-12 NOTE — Telephone Encounter (Signed)
I have sent Wellbutrin 300 mg to [facility]-90 days supply.

## 2021-10-12 NOTE — Telephone Encounter (Signed)
pt called states she needs the wellburtin [facility] for a 90 day supply if she get it there for a 90 day is 0 but if she geta a 30 day supply at [facility] is $30.

## 2021-10-12 NOTE — Telephone Encounter (Signed)
Pt.notified

## 2021-10-15 ENCOUNTER — Ambulatory Visit
Admission: RE | Admit: 2021-10-15 | Discharge: 2021-10-15 | Disposition: A | Discharge status: Home / Self Care | Payer: PRIVATE HEALTH INSURANCE | Source: Ambulatory Visit | Attending: Orthopedic Surgery | Admitting: Orthopedic Surgery

## 2021-10-15 DIAGNOSIS — S83249A Other tear of medial meniscus, current injury, unspecified knee, initial encounter: Secondary | ICD-10-CM | POA: Diagnosis present

## 2021-10-20 ENCOUNTER — Telehealth: Payer: Self-pay | Admitting: Psychiatry

## 2021-10-20 NOTE — Telephone Encounter (Signed)
I have reviewed notes from [place] date 12/26/2020 Per Dr. Jamse Arn  Patient with diagnosis of MDD recurrent moderate, PTSD, borderline personality disorder There were no records of any medications in the notes that were sent over.  Patient however was recommended medication management and outpatient psychotherapy.

## 2021-10-30 ENCOUNTER — Ambulatory Visit (INDEPENDENT_AMBULATORY_CARE_PROVIDER_SITE_OTHER): Payer: Self-pay | Admitting: Licensed Clinical Social Worker

## 2021-10-30 DIAGNOSIS — F411 Generalized anxiety disorder: Secondary | ICD-10-CM

## 2021-10-30 DIAGNOSIS — F4310 Post-traumatic stress disorder, unspecified: Secondary | ICD-10-CM

## 2021-11-01 ENCOUNTER — Encounter (HOSPITAL_COMMUNITY): Payer: Self-pay
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
## 2021-11-01 NOTE — Plan of Care (Signed)
  Problem: Depression  Goal:  Decrease depressive symptoms and improve levels of effective functioning-pt reports a decrease in overall depression symptoms 3 out of 5 sessions documented.  Outcome: Initial Goal: Develop healthy thinking patterns and beliefs about self, others, and the world that lead to the alleviation and help prevent the relapse of depression per self report 3 out of 5 sessions documented.   Outcome: Initial   Problem: Anxiety  Goal:  Reduce overall frequency, intensity, and duration of the anxiety so that daily functioning is not impaired per pt self report 3 out of 5 sessions documented.   Outcome: Initial Goal: Learn and implement coping skills that result in a reduction of anxiety and worry, and improve daily functioning per pt report 3 out of 5 sessions documented  Outcome: Initial   Developed/revised tx plan based on pt self reported input. Pt verbally agrees with treatment plan at time of session

## 2021-12-14 ENCOUNTER — Encounter (HOSPITAL_COMMUNITY): Payer: Self-pay | Admitting: Licensed Clinical Social Worker

## 2021-12-14 ENCOUNTER — Ambulatory Visit (INDEPENDENT_AMBULATORY_CARE_PROVIDER_SITE_OTHER): Payer: Medicaid Other | Admitting: Licensed Clinical Social Worker

## 2021-12-14 DIAGNOSIS — F32A Depression, unspecified: Secondary | ICD-10-CM

## 2021-12-14 DIAGNOSIS — T148XXA Other injury of unspecified body region, initial encounter: Secondary | ICD-10-CM | POA: Diagnosis not present

## 2021-12-14 DIAGNOSIS — F1011 Alcohol abuse, in remission: Secondary | ICD-10-CM

## 2021-12-14 DIAGNOSIS — F4310 Post-traumatic stress disorder, unspecified: Secondary | ICD-10-CM

## 2021-12-14 NOTE — Progress Notes (Addendum)
Name: Barbara Pennington   MRN: [id number]    DOB: July 13, 1980   Date:12/15/2021       Progress Note  Subjective  Chief Complaint  Follow Up  HPI  Morbid Obesity: her weight was 180 lbs when she graduated HS, when she graduated college/after birth of her first child her weight was up 256 lbs (January 2004 after his birth), she lost weight between 2009-2010 due to severe depression down to around 180 lbs, she gradually started to gain weight again and stay around 220 lbs around 216 lbs, but had plantar fascitis and had surgery so she gained weight after that. In 2019 she injury her back at work and lack of activity also caused more weight gain.Her heaviest weight is today at 256 lbs  She tried Rybelsus samples 3 mg and was able to lose 7 lbs, GLP-1 agonist would be very beneficial for her.  She has tried metformin in the past and it caused diarrhea  She is morbidly obese with OSA, OA, GERD, CKI, glucose intolerance and dyslipidemia  She has tried [employer] and [employer] ,Rickard Patience She is trying to eat more fruit and vegetables, cutting down on portion size , eating earlier   Metabolic syndrome with obesity: responded well to GLP-1 in the past for pre-diabetes. We gave her Mancel Parsons last visit but [place] wide shortage, we will try a different pharmacy today   HTN/ Chronic kidney disease:She states she is following up with Nephrology for monitoring. She is compliant with medication, taking norvasc 2.5 mg and valsartan hctz 160/12.5 mg daily , no side effects , BP is still elevated , we will adjust dose of valsartan to 320 mg  History of right knee surgery: done 07/17, had right meniscal repair and patella chondroplasty. She is finally feeling better, able to walk 1 mile yesterday with minimal pain   Chronic back pain: she states standing causes increase of pain, even sitting has to shift positions frequently. She had a stand up desk, but was given to another employee. Aching pain, worse on right  lower back, no radiculitis. Taking skelaxin daily, explained GFR dropped and cannot take nsaids  CKI : stage III,r eminded her to not take nsaid's, we will recheck labs today. Good urine output   Stress incontinence: going on for about one year, happens when she coughs or sneezes or even when laughing , she is wiling to have PT   Depression/GAD/personality disorder: under the care of Dr. Shea Evans, compliant with medications and states feeling well this week  Asthma mild intermittent: she has noticed increase in cough and sob since neighbors smoke cigarettes and pot. We changed to symbicort but she states she prefers going back to albuterol   OSA: she has been using CPAP , wakes up with a headache intermittently   Patient Active Problem List   Diagnosis Date Noted   Adjustment disorder with depressed mood 06/13/2021   Closed nondisplaced longitudinal fracture of right patella 06/09/2021   GAD (generalized anxiety disorder) 05/05/2021   Depressive disorder 05/05/2021   History of borderline personality disorder 05/05/2021   History of alcohol abuse 05/05/2021   Internal derangement of left knee 04/03/2021   Primary osteoarthritis of left knee 73/41/9379   Metabolic syndrome 02/40/9735   Impaired fasting glucose 12/06/2020   Lymphedema 12/06/2020   Moderate episode of recurrent major depressive disorder ([place]) 02/08/2020   CKD (chronic kidney disease) stage 3, GFR 30-59 ml/min (HCC) 06/11/2019   PTSD (post-traumatic stress disorder) 01/14/2019  Borderline personality disorder ([place]) 01/14/2019   Anxiety 01/14/2019   Mixed hyperlipidemia 01/14/2019   Morbid obesity with BMI of 50.0-59.9, adult ([place]) 01/14/2019   OSA on CPAP 01/14/2019   Asthma, mild persistent 04/30/2018   Low back pain 08/20/2017   Fibroadenoma of breast, 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   Essential hypertension     Past Surgical History:  Procedure Laterality Date   ABDOMINAL HYSTERECTOMY  01/08/2006   BREAST BIOPSY Left  01/09/2011   CORE - FIBROADENOMA VS PHYLLODES   CESAREAN SECTION  01/08/2005   COLONOSCOPY WITH PROPOFOL N/A 10/09/2016   Procedure: COLONOSCOPY WITH PROPOFOL;  Surgeon: Jonathon Bellows, MD;  Location: [facility] ENDOSCOPY;  Service: Gastroenterology;  Laterality: N/A;   COLONOSCOPY WITH PROPOFOL N/A 11/09/2019   Procedure: COLONOSCOPY WITH PROPOFOL;  Surgeon: Lin Landsman, MD;  Location: [facility] ENDOSCOPY;  Service: Gastroenterology;  Laterality: N/A;   ESOPHAGOGASTRODUODENOSCOPY     GUM SURGERY  01/08/2002   KNEE ARTHROSCOPY WITH MEDIAL MENISECTOMY Right 07/24/2021   Procedure: Right medial meniscus root repair and patella chondroplasty;  Surgeon: Leim Fabry, MD;  Location: [facility] ORS;  Service: Orthopedics;  Laterality: Right;   PLANTAR FASCIA RELEASE Left 08/19/2014   Procedure: ENDOSCOPIC PLANTAR FASCIOTOMY RELEASE L WITH TOPAZ;  Surgeon: Albertine Patricia, DPM;  Location: [place];  Service: Podiatry;  Laterality: Left;  LMA WITH POPLITEAL BLOCK   TONSILLECTOMY  01/08/1982    Family History  Problem Relation Age of Onset   Obesity Mother    Diabetes Mother    High Cholesterol Mother    Hypertension Mother    Neuropathy Mother    Depression Mother    Anxiety disorder Mother    Obesity Father    Hypertension Father    High Cholesterol Father    Depression Father    Anxiety disorder Father    Testicular cancer Brother    Mental illness Maternal Aunt    Hypertension Maternal Grandfather    High Cholesterol Maternal Grandfather    Heart disease Maternal Grandfather        has a pacemaker   Stroke Maternal Grandfather    Breast cancer Maternal Grandmother    Hypertension Maternal Grandmother    Aneurysm Maternal Grandmother    Stroke Maternal Grandmother    Colon cancer Paternal Grandfather    Alzheimer's disease Paternal Grandmother    Dementia Paternal Grandmother    Suicidality Cousin     Social History   Tobacco Use   Smoking status: Former    Packs/day: 0.25     Years: 10.00    Total pack years: 2.50    Types: Cigarettes    Quit date: 07/05/2014    Years since quitting: 7.4   Smokeless tobacco: Never  Substance Use Topics   Alcohol use: Yes    Comment: rare     Current Outpatient Medications:    acetaminophen (TYLENOL) 500 MG tablet, Take 2 tablets (1,000 mg total) by mouth every 8 (eight) hours., Disp: 90 tablet, Rfl: 2   albuterol (VENTOLIN HFA) 108 (90 Base) MCG/ACT inhaler, INHALE 2 PUFFS BY MOUTH EVERY 6 HOURS AS NEEDED FOR WHEEZING AND FOR SHORTNESS OF BREATH, Disp: 9 g, Rfl: 0   amLODipine (NORVASC) 2.5 MG tablet, Take 1 tablet (2.5 mg total) by mouth every morning., Disp: 90 tablet, Rfl: 1   atorvastatin (LIPITOR) 20 MG tablet, Take 1 tablet (20 mg total) by mouth every morning., Disp: 90 tablet, Rfl: 1   buPROPion (WELLBUTRIN XL) 300 MG 24 hr tablet, Take 1 tablet (  300 mg total) by mouth every morning., Disp: 90 tablet, Rfl: 0   busPIRone (BUSPAR) 15 MG tablet, Take 1 tablet by mouth twice daily, Disp: 180 tablet, Rfl: 0   escitalopram (LEXAPRO) 20 MG tablet, Take 1 tablet (20 mg total) by mouth daily., Disp: 90 tablet, Rfl: 0   gabapentin (NEURONTIN) 300 MG capsule, Take 1 capsule by mouth 3 (three) times daily., Disp: , Rfl:    hydrOXYzine (ATARAX) 50 MG tablet, Take 1 tablet (50 mg total) by mouth 2 (two) times daily., Disp: 180 tablet, Rfl: 0   Melatonin 10 MG CAPS, Take 10-20 mg by mouth at bedtime., Disp: , Rfl:    metaxalone (SKELAXIN) 800 MG tablet, Take 1 tablet (800 mg total) by mouth 3 (three) times daily as needed for muscle spasms., Disp: 90 tablet, Rfl: 2   montelukast (SINGULAIR) 10 MG tablet, Take 1 tablet (10 mg total) by mouth at bedtime., Disp: 90 tablet, Rfl: 1   valsartan-hydrochlorothiazide (DIOVAN-HCT) 160-12.5 MG tablet, Take 1 tablet by mouth daily., Disp: 90 tablet, Rfl: 0   budesonide-formoterol (SYMBICORT) 160-4.5 MCG/ACT inhaler, Inhale 2 puffs into the lungs 2 (two) times daily. (Patient not taking:  Reported on 12/15/2021), Disp: 3 each, Rfl: 0   ibuprofen (ADVIL) 800 MG tablet, Take 800 mg by mouth every 8 (eight) hours as needed. (Patient not taking: Reported on 12/15/2021), Disp: , Rfl:    Semaglutide-Weight Management (WEGOVY) 0.25 MG/0.5ML SOAJ, Inject 0.25 mg into the skin once a week. (Patient not taking: Reported on 12/15/2021), Disp: 2 mL, Rfl: 0   Semaglutide-Weight Management (WEGOVY) 0.5 MG/0.5ML SOAJ, Inject 0.5 mg into the skin once a week. (Patient not taking: Reported on 12/15/2021), Disp: 2 mL, Rfl: 0   Semaglutide-Weight Management (WEGOVY) 1 MG/0.5ML SOAJ, Inject 1 mg into the skin once a week. (Patient not taking: Reported on 12/15/2021), Disp: 2 mL, Rfl: 0  Allergies  Allergen Reactions   Latex Rash   Metformin And Related     diarrhea    I personally reviewed active problem list, medication list, allergies, family history, social history, health maintenance with the patient/caregiver today.   ROS  Constitutional: Negative for fever , positive for weight change.  Respiratory: Negative for cough and shortness of breath.   Cardiovascular: Negative for chest pain or palpitations.  Gastrointestinal: Negative for abdominal pain, no bowel changes.  Musculoskeletal: positive for gait problem or joint swelling.  Skin: Negative for rash.  Neurological: Negative for dizziness or headache.  No other specific complaints in a complete review of systems (except as listed in HPI above).   Objective  Vitals:   12/15/21 0743  BP: 132/80  Pulse: (!) 102  Resp: 16  SpO2: 98%  Weight: (!) 356 lb (161.5 kg)  Height: '5\' 4"'$  (1.626 m)    Body mass index is 61.11 kg/m.  Physical Exam  Constitutional: Patient appears well-developed and well-nourished. Obese  No distress.  HEENT: head atraumatic, normocephalic, pupils equal and reactive to light, neck supple Cardiovascular: Normal rate, regular rhythm and normal heart sounds.  No murmur heard. No BLE edema. Pulmonary/Chest:  Effort normal and breath sounds normal. No respiratory distress. Abdominal: Soft.  There is no tenderness. Psychiatric: Patient has a normal mood and affect. behavior is normal. Judgment and thought content normal.    PHQ2/9:    12/15/2021    7:42 AM 11/01/2021    7:58 AM 10/03/2021   10:10 AM 09/25/2021   11:31 AM 09/14/2021    8:55 AM  Depression  screen PHQ 2/9  Decreased Interest 1   0 1  Down, Depressed, Hopeless 1   0 0  PHQ - 2 Score 2   0 1  Altered sleeping '2   3 2  '$ Tired, decreased energy '2   3 2  '$ Change in appetite 0   0 0  Feeling bad or failure about yourself  1   0 0  Trouble concentrating 3   0 0  Moving slowly or fidgety/restless 0   0 0  Suicidal thoughts 0   0 0  PHQ-9 Score '10   6 5  '$ Difficult doing work/chores          Information is confidential and restricted. Go to Review Flowsheets to unlock data.    phq 9 is positive   Fall Risk:    12/15/2021    7:42 AM 09/25/2021   11:30 AM 09/14/2021    8:55 AM 05/30/2021    8:44 AM 05/26/2021   12:15 PM  Fall Risk   Falls in the past year? '1 1 1 1 1  '$ Number falls in past yr: '1 1 1 1 1  '$ Injury with Fall? 0 '1 1 1 1  '$ Risk for fall due to : No Fall Risks No Fall Risks No Fall Risks  History of fall(s)  Follow up Falls prevention discussed Falls prevention discussed Falls prevention discussed  Falls prevention discussed      Functional Status Survey: Is the patient deaf or have difficulty hearing?: Yes Does the patient have difficulty seeing, even when wearing glasses/contacts?: Yes Does the patient have difficulty concentrating, remembering, or making decisions?: Yes Does the patient have difficulty walking or climbing stairs?: Yes Does the patient have difficulty dressing or bathing?: Yes Does the patient have difficulty doing errands alone such as visiting a doctor's office or shopping?: No    Assessment & Plan  1. Stage 3a chronic kidney disease (HCC)  - valsartan-hydrochlorothiazide (DIOVAN-HCT)  320-12.5 MG tablet; Take 1 tablet by mouth daily.  Dispense: 90 tablet; Refill: 0 - CBC with Differential/Platelet - COMPLETE METABOLIC PANEL WITH GFR  2. Morbid obesity with BMI of 50.0-59.9, adult (HCC)  - Semaglutide-Weight Management (WEGOVY) 0.25 MG/0.5ML SOAJ; Inject 0.25 mg into the skin once a week.  Dispense: 2 mL; Refill: 0  3. Chronic bilateral low back pain without sciatica  - metaxalone (SKELAXIN) 800 MG tablet; Take 1 tablet (800 mg total) by mouth 3 (three) times daily as needed for muscle spasms.  Dispense: 270 tablet; Refill: 0  4. GAD (generalized anxiety disorder)   5. Mild persistent asthma without complication  - albuterol (VENTOLIN HFA) 108 (90 Base) MCG/ACT inhaler; Inhale 2 puffs into the lungs every 4 (four) hours as needed for wheezing or shortness of breath.  Dispense: 9 g; Refill: 0  6. History of borderline personality disorder  Keep follow up with Dr. Shea Evans  7. Essential hypertension  - valsartan-hydrochlorothiazide (DIOVAN-HCT) 320-12.5 MG tablet; Take 1 tablet by mouth daily.  Dispense: 90 tablet; Refill: 0  8. Weight gain  - TSH  9. Primary osteoarthritis of left knee   10. Hyperglycemia  - Hemoglobin A1c  11. Mixed hyperlipidemia  - Lipid panel  12. Stress incontinence  - Ambulatory referral to Physical Therapy

## 2021-12-15 ENCOUNTER — Encounter: Payer: Self-pay | Admitting: Family Medicine

## 2021-12-15 ENCOUNTER — Ambulatory Visit (INDEPENDENT_AMBULATORY_CARE_PROVIDER_SITE_OTHER): Payer: PRIVATE HEALTH INSURANCE | Admitting: Family Medicine

## 2021-12-15 VITALS — BP 132/80 | HR 102 | Resp 16 | Ht 64.0 in | Wt 356.0 lb

## 2021-12-15 DIAGNOSIS — N1831 Chronic kidney disease, stage 3a: Secondary | ICD-10-CM

## 2021-12-15 DIAGNOSIS — M545 Low back pain, unspecified: Secondary | ICD-10-CM | POA: Diagnosis not present

## 2021-12-15 DIAGNOSIS — R739 Hyperglycemia, unspecified: Secondary | ICD-10-CM

## 2021-12-15 DIAGNOSIS — E782 Mixed hyperlipidemia: Secondary | ICD-10-CM

## 2021-12-15 DIAGNOSIS — Z6841 Body Mass Index (BMI) 40.0 and over, adult: Secondary | ICD-10-CM

## 2021-12-15 DIAGNOSIS — J4530 Mild persistent asthma, uncomplicated: Secondary | ICD-10-CM

## 2021-12-15 DIAGNOSIS — M1712 Unilateral primary osteoarthritis, left knee: Secondary | ICD-10-CM

## 2021-12-15 DIAGNOSIS — Z8659 Personal history of other mental and behavioral disorders: Secondary | ICD-10-CM

## 2021-12-15 DIAGNOSIS — F411 Generalized anxiety disorder: Secondary | ICD-10-CM | POA: Diagnosis not present

## 2021-12-15 DIAGNOSIS — G8929 Other chronic pain: Secondary | ICD-10-CM

## 2021-12-15 DIAGNOSIS — R635 Abnormal weight gain: Secondary | ICD-10-CM

## 2021-12-15 DIAGNOSIS — I10 Essential (primary) hypertension: Secondary | ICD-10-CM

## 2021-12-15 DIAGNOSIS — N393 Stress incontinence (female) (male): Secondary | ICD-10-CM

## 2021-12-15 MED ORDER — ALBUTEROL SULFATE HFA 108 (90 BASE) MCG/ACT IN AERS: 2.0000 | INHALATION_SPRAY | RESPIRATORY_TRACT | 0 refills | Status: DC | PRN

## 2021-12-15 MED ORDER — ALBUTEROL SULFATE HFA 108 (90 BASE) MCG/ACT IN AERS
2.0000 | INHALATION_SPRAY | RESPIRATORY_TRACT | 0 refills | Status: DC | PRN
Start: 2021-12-15 — End: 2021-12-15

## 2021-12-15 MED ORDER — WEGOVY 0.25 MG/0.5ML ~~LOC~~ SOAJ: 0.2500 mg | SUBCUTANEOUS | 0 refills | Status: DC

## 2021-12-15 MED ORDER — VALSARTAN-HYDROCHLOROTHIAZIDE 320-12.5 MG PO TABS: 1.0000 | ORAL_TABLET | Freq: Every day | ORAL | 0 refills | Status: DC

## 2021-12-15 MED ORDER — METAXALONE 800 MG PO TABS: 800.0000 mg | ORAL_TABLET | Freq: Three times a day (TID) | ORAL | 2 refills | Status: DC | PRN

## 2021-12-15 MED ORDER — METAXALONE 800 MG PO TABS: 800.0000 mg | ORAL_TABLET | Freq: Three times a day (TID) | ORAL | 0 refills | Status: DC | PRN

## 2021-12-15 NOTE — Progress Notes (Signed)
Virtual Visit via Video Note  I connected with Tiarra L Stanis on 12/15/21 at  4:00 PM EST by a video enabled telemedicine application and verified that I am speaking with the correct person using two identifiers.  Location: Patient: home Provider: remote office [place], [place])   I discussed the limitations of evaluation and management by telemedicine and the availability of in person appointments. The patient expressed understanding and agreed to proceed.  I discussed the assessment and treatment plan with the patient. The patient was provided an opportunity to ask questions and all were answered. The patient agreed with the plan and demonstrated an understanding of the instructions.   The patient was advised to call back or seek an in-person evaluation if the symptoms worsen or if the condition fails to improve as anticipated.  I provided 45 minutes of non-face-to-face time during this encounter.   Evolette Pendell R Holleigh Crihfield, LCSW   THERAPIST PROGRESS NOTE  Session Time: 4-445p  Participation Level: Active  Behavioral Response: Neat and Well GroomedAlertDepressed  Type of Therapy: Individual Therapy  Treatment Goals addressed:   Depression  Decrease depressive symptoms and improve levels of effective functioning-pt reports a decrease in overall depression symptoms 3 out of 5 sessions documented.   Develop healthy thinking patterns and beliefs about self, others, and the world that lead to the alleviation and help prevent the relapse of depression per self report 3 out of 5 sessions documented.    Anxiety  Reduce overall frequency, intensity, and duration of the anxiety so that daily functioning is not impaired per pt self report 3 out of 5 sessions documented.   Learn and implement coping skills that result in a reduction of anxiety and worry, and improve daily functioning per pt report 3 out of 5 sessions documented    Compulsive behaviors LTG: Solae will reduce or eliminate  obsessive thoughts and images per pt self report 3 out of 5 sessions documented  STG: Annemarie will be able to describe ways obsessive or compulsive behavior interferes with life per pt self report 3 out of 5 sessions documented    ProgressTowards Goals: Progressing  Interventions: CBT, Motivational Interviewing, Supportive, and Other: behavior management (skin picking)  Summary: Mariluz L Wellbrock is a 41 y.o. female who presents with continuing symptoms related to PTSD, depression, skin picking. Pt reports that mood has been stable recently, and pt feels more "in control" of her anxiety/stress at times. Pt is continuing to experience chronic pain, which impacts physical mobility.  Allowed pt to explore and express thoughts and feelings associated with recent life situations and external stressors. Patient reports that she has some good days, and she has some bad days. Patient reports that she has a lot of stress associated with individuals that she works with. 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 Patient reports that she works a lot, and is aware that she does not take the breaks or time off that she needs to maintain a good work life balance. Patient reports that she tries hard to identify stress and anxiety triggers.  Patient reports that her boyfriend is a big support system for her, and she's happy that he is her best friend and boyfriend. Patient reports that they enjoy spending time together cooking, watching TV,  playing cards, and gambling is one of their preferred activities to do together. Patient reports that she finds gambling to be a social activity as well because they often will encounter other regular gamblers and have developed relationships with them.  Facial reports that most of her  family live in [place].   Reviewed patients current coping skills and discussed additional coping skills for management of depression symptoms and anxiety symptoms. Discussed skin picking behavior, and allow patient to identify ways of managing skin picking behaviors in a positive way. Patient reflects understanding and willingness to cooperate.  Assess substance use at next visit.  Continued recommendations are as follows: self care behaviors, positive social engagements, focusing on overall work/home/life balance, and focusing on positive physical and emotional wellness.   Suicidal/Homicidal: No  Therapist Response: Pt is continuing to apply interventions learned in session into daily life situations. Pt is currently on track to meet goals utilizing interventions mentioned above. Personal growth and progress noted. Treatment to continue as indicated.   Plan: Return again in 4 weeks.  Diagnosis:  Encounter Diagnoses  Name Primary?   PTSD (post-traumatic stress disorder) Yes   History of alcohol abuse    Excoriation    Depressive disorder     Collaboration of Care: Patient refused AEB pt is scheduled to start psychiatric treatment with Dr. Ursula Alert . Pt had psychiatric services w/ [facility] in past.   Patient/Guardian was advised Release of Information must be obtained prior to any record release in order to collaborate their care with an outside provider. Patient/Guardian was advised if they have not already done so to contact the registration department to sign all necessary forms in order for [name] to release information regarding their care.   Consent: Patient/Guardian gives verbal consent for treatment and assignment of benefits for services provided during this visit. Patient/Guardian expressed understanding and agreed to proceed.   [place], LCSW 12/15/2021

## 2021-12-15 NOTE — Patient Instructions (Signed)
[facility]

## 2021-12-16 LAB — COMPLETE METABOLIC PANEL WITH GFR
AG Ratio: 1.6 (calc) (ref 1.0–2.5)
ALT: 25 U/L (ref 6–29)
AST: 10 U/L (ref 10–30)
Albumin: 4.3 g/dL (ref 3.6–5.1)
Alkaline phosphatase (APISO): 68 U/L (ref 31–125)
BUN/Creatinine Ratio: 10 (calc) (ref 6–22)
BUN: 12 mg/dL (ref 7–25)
CO2: 33 mmol/L — ABNORMAL HIGH (ref 20–32)
Calcium: 9.8 mg/dL (ref 8.6–10.2)
Chloride: 102 mmol/L (ref 98–110)
Creat: 1.18 mg/dL — ABNORMAL HIGH (ref 0.50–0.99)
Globulin: 2.7 g/dL (calc) (ref 1.9–3.7)
Glucose, Bld: 80 mg/dL (ref 65–99)
Potassium: 4.4 mmol/L (ref 3.5–5.3)
Sodium: 142 mmol/L (ref 135–146)
Total Bilirubin: 0.4 mg/dL (ref 0.2–1.2)
Total Protein: 7 g/dL (ref 6.1–8.1)
eGFR: 59 mL/min/{1.73_m2} — ABNORMAL LOW (ref >=60)

## 2021-12-16 LAB — CBC WITH DIFFERENTIAL/PLATELET
Absolute Monocytes: 744 cells/uL (ref 200–950)
Basophils Absolute: 56 cells/uL (ref 0–200)
Basophils Relative: 0.6 %
Eosinophils Absolute: 288 cells/uL (ref 15–500)
Eosinophils Relative: 3.1 %
HCT: 43.8 % (ref 35.0–45.0)
Hemoglobin: 14.7 g/dL (ref 11.7–15.5)
Lymphs Abs: 2511 cells/uL (ref 850–3900)
MCH: 30.6 pg (ref 27.0–33.0)
MCHC: 33.6 g/dL (ref 32.0–36.0)
MCV: 91.3 fL (ref 80.0–100.0)
MPV: 9.5 fL (ref 7.5–12.5)
Monocytes Relative: 8 %
Neutro Abs: 5701 cells/uL (ref 1500–7800)
Neutrophils Relative %: 61.3 %
Platelets: 280 10*3/uL (ref 140–400)
RBC: 4.8 10*6/uL (ref 3.80–5.10)
RDW: 11.9 % (ref 11.0–15.0)
Total Lymphocyte: 27 %
WBC: 9.3 10*3/uL (ref 3.8–10.8)

## 2021-12-16 LAB — LIPID PANEL
Cholesterol: 186 mg/dL (ref <200)
HDL: 60 mg/dL (ref >=50)
LDL Cholesterol (Calc): 110 mg/dL (calc) — ABNORMAL HIGH
Non-HDL Cholesterol (Calc): 126 mg/dL (calc) (ref <130)
Total CHOL/HDL Ratio: 3.1 (calc) (ref <5.0)
Triglycerides: 73 mg/dL (ref <150)

## 2021-12-16 LAB — HEMOGLOBIN A1C
Hgb A1c MFr Bld: 5.5 % of total Hgb (ref <5.7)
Mean Plasma Glucose: 111 mg/dL
eAG (mmol/L): 6.2 mmol/L

## 2021-12-16 LAB — TSH: TSH: 1.83 mIU/L

## 2021-12-19 ENCOUNTER — Telehealth (INDEPENDENT_AMBULATORY_CARE_PROVIDER_SITE_OTHER): Payer: PRIVATE HEALTH INSURANCE | Admitting: Psychiatry

## 2021-12-19 ENCOUNTER — Encounter: Payer: Self-pay | Admitting: Psychiatry

## 2021-12-19 DIAGNOSIS — F411 Generalized anxiety disorder: Secondary | ICD-10-CM | POA: Diagnosis not present

## 2021-12-19 DIAGNOSIS — F4310 Post-traumatic stress disorder, unspecified: Secondary | ICD-10-CM

## 2021-12-19 DIAGNOSIS — Z8659 Personal history of other mental and behavioral disorders: Secondary | ICD-10-CM | POA: Diagnosis not present

## 2021-12-19 DIAGNOSIS — F424 Excoriation (skin-picking) disorder: Secondary | ICD-10-CM | POA: Diagnosis not present

## 2021-12-19 DIAGNOSIS — F1011 Alcohol abuse, in remission: Secondary | ICD-10-CM

## 2021-12-19 MED ORDER — BUSPIRONE HCL 15 MG PO TABS: 15.0000 mg | ORAL_TABLET | Freq: Three times a day (TID) | ORAL | 0 refills | Status: DC

## 2021-12-19 NOTE — Progress Notes (Signed)
Virtual Visit via Video Note  I connected with Barbara Pennington on 12/19/21 at 10:00 AM EST by a video enabled telemedicine application and verified that I am speaking with the correct person using two identifiers.  Location Provider Location : [facility] Patient Location : Work  Participants: Patient , Provider   I discussed the limitations of evaluation and management by telemedicine and the availability of in person appointments. The patient expressed understanding and agreed to proceed.   I discussed the assessment and treatment plan with the patient. The patient was provided an opportunity to ask questions and all were answered. The patient agreed with the plan and demonstrated an understanding of the instructions.   The patient was advised to call back or seek an in-person evaluation if the symptoms worsen or if the condition fails to improve as anticipated.   [facility] MD OP Progress Note  12/19/2021 2:29 PM Barbara Pennington  MRN:  [id number]  Chief Complaint:  Chief Complaint  Patient presents with   Follow-up   Anxiety   Medication Refill   HPI: Barbara Pennington is a 41 year old Caucasian female, divorced, currently lives in [place], has a history of PTSD, GAD, history of borderline personality disorder, morbid obesity, OSA on CPAP, hypertension/chronic kidney disease, asthma, chronic back pain, status post right sided knee surgery, presented for medication management, was evaluated by telemedicine today.  Patient today reports she is currently struggling with relationship struggles with her boyfriend.  Reports that has been extremely stressful for her.  She hence has been picking her skin a lot.  Patient showed superficial excoriation of her skin, has it on her bilateral thighs, arms, abdomen, buttocks per report.  Patient reports she has been doing it since the past several years.  She reports she goes through episodes when she has flareups and she cannot stop doing it.  She has tried to  replace it with something else to distract herself however it does not work.  Patient does report anxiety regarding her current relationship struggles.  It is a constant stressor for her.  Patient does not have any other social support system.  She reports she has gone back to work and that has been extremely helpful since she can keep herself busy at work.  Patient is agreeable to have more frequent psychotherapy sessions.  She follows up with Ms. Christina Hussami.  Patient reports sleep is overall okay.  Currently compliant on the Wellbutrin, Lexapro, BuSpar.  Denies any suicidality, homicidality or perceptual disturbances.  Patient denies any other concerns today.  Visit Diagnosis:    ICD-10-CM   1. PTSD (post-traumatic stress disorder)  F43.10 busPIRone (BUSPAR) 15 MG tablet    2. GAD (generalized anxiety disorder)  F41.1 busPIRone (BUSPAR) 15 MG tablet    3. 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 History of borderline personality disorder  Z86.59     5. History of alcohol abuse  F10.11       Past Psychiatric History: Reviewed past psychiatric history from progress note on 05/05/2021.  Patient was previously under the care of [facility].  Past Medical History:  Past Medical History:  Diagnosis Date   Anxiety    Arthritis 2006   rhuematoid - mild - no current issues   Asthma 2003   Borderline personality disorder ([place])    Chronic kidney disease    stage 3   Complication of anesthesia    "Usually" has low temp after surgery.After hysterectomy temp was 94   Depression  GERD (gastroesophageal reflux disease)    History of kidney stones    Hyperlipidemia    Hypertension 2013   Lump or mass in breast 2013   RIGHT BREAST   Motion sickness    repeated amusement park rides   PTSD (post-traumatic stress disorder)    Scoliosis    no current issues   Shortness of breath dyspnea    secondary to weight    Sleep apnea    uses cpap   Ulcer 2001    Past Surgical History:   Procedure Laterality Date   ABDOMINAL HYSTERECTOMY  01/08/2006   BREAST BIOPSY Left 01/09/2011   CORE - FIBROADENOMA VS PHYLLODES   CESAREAN SECTION  01/08/2005   COLONOSCOPY WITH PROPOFOL N/A 10/09/2016   Procedure: COLONOSCOPY WITH PROPOFOL;  Surgeon: Jonathon Bellows, MD;  Location: [facility] ENDOSCOPY;  Service: Gastroenterology;  Laterality: N/A;   COLONOSCOPY WITH PROPOFOL N/A 11/09/2019   Procedure: COLONOSCOPY WITH PROPOFOL;  Surgeon: Lin Landsman, MD;  Location: [facility] ENDOSCOPY;  Service: Gastroenterology;  Laterality: N/A;   ESOPHAGOGASTRODUODENOSCOPY     GUM SURGERY  01/08/2002   KNEE ARTHROSCOPY WITH MEDIAL MENISECTOMY Right 07/24/2021   Procedure: Right medial meniscus root repair and patella chondroplasty;  Surgeon: Leim Fabry, MD;  Location: [facility] ORS;  Service: Orthopedics;  Laterality: Right;   PLANTAR FASCIA RELEASE Left 08/19/2014   Procedure: ENDOSCOPIC PLANTAR FASCIOTOMY RELEASE L WITH TOPAZ;  Surgeon: Albertine Patricia, DPM;  Location: [facility];  Service: Podiatry;  Laterality: Left;  LMA WITH POPLITEAL BLOCK   TONSILLECTOMY  01/08/1982    Family Psychiatric History: Reviewed family psychiatric history from progress note on 05/05/2021.  Family History:  Family History  Problem Relation Age of Onset   Obesity Mother    Diabetes Mother    High Cholesterol Mother    Hypertension Mother    Neuropathy Mother    Depression Mother    Anxiety disorder Mother    Obesity Father    Hypertension Father    High Cholesterol Father    Depression Father    Anxiety disorder Father    Seizures Brother    Testicular cancer Brother    Breast cancer Maternal Grandmother    Hypertension Maternal Grandmother    Aneurysm Maternal Grandmother    Stroke Maternal Grandmother    Hypertension Maternal Grandfather    High Cholesterol Maternal Grandfather    Heart disease Maternal Grandfather        has a pacemaker   Stroke Maternal Grandfather    Alzheimer's disease  Paternal Grandmother    Dementia Paternal Grandmother    Colon cancer Paternal Grandfather    Mental illness Maternal Aunt    Suicidality Cousin     Social History: Reviewed social history from progress note on 05/05/2021. Social History   Socioeconomic History   Marital status: Divorced    Spouse name: Not on file   Number of children: 2   Years of education: 14   Highest education level: Bachelor's degree (e.g., BA, AB, BS)  Occupational History   Not on file  Tobacco Use   Smoking status: Former    Packs/day: 0.25    Years: 10.00    Total pack years: 2.50    Types: Cigarettes    Quit date: 07/05/2014    Years since quitting: 7.4   Smokeless tobacco: Never  Vaping Use   Vaping Use: Never used  Substance and Sexual Activity   Alcohol use: Yes    Comment: rare   Drug  use: No   Sexual activity: Yes    Partners: Male    Birth control/protection: Surgical    Comment: 2007  Other Topics Concern   Not on file  Social History Narrative   Not on file   Social Determinants of Health   Financial Resource Strain: High Risk (05/31/2020)   Overall Financial Resource Strain (CARDIA)    Difficulty of Paying Living Expenses: Hard  Food Insecurity: No Food Insecurity (05/31/2020)   Hunger Vital Sign    Worried About Running Out of Food in the Last Year: Never true    Ran Out of Food in the Last Year: Never true  Transportation Needs: No Transportation Needs (05/31/2020)   PRAPARE - Hydrologist (Medical): No    Lack of Transportation (Non-Medical): No  Physical Activity: Unknown (05/31/2020)   Exercise Vital Sign    Days of Exercise per Week: 0 days    Minutes of Exercise per Session: Not on file  Stress: Stress Concern Present (05/31/2020)   [place]    Feeling of Stress : To some extent  Social Connections: Socially Isolated (05/31/2020)   Social Connection and Isolation Panel  [NHANES]    Frequency of Communication with Friends and Family: More than three times a week    Frequency of Social Gatherings with Friends and Family: Never    Attends Religious Services: Never    Marine scientist or Organizations: No    Attends Music therapist: Not on file    Marital Status: Divorced    Allergies:  Allergies  Allergen Reactions   Latex Rash   Metformin And Related     diarrhea    Metabolic Disorder Labs: Lab Results  Component Value Date   HGBA1C 5.5 12/15/2021   MPG 111 12/15/2021   MPG 105 12/06/2020   No results found for: "PROLACTIN" Lab Results  Component Value Date   CHOL 186 12/15/2021   TRIG 73 12/15/2021   HDL 60 12/15/2021   CHOLHDL 3.1 12/15/2021   VLDL 27 08/06/2019   LDLCALC 110 (H) 12/15/2021   LDLCALC 111 (H) 12/06/2020   Lab Results  Component Value Date   TSH 1.83 12/15/2021   TSH 2.59 04/04/2018    Therapeutic Level Labs: No results found for: "LITHIUM" No results found for: "VALPROATE" No results found for: "CBMZ"  Current Medications: Current Outpatient Medications  Medication Sig Dispense Refill   acetaminophen (TYLENOL) 500 MG tablet Take 2 tablets (1,000 mg total) by mouth every 8 (eight) hours. 90 tablet 2   albuterol (VENTOLIN HFA) 108 (90 Base) MCG/ACT inhaler Inhale 2 puffs into the lungs every 4 (four) hours as needed for wheezing or shortness of breath. 9 g 0   amLODipine (NORVASC) 2.5 MG tablet Take 1 tablet (2.5 mg total) by mouth every morning. 90 tablet 1   atorvastatin (LIPITOR) 20 MG tablet Take 1 tablet (20 mg total) by mouth every morning. 90 tablet 1   budesonide-formoterol (SYMBICORT) 160-4.5 MCG/ACT inhaler Inhale 2 puffs into the lungs 2 (two) times daily. 3 each 0   buPROPion (WELLBUTRIN XL) 300 MG 24 hr tablet Take 1 tablet (300 mg total) by mouth every morning. 90 tablet 0   escitalopram (LEXAPRO) 20 MG tablet Take 1 tablet (20 mg total) by mouth daily. 90 tablet 0   gabapentin  (NEURONTIN) 300 MG capsule Take 1 capsule by mouth 3 (three) times daily.  hydrOXYzine (ATARAX) 50 MG tablet Take 1 tablet (50 mg total) by mouth 2 (two) times daily. 180 tablet 0   Melatonin 10 MG CAPS Take 10-20 mg by mouth at bedtime.     metaxalone (SKELAXIN) 800 MG tablet Take 1 tablet (800 mg total) by mouth 3 (three) times daily as needed for muscle spasms. 270 tablet 0   montelukast (SINGULAIR) 10 MG tablet Take 1 tablet (10 mg total) by mouth at bedtime. 90 tablet 1   valsartan-hydrochlorothiazide (DIOVAN-HCT) 320-12.5 MG tablet Take 1 tablet by mouth daily. 90 tablet 0   busPIRone (BUSPAR) 15 MG tablet Take 1 tablet (15 mg total) by mouth 3 (three) times daily. 270 tablet 0   Semaglutide-Weight Management (WEGOVY) 0.25 MG/0.5ML SOAJ Inject 0.25 mg into the skin once a week. (Patient not taking: Reported on 12/19/2021) 2 mL 0   Semaglutide-Weight Management (WEGOVY) 0.5 MG/0.5ML SOAJ Inject 0.5 mg into the skin once a week. (Patient not taking: Reported on 12/15/2021) 2 mL 0   Semaglutide-Weight Management (WEGOVY) 1 MG/0.5ML SOAJ Inject 1 mg into the skin once a week. (Patient not taking: Reported on 12/15/2021) 2 mL 0   No current facility-administered medications for this visit.     Musculoskeletal: Strength & Muscle Tone:  UTA Gait & Station:  Seated Patient leans: N/A  Psychiatric Specialty Exam: Review of Systems  Skin:        Superficial excoriations, thighs, arms, buttocks , abdomen- due to picking  Psychiatric/Behavioral:  The patient is nervous/anxious.   All other systems reviewed and are negative.   There were no vitals taken for this visit.There is no height or weight on file to calculate BMI.  General Appearance: Casual  Eye Contact:  Good  Speech:  Clear and Coherent  Volume:  Normal  Mood:  Anxious  Affect:  Congruent  Thought Process:  Goal Directed and Descriptions of Associations: Intact  Orientation:  Full (Time, Place, and Person)  Thought Content:  Logical   Suicidal Thoughts:  No  Homicidal Thoughts:  No  Memory:  Immediate;   Fair Recent;   Fair Remote;   Fair  Judgement:  Fair  Insight:  Fair  Psychomotor Activity:  Normal  Concentration:  Concentration: Fair and Attention Span: Fair  Recall:  [employer] of Knowledge: Fair  Language: Fair  Akathisia:  No  Handed:  Right  AIMS (if indicated): not done  Assets:  Communication Skills Desire for Improvement Housing Social Support  ADL's:  Intact  Cognition: WNL  Sleep:  Fair   Screenings: AIMS    Flowsheet Row Video Visit from 06/13/2021 in [place] Office Visit from 05/05/2021 in [place] Total Score 0 0      GAD-7    Flowsheet Row Counselor from 10/30/2021 in [place] Office Visit from 05/30/2021 in [place] [facility] [place] from 05/26/2021 in [facility] Office Visit from 05/05/2021 in [place] Office Visit from 04/03/2021 in [place] [facility] at [facility]  Total GAD-7 Score '21 11 7 17 7      '$ PHQ2-9    Flowsheet Row Video Visit from 12/19/2021 in [place] Office Visit from 12/15/2021 in [facility] Counselor from 10/30/2021 in [place] Video Visit from 10/03/2021 in [place] Office Visit from 09/25/2021 in [facility]  PHQ-2 Total Score '1 2 6 '$ 0 0  PHQ-9 Total Score -- '10 24 7 6      '$ Flowsheet Row Video Visit from 12/19/2021 in [place] Counselor from 10/30/2021 in [place] Video Visit from 10/03/2021 in [place]  C-SSRS RISK CATEGORY Low Risk Error: Q3, 4, or 5 should not be populated when  Q2 is No Low Risk        Assessment and Plan: Barbara Pennington is a 41 year old Caucasian female with history of PTSD, borderline personality disorder, depression, GAD was evaluated by telemedicine today.  Patient is currently struggling with skin picking, anxiety mostly due to relationship struggles, will benefit from the following plan.  Plan PTSD-improving Lexapro 20 mg p.o. daily Continue Wellbutrin XL 300 mg p.o. daily. Patient to continue CBT with Ms. Christina Hussami  GAD-unstable Increase BuSpar to 15 mg p.o. 3 times daily Lexapro 20 mg p.o. daily Gabapentin 300 mg p.o. 3 times daily prescribed by her  provider for pain. Continues DBT  Skin picking disorder-unstable Patient advised to have more frequent psychotherapy sessions. Continue hydroxyzine 50 mg p.o. twice daily.  She could also use it as 100 mg at bedtime if it makes her drowsy. Increase BuSpar to 15 mg p.o. 3 times daily  History of personality disorder-reviewed records from [facility]-patient with history of MDD, PTSD.  History of alcohol abuse-patient has been sober since 2017.    Collaboration of Care: Collaboration of Care: Referral or follow-up with counselor/therapist AEB encouraged to follow up with therapist  Patient/Guardian was advised Release of Information must be obtained prior to any record release in order to collaborate their care with an outside provider. Patient/Guardian was advised if they have not already done so to contact the registration department to sign all necessary forms in order for [name] to release information regarding their care.   Consent: Patient/Guardian gives verbal consent for treatment and assignment of benefits for services provided during this visit. Patient/Guardian expressed understanding and agreed to proceed.   This note was generated in part or whole with voice recognition software. Voice recognition is usually quite accurate but there are transcription errors that can and very  often do occur. I apologize for any typographical errors that were not detected and corrected.      Ursula Alert, MD 12/19/2021, 2:29 PM

## 2021-12-23 ENCOUNTER — Other Ambulatory Visit: Payer: Self-pay | Admitting: Family Medicine

## 2021-12-23 DIAGNOSIS — J4530 Mild persistent asthma, uncomplicated: Secondary | ICD-10-CM

## 2022-01-02 ENCOUNTER — Telehealth: Payer: PRIVATE HEALTH INSURANCE | Admitting: Nurse Practitioner

## 2022-01-02 ENCOUNTER — Telehealth (INDEPENDENT_AMBULATORY_CARE_PROVIDER_SITE_OTHER): Payer: PRIVATE HEALTH INSURANCE | Admitting: Family Medicine

## 2022-01-02 ENCOUNTER — Encounter: Payer: Self-pay | Admitting: Family Medicine

## 2022-01-02 ENCOUNTER — Ambulatory Visit: Payer: Self-pay | Admitting: *Deleted

## 2022-01-02 VITALS — Ht 64.0 in | Wt 356.0 lb

## 2022-01-02 DIAGNOSIS — J4541 Moderate persistent asthma with (acute) exacerbation: Secondary | ICD-10-CM | POA: Diagnosis not present

## 2022-01-02 MED ORDER — HYDROCOD POLI-CHLORPHE POLI ER 10-8 MG/5ML PO SUER: 5.0000 mL | Freq: Every evening | ORAL | 0 refills | Status: DC | PRN

## 2022-01-02 MED ORDER — AZITHROMYCIN 250 MG PO TABS: ORAL_TABLET | ORAL | 0 refills | Status: DC

## 2022-01-02 MED ORDER — PREDNISONE 20 MG PO TABS: ORAL_TABLET | ORAL | 0 refills | Status: DC

## 2022-01-02 MED ORDER — NEBULIZER/TUBING/MOUTHPIECE KIT: PACK | 0 refills | Status: DC

## 2022-01-02 MED ORDER — GUAIFENESIN ER 600 MG PO TB12: 600.0000 mg | ORAL_TABLET | Freq: Two times a day (BID) | ORAL | 0 refills | Status: DC | PRN

## 2022-01-02 MED ORDER — IPRATROPIUM-ALBUTEROL 0.5-2.5 (3) MG/3ML IN SOLN: 3.0000 mL | Freq: Three times a day (TID) | RESPIRATORY_TRACT | 1 refills | Status: DC | PRN

## 2022-01-02 NOTE — Telephone Encounter (Signed)
  Chief Complaint: Cough Symptoms: Dry cough, chest congestion, SOB with coughing, exertion, mild headache. Using inhalers, helps. Has tried multiple OTC meds, ineffective. Frequency: Thursday Pertinent Negatives: Patient denies fever Disposition: '[]'$ ED /'[]'$ Urgent Care (no appt availability in office) / '[x]'$ Appointment(In office/virtual)/ '[]'$  [facility]/ '[]'$ Home Care/ '[]'$ Refused Recommended Disposition /'[]'$ [facility]/ '[]'$  Follow-up with PCP Additional Notes: Virtual appt secured with Leisa this AM. Care advise provided, t verbalizes understanding.  Reason for Disposition  [1] MILD difficulty breathing (e.g., minimal/no SOB at rest, SOB with walking, pulse <100) AND [2] still present when not coughing  Answer Assessment - Initial Assessment Questions 1. ONSET: "When did the cough begin?"      Thursday 2. SEVERITY: "How bad is the cough today?"      Awake 3. SPUTUM: "Describe the color of your sputum" (none, dry cough; clear, white, yellow, green)     Dry 4. HEMOPTYSIS: "Are you coughing up any blood?" If so ask: "How much?" (flecks, streaks, tablespoons, etc.)     No 5. DIFFICULTY BREATHING: "Are you having difficulty breathing?" If Yes, ask: "How bad is it?" (e.g., mild, moderate, severe)    - MILD: No SOB at rest, mild SOB with walking, speaks normally in sentences, can lie down, no retractions, pulse < 100.    - MODERATE: SOB at rest, SOB with minimal exertion and prefers to sit, cannot lie down flat, speaks in phrases, mild retractions, audible wheezing, pulse 100-120.    - SEVERE: Very SOB at rest, speaks in single words, struggling to breathe, sitting hunched forward, retractions, pulse > 120      With exertion 6. FEVER: "Do you have a fever?" If Yes, ask: "What is your temperature, how was it measured, and when did it start?"     no 7. CARDIAC HISTORY: "Do you have any history of heart disease?" (e.g., heart attack, congestive heart failure)      no 8. LUNG HISTORY:  "Do you have any history of lung disease?"  (e.g., pulmonary embolus, asthma, emphysema)     Asthma 9. PE RISK FACTORS: "Do you have a history of blood clots?" (or: recent major surgery, recent prolonged travel, bedridden)     no 10. OTHER SYMPTOMS: "Do you have any other symptoms?" (e.g., runny nose, wheezing, chest pain)       Headache with coughing, using inhalers  Protocols used: Cough - Acute Non-Productive-A-AH

## 2022-01-02 NOTE — Progress Notes (Signed)
Name: Barbara Pennington   MRN: [id number]    DOB: May 24, 1980   Date:01/02/2022       Progress Note  Subjective:    Chief Complaint  Chief Complaint  Patient presents with   Cough    X5 days, non-productive.     I connected with  Barbara Pennington  on 01/02/22 at 10:20 AM EST by a video enabled telemedicine application and verified that I am speaking with the correct person using two identifiers.  I discussed the limitations of evaluation and management by telemedicine and the availability of in person appointments. The patient expressed understanding and agreed to proceed. Staff also discussed with the patient that there may be a patient responsible charge related to this service. 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 Additional Individuals present: noen  HPI Pt presents with cough x 5+ days Dayquil tylenol cold and flu, daytime, humidifier, steam from shower Nonproductive cough,  Hx of asthma  COVID test at home x 2 neg She has used her inhalers symbicort and albuterol She feels some body aches, fatigue, nighttime sx are much more severe - nothing has helped Coughing fits, tightness Denies cp with inspiration, but coughing fits do hurt chest and cause HA - feels like a lot of pressure/head gonna explode    Patient Active Problem List   Diagnosis Date Noted   Skin-picking disorder 12/19/2021   Adjustment disorder with depressed mood 06/13/2021   Closed nondisplaced longitudinal fracture of right patella 06/09/2021   GAD (generalized anxiety disorder) 05/05/2021   Depressive disorder 05/05/2021   History of borderline personality disorder 05/05/2021   History of alcohol abuse 05/05/2021   Internal derangement of left knee 04/03/2021   Primary osteoarthritis of left knee 80/32/1224   Metabolic syndrome 82/50/0370   Impaired fasting glucose 12/06/2020   Lymphedema 12/06/2020   Moderate episode of recurrent major depressive disorder ([place]) 02/08/2020   CKD  (chronic kidney disease) stage 3, GFR 30-59 ml/min ([place]) 06/11/2019   PTSD (post-traumatic stress disorder) 01/14/2019   Borderline personality disorder ([place]) 01/14/2019   Anxiety 01/14/2019   Mixed hyperlipidemia 01/14/2019   Morbid obesity with BMI of 50.0-59.9, adult ([place]) 01/14/2019   OSA on CPAP 01/14/2019   Asthma, mild persistent 04/30/2018   Low back pain 08/20/2017   Fibroadenoma of breast, 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   Essential hypertension     Social History   Tobacco Use   Smoking status: Former    Packs/day: 0.25    Years: 10.00    Total pack years: 2.50    Types: Cigarettes    Quit date: 07/05/2014    Years since quitting: 7.5   Smokeless tobacco: Never  Substance Use Topics   Alcohol use: Yes    Comment: rare     Current Outpatient Medications:    acetaminophen (TYLENOL) 500 MG tablet, Take 2 tablets (1,000 mg total) by mouth every 8 (eight) hours., Disp: 90 tablet, Rfl: 2   albuterol (VENTOLIN HFA) 108 (90 Base) MCG/ACT inhaler, Inhale 2 puffs into the lungs every 4 (four) hours as needed for wheezing or shortness of breath., Disp: 9 g, Rfl: 0   amLODipine (NORVASC) 2.5 MG tablet, Take 1 tablet (2.5 mg total) by mouth every morning., Disp: 90 tablet, Rfl: 1   atorvastatin (LIPITOR) 20 MG tablet, Take 1 tablet (20 mg total) by mouth every morning., Disp: 90 tablet, Rfl: 1   budesonide-formoterol (SYMBICORT) 160-4.5 MCG/ACT inhaler, Inhale 2 puffs into the lungs 2 (two) times daily.,  Disp: 3 each, Rfl: 0   buPROPion (WELLBUTRIN XL) 300 MG 24 hr tablet, Take 1 tablet (300 mg total) by mouth every morning., Disp: 90 tablet, Rfl: 0   busPIRone (BUSPAR) 15 MG tablet, Take 1 tablet (15 mg total) by mouth 3 (three) times daily., Disp: 270 tablet, Rfl: 0   escitalopram (LEXAPRO) 20 MG tablet, Take 1 tablet (20 mg total) by mouth daily., Disp: 90 tablet, Rfl: 0   gabapentin (NEURONTIN) 300 MG capsule, Take 1 capsule by mouth 3 (three) times daily., Disp: , Rfl:    hydrOXYzine  (ATARAX) 50 MG tablet, Take 1 tablet (50 mg total) by mouth 2 (two) times daily., Disp: 180 tablet, Rfl: 0   Melatonin 10 MG CAPS, Take 10-20 mg by mouth at bedtime., Disp: , Rfl:    metaxalone (SKELAXIN) 800 MG tablet, Take 1 tablet (800 mg total) by mouth 3 (three) times daily as needed for muscle spasms., Disp: 270 tablet, Rfl: 0   montelukast (SINGULAIR) 10 MG tablet, Take 1 tablet (10 mg total) by mouth at bedtime., Disp: 90 tablet, Rfl: 1   Semaglutide-Weight Management (WEGOVY) 0.25 MG/0.5ML SOAJ, Inject 0.25 mg into the skin once a week., Disp: 2 mL, Rfl: 0   Semaglutide-Weight Management (WEGOVY) 0.5 MG/0.5ML SOAJ, Inject 0.5 mg into the skin once a week., Disp: 2 mL, Rfl: 0   Semaglutide-Weight Management (WEGOVY) 1 MG/0.5ML SOAJ, Inject 1 mg into the skin once a week., Disp: 2 mL, Rfl: 0   valsartan-hydrochlorothiazide (DIOVAN-HCT) 320-12.5 MG tablet, Take 1 tablet by mouth daily., Disp: 90 tablet, Rfl: 0  Allergies  Allergen Reactions   Latex Rash   Metformin And Related     diarrhea    I personally reviewed active problem list, medication list, allergies, family history, social history, health maintenance, notes from last encounter, lab results, imaging with the patient/caregiver today.   Review of Systems  Constitutional: Negative.   HENT: Negative.    Eyes: Negative.   Respiratory: Negative.    Cardiovascular: Negative.   Gastrointestinal: Negative.   Endocrine: Negative.   Genitourinary: Negative.   Musculoskeletal: Negative.   Skin: Negative.   Allergic/Immunologic: Negative.   Neurological: Negative.   Hematological: Negative.   Psychiatric/Behavioral: Negative.    All other systems reviewed and are negative.     Objective:   Virtual encounter, vitals limited, only able to obtain the following Today's Vitals   01/02/22 1022  Weight: (!) 356 lb (161.5 kg)  Height: _0  (1.626 m)   Body mass index is 61.11 kg/m. Nursing Note and Vital Signs  reviewed.  Physical Exam Vitals and nursing note reviewed.  Pulmonary:     Effort: No tachypnea, accessory muscle usage or respiratory distress.     Comments: Several coughing fits, audible wheeze, no retractions, accessory muscle use, tachypnea  Able to speak in full short sentences - coughing fits intermittent, several times triggered by deep inspiration    PE limited by virtual encounter  No results found for this or any previous visit (from the past 72 hour(s)).  Assessment and Plan:   Pt presents with nearly a week of URI sx and asthma exacerbation/bronchitis She has taken multiple OTC meds and her albuterol and symbicort inhaler w/o improvement - visible bronchospasm and coughing fits during virtual encounter - tx exacerbation with steroids - nebs - will work to get her a neb machine Encouraged her to continue allergy/asthma meds and mucinex Zpak/macrolide to help with antiinflammatory/pulm effects - currently doubt CAP Encouraged rest,  pushing fluids - work note given Controlled substance cough med for QHS     ICD-10-CM   1. Moderate persistent asthmatic bronchitis with acute exacerbation  J45.41 azithromycin (ZITHROMAX) 250 MG tablet    predniSONE (DELTASONE) 20 MG tablet    Respiratory Therapy Supplies (NEBULIZER/TUBING/MOUTHPIECE) KIT    chlorpheniramine-HYDROcodone (TUSSIONEX) 10-8 MG/5ML    guaiFENesin (MUCINEX) 600 MG 12 hr tablet    ipratropium-albuterol (DUONEB) 0.5-2.5 (3) MG/3ML SOLN    For home use only DME Nebulizer machine       -Red flags and when to present for emergency care or RTC including fever >101.38F, chest pain, shortness of breath, new/worsening/un-resolving symptoms, reviewed with patient at time of visit. Follow up and care instructions discussed and provided in AVS. - I discussed the assessment and treatment plan with the patient. The patient was provided an opportunity to ask questions and all were answered. The patient agreed with the plan and  demonstrated an understanding of the instructions.  I provided 20+ minutes of non-face-to-face time during this encounter.  Delsa Grana, PA-C 01/02/22 10:57 AM

## 2022-01-02 NOTE — Addendum Note (Signed)
Addended by: Delsa Grana on: 01/02/2022 05:02 PM   Modules accepted: Orders

## 2022-01-03 NOTE — Telephone Encounter (Signed)
Called and lvm letting patient know prescriptions were sent to [facility]. yesterday. We also faxed the nebulizer supply request to [facility].

## 2022-01-03 NOTE — Telephone Encounter (Unsigned)
Copied from [place] [id number]. Topic: General - Inquiry >> Jan 03, 2022 11:14 AM Devoria Glassing wrote: Reason for CRM: pt saw Leisa for URI and leisa advided she was going to try and find a nebulizer for the pt and call her back w/ the info. Pt states she was to get a cb yesterday.  Pt states she could really use.

## 2022-01-04 ENCOUNTER — Telehealth: Payer: Self-pay | Admitting: Family Medicine

## 2022-01-04 NOTE — Telephone Encounter (Signed)
Called patient and let her know the nebulizer had been ordered and went to [facility] the day of her appt. I informed her I received a fax from them today and sent back OV notes for it to be covered. Patient gave verbal understanding.

## 2022-01-04 NOTE — Telephone Encounter (Signed)
[facility] called to confirm if fax was received / they didn't get the OV notes or the signed order / please resend to fax# [phone]/ please advise

## 2022-01-04 NOTE — Telephone Encounter (Signed)
Copied from [place] [id number]. Topic: General - Inquiry >> Jan 03, 2022 11:14 AM Devoria Glassing wrote: Reason for CRM: pt saw Leisa for URI and leisa advided she was going to try and find a nebulizer for the pt and call her back w/ the info. Pt states she was to get a cb yesterday.  Pt states she could really use. >> Jan 04, 2022  9:56 AM Cyndi Bender wrote: Pt stated she was told that the office would try to find a nebulizer but she has not heard back from anyone and she feels like she really needs it. Pt requests call back at work# [phone] asap.

## 2022-01-05 NOTE — Telephone Encounter (Signed)
Successfully faxed orders were [facility] to [facility] to upload to patient chart. I can re-print DME and OV notes and refax if they say they haven't received.

## 2022-01-09 ENCOUNTER — Telehealth: Payer: Self-pay | Admitting: Psychiatry

## 2022-01-09 DIAGNOSIS — F4310 Post-traumatic stress disorder, unspecified: Secondary | ICD-10-CM

## 2022-01-09 DIAGNOSIS — F32A Depression, unspecified: Secondary | ICD-10-CM

## 2022-01-09 DIAGNOSIS — F411 Generalized anxiety disorder: Secondary | ICD-10-CM

## 2022-01-09 MED ORDER — HYDROXYZINE HCL 50 MG PO TABS: 50.0000 mg | ORAL_TABLET | Freq: Two times a day (BID) | ORAL | 0 refills | Status: DC

## 2022-01-09 NOTE — Telephone Encounter (Signed)
I have sent hydroxyzine to pharmacy as requested

## 2022-01-10 ENCOUNTER — Ambulatory Visit: Payer: Self-pay | Admitting: *Deleted

## 2022-01-10 ENCOUNTER — Telehealth: Payer: Self-pay

## 2022-01-10 DIAGNOSIS — Z8659 Personal history of other mental and behavioral disorders: Secondary | ICD-10-CM

## 2022-01-10 DIAGNOSIS — F4310 Post-traumatic stress disorder, unspecified: Secondary | ICD-10-CM

## 2022-01-10 DIAGNOSIS — F411 Generalized anxiety disorder: Secondary | ICD-10-CM

## 2022-01-10 MED ORDER — BUPROPION HCL ER (XL) 300 MG PO TB24: 300.0000 mg | ORAL_TABLET | Freq: Every morning | ORAL | 0 refills | Status: DC

## 2022-01-10 NOTE — Telephone Encounter (Signed)
Reason for Disposition  [1] Continuous (nonstop) coughing interferes with work or school AND [2] no improvement using cough treatment per Care Advice  Answer Assessment - Initial Assessment Questions 1. ONSET: "When did the cough begin?"      2 weeks 2. SEVERITY: "How bad is the cough today?"      Cough- starts with talking, keeping her up at night 3. SPUTUM: "Describe the color of your sputum" (none, dry cough; clear, white, yellow, green)     No sputum 4. HEMOPTYSIS: "Are you coughing up any blood?" If so ask: "How much?" (flecks, streaks, tablespoons, etc.)     na 5. DIFFICULTY BREATHING: "Are you having difficulty breathing?" If Yes, ask: "How bad is it?" (e.g., mild, moderate, severe)    - MILD: No SOB at rest, mild SOB with walking, speaks normally in sentences, can lie down, no retractions, pulse < 100.    - MODERATE: SOB at rest, SOB with minimal exertion and prefers to sit, cannot lie down flat, speaks in phrases, mild retractions, audible wheezing, pulse 100-120.    - SEVERE: Very SOB at rest, speaks in single words, struggling to breathe, sitting hunched forward, retractions, pulse > 120      Periodic- exertion causes SOB 6. FEVER: "Do you have a fever?" If Yes, ask: "What is your temperature, how was it measured, and when did it start?"     no 7. CARDIAC HISTORY: "Do you have any history of heart disease?" (e.g., heart attack, congestive heart failure)      none 8. LUNG HISTORY: "Do you have any history of lung disease?"  (e.g., pulmonary embolus, asthma, emphysema)     asthma 9. PE RISK FACTORS: "Do you have a history of blood clots?" (or: recent major surgery, recent prolonged travel, bedridden)     no 10. OTHER SYMPTOMS: "Do you have any other symptoms?" (e.g., runny nose, wheezing, chest pain)       Chest tightness  Protocols used: Cough - Acute Non-Productive-A-AH

## 2022-01-10 NOTE — Telephone Encounter (Signed)
  Chief Complaint: cough Symptoms: non productive cough, asthma/chest tightness, SOB- exertion, treatment helped- but did not clear symptoms Frequency: 2 weeks Pertinent Negatives: Patient denies fever Disposition: '[]'$ ED /'[]'$ Urgent Care (no appt availability in office) / '[x]'$ Appointment(In office/virtual)/ '[]'$  [facility] [place]/ '[]'$ Home Care/ '[]'$ Refused Recommended Disposition /'[]'$ [facility]/ '[]'$  Follow-up with PCP Additional Notes: Patient was seen 01/02/22-  not much better

## 2022-01-10 NOTE — Progress Notes (Unsigned)
There were no vitals taken for this visit.   Subjective:    Patient ID: Barbara Pennington, female    DOB: 1980-07-19, 42 y.o.   MRN: [id number]  HPI: Barbara Pennington is a 42 y.o. female  No chief complaint on file.  URI/asthma:  patient had virtual appointment with Delsa Grana PA on 01/02/2022.  According to chart patient had presented with cough for 5+ days had tried DayQuil, Tylenol Cold and flu, daytime, humidifier, steam from the shower.  Patient complains of nonproductive cough she does have a history of asthma COVID test at home was negative x 2.  Patient is using inhaler Symbicort and albuterol.  Patient also complains of body aches, fatigue, nighttime symptoms are much more severe nothing has helped coughing fits and tightness.  Patient denies any chest pain with inspiration but coughing fits did hurt her chest.  Patient also has headache feels like pressure in her head.  Patient was instructed to continue her over-the-counter medications, her albuterol and Symbicort inhaler.  Patient was prescribed nebulizer machine.  She is also instructed to take allergy medication and Mucinex.  Patient was given a Z-Pak, steroid taper and Tussionex.  Patient reports today ***.    Relevant past medical, surgical, family and social history reviewed and updated as indicated. Interim medical history since our last visit reviewed. Allergies and medications reviewed and updated.  Review of Systems  Constitutional: Negative for fever or weight change.  Respiratory: Negative for cough and shortness of breath.   Cardiovascular: Negative for chest pain or palpitations.  Gastrointestinal: Negative for abdominal pain, no bowel changes.  Musculoskeletal: Negative for gait problem or joint swelling.  Skin: Negative for rash.  Neurological: Negative for dizziness or headache.  No other specific complaints in a complete review of systems (except as listed in HPI above).      Objective:    There were no vitals  taken for this visit.  Wt Readings from Last 3 Encounters:  01/02/22 (!) 356 lb (161.5 kg)  12/15/21 (!) 356 lb (161.5 kg)  09/25/21 (!) 345 lb (156.5 kg)    Physical Exam  Constitutional: Patient appears well-developed and well-nourished. Obese *** No distress.  HEENT: head atraumatic, normocephalic, pupils equal and reactive to light, ears ***, neck supple, throat within normal limits Cardiovascular: Normal rate, regular rhythm and normal heart sounds.  No murmur heard. No BLE edema. Pulmonary/Chest: Effort normal and breath sounds normal. No respiratory distress. Abdominal: Soft.  There is no tenderness. Psychiatric: Patient has a normal mood and affect. behavior is normal. Judgment and thought content normal.  Results for orders placed or performed in visit on 12/15/21  TSH  Result Value Ref Range   TSH 1.83 mIU/L  CBC with Differential/Platelet  Result Value Ref Range   WBC 9.3 3.8 - 10.8 Thousand/uL   RBC 4.80 3.80 - 5.10 Million/uL   Hemoglobin 14.7 11.7 - 15.5 g/dL   HCT 43.8 35.0 - 45.0 %   MCV 91.3 80.0 - 100.0 fL   MCH 30.6 27.0 - 33.0 pg   MCHC 33.6 32.0 - 36.0 g/dL   RDW 11.9 11.0 - 15.0 %   Platelets 280 140 - 400 Thousand/uL   MPV 9.5 7.5 - 12.5 fL   Neutro Abs 5,701 1,500 - 7,800 cells/uL   Lymphs Abs 2,511 850 - 3,900 cells/uL   Absolute Monocytes 744 200 - 950 cells/uL   Eosinophils Absolute 288 15 - 500 cells/uL   Basophils Absolute 56 0 -  200 cells/uL   Neutrophils Relative % 61.3 %   Total Lymphocyte 27.0 %   Monocytes Relative 8.0 %   Eosinophils Relative 3.1 %   Basophils Relative 0.6 %  COMPLETE METABOLIC PANEL WITH GFR  Result Value Ref Range   Glucose, Bld 80 65 - 99 mg/dL   BUN 12 7 - 25 mg/dL   Creat 1.18 (H) 0.50 - 0.99 mg/dL   eGFR 59 (L) > OR = 60 mL/min/1.73m2   BUN/Creatinine Ratio 10 6 - 22 (calc)   Sodium 142 135 - 146 mmol/L   Potassium 4.4 3.5 - 5.3 mmol/L   Chloride 102 98 - 110 mmol/L   CO2 33 (H) 20 - 32 mmol/L   Calcium 9.8  8.6 - 10.2 mg/dL   Total Protein 7.0 6.1 - 8.1 g/dL   Albumin 4.3 3.6 - 5.1 g/dL   Globulin 2.7 1.9 - 3.7 g/dL (calc)   AG Ratio 1.6 1.0 - 2.5 (calc)   Total Bilirubin 0.4 0.2 - 1.2 mg/dL   Alkaline phosphatase (APISO) 68 31 - 125 U/L   AST 10 10 - 30 U/L   ALT 25 6 - 29 U/L  Lipid panel  Result Value Ref Range   Cholesterol 186 <200 mg/dL   HDL 60 > OR = 50 mg/dL   Triglycerides 73 <150 mg/dL   LDL Cholesterol (Calc) 110 (H) mg/dL (calc)   Total CHOL/HDL Ratio 3.1 <5.0 (calc)   Non-HDL Cholesterol (Calc) 126 <130 mg/dL (calc)  Hemoglobin A1c  Result Value Ref Range   Hgb A1c MFr Bld 5.5 <5.7 % of total Hgb   Mean Plasma Glucose 111 mg/dL   eAG (mmol/L) 6.2 mmol/L      Assessment & Plan:   Problem List Items Addressed This Visit   None    Follow up plan: No follow-ups on file.      

## 2022-01-10 NOTE — Telephone Encounter (Signed)
pt called states she also needs the bupriopion and the buspirone

## 2022-01-10 NOTE — Telephone Encounter (Signed)
Pt.notified

## 2022-01-10 NOTE — Telephone Encounter (Signed)
BuSpar was already sent out in December.  Bupropion was just sent out today for 90 days.

## 2022-01-11 ENCOUNTER — Ambulatory Visit
Admission: RE | Admit: 2022-01-11 | Discharge: 2022-01-11 | Disposition: A | Discharge status: Home / Self Care | Payer: PRIVATE HEALTH INSURANCE | Source: Ambulatory Visit | Attending: Nurse Practitioner | Admitting: Nurse Practitioner

## 2022-01-11 ENCOUNTER — Other Ambulatory Visit: Payer: Self-pay

## 2022-01-11 ENCOUNTER — Ambulatory Visit
Admission: RE | Admit: 2022-01-11 | Discharge: 2022-01-11 | Disposition: A | Discharge status: Home / Self Care | Payer: PRIVATE HEALTH INSURANCE | Attending: Nurse Practitioner | Admitting: Nurse Practitioner

## 2022-01-11 ENCOUNTER — Ambulatory Visit (INDEPENDENT_AMBULATORY_CARE_PROVIDER_SITE_OTHER): Payer: PRIVATE HEALTH INSURANCE | Admitting: Nurse Practitioner

## 2022-01-11 ENCOUNTER — Encounter: Payer: Self-pay | Admitting: Nurse Practitioner

## 2022-01-11 VITALS — BP 128/76 | HR 96 | Temp 98.6°F | Resp 18 | Ht 64.0 in | Wt 355.2 lb

## 2022-01-11 DIAGNOSIS — J4541 Moderate persistent asthma with (acute) exacerbation: Secondary | ICD-10-CM

## 2022-01-11 DIAGNOSIS — R509 Fever, unspecified: Secondary | ICD-10-CM

## 2022-01-11 DIAGNOSIS — R051 Acute cough: Secondary | ICD-10-CM | POA: Insufficient documentation

## 2022-01-11 DIAGNOSIS — R0602 Shortness of breath: Secondary | ICD-10-CM

## 2022-01-11 MED ORDER — PREDNISONE 10 MG (21) PO TBPK: ORAL_TABLET | ORAL | 0 refills | Status: DC

## 2022-01-15 ENCOUNTER — Other Ambulatory Visit: Payer: Self-pay | Admitting: Family Medicine

## 2022-01-15 ENCOUNTER — Encounter: Payer: Self-pay | Admitting: Family Medicine

## 2022-01-15 DIAGNOSIS — Z6841 Body Mass Index (BMI) 40.0 and over, adult: Secondary | ICD-10-CM

## 2022-01-15 DIAGNOSIS — J4541 Moderate persistent asthma with (acute) exacerbation: Secondary | ICD-10-CM

## 2022-01-15 MED ORDER — WEGOVY 1 MG/0.5ML ~~LOC~~ SOAJ: 1.0000 mg | SUBCUTANEOUS | 0 refills | Status: DC

## 2022-01-15 MED ORDER — WEGOVY 0.5 MG/0.5ML ~~LOC~~ SOAJ: 0.5000 mg | SUBCUTANEOUS | 0 refills | Status: DC

## 2022-01-16 NOTE — Telephone Encounter (Signed)
Pt was notified. She states that she still has not [facility].  [facility]old [facility] to [facility]xs were sent [facility] and maybe they need a insurance card or copay .

## 2022-01-17 ENCOUNTER — Encounter: Payer: Self-pay | Admitting: Nurse Practitioner

## 2022-01-17 ENCOUNTER — Other Ambulatory Visit: Payer: Self-pay | Admitting: Family Medicine

## 2022-01-17 ENCOUNTER — Other Ambulatory Visit: Payer: Self-pay | Admitting: Nurse Practitioner

## 2022-01-17 DIAGNOSIS — J4541 Moderate persistent asthma with (acute) exacerbation: Secondary | ICD-10-CM

## 2022-01-17 MED ORDER — WEGOVY 0.5 MG/0.5ML ~~LOC~~ SOAJ: 0.5000 mg | SUBCUTANEOUS | 0 refills | Status: DC

## 2022-01-17 MED ORDER — WEGOVY 1 MG/0.5ML ~~LOC~~ SOAJ: 1.0000 mg | SUBCUTANEOUS | 0 refills | Status: DC

## 2022-01-25 NOTE — Telephone Encounter (Signed)
Pt called saying she has been waiting for the pharmacy to send her the [facility].  She said it was supposed to be sent a week ago but she still has not recd.    Please advise  [phone]

## 2022-01-29 ENCOUNTER — Encounter: Payer: Self-pay | Admitting: Student in an Organized Health Care Education/Training Program

## 2022-01-29 ENCOUNTER — Ambulatory Visit (INDEPENDENT_AMBULATORY_CARE_PROVIDER_SITE_OTHER): Payer: Medicaid Other | Admitting: Licensed Clinical Social Worker

## 2022-01-29 ENCOUNTER — Ambulatory Visit (INDEPENDENT_AMBULATORY_CARE_PROVIDER_SITE_OTHER): Payer: Medicaid Other | Admitting: Student in an Organized Health Care Education/Training Program

## 2022-01-29 VITALS — BP 124/78 | HR 85 | Temp 97.8°F | Ht 64.5 in | Wt 358.2 lb

## 2022-01-29 DIAGNOSIS — F4310 Post-traumatic stress disorder, unspecified: Secondary | ICD-10-CM | POA: Diagnosis not present

## 2022-01-29 DIAGNOSIS — F411 Generalized anxiety disorder: Secondary | ICD-10-CM | POA: Diagnosis not present

## 2022-01-29 DIAGNOSIS — R053 Chronic cough: Secondary | ICD-10-CM

## 2022-01-29 MED ORDER — FLUTICASONE PROPIONATE 50 MCG/ACT NA SUSP: 1.0000 | Freq: Every day | NASAL | 6 refills | Status: DC

## 2022-01-29 MED ORDER — DESLORATADINE 5 MG PO TABS: 5.0000 mg | ORAL_TABLET | Freq: Every day | ORAL | 11 refills | Status: DC

## 2022-01-29 NOTE — Progress Notes (Signed)
Virtual Visit via Video Note  I connected with Barbara Pennington on 01/29/22 at  4:00 PM EST by a video enabled telemedicine application and verified that I am speaking with the correct person using two identifiers.  Location: Patient: home Provider: remote office [place], [place])   I discussed the limitations of evaluation and management by telemedicine and the availability of in person appointments. The patient expressed understanding and agreed to proceed.  I discussed the assessment and treatment plan with the patient. The patient was provided an opportunity to ask questions and all were answered. The patient agreed with the plan and demonstrated an understanding of the instructions.   The patient was advised to call back or seek an in-person evaluation if the symptoms worsen or if the condition fails to improve as anticipated.  I provided 45 minutes of non-face-to-face time during this encounter.   Donevin Sainsbury R Nahom Carfagno, LCSW   THERAPIST PROGRESS NOTE  Session Time: 4-445p  Participation Level: Active  Behavioral Response: Neat and Well GroomedAlertDepressed  Type of Therapy: Individual Therapy  Treatment Goals addressed:  Develop healthy thinking patterns and beliefs about self, others, and the world that lead to the alleviation and help prevent the relapse of depression per self report 3 out of 5 sessions documented.   Learn and implement coping skills that result in a reduction of anxiety and worry, and improve daily functioning per pt report 3 out of 5 sessions documented    ProgressTowards Goals: Progressing  Interventions: CBT, Motivational Interviewing, and Supportive  Summary: Barbara Pennington is a 42 y.o. female who presents with continuing symptoms related to PTSD, depression, skin picking. Pt reports that mood has been stable recently, and pt feels more "in control" of her anxiety/stress at times. Pt is continuing to experience chronic pain, which impacts physical  mobility.  Allowed pt to explore and express thoughts and feelings associated with recent life situations and external stressors.  Pt reports ongoing financial concerns that have been triggered recently when boyfriend was laid off from his job in December. Pt feels that she and boyfriend were stressing each other out. Reviewed positive relationship-building strategies for pt and boyfriend. 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 Pt feels that this is something that is necessary for her and is proud that she has maintained the boundaries that she has set. Discussed health related concerns--pt still experiencing chronic pain in knee and is having a hard time getting over some acute respiratory illnesses. Discussed grief and identified positive coping skills: games on phone, watching TV, crochet, doing hair and nails, listening to white noise.   Continued recommendations are as follows: self care behaviors, positive social engagements, focusing on overall work/home/life balance, and focusing on positive physical and emotional wellness.   Suicidal/Homicidal: No  Therapist Response: Pt is continuing to apply interventions learned in session into daily life situations. Pt is currently on track to meet goals utilizing interventions mentioned above. Personal growth and progress noted. Treatment to continue as indicated.   Plan: Return again in 4 weeks.  Diagnosis:  Encounter Diagnoses  Name Primary?   PTSD (post-traumatic stress disorder) Yes   GAD (generalized anxiety disorder)     Collaboration of Care: Patient refused AEB pt is scheduled to start psychiatric treatment with Dr. Ursula Alert . Pt had psychiatric services w/ [facility] in past.   Patient/Guardian was advised Release of Information  must be obtained prior to any record release in order to collaborate their care  with an outside provider. Patient/Guardian was advised if they have not already done so to contact the registration department to sign all necessary forms in order for [name] to release information regarding their care.   Consent: Patient/Guardian gives verbal consent for treatment and assignment of benefits for services provided during this visit. Patient/Guardian expressed understanding and agreed to proceed.   [place], LCSW 01/29/2022

## 2022-01-29 NOTE — Progress Notes (Signed)
Synopsis: Referred in for cough by Steele Sizer, MD  Assessment & Plan:   #Chronic cough #Shortness of Breath  She is presenting for the evaluation of cough in the setting of known asthma as well as recent weight gain. My differential includes uncontrolled asthma, upper airway cough syndrome, and reflux disease. Workup ordered to include a pulmonary function test to assess for obstruction and an esophagogram to assess for reflux. I will ask the patient to continue using Symbicort twice daily and will initiate her on a second generation anti-histamine and intra-nasal steroids.  - Pulmonary Function Test [facility] Only; Future - DG ESOPHAGUS W DOUBLE CM (HD); Future - fluticasone (FLONASE) 50 MCG/ACT nasal spray; Place 1 spray into both nostrils daily.  Dispense: 18.2 mL; Refill: 6 - desloratadine (CLARINEX) 5 MG tablet; Take 1 tablet (5 mg total) by mouth daily.  Dispense: 30 tablet; Refill: 11  Return in about 2 months (around 03/30/2022).  I spent 60 minutes caring for this patient today, including preparing to see the patient, obtaining a medical history , reviewing a separately obtained history, performing a medically appropriate examination and/or evaluation, counseling and educating the patient/family/caregiver, ordering medications, tests, or procedures, documenting clinical information in the electronic health record, and independently interpreting results (not separately reported/billed) and communicating results to the patient/family/caregiver  Armando Reichert, MD [facility] 01/29/2022 1:20 PM    End of visit medications:  Meds ordered this encounter  Medications   fluticasone (FLONASE) 50 MCG/ACT nasal spray    Sig: Place 1 spray into both nostrils daily.    Dispense:  18.2 mL    Refill:  6   desloratadine (CLARINEX) 5 MG tablet    Sig: Take 1 tablet (5 mg total) by mouth daily.    Dispense:  30 tablet    Refill:  11     Current Outpatient  Medications:    acetaminophen (TYLENOL) 500 MG tablet, Take 2 tablets (1,000 mg total) by mouth every 8 (eight) hours., Disp: 90 tablet, Rfl: 2   albuterol (VENTOLIN HFA) 108 (90 Base) MCG/ACT inhaler, Inhale 2 puffs into the lungs every 4 (four) hours as needed for wheezing or shortness of breath., Disp: 9 g, Rfl: 0   amLODipine (NORVASC) 2.5 MG tablet, Take 1 tablet (2.5 mg total) by mouth every morning., Disp: 90 tablet, Rfl: 1   atorvastatin (LIPITOR) 20 MG tablet, Take 1 tablet (20 mg total) by mouth every morning., Disp: 90 tablet, Rfl: 1   budesonide-formoterol (SYMBICORT) 160-4.5 MCG/ACT inhaler, Inhale 2 puffs into the lungs 2 (two) times daily., Disp: 3 each, Rfl: 0   buPROPion (WELLBUTRIN XL) 300 MG 24 hr tablet, Take 1 tablet (300 mg total) by mouth every morning., Disp: 90 tablet, Rfl: 0   busPIRone (BUSPAR) 15 MG tablet, Take 1 tablet (15 mg total) by mouth 3 (three) times daily., Disp: 270 tablet, Rfl: 0   desloratadine (CLARINEX) 5 MG tablet, Take 1 tablet (5 mg total) by mouth daily., Disp: 30 tablet, Rfl: 11   escitalopram (LEXAPRO) 20 MG tablet, Take 1 tablet (20 mg total) by mouth daily., Disp: 90 tablet, Rfl: 0   fluticasone (FLONASE) 50 MCG/ACT nasal spray, Place 1 spray into both nostrils daily., Disp: 18.2 mL, Rfl: 6   gabapentin (NEURONTIN) 300 MG capsule, Take 1 capsule by mouth 3 (three) times daily., Disp: , Rfl:    hydrOXYzine (ATARAX) 50 MG tablet, Take 1 tablet (50 mg total) by mouth 2 (two) times daily., Disp: 180 tablet,  Rfl: 0   ipratropium-albuterol (DUONEB) 0.5-2.5 (3) MG/3ML SOLN, Take 3 mLs by nebulization 3 (three) times daily as needed (asthma/bronchitis sx, cough wheeze sob)., Disp: 180 mL, Rfl: 1   Melatonin 10 MG CAPS, Take 10-20 mg by mouth at bedtime., Disp: , Rfl:    metaxalone (SKELAXIN) 800 MG tablet, Take 1 tablet (800 mg total) by mouth 3 (three) times daily as needed for muscle spasms., Disp: 270 tablet, Rfl: 0   montelukast (SINGULAIR) 10 MG tablet,  Take 1 tablet (10 mg total) by mouth at bedtime., Disp: 90 tablet, Rfl: 1   Semaglutide-Weight Management (WEGOVY) 0.5 MG/0.5ML SOAJ, Inject 0.5 mg into the skin once a week., Disp: 2 mL, Rfl: 0   Semaglutide-Weight Management (WEGOVY) 1 MG/0.5ML SOAJ, Inject 1 mg into the skin once a week., Disp: 2 mL, Rfl: 0   valsartan-hydrochlorothiazide (DIOVAN-HCT) 320-12.5 MG tablet, Take 1 tablet by mouth daily., Disp: 90 tablet, Rfl: 0   Subjective:   PATIENT ID: Barbara Pennington GENDER: female DOB: December 07, 1980, MRN: [id number]  Chief Complaint  Patient presents with   Follow-up    SOB with exertion, non prod cough and wheezing.     HPI  The patient is a 42 year old female presenting to clinic for the evaluation of cough.  She is also reporting shortness of breath.  Patient reports that she is experienced progressive nonproductive cough that is worse at night and is associated with exertional dyspnea.  Her symptoms have pretty much worsened since Thanksgiving but she does report shortness of breath with exertion for over 2 to 3 years now.  The cough is nonproductive, with no hemoptysis, and no associated chest pain or tenderness.  The patient feels no pleurisy, denies any night sweats or weight loss, and does not have any rashes.  There are no GI or GU symptoms associated with it and no worsening lower extremity edema. The shortness of breath is mostly exertional and was noticed by the patient as she started to gain more weight in 2019. She reports a personal history of asthma for which she had been given multiple inhalers. Last was Symbicort that she reports using, without much improvement. She's also using an albuterol rescue inhaler.  She reports a distant history of smoking and quit in 2016.  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  She has no significant medical problems otherwise.  She denies any occupational exposures.  Ancillary information including prior  medications, full medical/surgical/family/social histories, and PFTs (when available) are listed below and have been reviewed.   Review of Systems  Constitutional:  Negative for chills, fever, malaise/fatigue and weight loss.  Respiratory:  Positive for cough and shortness of breath. Negative for hemoptysis, sputum production and wheezing.   Cardiovascular:  Negative for chest pain, palpitations, claudication, leg swelling and PND.  Neurological:  Negative for dizziness.     Objective:   Vitals:   01/29/22 1146  BP: 124/78  Pulse: 85  Temp: 97.8 F (36.6 C)  TempSrc: Temporal  SpO2: 95%  Weight: (!) 358 lb 3.2 oz (162.5 kg)  Height: 5' 4.5" (1.638 m)   95% on RA  BMI Readings from Last 3 Encounters:  01/29/22 60.54 kg/m  01/11/22 60.97 kg/m  01/02/22 61.11 kg/m   Wt Readings from Last 3 Encounters:  01/29/22 (!) 358 lb 3.2 oz (162.5 kg)  01/11/22 (!) 355 lb 3.2 oz (161.1 kg)  01/02/22 (!) 356 lb (161.5 kg)    Physical  Exam Constitutional:      General: She is not in acute distress.    Appearance: Normal appearance. She is obese. She is not ill-appearing.  HENT:     Head: Normocephalic.     Mouth/Throat:     Mouth: Mucous membranes are moist.  Eyes:     Pupils: Pupils are equal, round, and reactive to light.  Cardiovascular:     Rate and Rhythm: Normal rate and regular rhythm.     Heart sounds: Normal heart sounds.  Pulmonary:     Effort: Pulmonary effort is normal. No respiratory distress.     Breath sounds: Normal breath sounds. No stridor. No wheezing or rales.  Abdominal:     General: There is distension.     Palpations: Abdomen is soft.  Musculoskeletal:     Right lower leg: No edema.     Left lower leg: No edema.  Skin:    General: Skin is warm.  Neurological:     General: No focal deficit present.     Mental Status: She is alert and oriented to person, place, and time. Mental status is at baseline.     Ancillary Information    Past Medical  History:  Diagnosis Date   Anxiety    Arthritis 2006   rhuematoid - mild - no current issues   Asthma 2003   Borderline personality disorder ([place])    Chronic kidney disease    stage 3   Complication of anesthesia    "Usually" has low temp after surgery.After hysterectomy temp was 94   Depression    GERD (gastroesophageal reflux disease)    History of kidney stones    Hyperlipidemia    Hypertension 2013   Lump or mass in breast 2013   RIGHT BREAST   Motion sickness    repeated amusement park rides   PTSD (post-traumatic stress disorder)    Scoliosis    no current issues   Shortness of breath dyspnea    secondary to weight    Sleep apnea    uses cpap   Ulcer 2001     Family History  Problem Relation Age of Onset   Obesity Mother    Diabetes Mother    High Cholesterol Mother    Hypertension Mother    Neuropathy Mother    Depression Mother    Anxiety disorder Mother    Obesity Father    Hypertension Father    High Cholesterol Father    Depression Father    Anxiety disorder Father    Seizures Brother    Testicular cancer Brother    Breast cancer Maternal Grandmother    Hypertension Maternal Grandmother    Aneurysm Maternal Grandmother    Stroke Maternal Grandmother    Hypertension Maternal Grandfather    High Cholesterol Maternal Grandfather    Heart disease Maternal Grandfather        has a pacemaker   Stroke Maternal Grandfather    Alzheimer's disease Paternal Grandmother    Dementia Paternal Grandmother    Colon cancer Paternal Grandfather    Mental illness Maternal Aunt    Suicidality Cousin      Past Surgical History:  Procedure Laterality Date   ABDOMINAL HYSTERECTOMY  01/08/2006   BREAST BIOPSY Left 01/09/2011   CORE - FIBROADENOMA VS PHYLLODES   CESAREAN SECTION  01/08/2005   COLONOSCOPY WITH PROPOFOL N/A 10/09/2016   Procedure: COLONOSCOPY WITH PROPOFOL;  Surgeon: Jonathon Bellows, MD;  Location[facility] ENDOSCOPY;  Service: Gastroenterology;  Laterality: N/A;   COLONOSCOPY WITH PROPOFOL N/A 11/09/2019   Procedure: COLONOSCOPY WITH PROPOFOL;  Surgeon: Lin Landsman, MD;  Location: [facility] ENDOSCOPY;  Service: Gastroenterology;  Laterality: N/A;   ESOPHAGOGASTRODUODENOSCOPY     GUM SURGERY  01/08/2002   KNEE ARTHROSCOPY WITH MEDIAL MENISECTOMY Right 07/24/2021   Procedure: Right medial meniscus root repair and patella chondroplasty;  Surgeon: Leim Fabry, MD;  Location: [facility] ORS;  Service: Orthopedics;  Laterality: Right;   PLANTAR FASCIA RELEASE Left 08/19/2014   Procedure: ENDOSCOPIC PLANTAR FASCIOTOMY RELEASE L WITH TOPAZ;  Surgeon: Albertine Patricia, DPM;  Location: [place];  Service: Podiatry;  Laterality: Left;  LMA WITH POPLITEAL BLOCK   TONSILLECTOMY  01/08/1982    Social History   Socioeconomic History   Marital status: Divorced    Spouse name: Not on file   Number of children: 2   Years of education: 14   Highest education level: Bachelor's degree (e.g., BA, AB, BS)  Occupational History   Not on file  Tobacco Use   Smoking status: Former    Packs/day: 0.25    Years: 10.00    Total pack years: 2.50    Types: Cigarettes    Quit date: 07/05/2014    Years since quitting: 7.5   Smokeless tobacco: Never  Vaping Use   Vaping Use: Never used  Substance and Sexual Activity   Alcohol use: Yes    Comment: rare   Drug use: No   Sexual activity: Yes    Partners: Male    Birth control/protection: Surgical    Comment: 2007  Other Topics Concern   Not on file  Social History Narrative   Not on file   Social Determinants of Health   Financial Resource Strain: High Risk (05/31/2020)   Overall Financial Resource Strain (CARDIA)    Difficulty of Paying Living Expenses: Hard  Food Insecurity: No Food Insecurity (05/31/2020)   Hunger Vital Sign    Worried About Running Out of Food in the Last Year: Never true    Ran Out of Food in the Last Year: Never true  Transportation Needs: No Transportation Needs  (05/31/2020)   PRAPARE - Hydrologist (Medical): No    Lack of Transportation (Non-Medical): No  Physical Activity: Unknown (05/31/2020)   Exercise Vital Sign    Days of Exercise per Week: 0 days    Minutes of Exercise per Session: Not on file  Stress: Stress Concern Present (05/31/2020)   [place]    Feeling of Stress : To some extent  Social Connections: Socially Isolated (05/31/2020)   Social Connection and Isolation Panel [NHANES]    Frequency of Communication with Friends and Family: More than three times a week    Frequency of Social Gatherings with Friends and Family: Never    Attends Religious Services: Never    Marine scientist or Organizations: No    Attends Music therapist: Not on file    Marital Status: Divorced  Intimate Partner Violence: Not At Risk (09/25/2018)   Humiliation, Afraid, Rape, and Kick questionnaire    Fear of Current or Ex-Partner: No    Emotionally Abused: No    Physically Abused: No    Sexually Abused: No     Allergies  Allergen Reactions   Latex Rash   Metformin And Related     diarrhea     CBC    Component Value Date/Time  WBC 9.3 12/15/2021 0828   RBC 4.80 12/15/2021 0828   HGB 14.7 12/15/2021 0828   HGB 13.5 08/26/2013 1034   HCT 43.8 12/15/2021 0828   HCT 41.9 08/26/2013 1034   PLT 280 12/15/2021 0828   PLT 183 08/26/2013 1034   MCV 91.3 12/15/2021 0828   MCV 95 08/26/2013 1034   MCH 30.6 12/15/2021 0828   MCHC 33.6 12/15/2021 0828   RDW 11.9 12/15/2021 0828   RDW 12.6 08/26/2013 1034   LYMPHSABS 2,511 12/15/2021 0828   LYMPHSABS 2.0 01/06/2013 1910   MONOABS 0.8 06/20/2020 2138   MONOABS 0.5 01/06/2013 1910   EOSABS 288 12/15/2021 0828   EOSABS 0.2 01/06/2013 1910   BASOSABS 56 12/15/2021 0828   BASOSABS 0.0 01/06/2013 1910    Pulmonary Functions Testing Results:     No data to display           Outpatient Medications Prior to Visit  Medication Sig Dispense Refill   acetaminophen (TYLENOL) 500 MG tablet Take 2 tablets (1,000 mg total) by mouth every 8 (eight) hours. 90 tablet 2   albuterol (VENTOLIN HFA) 108 (90 Base) MCG/ACT inhaler Inhale 2 puffs into the lungs every 4 (four) hours as needed for wheezing or shortness of breath. 9 g 0   amLODipine (NORVASC) 2.5 MG tablet Take 1 tablet (2.5 mg total) by mouth every morning. 90 tablet 1   atorvastatin (LIPITOR) 20 MG tablet Take 1 tablet (20 mg total) by mouth every morning. 90 tablet 1   budesonide-formoterol (SYMBICORT) 160-4.5 MCG/ACT inhaler Inhale 2 puffs into the lungs 2 (two) times daily. 3 each 0   buPROPion (WELLBUTRIN XL) 300 MG 24 hr tablet Take 1 tablet (300 mg total) by mouth every morning. 90 tablet 0   busPIRone (BUSPAR) 15 MG tablet Take 1 tablet (15 mg total) by mouth 3 (three) times daily. 270 tablet 0   escitalopram (LEXAPRO) 20 MG tablet Take 1 tablet (20 mg total) by mouth daily. 90 tablet 0   gabapentin (NEURONTIN) 300 MG capsule Take 1 capsule by mouth 3 (three) times daily.     hydrOXYzine (ATARAX) 50 MG tablet Take 1 tablet (50 mg total) by mouth 2 (two) times daily. 180 tablet 0   ipratropium-albuterol (DUONEB) 0.5-2.5 (3) MG/3ML SOLN Take 3 mLs by nebulization 3 (three) times daily as needed (asthma/bronchitis sx, cough wheeze sob). 180 mL 1   Melatonin 10 MG CAPS Take 10-20 mg by mouth at bedtime.     metaxalone (SKELAXIN) 800 MG tablet Take 1 tablet (800 mg total) by mouth 3 (three) times daily as needed for muscle spasms. 270 tablet 0   montelukast (SINGULAIR) 10 MG tablet Take 1 tablet (10 mg total) by mouth at bedtime. 90 tablet 1   Semaglutide-Weight Management (WEGOVY) 0.5 MG/0.5ML SOAJ Inject 0.5 mg into the skin once a week. 2 mL 0   Semaglutide-Weight Management (WEGOVY) 1 MG/0.5ML SOAJ Inject 1 mg into the skin once a week. 2 mL 0   valsartan-hydrochlorothiazide (DIOVAN-HCT) 320-12.5 MG tablet  Take 1 tablet by mouth daily. 90 tablet 0   predniSONE (STERAPRED UNI-PAK 21 TAB) 10 MG (21) TBPK tablet Take as directed on package.  (60 mg po on day 1, 50 mg po on day 2...) 21 tablet 0   No facility-administered medications prior to visit.

## 2022-01-30 ENCOUNTER — Encounter: Payer: Self-pay | Admitting: Family Medicine

## 2022-01-30 ENCOUNTER — Encounter: Payer: Self-pay | Admitting: Student in an Organized Health Care Education/Training Program

## 2022-01-30 DIAGNOSIS — R0602 Shortness of breath: Secondary | ICD-10-CM

## 2022-01-30 NOTE — Telephone Encounter (Signed)
Routing to Dr. Genia Harold as an Juluis Rainier

## 2022-01-30 NOTE — Telephone Encounter (Signed)
Pt calling to follow up on her medication refill for Semaglutide-Weight Management (WEGOVY) 0.5 MG/0.5ML SOAJ. Stated Dr.Sowles sent her a message via [place] stating she would send it to a pharmacy in [facility]; they have it in [place] and they will ship for free. Provided the patient with pharmacy information for [place], [place], - [address]. and adviced to reach out to them.  Pt is still requesting a call back from a member of clinical staff. Pt was upset that she had been waiting for so long.  Pt did call in to follow up before, but the message was unfortunately not routed to the office.   Please advise.

## 2022-02-01 ENCOUNTER — Encounter: Payer: Self-pay | Admitting: Psychiatry

## 2022-02-01 ENCOUNTER — Telehealth (INDEPENDENT_AMBULATORY_CARE_PROVIDER_SITE_OTHER): Payer: PRIVATE HEALTH INSURANCE | Admitting: Psychiatry

## 2022-02-01 DIAGNOSIS — Z8659 Personal history of other mental and behavioral disorders: Secondary | ICD-10-CM | POA: Diagnosis not present

## 2022-02-01 DIAGNOSIS — F411 Generalized anxiety disorder: Secondary | ICD-10-CM | POA: Diagnosis not present

## 2022-02-01 DIAGNOSIS — F1011 Alcohol abuse, in remission: Secondary | ICD-10-CM

## 2022-02-01 DIAGNOSIS — F424 Excoriation (skin-picking) disorder: Secondary | ICD-10-CM | POA: Diagnosis not present

## 2022-02-01 DIAGNOSIS — F4310 Post-traumatic stress disorder, unspecified: Secondary | ICD-10-CM | POA: Diagnosis not present

## 2022-02-01 MED ORDER — HYDROXYZINE HCL 50 MG PO TABS: 25.0000 mg | ORAL_TABLET | ORAL | 0 refills | Status: DC

## 2022-02-01 NOTE — Progress Notes (Signed)
Virtual Visit via Video Note  I connected with Barbara Pennington on 02/01/22 at  2:00 PM EST by a video enabled telemedicine application and verified that I am speaking with the correct person using two identifiers.  Location Provider Location : [facility] Patient Location : Work  Participants: Patient , Provider   I discussed the limitations of evaluation and management by telemedicine and the availability of in person appointments. The patient expressed understanding and agreed to proceed.   I discussed the assessment and treatment plan with the patient. The patient was provided an opportunity to ask questions and all were answered. The patient agreed with the plan and demonstrated an understanding of the instructions.   The patient was advised to call back or seek an in-person evaluation if the symptoms worsen or if the condition fails to improve as anticipated.   [facility] MD OP Progress Note  02/01/2022 2:21 PM SADEEL FIDDLER  MRN:  [id number]  Chief Complaint:  Chief Complaint  Patient presents with   Follow-up   Medication Refill   Anxiety   Depression   HPI: Barbara Pennington is a 42 year old Caucasian female, divorced, employed, lives in [place], has a history of PTSD, GAD, history of borderline personality disorder, morbid obesity, OSA on CPAP, hypertension/chronic kidney disease, asthma, chronic back pain, status post right sided knee surgery, presented for medication management, was evaluated by telemedicine today.  Patient today reports she is currently struggling with bronchitis, ongoing since the past several days.  Patient reports she hence feels tired as well as is short of breath.  She however is trying to work.  Does have upcoming appointment with her providers to manage it.  Patient reports she has not noticed much benefit with the higher dosage of BuSpar yet since she has been physically sick.  She however would like to stay on this dosage.  Continues to have a lot of skin picking.   She reports she never got a chance to talk about this with her therapist however will be doing so soon.  Does have upcoming therapy appointment coming up.  Patient otherwise reports she has not had any side effects to any medications.  Would like to stay on the current dosage of medications.  Patient reports sleep was restless now since she has bronchiolitis.  Denies any suicidality, homicidality or perceptual disturbances.  Patient denies any other concerns today.  Visit Diagnosis:    ICD-10-CM   1. PTSD (post-traumatic stress disorder)  F43.10 hydrOXYzine (ATARAX) 50 MG tablet    2. GAD (generalized anxiety disorder)  F41.1 hydrOXYzine (ATARAX) 50 MG tablet    3. 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 History of borderline personality disorder  Z86.59     5. History of alcohol abuse  F10.11       Past Psychiatric History: Reviewed past psychiatric history from progress note on 05/05/2021.  Patient was previously under the care of [facility].  Past Medical History:  Past Medical History:  Diagnosis Date   Anxiety    Arthritis 2006   rhuematoid - mild - no current issues   Asthma 2003   Borderline personality disorder ([place])    Chronic kidney disease    stage 3   Complication of anesthesia    "Usually" has low temp after surgery.After hysterectomy temp was 94   Depression    GERD (gastroesophageal reflux disease)    History of kidney stones    Hyperlipidemia    Hypertension 2013   Lump  or mass in breast 2013   RIGHT BREAST   Motion sickness    repeated amusement park rides   PTSD (post-traumatic stress disorder)    Scoliosis    no current issues   Shortness of breath dyspnea    secondary to weight    Sleep apnea    uses cpap   Ulcer 2001    Past Surgical History:  Procedure Laterality Date   ABDOMINAL HYSTERECTOMY  01/08/2006   BREAST BIOPSY Left 01/09/2011   CORE - FIBROADENOMA VS PHYLLODES   CESAREAN SECTION  01/08/2005   COLONOSCOPY WITH PROPOFOL N/A 10/09/2016    Procedure: COLONOSCOPY WITH PROPOFOL;  Surgeon: Jonathon Bellows, MD;  Location[facility] END[facility]COPY;  Service: Gastroenterology;  Laterality: N/A;   COLONOSCOPY WITH PROPOFOL N/A 11/09/2019   Procedure: COLONOSCOPY WITH PROPOFOL;  Surgeon: Lin Landsman, MD;  Location: [facility] ENDOSCOPY;  Service: Gastroenterology;  Laterality: N/A;   ESOPHAGOGASTRODUODENOSCOPY     GUM SURGERY  01/08/2002   KNEE ARTHROSCOPY WITH MEDIAL MENISECTOMY Right 07/24/2021   Procedure: Right medial meniscus root repair and patella chondroplasty;  Surgeon: Leim Fabry, MD;  Location: [facility] ORS;  Service: Orthopedics;  Laterality: Right;   PLANTAR FASCIA RELEASE Left 08/19/2014   Procedure: ENDOSCOPIC PLANTAR FASCIOTOMY RELEASE L WITH TOPAZ;  Surgeon: Albertine Patricia, DPM;  Location: [facility];  Service: Podiatry;  Laterality: Left;  LMA WITH POPLITEAL BLOCK   TONSILLECTOMY  01/08/1982    Family Psychiatric History: Reviewed family psychiatric history from progress note on 05/05/2021.  Family History:  Family History  Problem Relation Age of Onset   Obesity Mother    Diabetes Mother    High Cholesterol Mother    Hypertension Mother    Neuropathy Mother    Depression Mother    Anxiety disorder Mother    Obesity Father    Hypertension Father    High Cholesterol Father    Depression Father    Anxiety disorder Father    Seizures Brother    Testicular cancer Brother    Breast cancer Maternal Grandmother    Hypertension Maternal Grandmother    Aneurysm Maternal Grandmother    Stroke Maternal Grandmother    Hypertension Maternal Grandfather    High Cholesterol Maternal Grandfather    Heart disease Maternal Grandfather        has a pacemaker   Stroke Maternal Grandfather    Alzheimer's disease Paternal Grandmother    Dementia Paternal Grandmother    Colon cancer Paternal Grandfather    Mental illness Maternal Aunt    Suicidality Cousin     Social History: Reviewed social history from progress note on  05/05/2021. Social History   Socioeconomic History   Marital status: Divorced    Spouse name: Not on file   Number of children: 2   Years of education: 14   Highest education level: Bachelor's degree (e.g., BA, AB, BS)  Occupational History   Not on file  Tobacco Use   Smoking status: Former    Packs/day: 0.25    Years: 10.00    Total pack years: 2.50    Types: Cigarettes    Quit date: 07/05/2014    Years since quitting: 7.5   Smokeless tobacco: Never  Vaping Use   Vaping Use: Never used  Substance and Sexual Activity   Alcohol use: Yes    Comment: rare   Drug use: No   Sexual activity: Yes    Partners: Male    Birth control/protection: Surgical    Comment: 2007  Other Topics Concern   Not on file  Social History Narrative   Not on file   Social Determinants of Health   Financial Resource Strain: High Risk (05/31/2020)   Overall Financial Resource Strain (CARDIA)    Difficulty of Paying Living Expenses: Hard  Food Insecurity: No Food Insecurity (05/31/2020)   Hunger Vital Sign    Worried About Running Out of Food in the Last Year: Never true    Ran Out of Food in the Last Year: Never true  Transportation Needs: No Transportation Needs (05/31/2020)   PRAPARE - Hydrologist (Medical): No    Lack of Transportation (Non-Medical): No  Physical Activity: Unknown (05/31/2020)   Exercise Vital Sign    Days of Exercise per Week: 0 days    Minutes of Exercise per Session: Not on file  Stress: Stress Concern Present (05/31/2020)   [place]    Feeling of Stress : To some extent  Social Connections: Socially Isolated (05/31/2020)   Social Connection and Isolation Panel [NHANES]    Frequency of Communication with Friends and Family: More than three times a week    Frequency of Social Gatherings with Friends and Family: Never    Attends Religious Services: Never    Corporate treasurer or Organizations: No    Attends Music therapist: Not on file    Marital Status: Divorced    Allergies:  Allergies  Allergen Reactions   Latex Rash   Metformin And Related     diarrhea    Metabolic Disorder Labs: Lab Results  Component Value Date   HGBA1C 5.5 12/15/2021   MPG 111 12/15/2021   MPG 105 12/06/2020   No results found for: "PROLACTIN" Lab Results  Component Value Date   CHOL 186 12/15/2021   TRIG 73 12/15/2021   HDL 60 12/15/2021   CHOLHDL 3.1 12/15/2021   VLDL 27 08/06/2019   LDLCALC 110 (H) 12/15/2021   LDLCALC 111 (H) 12/06/2020   Lab Results  Component Value Date   TSH 1.83 12/15/2021   TSH 2.59 04/04/2018    Therapeutic Level Labs: No results found for: "LITHIUM" No results found for: "VALPROATE" No results found for: "CBMZ"  Current Medications: Current Outpatient Medications  Medication Sig Dispense Refill   acetaminophen (TYLENOL) 500 MG tablet Take 2 tablets (1,000 mg total) by mouth every 8 (eight) hours. 90 tablet 2   albuterol (VENTOLIN HFA) 108 (90 Base) MCG/ACT inhaler Inhale 2 puffs into the lungs every 4 (four) hours as needed for wheezing or shortness of breath. 9 g 0   amLODipine (NORVASC) 2.5 MG tablet Take 1 tablet (2.5 mg total) by mouth every morning. 90 tablet 1   atorvastatin (LIPITOR) 20 MG tablet Take 1 tablet (20 mg total) by mouth every morning. 90 tablet 1   budesonide-formoterol (SYMBICORT) 160-4.5 MCG/ACT inhaler Inhale 2 puffs into the lungs 2 (two) times daily. 3 each 0   buPROPion (WELLBUTRIN XL) 300 MG 24 hr tablet Take 1 tablet (300 mg total) by mouth every morning. 90 tablet 0   busPIRone (BUSPAR) 15 MG tablet Take 1 tablet (15 mg total) by mouth 3 (three) times daily. 270 tablet 0   escitalopram (LEXAPRO) 20 MG tablet Take 1 tablet (20 mg total) by mouth daily. 90 tablet 0   gabapentin (NEURONTIN) 300 MG capsule Take 1 capsule by mouth 3 (three) times daily.     ipratropium-albuterol (DUONEB)  0.5-2.5 (3) MG/3ML SOLN Take 3 mLs by nebulization 3 (three) times daily as needed (asthma/bronchitis sx, cough wheeze sob). 180 mL 1   Melatonin 10 MG CAPS Take 10-20 mg by mouth at bedtime.     metaxalone (SKELAXIN) 800 MG tablet Take 1 tablet (800 mg total) by mouth 3 (three) times daily as needed for muscle spasms. 270 tablet 0   montelukast (SINGULAIR) 10 MG tablet Take 1 tablet (10 mg total) by mouth at bedtime. 90 tablet 1   valsartan-hydrochlorothiazide (DIOVAN-HCT) 320-12.5 MG tablet Take 1 tablet by mouth daily. 90 tablet 0   desloratadine (CLARINEX) 5 MG tablet Take 1 tablet (5 mg total) by mouth daily. (Patient not taking: Reported on 02/01/2022) 30 tablet 11   fluticasone (FLONASE) 50 MCG/ACT nasal spray Place 1 spray into both nostrils daily. (Patient not taking: Reported on 02/01/2022) 18.2 mL 6   hydrOXYzine (ATARAX) 50 MG tablet Take 0.5 tablets (25 mg total) by mouth as directed. Take 25 mg twice a day and 25 mg twice a day as needed for anxiety 180 tablet 0   Semaglutide-Weight Management (WEGOVY) 0.5 MG/0.5ML SOAJ Inject 0.5 mg into the skin once a week. (Patient not taking: Reported on 02/01/2022) 2 mL 0   Semaglutide-Weight Management (WEGOVY) 1 MG/0.5ML SOAJ Inject 1 mg into the skin once a week. (Patient not taking: Reported on 02/01/2022) 2 mL 0   No current facility-administered medications for this visit.     Musculoskeletal: Strength & Muscle Tone:  UTA Gait & Station:  Seated Patient leans: N/A  Psychiatric Specialty Exam: Review of Systems  Constitutional:  Positive for fatigue.  Respiratory:  Positive for shortness of breath.   Psychiatric/Behavioral:  Positive for sleep disturbance. The patient is nervous/anxious.   All other systems reviewed and are negative.   There were no vitals taken for this visit.There is no height or weight on file to calculate BMI.  General Appearance: Casual  Eye Contact:  Fair  Speech:  Normal Rate  Volume:  Normal  Mood:   Anxious  Affect:  Congruent  Thought Process:  Goal Directed and Descriptions of Associations: Intact  Orientation:  Full (Time, Place, and Person)  Thought Content: Logical   Suicidal Thoughts:  No  Homicidal Thoughts:  No  Memory:  Immediate;   Fair Recent;   Fair Remote;   Fair  Judgement:  Fair  Insight:  Fair  Psychomotor Activity:  Normal  Concentration:  Concentration: Fair and Attention Span: Fair  Recall:  [employer] of Knowledge: Fair  Language: Fair  Akathisia:  No  Handed:  Right  AIMS (if indicated): not done  Assets:  Communication Skills Desire for Improvement Housing Social Support  ADL's:  Intact  Cognition: WNL  Sleep:  Poor due to bronchitis , shortness of breath   Screenings: AIMS    Flowsheet Row Video Visit from 06/13/2021 in [facility] Office Visit from 05/05/2021 in [facility] Total Score 0 0      GAD-7    Flowsheet Row Counselor from 10/30/2021 in [place] at [facility] from 05/30/2021 in [place] at [facility] Video Visit from 05/26/2021 in [facility] Office Visit from 05/05/2021 in [place] Office Visit from 04/03/2021 in [place] at [facility]  Total GAD-7 Score '21 11 7 17 '$ 7  [place] Office Visit from 01/11/2022 in [facility] Video Visit from 01/02/2022 in [facility] Video Visit from 12/19/2021 in [place] Office Visit from 12/15/2021 in [facility] Counselor from 10/30/2021 in [place] at [facility] Total Score 0 0 '1 2 6  '$ PHQ-9 Total Score -- 4 -- 10 24      Flowsheet Row Video Visit  from 02/01/2022 in [place] Video Visit from 12/19/2021 in [place] Counselor from 10/30/2021 in [place] at [place] Low Risk Low Risk Error: Q3, 4, or 5 should not be populated when Q2 is No        Assessment and Plan: Barbara Pennington is a 42 year old Caucasian female who has a history of PTSD, borderline personality disorder, depression, GAD was evaluated by telemedicine today.  Patient currently struggling with bronchiolitis which does have an impact on her sleep, mood, will continue to benefit from following medication changes, continued psychotherapy sessions.  Plan as noted below.  Plan  PTSD-improving Lexapro 20 mg p.o. daily Wellbutrin XL 300 mg p.o. daily Continue CBT with Ms. Christina Hussami  GAD-unstable BuSpar 15 mg p.o. 3 times daily Lexapro 20 mg p.o. daily Gabapentin 300 mg p.o. 3 times daily prescribed by her provider for pain. Continue DBT Will not make any medication changes at this time since patient also struggling with bronchiolitis likely contributing to mood and sleep problems.  Skin picking disorder-unstable Patient agrees to discuss with therapist, will recommend psychotherapy sessions/CBT.  Patient may benefit from habit reversal therapy. Discussed to reduce the amount of hydroxyzine, reduce hydroxyzine to 25 mg twice daily and use 25 mg twice daily as needed for severe anxiety.  She can cut her hydroxyzine 50 mg tablets and a half to do so.  Long-term plan is to taper it down. Continue BuSpar 15 mg p.o. 3 times daily  History of borderline personality disorder-patient to continue to follow-up with her therapist.  History of alcohol abuse-patient has been sober since 2017.   Follow-up in clinic in 2 months or sooner if needed.  Collaboration of Care: Collaboration of Care: Primary Care Provider AEB encouraged to  follow up with primary care provider for bronchitis and Referral or follow-up with counselor/therapist AEB follow-up with therapist regarding mood, skin picking  Patient/Guardian was advised Release of Information must be obtained prior to any record release in order to collaborate their care with an outside provider. Patient/Guardian was advised if they have not already done so to contact the registration department to sign all necessary forms in order for [name] to release information regarding their care.   Consent: Patient/Guardian gives verbal consent for treatment and assignment of benefits for services provided during this visit. Patient/Guardian expressed understanding and agreed to proceed.    This note was generated in part or whole with voice recognition software. Voice recognition is usually quite accurate but there are transcription errors that can and very often do occur. I apologize for any typographical errors that were not detected and corrected.      Ursula Alert, MD 02/02/2022, 1:55 PM

## 2022-02-05 ENCOUNTER — Ambulatory Visit
Admission: RE | Admit: 2022-02-05 | Discharge: 2022-02-05 | Disposition: A | Discharge status: Home / Self Care | Payer: PRIVATE HEALTH INSURANCE | Source: Ambulatory Visit | Attending: Student in an Organized Health Care Education/Training Program | Admitting: Student in an Organized Health Care Education/Training Program

## 2022-02-05 DIAGNOSIS — R053 Chronic cough: Secondary | ICD-10-CM | POA: Diagnosis not present

## 2022-02-06 NOTE — Telephone Encounter (Signed)
Dr. Genia Harold, please advise on chest discomfort.   Rodena Piety, please schedule Echo. thanks

## 2022-02-06 NOTE — Telephone Encounter (Signed)
I have spoke with Barbara Pennington and her echo has been scheduled on 02/19/22 @ 9:00am at [place]

## 2022-02-13 ENCOUNTER — Ambulatory Visit: Payer: PRIVATE HEALTH INSURANCE | Attending: Student in an Organized Health Care Education/Training Program

## 2022-02-13 DIAGNOSIS — R053 Chronic cough: Secondary | ICD-10-CM | POA: Insufficient documentation

## 2022-02-13 DIAGNOSIS — Z87891 Personal history of nicotine dependence: Secondary | ICD-10-CM | POA: Diagnosis not present

## 2022-02-13 LAB — PULMONARY FUNCTION TEST ARMC ONLY
DL/VA % pred: 105 %
DL/VA: 4.65 ml/min/mmHg/L
DLCO unc % pred: 104 %
DLCO unc: 23.17 ml/min/mmHg
FEF 25-75 Post: 3.21 L/sec
FEF 25-75 Pre: 2.58 L/sec
FEF2575-%Change-Post: 24 %
FEF2575-%Pred-Post: 104 %
FEF2575-%Pred-Pre: 83 %
FEV1-%Change-Post: 5 %
FEV1-%Pred-Post: 88 %
FEV1-%Pred-Pre: 84 %
FEV1-Post: 2.69 L
FEV1-Pre: 2.54 L
FEV1FVC-%Change-Post: 0 %
FEV1FVC-%Pred-Pre: 100 %
FEV6-%Change-Post: 4 %
FEV6-%Pred-Post: 88 %
FEV6-%Pred-Pre: 84 %
FEV6-Post: 3.23 L
FEV6-Pre: 3.08 L
FEV6FVC-%Pred-Post: 102 %
FEV6FVC-%Pred-Pre: 102 %
FVC-%Change-Post: 5 %
FVC-%Pred-Post: 86 %
FVC-%Pred-Pre: 82 %
FVC-Post: 3.25 L
FVC-Pre: 3.08 L
Post FEV1/FVC ratio: 83 %
Post FEV6/FVC ratio: 100 %
Pre FEV1/FVC ratio: 83 %
Pre FEV6/FVC Ratio: 100 %
RV % pred: 149 %
RV: 2.47 L
TLC % pred: 118 %
TLC: 6.07 L

## 2022-02-13 MED ORDER — ALBUTEROL SULFATE (2.5 MG/3ML) 0.083% IN NEBU
2.5000 mg | INHALATION_SOLUTION | Freq: Once | RESPIRATORY_TRACT | Status: AC
  Administered 2022-02-13: 2.5 mg via RESPIRATORY_TRACT
  Filled 2022-02-13: qty 3

## 2022-02-19 ENCOUNTER — Ambulatory Visit
Admission: RE | Admit: 2022-02-19 | Discharge: 2022-02-19 | Disposition: A | Discharge status: Home / Self Care | Payer: PRIVATE HEALTH INSURANCE | Source: Ambulatory Visit | Attending: Student in an Organized Health Care Education/Training Program | Admitting: Student in an Organized Health Care Education/Training Program

## 2022-02-19 DIAGNOSIS — I10 Essential (primary) hypertension: Secondary | ICD-10-CM | POA: Insufficient documentation

## 2022-02-19 DIAGNOSIS — R0602 Shortness of breath: Secondary | ICD-10-CM | POA: Diagnosis not present

## 2022-02-19 DIAGNOSIS — E785 Hyperlipidemia, unspecified: Secondary | ICD-10-CM | POA: Diagnosis not present

## 2022-02-19 DIAGNOSIS — R0600 Dyspnea, unspecified: Secondary | ICD-10-CM | POA: Insufficient documentation

## 2022-02-19 DIAGNOSIS — I517 Cardiomegaly: Secondary | ICD-10-CM | POA: Insufficient documentation

## 2022-02-19 DIAGNOSIS — F4310 Post-traumatic stress disorder, unspecified: Secondary | ICD-10-CM | POA: Diagnosis not present

## 2022-02-19 LAB — ECHOCARDIOGRAM COMPLETE
AR max vel: 2.09 cm2
AV Area VTI: 2.22 cm2
AV Area mean vel: 2.05 cm2
AV Mean grad: 5.5 mmHg
AV Peak grad: 10.2 mmHg
Ao pk vel: 1.6 m/s
Area-P 1/2: 3.24 cm2
S' Lateral: 3.3 cm

## 2022-02-19 NOTE — Progress Notes (Signed)
*  PRELIMINARY RESULTS* Echocardiogram 2D Echocardiogram has been performed.  Sherrie Sport 02/19/2022, 9:52 AM

## 2022-02-28 ENCOUNTER — Ambulatory Visit (HOSPITAL_COMMUNITY): Payer: Medicaid Other | Admitting: Licensed Clinical Social Worker

## 2022-03-08 ENCOUNTER — Ambulatory Visit (INDEPENDENT_AMBULATORY_CARE_PROVIDER_SITE_OTHER): Payer: Medicaid Other | Admitting: Licensed Clinical Social Worker

## 2022-03-08 DIAGNOSIS — F411 Generalized anxiety disorder: Secondary | ICD-10-CM

## 2022-03-08 DIAGNOSIS — F4310 Post-traumatic stress disorder, unspecified: Secondary | ICD-10-CM

## 2022-03-08 NOTE — Progress Notes (Signed)
Virtual Visit via Video Note  I connected with Barbara Pennington on 03/08/22 at  1:00 PM EST by a video enabled telemedicine application and verified that I am speaking with the correct person using two identifiers.  Location: Patient: home Provider: remote office [place], [place])   I discussed the limitations of evaluation and management by telemedicine and the availability of in person appointments. The patient expressed understanding and agreed to proceed.  I discussed the assessment and treatment plan with the patient. The patient was provided an opportunity to ask questions and all were answered. The patient agreed with the plan and demonstrated an understanding of the instructions.   The patient was advised to call back or seek an in-person evaluation if the symptoms worsen or if the condition fails to improve as anticipated.  I provided 45 minutes of non-face-to-face time during this encounter.   Barbara Fowle R Dylanie Quesenberry, Barbara Pennington   THERAPIST PROGRESS NOTE  Session Time: 1-145p  Participation Level: Active  Behavioral Response: Neat and Well GroomedAlertDepressed  Type of Therapy: Individual Therapy  Treatment Goals addressed:  Develop healthy thinking patterns and beliefs about self, others, and the world that lead to the alleviation and help prevent the relapse of depression per self report 3 out of 5 sessions documented.   Learn and implement coping skills that result in a reduction of anxiety and worry, and improve daily functioning per pt report 3 out of 5 sessions documented    ProgressTowards Goals: Progressing  Interventions: CBT, Motivational Interviewing, and Supportive  Summary: Barbara Pennington is a 42 y.o. female who presents with continuing symptoms related to PTSD, depression, skin picking.  Patient states that she is experiencing acute stress associated with thoughts that she her employment may be terminated today.  Allowed pt to explore and express thoughts and  feelings associated with recent life situations and external stressors.  Allowed patient to explore why she feels that her appointment is going to be terminated today.  Patient reports that she has had 3 write ups recently, and feels that it is too much for her to keep her job.  Patient states that she has had ongoing conflict with some colleagues, and she feels that they are the reason that her supervisors are reaching out to her about her work-related tasks that possibly could have been offensive to others.  Patient states that she has always been the type of employee to voice her concerns to her supervisors, and to share information with her supervisors if she hears other staff members talking about them.  Patient states that she had some concerns after her last write up-patient requested to speak with her supervisor about it.  Patient states that the supervisor made the comment that they do not think that she should be worried about any potential consequences.  Patient states that she saw her supervisor meeting with someone in [place] today, so was the acute trigger for patient feeling anxious currently.   Patient states that some of her coping skills include-going out with boyfriend to dinner, recreationally gambling, going to a chiropractor, watching movies, buying new shoes, being intentional about focusing on self.  Reviewed coping skills for managing anxiety and managing anger and provided patient with psychoeducational materials in her after visit summary.  Continued recommendations are as follows: self care behaviors, positive social engagements, focusing on overall work/home/life balance, and focusing on positive physical and emotional wellness.   Suicidal/Homicidal: No  Therapist Response: Pt is continuing to apply interventions learned in session  into daily life situations. Pt is currently on track to meet goals utilizing interventions mentioned above. Personal growth and progress noted.  Treatment to continue as indicated.   Plan: Return again in 4 weeks.  Diagnosis:  No diagnosis found.   Collaboration of Care: Patient refused AEB pt is scheduled to start psychiatric treatment with Dr. Ursula Pennington . Pt had psychiatric services w/ [facility] in past.   Patient/Guardian was advised Release of Information must be obtained prior to any record release in order to collaborate their care with an outside provider. Patient/Guardian was advised if they have not already done so to contact the registration department to sign all necessary forms in order for [name] to release information regarding their care.   Consent: Patient/Guardian gives verbal consent for treatment and assignment of benefits for services provided during this visit. Patient/Guardian expressed understanding and agreed to proceed.   [facility], Barbara Pennington 03/08/2022

## 2022-03-08 NOTE — Patient Instructions (Addendum)
ANXIETY Fighting this does not help--so I will just relax and breathe deeply and let it float away This feeling is not comfortable but I can handle it By relaxing through these feelings, I learned to face my fears I can feel anxious and still deal with this situation This is not a real emergency--I can slow down and think about what I need to do This feeling will go away By staying present and focused on my task--my anxiety will decrease These are just thoughts--not reality Anxiety will not hurt me Feeling tense is natural.  It tells me it is time to use coping strategies Things are not as bad as I am making them out to be Do not discount the positives  FEAR:   I've done this before so I can do it again I'll be glad I did it when this is over I'll feel better when I am actually in the situation I'll just do the best I can By facing my fears I can overcome them Worry doesn't help Whatever happens, happens. I can handle it   FEELING OVERWHELMED  Stay focused on the present. What do I need to do right now?  It will soon be over Its not the worst thing that could happen Step by step until its over I don't need to eliminate stress, just keep it under control  Once I label my stress from 1-10 and I can watch it go down Take a breath   COPING STATEMENTS FOR PANIC  This is not dangerous I will just let my body pass through this I have survived panic attacks before and I will survive this as well Nothing serious is going to happen This will pass  COPING STATEMENTS FOR ANGER MANAGEMENT  It is not worth getting mad about I will not take this personally I am in charge--not my anger I am going to breathe slowly until I know what to do Getting angry is not going to help I can handle this and stay in control Remember to breathe--remember to breathe People are not against me--they are for themselves

## 2022-03-19 ENCOUNTER — Telehealth: Payer: Self-pay | Admitting: Family Medicine

## 2022-03-19 NOTE — Progress Notes (Unsigned)
Name: Barbara Pennington   MRN: [id number]    DOB: 27-May-1980   Date:03/20/2022       Progress Note  Subjective  Chief Complaint  Follow Up  HPI  Morbid Obesity: her weight was 180 lbs when she graduated HS, when she graduated college/after birth of her first child her weight was up 256 lbs (January 2004 after his birth), she lost weight between 2009-2010 due to severe depression down to around 180 lbs, she gradually started to gain weight again and stay around 220 lbs around 216 lbs, but had plantar fascitis and had surgery so she gained weight after that. In 2019 she injury her back at work and lack of activity also caused more weight gain.Her heaviest weight is today at 257 lbs  She tried Rybelsus samples 3 mg and was able to lose 7 lbs, GLP-1 agonist would be very beneficial for her.  She has tried metformin in the past and it caused diarrhea  She is morbidly obese with OSA, OA, GERD, CKI, glucose intolerance and dyslipidemia  She has tried [employer] and [employer] ,Rickard Patience She is trying to eat more fruit and vegetables, cutting down on portion size , eating earlier  She has been walking   Metabolic syndrome with obesity: responded well to GLP-1 in the past for pre-diabetes. We gave her Mancel Parsons last visit but nation wide shortage, she only took one month but never started the 0.5 mg dose   HTN/ Chronic kidney disease:She states she is following up with Nephrology for monitoring. She is compliant with medication, taking norvasc 2.5 mg and valsartan hctz 320/12.5 mg , we will continue current dose but if bp spikes with modafinil we will adjust dose of norvasc to 5 mg   History of right knee surgery: done 07/17, had right meniscal repair and patella chondroplasty. She is able to walk now without problems , still under PT   Chronic back pain: she states standing causes increase of pain, even sitting has to shift positions frequently. She had a stand up desk, but was given to another  employee. Aching pain, worse on right lower back, no radiculitis. She states she is doing better since she started to see a chiropractor   CKI : stage III, r eminded her to not take nsaid's Good urine output , no pruritus   Stress incontinence: going on for about one year, happens when she coughs or sneezes or even when laughing , she is wiling to have PT   Depression/GAD/personality disorder: under the care of Dr. Shea Evans, compliant with medications and states feeling well this week, phq 9 is negative   Asthma mild intermittent: she has noticed increase in cough and sob since neighbors smoke cigarettes and pot. We changed to symbicort she states she prefers using albuterol but currently only using it prn  OSA: she has been using CPAP she states wears it every night but still has severe daytime somnolence and sometimes dozes off while driving. We will give her modafinil, discussed it may cause elevation of bp and we will need to see her in one month   Patient Active Problem List   Diagnosis Date Noted   Chronic cough 02/13/2022   Skin-picking disorder 12/19/2021   Adjustment disorder with depressed mood 06/13/2021   Closed nondisplaced longitudinal fracture of right patella 06/09/2021   GAD (generalized anxiety disorder) 05/05/2021   Depressive disorder 05/05/2021   History of borderline personality disorder 05/05/2021   History of alcohol abuse 05/05/2021  Internal derangement of left knee 04/03/2021   Primary osteoarthritis of left knee 99991111   Metabolic syndrome [id number]   Impaired fasting glucose 12/06/2020   Lymphedema 12/06/2020   Moderate episode of recurrent major depressive disorder ([place]) 02/08/2020   CKD (chronic kidney disease) stage 3, GFR 30-59 ml/min (HCC) 06/11/2019   PTSD (post-traumatic stress disorder) 01/14/2019   Borderline personality disorder ([place]) 01/14/2019   Anxiety 01/14/2019   Mixed hyperlipidemia 01/14/2019   Morbid obesity with BMI of 50.0-59.9,  adult ([place]) 01/14/2019   OSA on CPAP 01/14/2019   Asthma, mild persistent 04/30/2018   Low back pain 08/20/2017   Fibroadenoma of breast, 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   Essential hypertension     Past Surgical History:  Procedure Laterality Date   ABDOMINAL HYSTERECTOMY  01/08/2006   BREAST BIOPSY Left 01/09/2011   CORE - FIBROADENOMA VS PHYLLODES   CESAREAN SECTION  01/08/2005   COLONOSCOPY WITH PROPOFOL N/A 10/09/2016   Procedure: COLONOSCOPY WITH PROPOFOL;  Surgeon: Jonathon Bellows, MD;  Location: [facility] ENDOSCOPY;  Service: Gastroenterology;  Laterality: N/A;   COLONOSCOPY WITH PROPOFOL N/A 11/09/2019   Procedure: COLONOSCOPY WITH PROPOFOL;  Surgeon: Lin Landsman, MD;  Location: [facility] ENDOSCOPY;  Service: Gastroenterology;  Laterality: N/A;   ESOPHAGOGASTRODUODENOSCOPY     GUM SURGERY  01/08/2002   KNEE ARTHROSCOPY WITH MEDIAL MENISECTOMY Right 07/24/2021   Procedure: Right medial meniscus root repair and patella chondroplasty;  Surgeon: Leim Fabry, MD;  Location: [facility] ORS;  Service: Orthopedics;  Laterality: Right;   PLANTAR FASCIA RELEASE Left 08/19/2014   Procedure: ENDOSCOPIC PLANTAR FASCIOTOMY RELEASE L WITH TOPAZ;  Surgeon: Albertine Patricia, DPM;  Location: [place];  Service: Podiatry;  Laterality: Left;  LMA WITH POPLITEAL BLOCK   TONSILLECTOMY  01/08/1982    Family History  Problem Relation Age of Onset   Obesity Mother    Diabetes Mother    High Cholesterol Mother    Hypertension Mother    Neuropathy Mother    Depression Mother    Anxiety disorder Mother    Obesity Father    Hypertension Father    High Cholesterol Father    Depression Father    Anxiety disorder Father    Seizures Brother    Testicular cancer Brother    Breast cancer Maternal Grandmother    Hypertension Maternal Grandmother    Aneurysm Maternal Grandmother    Stroke Maternal Grandmother    Hypertension Maternal Grandfather    High Cholesterol Maternal Grandfather    Heart disease Maternal  Grandfather        has a pacemaker   Stroke Maternal Grandfather    Alzheimer's disease Paternal Grandmother    Dementia Paternal Grandmother    Colon cancer Paternal Grandfather    Mental illness Maternal Aunt    Suicidality Cousin     Social History   Tobacco Use   Smoking status: Former    Packs/day: 0.25    Years: 10.00    Total pack years: 2.50    Types: Cigarettes    Quit date: 07/05/2014    Years since quitting: 7.7   Smokeless tobacco: Never  Substance Use Topics   Alcohol use: Yes    Comment: rare     Current Outpatient Medications:    acetaminophen (TYLENOL) 500 MG tablet, Take 2 tablets (1,000 mg total) by mouth every 8 (eight) hours., Disp: 90 tablet, Rfl: 2   albuterol (VENTOLIN HFA) 108 (90 Base) MCG/ACT inhaler, Inhale 2 puffs into the lungs every 4 (four) hours as needed  for wheezing or shortness of breath., Disp: 9 g, Rfl: 0   amLODipine (NORVASC) 2.5 MG tablet, Take 1 tablet (2.5 mg total) by mouth every morning., Disp: 90 tablet, Rfl: 1   atorvastatin (LIPITOR) 20 MG tablet, Take 1 tablet (20 mg total) by mouth every morning., Disp: 90 tablet, Rfl: 1   budesonide-formoterol (SYMBICORT) 160-4.5 MCG/ACT inhaler, Inhale 2 puffs into the lungs 2 (two) times daily., Disp: 3 each, Rfl: 0   buPROPion (WELLBUTRIN XL) 300 MG 24 hr tablet, Take 1 tablet (300 mg total) by mouth every morning., Disp: 90 tablet, Rfl: 0   busPIRone (BUSPAR) 15 MG tablet, Take 1 tablet (15 mg total) by mouth 3 (three) times daily., Disp: 270 tablet, Rfl: 0   desloratadine (CLARINEX) 5 MG tablet, Take 1 tablet (5 mg total) by mouth daily., Disp: 30 tablet, Rfl: 11   escitalopram (LEXAPRO) 20 MG tablet, Take 1 tablet (20 mg total) by mouth daily., Disp: 90 tablet, Rfl: 0   fluticasone (FLONASE) 50 MCG/ACT nasal spray, Place 1 spray into both nostrils daily., Disp: 18.2 mL, Rfl: 6   gabapentin (NEURONTIN) 300 MG capsule, Take 1 capsule by mouth 3 (three) times daily., Disp: , Rfl:     hydrOXYzine (ATARAX) 50 MG tablet, Take 0.5 tablets (25 mg total) by mouth as directed. Take 25 mg twice a day and 25 mg twice a day as needed for anxiety, Disp: 180 tablet, Rfl: 0   ipratropium-albuterol (DUONEB) 0.5-2.5 (3) MG/3ML SOLN, Take 3 mLs by nebulization 3 (three) times daily as needed (asthma/bronchitis sx, cough wheeze sob)., Disp: 180 mL, Rfl: 1   Melatonin 10 MG CAPS, Take 10-20 mg by mouth at bedtime., Disp: , Rfl:    metaxalone (SKELAXIN) 800 MG tablet, Take 1 tablet (800 mg total) by mouth 3 (three) times daily as needed for muscle spasms., Disp: 270 tablet, Rfl: 0   montelukast (SINGULAIR) 10 MG tablet, Take 1 tablet (10 mg total) by mouth at bedtime., Disp: 90 tablet, Rfl: 1   Semaglutide-Weight Management (WEGOVY) 0.5 MG/0.5ML SOAJ, Inject 0.5 mg into the skin once a week., Disp: 2 mL, Rfl: 0   Semaglutide-Weight Management (WEGOVY) 1 MG/0.5ML SOAJ, Inject 1 mg into the skin once a week., Disp: 2 mL, Rfl: 0   valsartan-hydrochlorothiazide (DIOVAN-HCT) 320-12.5 MG tablet, Take 1 tablet by mouth daily., Disp: 90 tablet, Rfl: 0  Allergies  Allergen Reactions   Latex Rash   Metformin And Related     diarrhea    I personally reviewed active problem list, medication list, allergies, family history, social history, health maintenance with the patient/caregiver today.   ROS  Constitutional: Negative for fever or weight change.  Respiratory: Negative for cough and shortness of breath.   Cardiovascular: Negative for chest pain or palpitations.  Gastrointestinal: Negative for abdominal pain, no bowel changes.  Musculoskeletal: Negative for gait problem or joint swelling.  Skin: Negative for rash.  Neurological: Negative for dizziness or headache.  No other specific complaints in a complete review of systems (except as listed in HPI above).   Objective  Vitals:   03/20/22 0743  BP: 130/80  Pulse: 94  Resp: 16  SpO2: 97%  Weight: (!) 357 lb (161.9 kg)  Height: '5\' 4"'$   (1.626 m)    Body mass index is 61.28 kg/m.  Physical Exam  Constitutional: Patient appears well-developed and well-nourished. Obese  No distress.  HEENT: head atraumatic, normocephalic, pupils equal and reactive to light, neck supple Cardiovascular: Normal rate, regular rhythm and  normal heart sounds.  No murmur heard. No BLE edema. Pulmonary/Chest: Effort normal and breath sounds normal. No respiratory distress. Abdominal: Soft.  There is no tenderness. Psychiatric: Patient has a normal mood and affect. behavior is normal. Judgment and thought content normal.   Recent Results (from the past 2160 hour(s))  Pulmonary Function Test [facility] Only     Status: None   Collection Time: 02/13/22  3:39 PM  Result Value Ref Range   FVC-Pre 3.08 L   FVC-%Pred-Pre 82 %   FVC-Post 3.25 L   FVC-%Pred-Post 86 %   FVC-%Change-Post 5 %   FEV1-Pre 2.54 L   FEV1-%Pred-Pre 84 %   FEV1-Post 2.69 L   FEV1-%Pred-Post 88 %   FEV1-%Change-Post 5 %   FEV6-Pre 3.08 L   FEV6-%Pred-Pre 84 %   FEV6-Post 3.23 L   FEV6-%Pred-Post 88 %   FEV6-%Change-Post 4 %   Pre FEV1/FVC ratio 83 %   FEV1FVC-%Pred-Pre 100 %   Post FEV1/FVC ratio 83 %   FEV1FVC-%Change-Post 0 %   Pre FEV6/FVC Ratio 100 %   FEV6FVC-%Pred-Pre 102 %   Post FEV6/FVC ratio 100 %   FEV6FVC-%Pred-Post 102 %   FEF 25-75 Pre 2.58 L/sec   FEF2575-%Pred-Pre 83 %   FEF 25-75 Post 3.21 L/sec   FEF2575-%Pred-Post 104 %   FEF2575-%Change-Post 24 %   RV 2.47 L   RV % pred 149 %   TLC 6.07 L   TLC % pred 118 %   DLCO unc 23.17 ml/min/mmHg   DLCO unc % pred 104 %   DL/VA 4.65 ml/min/mmHg/L   DL/VA % pred 105 %  ECHOCARDIOGRAM COMPLETE     Status: None   Collection Time: 02/19/22  9:52 AM  Result Value Ref Range   Ao pk vel 1.60 m/s   AV Area VTI 2.22 cm2   AR max vel 2.09 cm2   AV Mean grad 5.5 mmHg   AV Peak grad 10.2 mmHg   S' Lateral 3.30 cm   AV Area mean vel 2.05 cm2   Area-P 1/2 3.24 cm2   Est EF 55 - 60%     PHQ2/9:     03/20/2022    7:43 AM 01/11/2022    8:03 AM 01/02/2022   10:21 AM 12/19/2021   10:15 AM 12/15/2021    7:42 AM  Depression screen PHQ 2/9  Decreased Interest 0 0 0  1  Down, Depressed, Hopeless 0 0 0  1  PHQ - 2 Score 0 0 0  2  Altered sleeping '3  1  2  '$ Tired, decreased energy '3  2  2  '$ Change in appetite 0  0  0  Feeling bad or failure about yourself  0  0  1  Trouble concentrating 0  1  3  Moving slowly or fidgety/restless 0  0  0  Suicidal thoughts 0  0  0  PHQ-9 Score '6  4  10     '$ Information is confidential and restricted. Go to Review Flowsheets to unlock data.    phq 9 is negative   Fall Risk:    03/20/2022    7:42 AM 01/11/2022    8:02 AM 01/02/2022   10:21 AM 12/15/2021    7:42 AM 09/25/2021   11:30 AM  Fall Risk   Falls in the past year? '1 1 1 1 1  '$ Number falls in past yr: '1 1 1 1 1  '$ Injury with Fall? 1 1 0 0 1  Risk  for fall due to : No Fall Risks History of fall(s) No Fall Risks No Fall Risks No Fall Risks  Follow up Falls prevention discussed Falls evaluation completed Falls prevention discussed Falls prevention discussed Falls prevention discussed      Functional Status Survey: Is the patient deaf or have difficulty hearing?: No Does the patient have difficulty seeing, even when wearing glasses/contacts?: No Does the patient have difficulty concentrating, remembering, or making decisions?: Yes Does the patient have difficulty walking or climbing stairs?: Yes Does the patient have difficulty dressing or bathing?: No Does the patient have difficulty doing errands alone such as visiting a doctor's office or shopping?: No    Assessment & Plan  1. OSA on CPAP  - modafinil (PROVIGIL) 100 MG tablet; Take 1 tablet (100 mg total) by mouth in the morning.  Dispense: 30 tablet; Refill: 0  2. Morbid obesity with BMI of 50.0-59.9, adult (HCC)  - Semaglutide-Weight Management (WEGOVY) 0.25 MG/0.5ML SOAJ; Inject 0.25 mg into the skin once a week.  Dispense: 2 mL;  Refill: 0  3. Stage 3a chronic kidney disease (HCC)  We will monitor   4. Mild persistent asthma without complication  - montelukast (SINGULAIR) 10 MG tablet; Take 1 tablet (10 mg total) by mouth at bedtime.  Dispense: 90 tablet; Refill: 1  5. Daytime somnolence  - modafinil (PROVIGIL) 100 MG tablet; Take 1 tablet (100 mg total) by mouth in the morning.  Dispense: 30 tablet; Refill: 0  6. Chronic bilateral low back pain without sciatica  Continue chiropractor   7. History of borderline personality disorder   8. Essential hypertension  We will monitor bp  9. Primary osteoarthritis of left knee   10. Mixed hyperlipidemia  - atorvastatin (LIPITOR) 20 MG tablet; Take 1 tablet (20 mg total) by mouth every morning.  Dispense: 90 tablet; Refill: 1

## 2022-03-19 NOTE — Telephone Encounter (Signed)
Copied from [facility] [id number]. Topic: General - Other >> Mar 19, 2022 10:11 AM Charlotte Sanes J wrote: Reason for CRM: Pt has appt tomorrow morning / pt asked if blood work will be done and if so she wants to do this this afternoon at [facility] and if orders can be sent today before tomorrows appt  / please advise

## 2022-03-20 ENCOUNTER — Encounter: Payer: Self-pay | Admitting: Family Medicine

## 2022-03-20 ENCOUNTER — Ambulatory Visit (INDEPENDENT_AMBULATORY_CARE_PROVIDER_SITE_OTHER): Payer: PRIVATE HEALTH INSURANCE | Admitting: Family Medicine

## 2022-03-20 VITALS — BP 130/80 | HR 94 | Resp 16 | Ht 64.0 in | Wt 357.0 lb

## 2022-03-20 DIAGNOSIS — M545 Low back pain, unspecified: Secondary | ICD-10-CM

## 2022-03-20 DIAGNOSIS — J4530 Mild persistent asthma, uncomplicated: Secondary | ICD-10-CM | POA: Diagnosis not present

## 2022-03-20 DIAGNOSIS — G4733 Obstructive sleep apnea (adult) (pediatric): Secondary | ICD-10-CM | POA: Diagnosis not present

## 2022-03-20 DIAGNOSIS — E782 Mixed hyperlipidemia: Secondary | ICD-10-CM

## 2022-03-20 DIAGNOSIS — Z8659 Personal history of other mental and behavioral disorders: Secondary | ICD-10-CM

## 2022-03-20 DIAGNOSIS — Z6841 Body Mass Index (BMI) 40.0 and over, adult: Secondary | ICD-10-CM

## 2022-03-20 DIAGNOSIS — N1831 Chronic kidney disease, stage 3a: Secondary | ICD-10-CM

## 2022-03-20 DIAGNOSIS — I10 Essential (primary) hypertension: Secondary | ICD-10-CM

## 2022-03-20 DIAGNOSIS — G8929 Other chronic pain: Secondary | ICD-10-CM

## 2022-03-20 DIAGNOSIS — M1712 Unilateral primary osteoarthritis, left knee: Secondary | ICD-10-CM

## 2022-03-20 DIAGNOSIS — R400 Somnolence: Secondary | ICD-10-CM

## 2022-03-20 MED ORDER — MODAFINIL 100 MG PO TABS: 100.0000 mg | ORAL_TABLET | Freq: Every morning | ORAL | 0 refills | Status: DC

## 2022-03-20 MED ORDER — ATORVASTATIN CALCIUM 20 MG PO TABS: 20.0000 mg | ORAL_TABLET | ORAL | 1 refills | Status: DC

## 2022-03-20 MED ORDER — WEGOVY 0.25 MG/0.5ML ~~LOC~~ SOAJ: 0.2500 mg | SUBCUTANEOUS | 0 refills | Status: DC

## 2022-03-20 MED ORDER — MONTELUKAST SODIUM 10 MG PO TABS: 10.0000 mg | ORAL_TABLET | Freq: Every day | ORAL | 1 refills | Status: DC

## 2022-04-02 ENCOUNTER — Telehealth (INDEPENDENT_AMBULATORY_CARE_PROVIDER_SITE_OTHER): Payer: Medicaid Other | Admitting: Psychiatry

## 2022-04-02 ENCOUNTER — Encounter: Payer: Self-pay | Admitting: Psychiatry

## 2022-04-02 DIAGNOSIS — Z8659 Personal history of other mental and behavioral disorders: Secondary | ICD-10-CM

## 2022-04-02 DIAGNOSIS — F411 Generalized anxiety disorder: Secondary | ICD-10-CM

## 2022-04-02 DIAGNOSIS — F1011 Alcohol abuse, in remission: Secondary | ICD-10-CM | POA: Diagnosis not present

## 2022-04-02 DIAGNOSIS — F4310 Post-traumatic stress disorder, unspecified: Secondary | ICD-10-CM | POA: Diagnosis not present

## 2022-04-02 DIAGNOSIS — F424 Excoriation (skin-picking) disorder: Secondary | ICD-10-CM | POA: Diagnosis not present

## 2022-04-02 MED ORDER — BUSPIRONE HCL 15 MG PO TABS: 15.0000 mg | ORAL_TABLET | Freq: Three times a day (TID) | ORAL | 1 refills | Status: DC

## 2022-04-02 MED ORDER — ESCITALOPRAM OXALATE 20 MG PO TABS: 20.0000 mg | ORAL_TABLET | Freq: Every day | ORAL | 1 refills | Status: DC

## 2022-04-02 MED ORDER — BUPROPION HCL ER (XL) 150 MG PO TB24: 150.0000 mg | ORAL_TABLET | Freq: Every day | ORAL | 0 refills | Status: DC

## 2022-04-02 NOTE — Progress Notes (Unsigned)
Virtual Visit via Video Note  I connected with Barbara Pennington on 04/02/22 at 11:00 AM EDT by a video enabled telemedicine application and verified that I am speaking with the correct person using two identifiers.  Location Provider Location : Remote office Patient Location : Home  Participants: Patient , Provider    I discussed the limitations of evaluation and management by telemedicine and the availability of in person appointments. The patient expressed understanding and agreed to proceed.   I discussed the assessment and treatment plan with the patient. The patient was provided an opportunity to ask questions and all were answered. The patient agreed with the plan and demonstrated an understanding of the instructions.   The patient was advised to call back or seek an in-person evaluation if the symptoms worsen or if the condition fails to improve as anticipated.   [place] MD OP Progress Note  04/03/2022 7:40 AM [name]  MRN:  [id number]  Chief Complaint:  Chief Complaint  Patient presents with   Follow-up   Anxiety   Depression   HPI: Barbara Pennington is a 42 year old Caucasian female, divorced, employed, lives in [place], has a history of PTSD, GAD, history of borderline personality disorder, morbid obesity, OSA on CPAP, hypertension/chronic kidney disease, asthma, chronic back pain, status post right sided knee surgery, presented for medication management.  Patient today reports she has noticed an improvement in her anxiety, skin picking since the past several days.  Patient reports she also has noticed some improvement in her fatigue and low energy during the day since being started on modafinil by her primary care provider.  This was just started a week ago.  She however ran out of her Wellbutrin ,she was on 300 mg daily.  She reports she never got the [facility] which [facility].  Patient reports she is interested in going back on the Wellbutrin.  Patient  is currently compliant with psychotherapy.  Reports therapy sessions are beneficial.  Patient reports sleep has improved.  Denies any suicidality, homicidality or perceptual disturbances.  Patient is compliant on the Lexapro, BuSpar.  Denies side effects.  Patient denies any other concerns today.  Visit Diagnosis:    ICD-10-CM   1. PTSD (post-traumatic stress disorder)  F43.10 buPROPion (WELLBUTRIN XL) 150 MG 24 hr tablet    busPIRone (BUSPAR) 15 MG tablet    2. GAD (generalized anxiety disorder)  F41.1 escitalopram (LEXAPRO) 20 MG tablet    busPIRone (BUSPAR) 15 MG tablet    3. 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 History of borderline personality disorder  Z86.59 escitalopram (LEXAPRO) 20 MG tablet    5. History of alcohol abuse  F10.11       Past Psychiatric History: I have reviewed past psychiatric history from progress note on 05/05/2021.  Patient was previously under the care of [facility].  Past Medical History:  Past Medical History:  Diagnosis Date   Anxiety    Arthritis 2006   rhuematoid - mild - no current issues   Asthma 2003   Borderline personality disorder ([place])    Chronic kidney disease    stage 3   Complication of anesthesia    "Usually" has low temp after surgery.After hysterectomy temp was 94   Depression    GERD (gastroesophageal reflux disease)    History of kidney stones    Hyperlipidemia    Hypertension 2013   Lump or mass in breast 2013   RIGHT BREAST  Motion sickness    repeated amusement park rides   PTSD (post-traumatic stress disorder)    Scoliosis    no current issues   Shortness of breath dyspnea    secondary to weight    Sleep apnea    uses cpap   Ulcer 2001    Past Surgical History:  Procedure Laterality Date   ABDOMINAL HYSTERECTOMY  01/08/2006   BREAST BIOPSY Left 01/09/2011   CORE - FIBROADENOMA VS PHYLLODES   CESAREAN SECTION  01/08/2005   COLONOSCOPY WITH PROPOFOL N/A 10/09/2016   Procedure: COLONOSCOPY WITH PROPOFOL;   Surgeon: Jonathon Bellows, MD;  Location[facility] ENDOSCOPY;  Service: Gastroenterology;  Laterality: N/A;   COLONOSCOPY WITH PROPOFOL N/A 11/09/2019   Procedure: COLONOSCOPY WITH PROPOFOL;  Surgeon: Lin Landsman, MD;  Location: [facility] ENDOSCOPY;  Service: Gastroenterology;  Laterality: N/A;   ESOPHAGOGASTRODUODENOSCOPY     GUM SURGERY  01/08/2002   KNEE ARTHROSCOPY WITH MEDIAL MENISECTOMY Right 07/24/2021   Procedure: Right medial meniscus root repair and patella chondroplasty;  Surgeon: Leim Fabry, MD;  Location: [facility] ORS;  Service: Orthopedics;  Laterality: Right;   PLANTAR FASCIA RELEASE Left 08/19/2014   Procedure: ENDOSCOPIC PLANTAR FASCIOTOMY RELEASE L WITH TOPAZ;  Surgeon: Albertine Patricia, DPM;  Location: [facility];  Service: Podiatry;  Laterality: Left;  LMA WITH POPLITEAL BLOCK   TONSILLECTOMY  01/08/1982    Family Psychiatric History: Reviewed family psychiatric history from progress note on 05/05/2021.  Family History:  Family History  Problem Relation Age of Onset   Obesity Mother    Diabetes Mother    High Cholesterol Mother    Hypertension Mother    Neuropathy Mother    Depression Mother    Anxiety disorder Mother    Obesity Father    Hypertension Father    High Cholesterol Father    Depression Father    Anxiety disorder Father    Seizures Brother    Testicular cancer Brother    Breast cancer Maternal Grandmother    Hypertension Maternal Grandmother    Aneurysm Maternal Grandmother    Stroke Maternal Grandmother    Hypertension Maternal Grandfather    High Cholesterol Maternal Grandfather    Heart disease Maternal Grandfather        has a pacemaker   Stroke Maternal Grandfather    Alzheimer's disease Paternal Grandmother    Dementia Paternal Grandmother    Colon cancer Paternal Grandfather    Mental illness Maternal Aunt    Suicidality Cousin     Social History: Reviewed social history from progress note on 05/05/2021. Social History    Socioeconomic History   Marital status: Divorced    Spouse name: Not on file   Number of children: 2   Years of education: 14   Highest education level: Bachelor's degree (e.g., BA, AB, BS)  Occupational History   Not on file  Tobacco Use   Smoking status: Former    Packs/day: 0.25    Years: 10.00    Additional pack years: 0.00    Total pack years: 2.50    Types: Cigarettes    Quit date: 07/05/2014    Years since quitting: 7.7   Smokeless tobacco: Never  Vaping Use   Vaping Use: Never used  Substance and Sexual Activity   Alcohol use: Yes    Comment: rare   Drug use: No   Sexual activity: Yes    Partners: Male    Birth control/protection: Surgical    Comment: 2007  Other Topics Concern  Not on file  Social History Narrative   Not on file   Social Determinants of Health   Financial Resource Strain: High Risk (05/31/2020)   Overall Financial Resource Strain (CARDIA)    Difficulty of Paying Living Expenses: Hard  Food Insecurity: No Food Insecurity (05/31/2020)   Hunger Vital Sign    Worried About Running Out of Food in the Last Year: Never true    Ran Out of Food in the Last Year: Never true  Transportation Needs: No Transportation Needs (05/31/2020)   PRAPARE - Hydrologist (Medical): No    Lack of Transportation (Non-Medical): No  Physical Activity: Unknown (05/31/2020)   Exercise Vital Sign    Days of Exercise per Week: 0 days    Minutes of Exercise per Session: Not on file  Stress: Stress Concern Present (05/31/2020)   [place]    Feeling of Stress : To some extent  Social Connections: Socially Isolated (05/31/2020)   Social Connection and Isolation Panel [NHANES]    Frequency of Communication with Friends and Family: More than three times a week    Frequency of Social Gatherings with Friends and Family: Never    Attends Religious Services: Never    Museum/gallery conservator or Organizations: No    Attends Music therapist: Not on file    Marital Status: Divorced    Allergies:  Allergies  Allergen Reactions   Latex Rash   Metformin And Related     diarrhea    Metabolic Disorder Labs: Lab Results  Component Value Date   HGBA1C 5.5 12/15/2021   MPG 111 12/15/2021   MPG 105 12/06/2020   No results found for: "PROLACTIN" Lab Results  Component Value Date   CHOL 186 12/15/2021   TRIG 73 12/15/2021   HDL 60 12/15/2021   CHOLHDL 3.1 12/15/2021   VLDL 27 08/06/2019   LDLCALC 110 (H) 12/15/2021   LDLCALC 111 (H) 12/06/2020   Lab Results  Component Value Date   TSH 1.83 12/15/2021   TSH 2.59 04/04/2018    Therapeutic Level Labs: No results found for: "LITHIUM" No results found for: "VALPROATE" No results found for: "CBMZ"  Current Medications: Current Outpatient Medications  Medication Sig Dispense Refill   acetaminophen (TYLENOL) 500 MG tablet Take 2 tablets (1,000 mg total) by mouth every 8 (eight) hours. 90 tablet 2   albuterol (VENTOLIN HFA) 108 (90 Base) MCG/ACT inhaler Inhale 2 puffs into the lungs every 4 (four) hours as needed for wheezing or shortness of breath. 9 g 0   amLODipine (NORVASC) 2.5 MG tablet Take 1 tablet (2.5 mg total) by mouth every morning. 90 tablet 1   atorvastatin (LIPITOR) 20 MG tablet Take 1 tablet (20 mg total) by mouth every morning. 90 tablet 1   buPROPion (WELLBUTRIN XL) 150 MG 24 hr tablet Take 1 tablet (150 mg total) by mouth daily with breakfast. Stop wellbutrin xl 300 mg 90 tablet 0   desloratadine (CLARINEX) 5 MG tablet Take 1 tablet (5 mg total) by mouth daily. 30 tablet 11   fluticasone (FLONASE) 50 MCG/ACT nasal spray Place 1 spray into both nostrils daily. 18.2 mL 6   gabapentin (NEURONTIN) 300 MG capsule Take 1 capsule by mouth 3 (three) times daily.     hydrOXYzine (ATARAX) 50 MG tablet Take 0.5 tablets (25 mg total) by mouth as directed. Take 25 mg twice a day and 25 mg twice a  day as needed for anxiety 180 tablet 0   ipratropium-albuterol (DUONEB) 0.5-2.5 (3) MG/3ML SOLN Take 3 mLs by nebulization 3 (three) times daily as needed (asthma/bronchitis sx, cough wheeze sob). 180 mL 1   Melatonin 10 MG CAPS Take 10-20 mg by mouth at bedtime.     metaxalone (SKELAXIN) 800 MG tablet Take 1 tablet (800 mg total) by mouth 3 (three) times daily as needed for muscle spasms. 270 tablet 0   modafinil (PROVIGIL) 100 MG tablet Take 1 tablet (100 mg total) by mouth in the morning. 30 tablet 0   montelukast (SINGULAIR) 10 MG tablet Take 1 tablet (10 mg total) by mouth at bedtime. 90 tablet 1   valsartan-hydrochlorothiazide (DIOVAN-HCT) 320-12.5 MG tablet Take 1 tablet by mouth daily. 90 tablet 0   budesonide-formoterol (SYMBICORT) 160-4.5 MCG/ACT inhaler Inhale 2 puffs into the lungs 2 (two) times daily. (Patient not taking: Reported on 04/02/2022) 3 each 0   busPIRone (BUSPAR) 15 MG tablet Take 1 tablet (15 mg total) by mouth 3 (three) times daily. 270 tablet 1   escitalopram (LEXAPRO) 20 MG tablet Take 1 tablet (20 mg total) by mouth daily. 90 tablet 1   Semaglutide-Weight Management (WEGOVY) 0.25 MG/0.5ML SOAJ Inject 0.25 mg into the skin once a week. (Patient not taking: Reported on 04/02/2022) 2 mL 0   Semaglutide-Weight Management (WEGOVY) 0.5 MG/0.5ML SOAJ Inject 0.5 mg into the skin once a week. (Patient not taking: Reported on 04/02/2022) 2 mL 0   Semaglutide-Weight Management (WEGOVY) 1 MG/0.5ML SOAJ Inject 1 mg into the skin once a week. (Patient not taking: Reported on 04/02/2022) 2 mL 0   No current facility-administered medications for this visit.     Musculoskeletal: Strength & Muscle Tone:  UTA Gait & Station:  Seated Patient leans:  NA  Psychiatric Specialty Exam: Review of Systems  Psychiatric/Behavioral:  The patient is nervous/anxious.   All other systems reviewed and are negative.   There were no vitals taken for this visit.There is no height or weight on file  to calculate BMI.  General Appearance: Casual  Eye Contact:  Fair  Speech:  Clear and Coherent  Volume:  Normal  Mood:  Anxious  Affect:  Appropriate  Thought Process:  Goal Directed and Descriptions of Associations: Intact  Orientation:  Full (Time, Place, and Person)  Thought Content: Logical   Suicidal Thoughts:  No  Homicidal Thoughts:  No  Memory:  Immediate;   Fair Recent;   Fair Remote;   Fair  Judgement:  Fair  Insight:  Fair  Psychomotor Activity:  Normal  Concentration:  Concentration: Fair and Attention Span: Fair  Recall:  [employer] of Knowledge: Fair  Language: Fair  Akathisia:  No  Handed:  Right  AIMS (if indicated): not done  Assets:  Communication Skills Desire for Improvement Housing  ADL's:  Intact  Cognition: WNL  Sleep:  Fair   Screenings: AIMS    Flowsheet Row Video Visit from 06/13/2021 in [place] Office Visit from 05/05/2021 in [place] Total Score 0 0      GAD-7    Flowsheet Row Counselor from 10/30/2021 in [place] at [place] from 05/30/2021 in [place] [facility] Video Visit from 05/26/2021 in [facility] Office Visit from 05/05/2021 in [place] Office Visit from 04/03/2021 in [place]  [facility]  Total GAD-7 Score 21 11 7 17 7       PHQ2-9    [place] Office Visit from 03/20/2022 in [facility] Office Visit from 01/11/2022 in [facility] Video Visit from 01/02/2022 in [facility] Video Visit from 12/19/2021 in [place] Office Visit from 12/15/2021 in [facility]  PHQ-2 Total Score 0 0 0 1 2  PHQ-9  Total Score 6 -- 4 -- 10      Flowsheet Row Video Visit from 04/02/2022 in [place] Video Visit from 02/01/2022 in [place] Video Visit from 12/19/2021 in [facility]  C-SSRS RISK CATEGORY Low Risk Low Risk Low Risk        Assessment and Plan: Barbara Pennington is a 42 year old Caucasian female who has a history of PTSD, borderline personality disorder, depression, GAD was evaluated by telemedicine today.  Patient is currently improving however has been noncompliant with Wellbutrin, recently initiated on modafinil per primary care.  Patient will benefit from the following plan.  Plan PTSD-improving Lexapro 20 mg p.o. daily Will reduce Wellbutrin to XL 150 mg p.o. daily with morning.  I have notified staff to contact [facility] to cancel the Wellbutrin XL 300 mg p.o. daily prescription which was sent out beginning of January.  Patient claims she never received this prescription. Continue CBT with Ms. Christina Hussami  GAD-improving BuSpar 15 mg p.o. 3 times daily Lexapro 20 mg p.o. daily Gabapentin 300 mg p.o. 3 times daily prescribed by primary provider for pain Continue DBT  Skin picking disorder-improving Hydroxyzine 25 mg twice a day as needed for severe anxiety. Continue CBT BuSpar 15 mg p.o. 3 times daily  History of borderline personality disorder-continue DBT/CBT  History of alcohol abuse-patient has been sober since 2017.  Follow-up in clinic in 2 to 3 months or sooner if needed.   Collaboration of Care: Collaboration of Care: Other encouraged to continue follow-up with therapist.  Patient/Guardian was advised Release of Information must be obtained prior to any record release in order to collaborate their care with an outside provider. Patient/Guardian was advised if they have not already done so to contact the registration department to sign  all necessary forms in order for [name] to release information regarding their care.   Consent: Patient/Guardian gives verbal consent for treatment and assignment of benefits for services provided during this visit. Patient/Guardian expressed understanding and agreed to proceed.   This note was generated in part or whole with voice recognition software. Voice recognition is usually quite accurate but there are transcription errors that can and very often do occur. I apologize for any typographical errors that were not detected and corrected.    Ursula Alert, MD 04/03/2022, 7:40 AM

## 2022-04-06 ENCOUNTER — Telehealth: Payer: Self-pay

## 2022-04-06 ENCOUNTER — Other Ambulatory Visit: Payer: Self-pay | Admitting: Psychiatry

## 2022-04-06 DIAGNOSIS — F4310 Post-traumatic stress disorder, unspecified: Secondary | ICD-10-CM

## 2022-04-06 DIAGNOSIS — F411 Generalized anxiety disorder: Secondary | ICD-10-CM

## 2022-04-06 MED ORDER — HYDROXYZINE HCL 50 MG PO TABS: 25.0000 mg | ORAL_TABLET | Freq: Two times a day (BID) | ORAL | 0 refills | Status: AC | PRN

## 2022-04-06 NOTE — Telephone Encounter (Signed)
pt was notified that rx was sent to pharmacy

## 2022-04-06 NOTE — Telephone Encounter (Signed)
Ordered

## 2022-04-06 NOTE — Telephone Encounter (Signed)
  received fax requesting a refill on thehydroxyzine hcl . does not look like that the rx was attached to a pharmacy.     Disp Refills Start End   hydrOXYzine (ATARAX) 50 MG tablet 180 tablet 0 02/01/2022    Sig - Route: Take 0.5 tablets (25 mg total) by mouth as directed. Take 25 mg twice a day and 25 mg twice a day as needed for anxiety - Oral   Class: No Print   Notes to Pharmacy: Fill when due

## 2022-04-09 ENCOUNTER — Encounter: Payer: Self-pay | Admitting: Family Medicine

## 2022-04-10 ENCOUNTER — Ambulatory Visit (INDEPENDENT_AMBULATORY_CARE_PROVIDER_SITE_OTHER): Payer: Medicaid Other | Admitting: Licensed Clinical Social Worker

## 2022-04-10 DIAGNOSIS — F4310 Post-traumatic stress disorder, unspecified: Secondary | ICD-10-CM | POA: Diagnosis not present

## 2022-04-10 DIAGNOSIS — F411 Generalized anxiety disorder: Secondary | ICD-10-CM | POA: Diagnosis not present

## 2022-04-10 NOTE — Progress Notes (Signed)
Virtual Visit via Video Note  I connected with Barbara Pennington on 04/10/22 at  4:00 PM EDT by a video enabled telemedicine application and verified that I am speaking with the correct person using two identifiers.  Location: Patient: home Provider: remote office [place], [place])   I discussed the limitations of evaluation and management by telemedicine and the availability of in person appointments. The patient expressed understanding and agreed to proceed.  I discussed the assessment and treatment plan with the patient. The patient was provided an opportunity to ask questions and all were answered. The patient agreed with the plan and demonstrated an understanding of the instructions.   The patient was advised to call back or seek an in-person evaluation if the symptoms worsen or if the condition fails to improve as anticipated.  I provided 45 minutes of non-face-to-face time during this encounter.   Tayte Mcwherter R Kenden Brandt, LCSW   THERAPIST PROGRESS NOTE  Session Time: 4-445p  Participation Level: Active  Behavioral Response: Neat and Well GroomedAlertDepressed  Type of Therapy: Individual Therapy  Treatment Goals addressed:  Develop healthy thinking patterns and beliefs about self, others, and the world that lead to the alleviation and help prevent the relapse of depression per self report 3 out of 5 sessions documented.   Learn and implement coping skills that result in a reduction of anxiety and worry, and improve daily functioning per pt report 3 out of 5 sessions documented    ProgressTowards Goals: Progressing  Interventions: CBT, Motivational Interviewing, and Supportive  Summary: Barbara Pennington is a 42 y.o. female who presents with improving symptoms related to PTSD, depression, skin picking.  Patient reports that she feels her overall mood has been stable, and that she is managing situational stressors well.  Patient rates her anxiety and stress levels of 5 on a scale of  1-10 with 10 being the highest.  Patient rates her depression symptoms as being a 3 on a scale of 1-10 with 10 being the highest.  Patient states that she is using her coping skills including grounding exercises, breathing exercises, and leaning on her partner for feelings of comfort and safety.  Patient states that one of her primary external stressors was one individual within the workplace that was a trigger for her.  Patient reports that this individual is no longer employed at her work, and patient states that the work environment has improved significantly since this person's left.  Explored how 1 person can have the power to make an entire work area toxic.  Allow patient to revisit the coping tools that she had used to manage through the difficult time.  Patient feels like she accomplished this goal well.  Patient reports that she is continuing to experience pain in her knees-patient states that she has a torn meniscus in the right knee and 1 in the left knee that is tearing.  Patient states that the doctor recommended that she lose weight, but patient does not have the motivation and has not gotten started on that goal yet.  Allow patient to explore times in her life where she has tried to lose weight, and examined her thought process about future attempts.  Patient states she would just have to think about it, she is not sure about next steps at this point in time.  Encouraged patient to think about it as a life change, not as setting 1 goal and making that the primary focus.  Discussed how making overall wellness as the focus, and  making a more lifelong change.  Patient states that she has been more active lately, and is going out more with her boyfriend.  Encouraged patient to continue with positive physical and self-care activities.  Encouraged patient to download the PTSD coach Application.  Continued recommendations are as follows: self care behaviors, positive social engagements, focusing  on overall work/home/life balance, and focusing on positive physical and emotional wellness.   Suicidal/Homicidal: No  Therapist Response: Pt is continuing to apply interventions learned in session into daily life situations. Pt is currently on track to meet goals utilizing interventions mentioned above. Personal growth and progress noted. Treatment to continue as indicated.   Plan: Return again in 4 weeks.  Diagnosis:  Encounter Diagnoses  Name Primary?   PTSD (post-traumatic stress disorder) Yes   GAD (generalized anxiety disorder)      Collaboration of Care: Patient refused AEB pt is scheduled to start psychiatric treatment with Dr. Ursula Alert . Pt had psychiatric services w/ [facility] in past.   Patient/Guardian was advised Release of Information must be obtained prior to any record release in order to collaborate their care with an outside provider. Patient/Guardian was advised if they have not already done so to contact the registration department to sign all necessary forms in order for [name] to release information regarding their care.   Consent: Patient/Guardian gives verbal consent for treatment and assignment of benefits for services provided during this visit. Patient/Guardian expressed understanding and agreed to proceed.   [place], LCSW 04/10/2022

## 2022-04-11 ENCOUNTER — Other Ambulatory Visit: Payer: Self-pay | Admitting: Family Medicine

## 2022-04-11 DIAGNOSIS — I10 Essential (primary) hypertension: Secondary | ICD-10-CM

## 2022-04-11 DIAGNOSIS — N1831 Chronic kidney disease, stage 3a: Secondary | ICD-10-CM

## 2022-04-23 NOTE — Progress Notes (Unsigned)
Name: Barbara Pennington   MRN: [id number]    DOB: 02-07-1980   Date:04/24/2022       Progress Note  Subjective  Chief Complaint  Follow Up  HPI  Morbid Obesity: her weight was 180 lbs when she graduated HS, when she graduated college/after birth of her first child her weight was up 256 lbs (January 2004 after his birth), she lost weight between 2009-2010 due to severe depression down to around 180 lbs, she gradually started to gain weight again and stay around 220 lbs around 216 lbs, but had plantar fascitis and had surgery so she gained weight after that. In 2019 she injury her back at work and lack of activity also caused more weight gain.Her heaviest weight is was lat month at 257 lbs today is down to 253 lbs, she states eating more fruit and vegetables    She has  tried Rybelsus samples 3 mg and was able to lose 7 lbs, GLP-1 agonist would be very beneficial for her, metformin caused diarrhea .  She is morbidly obese with OSA, OA, GERD, CKI, glucose intolerance and dyslipidemia  She has tried [employer] and [employer] ,Doylene Bode   Metabolic syndrome with obesity: responded well to GLP-1 in the past for pre-diabetes. We gave her Reginal Lutes last visit but [place] wide shortage, she only took initial dose, she still has 0.25 dose at [facility] and will contact them today to see if able to fill it   HTN/ Chronic kidney disease:She states she is following up with Nephrology for monitoring. She is compliant with medication, taking norvasc 2.5 mg and valsartan hctz 320/12.5 mg , we will continue current dose but if bp spikes with modafinil we will adjust dose of norvasc to 5 mg , she did not take mondafinil this am, advised to come in for bp check in 2 weeks since we will adjust dose of modafinil today   History of right knee surgery: done 07/17, had right meniscal repair and patella chondroplasty. She is able to walk now without problems , still under PT   Chronic back pain: she states standing causes  increase of pain, even sitting has to shift positions frequently. She had a stand up desk, but was given to another employee. Aching pain, worse on right lower back, no radiculitis. She states she is doing better since she started to see a chiropractor Unchanged   CKI : stage III, advised to stay off nsaid's. She has good urine output , no pruritus   Stress incontinence: going on for about one year, happens when she coughs or sneezes or even when laughing , we placed referral to PT but she did not get a call. She is not ready to go now   Depression/GAD/personality disorder: under the care of Dr. Elna Breslow, who she sees about every 3 months, she also has a therapist who she sees at least once a month, she is  compliant with medications and states feeling well this week, phq 9 today is negative . Continue current regiment. FMLA forms filled to give her time to go to appointment visit   Asthma mild intermittent: she has noticed increase in cough and sob since neighbors smoke cigarettes and pot. We changed to symbicort she states she prefers using albuterol but currently only using it prn. Discussed Airsuppra with patient today but she prefers staying on albuterol for now   OSA: she has been using CPAP she states wears it every night but still has severe daytime somnolence  and sometimes dozes off while driving. 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 06/09/2021   GAD (generalized anxiety disorder) 05/05/2021   Depressive disorder 05/05/2021   History of borderline personality disorder 05/05/2021   History of alcohol abuse 05/05/2021    Internal derangement of left knee 04/03/2021   Primary osteoarthritis of left knee 04/03/2021   Metabolic syndrome 03/24/2021   Impaired fasting glucose 12/06/2020   Lymphedema 12/06/2020   Moderate episode of recurrent major depressive disorder 02/08/2020   CKD (chronic kidney disease) stage 3, GFR 30-59 ml/min 06/11/2019   PTSD (post-traumatic stress disorder) 01/14/2019   Borderline personality disorder 01/14/2019   Anxiety 01/14/2019   Mixed hyperlipidemia 01/14/2019   Morbid obesity with BMI of 50.0-59.9, adult 01/14/2019   OSA on CPAP 01/14/2019   Asthma, mild persistent 04/30/2018   Low back pain 08/20/2017   Fibroadenoma of breast, 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   Essential hypertension     Past Surgical History:  Procedure Laterality Date   ABDOMINAL HYSTERECTOMY  01/08/2006   BREAST BIOPSY Left 01/09/2011   CORE - FIBROADENOMA VS PHYLLODES   CESAREAN SECTION  01/08/2005   COLONOSCOPY WITH PROPOFOL N/A 10/09/2016   Procedure: COLONOSCOPY WITH PROPOFOL;  Surgeon: Wyline Mood, MD;  Location: [facility] ENDOSCOPY;  Service: Gastroenterology;  Laterality: N/A;   COLONOSCOPY WITH PROPOFOL N/A 11/09/2019   Procedure: COLONOSCOPY WITH PROPOFOL;  Surgeon: Toney Reil, MD;  Location: [facility] ENDOSCOPY;  Service: Gastroenterology;  Laterality: N/A;   ESOPHAGOGASTRODUODENOSCOPY     GUM SURGERY  01/08/2002   KNEE ARTHROSCOPY WITH MEDIAL MENISECTOMY Right 07/24/2021   Procedure: Right medial meniscus root repair and patella chondroplasty;  Surgeon: Signa Kell, MD;  Location: [facility] ORS;  Service: Orthopedics;  Laterality: Right;   PLANTAR FASCIA RELEASE Left 08/19/2014   Procedure: ENDOSCOPIC PLANTAR FASCIOTOMY RELEASE L WITH TOPAZ;  Surgeon: Recardo Evangelist, DPM;  Location[facility];  Service: Podiatry;  Laterality: Left;  LMA WITH POPLITEAL BLOCK   TONSILLECTOMY  01/08/1982    Family History  Problem Relation Age of Onset   Obesity Mother    Diabetes Mother    High Cholesterol  Mother    Hypertension Mother    Neuropathy Mother    Depression Mother    Anxiety disorder Mother    Obesity Father    Hypertension Father    High Cholesterol Father    Depression Father    Anxiety disorder Father    Seizures Brother    Testicular cancer Brother    Breast cancer Maternal Grandmother    Hypertension Maternal Grandmother    Aneurysm Maternal Grandmother    Stroke Maternal Grandmother    Hypertension Maternal Grandfather    High Cholesterol Maternal Grandfather    Heart disease Maternal Grandfather        has a pacemaker   Stroke Maternal Grandfather    Alzheimer's disease Paternal Grandmother    Dementia Paternal Grandmother    Colon cancer Paternal Grandfather    Mental illness Maternal Aunt    Suicidality Cousin     Social History   Tobacco Use   Smoking status: Former  Packs/day: 0.25    Years: 10.00    Additional pack years: 0.00    Total pack years: 2.50    Types: Cigarettes    Quit date: 07/05/2014    Years since quitting: 7.8   Smokeless tobacco: Never  Substance Use Topics   Alcohol use: Yes    Comment: rare     Current Outpatient Medications:    acetaminophen (TYLENOL) 500 MG tablet, Take 2 tablets (1,000 mg total) by mouth every 8 (eight) hours., Disp: 90 tablet, Rfl: 2   albuterol (VENTOLIN HFA) 108 (90 Base) MCG/ACT inhaler, Inhale 2 puffs into the lungs every 4 (four) hours as needed for wheezing or shortness of breath., Disp: 9 g, Rfl: 0   amLODipine (NORVASC) 2.5 MG tablet, TAKE 1 TABLET BY MOUTH IN THE MORNING, Disp: 30 tablet, Rfl: 0   atorvastatin (LIPITOR) 20 MG tablet, Take 1 tablet (20 mg total) by mouth every morning., Disp: 90 tablet, Rfl: 1   buPROPion (WELLBUTRIN XL) 150 MG 24 hr tablet, Take 1 tablet (150 mg total) by mouth daily with breakfast. Stop wellbutrin xl 300 mg, Disp: 90 tablet, Rfl: 0   busPIRone (BUSPAR) 15 MG tablet, Take 1 tablet (15 mg total) by mouth 3 (three) times daily., Disp: 270 tablet, Rfl: 1    desloratadine (CLARINEX) 5 MG tablet, Take 1 tablet (5 mg total) by mouth daily., Disp: 30 tablet, Rfl: 11   escitalopram (LEXAPRO) 20 MG tablet, Take 1 tablet (20 mg total) by mouth daily., Disp: 90 tablet, Rfl: 1   fluticasone (FLONASE) 50 MCG/ACT nasal spray, Place 1 spray into both nostrils daily., Disp: 18.2 mL, Rfl: 6   gabapentin (NEURONTIN) 300 MG capsule, Take 1 capsule by mouth 3 (three) times daily., Disp: , Rfl:    hydrOXYzine (ATARAX) 50 MG tablet, Take 0.5 tablets (25 mg total) by mouth 2 (two) times daily as needed (severe anxiety)., Disp: 90 tablet, Rfl: 0   ipratropium-albuterol (DUONEB) 0.5-2.5 (3) MG/3ML SOLN, Take 3 mLs by nebulization 3 (three) times daily as needed (asthma/bronchitis sx, cough wheeze sob)., Disp: 180 mL, Rfl: 1   Melatonin 10 MG CAPS, Take 10-20 mg by mouth at bedtime., Disp: , Rfl:    metaxalone (SKELAXIN) 800 MG tablet, Take 1 tablet (800 mg total) by mouth 3 (three) times daily as needed for muscle spasms., Disp: 270 tablet, Rfl: 0   modafinil (PROVIGIL) 100 MG tablet, Take 1 tablet (100 mg total) by mouth in the morning., Disp: 30 tablet, Rfl: 0   montelukast (SINGULAIR) 10 MG tablet, Take 1 tablet (10 mg total) by mouth at bedtime., Disp: 90 tablet, Rfl: 1   Semaglutide-Weight Management (WEGOVY) 0.25 MG/0.5ML SOAJ, Inject 0.25 mg into the skin once a week., Disp: 2 mL, Rfl: 0   Semaglutide-Weight Management (WEGOVY) 0.5 MG/0.5ML SOAJ, Inject 0.5 mg into the skin once a week., Disp: 2 mL, Rfl: 0   Semaglutide-Weight Management (WEGOVY) 1 MG/0.5ML SOAJ, Inject 1 mg into the skin once a week., Disp: 2 mL, Rfl: 0   valsartan-hydrochlorothiazide (DIOVAN-HCT) 320-12.5 MG tablet, Take 1 tablet by mouth daily., Disp: 90 tablet, Rfl: 0   budesonide-formoterol (SYMBICORT) 160-4.5 MCG/ACT inhaler, Inhale 2 puffs into the lungs 2 (two) times daily. (Patient not taking: Reported on 04/24/2022), Disp: 3 each, Rfl: 0  Allergies  Allergen Reactions   Latex Rash    Metformin And Related     diarrhea    I personally reviewed active problem list, medication list, allergies, family history, social history, health  maintenance with the patient/caregiver today.   ROS  Constitutional: Negative for fever or weight change.  Respiratory: Negative for cough and shortness of breath.   Cardiovascular: Negative for chest pain or palpitations.  Gastrointestinal: Negative for abdominal pain, no bowel changes.  Musculoskeletal: Negative for gait problem or joint swelling.  Skin: Negative for rash.  Neurological: Negative for dizziness or headache.  No other specific complaints in a complete review of systems (except as listed in HPI above).   Objective  Vitals:   04/24/22 0927  BP: 130/82  Pulse: 93  Resp: 16  SpO2: 96%  Weight: (!) 353 lb (160.1 kg)  Height: 5\' 4"  (1.626 m)    Body mass index is 60.59 kg/m.  Physical Exam  Constitutional: Patient appears well-developed and well-nourished. Obese  No distress.  HEENT: head atraumatic, normocephalic, pupils equal and reactive to light, neck supple Cardiovascular: Normal rate, regular rhythm and normal heart sounds.  No murmur heard. Trace  BLE edema. Pulmonary/Chest: Effort normal and breath sounds normal. No respiratory distress. Abdominal: Soft.  There is no tenderness. Psychiatric: Patient has a normal mood and affect. behavior is normal. Judgment and thought content normal.   Recent Results (from the past 2160 hour(s))  Pulmonary Function Test [facility] Only     Status: None   Collection Time: 02/13/22  3:39 PM  Result Value Ref Range   FVC-Pre 3.08 L   FVC-%Pred-Pre 82 %   FVC-Post 3.25 L   FVC-%Pred-Post 86 %   FVC-%Change-Post 5 %   FEV1-Pre 2.54 L   FEV1-%Pred-Pre 84 %   FEV1-Post 2.69 L   FEV1-%Pred-Post 88 %   FEV1-%Change-Post 5 %   FEV6-Pre 3.08 L   FEV6-%Pred-Pre 84 %   FEV6-Post 3.23 L   FEV6-%Pred-Post 88 %   FEV6-%Change-Post 4 %   Pre FEV1/FVC ratio 83 %    FEV1FVC-%Pred-Pre 100 %   Post FEV1/FVC ratio 83 %   FEV1FVC-%Change-Post 0 %   Pre FEV6/FVC Ratio 100 %   FEV6FVC-%Pred-Pre 102 %   Post FEV6/FVC ratio 100 %   FEV6FVC-%Pred-Post 102 %   FEF 25-75 Pre 2.58 L/sec   FEF2575-%Pred-Pre 83 %   FEF 25-75 Post 3.21 L/sec   FEF2575-%Pred-Post 104 %   FEF2575-%Change-Post 24 %   RV 2.47 L   RV % pred 149 %   TLC 6.07 L   TLC % pred 118 %   DLCO unc 23.17 ml/min/mmHg   DLCO unc % pred 104 %   DL/VA 3.24 ml/min/mmHg/L   DL/VA % pred 401 %  ECHOCARDIOGRAM COMPLETE     Status: None   Collection Time: 02/19/22  9:52 AM  Result Value Ref Range   Ao pk vel 1.60 m/s   AV Area VTI 2.22 cm2   AR max vel 2.09 cm2   AV Mean grad 5.5 mmHg   AV Peak grad 10.2 mmHg   S' Lateral 3.30 cm   AV Area mean vel 2.05 cm2   Area-P 1/2 3.24 cm2   Est EF 55 - 60%     PHQ2/9:    04/24/2022    9:45 AM 03/20/2022    7:43 AM 01/11/2022    8:03 AM 01/02/2022   10:21 AM 12/19/2021   10:15 AM  Depression screen PHQ 2/9  Decreased Interest 1 0 0 0   Down, Depressed, Hopeless 0 0 0 0   PHQ - 2 Score 1 0 0 0   Altered sleeping 3 3  1    Tired,  decreased energy 3 3  2    Change in appetite 0 0  0   Feeling bad or failure about yourself  0 0  0   Trouble concentrating 0 0  1   Moving slowly or fidgety/restless 0 0  0   Suicidal thoughts 0 0  0   PHQ-9 Score 7 6  4       Information is confidential and restricted. Go to Review Flowsheets to unlock data.    phq 9 is positive   Fall Risk:    04/24/2022    9:26 AM 03/20/2022    7:42 AM 01/11/2022    8:02 AM 01/02/2022   10:21 AM 12/15/2021    7:42 AM  Fall Risk   Falls in the past year? 1 1 1 1 1   Number falls in past yr: 1 1 1 1 1   Injury with Fall? 1 1 1  0 0  Risk for fall due to : No Fall Risks No Fall Risks History of fall(s) No Fall Risks No Fall Risks  Follow up Falls prevention discussed Falls prevention discussed Falls evaluation completed Falls prevention discussed Falls prevention discussed       Functional Status Survey: Is the patient deaf or have difficulty hearing?: No Does the patient have difficulty seeing, even when wearing glasses/contacts?: No Does the patient have difficulty concentrating, remembering, or making decisions?: Yes Does the patient have difficulty walking or climbing stairs?: Yes Does the patient have difficulty dressing or bathing?: No Does the patient have difficulty doing errands alone such as visiting a doctor's office or shopping?: No    Assessment & Plan  1. Morbid obesity with BMI of 50.0-59.9, adult  She will try to fill rx of Wegovy   2. Essential hypertension  - valsartan-hydrochlorothiazide (DIOVAN-HCT) 320-12.5 MG tablet; Take 1 tablet by mouth daily.  Dispense: 90 tablet; Refill: 0 - amLODipine (NORVASC) 2.5 MG tablet; Take 1 tablet (2.5 mg total) by mouth every morning.  Dispense: 90 tablet; Refill: 0  3. Stage 3a chronic kidney disease  - valsartan-hydrochlorothiazide (DIOVAN-HCT) 320-12.5 MG tablet; Take 1 tablet by mouth daily.  Dispense: 90 tablet; Refill: 0 - amLODipine (NORVASC) 2.5 MG tablet; Take 1 tablet (2.5 mg total) by mouth every morning.  Dispense: 90 tablet; Refill: 0  4. OSA on CPAP  - modafinil (PROVIGIL) 200 MG tablet; Take 1 tablet (200 mg total) by mouth in the morning.  Dispense: 30 tablet; Refill: 0  5. Daytime somnolence  We will adjust dose of provigil today and return in two weeks for bp check  - modafinil (PROVIGIL) 200 MG tablet; Take 1 tablet (200 mg total) by mouth in the morning.  Dispense: 30 tablet; Refill: 0    6. Mild persistent asthma without complication  Stable   7. Primary osteoarthritis of left knee  Stable  8. GAD (generalized anxiety disorder)  Keep follow up with psychiatrist   9. Metabolic syndrome  On life style modification  10. Borderline personality disorder   Keep visit with psychiatrist and therapist

## 2022-04-24 ENCOUNTER — Ambulatory Visit (INDEPENDENT_AMBULATORY_CARE_PROVIDER_SITE_OTHER): Payer: PRIVATE HEALTH INSURANCE | Admitting: Family Medicine

## 2022-04-24 ENCOUNTER — Encounter: Payer: Self-pay | Admitting: Family Medicine

## 2022-04-24 VITALS — BP 130/82 | HR 93 | Resp 16 | Ht 64.0 in | Wt 353.0 lb

## 2022-04-24 DIAGNOSIS — I10 Essential (primary) hypertension: Secondary | ICD-10-CM | POA: Diagnosis not present

## 2022-04-24 DIAGNOSIS — E8881 Metabolic syndrome: Secondary | ICD-10-CM

## 2022-04-24 DIAGNOSIS — N1831 Chronic kidney disease, stage 3a: Secondary | ICD-10-CM | POA: Diagnosis not present

## 2022-04-24 DIAGNOSIS — J4530 Mild persistent asthma, uncomplicated: Secondary | ICD-10-CM

## 2022-04-24 DIAGNOSIS — R400 Somnolence: Secondary | ICD-10-CM

## 2022-04-24 DIAGNOSIS — G4733 Obstructive sleep apnea (adult) (pediatric): Secondary | ICD-10-CM

## 2022-04-24 DIAGNOSIS — F411 Generalized anxiety disorder: Secondary | ICD-10-CM

## 2022-04-24 DIAGNOSIS — Z6841 Body Mass Index (BMI) 40.0 and over, adult: Secondary | ICD-10-CM

## 2022-04-24 DIAGNOSIS — M1712 Unilateral primary osteoarthritis, left knee: Secondary | ICD-10-CM

## 2022-04-24 DIAGNOSIS — F603 Borderline personality disorder: Secondary | ICD-10-CM

## 2022-04-24 MED ORDER — VALSARTAN-HYDROCHLOROTHIAZIDE 320-12.5 MG PO TABS: 1.0000 | ORAL_TABLET | Freq: Every day | ORAL | 0 refills | Status: DC

## 2022-04-24 MED ORDER — MODAFINIL 200 MG PO TABS: 200.0000 mg | ORAL_TABLET | Freq: Every morning | ORAL | 0 refills | Status: DC

## 2022-04-24 MED ORDER — AMLODIPINE BESYLATE 2.5 MG PO TABS: 2.5000 mg | ORAL_TABLET | Freq: Every morning | ORAL | 0 refills | Status: DC

## 2022-05-01 ENCOUNTER — Encounter: Payer: Self-pay | Admitting: Family Medicine

## 2022-05-01 ENCOUNTER — Other Ambulatory Visit: Payer: Self-pay | Admitting: Family Medicine

## 2022-05-01 MED ORDER — WEGOVY 0.5 MG/0.5ML ~~LOC~~ SOAJ: 0.5000 mg | SUBCUTANEOUS | 0 refills | Status: DC

## 2022-05-08 ENCOUNTER — Ambulatory Visit: Payer: Medicaid Other

## 2022-05-08 VITALS — BP 132/78 | Wt 346.0 lb

## 2022-05-08 DIAGNOSIS — Z0130 Encounter for examination of blood pressure without abnormal findings: Secondary | ICD-10-CM

## 2022-05-13 ENCOUNTER — Other Ambulatory Visit: Payer: Self-pay | Admitting: Family Medicine

## 2022-05-13 ENCOUNTER — Encounter: Payer: Self-pay | Admitting: Family Medicine

## 2022-05-14 ENCOUNTER — Telehealth (INDEPENDENT_AMBULATORY_CARE_PROVIDER_SITE_OTHER): Payer: PRIVATE HEALTH INSURANCE | Admitting: Psychiatry

## 2022-05-14 ENCOUNTER — Encounter: Payer: Self-pay | Admitting: Psychiatry

## 2022-05-14 DIAGNOSIS — F411 Generalized anxiety disorder: Secondary | ICD-10-CM | POA: Diagnosis not present

## 2022-05-14 DIAGNOSIS — Z8659 Personal history of other mental and behavioral disorders: Secondary | ICD-10-CM

## 2022-05-14 DIAGNOSIS — F424 Excoriation (skin-picking) disorder: Secondary | ICD-10-CM | POA: Diagnosis not present

## 2022-05-14 DIAGNOSIS — F1011 Alcohol abuse, in remission: Secondary | ICD-10-CM

## 2022-05-14 DIAGNOSIS — F4310 Post-traumatic stress disorder, unspecified: Secondary | ICD-10-CM

## 2022-05-14 MED ORDER — WEGOVY 1 MG/0.5ML ~~LOC~~ SOAJ: 1.0000 mg | SUBCUTANEOUS | 0 refills | Status: DC

## 2022-05-14 MED ORDER — BUSPIRONE HCL 30 MG PO TABS: 30.0000 mg | ORAL_TABLET | Freq: Two times a day (BID) | ORAL | 0 refills | Status: DC

## 2022-05-14 NOTE — Progress Notes (Signed)
Virtual Visit via Video Note  I connected with Barbara Pennington on 05/14/22 at  1:20 PM EDT by a video enabled telemedicine application and verified that I am speaking with the correct person using two identifiers.  Location Provider Location : [facility] Patient Location : Work  Participants: Patient , Provider   I discussed the limitations of evaluation and management by telemedicine and the availability of in person appointments. The patient expressed understanding and agreed to proceed.   I discussed the assessment and treatment plan with the patient. The patient was provided an opportunity to ask questions and all were answered. The patient agreed with the plan and demonstrated an understanding of the instructions.   The patient was advised to call back or seek an in-person evaluation if the symptoms worsen or if the condition fails to improve as anticipated.    BH MD OP Progress Note  05/15/2022 1:57 PM Barbara Pennington  MRN:  [id number]  Chief Complaint:  Chief Complaint  Patient presents with   Follow-up   Anxiety   Depression   Medication Refill   HPI: Barbara Pennington is a 42 year old Caucasian female, divorced, employed, lives in [place], has a history of PTSD, GAD, history of borderline personality disorder, morbid obesity, OSA on CPAP, hypertension/chronic kidney disease, asthma, chronic back pain, status post right sided knee surgery was evaluated by telemedicine today.  Patient today reports she has had recent situational stresses which resulted in worsening anxiety past few weeks.  She reports several colleagues lost their jobs and that was anxiety provoking for her.  She also reports she was started on Wegovy 4 weeks ago.  She has lost around 15 pounds since then.  She reports she has had GI problems from the [facility] like constipation, nausea.  Usually happens around the time that she receives her injections.  She reports anxiety triggered some crying spells.  She had a crying  spell at her therapist visit recently.  She reports she also has noticed some worsening skin picking especially on her lower extremities.  Usually happens at bedtime.  Patient denies any significant sadness, lack of motivation or hopelessness.  She reports she is currently compliant on the Wellbutrin, Lexapro.  Denies side effects.  Patient reports sleep is overall okay.  Denies any suicidality, homicidality or perceptual disturbances.  Agreeable to increasing the dosage of BuSpar.  Currently tolerating it well.  Patient denies any other concerns today.  Visit Diagnosis:    ICD-10-CM   1. PTSD (post-traumatic stress disorder)  F43.10 busPIRone (BUSPAR) 30 MG tablet    2. GAD (generalized anxiety disorder)  F41.1 busPIRone (BUSPAR) 30 MG tablet    3. Skin-picking disorder  F42.4 busPIRone (BUSPAR) 30 MG tablet    4. History of borderline personality disorder  Z86.59     5. History of alcohol abuse  F10.11       Past Psychiatric History: I have reviewed past psychiatric history from progress note on 05/05/2021.  Patient was previously under the care of [facility].  Past Medical History:  Past Medical History:  Diagnosis Date   Anxiety    Arthritis 2006   rhuematoid - mild - no current issues   Asthma 2003   Borderline personality disorder (HCC)    Chronic kidney disease    stage 3   Complication of anesthesia    "Usually" has low temp after surgery.After hysterectomy temp was 94   Depression    GERD (gastroesophageal reflux disease)    History of  kidney stones    Hyperlipidemia    Hypertension 2013   Lump or mass in breast 2013   RIGHT BREAST   Motion sickness    repeated amusement park rides   PTSD (post-traumatic stress disorder)    Scoliosis    no current issues   Shortness of breath dyspnea    secondary to weight    Sleep apnea    uses cpap   Ulcer 2001    Past Surgical History:  Procedure Laterality Date   ABDOMINAL HYSTERECTOMY  01/08/2006   BREAST BIOPSY  Left 01/09/2011   CORE - FIBROADENOMA VS PHYLLODES   CESAREAN SECTION  01/08/2005   COLONOSCOPY WITH PROPOFOL N/A 10/09/2016   Procedure: COLONOSCOPY WITH PROPOFOL;  Surgeon: Wyline Mood, MD;  Location: [facility] ENDOSCOPY;  Service: Gastroenterology;  Laterality: N/A;   COLONOSCOPY WITH PROPOFOL N/A 11/09/2019   Procedure: COLONOSCOPY WITH PROPOFOL;  Surgeon: Toney Reil, MD;  Location: [facility] ENDOSCOPY;  Service: Gastroenterology;  Laterality: N/A;   ESOPHAGOGASTRODUODENOSCOPY     GUM SURGERY  01/08/2002   KNEE ARTHROSCOPY WITH MEDIAL MENISECTOMY Right 07/24/2021   Procedure: Right medial meniscus root repair and patella chondroplasty;  Surgeon: Signa Kell, MD;  Location: [facility] ORS;  Service: Orthopedics;  Laterality: Right;   PLANTAR FASCIA RELEASE Left 08/19/2014   Procedure: ENDOSCOPIC PLANTAR FASCIOTOMY RELEASE L WITH TOPAZ;  Surgeon: Recardo Evangelist, DPM;  Location: [facility];  Service: Podiatry;  Laterality: Left;  LMA WITH POPLITEAL BLOCK   TONSILLECTOMY  01/08/1982    Family Psychiatric History: I have reviewed family psychiatric history from progress note on 05/05/2021.  Family History:  Family History  Problem Relation Age of Onset   Obesity Mother    Diabetes Mother    High Cholesterol Mother    Hypertension Mother    Neuropathy Mother    Depression Mother    Anxiety disorder Mother    Obesity Father    Hypertension Father    High Cholesterol Father    Depression Father    Anxiety disorder Father    Seizures Brother    Testicular cancer Brother    Breast cancer Maternal Grandmother    Hypertension Maternal Grandmother    Aneurysm Maternal Grandmother    Stroke Maternal Grandmother    Hypertension Maternal Grandfather    High Cholesterol Maternal Grandfather    Heart disease Maternal Grandfather        has a pacemaker   Stroke Maternal Grandfather    Alzheimer's disease Paternal Grandmother    Dementia Paternal Grandmother    Colon cancer Paternal  Grandfather    Mental illness Maternal Aunt    Suicidality Cousin     Social History: Reviewed social history from progress note on 05/05/2021. Social History   Socioeconomic History   Marital status: Divorced    Spouse name: Not on file   Number of children: 2   Years of education: 14   Highest education level: Bachelor's degree (e.g., BA, AB, BS)  Occupational History   Not on file  Tobacco Use   Smoking status: Former    Packs/day: 0.25    Years: 10.00    Additional pack years: 0.00    Total pack years: 2.50    Types: Cigarettes    Quit date: 07/05/2014    Years since quitting: 7.8   Smokeless tobacco: Never  Vaping Use   Vaping Use: Never used  Substance and Sexual Activity   Alcohol use: Yes    Comment: rare   Drug  use: No   Sexual activity: Yes    Partners: Male    Birth control/protection: Surgical    Comment: 2007  Other Topics Concern   Not on file  Social History Narrative   Not on file   Social Determinants of Health   Financial Resource Strain: High Risk (04/23/2022)   Overall Financial Resource Strain (CARDIA)    Difficulty of Paying Living Expenses: Hard  Food Insecurity: Food Insecurity Present (04/23/2022)   Hunger Vital Sign    Worried About Running Out of Food in the Last Year: Often true    Ran Out of Food in the Last Year: Sometimes true  Transportation Needs: No Transportation Needs (04/23/2022)   PRAPARE - Administrator, Civil Service (Medical): No    Lack of Transportation (Non-Medical): No  Physical Activity: Unknown (04/23/2022)   Exercise Vital Sign    Days of Exercise per Week: 0 days    Minutes of Exercise per Session: Not on file  Stress: Stress Concern Present (04/23/2022)   [employer] of Occupational Health - Occupational Stress Questionnaire    Feeling of Stress : Rather much  Social Connections: Socially Isolated (04/23/2022)   Social Connection and Isolation Panel [NHANES]    Frequency of Communication with  Friends and Family: Twice a week    Frequency of Social Gatherings with Friends and Family: Never    Attends Religious Services: Never    Database administrator or Organizations: No    Attends Engineer, structural: Not on file    Marital Status: Divorced    Allergies:  Allergies  Allergen Reactions   Latex Rash   Metformin And Related     diarrhea    Metabolic Disorder Labs: Lab Results  Component Value Date   HGBA1C 5.5 12/15/2021   MPG 111 12/15/2021   MPG 105 12/06/2020   No results found for: "PROLACTIN" Lab Results  Component Value Date   CHOL 186 12/15/2021   TRIG 73 12/15/2021   HDL 60 12/15/2021   CHOLHDL 3.1 12/15/2021   VLDL 27 08/06/2019   LDLCALC 110 (H) 12/15/2021   LDLCALC 111 (H) 12/06/2020   Lab Results  Component Value Date   TSH 1.83 12/15/2021   TSH 2.59 04/04/2018    Therapeutic Level Labs: No results found for: "LITHIUM" No results found for: "VALPROATE" No results found for: "CBMZ"  Current Medications: Current Outpatient Medications  Medication Sig Dispense Refill   busPIRone (BUSPAR) 30 MG tablet Take 1 tablet (30 mg total) by mouth 2 (two) times daily. 180 tablet 0   acetaminophen (TYLENOL) 500 MG tablet Take 2 tablets (1,000 mg total) by mouth every 8 (eight) hours. 90 tablet 2   albuterol (VENTOLIN HFA) 108 (90 Base) MCG/ACT inhaler Inhale 2 puffs into the lungs every 4 (four) hours as needed for wheezing or shortness of breath. 9 g 0   amLODipine (NORVASC) 2.5 MG tablet Take 1 tablet (2.5 mg total) by mouth every morning. 90 tablet 0   atorvastatin (LIPITOR) 20 MG tablet Take 1 tablet (20 mg total) by mouth every morning. 90 tablet 1   buPROPion (WELLBUTRIN XL) 150 MG 24 hr tablet Take 1 tablet (150 mg total) by mouth daily with breakfast. Stop wellbutrin xl 300 mg 90 tablet 0   desloratadine (CLARINEX) 5 MG tablet Take 1 tablet (5 mg total) by mouth daily. 30 tablet 11   escitalopram (LEXAPRO) 20 MG tablet Take 1 tablet (20  mg total) by mouth daily.  90 tablet 1   fluticasone (FLONASE) 50 MCG/ACT nasal spray Place 1 spray into both nostrils daily. 18.2 mL 6   gabapentin (NEURONTIN) 300 MG capsule Take 1 capsule by mouth 3 (three) times daily.     hydrOXYzine (ATARAX) 50 MG tablet Take 0.5 tablets (25 mg total) by mouth 2 (two) times daily as needed (severe anxiety). 90 tablet 0   ipratropium-albuterol (DUONEB) 0.5-2.5 (3) MG/3ML SOLN Take 3 mLs by nebulization 3 (three) times daily as needed (asthma/bronchitis sx, cough wheeze sob). 180 mL 1   Melatonin 10 MG CAPS Take 10-20 mg by mouth at bedtime.     metaxalone (SKELAXIN) 800 MG tablet Take 1 tablet (800 mg total) by mouth 3 (three) times daily as needed for muscle spasms. 270 tablet 0   modafinil (PROVIGIL) 200 MG tablet Take 1 tablet (200 mg total) by mouth in the morning. 30 tablet 0   montelukast (SINGULAIR) 10 MG tablet Take 1 tablet (10 mg total) by mouth at bedtime. 90 tablet 1   Semaglutide-Weight Management (WEGOVY) 0.25 MG/0.5ML SOAJ Inject 0.25 mg into the skin once a week. 2 mL 0   Semaglutide-Weight Management (WEGOVY) 0.5 MG/0.5ML SOAJ Inject 0.5 mg into the skin once a week. 2 mL 0   Semaglutide-Weight Management (WEGOVY) 1 MG/0.5ML SOAJ Inject 1 mg into the skin once a week. 2 mL 0   valsartan-hydrochlorothiazide (DIOVAN-HCT) 320-12.5 MG tablet Take 1 tablet by mouth daily. 90 tablet 0   No current facility-administered medications for this visit.     Musculoskeletal: Strength & Muscle Tone:  UTA Gait & Station:  Seated Patient leans: N/A  Psychiatric Specialty Exam: Review of Systems  Gastrointestinal:  Positive for constipation and nausea.  Psychiatric/Behavioral:  The patient is nervous/anxious.        Skin picking   All other systems reviewed and are negative.   There were no vitals taken for this visit.There is no height or weight on file to calculate BMI.  General Appearance: Casual  Eye Contact:  Fair  Speech:  Clear and Coherent   Volume:  Normal  Mood:  Anxious  Affect:  Congruent  Thought Process:  Goal Directed and Descriptions of Associations: Intact  Orientation:  Full (Time, Place, and Person)  Thought Content: Logical   Suicidal Thoughts:  No  Homicidal Thoughts:  No  Memory:  Immediate;   Fair Recent;   Fair Remote;   Fair  Judgement:  Fair  Insight:  Fair  Psychomotor Activity:  Normal  Concentration:  Concentration: Fair and Attention Span: Fair  Recall:  [employer] of Knowledge: Fair  Language: Fair  Akathisia:  No  Handed:  Right  AIMS (if indicated): not done  Assets:  Communication Skills Desire for Improvement Housing Social Support  ADL's:  Intact  Cognition: WNL  Sleep:  Fair   Screenings: AIMS    Flowsheet Row Video Visit from 06/13/2021 in [facility] Office Visit from 05/05/2021 in [facility]  AIMS Total Score 0 0      GAD-7    Flowsheet Row Counselor from 10/30/2021 in [facility] at [facility] Visit from 05/30/2021 in [facility] Video Visit from 05/26/2021 in [facility] Office Visit from 05/05/2021 in [facility] Office Visit from 04/03/2021 in [facility] at [facility]  Total GAD-7 Score  21 11 7 17 7       PHQ2-9    Flowsheet Row Office Visit from 04/24/2022 in [facility] Office Visit from 03/20/2022 in [facility] Office Visit from 01/11/2022 in [facility] Video Visit from 01/02/2022 in [facility] Video Visit from 12/19/2021 in [facility]  PHQ-2 Total Score 1 0 0 0 1  PHQ-9 Total Score 7 6 -- 4 --      Flowsheet Row Video Visit from 05/14/2022 in [facility] Counselor from 04/10/2022 in [facility] at [facility] Video Visit from 04/02/2022 in [facility]  C-SSRS RISK CATEGORY Low Risk Error: Q3, 4, or 5 should not be populated when Q2 is No Low Risk        Assessment and Plan: Barbara Pennington is a 42 year old Caucasian female who has a history of PTSD, borderline personality disorder, depression, was evaluated by telemedicine today.  Patient is currently improving.  Plan as noted below.  Plan PTSD-improving Lexapro 20 mg p.o. daily Wellbutrin XL 150 mg p.o. daily in the morning-reduced dosage. Continue CBT with Ms. Christina Hussami  GAD-unstable Increase BuSpar to 30 mg p.o. twice daily Lexapro 20 mg p.o. daily Gabapentin 300 mg p.o. 3 times daily prescribed by primary care provider for pain which also helps with anxiety Continue DBT.  Patient advised to reach out to her therapist to have more frequent sessions.  Skin picking disorder-unstable Increase BuSpar to 30 mg p.o. twice daily Hydroxyzine 25 mg p.o. twice daily as needed for anxiety Continue CBT/DBT   History of borderline personality disorder-continue DBT/CBT  History of alcohol abuse-patient has been sober since 2017.  Follow-up in clinic in 6 to 8 weeks or sooner if needed.   Collaboration of Care: Collaboration of Care: Referral or follow-up with counselor/therapist AEB encouraged to reach out to therapist for more frequent sessions.  Patient/Guardian was advised Release of Information must be obtained prior to any record release in order to collaborate their care with an outside provider. Patient/Guardian was advised if they have not already done so to contact the registration department to sign all necessary forms in order for [name] to release information regarding their care.   Consent: Patient/Guardian gives verbal consent for treatment and assignment of benefits  for services provided during this visit. Patient/Guardian expressed understanding and agreed to proceed.  This note was generated in part or whole with voice recognition software. Voice recognition is usually quite accurate but there are transcription errors that can and very often do occur. I apologize for any typographical errors that were not detected and corrected.     Jomarie Longs, MD 05/15/2022, 1:57 PM

## 2022-05-21 ENCOUNTER — Ambulatory Visit (HOSPITAL_COMMUNITY): Payer: Medicaid Other | Admitting: Licensed Clinical Social Worker

## 2022-06-12 ENCOUNTER — Encounter: Payer: Self-pay | Admitting: Family Medicine

## 2022-06-13 ENCOUNTER — Ambulatory Visit: Payer: Self-pay | Admitting: *Deleted

## 2022-06-13 NOTE — Telephone Encounter (Signed)
Summary: rx concern / rx req   The patient would like to be contacted by a member of clinical staff to further discuss their Semaglutide-Weight Management [facility]) prescription  The patient would also like to discuss the scheduled increase of the medication

## 2022-06-13 NOTE — Telephone Encounter (Signed)
2nd attempt to reach pt, left VM to call back. 

## 2022-06-13 NOTE — Telephone Encounter (Signed)
Attempted to reach pt, left VM to call back each attempt.Routing to provider for resolution per protocol.

## 2022-06-14 ENCOUNTER — Other Ambulatory Visit: Payer: Self-pay

## 2022-06-14 ENCOUNTER — Other Ambulatory Visit: Payer: Self-pay | Admitting: Family Medicine

## 2022-06-14 MED ORDER — SEMAGLUTIDE-WEIGHT MANAGEMENT 1.7 MG/0.75ML ~~LOC~~ SOAJ: 1.7000 mg | SUBCUTANEOUS | 0 refills | Status: DC

## 2022-06-14 NOTE — Telephone Encounter (Signed)
Per patient dose increase request. See MyChart message. Tee'd up

## 2022-06-17 ENCOUNTER — Other Ambulatory Visit: Payer: Self-pay | Admitting: Family Medicine

## 2022-06-18 ENCOUNTER — Encounter (HOSPITAL_COMMUNITY): Payer: Self-pay

## 2022-06-18 ENCOUNTER — Ambulatory Visit (INDEPENDENT_AMBULATORY_CARE_PROVIDER_SITE_OTHER): Payer: Medicaid Other | Admitting: Licensed Clinical Social Worker

## 2022-06-18 DIAGNOSIS — Z91199 Patient's noncompliance with other medical treatment and regimen due to unspecified reason: Secondary | ICD-10-CM

## 2022-06-18 NOTE — Progress Notes (Signed)
LCSW counselor attempted to connect with patient for scheduled appointment via MyChart video text request x 2 and email request with no response.   Attempt 1: Text and email: 4:03p  Attempt 2: Text and email: 4:11p  Video session was closed at : 4:16p  Per [facility] policy, after multiple attempts to reach pt unsuccessfully at appointed time--visit will be coded as no show

## 2022-06-25 ENCOUNTER — Encounter: Payer: Self-pay | Admitting: Family Medicine

## 2022-06-25 ENCOUNTER — Other Ambulatory Visit: Payer: Self-pay | Admitting: Family Medicine

## 2022-06-25 DIAGNOSIS — G4733 Obstructive sleep apnea (adult) (pediatric): Secondary | ICD-10-CM

## 2022-06-25 MED ORDER — SUNOSI 75 MG PO TABS: 1.0000 | ORAL_TABLET | Freq: Every morning | ORAL | 0 refills | Status: DC

## 2022-07-02 ENCOUNTER — Telehealth: Payer: PRIVATE HEALTH INSURANCE | Admitting: Psychiatry

## 2022-07-09 ENCOUNTER — Other Ambulatory Visit: Payer: Self-pay | Admitting: Family Medicine

## 2022-07-17 ENCOUNTER — Encounter: Payer: Self-pay | Admitting: Student in an Organized Health Care Education/Training Program

## 2022-07-17 ENCOUNTER — Other Ambulatory Visit: Payer: Self-pay | Admitting: Family Medicine

## 2022-07-19 ENCOUNTER — Encounter: Payer: Self-pay | Admitting: Psychiatry

## 2022-07-19 ENCOUNTER — Telehealth (INDEPENDENT_AMBULATORY_CARE_PROVIDER_SITE_OTHER): Payer: PRIVATE HEALTH INSURANCE | Admitting: Psychiatry

## 2022-07-19 DIAGNOSIS — F4310 Post-traumatic stress disorder, unspecified: Secondary | ICD-10-CM | POA: Diagnosis not present

## 2022-07-19 DIAGNOSIS — F1011 Alcohol abuse, in remission: Secondary | ICD-10-CM | POA: Diagnosis not present

## 2022-07-19 DIAGNOSIS — F424 Excoriation (skin-picking) disorder: Secondary | ICD-10-CM

## 2022-07-19 DIAGNOSIS — Z8659 Personal history of other mental and behavioral disorders: Secondary | ICD-10-CM

## 2022-07-19 DIAGNOSIS — F411 Generalized anxiety disorder: Secondary | ICD-10-CM

## 2022-07-19 MED ORDER — BUPROPION HCL ER (XL) 150 MG PO TB24: 150.0000 mg | ORAL_TABLET | Freq: Every day | ORAL | 0 refills | Status: DC

## 2022-07-19 NOTE — Progress Notes (Signed)
Virtual Visit via Video Note  I connected with Barbara Pennington on 07/19/22 at  2:30 PM EDT by a video enabled telemedicine application and verified that I am speaking with the correct person using two identifiers.  Location Provider Location : Car Patient Location : Home  Participants: Patient , Provider    I discussed the limitations of evaluation and management by telemedicine and the availability of in person appointments. The patient expressed understanding and agreed to proceed.   I discussed the assessment and treatment plan with the patient. The patient was provided an opportunity to ask questions and all were answered. The patient agreed with the plan and demonstrated an understanding of the instructions.   The patient was advised to call back or seek an in-person evaluation if the symptoms worsen or if the condition fails to improve as anticipated.   BH MD OP Progress Note  07/20/2022 1:14 PM Barbara Pennington  MRN:  [id number]  Chief Complaint:  Chief Complaint  Patient presents with   Follow-up   Depression   Medication Refill   HPI: Barbara Pennington is a 42 year old Caucasian female, divorced, currently lives in [place], has a history of PTSD, anxiety, borderline personality disorder, morbid obesity, OSA on CPAP, hypertension/chronic kidney disease, asthma, chronic back pain, right-sided knee injury was evaluated for medication management by telemedicine today.  Patient reports she continues to have anxiety symptoms, reports she has good days and bad days.  Patient also reports skin picking.  Reports she tried the higher dosage of BuSpar however does not know how much it has helped.  She is not interested in further medication dosage increase since her goal was to get off of some of these medications eventually and feels sad about the part that she keeps adding more and more medications to her regimen.  Patient reports work is going well, she has been able to function okay at  work.  Patient reports sleep is good.  Denies any suicidality, homicidality or perceptual disturbances.  Patient reports she has not had psychotherapy sessions with her therapist Ms. Christina Hussami since the last visit with this provider.  Per review of medical records it looks like her last appointment with her therapist was in April.  She had a no-show in June.  Patient reports she has reached out to her therapist to apologize for that and is motivated to restart therapy.  Patient denies any other concerns today.  Visit Diagnosis:    ICD-10-CM   1. PTSD (post-traumatic stress disorder)  F43.10 buPROPion (WELLBUTRIN XL) 150 MG 24 hr tablet    2. GAD (generalized anxiety disorder)  F41.1     3. 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 History of borderline personality disorder  Z86.59     5. History of alcohol abuse  F10.11       Past Psychiatric History: I have reviewed past psychiatric history from progress note on 05/05/2021.  Patient was previously under the care of [facility].  Past Medical History:  Past Medical History:  Diagnosis Date   Anxiety    Arthritis 2006   rhuematoid - mild - no current issues   Asthma 2003   Borderline personality disorder (HCC)    Chronic kidney disease    stage 3   Complication of anesthesia    "Usually" has low temp after surgery.After hysterectomy temp was 94   Depression    GERD (gastroesophageal reflux disease)    History of kidney stones  Hyperlipidemia    Hypertension 2013   Lump or mass in breast 2013   RIGHT BREAST   Motion sickness    repeated amusement park rides   PTSD (post-traumatic stress disorder)    Scoliosis    no current issues   Shortness of breath dyspnea    secondary to weight    Sleep apnea    uses cpap   Ulcer 2001    Past Surgical History:  Procedure Laterality Date   ABDOMINAL HYSTERECTOMY  01/08/2006   BREAST BIOPSY Left 01/09/2011   CORE - FIBROADENOMA VS PHYLLODES   CESAREAN SECTION  01/08/2005    COLONOSCOPY WITH PROPOFOL N/A 10/09/2016   Procedure: COLONOSCOPY WITH PROPOFOL;  Surgeon: Wyline Mood, MD;  [facility];  Service: Gastroenterology;  Laterality: N/A;   COLONOSCOPY WITH PROPOFOL N/A 11/09/2019   Procedure: COLONOSCOPY WITH PROPOFOL;  Surgeon: Toney Reil, MD;  Location: [facility] ENDOSCOPY;  Service: Gastroenterology;  Laterality: N/A;   ESOPHAGOGASTRODUODENOSCOPY     GUM SURGERY  01/08/2002   KNEE ARTHROSCOPY WITH MEDIAL MENISECTOMY Right 07/24/2021   Procedure: Right medial meniscus root repair and patella chondroplasty;  Surgeon: Signa Kell, MD;  Location: [facility] ORS;  Service: Orthopedics;  Laterality: Right;   PLANTAR FASCIA RELEASE Left 08/19/2014   Procedure: ENDOSCOPIC PLANTAR FASCIOTOMY RELEASE L WITH TOPAZ;  Surgeon: Recardo Evangelist, DPM;  Location: [facility];  Service: Podiatry;  Laterality: Left;  LMA WITH POPLITEAL BLOCK   TONSILLECTOMY  01/08/1982    Family Psychiatric History: I have reviewed family psychiatric history from progress note on 05/05/2021.  Family History:  Family History  Problem Relation Age of Onset   Obesity Mother    Diabetes Mother    High Cholesterol Mother    Hypertension Mother    Neuropathy Mother    Depression Mother    Anxiety disorder Mother    Obesity Father    Hypertension Father    High Cholesterol Father    Depression Father    Anxiety disorder Father    Seizures Brother    Testicular cancer Brother    Breast cancer Maternal Grandmother    Hypertension Maternal Grandmother    Aneurysm Maternal Grandmother    Stroke Maternal Grandmother    Hypertension Maternal Grandfather    High Cholesterol Maternal Grandfather    Heart disease Maternal Grandfather        has a pacemaker   Stroke Maternal Grandfather    Alzheimer's disease Paternal Grandmother    Dementia Paternal Grandmother    Colon cancer Paternal Grandfather    Mental illness Maternal Aunt    Suicidality Cousin     Social  History: I have reviewed social history from progress note on 05/05/2021. Social History   Socioeconomic History   Marital status: Divorced    Spouse name: Not on file   Number of children: 2   Years of education: 14   Highest education level: Bachelor's degree (e.g., BA, AB, BS)  Occupational History   Not on file  Tobacco Use   Smoking status: Former    Current packs/day: 0.00    Average packs/day: 0.3 packs/day for 10.0 years (2.5 ttl pk-yrs)    Types: Cigarettes    Start date: 07/04/2004    Quit date: 07/05/2014    Years since quitting: 8.0   Smokeless tobacco: Never  Vaping Use   Vaping status: Never Used  Substance and Sexual Activity   Alcohol use: Yes    Comment: rare   Drug use: No  Sexual activity: Yes    Partners: Male    Birth control/protection: Surgical    Comment: 2007  Other Topics Concern   Not on file  Social History Narrative   Not on file   Social Determinants of Health   Financial Resource Strain: High Risk (04/23/2022)   Overall Financial Resource Strain (CARDIA)    Difficulty of Paying Living Expenses: Hard  Food Insecurity: Food Insecurity Present (04/23/2022)   Hunger Vital Sign    Worried About Running Out of Food in the Last Year: Often true    Ran Out of Food in the Last Year: Sometimes true  Transportation Needs: No Transportation Needs (04/23/2022)   PRAPARE - Administrator, Civil Service (Medical): No    Lack of Transportation (Non-Medical): No  Physical Activity: Unknown (04/23/2022)   Exercise Vital Sign    Days of Exercise per Week: 0 days    Minutes of Exercise per Session: Not on file  Stress: Stress Concern Present (04/23/2022)   [employer] of Occupational Health - Occupational Stress Questionnaire    Feeling of Stress : Rather much  Social Connections: Socially Isolated (04/23/2022)   Social Connection and Isolation Panel [NHANES]    Frequency of Communication with Friends and Family: Twice a week    Frequency  of Social Gatherings with Friends and Family: Never    Attends Religious Services: Never    Database administrator or Organizations: No    Attends Engineer, structural: Not on file    Marital Status: Divorced    Allergies:  Allergies  Allergen Reactions   Latex Rash   Metformin And Related     diarrhea    Metabolic Disorder Labs: Lab Results  Component Value Date   HGBA1C 5.5 12/15/2021   MPG 111 12/15/2021   MPG 105 12/06/2020   No results found for: "PROLACTIN" Lab Results  Component Value Date   CHOL 186 12/15/2021   TRIG 73 12/15/2021   HDL 60 12/15/2021   CHOLHDL 3.1 12/15/2021   VLDL 27 08/06/2019   LDLCALC 110 (H) 12/15/2021   LDLCALC 111 (H) 12/06/2020   Lab Results  Component Value Date   TSH 1.83 12/15/2021   TSH 2.59 04/04/2018    Therapeutic Level Labs: No results found for: "LITHIUM" No results found for: "VALPROATE" No results found for: "CBMZ"  Current Medications: Current Outpatient Medications  Medication Sig Dispense Refill   acetaminophen (TYLENOL) 500 MG tablet Take 2 tablets (1,000 mg total) by mouth every 8 (eight) hours. 90 tablet 2   albuterol (VENTOLIN HFA) 108 (90 Base) MCG/ACT inhaler Inhale 2 puffs into the lungs every 4 (four) hours as needed for wheezing or shortness of breath. 9 g 0   amLODipine (NORVASC) 2.5 MG tablet Take 1 tablet (2.5 mg total) by mouth every morning. 90 tablet 0   atorvastatin (LIPITOR) 20 MG tablet Take 1 tablet (20 mg total) by mouth every morning. 90 tablet 1   buPROPion (WELLBUTRIN XL) 150 MG 24 hr tablet Take 1 tablet (150 mg total) by mouth daily with breakfast. Stop wellbutrin xl 300 mg 90 tablet 0   busPIRone (BUSPAR) 30 MG tablet Take 1 tablet (30 mg total) by mouth 2 (two) times daily. 180 tablet 0   desloratadine (CLARINEX) 5 MG tablet Take 1 tablet (5 mg total) by mouth daily. 30 tablet 11   escitalopram (LEXAPRO) 20 MG tablet Take 1 tablet (20 mg total) by mouth daily. 90 tablet 1  fluticasone (FLONASE) 50 MCG/ACT nasal spray Place 1 spray into both nostrils daily. 18.2 mL 6   gabapentin (NEURONTIN) 300 MG capsule Take 1 capsule by mouth 3 (three) times daily.     ipratropium-albuterol (DUONEB) 0.5-2.5 (3) MG/3ML SOLN Take 3 mLs by nebulization 3 (three) times daily as needed (asthma/bronchitis sx, cough wheeze sob). 180 mL 1   Melatonin 10 MG CAPS Take 10-20 mg by mouth at bedtime.     metaxalone (SKELAXIN) 800 MG tablet Take 1 tablet (800 mg total) by mouth 3 (three) times daily as needed for muscle spasms. 270 tablet 0   montelukast (SINGULAIR) 10 MG tablet Take 1 tablet (10 mg total) by mouth at bedtime. 90 tablet 1   Semaglutide-Weight Management (WEGOVY) 2.4 MG/0.75ML SOAJ Inject 2.4 mg into the skin once a week. 3 mL 0   Solriamfetol HCl (SUNOSI) 75 MG TABS Take 1 tablet (75 mg total) by mouth in the morning. 30 tablet 0   valsartan-hydrochlorothiazide (DIOVAN-HCT) 320-12.5 MG tablet Take 1 tablet by mouth daily. 90 tablet 0   No current facility-administered medications for this visit.     Musculoskeletal: Strength & Muscle Tone:  UTA Gait & Station:  Seated Patient leans: N/A  Psychiatric Specialty Exam: Review of Systems  Psychiatric/Behavioral:  The patient is nervous/anxious.        Skin picking    There were no vitals taken for this visit.There is no height or weight on file to calculate BMI.  General Appearance: Casual  Eye Contact:  Fair  Speech:  Clear and Coherent  Volume:  Normal  Mood:  Anxious  Affect:  Congruent  Thought Process:  Goal Directed and Descriptions of Associations: Intact  Orientation:  Full (Time, Place, and Person)  Thought Content: Logical   Suicidal Thoughts:  No  Homicidal Thoughts:  No  Memory:  Immediate;   Fair Recent;   Fair Remote;   Fair  Judgement:  Fair  Insight:  Fair  Psychomotor Activity:  Normal  Concentration:  Concentration: Fair and Attention Span: Fair  Recall:  [employer] of Knowledge: Fair   Language: Fair  Akathisia:  No  Handed:  Right  AIMS (if indicated): not done  Assets:  Communication Skills Desire for Improvement Housing Social Support  ADL's:  Intact  Cognition: WNL  Sleep:  Fair   Screenings: AIMS    Flowsheet Row Video Visit from 06/13/2021 in [facility] Office Visit from 05/05/2021 in [facility]  AIMS Total Score 0 0      GAD-7    Flowsheet Row Counselor from 10/30/2021 in [facility] Visit from 05/30/2021 in [facility] Video Visit from 05/26/2021 in [facility] Office Visit from 05/05/2021 in [facility] Office Visit from 04/03/2021 in [facility] at [facility]  Total GAD-7 Score 21 11 7 17 7       [id number]    Flowsheet Row Office Visit from 04/24/2022 in [facility] Office Visit from 03/20/2022 in [facility] Office Visit from 01/11/2022 in [facility] Video Visit from 01/02/2022 in [facility] Video Visit from 12/19/2021 in [facility]  PHQ-2 Total Score 1 0 0 0 1  PHQ-9 Total Score 7 6 -- 4 --  Flowsheet Row Video Visit from 07/19/2022 in [facility] Video Visit from 05/14/2022 in [facility] Counselor from 04/10/2022 in [facility] at [facility] RISK CATEGORY Low Risk Low Risk Error: Q3, 4, or 5 should not be populated when Q2 is No        Assessment and Plan: Barbara Pennington is a 42 year old Caucasian female who has a history of PTSD, borderline personality disorder, depression was evaluated by telemedicine  today.  Patient is currently having anxiety as well as skin picking, not much improvement on the higher dosage of BuSpar although she has been noncompliant with psychotherapy sessions as recommended, discussed plan as noted below.  Plan PTSD-improving Lexapro 20 mg p.o. daily Wellbutrin XL 150 mg p.o. daily in the morning-reduced dosage Continue CBT with Ms. Christina Hussami -patient has been noncompliant  GAD-unstable Continue BuSpar 30 mg p.o. twice daily Continue Lexapro 20 mg p.o. daily Gabapentin 300 mg p.o. 3 times daily prescribed by primary care provider for pain which also helps with anxiety Patient to have more frequent psychotherapy sessions, patient's last therapy session was in April.  Provided education, advised patient to have frequent sessions and once she has these sessions and if that also does not help address her anxiety and skin picking symptoms then will consider changing medications like Lexapro to another SSRI or SNRI.  Skin picking disorder-unstable Encouraged patient to restart CBT. Continue BuSpar 30 mg p.o. twice daily Lexapro 20 mg p.o. daily  History of Personality disorder-continue DBT/CBT.  History of alcohol abuse-patient has been sober since 2017.  Follow-up in clinic in 2 months or sooner in person.  Collaboration of Care: Collaboration of Care: Referral or follow-up with counselor/therapist AEB encouraged to have more frequent sessions with her therapist Ms. Christina Hussami  Patient/Guardian was advised Release of Information must be obtained prior to any record release in order to collaborate their care with an outside provider. Patient/Guardian was advised if they have not already done so to contact the registration department to sign all necessary forms in order for [name] to release information regarding their care.   Consent: Patient/Guardian gives verbal consent for treatment and assignment of benefits for services provided during this visit.  Patient/Guardian expressed understanding and agreed to proceed.   This note was generated in part or whole with voice recognition software. Voice recognition is usually quite accurate but there are transcription errors that can and very often do occur. I apologize for any typographical errors that were not detected and corrected.    Jomarie Longs, MD 07/20/2022, 1:14 PM

## 2022-07-20 ENCOUNTER — Ambulatory Visit (INDEPENDENT_AMBULATORY_CARE_PROVIDER_SITE_OTHER): Payer: PRIVATE HEALTH INSURANCE | Admitting: Licensed Clinical Social Worker

## 2022-07-20 DIAGNOSIS — F4310 Post-traumatic stress disorder, unspecified: Secondary | ICD-10-CM | POA: Diagnosis not present

## 2022-07-20 NOTE — Addendum Note (Signed)
Addended by: Rozanna Box on: 07/20/2022 10:35 AM   Modules accepted: Level of Service

## 2022-07-20 NOTE — Patient Instructions (Signed)
Outpatient Psychiatry and Counseling  FOR CRISIS:  call 911, [facility]: Mobile Crisis Management 24 hours:  [phone], call [phone], [facility] ([facility]) [address] walk in, or go to your local EMERGENCY DEPARTMENT  [facility] [address], [place], [place] [address]  [phone]  [facility] [address] [place], [place] [address] [phone]  [facility] Address: [address] [address], [place], [place] [address] Phone: [phone]  [facility] ([place] and [place] Locations) [email or web address]  [facility] sliding scale fee and walk in schedule: M-F 8am-12pm/1pm-3pm [address]  [place], [place] [address] [phone]  [facility] [address] [place], [place] [address] [phone]  [facility]/ Intensive Outpatient Therapy Program/CDIOP/PHP [address] [place], [place] [address] [phone]  [facility]                  Crisis Services, Outpatient Therapy Services, Walk in Services      [phone]     [address]    [place], [place] [address]                 [facility]   [facility] [phone] [address] [place], [place] [address]  [facility]          [address],  [place], [place] [address]       [phone]  [facility] [address] [place], [place] [address] [phone]  [facility]    [address]    [place], [place] [address]     [phone]       [facility] [address] [place] [address]  [facility]     [address]     [place], [place]      [phone]       [facility] Shamsher Ahluwalia, MD [address]  [address] [place], [place] [address] [phone]  [facility]     [address]     [place], [place] [address]     [phone]       [facility] [address] [place], [place] [address] [phone] Resources for Temporary Residential Assistance/Crisis Centers  
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
## 2022-07-26 ENCOUNTER — Encounter: Payer: Self-pay | Admitting: Family Medicine

## 2022-07-30 NOTE — Progress Notes (Unsigned)
Name: Barbara Pennington   MRN: [id number]    DOB: 1980-04-16   Date:07/31/2022       Progress Note  Subjective  Chief Complaint  Follow Up  HPI  Morbid Obesity: her weight was 180 lbs when she graduated HS, when she graduated college/after birth of her first child her weight was up 256 lbs (January 2004 after his birth), she lost weight between 2009-2010 due to severe depression down to around 180 lbs, she gradually started to gain weight again and stay around 220 lbs around 216 lbs, but had plantar fascitis and had surgery so she gained weight after that. In 2019 she injury her back at work and lack of activity also caused more weight gain. Her heaviest weight was in April at 357 lbs today is down to 327 lbs today. She started [facility] in March 2024 .She has noticed that [facility] January 0.25 but had a lot of gaps in filling rx due to [employer], she has been consistently taking medication since Spring 2024  is not curbing her cravings as much. She is still trying to walk even though it causes knee pain. Discussed eating protein first, followed by green veggies or salads and finish with starches   She has tried Rybelsus samples 3 mg and was able to lose 7 lbs, GLP-1 agonist would be very beneficial for her, metformin caused diarrhea .  She is morbidly obese with OSA, OA, GERD, CKI, glucose intolerance and dyslipidemia  She has tried [employer] and [employer] ,Doylene Bode  Metabolic syndrome with obesity: responded well to GLP-1 in the past for pre-diabetes. She is taking [place]   HTN/ Chronic kidney disease:She states she is following up with Nephrology for monitoring. She is compliant with medication, taking norvasc 2.5 mg and valsartan hctz 320/12.5 mg , BP is at goal, she is losing weight and we will continue monitoring   History of right knee surgery: done 07/17, had right meniscal repair and patella chondroplasty. She is seeing Dr. Signa Kell, now she has more left knee pain, she  had some injections   Chronic back pain: she states standing causes increase of pain, even sitting has to shift positions frequently. She had a stand up desk, but was given to another employee. Aching pain, worse on right lower back, no radiculitis. She states she is doing better since she started to see a chiropractor Unchanged   CKI : stage III, advised to stay off nsaid's. She has good urine output , no pruritus last GFR 59, she is taking ARB and losing weight   Depression/GAD/personality disorder: under the care of Dr. Elna Breslow, who she sees about every 3 months, she also has a therapist who she sees at least once a month, she is  compliant with medications and states feeling well this week, phq 9 today is negative . Continue current regiment. She states her therapist is leaving soon   Asthma mild intermittent: she saw pulmonologist for increase in SOB and had echo , PFT and esophageal test, all normal , she is using nasal spray. Advised to go back and discuss results with Dr. Wilhemina Bonito   OSA: she has been using CPAP she states wears it every night but still has severe daytime somnolence and sometimes dozes off while driving. We gave her modafinil one month ago, she did not take it today, states initially it helped with alertness but gets very sluggish between noon and 2 pm even when she has a light lunch , we  sent a rx for [facility] but she never got it filled and we will try again today   Patient Active Problem List   Diagnosis Date Noted   Chronic cough 02/13/2022   Skin-picking disorder 12/19/2021   Adjustment disorder with depressed mood 06/13/2021   Closed nondisplaced longitudinal fracture of right patella 06/09/2021   GAD (generalized anxiety disorder) 05/05/2021   Depressive disorder 05/05/2021   History of borderline personality disorder 05/05/2021   History of alcohol abuse 05/05/2021   Internal derangement of left knee 04/03/2021   Primary osteoarthritis of left knee 04/03/2021    Metabolic syndrome 03/24/2021   Impaired fasting glucose 12/06/2020   Lymphedema 12/06/2020   Moderate episode of recurrent major depressive disorder (HCC) 02/08/2020   CKD (chronic kidney disease) stage 3, GFR 30-59 ml/min (HCC) 06/11/2019   PTSD (post-traumatic stress disorder) 01/14/2019   Borderline personality disorder (HCC) 01/14/2019   Anxiety 01/14/2019   Mixed hyperlipidemia 01/14/2019   Morbid obesity with BMI of 50.0-59.9, adult (HCC) 01/14/2019   OSA on CPAP 01/14/2019   Asthma, mild persistent 04/30/2018   Low back pain 08/20/2017   Fibroadenoma of breast, 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   Essential hypertension     Past Surgical History:  Procedure Laterality Date   ABDOMINAL HYSTERECTOMY  01/08/2006   BREAST BIOPSY Left 01/09/2011   CORE - FIBROADENOMA VS PHYLLODES   CESAREAN SECTION  01/08/2005   COLONOSCOPY WITH PROPOFOL N/A 10/09/2016   Procedure: COLONOSCOPY WITH PROPOFOL;  Surgeon: Wyline Mood, MD;  Location: [facility] END[facility]COPY;  Service: Gastroenterology;  Laterality: N/A;   COLONOSCOPY WITH PROPOFOL N/A 11/09/2019   Procedure: COLONOSCOPY WITH PROPOFOL;  Surgeon: Toney Reil, MD;  Location: [facility] ENDOSCOPY;  Service: Gastroenterology;  Laterality: N/A;   ESOPHAGOGASTRODUODENOSCOPY     GUM SURGERY  01/08/2002   KNEE ARTHROSCOPY WITH MEDIAL MENISECTOMY Right 07/24/2021   Procedure: Right medial meniscus root repair and patella chondroplasty;  Surgeon: Signa Kell, MD;  Location: [facility] ORS;  Service: Orthopedics;  Laterality: Right;   PLANTAR FASCIA RELEASE Left 08/19/2014   Procedure: ENDOSCOPIC PLANTAR FASCIOTOMY RELEASE L WITH TOPAZ;  Surgeon: Recardo Evangelist, DPM;  Location: [facility];  Service: Podiatry;  Laterality: Left;  LMA WITH POPLITEAL BLOCK   TONSILLECTOMY  01/08/1982    Family History  Problem Relation Age of Onset   Obesity Mother    Diabetes Mother    High Cholesterol Mother    Hypertension Mother    Neuropathy Mother    Depression Mother     Anxiety disorder Mother    Obesity Father    Hypertension Father    High Cholesterol Father    Depression Father    Anxiety disorder Father    Seizures Brother    Testicular cancer Brother    Breast cancer Maternal Grandmother    Hypertension Maternal Grandmother    Aneurysm Maternal Grandmother    Stroke Maternal Grandmother    Hypertension Maternal Grandfather    High Cholesterol Maternal Grandfather    Heart disease Maternal Grandfather        has a pacemaker   Stroke Maternal Grandfather    Alzheimer's disease Paternal Grandmother    Dementia Paternal Grandmother    Colon cancer Paternal Grandfather    Mental illness Maternal Aunt    Suicidality Cousin     Social History   Tobacco Use   Smoking status: Former    Current packs/day: 0.00    Average packs/day: 0.3 packs/day for 10.0 years (2.5 ttl pk-yrs)    Types:  Cigarettes    Start date: 07/04/2004    Quit date: 07/05/2014    Years since quitting: 8.0   Smokeless tobacco: Never  Substance Use Topics   Alcohol use: Yes    Comment: rare     Current Outpatient Medications:    albuterol (VENTOLIN HFA) 108 (90 Base) MCG/ACT inhaler, Inhale 2 puffs into the lungs every 4 (four) hours as needed for wheezing or shortness of breath., Disp: 9 g, Rfl: 0   amLODipine (NORVASC) 2.5 MG tablet, Take 1 tablet (2.5 mg total) by mouth every morning., Disp: 90 tablet, Rfl: 0   atorvastatin (LIPITOR) 20 MG tablet, Take 1 tablet (20 mg total) by mouth every morning., Disp: 90 tablet, Rfl: 1   buPROPion (WELLBUTRIN XL) 150 MG 24 hr tablet, Take 1 tablet (150 mg total) by mouth daily with breakfast. Stop wellbutrin xl 300 mg, Disp: 90 tablet, Rfl: 0   busPIRone (BUSPAR) 30 MG tablet, Take 1 tablet (30 mg total) by mouth 2 (two) times daily., Disp: 180 tablet, Rfl: 0   desloratadine (CLARINEX) 5 MG tablet, Take 1 tablet (5 mg total) by mouth daily., Disp: 30 tablet, Rfl: 11   escitalopram (LEXAPRO) 20 MG tablet, Take 1 tablet (20 mg total)  by mouth daily., Disp: 90 tablet, Rfl: 1   fluticasone (FLONASE) 50 MCG/ACT nasal spray, Place 1 spray into both nostrils daily., Disp: 18.2 mL, Rfl: 6   gabapentin (NEURONTIN) 300 MG capsule, Take 1 capsule by mouth 3 (three) times daily., Disp: , Rfl:    ipratropium-albuterol (DUONEB) 0.5-2.5 (3) MG/3ML SOLN, Take 3 mLs by nebulization 3 (three) times daily as needed (asthma/bronchitis sx, cough wheeze sob)., Disp: 180 mL, Rfl: 1   Melatonin 10 MG CAPS, Take 10-20 mg by mouth at bedtime., Disp: , Rfl:    metaxalone (SKELAXIN) 800 MG tablet, Take 1 tablet (800 mg total) by mouth 3 (three) times daily as needed for muscle spasms., Disp: 270 tablet, Rfl: 0   montelukast (SINGULAIR) 10 MG tablet, Take 1 tablet (10 mg total) by mouth at bedtime., Disp: 90 tablet, Rfl: 1   Semaglutide-Weight Management (WEGOVY) 2.4 MG/0.75ML SOAJ, Inject 2.4 mg into the skin once a week., Disp: 3 mL, Rfl: 0   Solriamfetol HCl (SUNOSI) 75 MG TABS, Take 1 tablet (75 mg total) by mouth in the morning., Disp: 30 tablet, Rfl: 0   valsartan-hydrochlorothiazide (DIOVAN-HCT) 320-12.5 MG tablet, Take 1 tablet by mouth daily., Disp: 90 tablet, Rfl: 0  Allergies  Allergen Reactions   Latex Rash   Metformin And Related     diarrhea    I personally reviewed active problem list, medication list, allergies, family history, social history, health maintenance with the patient/caregiver today.   ROS  Ten systems reviewed and is negative except as mentioned in HPI    Objective  Vitals:   07/31/22 0742  BP: 128/70  Pulse: 81  Resp: 16  SpO2: 96%  Weight: (!) 327 lb (148.3 kg)  Height: 5\' 4"  (1.626 m)    Body mass index is 56.13 kg/m.  Physical Exam  Constitutional: Patient appears well-developed and well-nourished. Obese  No distress.  HEENT: head atraumatic, normocephalic, pupils equal and reactive to light, neck supple, throat within normal limits Cardiovascular: Normal rate, regular rhythm and normal heart  sounds.  No murmur heard. No BLE edema. Pulmonary/Chest: Effort normal and breath sounds normal. No respiratory distress. Abdominal: Soft.  There is no tenderness. Psychiatric: Patient has a normal mood and affect. behavior is normal. Judgment  and thought content normal.    PHQ2/9:    07/31/2022    7:42 AM 07/20/2022   11:03 AM 04/24/2022    9:45 AM 03/20/2022    7:43 AM 01/11/2022    8:03 AM  Depression screen PHQ 2/9  Decreased Interest 0  1 0 0  Down, Depressed, Hopeless 0  0 0 0  PHQ - 2 Score 0  1 0 0  Altered sleeping 3  3 3    Tired, decreased energy 3  3 3    Change in appetite 0  0 0   Feeling bad or failure about yourself  0  0 0   Trouble concentrating 0  0 0   Moving slowly or fidgety/restless 0  0 0   Suicidal thoughts 0  0 0   PHQ-9 Score 6  7 6    Difficult doing work/chores          Information is confidential and restricted. Go to Review Flowsheets to unlock data.    phq 9 is positive   Fall Risk:    07/31/2022    7:41 AM 04/24/2022    9:26 AM 03/20/2022    7:42 AM 01/11/2022    8:02 AM 01/02/2022   10:21 AM  Fall Risk   Falls in the past year? 1 1 1 1 1   Number falls in past yr: 1 1 1 1 1   Injury with Fall? 0 1 1 1  0  Risk for fall due to : No Fall Risks No Fall Risks No Fall Risks History of fall(s) No Fall Risks  Follow up Falls prevention discussed Falls prevention discussed Falls prevention discussed Falls evaluation completed Falls prevention discussed      Functional Status Survey: Is the patient deaf or have difficulty hearing?: No Does the patient have difficulty seeing, even when wearing glasses/contacts?: No Does the patient have difficulty concentrating, remembering, or making decisions?: Yes Does the patient have difficulty walking or climbing stairs?: Yes Does the patient have difficulty dressing or bathing?: No Does the patient have difficulty doing errands alone such as visiting a doctor's office or shopping?: No    Assessment &  Plan  1. Essential hypertension  - amLODipine (NORVASC) 2.5 MG tablet; Take 1 tablet (2.5 mg total) by mouth every morning.  Dispense: 90 tablet; Refill: 0 - valsartan-hydrochlorothiazide (DIOVAN-HCT) 320-12.5 MG tablet; Take 1 tablet by mouth daily.  Dispense: 90 tablet; Refill: 0  2. Stage 3a chronic kidney disease (HCC)  - amLODipine (NORVASC) 2.5 MG tablet; Take 1 tablet (2.5 mg total) by mouth every morning.  Dispense: 90 tablet; Refill: 0 - valsartan-hydrochlorothiazide (DIOVAN-HCT) 320-12.5 MG tablet; Take 1 tablet by mouth daily.  Dispense: 90 tablet; Refill: 0  3. OSA on CPAP  - Solriamfetol HCl (SUNOSI) 75 MG TABS; Take 1 tablet (75 mg total) by mouth in the morning.  Dispense: 30 tablet; Refill: 0   4. Primary osteoarthritis of left knee  Seeing Ortho  5. Daytime somnolence  Sending Sunosi   6. Morbid obesity with BMI of 50.0-59.9, adult (HCC)  - Semaglutide-Weight Management (WEGOVY) 2.4 MG/0.75ML SOAJ; Inject 2.4 mg into the skin once a week.  Dispense: 9 mL; Refill: 0  7. Metabolic syndrome

## 2022-07-31 ENCOUNTER — Encounter: Payer: Self-pay | Admitting: Family Medicine

## 2022-07-31 ENCOUNTER — Ambulatory Visit (INDEPENDENT_AMBULATORY_CARE_PROVIDER_SITE_OTHER): Payer: 59 | Admitting: Family Medicine

## 2022-07-31 VITALS — BP 128/70 | HR 81 | Resp 16 | Ht 64.0 in | Wt 327.0 lb

## 2022-07-31 DIAGNOSIS — I10 Essential (primary) hypertension: Secondary | ICD-10-CM | POA: Diagnosis not present

## 2022-07-31 DIAGNOSIS — M1712 Unilateral primary osteoarthritis, left knee: Secondary | ICD-10-CM

## 2022-07-31 DIAGNOSIS — R400 Somnolence: Secondary | ICD-10-CM

## 2022-07-31 DIAGNOSIS — Z6841 Body Mass Index (BMI) 40.0 and over, adult: Secondary | ICD-10-CM

## 2022-07-31 DIAGNOSIS — G4733 Obstructive sleep apnea (adult) (pediatric): Secondary | ICD-10-CM

## 2022-07-31 DIAGNOSIS — E8881 Metabolic syndrome: Secondary | ICD-10-CM

## 2022-07-31 DIAGNOSIS — N1831 Chronic kidney disease, stage 3a: Secondary | ICD-10-CM

## 2022-07-31 MED ORDER — VALSARTAN-HYDROCHLOROTHIAZIDE 320-12.5 MG PO TABS: 1.0000 | ORAL_TABLET | Freq: Every day | ORAL | 0 refills | Status: DC

## 2022-07-31 MED ORDER — AMLODIPINE BESYLATE 2.5 MG PO TABS: 2.5000 mg | ORAL_TABLET | Freq: Every morning | ORAL | 0 refills | Status: DC

## 2022-07-31 MED ORDER — SUNOSI 75 MG PO TABS: 1.0000 | ORAL_TABLET | Freq: Every morning | ORAL | 0 refills | Status: DC

## 2022-07-31 MED ORDER — WEGOVY 2.4 MG/0.75ML ~~LOC~~ SOAJ: 2.4000 mg | SUBCUTANEOUS | 0 refills | Status: DC

## 2022-08-28 ENCOUNTER — Telehealth: Payer: Self-pay

## 2022-08-28 DIAGNOSIS — F411 Generalized anxiety disorder: Secondary | ICD-10-CM

## 2022-08-28 DIAGNOSIS — Z8659 Personal history of other mental and behavioral disorders: Secondary | ICD-10-CM

## 2022-08-28 DIAGNOSIS — F4310 Post-traumatic stress disorder, unspecified: Secondary | ICD-10-CM

## 2022-08-28 DIAGNOSIS — F424 Excoriation (skin-picking) disorder: Secondary | ICD-10-CM

## 2022-08-28 MED ORDER — BUSPIRONE HCL 30 MG PO TABS: 30.0000 mg | ORAL_TABLET | Freq: Two times a day (BID) | ORAL | 1 refills | Status: DC

## 2022-08-28 NOTE — Telephone Encounter (Signed)
I have sent BuSpar 30 mg twice daily to pharmacy at [facility].

## 2022-08-28 NOTE — Telephone Encounter (Signed)
pharmacy called states that they needs a 90 day supply for the buspirone 30mg . the orginial rx was sent to a mail order pharmacy and they will not transfer the rx. pt now uses [facility] and so they need a new rx sent. pt was last seen on 7-11 next appt 9-11

## 2022-08-29 MED ORDER — ESCITALOPRAM OXALATE 20 MG PO TABS: 20.0000 mg | ORAL_TABLET | Freq: Every day | ORAL | 1 refills | Status: DC

## 2022-08-29 NOTE — Telephone Encounter (Signed)
I have sent escitalopram 20 mg to pharmacy.

## 2022-08-29 NOTE — Telephone Encounter (Signed)
pt was notifed but she also stated that she needed the escitalopram 20mg  also sent to [facility] for 90 day supply

## 2022-08-29 NOTE — Telephone Encounter (Signed)
left patient a message that rx was sent to the pharmacy

## 2022-09-02 ENCOUNTER — Other Ambulatory Visit: Payer: Self-pay | Admitting: Family Medicine

## 2022-09-02 DIAGNOSIS — G4733 Obstructive sleep apnea (adult) (pediatric): Secondary | ICD-10-CM

## 2022-09-03 ENCOUNTER — Other Ambulatory Visit: Payer: Self-pay | Admitting: Family Medicine

## 2022-09-03 ENCOUNTER — Other Ambulatory Visit: Payer: Self-pay

## 2022-09-03 DIAGNOSIS — G4733 Obstructive sleep apnea (adult) (pediatric): Secondary | ICD-10-CM

## 2022-09-03 NOTE — Telephone Encounter (Signed)
Medication Refill - Medication: Semaglutide-Weight Management 2.4 MG/0.75ML INJECT 2.4 MG U Pt requesting 90 day supply of this med  Has the patient contacted their pharmacy? yes (Agent: If no, request that the patient contact the pharmacy for the refill. If patient does not wish to contact the pharmacy document the reason why and proceed with request.) (Agent: If yes, when and what did the pharmacy advise?)contact pcp  Preferred Pharmacy (with phone number or street name):  [facility] [address] [place]), [place] - [address] Phone: [phone]  Fax: [phone]     Has the patient been seen for an appointment in the last year OR does the patient have an upcoming appointment? yes  Agent: Please be advised that RX refills may take up to 3 business days. We ask that you follow-up with your pharmacy.

## 2022-09-05 NOTE — Telephone Encounter (Signed)
3 Month followup made per sowles

## 2022-09-05 NOTE — Telephone Encounter (Signed)
3 month Followup made

## 2022-09-19 ENCOUNTER — Ambulatory Visit (INDEPENDENT_AMBULATORY_CARE_PROVIDER_SITE_OTHER): Payer: 59 | Admitting: Psychiatry

## 2022-09-19 ENCOUNTER — Encounter: Payer: Self-pay | Admitting: Psychiatry

## 2022-09-19 VITALS — BP 94/65 | HR 101 | Temp 97.7°F | Ht 64.0 in | Wt 322.8 lb

## 2022-09-19 DIAGNOSIS — F424 Excoriation (skin-picking) disorder: Secondary | ICD-10-CM | POA: Diagnosis not present

## 2022-09-19 DIAGNOSIS — F4310 Post-traumatic stress disorder, unspecified: Secondary | ICD-10-CM | POA: Diagnosis not present

## 2022-09-19 DIAGNOSIS — Z8659 Personal history of other mental and behavioral disorders: Secondary | ICD-10-CM | POA: Diagnosis not present

## 2022-09-19 DIAGNOSIS — F1011 Alcohol abuse, in remission: Secondary | ICD-10-CM

## 2022-09-19 DIAGNOSIS — F411 Generalized anxiety disorder: Secondary | ICD-10-CM

## 2022-09-19 NOTE — Progress Notes (Unsigned)
BH MD OP Progress Note  09/19/2022 4:03 PM Barbara Pennington  MRN:  [id number]  Chief Complaint:  Chief Complaint  Patient presents with   Follow-up   Depression   Anxiety   Medication Refill   HPI: Barbara Pennington is a 42 year old Caucasian female, divorced, currently lives in [place], has a history of PTSD, anxiety, skin picking disorder, borderline personality disorder, morbid obesity, OSA on CPAP, hypertension/chronic kidney disease, asthma, chronic back pain, right sided knee injury was evaluated in office today.  Patient today reports overall she is currently doing well with regards to her mood symptoms.  She reports skin picking has improved.  She reports she does pick the back of her thighs, however has been coping with it better than before.  She has started a few hobbies like cross stitching which helps to keep her hands busy.  She also reports she got her nails done and that does prevent her from doing too much picking and that is protective as well.  Patient reports she currently struggle with sleep since she has a cough.  It is day 2 of her cough symptoms and she has been self-medicating herself with over-the-counter medications.  She reports if it does not get better she will reach out to her primary care provider.  The cough does affect her sleep.  When she does not sleep too well she does feel tired during the day.  She has been more compliant with her CPAP and that does help.  Patient reports she was recently started on Sunosi to help with daytime energy and sleepiness.  Patient is currently on Wellbutrin 150 mg.  Provided education about drug to drug interaction between Wellbutrin and Sunosi.  If patient tolerates the Sunosi well and plans to stay on it, will consider tapering the Wellbutrin off gradually.  Patient agrees to let this provider know.  Patient is currently compliant on the Lexapro and BuSpar.  Patient denies any suicidality, homicidality or perceptual  disturbances.  She reports work is going well.  She currently does not have a therapist.  She denies any other concerns today.  Visit Diagnosis:    ICD-10-CM   1. PTSD (post-traumatic stress disorder)  F43.10     2. GAD (generalized anxiety disorder)  F41.1     3. 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 History of borderline personality disorder  Z86.59     5. History of alcohol abuse  F10.11       Past Psychiatric History: I have reviewed past psychiatric history from progress note on 05/05/2021.  Patient was previously under the care of [facility].  Past Medical History:  Past Medical History:  Diagnosis Date   Anxiety    Arthritis 2006   rhuematoid - mild - no current issues   Asthma 2003   Borderline personality disorder (HCC)    Chronic kidney disease    stage 3   Complication of anesthesia    "Usually" has low temp after surgery.After hysterectomy temp was 94   Depression    GERD (gastroesophageal reflux disease)    History of kidney stones    Hyperlipidemia    Hypertension 2013   Lump or mass in breast 2013   RIGHT BREAST   Motion sickness    repeated amusement park rides   PTSD (post-traumatic stress disorder)    Scoliosis    no current issues   Shortness of breath dyspnea    secondary to weight    Sleep apnea  uses cpap   Ulcer 2001    Past Surgical History:  Procedure Laterality Date   ABDOMINAL HYSTERECTOMY  01/08/2006   BREAST BIOPSY Left 01/09/2011   CORE - FIBROADENOMA VS PHYLLODES   CESAREAN SECTION  01/08/2005   COLONOSCOPY WITH PROPOFOL N/A 10/09/2016   Procedure: COLONOSCOPY WITH PROPOFOL;  Surgeon: Wyline Mood, MD;  Location: [facility] ENDOSCOPY;  Service: Gastroenterology;  Laterality: N/A;   COLONOSCOPY WITH PROPOFOL N/A 11/09/2019   Procedure: COLONOSCOPY WITH PROPOFOL;  Surgeon: Toney Reil, MD;  Location: [facility] ENDOSCOPY;  Service: Gastroenterology;  Laterality: N/A;   ESOPHAGOGASTRODUODENOSCOPY     GUM SURGERY  01/08/2002   KNEE  ARTHROSCOPY WITH MEDIAL MENISECTOMY Right 07/24/2021   Procedure: Right medial meniscus root repair and patella chondroplasty;  Surgeon: Signa Kell, MD;  Location: [facility] ORS;  Service: Orthopedics;  Laterality: Right;   PLANTAR FASCIA RELEASE Left 08/19/2014   Procedure: ENDOSCOPIC PLANTAR FASCIOTOMY RELEASE L WITH TOPAZ;  Surgeon: Recardo Evangelist, DPM;  Location: [facility];  Service: Podiatry;  Laterality: Left;  LMA WITH POPLITEAL BLOCK   TONSILLECTOMY  01/08/1982    Family Psychiatric History: I have reviewed family psychiatric history from progress note on 05/05/2021.  Family History:  Family History  Problem Relation Age of Onset   Obesity Mother    Diabetes Mother    High Cholesterol Mother    Hypertension Mother    Neuropathy Mother    Depression Mother    Anxiety disorder Mother    Obesity Father    Hypertension Father    High Cholesterol Father    Depression Father    Anxiety disorder Father    Seizures Brother    Testicular cancer Brother    Breast cancer Maternal Grandmother    Hypertension Maternal Grandmother    Aneurysm Maternal Grandmother    Stroke Maternal Grandmother    Hypertension Maternal Grandfather    High Cholesterol Maternal Grandfather    Heart disease Maternal Grandfather        has a pacemaker   Stroke Maternal Grandfather    Alzheimer's disease Paternal Grandmother    Dementia Paternal Grandmother    Colon cancer Paternal Grandfather    Mental illness Maternal Aunt    Suicidality Cousin     Social History: I have reviewed social history from progress note on 05/05/2021. Social History   Socioeconomic History   Marital status: Divorced    Spouse name: Not on file   Number of children: 2   Years of education: 14   Highest education level: Bachelor's degree (e.g., BA, AB, BS)  Occupational History   Not on file  Tobacco Use   Smoking status: Former    Current packs/day: 0.00    Average packs/day: 0.3 packs/day for 10.0 years  (2.5 ttl pk-yrs)    Types: Cigarettes    Start date: 07/04/2004    Quit date: 07/05/2014    Years since quitting: 8.2   Smokeless tobacco: Never  Vaping Use   Vaping status: Never Used  Substance and Sexual Activity   Alcohol use: Yes    Comment: rare   Drug use: No   Sexual activity: Yes    Partners: Male    Birth control/protection: Surgical    Comment: 2007  Other Topics Concern   Not on file  Social History Narrative   Not on file   Social Determinants of Health   Financial Resource Strain: High Risk (04/23/2022)   Overall Financial Resource Strain (CARDIA)    Difficulty of  Paying Living Expenses: Hard  Food Insecurity: Food Insecurity Present (04/23/2022)   Hunger Vital Sign    Worried About Running Out of Food in the Last Year: Often true    Ran Out of Food in the Last Year: Sometimes true  Transportation Needs: No Transportation Needs (04/23/2022)   PRAPARE - Administrator, Civil Service (Medical): No    Lack of Transportation (Non-Medical): No  Physical Activity: Unknown (04/23/2022)   Exercise Vital Sign    Days of Exercise per Week: 0 days    Minutes of Exercise per Session: Not on file  Stress: Stress Concern Present (04/23/2022)   [employer] of Occupational Health - Occupational Stress Questionnaire    Feeling of Stress : Rather much  Social Connections: Socially Isolated (04/23/2022)   Social Connection and Isolation Panel [NHANES]    Frequency of Communication with Friends and Family: Twice a week    Frequency of Social Gatherings with Friends and Family: Never    Attends Religious Services: Never    Database administrator or Organizations: No    Attends Engineer, structural: Not on file    Marital Status: Divorced    Allergies:  Allergies  Allergen Reactions   Latex Rash   Metformin And Related     diarrhea    Metabolic Disorder Labs: Lab Results  Component Value Date   HGBA1C 5.5 12/15/2021   MPG 111 12/15/2021   MPG  105 12/06/2020   No results found for: "PROLACTIN" Lab Results  Component Value Date   CHOL 186 12/15/2021   TRIG 73 12/15/2021   HDL 60 12/15/2021   CHOLHDL 3.1 12/15/2021   VLDL 27 08/06/2019   LDLCALC 110 (H) 12/15/2021   LDLCALC 111 (H) 12/06/2020   Lab Results  Component Value Date   TSH 1.83 12/15/2021   TSH 2.59 04/04/2018    Therapeutic Level Labs: No results found for: "LITHIUM" No results found for: "VALPROATE" No results found for: "CBMZ"  Current Medications: Current Outpatient Medications  Medication Sig Dispense Refill   albuterol (VENTOLIN HFA) 108 (90 Base) MCG/ACT inhaler Inhale 2 puffs into the lungs every 4 (four) hours as needed for wheezing or shortness of breath. 9 g 0   amLODipine (NORVASC) 2.5 MG tablet Take 1 tablet (2.5 mg total) by mouth every morning. 90 tablet 0   atorvastatin (LIPITOR) 20 MG tablet Take 1 tablet (20 mg total) by mouth every morning. 90 tablet 1   buPROPion (WELLBUTRIN XL) 150 MG 24 hr tablet Take 1 tablet (150 mg total) by mouth daily with breakfast. Stop wellbutrin xl 300 mg 90 tablet 0   busPIRone (BUSPAR) 30 MG tablet Take 1 tablet (30 mg total) by mouth 2 (two) times daily. 180 tablet 1   desloratadine (CLARINEX) 5 MG tablet Take 1 tablet (5 mg total) by mouth daily. 30 tablet 11   escitalopram (LEXAPRO) 20 MG tablet Take 1 tablet (20 mg total) by mouth daily. 90 tablet 1   fluticasone (FLONASE) 50 MCG/ACT nasal spray Place 1 spray into both nostrils daily. 18.2 mL 6   gabapentin (NEURONTIN) 300 MG capsule Take 1 capsule by mouth 3 (three) times daily.     ipratropium-albuterol (DUONEB) 0.5-2.5 (3) MG/3ML SOLN Take 3 mLs by nebulization 3 (three) times daily as needed (asthma/bronchitis sx, cough wheeze sob). 180 mL 1   Melatonin 10 MG CAPS Take 10-20 mg by mouth at bedtime.     metaxalone (SKELAXIN) 800 MG tablet Take 1  tablet (800 mg total) by mouth 3 (three) times daily as needed for muscle spasms. 270 tablet 0   montelukast  (SINGULAIR) 10 MG tablet Take 1 tablet (10 mg total) by mouth at bedtime. 90 tablet 1   SUNOSI 75 MG TABS TAKE 1 TABLET BY MOUTH IN THE MORNING 30 tablet 0   valsartan-hydrochlorothiazide (DIOVAN-HCT) 320-12.5 MG tablet Take 1 tablet by mouth daily. 90 tablet 0   WEGOVY 2.4 MG/0.75ML SOAJ INJECT 2.4 MG UNDER THE SKIN ONCE WEEKLY 4 mL 0   No current facility-administered medications for this visit.     Musculoskeletal: Strength & Muscle Tone: within normal limits Gait & Station: normal Patient leans: N/A  Psychiatric Specialty Exam: Review of Systems  Respiratory:  Positive for cough.   Psychiatric/Behavioral:  Positive for sleep disturbance.     Blood pressure 94/65, pulse (!) 101, temperature 97.7 F (36.5 C), temperature source Skin, height 5\' 4"  (1.626 m), weight (!) 322 lb 12.8 oz (146.4 kg).Body mass index is 55.41 kg/m.  General Appearance: Casual  Eye Contact:  Fair  Speech:  Clear and Coherent  Volume:  Normal  Mood:  Euthymic  Affect:  Full Range  Thought Process:  Goal Directed and Descriptions of Associations: Intact  Orientation:  Full (Time, Place, and Person)  Thought Content: Logical   Suicidal Thoughts:  No  Homicidal Thoughts:  No  Memory:  Immediate;   Fair Recent;   Fair Remote;   Fair  Judgement:  Fair  Insight:  Fair  Psychomotor Activity:  Normal  Concentration:  Concentration: Fair and Attention Span: Fair  Recall:  [employer] of Knowledge: Fair  Language: Fair  Akathisia:  No  Handed:  Right  AIMS (if indicated): not done  Assets:  Communication Skills Desire for Improvement Housing Social Support  ADL's:  Intact  Cognition: WNL  Sleep:   Restless due to cough   Screenings: AIMS    Flowsheet Row Video Visit from 06/13/2021 in [facility] Office Visit from 05/05/2021 in [facility]  AIMS Total Score 0 0      GAD-7    Flowsheet Row Office Visit from  09/19/2022 in [facility] Counselor from 10/30/2021 in [facility] at [facility] Visit from 05/30/2021 in [facility] at [facility] Video Visit from 05/26/2021 in [facility] Office Visit from 05/05/2021 in [facility]  Total GAD-7 Score 1 21 11 7 17       PHQ2-9    Flowsheet Row Office Visit from 09/19/2022 in [facility] Office Visit from 07/31/2022 in [facility] Counselor from 07/20/2022 in [facility] at [facility] Visit from 04/24/2022 in [facility] Office Visit from 03/20/2022 in [facility]  PHQ-2 Total Score 1 0 4 1 0  PHQ-9 Total Score -- 6 17 7 6       Flowsheet Row Office Visit from 09/19/2022 in [facility] Most recent reading at 09/19/2022  3:54 PM Video Visit from 07/19/2022 in [facility] Most recent reading at 07/20/2022  1:14 PM Counselor from 07/20/2022 in [facility] Health at [place] Most recent reading at 07/20/2022 11:04 AM  C-SSRS RISK CATEGORY No Risk Low Risk Low Risk  Assessment and Plan: Barbara Pennington is a 42 year old Caucasian female who has a history of PTSD, borderline personality disorder, depression was evaluated in office today.  Patient is currently improving with regards to mood symptoms, skin picking although sleep is affected due to current cough, she will benefit from the following plan.  Plan PTSD-improving Lexapro 20 mg p.o. daily Continue Wellbutrin XL 150 mg p.o. daily in the morning-reduced dosage.  Discussed with patient drug to drug interaction between Wellbutrin and Sunosi.  Will consider tapering of Wellbutrin in  the future.  Patient to let this provider know in a few weeks if she is tolerating the Sunosi well, then we could reduce the dosage of Wellbutrin. Patient to continue CBT-patient encouraged to establish care with a new therapist, since previous therapist left the practice.  GAD-improving BuSpar 30 mg p.o. twice daily Lexapro 20 mg p.o. daily Gabapentin 300 mg 3 times daily-prescribed by primary care provider.  Skin picking disorder-improving Continue BuSpar 30 mg p.o. twice daily Lexapro 20 mg p.o. daily Continue CBT  History of borderline personality disorder-continue DBT/CBT  History of alcohol abuse-patient has been sober since 2017.  Collaboration of Care: Collaboration of Care: Referral or follow-up with counselor/therapist AEB patient encouraged to establish care with a therapist.  Patient also advised to reach out to primary care provider if her cough does not get better.  Patient/Guardian was advised Release of Information must be obtained prior to any record release in order to collaborate their care with an outside provider. Patient/Guardian was advised if they have not already done so to contact the registration department to sign all necessary forms in order for [name] to release information regarding their care.   Consent: Patient/Guardian gives verbal consent for treatment and assignment of benefits for services provided during this visit. Patient/Guardian expressed understanding and agreed to proceed.   Follow-up in clinic in 3 months or sooner if needed.  This note was generated in part or whole with voice recognition software. Voice recognition is usually quite accurate but there are transcription errors that can and very often do occur. I apologize for any typographical errors that were not detected and corrected.    Jomarie Longs, MD 09/20/2022, 1:36 PM

## 2022-09-20 ENCOUNTER — Ambulatory Visit: Payer: Self-pay | Admitting: *Deleted

## 2022-09-20 ENCOUNTER — Encounter: Payer: Self-pay | Admitting: Family Medicine

## 2022-09-20 NOTE — Progress Notes (Signed)
Name: Barbara Pennington   MRN: [id number]    DOB: 18-Feb-1980   Date:09/21/2022       Progress Note  Subjective  Chief Complaint  Chief Complaint  Patient presents with   Covid Positive    Runny nose, headache, fatigue, cough, muscle pain for 4 days    I connected with  Shyne L Acord  on 09/21/22 at  8:20 AM EDT by a video enabled telemedicine application and verified that I am speaking with the correct person using two identifiers.  I discussed the limitations of evaluation and management by telemedicine and the availability of in person appointments. The patient expressed understanding and agreed to proceed with a virtual visit  Staff also discussed with the patient that there may be a patient responsible charge related to this service. Patient Location: home Provider Location: [facility] Additional Individuals present: alone  HPI   Covid positive: symptoms started 4 days ago, reports she tested positive for covid yesterday at work -Fever: no -Cough: yes -Shortness of breath: yes, some shortness of breath, no respiratory distress -Wheezing: no -Chest congestion: yes -Nasal congestion: yes -Runny nose: yes -Post nasal drip: yes -Sore throat: yes -Sinus pressure: yes -Headache: yes -Face pain: no -Ear pain:  no -Ear pressure: no Body aches: yes Fatigue: yes -Relief with OTC cold/cough medications: ibuprofen, tylenol, cough syrup, delsym, zyrtec and flonase  Recommend taking zyrtec, flonase, mucinex, vitamin d, vitamin c, and zinc. Push fluids and get rest.    Can gargle with salt water, drink hot tea with honey, use throat lozenges.   Will send in phenergan DM and tessalon perls Discussed antiviral treatment. Discussed side effects and administration. Denies possibility of pregnancy, hysterectomy.  Last GFR was 59, will give renal dose.  Patient Active Problem List   Diagnosis Date Noted   Chronic cough 02/13/2022   Skin-picking disorder 12/19/2021   Adjustment disorder with  depressed mood 06/13/2021   Closed nondisplaced longitudinal fracture of right patella 06/09/2021   GAD (generalized anxiety disorder) 05/05/2021   Depressive disorder 05/05/2021   History of borderline personality disorder 05/05/2021   History of alcohol abuse 05/05/2021   Internal derangement of left knee 04/03/2021   Primary osteoarthritis of left knee 04/03/2021   Metabolic syndrome 03/24/2021   Impaired fasting glucose 12/06/2020   Lymphedema 12/06/2020   Moderate episode of recurrent major depressive disorder (HCC) 02/08/2020   CKD (chronic kidney disease) stage 3, GFR 30-59 ml/min (HCC) 06/11/2019   PTSD (post-traumatic stress disorder) 01/14/2019   Borderline personality disorder (HCC) 01/14/2019   Anxiety 01/14/2019   Mixed hyperlipidemia 01/14/2019   Morbid obesity with BMI of 50.0-59.9, adult (HCC) 01/14/2019   OSA on CPAP 01/14/2019   Asthma, mild persistent 04/30/2018   Low back pain 08/20/2017   Fibroadenoma of breast, 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   Essential hypertension     Social History   Tobacco Use   Smoking status: Former    Current packs/day: 0.00    Average packs/day: 0.3 packs/day for 10.0 years (2.5 ttl pk-yrs)    Types: Cigarettes    Start date: 07/04/2004    Quit date: 07/05/2014    Years since quitting: 8.2   Smokeless tobacco: Never  Substance Use Topics   Alcohol use: Yes    Comment: rare     Current Outpatient Medications:    albuterol (VENTOLIN HFA) 108 (90 Base) MCG/ACT inhaler, Inhale 2 puffs into the lungs every 4 (four) hours as needed for wheezing or shortness of breath., Disp: 9  g, Rfl: 0   amLODipine (NORVASC) 2.5 MG tablet, Take 1 tablet (2.5 mg total) by mouth every morning., Disp: 90 tablet, Rfl: 0   atorvastatin (LIPITOR) 20 MG tablet, Take 1 tablet (20 mg total) by mouth every morning., Disp: 90 tablet, Rfl: 1   benzonatate (TESSALON) 100 MG capsule, Take 2 capsules (200 mg total) by mouth 2 (two) times daily as needed for cough., Disp: 20  capsule, Rfl: 0   buPROPion (WELLBUTRIN XL) 150 MG 24 hr tablet, Take 1 tablet (150 mg total) by mouth daily with breakfast. Stop wellbutrin xl 300 mg, Disp: 90 tablet, Rfl: 0   busPIRone (BUSPAR) 30 MG tablet, Take 1 tablet (30 mg total) by mouth 2 (two) times daily., Disp: 180 tablet, Rfl: 1   desloratadine (CLARINEX) 5 MG tablet, Take 1 tablet (5 mg total) by mouth daily., Disp: 30 tablet, Rfl: 11   escitalopram (LEXAPRO) 20 MG tablet, Take 1 tablet (20 mg total) by mouth daily., Disp: 90 tablet, Rfl: 1   fluticasone (FLONASE) 50 MCG/ACT nasal spray, Place 1 spray into both nostrils daily., Disp: 18.2 mL, Rfl: 6   gabapentin (NEURONTIN) 300 MG capsule, Take 1 capsule by mouth 3 (three) times daily., Disp: , Rfl:    ipratropium-albuterol (DUONEB) 0.5-2.5 (3) MG/3ML SOLN, Take 3 mLs by nebulization 3 (three) times daily as needed (asthma/bronchitis sx, cough wheeze sob)., Disp: 180 mL, Rfl: 1   Melatonin 10 MG CAPS, Take 10-20 mg by mouth at bedtime., Disp: , Rfl:    metaxalone (SKELAXIN) 800 MG tablet, Take 1 tablet (800 mg total) by mouth 3 (three) times daily as needed for muscle spasms., Disp: 270 tablet, Rfl: 0   montelukast (SINGULAIR) 10 MG tablet, Take 1 tablet (10 mg total) by mouth at bedtime., Disp: 90 tablet, Rfl: 1   nirmatrelvir/ritonavir, renal dosing, (PAXLOVID) 10 x 150 MG & 10 x 100MG  TABS, Take 2 tablets by mouth 2 (two) times daily for 5 days. (Take nirmatrelvir 150 mg one tablet twice daily for 5 days and ritonavir 100 mg one tablet twice daily for 5 days) Patient GFR is 59, Disp: 20 tablet, Rfl: 0   promethazine-dextromethorphan (PROMETHAZINE-DM) 6.25-15 MG/5ML syrup, Take 5 mLs by mouth 4 (four) times daily as needed for cough., Disp: 118 mL, Rfl: 0   SUNOSI 75 MG TABS, TAKE 1 TABLET BY MOUTH IN THE MORNING, Disp: 30 tablet, Rfl: 0   valsartan-hydrochlorothiazide (DIOVAN-HCT) 320-12.5 MG tablet, Take 1 tablet by mouth daily., Disp: 90 tablet, Rfl: 0   WEGOVY 2.4 MG/0.75ML SOAJ,  INJECT 2.4 MG UNDER THE SKIN ONCE WEEKLY, Disp: 4 mL, Rfl: 0  Allergies  Allergen Reactions   Latex Rash   Metformin And Related     diarrhea    I personally reviewed active problem list, medication list, allergies with the patient/caregiver today.  ROS  Ten systems reviewed and is negative except as mentioned in HPI   Objective  Virtual encounter, vitals not obtained.  Body mass index is 60.59 kg/m.  Nursing Note and Vital Signs reviewed.  Physical Exam  Awake, alert and oriented, speaking in complete sentences  No results found for this or any previous visit (from the past 72 hour(s)).  Assessment & Plan  1. COVID-19 Recommend taking zyrtec, flonase, mucinex, vitamin d, vitamin c, and zinc. Push fluids and get rest.    Can gargle with salt water, drink hot tea with honey, use throat lozenges.   Will send in phenergan DM and tessalon perls Discussed antiviral  treatment. Discussed side effects and administration. Denies possibility of pregnancy, hysterectomy.  Last GFR was 59, will give renal dose.  - nirmatrelvir/ritonavir, renal dosing, (PAXLOVID) 10 x 150 MG & 10 x 100MG  TABS; Take 2 tablets by mouth 2 (two) times daily for 5 days. (Take nirmatrelvir 150 mg one tablet twice daily for 5 days and ritonavir 100 mg one tablet twice daily for 5 days) Patient GFR is 59  Dispense: 20 tablet; Refill: 0 - benzonatate (TESSALON) 100 MG capsule; Take 2 capsules (200 mg total) by mouth 2 (two) times daily as needed for cough.  Dispense: 20 capsule; Refill: 0 - promethazine-dextromethorphan (PROMETHAZINE-DM) 6.25-15 MG/5ML syrup; Take 5 mLs by mouth 4 (four) times daily as needed for cough.  Dispense: 118 mL; Refill: 0   -Red flags and when to present for emergency care or RTC including fever >101.30F, chest pain, shortness of breath, new/worsening/un-resolving symptoms,  reviewed with patient at time of visit. Follow up and care instructions discussed and provided in AVS. - I  discussed the assessment and treatment plan with the patient. The patient was provided an opportunity to ask questions and all were answered. The patient agreed with the plan and demonstrated an understanding of the instructions.  I provided 15 minutes of non-face-to-face time during this encounter.  Berniece Salines, FNP

## 2022-09-20 NOTE — Telephone Encounter (Signed)
  Chief Complaint: Covid Symptoms: Tested positive at work place today. Headache, sore throat, body aches, fatigue, sinus drainage, chest tightness Frequency: Tuesday, symptoms onset Pertinent Negatives: Patient denies wheezing, SOB Disposition: [] ED /[] Urgent Care (no appt availability in office) / [x] Appointment(In office/virtual)/ []  [facility]/ [] Home Care/ [] Refused Recommended Disposition /[] [facility]/ []  Follow-up with PCP Additional Notes: Pt is interested in oral anti-viral med. Secured VV or tomorrow AM, would like to be called today if possible. Care advise provided,self isolation guidelines reviewed. Pt verbalizes understanding.  Reason for Disposition  [1] HIGH RISK patient (e.g., weak immune system, age > 64 years, obesity with BMI 30 or higher, pregnant, chronic lung disease or other chronic medical condition) AND [2] COVID symptoms (e.g., cough, fever)  (Exceptions: Already seen by PCP and no new or worsening symptoms.)  Answer Assessment - Initial Assessment Questions 1. COVID-19 DIAGNOSIS: "How do you know that you have COVID?" (e.g., positive lab test or self-test, diagnosed by doctor or NP/PA, symptoms after exposure).     Workplace test 2. COVID-19 EXPOSURE: "Was there any known exposure to COVID before the symptoms began?" CDC Definition of close contact: within 6 feet (2 meters) for a total of 15 minutes or more over a 24-hour period.      No 3. ONSET: "When did the COVID-19 symptoms start?"      Tuesday 4. WORST SYMPTOM: "What is your worst symptom?" (e.g., cough, fever, shortness of breath, muscle aches)     Fatigued 5. COUGH: "Do you have a cough?" If Yes, ask: "How bad is the cough?"       Chest tightness, cough, headache, nasal drainage, dizziness, fatigued 6. FEVER: "Do you have a fever?" If Yes, ask: "What is your temperature, how was it measured, and when did it start?"     LGT  possible 7. RESPIRATORY STATUS: "Describe your breathing?"  (e.g., normal; shortness of breath, wheezing, unable to speak)      Hard to take deep breath, "So tired." 8. BETTER-SAME-WORSE: "Are you getting better, staying the same or getting worse compared to yesterday?"  If getting worse, ask, "In what way?"     worse 9. OTHER SYMPTOMS: "Do you have any other symptoms?"  (e.g., chills, fatigue, headache, loss of smell or taste, muscle pain, sore throat)     Yes fatigue, aches, headache, sore throat 10. HIGH RISK DISEASE: "Do you have any chronic medical problems?" (e.g., asthma, heart or lung disease, weak immune system, obesity, etc.)       Yes 11. VACCINE: "Have you had the COVID-19 vaccine?" If Yes, ask: "Which one, how many shots, when did you get it?"       Yes, 2023  Protocols used: Coronavirus (COVID-19) Diagnosed or Suspected-A-AH

## 2022-09-21 ENCOUNTER — Telehealth (INDEPENDENT_AMBULATORY_CARE_PROVIDER_SITE_OTHER): Payer: 59 | Admitting: Nurse Practitioner

## 2022-09-21 ENCOUNTER — Other Ambulatory Visit: Payer: Self-pay

## 2022-09-21 ENCOUNTER — Encounter: Payer: Self-pay | Admitting: Nurse Practitioner

## 2022-09-21 VITALS — Temp 99.3°F | Ht 64.0 in | Wt 323.0 lb

## 2022-09-21 DIAGNOSIS — U071 COVID-19: Secondary | ICD-10-CM

## 2022-09-21 MED ORDER — PROMETHAZINE-DM 6.25-15 MG/5ML PO SYRP
5.0000 mL | ORAL_SOLUTION | Freq: Four times a day (QID) | ORAL | 0 refills | Status: DC | PRN
Start: 2022-09-21 — End: 2022-10-30

## 2022-09-21 MED ORDER — NIRMATRELVIR/RITONAVIR (PAXLOVID) TABLET (RENAL DOSING)
2.0000 | ORAL_TABLET | Freq: Two times a day (BID) | ORAL | 0 refills | Status: AC
Start: 2022-09-21 — End: 2022-09-26

## 2022-09-21 MED ORDER — BENZONATATE 100 MG PO CAPS
200.0000 mg | ORAL_CAPSULE | Freq: Two times a day (BID) | ORAL | 0 refills | Status: DC | PRN
Start: 2022-09-21 — End: 2022-10-30

## 2022-10-07 ENCOUNTER — Other Ambulatory Visit: Payer: Self-pay | Admitting: Family Medicine

## 2022-10-17 ENCOUNTER — Other Ambulatory Visit: Payer: Self-pay | Admitting: Family Medicine

## 2022-10-17 ENCOUNTER — Other Ambulatory Visit: Payer: Self-pay | Admitting: Psychiatry

## 2022-10-17 DIAGNOSIS — G4733 Obstructive sleep apnea (adult) (pediatric): Secondary | ICD-10-CM

## 2022-10-17 DIAGNOSIS — F4310 Post-traumatic stress disorder, unspecified: Secondary | ICD-10-CM

## 2022-10-17 DIAGNOSIS — F411 Generalized anxiety disorder: Secondary | ICD-10-CM

## 2022-10-17 DIAGNOSIS — Z8659 Personal history of other mental and behavioral disorders: Secondary | ICD-10-CM

## 2022-10-24 ENCOUNTER — Other Ambulatory Visit: Payer: Self-pay | Admitting: Psychiatry

## 2022-10-24 ENCOUNTER — Other Ambulatory Visit: Payer: Self-pay | Admitting: Family Medicine

## 2022-10-24 DIAGNOSIS — Z8659 Personal history of other mental and behavioral disorders: Secondary | ICD-10-CM

## 2022-10-24 DIAGNOSIS — F411 Generalized anxiety disorder: Secondary | ICD-10-CM

## 2022-10-24 DIAGNOSIS — F4310 Post-traumatic stress disorder, unspecified: Secondary | ICD-10-CM

## 2022-10-25 ENCOUNTER — Other Ambulatory Visit: Payer: Self-pay | Admitting: Family Medicine

## 2022-10-29 NOTE — Progress Notes (Unsigned)
Name: Barbara Pennington   MRN: [id number]    DOB: 1980-03-13   Date:10/30/2022       Progress Note  Subjective  Chief Complaint  Follow Up  HPI  Morbid Obesity: her weight was 180 lbs when she graduated HS, when she graduated college/after birth of her first child her weight was up 256 lbs (January 2004 after his birth), she lost weight between 2009-2010 due to severe depression down to around 180 lbs, she gradually started to gain weight again and stay around 220 lbs around 216 lbs, but had plantar fascitis and had surgery so she gained weight after that. In 2019 she injury her back at work and lack of activity also caused more weight gain. Her heaviest weight was in April 2024  at 357 lbs. She started  [facility] consistently since March and weight is down to 325 lbs, she has lost over 5 % and maintaining  is not curbing her cravings as much.She is cutting down on portions, trying to avoid a lot of carbohydrates, eating more fresh fruit and vegetables and is now going to the gym   She is morbidly obese with OSA, OA, GERD, CKI, glucose intolerance and dyslipidemia   She has tried [employer] and [employer]chers ,Doylene Bode and [employer]   Metabolic syndrome with obesity: responded well to GLP-1 in the past for pre-diabetes. She is taking [place]   HTN/ Chronic kidney disease:She states she is following up with Nephrology for monitoring. She is compliant with medication, taking norvasc 2.5 mg and valsartan hctz 320/12.5 mg. BP has been at goal   History of right knee surgery: done 07/17, had right meniscal repair and patella chondroplasty. She is seeing Dr. Signa Kell, now she has more left knee pain, she had some injections and has a follow up tomorrow    Chronic back pain: she states standing causes increase of pain, even sitting has to shift positions frequently. She had a stand up desk, but was given to another employee. Aching pain, worse on right lower back, no radiculitis. She states she is  doing better , no longer taking muscle relaxer . She has been doing more stretching at home  CKI : stage III, advised to stay off nsaid's. She has good urine output , no pruritus last GFR 59, she is taking ARB and losing weight . We will recheck labs yearly   Depression/GAD/personality disorder: under the care of Dr. Elna Breslow, who she sees about every 3 months, she also has a therapist who she sees at least once a month, she is  compliant with medications and states feeling well this week, phq 9 today is negative . Continue current regiment. She states her therapist is leaving soon   Asthma mild intermittent/AR she saw pulmonologist for increase in SOB and had echo , PFT and esophageal test, all normal , she is using nasal spray. She needs refills of medications   OSA: she has been using CPAP she states wears it every night but still has severe daytime somnolence and sometimes dozes off while driving. We gave her modafinil one month ago, she did not take it today, states initially it helped with alertness, now on Sunosi and has improved the afternoon lethargy  Patient Active Problem List   Diagnosis Date Noted   Chronic cough 02/13/2022   Skin-picking disorder 12/19/2021   Adjustment disorder with depressed mood 06/13/2021   Closed nondisplaced longitudinal fracture of right patella 06/09/2021   GAD (generalized anxiety disorder) 05/05/2021  Depressive disorder 05/05/2021   History of borderline personality disorder 05/05/2021   History of alcohol abuse 05/05/2021   Internal derangement of left knee 04/03/2021   Primary osteoarthritis of left knee 04/03/2021   Metabolic syndrome 03/24/2021   Impaired fasting glucose 12/06/2020   Lymphedema 12/06/2020   Moderate episode of recurrent major depressive disorder (HCC) 02/08/2020   CKD (chronic kidney disease) stage 3, GFR 30-59 ml/min (HCC) 06/11/2019   PTSD (post-traumatic stress disorder) 01/14/2019   Borderline personality disorder (HCC)  01/14/2019   Anxiety 01/14/2019   Mixed hyperlipidemia 01/14/2019   Morbid obesity with BMI of 50.0-59.9, adult (HCC) 01/14/2019   OSA on CPAP 01/14/2019   Asthma, mild persistent 04/30/2018   Low back pain 08/20/2017   Fibroadenoma of breast, 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   Essential hypertension     Past Surgical History:  Procedure Laterality Date   ABDOMINAL HYSTERECTOMY  01/08/2006   BREAST BIOPSY Left 01/09/2011   CORE - FIBROADENOMA VS PHYLLODES   CESAREAN SECTION  01/08/2005   COLONOSCOPY WITH PROPOFOL N/A 10/09/2016   Procedure: COLONOSCOPY WITH PROPOFOL;  Surgeon: Wyline Mood, MD;  Location: [facility] ENDOSCOPY;  Service: Gastroenterology;  Laterality: N/A;   COLONOSCOPY WITH PROPOFOL N/A 11/09/2019   Procedure: COLONOSCOPY WITH PROPOFOL;  Surgeon: Toney Reil, MD;  Location: [facility] ENDOSCOPY;  Service: Gastroenterology;  Laterality: N/A;   ESOPHAGOGASTRODUODENOSCOPY     GUM SURGERY  01/08/2002   KNEE ARTHROSCOPY WITH MEDIAL MENISECTOMY Right 07/24/2021   Procedure: Right medial meniscus root repair and patella chondroplasty;  Surgeon: Signa Kell, MD;  Location: [facility] ORS;  Service: Orthopedics;  Laterality: Right;   PLANTAR FASCIA RELEASE Left 08/19/2014   Procedure: ENDOSCOPIC PLANTAR FASCIOTOMY RELEASE L WITH TOPAZ;  Surgeon: Recardo Evangelist, DPM;  Location[facility];  Service: Podiatry;  Laterality: Left;  LMA WITH POPLITEAL BLOCK   TONSILLECTOMY  01/08/1982    Family History  Problem Relation Age of Onset   Obesity Mother    Diabetes Mother    High Cholesterol Mother    Hypertension Mother    Neuropathy Mother    Depression Mother    Anxiety disorder Mother    Obesity Father    Hypertension Father    High Cholesterol Father    Depression Father    Anxiety disorder Father    Seizures Brother    Testicular cancer Brother    Breast cancer Maternal Grandmother    Hypertension Maternal Grandmother    Aneurysm Maternal Grandmother    Stroke Maternal Grandmother     Hypertension Maternal Grandfather    High Cholesterol Maternal Grandfather    Heart disease Maternal Grandfather        has a pacemaker   Stroke Maternal Grandfather    Alzheimer's disease Paternal Grandmother    Dementia Paternal Grandmother    Colon cancer Paternal Grandfather    Mental illness Maternal Aunt    Suicidality Cousin     Social History   Tobacco Use   Smoking status: Former    Current packs/day: 0.00    Average packs/day: 0.3 packs/day for 10.0 years (2.5 ttl pk-yrs)    Types: Cigarettes    Start date: 07/04/2004    Quit date: 07/05/2014    Years since quitting: 8.3   Smokeless tobacco: Never  Substance Use Topics   Alcohol use: Yes    Comment: rare     Current Outpatient Medications:    albuterol (VENTOLIN HFA) 108 (90 Base) MCG/ACT inhaler, Inhale 2 puffs into the lungs every 4 (  four) hours as needed for wheezing or shortness of breath., Disp: 9 g, Rfl: 0   amLODipine (NORVASC) 2.5 MG tablet, Take 1 tablet (2.5 mg total) by mouth every morning., Disp: 90 tablet, Rfl: 0   atorvastatin (LIPITOR) 20 MG tablet, Take 1 tablet (20 mg total) by mouth every morning., Disp: 90 tablet, Rfl: 1   buPROPion (WELLBUTRIN XL) 150 MG 24 hr tablet, Take 1 tablet (150 mg total) by mouth daily with breakfast. Stop wellbutrin xl 300 mg, Disp: 90 tablet, Rfl: 0   busPIRone (BUSPAR) 30 MG tablet, Take 1 tablet (30 mg total) by mouth 2 (two) times daily., Disp: 180 tablet, Rfl: 1   desloratadine (CLARINEX) 5 MG tablet, Take 1 tablet (5 mg total) by mouth daily., Disp: 30 tablet, Rfl: 11   escitalopram (LEXAPRO) 20 MG tablet, Take 1 tablet (20 mg total) by mouth daily., Disp: 90 tablet, Rfl: 1   fluticasone (FLONASE) 50 MCG/ACT nasal spray, Place 1 spray into both nostrils daily., Disp: 18.2 mL, Rfl: 6   ipratropium-albuterol (DUONEB) 0.5-2.5 (3) MG/3ML SOLN, Take 3 mLs by nebulization 3 (three) times daily as needed (asthma/bronchitis sx, cough wheeze sob)., Disp: 180 mL, Rfl: 1    Melatonin 10 MG CAPS, Take 10-20 mg by mouth at bedtime., Disp: , Rfl:    metaxalone (SKELAXIN) 800 MG tablet, Take 1 tablet (800 mg total) by mouth 3 (three) times daily as needed for muscle spasms., Disp: 270 tablet, Rfl: 0   montelukast (SINGULAIR) 10 MG tablet, Take 1 tablet (10 mg total) by mouth at bedtime., Disp: 90 tablet, Rfl: 1   SUNOSI 75 MG TABS, TAKE 1 TABLET BY MOUTH IN THE MORNING, Disp: 30 tablet, Rfl: 0   valsartan-hydrochlorothiazide (DIOVAN-HCT) 320-12.5 MG tablet, Take 1 tablet by mouth daily., Disp: 90 tablet, Rfl: 0   WEGOVY 2.4 MG/0.75ML SOAJ, INJECT 2.4MG  SUBCUTANEOUSLY ONCE A WEEK, Disp: 4 mL, Rfl: 0   gabapentin (NEURONTIN) 300 MG capsule, Take 1 capsule by mouth 3 (three) times daily., Disp: , Rfl:   Allergies  Allergen Reactions   Latex Rash   Metformin And Related     diarrhea    I personally reviewed active problem list, medication list, allergies, family history, social history, health maintenance with the patient/caregiver today.   ROS  Ten systems reviewed and is negative except as mentioned in HPI    Objective  Vitals:   10/30/22 1531  BP: 124/70  Pulse: 89  Resp: 16  SpO2: 99%  Weight: (!) 325 lb (147.4 kg)  Height: 5\' 5"  (1.651 m)    Body mass index is 54.08 kg/m.  Physical Exam  Constitutional: Patient appears well-developed and well-nourished. Obese  No distress.  HEENT: head atraumatic, normocephalic, pupils equal and reactive to light, neck supple Cardiovascular: Normal rate, regular rhythm and normal heart sounds.  No murmur heard. Non pitting  BLE edema. Pulmonary/Chest: Effort normal and breath sounds normal. No respiratory distress. Abdominal: Soft.  There is no tenderness. Psychiatric: Patient has a normal mood and affect. behavior is normal. Judgment and thought content normal.   PHQ2/9:    10/30/2022    3:30 PM 09/21/2022    7:50 AM 09/19/2022    3:54 PM 07/31/2022    7:42 AM 07/20/2022   11:03 AM  Depression screen PHQ  2/9  Decreased Interest 0 0  0   Down, Depressed, Hopeless 0 0  0   PHQ - 2 Score 0 0  0   Altered sleeping 3  3   Tired, decreased energy 3   3   Change in appetite 0   0   Feeling bad or failure about yourself  0   0   Trouble concentrating 2   0   Moving slowly or fidgety/restless 0   0   Suicidal thoughts 0   0   PHQ-9 Score 8   6   Difficult doing work/chores          Information is confidential and restricted. Go to Review Flowsheets to unlock data.    phq 9 is negative   Fall Risk:    10/30/2022    3:30 PM 09/21/2022    7:49 AM 07/31/2022    7:41 AM 04/24/2022    9:26 AM 03/20/2022    7:42 AM  Fall Risk   Falls in the past year? 1 0 1 1 1   Number falls in past yr: 0 0 1 1 1   Injury with Fall? 0  0 1 1  Risk for fall due to : No Fall Risks No Fall Risks No Fall Risks No Fall Risks No Fall Risks  Follow up Falls prevention discussed  Falls prevention discussed Falls prevention discussed Falls prevention discussed      Functional Status Survey: Is the patient deaf or have difficulty hearing?: No Does the patient have difficulty seeing, even when wearing glasses/contacts?: No Does the patient have difficulty concentrating, remembering, or making decisions?: Yes Does the patient have difficulty walking or climbing stairs?: Yes Does the patient have difficulty dressing or bathing?: No Does the patient have difficulty doing errands alone such as visiting a doctor's office or shopping?: No    Assessment & Plan  1. Morbid obesity with BMI of 50.0-59.9, adult (HCC)  - Semaglutide-Weight Management (WEGOVY) 2.4 MG/0.75ML SOAJ; Inject 2.4 mg into the skin once a week.  Dispense: 18 mL; Refill: 0  2. Stage 3a chronic kidney disease (HCC)  - valsartan-hydrochlorothiazide (DIOVAN-HCT) 320-12.5 MG tablet; Take 1 tablet by mouth daily.  Dispense: 90 tablet; Refill: 0 - amLODipine (NORVASC) 2.5 MG tablet; Take 1 tablet (2.5 mg total) by mouth every morning.  Dispense: 90  tablet; Refill: 1  3. Need for immunization against influenza  - Flu vaccine trivalent PF, 6mos and older(Flulaval,Afluria,Fluarix,Fluzone)  4. Essential hypertension  - valsartan-hydrochlorothiazide (DIOVAN-HCT) 320-12.5 MG tablet; Take 1 tablet by mouth daily.  Dispense: 90 tablet; Refill: 0 - amLODipine (NORVASC) 2.5 MG tablet; Take 1 tablet (2.5 mg total) by mouth every morning.  Dispense: 90 tablet; Refill: 1  5. OSA on CPAP  - Solriamfetol HCl (SUNOSI) 75 MG TABS; Take 1 tablet (75 mg total) by mouth every morning.  Dispense: 90 tablet; Refill: 0  6. Mild persistent asthma without complication  - montelukast (SINGULAIR) 10 MG tablet; Take 1 tablet (10 mg total) by mouth at bedtime.  Dispense: 90 tablet; Refill: 1  7. Mixed hyperlipidemia  - atorvastatin (LIPITOR) 20 MG tablet; Take 1 tablet (20 mg total) by mouth every morning.  Dispense: 90 tablet; Refill: 1  8. Perennial allergic rhinitis  - desloratadine (CLARINEX) 5 MG tablet; Take 1 tablet (5 mg total) by mouth daily.  Dispense: 90 tablet; Refill: 1 - fluticasone (FLONASE) 50 MCG/ACT nasal spray; Place 1 spray into both nostrils daily.  Dispense: 48 mL; Refill: 1

## 2022-10-30 ENCOUNTER — Ambulatory Visit (INDEPENDENT_AMBULATORY_CARE_PROVIDER_SITE_OTHER): Payer: 59 | Admitting: Family Medicine

## 2022-10-30 ENCOUNTER — Encounter: Payer: Self-pay | Admitting: Family Medicine

## 2022-10-30 VITALS — BP 124/70 | HR 89 | Resp 16 | Ht 65.0 in | Wt 325.0 lb

## 2022-10-30 DIAGNOSIS — J3089 Other allergic rhinitis: Secondary | ICD-10-CM

## 2022-10-30 DIAGNOSIS — G4733 Obstructive sleep apnea (adult) (pediatric): Secondary | ICD-10-CM

## 2022-10-30 DIAGNOSIS — N1831 Chronic kidney disease, stage 3a: Secondary | ICD-10-CM

## 2022-10-30 DIAGNOSIS — Z6841 Body Mass Index (BMI) 40.0 and over, adult: Secondary | ICD-10-CM

## 2022-10-30 DIAGNOSIS — Z23 Encounter for immunization: Secondary | ICD-10-CM | POA: Diagnosis not present

## 2022-10-30 DIAGNOSIS — I10 Essential (primary) hypertension: Secondary | ICD-10-CM

## 2022-10-30 DIAGNOSIS — E782 Mixed hyperlipidemia: Secondary | ICD-10-CM

## 2022-10-30 DIAGNOSIS — J4530 Mild persistent asthma, uncomplicated: Secondary | ICD-10-CM

## 2022-10-30 MED ORDER — MONTELUKAST SODIUM 10 MG PO TABS: 10.0000 mg | ORAL_TABLET | Freq: Every day | ORAL | 1 refills | Status: DC

## 2022-10-30 MED ORDER — SUNOSI 75 MG PO TABS: 1.0000 | ORAL_TABLET | Freq: Every morning | ORAL | 0 refills | Status: DC

## 2022-10-30 MED ORDER — VALSARTAN-HYDROCHLOROTHIAZIDE 320-12.5 MG PO TABS: 1.0000 | ORAL_TABLET | Freq: Every day | ORAL | 0 refills | Status: DC

## 2022-10-30 MED ORDER — DESLORATADINE 5 MG PO TABS: 5.0000 mg | ORAL_TABLET | Freq: Every day | ORAL | 1 refills | Status: AC

## 2022-10-30 MED ORDER — WEGOVY 2.4 MG/0.75ML ~~LOC~~ SOAJ: 2.4000 mg | SUBCUTANEOUS | 0 refills | Status: DC

## 2022-10-30 MED ORDER — AMLODIPINE BESYLATE 2.5 MG PO TABS: 2.5000 mg | ORAL_TABLET | Freq: Every morning | ORAL | 1 refills | Status: DC

## 2022-10-30 MED ORDER — FLUTICASONE PROPIONATE 50 MCG/ACT NA SUSP: 1.0000 | Freq: Every day | NASAL | 1 refills | Status: DC

## 2022-10-30 MED ORDER — ATORVASTATIN CALCIUM 20 MG PO TABS: 20.0000 mg | ORAL_TABLET | ORAL | 1 refills | Status: DC

## 2022-10-30 NOTE — Patient Instructions (Signed)
Please call insurance to find out if you can get Zepbound instead of [facility]

## 2022-11-01 ENCOUNTER — Encounter: Payer: Self-pay | Admitting: Family Medicine

## 2022-11-07 NOTE — Progress Notes (Unsigned)
Name: VAUGHAN ETCITTY   MRN: [id number]    DOB: 12-Feb-1980   Date:11/08/2022       Progress Note  Subjective  Chief Complaint  Annual Exam  HPI  Patient presents for annual CPE.  Diet: eating more chicken, more vegetables and fruit and cutting down on portion size  Exercise: continue regular physical activity   Last Eye Exam: due for an exam  Last Dental Exam: due for an appointment   Flowsheet Row Office Visit from 10/30/2022 in [facility]  AUDIT-C Score 1       Depression: Phq 9 is  negative    11/08/2022    2:57 PM 10/30/2022    3:30 PM 09/21/2022    7:50 AM 09/19/2022    3:54 PM 07/31/2022    7:42 AM  Depression screen PHQ 2/9  Decreased Interest 0 0 0  0  Down, Depressed, Hopeless 0 0 0  0  PHQ - 2 Score 0 0 0  0  Altered sleeping 0 3   3  Tired, decreased energy 0 3   3  Change in appetite 0 0   0  Feeling bad or failure about yourself  0 0   0  Trouble concentrating 0 2   0  Moving slowly or fidgety/restless 0 0   0  Suicidal thoughts 0 0   0  PHQ-9 Score 0 8   6  Difficult doing work/chores Not difficult at all         Information is confidential and restricted. Go to Review Flowsheets to unlock data.   Hypertension: BP Readings from Last 3 Encounters:  11/08/22 136/70  10/30/22 124/70  07/31/22 128/70   Obesity: Wt Readings from Last 3 Encounters:  11/08/22 (!) 317 lb 4.8 oz (143.9 kg)  10/30/22 (!) 325 lb (147.4 kg)  09/21/22 (!) 323 lb (146.5 kg)   BMI Readings from Last 3 Encounters:  11/08/22 52.80 kg/m  10/30/22 54.08 kg/m  09/21/22 55.44 kg/m     Vaccines:   HPV: N/A Tdap: up to date Shingrix: N/A Pneumonia: up to date Flu: up to date COVID-19: up to date   Hep C Screening: 04/04/18 STD testing and prevention (HIV/chl/gon/syphilis): 04/04/18 Intimate partner violence: negative screen  Sexual History : same partner for the past four years , low libido Menstrual History/LMP/Abnormal Bleeding: s/p  hysterectomy  Discussed importance of follow up if any post-menopausal bleeding: yes  Incontinence Symptoms: negative for symptoms   Breast cancer:  - Last Mammogram: she is due for a mammogram  - BRCA gene screening: N/A  Osteoporosis Prevention : Discussed high calcium and vitamin D supplementation, weight bearing exercises Bone density: N/A   Cervical cancer screening: N/A - s/p hysterectomy   Skin cancer: Discussed monitoring for atypical lesions  Colorectal cancer: 11/09/19  repeat in 2026  Lung cancer:  Low Dose CT Chest recommended if Age 109-80 years, 20 pack-year currently smoking OR have quit w/in 15years. Patient does not qualify for screen   ECG: 07/25/21  Advanced Care Planning: A voluntary discussion about advance care planning including the explanation and discussion of advance directives.  Discussed health care proxy and Living will, and the patient was able to identify a health care proxy as Elaina Pattee .  Patient does not have a living will and power of attorney of health care   Lipids: Lab Results  Component Value Date   CHOL 186 12/15/2021   CHOL 171 12/06/2020   CHOL 151  08/06/2019   Lab Results  Component Value Date   HDL 60 12/15/2021   HDL 42 (L) 12/06/2020   HDL 33 (L) 08/06/2019   Lab Results  Component Value Date   LDLCALC 110 (H) 12/15/2021   LDLCALC 111 (H) 12/06/2020   LDLCALC 91 08/06/2019   Lab Results  Component Value Date   TRIG 73 12/15/2021   TRIG 87 12/06/2020   TRIG 134 08/06/2019   Lab Results  Component Value Date   CHOLHDL 3.1 12/15/2021   CHOLHDL 4.1 12/06/2020   CHOLHDL 4.6 08/06/2019   No results found for: "LDLDIRECT"  Glucose: Glucose  Date Value Ref Range Status  08/26/2013 84 65 - 99 mg/dL Final  29/52/8413 84 65 - 99 mg/dL Final  24/40/1027 253 (H) 65 - 99 mg/dL Final   Glucose, Bld  Date Value Ref Range Status  12/15/2021 80 65 - 99 mg/dL Final    Comment:    .            Fasting reference interval .    07/18/2021 103 (H) 70 - 99 mg/dL Final    Comment:    Glucose reference range applies only to samples taken after fasting for at least 8 hours.  12/06/2020 90 65 - 99 mg/dL Final    Comment:    .            Fasting reference interval .     Patient Active Problem List   Diagnosis Date Noted   Chronic cough 02/13/2022   Skin-picking disorder 12/19/2021   Adjustment disorder with depressed mood 06/13/2021   Closed nondisplaced longitudinal fracture of right patella 06/09/2021   GAD (generalized anxiety disorder) 05/05/2021   Depressive disorder 05/05/2021   History of borderline personality disorder 05/05/2021   History of alcohol abuse 05/05/2021   Internal derangement of left knee 04/03/2021   Primary osteoarthritis of left knee 04/03/2021   Metabolic syndrome 03/24/2021   Impaired fasting glucose 12/06/2020   Lymphedema 12/06/2020   Moderate episode of recurrent major depressive disorder (HCC) 02/08/2020   CKD (chronic kidney disease) stage 3, GFR 30-59 ml/min (HCC) 06/11/2019   PTSD (post-traumatic stress disorder) 01/14/2019   Borderline personality disorder (HCC) 01/14/2019   Anxiety 01/14/2019   Mixed hyperlipidemia 01/14/2019   Morbid obesity with BMI of 50.0-59.9, adult (HCC) 01/14/2019   OSA on CPAP 01/14/2019   Asthma, mild persistent 04/30/2018   Low back pain 08/20/2017   Fibroadenoma of breast, 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   Essential hypertension     Past Surgical History:  Procedure Laterality Date   ABDOMINAL HYSTERECTOMY  01/08/2006   BREAST BIOPSY Left 01/09/2011   CORE - FIBROADENOMA VS PHYLLODES   CESAREAN SECTION  01/08/2005   COLONOSCOPY WITH PROPOFOL N/A 10/09/2016   Procedure: COLONOSCOPY WITH PROPOFOL;  Surgeon: Wyline Mood, MD;  Location: [facility] ENDOSCOPY;  Service: Gastroenterology;  Laterality: N/A;   COLONOSCOPY WITH PROPOFOL N/A 11/09/2019   Procedure: COLONOSCOPY WITH PROPOFOL;  Surgeon: Toney Reil, MD;  Location: [facility] ENDOSCOPY;  Service:  Gastroenterology;  Laterality: N/A;   ESOPHAGOGASTRODUODENOSCOPY     GUM SURGERY  01/08/2002   KNEE ARTHROSCOPY WITH MEDIAL MENISECTOMY Right 07/24/2021   Procedure: Right medial meniscus root repair and patella chondroplasty;  Surgeon: Signa Kell, MD;  Location: [facility] ORS;  Service: Orthopedics;  Laterality: Right;   PLANTAR FASCIA RELEASE Left 08/19/2014   Procedure: ENDOSCOPIC PLANTAR FASCIOTOMY RELEASE L WITH TOPAZ;  Surgeon: Recardo Evangelist, DPM;  Location: [facility];  Service: Podiatry;  Laterality: Left;  LMA WITH POPLITEAL BLOCK   TONSILLECTOMY  01/08/1982    Family History  Problem Relation Age of Onset   Obesity Mother    Diabetes Mother    High Cholesterol Mother    Hypertension Mother    Neuropathy Mother    Depression Mother    Anxiety disorder Mother    Obesity Father    Hypertension Father    High Cholesterol Father    Depression Father    Anxiety disorder Father    Seizures Brother    Testicular cancer Brother    Breast cancer Maternal Grandmother    Hypertension Maternal Grandmother    Aneurysm Maternal Grandmother    Stroke Maternal Grandmother    Hypertension Maternal Grandfather    High Cholesterol Maternal Grandfather    Heart disease Maternal Grandfather        has a pacemaker   Stroke Maternal Grandfather    Alzheimer's disease Paternal Grandmother    Dementia Paternal Grandmother    Colon cancer Paternal Grandfather    Mental illness Maternal Aunt    Suicidality Cousin     Social History   Socioeconomic History   Marital status: Divorced    Spouse name: Not on file   Number of children: 2   Years of education: 14   Highest education level: Bachelor's degree (e.g., BA, AB, BS)  Occupational History   Not on file  Tobacco Use   Smoking status: Former    Current packs/day: 0.00    Average packs/day: 0.3 packs/day for 10.0 years (2.5 ttl pk-yrs)    Types: Cigarettes    Start date: 07/04/2004    Quit date: 07/05/2014    Years since  quitting: 8.3   Smokeless tobacco: Never  Vaping Use   Vaping status: Never Used  Substance and Sexual Activity   Alcohol use: Yes    Comment: rare   Drug use: No   Sexual activity: Yes    Partners: Male    Birth control/protection: Surgical    Comment: 2007  Other Topics Concern   Not on file  Social History Narrative   Not on file   Social Determinants of Health   Financial Resource Strain: High Risk (10/29/2022)   Overall Financial Resource Strain (CARDIA)    Difficulty of Paying Living Expenses: Hard  Food Insecurity: Food Insecurity Present (10/29/2022)   Hunger Vital Sign    Worried About Running Out of Food in the Last Year: Sometimes true    Ran Out of Food in the Last Year: Never true  Transportation Needs: No Transportation Needs (10/29/2022)   PRAPARE - Administrator, Civil Service (Medical): No    Lack of Transportation (Non-Medical): No  Physical Activity: Sufficiently Active (10/29/2022)   Exercise Vital Sign    Days of Exercise per Week: 3 days    Minutes of Exercise per Session: 90 min  Stress: Stress Concern Present (10/29/2022)   [employer] of Occupational Health - Occupational Stress Questionnaire    Feeling of Stress : To some extent  Social Connections: Moderately Isolated (10/29/2022)   Social Connection and Isolation Panel [NHANES]    Frequency of Communication with Friends and Family: More than three times a week    Frequency of Social Gatherings with Friends and Family: Once a week    Attends Religious Services: Never    Database administrator or Organizations: No    Attends Banker Meetings: Not on file    Marital  Status: Living with partner  Intimate Partner Violence: Not At Risk (09/25/2018)   Humiliation, Afraid, Rape, and Kick questionnaire    Fear of Current or Ex-Partner: No    Emotionally Abused: No    Physically Abused: No    Sexually Abused: No     Current Outpatient Medications:    albuterol  (VENTOLIN HFA) 108 (90 Base) MCG/ACT inhaler, Inhale 2 puffs into the lungs every 4 (four) hours as needed for wheezing or shortness of breath., Disp: 9 g, Rfl: 0   amLODipine (NORVASC) 2.5 MG tablet, Take 1 tablet (2.5 mg total) by mouth every morning., Disp: 90 tablet, Rfl: 1   atorvastatin (LIPITOR) 20 MG tablet, Take 1 tablet (20 mg total) by mouth every morning., Disp: 90 tablet, Rfl: 1   buPROPion (WELLBUTRIN XL) 150 MG 24 hr tablet, Take 1 tablet (150 mg total) by mouth daily with breakfast. Stop wellbutrin xl 300 mg, Disp: 90 tablet, Rfl: 0   busPIRone (BUSPAR) 30 MG tablet, Take 1 tablet (30 mg total) by mouth 2 (two) times daily., Disp: 180 tablet, Rfl: 1   desloratadine (CLARINEX) 5 MG tablet, Take 1 tablet (5 mg total) by mouth daily., Disp: 90 tablet, Rfl: 1   escitalopram (LEXAPRO) 20 MG tablet, Take 1 tablet (20 mg total) by mouth daily., Disp: 90 tablet, Rfl: 1   fluticasone (FLONASE) 50 MCG/ACT nasal spray, Place 1 spray into both nostrils daily., Disp: 48 mL, Rfl: 1   gabapentin (NEURONTIN) 300 MG capsule, Take 1 capsule by mouth 3 (three) times daily., Disp: , Rfl:    Melatonin 10 MG CAPS, Take 10-20 mg by mouth at bedtime., Disp: , Rfl:    montelukast (SINGULAIR) 10 MG tablet, Take 1 tablet (10 mg total) by mouth at bedtime., Disp: 90 tablet, Rfl: 1   Semaglutide-Weight Management (WEGOVY) 2.4 MG/0.75ML SOAJ, Inject 2.4 mg into the skin once a week., Disp: 18 mL, Rfl: 0   Solriamfetol HCl (SUNOSI) 75 MG TABS, Take 1 tablet (75 mg total) by mouth every morning., Disp: 90 tablet, Rfl: 0   valsartan-hydrochlorothiazide (DIOVAN-HCT) 320-12.5 MG tablet, Take 1 tablet by mouth daily., Disp: 90 tablet, Rfl: 0  Allergies  Allergen Reactions   Latex Rash   Metformin And Related     diarrhea     ROS  Constitutional: Negative for fever, positive for weight change.  Respiratory: Negative for cough and shortness of breath.   Cardiovascular: Negative for chest pain or palpitations.   Gastrointestinal: Negative for abdominal pain, no bowel changes.  Musculoskeletal: Negative for gait problem or joint swelling.  Skin: Negative for rash.  Neurological: Negative for dizziness or headache.  No other specific complaints in a complete review of systems (except as listed in HPI above).   Objective  Vitals:   11/08/22 1458  BP: 136/70  Pulse: 80  Resp: 16  Temp: 97.9 F (36.6 C)  SpO2: 96%  Weight: (!) 317 lb 4.8 oz (143.9 kg)  Height: 5\' 5"  (1.651 m)    Body mass index is 52.8 kg/m.  Physical Exam  Constitutional: Patient appears well-developed and well-nourished. Obese  No distress.  HEENT: head atraumatic, normocephalic, pupils equal and reactive to light, neck supple Cardiovascular: Normal rate, regular rhythm and normal heart sounds.  No murmur heard. No BLE edema. Pulmonary/Chest: Effort normal and breath sounds normal. No respiratory distress. Abdominal: Soft.  There is no tenderness. Psychiatric: Patient has a normal mood and affect. behavior is normal. Judgment and thought content normal.  Fall Risk:    11/08/2022    2:57 PM 10/30/2022    3:30 PM 09/21/2022    7:49 AM 07/31/2022    7:41 AM 04/24/2022    9:26 AM  Fall Risk   Falls in the past year? 0 1 0 1 1  Number falls in past yr: 0 0 0 1 1  Injury with Fall? 0 0  0 1  Risk for fall due to :  No Fall Risks No Fall Risks No Fall Risks No Fall Risks  Follow up  Falls prevention discussed  Falls prevention discussed Falls prevention discussed     Functional Status Survey: Is the patient deaf or have difficulty hearing?: Yes Does the patient have difficulty seeing, even when wearing glasses/contacts?: Yes Does the patient have difficulty concentrating, remembering, or making decisions?: Yes Does the patient have difficulty walking or climbing stairs?: Yes Does the patient have difficulty dressing or bathing?: No Does the patient have difficulty doing errands alone such as visiting a doctor's  office or shopping?: Yes   Assessment & Plan  1. Well adult exam  - CBC with Differential/Platelet - COMPLETE METABOLIC PANEL WITH GFR - Lipid panel - Hemoglobin A1c - VITAMIN D 25 Hydroxy (Vit-D Deficiency, Fractures) - Parathyroid hormone, intact (no Ca)  2. Encounter for screening mammogram for malignant neoplasm of breast  - MM 3D SCREENING MAMMOGRAM BILATERAL BREAST; Future  3. Stage 3a chronic kidney disease (HCC)  - CBC with Differential/Platelet - COMPLETE METABOLIC PANEL WITH GFR - VITAMIN D 25 Hydroxy (Vit-D Deficiency, Fractures) - Parathyroid hormone, intact (no Ca)  4. Mixed hyperlipidemia  - Lipid panel  5. Hyperglycemia  - Hemoglobin A1c  6. Long-term use of high-risk medication  - CBC with Differential/Platelet - COMPLETE METABOLIC PANEL WITH GFR  7. Other fatigue  - B12 and Folate Panel  8. Screening examination for STI  - Urine cytology ancillary only - HIV Antibody (routine testing w rflx) - RPR    -USPSTF grade A and B recommendations reviewed with patient; age-appropriate recommendations, preventive care, screening tests, etc discussed and encouraged; healthy living encouraged; see AVS for patient education given to patient -Discussed importance of 150 minutes of physical activity weekly, eat two servings of fish weekly, eat one serving of tree nuts ( cashews, pistachios, pecans, almonds.Marland Kitchen) every other day, eat 6 servings of fruit/vegetables daily and drink plenty of water and avoid sweet beverages.   -Reviewed Health Maintenance: Yes.

## 2022-11-08 ENCOUNTER — Other Ambulatory Visit (HOSPITAL_COMMUNITY)
Admission: RE | Admit: 2022-11-08 | Discharge: 2022-11-08 | Disposition: A | Discharge status: Home / Self Care | Payer: 59 | Source: Ambulatory Visit | Attending: Family Medicine | Admitting: Family Medicine

## 2022-11-08 ENCOUNTER — Ambulatory Visit (INDEPENDENT_AMBULATORY_CARE_PROVIDER_SITE_OTHER): Payer: 59 | Admitting: Family Medicine

## 2022-11-08 ENCOUNTER — Encounter: Payer: Self-pay | Admitting: Family Medicine

## 2022-11-08 VITALS — BP 136/70 | HR 80 | Temp 97.9°F | Resp 16 | Ht 65.0 in | Wt 317.3 lb

## 2022-11-08 DIAGNOSIS — Z113 Encounter for screening for infections with a predominantly sexual mode of transmission: Secondary | ICD-10-CM

## 2022-11-08 DIAGNOSIS — N1831 Chronic kidney disease, stage 3a: Secondary | ICD-10-CM | POA: Diagnosis not present

## 2022-11-08 DIAGNOSIS — E782 Mixed hyperlipidemia: Secondary | ICD-10-CM | POA: Diagnosis not present

## 2022-11-08 DIAGNOSIS — R739 Hyperglycemia, unspecified: Secondary | ICD-10-CM

## 2022-11-08 DIAGNOSIS — Z1231 Encounter for screening mammogram for malignant neoplasm of breast: Secondary | ICD-10-CM

## 2022-11-08 DIAGNOSIS — R5383 Other fatigue: Secondary | ICD-10-CM

## 2022-11-08 DIAGNOSIS — Z0000 Encounter for general adult medical examination without abnormal findings: Secondary | ICD-10-CM | POA: Diagnosis not present

## 2022-11-08 DIAGNOSIS — Z79899 Other long term (current) drug therapy: Secondary | ICD-10-CM

## 2022-11-09 LAB — LIPID PANEL
Cholesterol: 147 mg/dL (ref <200)
HDL: 47 mg/dL — ABNORMAL LOW (ref >=50)
LDL Cholesterol (Calc): 80 mg/dL
Non-HDL Cholesterol (Calc): 100 mg/dL (ref <130)
Total CHOL/HDL Ratio: 3.1 (calc) (ref <5.0)
Triglycerides: 100 mg/dL (ref <150)

## 2022-11-09 LAB — COMPLETE METABOLIC PANEL WITH GFR
AG Ratio: 2.1 (calc) (ref 1.0–2.5)
ALT: 17 U/L (ref 6–29)
AST: 8 U/L — ABNORMAL LOW (ref 10–30)
Albumin: 3.9 g/dL (ref 3.6–5.1)
Alkaline phosphatase (APISO): 45 U/L (ref 31–125)
BUN/Creatinine Ratio: 11 (calc) (ref 6–22)
BUN: 12 mg/dL (ref 7–25)
CO2: 32 mmol/L (ref 20–32)
Calcium: 9.3 mg/dL (ref 8.6–10.2)
Chloride: 102 mmol/L (ref 98–110)
Creat: 1.12 mg/dL — ABNORMAL HIGH (ref 0.50–0.99)
Globulin: 1.9 g/dL (ref 1.9–3.7)
Glucose, Bld: 83 mg/dL (ref 65–99)
Potassium: 4.4 mmol/L (ref 3.5–5.3)
Sodium: 142 mmol/L (ref 135–146)
Total Bilirubin: 0.3 mg/dL (ref 0.2–1.2)
Total Protein: 5.8 g/dL — ABNORMAL LOW (ref 6.1–8.1)
eGFR: 63 mL/min/{1.73_m2} (ref >=60)

## 2022-11-09 LAB — CBC WITH DIFFERENTIAL/PLATELET
Absolute Lymphocytes: 3125 {cells}/uL (ref 850–3900)
Absolute Monocytes: 794 {cells}/uL (ref 200–950)
Basophils Absolute: 87 {cells}/uL (ref 0–200)
Basophils Relative: 0.7 %
Eosinophils Absolute: 260 {cells}/uL (ref 15–500)
Eosinophils Relative: 2.1 %
HCT: 41.9 % (ref 35.0–45.0)
Hemoglobin: 13.6 g/dL (ref 11.7–15.5)
MCH: 31 pg (ref 27.0–33.0)
MCHC: 32.5 g/dL (ref 32.0–36.0)
MCV: 95.4 fL (ref 80.0–100.0)
MPV: 9.9 fL (ref 7.5–12.5)
Monocytes Relative: 6.4 %
Neutro Abs: 8134 {cells}/uL — ABNORMAL HIGH (ref 1500–7800)
Neutrophils Relative %: 65.6 %
Platelets: 308 10*3/uL (ref 140–400)
RBC: 4.39 10*6/uL (ref 3.80–5.10)
RDW: 12.5 % (ref 11.0–15.0)
Total Lymphocyte: 25.2 %
WBC: 12.4 10*3/uL — ABNORMAL HIGH (ref 3.8–10.8)

## 2022-11-09 LAB — VITAMIN D 25 HYDROXY (VIT D DEFICIENCY, FRACTURES): Vit D, 25-Hydroxy: 37 ng/mL (ref 30–100)

## 2022-11-09 LAB — RPR: RPR Ser Ql: NONREACTIVE

## 2022-11-09 LAB — HEMOGLOBIN A1C
Hgb A1c MFr Bld: 5.2 %{Hb} (ref <5.7)
Mean Plasma Glucose: 103 mg/dL
eAG (mmol/L): 5.7 mmol/L

## 2022-11-09 LAB — HIV ANTIBODY (ROUTINE TESTING W REFLEX): HIV 1&2 Ab, 4th Generation: NONREACTIVE

## 2022-11-09 LAB — B12 AND FOLATE PANEL
Folate: 4.3 ng/mL — ABNORMAL LOW
Vitamin B-12: 309 pg/mL (ref 200–1100)

## 2022-11-09 LAB — PARATHYROID HORMONE, INTACT (NO CA): PTH: 67 pg/mL (ref 16–77)

## 2022-11-12 ENCOUNTER — Encounter: Payer: Self-pay | Admitting: Family Medicine

## 2022-11-12 LAB — URINE CYTOLOGY ANCILLARY ONLY
Chlamydia: NEGATIVE
Comment: NEGATIVE
Comment: NORMAL
Neisseria Gonorrhea: NEGATIVE

## 2022-11-14 ENCOUNTER — Other Ambulatory Visit: Payer: Self-pay | Admitting: Family Medicine

## 2022-11-14 DIAGNOSIS — L0100 Impetigo, unspecified: Secondary | ICD-10-CM

## 2022-11-14 MED ORDER — DOXYCYCLINE HYCLATE 100 MG PO TABS: 100.0000 mg | ORAL_TABLET | Freq: Two times a day (BID) | ORAL | 0 refills | Status: DC

## 2022-11-29 ENCOUNTER — Other Ambulatory Visit: Payer: Self-pay | Admitting: Family Medicine

## 2022-11-29 DIAGNOSIS — G4733 Obstructive sleep apnea (adult) (pediatric): Secondary | ICD-10-CM

## 2022-12-02 ENCOUNTER — Other Ambulatory Visit: Payer: Self-pay | Admitting: Family Medicine

## 2022-12-02 DIAGNOSIS — G4733 Obstructive sleep apnea (adult) (pediatric): Secondary | ICD-10-CM

## 2022-12-03 ENCOUNTER — Other Ambulatory Visit: Payer: Self-pay | Admitting: Psychiatry

## 2022-12-03 DIAGNOSIS — F424 Excoriation (skin-picking) disorder: Secondary | ICD-10-CM

## 2022-12-03 DIAGNOSIS — Z8659 Personal history of other mental and behavioral disorders: Secondary | ICD-10-CM

## 2022-12-03 DIAGNOSIS — F411 Generalized anxiety disorder: Secondary | ICD-10-CM

## 2022-12-03 DIAGNOSIS — F4310 Post-traumatic stress disorder, unspecified: Secondary | ICD-10-CM

## 2022-12-03 MED ORDER — BUPROPION HCL ER (XL) 150 MG PO TB24: 150.0000 mg | ORAL_TABLET | Freq: Every day | ORAL | 0 refills | Status: DC

## 2022-12-03 MED ORDER — ESCITALOPRAM OXALATE 20 MG PO TABS: 20.0000 mg | ORAL_TABLET | Freq: Every day | ORAL | 1 refills | Status: DC

## 2022-12-03 MED ORDER — BUSPIRONE HCL 30 MG PO TABS: 30.0000 mg | ORAL_TABLET | Freq: Two times a day (BID) | ORAL | 1 refills | Status: DC

## 2022-12-03 NOTE — Telephone Encounter (Signed)
I have sent Wellbutrin to pharmacy at [facility].

## 2022-12-04 ENCOUNTER — Other Ambulatory Visit: Payer: Self-pay

## 2022-12-12 ENCOUNTER — Emergency Department (HOSPITAL_COMMUNITY): Payer: 59

## 2022-12-12 ENCOUNTER — Other Ambulatory Visit: Payer: Self-pay

## 2022-12-12 ENCOUNTER — Emergency Department (HOSPITAL_COMMUNITY)
Admission: EM | Admit: 2022-12-12 | Discharge: 2022-12-12 | Disposition: A | Discharge status: Home / Self Care | Payer: 59 | Attending: Emergency Medicine | Admitting: Emergency Medicine

## 2022-12-12 DIAGNOSIS — Z91040 Latex allergy status: Secondary | ICD-10-CM | POA: Insufficient documentation

## 2022-12-12 DIAGNOSIS — R42 Dizziness and giddiness: Secondary | ICD-10-CM | POA: Insufficient documentation

## 2022-12-12 DIAGNOSIS — R110 Nausea: Secondary | ICD-10-CM | POA: Insufficient documentation

## 2022-12-12 DIAGNOSIS — R0789 Other chest pain: Secondary | ICD-10-CM | POA: Insufficient documentation

## 2022-12-12 DIAGNOSIS — R079 Chest pain, unspecified: Secondary | ICD-10-CM

## 2022-12-12 LAB — CBC
HCT: 44.3 % (ref 36.0–46.0)
Hemoglobin: 14.6 g/dL (ref 12.0–15.0)
MCH: 31 pg (ref 26.0–34.0)
MCHC: 33 g/dL (ref 30.0–36.0)
MCV: 94.1 fL (ref 80.0–100.0)
Platelets: 277 10*3/uL (ref 150–400)
RBC: 4.71 MIL/uL (ref 3.87–5.11)
RDW: 12.7 % (ref 11.5–15.5)
WBC: 10 10*3/uL (ref 4.0–10.5)
nRBC: 0 % (ref 0.0–0.2)

## 2022-12-12 LAB — URINALYSIS, ROUTINE W REFLEX MICROSCOPIC
Bilirubin Urine: NEGATIVE
Glucose, UA: NEGATIVE mg/dL
Hgb urine dipstick: NEGATIVE
Ketones, ur: NEGATIVE mg/dL
Leukocytes,Ua: NEGATIVE
Nitrite: NEGATIVE
Protein, ur: NEGATIVE mg/dL
Specific Gravity, Urine: 1.025 (ref 1.005–1.030)
pH: 5 (ref 5.0–8.0)

## 2022-12-12 LAB — BASIC METABOLIC PANEL
Anion gap: 9 (ref 5–15)
BUN: 12 mg/dL (ref 6–20)
CO2: 25 mmol/L (ref 22–32)
Calcium: 9 mg/dL (ref 8.9–10.3)
Chloride: 105 mmol/L (ref 98–111)
Creatinine, Ser: 1.14 mg/dL — ABNORMAL HIGH (ref 0.44–1.00)
GFR, Estimated: >60 mL/min (ref >60)
Glucose, Bld: 131 mg/dL — ABNORMAL HIGH (ref 70–99)
Potassium: 3.4 mmol/L — ABNORMAL LOW (ref 3.5–5.1)
Sodium: 139 mmol/L (ref 135–145)

## 2022-12-12 LAB — TROPONIN I (HIGH SENSITIVITY): Troponin I (High Sensitivity): 3 ng/L (ref <18)

## 2022-12-12 LAB — CBG MONITORING, ED: Glucose-Capillary: 132 mg/dL — ABNORMAL HIGH (ref 70–99)

## 2022-12-12 MED ORDER — KETOROLAC TROMETHAMINE 15 MG/ML IJ SOLN
15.0000 mg | Freq: Once | INTRAMUSCULAR | Status: AC
  Administered 2022-12-12: 15 mg via INTRAMUSCULAR
  Filled 2022-12-12: qty 1

## 2022-12-12 MED ORDER — KETOROLAC TROMETHAMINE 15 MG/ML IJ SOLN: 15.0000 mg | Freq: Once | INTRAMUSCULAR | Status: DC

## 2022-12-12 NOTE — Discharge Instructions (Signed)
Your workup was reassuring.  You had no pneumonia or collapsed lung.  Your marker for heart damage was normal.  Unlikely that this was a heart attack or a blood clot in the lung.  You had pain when I have pushed on your chest wall.  Most likely this is musculoskeletal.  Please follow-up with your family doctor in the office. Max otc dosing below  Take 4 over the counter ibuprofen tablets 3 times a day or 2 over-the-counter naproxen tablets twice a day for pain. Also take tylenol 1000mg (2 extra strength) four times a day.

## 2022-12-12 NOTE — ED Provider Notes (Signed)
[facility]GENCY DEPARTMENT AT [facility] Provider Note   CSN: [id number] Arrival date & time: 12/12/22  0102     History  Chief Complaint  Patient presents with   Chest Pain   Nausea   Dizziness    Barbara Pennington is a 42 y.o. female.  42 yo F with a chief complaints of chest pain.  This has been off and on for some time but has worsened overnight.  She feels like she can point to it with 1 finger and if she could just do something it would make it better.  Suddenly got much worse about 7 hours ago.  Woke her up from sleep.  She had trouble going back to sleep had some nausea when she walked into the ED but denies vomiting denies diaphoresis denies difficulty breathing.  Denies exertional symptoms but is having difficulties exercising due to chronic knee pain which she is seeing orthopedics and getting gel injections.  Patient denies history of MI, denies hypertension hyperlipidemia diabetes or smoking.  Denies family history of MI.  Patient denies history of PE or DVT denies hemoptysis denies unilateral lower extremity edema denies recent surgery immobilization hospitalization estrogen use or history of cancer.     Chest Pain Associated symptoms: dizziness   Dizziness Associated symptoms: chest pain        Home Medications Prior to Admission medications   Medication Sig Start Date End Date Taking? Authorizing Provider  albuterol (VENTOLIN HFA) 108 (90 Base) MCG/ACT inhaler Inhale 2 puffs into the lungs every 4 (four) hours as needed for wheezing or shortness of breath. 12/15/21   Alba Cory, MD  amLODipine (NORVASC) 2.5 MG tablet Take 1 tablet (2.5 mg total) by mouth every morning. 10/30/22   Alba Cory, MD  atorvastatin (LIPITOR) 20 MG tablet Take 1 tablet (20 mg total) by mouth every morning. 10/30/22   Alba Cory, MD  buPROPion (WELLBUTRIN XL) 150 MG 24 hr tablet Take 1 tablet (150 mg total) by mouth daily with breakfast. Stop wellbutrin xl  300 mg 12/03/22   Jomarie Longs, MD  busPIRone (BUSPAR) 30 MG tablet Take 1 tablet (30 mg total) by mouth 2 (two) times daily. 12/03/22   Jomarie Longs, MD  desloratadine (CLARINEX) 5 MG tablet Take 1 tablet (5 mg total) by mouth daily. 10/30/22 10/25/23  Alba Cory, MD  doxycycline (VIBRA-TABS) 100 MG tablet Take 1 tablet (100 mg total) by mouth 2 (two) times daily. 11/14/22   Alba Cory, MD  escitalopram (LEXAPRO) 20 MG tablet Take 1 tablet (20 mg total) by mouth daily. 12/03/22   Jomarie Longs, MD  fluticasone (FLONASE) 50 MCG/ACT nasal spray Place 1 spray into both nostrils daily. 10/30/22 10/30/23  Alba Cory, MD  gabapentin (NEURONTIN) 300 MG capsule Take 1 capsule by mouth 3 (three) times daily. 09/28/21 09/28/22  Signa Kell, MD  Melatonin 10 MG CAPS Take 10-20 mg by mouth at bedtime.    [provider]  meloxicam (MOBIC) 15 MG tablet Take 15 mg by mouth daily. 12/03/22 12/03/23  [provider]  montelukast (SINGULAIR) 10 MG tablet Take 1 tablet (10 mg total) by mouth at bedtime. 10/30/22   Alba Cory, MD  Semaglutide-Weight Management (WEGOVY) 2.4 MG/0.75ML SOAJ Inject 2.4 mg into the skin once a week. 10/30/22   Alba Cory, MD  Solriamfetol HCl (SUNOSI) 75 MG TABS Take 1 tablet (75 mg total) by mouth every morning. 10/30/22   Alba Cory, MD  valsartan-hydrochlorothiazide (DIOVAN-HCT) 320-12.5 MG tablet Take  1 tablet by mouth daily. 10/30/22   Alba Cory, MD      Allergies    Latex and Metformin and related    Review of Systems   Review of Systems  Cardiovascular:  Positive for chest pain.  Neurological:  Positive for dizziness.    Physical Exam Updated Vital Signs BP 120/64   Pulse 76   Temp 98 F (36.7 C)   Resp 18   Ht 5\' 5"  (1.651 m)   Wt (!) 147.4 kg   SpO2 99%   BMI 54.08 kg/m  Physical Exam Vitals and nursing note reviewed.  Constitutional:      General: She is not in acute distress.    Appearance: She  is well-developed. She is not diaphoretic.     Comments: BMI 54  HENT:     Head: Normocephalic and atraumatic.  Eyes:     Pupils: Pupils are equal, round, and reactive to light.  Cardiovascular:     Rate and Rhythm: Normal rate and regular rhythm.     Heart sounds: No murmur heard.    No friction rub. No gallop.  Pulmonary:     Effort: Pulmonary effort is normal.     Breath sounds: No wheezing or rales.  Chest:     Chest wall: Tenderness present.     Comments: Palpation along the right sternal border about ribs 5 through 6 reproduce her discomfort Abdominal:     General: There is no distension.     Palpations: Abdomen is soft.     Tenderness: There is no abdominal tenderness.  Musculoskeletal:        General: No tenderness.     Cervical back: Normal range of motion and neck supple.  Skin:    General: Skin is warm and dry.  Neurological:     Mental Status: She is alert and oriented to person, place, and time.  Psychiatric:        Behavior: Behavior normal.     ED Results / Procedures / Treatments   Labs (all labs ordered are listed, but only abnormal results are displayed) Labs Reviewed  CBG MONITORING, ED - Abnormal; Notable for the following components:      Result Value   Glucose-Capillary 132 (*)    All other components within normal limits  CBC  BASIC METABOLIC PANEL  URINALYSIS, ROUTINE W REFLEX MICROSCOPIC  TROPONIN I (HIGH SENSITIVITY)    EKG EKG Interpretation Date/Time:  Wednesday December 12 2022 08:24:37 EST Ventricular Rate:  77 PR Interval:  124 QRS Duration:  84 QT Interval:  396 QTC Calculation: 448 R Axis:   70  Text Interpretation: Normal sinus rhythm Normal ECG No significant change since last tracing Confirmed by Melene Plan [phone]) on 12/12/2022 8:35:11 AM  Radiology No results found.  Procedures Procedures    Medications Ordered in ED Medications - No data to display  ED Course/ Medical Decision Making/ A&P                                  Medical Decision Making Amount and/or Complexity of Data Reviewed Labs: ordered. Radiology: ordered.  Risk Prescription drug management.   42 yo F with a chief complaint of chest pain.  This is atypical in nature and reproduced on exam.  Troponin negative PERC negative chest x-ray independently interpreted by me without focal infiltrate or pneumothorax.  No significant electrolyte abnormalities.  No acute anemia.  Patient has had pain greater than 6 hours without significant change.  Do not feel delta is warranted.  Will treat as musculoskeletal.  Discharged home.  PCP follow-up.  1:07 PM:  I have discussed the diagnosis/risks/treatment options with the patient.  Evaluation and diagnostic testing in the emergency department does not suggest an emergent condition requiring admission or immediate intervention beyond what has been performed at this time.  They will follow up with PCP. We also discussed returning to the ED immediately if new or worsening sx occur. We discussed the sx which are most concerning (e.g., sudden worsening pain, fever, inability to tolerate by mouth) that necessitate immediate return. Medications administered to the patient during their visit and any new prescriptions provided to the patient are listed below.  Medications given during this visit Medications  ketorolac (TORADOL) 15 MG/ML injection 15 mg (15 mg Intramuscular Given 12/12/22 1132)     The patient appears reasonably screen and/or stabilized for discharge and I doubt any other medical condition or other [facility] requiring further screening, evaluation, or treatment in the ED at this time prior to discharge.          Final Clinical Impression(s) / ED Diagnoses Final diagnoses:  None    Rx / DC Orders ED Discharge Orders     None         Melene Plan, DO 12/12/22 1308

## 2022-12-12 NOTE — ED Triage Notes (Signed)
Pt. Stated, I started having chest pain during the night with some Nausea. DEnies any SOB, extra swelling. Normal stress.

## 2022-12-17 ENCOUNTER — Encounter: Payer: Self-pay | Admitting: Psychiatry

## 2022-12-17 ENCOUNTER — Telehealth (INDEPENDENT_AMBULATORY_CARE_PROVIDER_SITE_OTHER): Payer: 59 | Admitting: Psychiatry

## 2022-12-17 ENCOUNTER — Other Ambulatory Visit: Payer: Self-pay | Admitting: Family Medicine

## 2022-12-17 ENCOUNTER — Ambulatory Visit: Payer: 59 | Attending: Nurse Practitioner

## 2022-12-17 ENCOUNTER — Ambulatory Visit (INDEPENDENT_AMBULATORY_CARE_PROVIDER_SITE_OTHER): Payer: 59 | Admitting: Nurse Practitioner

## 2022-12-17 ENCOUNTER — Encounter: Payer: Self-pay | Admitting: Nurse Practitioner

## 2022-12-17 VITALS — BP 134/86 | HR 78 | Temp 97.6°F | Resp 18 | Ht 65.0 in | Wt 315.4 lb

## 2022-12-17 DIAGNOSIS — R079 Chest pain, unspecified: Secondary | ICD-10-CM | POA: Diagnosis not present

## 2022-12-17 DIAGNOSIS — I10 Essential (primary) hypertension: Secondary | ICD-10-CM

## 2022-12-17 DIAGNOSIS — Z79899 Other long term (current) drug therapy: Secondary | ICD-10-CM

## 2022-12-17 DIAGNOSIS — F1011 Alcohol abuse, in remission: Secondary | ICD-10-CM

## 2022-12-17 DIAGNOSIS — E782 Mixed hyperlipidemia: Secondary | ICD-10-CM

## 2022-12-17 DIAGNOSIS — Z8659 Personal history of other mental and behavioral disorders: Secondary | ICD-10-CM

## 2022-12-17 DIAGNOSIS — N1831 Chronic kidney disease, stage 3a: Secondary | ICD-10-CM

## 2022-12-17 DIAGNOSIS — F424 Excoriation (skin-picking) disorder: Secondary | ICD-10-CM | POA: Diagnosis not present

## 2022-12-17 DIAGNOSIS — R002 Palpitations: Secondary | ICD-10-CM | POA: Diagnosis not present

## 2022-12-17 DIAGNOSIS — G4733 Obstructive sleep apnea (adult) (pediatric): Secondary | ICD-10-CM

## 2022-12-17 DIAGNOSIS — F411 Generalized anxiety disorder: Secondary | ICD-10-CM

## 2022-12-17 DIAGNOSIS — F4310 Post-traumatic stress disorder, unspecified: Secondary | ICD-10-CM | POA: Diagnosis not present

## 2022-12-17 NOTE — Progress Notes (Signed)
BP 134/86 (BP Location: Right Arm, Patient Position: Sitting, Cuff Size: Large)   Pulse 78   Temp 97.6 F (36.4 C) (Oral)   Resp 18   Ht 5\' 5"  (1.651 m) Comment: per chart  Wt (!) 315 lb 6.4 oz (143.1 kg)   SpO2 97%   BMI 52.49 kg/m    Subjective:    Patient ID: Barbara Pennington, female    DOB: 1980/09/19, 42 y.o.   MRN: [id number]  HPI: Barbara Pennington is a 42 y.o. female  Chief Complaint  Patient presents with   Follow-up    ER visit 12/12/2022   Discussed the use of AI scribe software for clinical note transcription with the patient, who gave verbal consent to proceed.  History of Present Illness   The patient, with a history of hypertension and hyperlipidemia, presents with intermittent chest pain for the past year and a half. The pain is localized to one area in the chest and sometimes radiates to between the shoulder blades. The episodes typically last for 15-20 minutes and can occur at any time, even waking the patient from sleep. The pain is described as a steady discomfort, not severe enough to cause crying, and is not relieved by deep breaths. The patient denies any specific triggers for the pain, but notes that it can be worsened by deep breaths and potentially by stressful situations. Over-the-counter pain medications, including extra strength Tylenol and ibuprofen, have not provided relief. The patient also reports a history of palpitations, described as a fluttering sensation in the chest. She denies any abnormal shortness of breath or sweating. The patient has a family history of heart problems in an uncle, but no immediate family members with heart disease.       12/17/2022    1:07 PM 12/12/2022   11:00 AM 12/12/2022   10:30 AM  Vitals with BMI  Height 5\' 5"     Weight 315 lbs 6 oz    BMI 52.49    Systolic 134 129 295  Diastolic 86 84 85  Pulse 78 63 62    Relevant past medical, surgical, family and social history reviewed and updated as indicated. Interim medical  history since our last visit reviewed. Allergies and medications reviewed and updated.  Review of Systems  Ten systems reviewed and is negative except as mentioned in HPI       Objective:    BP 134/86 (BP Location: Right Arm, Patient Position: Sitting, Cuff Size: Large)   Pulse 78   Temp 97.6 F (36.4 C) (Oral)   Resp 18   Ht 5\' 5"  (1.651 m) Comment: per chart  Wt (!) 315 lb 6.4 oz (143.1 kg)   SpO2 97%   BMI 52.49 kg/m   Wt Readings from Last 3 Encounters:  12/17/22 (!) 315 lb 6.4 oz (143.1 kg)  12/12/22 (!) 325 lb (147.4 kg)  11/08/22 (!) 317 lb 4.8 oz (143.9 kg)    Physical Exam  Constitutional: Patient appears well-developed and well-nourished. Obese  No distress.  HEENT: head atraumatic, normocephalic, pupils equal and reactive to light, neck supple Cardiovascular: Normal rate, regular rhythm and normal heart sounds.  No murmur heard. No BLE edema. Pulmonary/Chest: Effort normal and breath sounds normal. No respiratory distress. Abdominal: Soft.  There is no tenderness. Psychiatric: Patient has a normal mood and affect. behavior is normal. Judgment and thought content normal.  Results for orders placed or performed during the hospital encounter of 12/12/22  Basic metabolic panel  Result  Value Ref Range   Sodium 139 135 - 145 mmol/L   Potassium 3.4 (L) 3.5 - 5.1 mmol/L   Chloride 105 98 - 111 mmol/L   CO2 25 22 - 32 mmol/L   Glucose, Bld 131 (H) 70 - 99 mg/dL   BUN 12 6 - 20 mg/dL   Creatinine, Ser 1.61 (H) 0.44 - 1.00 mg/dL   Calcium 9.0 8.9 - 09.6 mg/dL   GFR, Estimated >04 >54 mL/min   Anion gap 9 5 - 15  CBC  Result Value Ref Range   WBC 10.0 4.0 - 10.5 K/uL   RBC 4.71 3.87 - 5.11 MIL/uL   Hemoglobin 14.6 12.0 - 15.0 g/dL   HCT 09.8 11.9 - 14.7 %   MCV 94.1 80.0 - 100.0 fL   MCH 31.0 26.0 - 34.0 pg   MCHC 33.0 30.0 - 36.0 g/dL   RDW 82.9 56.2 - 13.0 %   Platelets 277 150 - 400 K/uL   nRBC 0.0 0.0 - 0.2 %  Urinalysis, Routine w reflex microscopic  -Urine, Clean Catch  Result Value Ref Range   Color, Urine YELLOW YELLOW   APPearance CLEAR CLEAR   Specific Gravity, Urine 1.025 1.005 - 1.030   pH 5.0 5.0 - 8.0   Glucose, UA NEGATIVE NEGATIVE mg/dL   Hgb urine dipstick NEGATIVE NEGATIVE   Bilirubin Urine NEGATIVE NEGATIVE   Ketones, ur NEGATIVE NEGATIVE mg/dL   Protein, ur NEGATIVE NEGATIVE mg/dL   Nitrite NEGATIVE NEGATIVE   Leukocytes,Ua NEGATIVE NEGATIVE  CBG monitoring, ED  Result Value Ref Range   Glucose-Capillary 132 (H) 70 - 99 mg/dL  Troponin I (High Sensitivity)  Result Value Ref Range   Troponin I (High Sensitivity) 3 <18 ng/L      Assessment & Plan:   Problem List Items Addressed This Visit   None Visit Diagnoses     Chest pain, unspecified type    -  Primary   Relevant Orders   Ambulatory referral to Cardiology   LONG TERM MONITOR (3-14 DAYS)   Palpitations       Relevant Orders   Ambulatory referral to Cardiology   LONG TERM MONITOR (3-14 DAYS)       Assessment and Plan    Chest Pain Recurrent, localized chest pain for the past 1.5 years, exacerbated by deep breaths. Recent ER visit ruled out acute coronary syndrome. Pain is reproducible on exam, suggesting a possible musculoskeletal etiology. However, the intermittent nature of the pain and associated palpitations warrant further investigation. -Continue current regimen of ibuprofen and Tylenol as needed for pain. -Order a heart monitor to evaluate for arrhythmias. -Refer to cardiology for further evaluation.  Hypertension and Hyperlipidemia Well-controlled on current regimen. -Continue valsartan-hydrochlorothiazide 320-12.5mg  daily, atorvastatin 20mg  daily, and amlodipine 2.5mg  daily.  Follow-up Patient to follow-up with primary care provider, Dr. Carlynn Purl, in January. Patient to inform this office of cardiology appointment and heart monitor results via MyChart message.         Follow up plan: Return if symptoms worsen or fail to  improve.

## 2022-12-17 NOTE — Progress Notes (Unsigned)
Virtual Visit via Video Note  I connected with Barbara Pennington on 12/17/22 at  4:00 PM EST by a video enabled telemedicine application and verified that I am speaking with the correct person using two identifiers.  Location Provider Location : [facility] Patient Location : Home  Participants: Patient , Provider    I discussed the limitations of evaluation and management by telemedicine and the availability of in person appointments. The patient expressed understanding and agreed to proceed.   I discussed the assessment and treatment plan with the patient. The patient was provided an opportunity to ask questions and all were answered. The patient agreed with the plan and demonstrated an understanding of the instructions.   The patient was advised to call back or seek an in-person evaluation if the symptoms worsen or if the condition fails to improve as anticipated.    [facility] MD OP Progress Note  12/17/2022 4:30 PM Barbara Pennington  MRN:  [id number]  Chief Complaint:  Chief Complaint  Patient presents with   Follow-up   Anxiety   Depression   Medication Refill   HPI: Barbara Pennington is a 42 year old Caucasian female, divorced, currently lives in [place], has a history of PTSD, anxiety, skin picking disorder, borderline personality disorder, morbid obesity, OSA on CPAP, hypertension/chronic kidney disease, asthma, chronic back pain, right sided knee injury was evaluated by telemedicine today.   Barbara Pennington reports a difficult emotional period over the holidays due to the absence of her children, whom she has not seen in approximately eight years. This period of time, spanning from Thanksgiving to her children's birthdays in November and January, is particularly challenging for her. She reports that her daughter's recent birthday was especially hard.  Barbara Pennington has been on a regimen of Buspar 30mg  twice daily, Lexapro 20mg  daily, Gabapentin 300mg  three times a day, and Wellbutrin XL 150mg  in the morning. She  reports taking her medications regularly. However, she has been out of Sunosi for approximately two weeks due to insurance changes and has not yet refilled the prescription.  Barbara Pennington also reports recent chest pain and has been referred to a cardiologist by her primary care physician. She is awaiting a heart monitor in the mail for further evaluation.   She has been experiencing sleep disturbances, which she attributes to her mood and social issues, as well as her heart concerns.  Barbara Pennington has not been working with a therapist since her previous therapist left the practice. She expresses interest in starting therapy again, particularly with a focus on trauma, and is open to exploring EMDR. She also expresses interest in potentially changing her medication regimen, specifically considering a switch from Lexapro to another medication such as Prozac. However, she acknowledges the potential for worsening symptoms during the transition period.  Barbara Pennington's skin picking has reportedly improved, and she denies any thoughts of self-harm or harm to others. She has not added any new medications to her regimen and does not require any immediate refills.  Patient currently denies any suicidality, homicidality or perceptual disturbances.   Visit Diagnosis:    ICD-10-CM   1. PTSD (post-traumatic stress disorder)  F43.10 CANCELED: Urine drugs of abuse scrn w alc, routine (Ref Lab)    2. GAD (generalized anxiety disorder)  F41.1 CANCELED: Urine drugs of abuse scrn w alc, routine (Ref Lab)    3. Skin-picking disorder  F42.4 CANCELED: Urine drugs of abuse scrn w alc, routine (Ref Lab)    4. History of borderline personality disorder  Z86.59  5. History of alcohol abuse  F10.11     6. High risk medication use  Z79.899 Urine drugs of abuse scrn w alc, routine (Ref Lab)    CANCELED: Urine drugs of abuse scrn w alc, routine (Ref Lab)      Past Psychiatric History: I have reviewed past psychiatric history from progress  note on 05/05/2021.  Patient was previously under the care of [facility].  Past Medical History:  Past Medical History:  Diagnosis Date   Anxiety    Arthritis 2006   rhuematoid - mild - no current issues   Asthma 2003   Borderline personality disorder (HCC)    Chronic kidney disease    stage 3   Complication of anesthesia    "Usually" has low temp after surgery.After hysterectomy temp was 94   Depression    GERD (gastroesophageal reflux disease)    History of kidney stones    Hyperlipidemia    Hypertension 2013   Lump or mass in breast 2013   RIGHT BREAST   Motion sickness    repeated amusement park rides   PTSD (post-traumatic stress disorder)    Scoliosis    no current issues   Shortness of breath dyspnea    secondary to weight    Sleep apnea    uses cpap   Ulcer 2001    Past Surgical History:  Procedure Laterality Date   ABDOMINAL HYSTERECTOMY  01/08/2006   BREAST BIOPSY Left 01/09/2011   CORE - FIBROADENOMA VS PHYLLODES   CESAREAN SECTION  01/08/2005   COLONOSCOPY WITH PROPOFOL N/A 10/09/2016   Procedure: COLONOSCOPY WITH PROPOFOL;  Surgeon: Wyline Mood, MD;  Location: [facility] ENDOSCOPY;  Service: Gastroenterology;  Laterality: N/A;   COLONOSCOPY WITH PROPOFOL N/A 11/09/2019   Procedure: COLONOSCOPY WITH PROPOFOL;  Surgeon: Toney Reil, MD;  Location: [facility] ENDOSCOPY;  Service: Gastroenterology;  Laterality: N/A;   ESOPHAGOGASTRODUODENOSCOPY     GUM SURGERY  01/08/2002   KNEE ARTHROSCOPY WITH MEDIAL MENISECTOMY Right 07/24/2021   Procedure: Right medial meniscus root repair and patella chondroplasty;  Surgeon: Signa Kell, MD;  Location: [facility] ORS;  Service: Orthopedics;  Laterality: Right;   PLANTAR FASCIA RELEASE Left 08/19/2014   Procedure: ENDOSCOPIC PLANTAR FASCIOTOMY RELEASE L WITH TOPAZ;  Surgeon: Recardo Evangelist, DPM;  Location: [facility];  Service: Podiatry;  Laterality: Left;  LMA WITH POPLITEAL BLOCK   TONSILLECTOMY  01/08/1982    Family  Psychiatric History: I have reviewed family psychiatric history from progress note on 05/05/2021.  Family History:  Family History  Problem Relation Age of Onset   Obesity Mother    Diabetes Mother    High Cholesterol Mother    Hypertension Mother    Neuropathy Mother    Depression Mother    Anxiety disorder Mother    Obesity Father    Hypertension Father    High Cholesterol Father    Depression Father    Anxiety disorder Father    Seizures Brother    Testicular cancer Brother    Breast cancer Maternal Grandmother    Hypertension Maternal Grandmother    Aneurysm Maternal Grandmother    Stroke Maternal Grandmother    Hypertension Maternal Grandfather    High Cholesterol Maternal Grandfather    Heart disease Maternal Grandfather        has a pacemaker   Stroke Maternal Grandfather    Alzheimer's disease Paternal Grandmother    Dementia Paternal Grandmother    Colon cancer Paternal Grandfather    Mental illness Maternal  Aunt    Suicidality Cousin     Social History: I have reviewed social history from progress note on 05/05/2021. Social History   Socioeconomic History   Marital status: Divorced    Spouse name: Not on file   Number of children: 2   Years of education: 14   Highest education level: Bachelor's degree (e.g., BA, AB, BS)  Occupational History   Not on file  Tobacco Use   Smoking status: Former    Current packs/day: 0.00    Average packs/day: 0.3 packs/day for 10.0 years (2.5 ttl pk-yrs)    Types: Cigarettes    Start date: 07/04/2004    Quit date: 07/05/2014    Years since quitting: 8.4   Smokeless tobacco: Never  Vaping Use   Vaping status: Never Used  Substance and Sexual Activity   Alcohol use: Yes    Comment: rare   Drug use: No   Sexual activity: Yes    Partners: Male    Birth control/protection: Surgical    Comment: 2007  Other Topics Concern   Not on file  Social History Narrative   Not on file   Social Determinants of Health    Financial Resource Strain: High Risk (10/29/2022)   Overall Financial Resource Strain (CARDIA)    Difficulty of Paying Living Expenses: Hard  Food Insecurity: Food Insecurity Present (10/29/2022)   Hunger Vital Sign    Worried About Running Out of Food in the Last Year: Sometimes true    Ran Out of Food in the Last Year: Never true  Transportation Needs: No Transportation Needs (10/29/2022)   PRAPARE - Administrator, Civil Service (Medical): No    Lack of Transportation (Non-Medical): No  Physical Activity: Sufficiently Active (10/29/2022)   Exercise Vital Sign    Days of Exercise per Week: 3 days    Minutes of Exercise per Session: 90 min  Stress: Stress Concern Present (10/29/2022)   [employer] of Occupational Health - Occupational Stress Questionnaire    Feeling of Stress : To some extent  Social Connections: Moderately Isolated (10/29/2022)   Social Connection and Isolation Panel [NHANES]    Frequency of Communication with Friends and Family: More than three times a week    Frequency of Social Gatherings with Friends and Family: Once a week    Attends Religious Services: Never    Database administrator or Organizations: No    Attends Engineer, structural: Not on file    Marital Status: Living with partner    Allergies:  Allergies  Allergen Reactions   Latex Rash   Metformin And Related     diarrhea    Metabolic Disorder Labs: Lab Results  Component Value Date   HGBA1C 5.2 11/08/2022   MPG 103 11/08/2022   MPG 111 12/15/2021   No results found for: "PROLACTIN" Lab Results  Component Value Date   CHOL 147 11/08/2022   TRIG 100 11/08/2022   HDL 47 (L) 11/08/2022   CHOLHDL 3.1 11/08/2022   VLDL 27 08/06/2019   LDLCALC 80 11/08/2022   LDLCALC 110 (H) 12/15/2021   Lab Results  Component Value Date   TSH 1.83 12/15/2021   TSH 2.59 04/04/2018    Therapeutic Level Labs: No results found for: "LITHIUM" No results found for:  "VALPROATE" No results found for: "CBMZ"  Current Medications: Current Outpatient Medications  Medication Sig Dispense Refill   acetaminophen (TYLENOL) 500 MG tablet Take 1,000 mg by mouth every 6 (six) hours  as needed for moderate pain (pain score 4-6).     amLODipine (NORVASC) 2.5 MG tablet Take 1 tablet (2.5 mg total) by mouth every morning. 90 tablet 1   atorvastatin (LIPITOR) 20 MG tablet Take 1 tablet (20 mg total) by mouth every morning. 90 tablet 1   buPROPion (WELLBUTRIN XL) 150 MG 24 hr tablet Take 1 tablet (150 mg total) by mouth daily with breakfast. Stop wellbutrin xl 300 mg 90 tablet 0   busPIRone (BUSPAR) 30 MG tablet Take 1 tablet (30 mg total) by mouth 2 (two) times daily. 180 tablet 1   desloratadine (CLARINEX) 5 MG tablet Take 1 tablet (5 mg total) by mouth daily. 90 tablet 1   doxycycline (VIBRA-TABS) 100 MG tablet Take 1 tablet (100 mg total) by mouth 2 (two) times daily. (Patient not taking: Reported on 12/12/2022) 14 tablet 0   escitalopram (LEXAPRO) 20 MG tablet Take 1 tablet (20 mg total) by mouth daily. 90 tablet 1   fluticasone (FLONASE) 50 MCG/ACT nasal spray Place 1 spray into both nostrils daily. 48 mL 1   gabapentin (NEURONTIN) 300 MG capsule Take 1 capsule by mouth 3 (three) times daily.     Melatonin 10 MG CAPS Take 10-20 mg by mouth at bedtime.     meloxicam (MOBIC) 15 MG tablet Take 15 mg by mouth daily.     montelukast (SINGULAIR) 10 MG tablet Take 1 tablet (10 mg total) by mouth at bedtime. 90 tablet 1   Semaglutide-Weight Management (WEGOVY) 2.4 MG/0.75ML SOAJ Inject 2.4 mg into the skin once a week. 18 mL 0   Solriamfetol HCl (SUNOSI) 75 MG TABS Take 1 tablet (75 mg total) by mouth every morning. 90 tablet 0   valsartan-hydrochlorothiazide (DIOVAN-HCT) 320-12.5 MG tablet Take 1 tablet by mouth daily. 90 tablet 0   No current facility-administered medications for this visit.     Musculoskeletal: Strength & Muscle Tone:  UTA Gait & Station:   Seated Patient leans: N/A  Psychiatric Specialty Exam: Review of Systems  Psychiatric/Behavioral:  Positive for dysphoric mood and sleep disturbance. The patient is nervous/anxious.     There were no vitals taken for this visit.There is no height or weight on file to calculate BMI.  General Appearance: Casual  Eye Contact:  Fair  Speech:  Clear and Coherent  Volume:  Normal  Mood:  Anxious and Depressed  Affect:  Tearful  Thought Process:  Goal Directed and Descriptions of Associations: Intact  Orientation:  Full (Time, Place, and Person)  Thought Content: Logical   Suicidal Thoughts:  No  Homicidal Thoughts:  No  Memory:  Immediate;   Fair Recent;   Fair Remote;   Fair  Judgement:  Fair  Insight:  Good  Psychomotor Activity:  Normal  Concentration:  Concentration: Fair and Attention Span: Fair  Recall:  [employer] of Knowledge: Fair  Language: Fair  Akathisia:  No  Handed:  Right  AIMS (if indicated): not done  Assets:  Desire for Improvement Housing Social Support Transportation  ADL's:  Intact  Cognition: WNL  Sleep:   Restless   Screenings: AIMS    Flowsheet Row Video Visit from 06/13/2021 in [facility] Office Visit from 05/05/2021 in [facility]  AIMS Total Score 0 0      GAD-7    Flowsheet Row Office Visit from 12/17/2022 in [facility] Office Visit from 09/19/2022 in [facility] Counselor  from 10/30/2021 in [facility] at [facility] Visit from 05/30/2021 in [facility] at [facility] Video Visit from 05/26/2021 in [facility]  Total GAD-7 Score 0 1 21 11 7       PHQ2-9    Flowsheet Row Office Visit from 12/17/2022 in [facility] Office Visit from 11/08/2022 in [facility] Office Visit from 10/30/2022 in [facility] Video Visit from 09/21/2022 in [facility] Office Visit from 09/19/2022 in [facility]  PHQ-2 Total Score 0 0 0 0 1  PHQ-9 Total Score 0 0 8 -- --      Flowsheet Row ED from 12/12/2022 in [facility] Department at [facility] Office Visit from 09/19/2022 in [facility] Counselor from 07/20/2022 in [facility] RISK CATEGORY No Risk No Risk Low Risk        Assessment and Plan: Barbara Pennington is a 42 year old Caucasian female who has a history of PTSD, borderline personality disorder, depression was evaluated by telemedicine today.  Patient is currently struggling with anxiety/sadness due to holiday season, not having any contact with her children, discussed assessment and plan as noted below.  Generalized Anxiety Disorder-unstable Chronic condition exacerbated by holiday stressors. Currently on Buspar 30 mg twice daily, Lexapro 20 mg daily, and Wellbutrin 150 mg daily (reduced due to [facility]). Ran out of Sunosi for two weeks due to insurance issues. Discussed potential medication change but deferred due to pending cardiac evaluation. Explained that tapering off Lexapro and starting Prozac could temporarily worsen symptoms. Emphasized social support and coping strategies. - Continue Buspar 30 mg twice daily - Continue Lexapro 20 mg daily - Continue Wellbutrin 150 mg daily - Refer to therapist for cognitive behavioral therapy - Order urine drug screen for potential Klonopin prescription - Prescribe clonazepam 0.5 mg as needed for severe anxiety attacks only/limit use pending urine drug screen report.  Post-Traumatic Stress Disorder (PTSD)-unstable Chronic condition with significant emotional distress related to lack of contact with children.  Discussed potential benefit of EMDR therapy for processing traumatic memories. - Refer to therapist for cognitive behavioral therapy - Consider EMDR therapy if interested - Will consider trial of Prozac however will wait for Holter monitoring to be completed.  Borderline Personality Disorder-per history. Chronic condition with emotional instability, particularly during holidays. No current therapy. Emphasized the importance of therapy to manage symptoms. - Refer to therapist for cognitive behavioral therapy  Skin Picking Disorder-improving Chronic condition, currently improved with ongoing treatment. Emphasized the importance of therapy to maintain improvement. - Continue current medications - Refer to therapist for cognitive behavioral therapy  Alcohol Use Disorder (in remission) Sober since 2017. No current issues reported.   Chest Pain Intermittent chest pain under evaluation by primary care. Awaiting heart monitor and cardiologist referral. Discussed the need to hold off on changing psychiatric medications until cardiac evaluation is complete due to potential cardiac effects. - Hold off on changing psychiatric medications until cardiac evaluation is complete - Schedule follow-up appointment after cardiac evaluation  Follow-up - Schedule follow-up appointment on January 16th at 3:30 PM - Conduct follow-up via video.   Collaboration of Care: Collaboration of Care: Referral or follow-up with counselor/therapist AEB patient to establish care with therapist I have communicated with staff to schedule this patient with a therapist.  Patient/Guardian  was advised Release of Information must be obtained prior to any record release in order to collaborate their care with an outside provider. Patient/Guardian was advised if they have not already done so to contact the registration department to sign all necessary forms in order for [name] to release information regarding their care.   Consent:  Patient/Guardian gives verbal consent for treatment and assignment of benefits for services provided during this visit. Patient/Guardian expressed understanding and agreed to proceed.   This note was generated in part or whole with voice recognition software. Voice recognition is usually quite accurate but there are transcription errors that can and very often do occur. I apologize for any typographical errors that were not detected and corrected.    Jomarie Longs, MD 12/17/2022, 4:30 PM

## 2022-12-18 ENCOUNTER — Other Ambulatory Visit
Admission: RE | Admit: 2022-12-18 | Discharge: 2022-12-18 | Disposition: A | Discharge status: Home / Self Care | Payer: 59 | Source: Ambulatory Visit | Attending: Psychiatry | Admitting: Psychiatry

## 2022-12-18 DIAGNOSIS — Z79899 Other long term (current) drug therapy: Secondary | ICD-10-CM | POA: Insufficient documentation

## 2022-12-19 DIAGNOSIS — R079 Chest pain, unspecified: Secondary | ICD-10-CM | POA: Diagnosis not present

## 2022-12-19 DIAGNOSIS — R002 Palpitations: Secondary | ICD-10-CM | POA: Diagnosis not present

## 2022-12-21 ENCOUNTER — Telehealth: Payer: Self-pay | Admitting: Psychiatry

## 2022-12-21 DIAGNOSIS — F424 Excoriation (skin-picking) disorder: Secondary | ICD-10-CM

## 2022-12-21 DIAGNOSIS — F411 Generalized anxiety disorder: Secondary | ICD-10-CM

## 2022-12-21 DIAGNOSIS — F4310 Post-traumatic stress disorder, unspecified: Secondary | ICD-10-CM

## 2022-12-21 LAB — URINE DRUGS OF ABUSE SCREEN W ALC, ROUTINE (REF LAB)
Amphetamines, Urine: NEGATIVE ng/mL
Barbiturate, Ur: NEGATIVE ng/mL
Benzodiazepine Quant, Ur: NEGATIVE ng/mL
Cannabinoid Quant, Ur: NEGATIVE ng/mL
Cocaine (Metab.): NEGATIVE ng/mL
Ethanol U, Quan: NEGATIVE %
Methadone Screen, Urine: NEGATIVE ng/mL
Opiate Quant, Ur: NEGATIVE ng/mL
Phencyclidine, Ur: NEGATIVE ng/mL
Propoxyphene, Urine: NEGATIVE ng/mL

## 2022-12-21 MED ORDER — CLONAZEPAM 0.5 MG PO TABS: 0.5000 mg | ORAL_TABLET | Freq: Every day | ORAL | 0 refills | Status: DC | PRN

## 2022-12-21 NOTE — Telephone Encounter (Signed)
I have sent clonazepam to pharmacy as discussed during session for anxiety attacks.  Attempted to contact patient had to leave a voicemail.  Urine drug screen came back negative.

## 2022-12-24 ENCOUNTER — Other Ambulatory Visit: Payer: Self-pay | Admitting: Family Medicine

## 2022-12-24 NOTE — Telephone Encounter (Signed)
called to make sure pt got the message that was left. pt stated that she did and she pick up medication.

## 2022-12-27 ENCOUNTER — Other Ambulatory Visit: Payer: Self-pay | Admitting: Family Medicine

## 2022-12-27 DIAGNOSIS — G4733 Obstructive sleep apnea (adult) (pediatric): Secondary | ICD-10-CM

## 2022-12-27 NOTE — Telephone Encounter (Signed)
Requested medications are due for refill today.  A little too soon  Requested medications are on the active medications list.  yes  Last refill. 10/30/2022 #90 0 rf  Future visit scheduled.   yes  Notes to clinic.  Medication not assigned to a protocol. Please review for refill.    Requested Prescriptions  Pending Prescriptions Disp Refills   Solriamfetol HCl (SUNOSI) 75 MG TABS 90 tablet 0    Sig: Take 1 tablet (75 mg total) by mouth every morning.     Off-Protocol Failed - 12/27/2022  4:25 PM      Failed - Medication not assigned to a protocol, review manually.      Passed - Valid encounter within last 12 months    Recent Outpatient Visits           1 week ago Chest pain, unspecified type   [facility] Berniece Salines, FNP   1 month ago Well adult exam   [facility] Alba Cory, MD   1 month ago Morbid obesity with BMI of 50.0-59.9, adult [facility])   [facility] Alba Cory, MD   3 months ago COVID-19   [facility] Berniece Salines, FNP   4 months ago Primary osteoarthritis of left knee   [facility] Alba Cory, MD       Future Appointments             In 1 month Carlynn Purl, Danna Hefty, MD [facility], [facility]   In 1 month Branch, Alben Spittle, MD [facility]   In 10 months Alba Cory, MD [facility], [facility]

## 2022-12-27 NOTE — Telephone Encounter (Signed)
Pharmacy states they never received the Rx Solriamfetol HCl (SUNOSI) 75 MG TABS   That was sent 10/30/2022.  Asked if you can re send?  The pt is wanting to pick up.  [id number] - [place] [address] - [address]

## 2023-01-10 ENCOUNTER — Ambulatory Visit (INDEPENDENT_AMBULATORY_CARE_PROVIDER_SITE_OTHER): Payer: PRIVATE HEALTH INSURANCE | Admitting: Professional Counselor

## 2023-01-10 ENCOUNTER — Emergency Department: Payer: 59

## 2023-01-10 ENCOUNTER — Ambulatory Visit: Payer: Self-pay

## 2023-01-10 ENCOUNTER — Emergency Department
Admission: EM | Admit: 2023-01-10 | Discharge: 2023-01-10 | Disposition: A | Discharge status: Home / Self Care | Payer: 59 | Attending: Emergency Medicine | Admitting: Emergency Medicine

## 2023-01-10 ENCOUNTER — Other Ambulatory Visit: Payer: Self-pay

## 2023-01-10 DIAGNOSIS — I129 Hypertensive chronic kidney disease with stage 1 through stage 4 chronic kidney disease, or unspecified chronic kidney disease: Secondary | ICD-10-CM | POA: Insufficient documentation

## 2023-01-10 DIAGNOSIS — R0789 Other chest pain: Secondary | ICD-10-CM | POA: Diagnosis present

## 2023-01-10 DIAGNOSIS — J181 Lobar pneumonia, unspecified organism: Secondary | ICD-10-CM | POA: Insufficient documentation

## 2023-01-10 DIAGNOSIS — N189 Chronic kidney disease, unspecified: Secondary | ICD-10-CM | POA: Insufficient documentation

## 2023-01-10 DIAGNOSIS — D72829 Elevated white blood cell count, unspecified: Secondary | ICD-10-CM | POA: Diagnosis not present

## 2023-01-10 DIAGNOSIS — R079 Chest pain, unspecified: Secondary | ICD-10-CM

## 2023-01-10 DIAGNOSIS — J189 Pneumonia, unspecified organism: Secondary | ICD-10-CM

## 2023-01-10 DIAGNOSIS — F411 Generalized anxiety disorder: Secondary | ICD-10-CM | POA: Diagnosis not present

## 2023-01-10 DIAGNOSIS — J45909 Unspecified asthma, uncomplicated: Secondary | ICD-10-CM | POA: Insufficient documentation

## 2023-01-10 LAB — CBC
HCT: 40.9 % (ref 36.0–46.0)
Hemoglobin: 13.2 g/dL (ref 12.0–15.0)
MCH: 31.1 pg (ref 26.0–34.0)
MCHC: 32.3 g/dL (ref 30.0–36.0)
MCV: 96.2 fL (ref 80.0–100.0)
Platelets: 214 10*3/uL (ref 150–400)
RBC: 4.25 MIL/uL (ref 3.87–5.11)
RDW: 13.1 % (ref 11.5–15.5)
WBC: 12.5 10*3/uL — ABNORMAL HIGH (ref 4.0–10.5)
nRBC: 0 % (ref 0.0–0.2)

## 2023-01-10 LAB — BASIC METABOLIC PANEL
Anion gap: 11 (ref 5–15)
BUN: 7 mg/dL (ref 6–20)
CO2: 19 mmol/L — ABNORMAL LOW (ref 22–32)
Calcium: 8.2 mg/dL — ABNORMAL LOW (ref 8.9–10.3)
Chloride: 104 mmol/L (ref 98–111)
Creatinine, Ser: 0.94 mg/dL (ref 0.44–1.00)
GFR, Estimated: >60 mL/min (ref >60)
Glucose, Bld: 100 mg/dL — ABNORMAL HIGH (ref 70–99)
Potassium: 3.5 mmol/L (ref 3.5–5.1)
Sodium: 134 mmol/L — ABNORMAL LOW (ref 135–145)

## 2023-01-10 LAB — TROPONIN I (HIGH SENSITIVITY): Troponin I (High Sensitivity): 3 ng/L (ref <18)

## 2023-01-10 MED ORDER — AZITHROMYCIN 500 MG PO TABS: 500.0000 mg | ORAL_TABLET | Freq: Every day | ORAL | 0 refills | Status: AC

## 2023-01-10 MED ORDER — AMOXICILLIN-POT CLAVULANATE 875-125 MG PO TABS: 1.0000 | ORAL_TABLET | Freq: Two times a day (BID) | ORAL | 0 refills | Status: AC
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
## 2023-01-10 NOTE — Telephone Encounter (Signed)
  Chief Complaint: Chest tightness, fatigue, difficulty breathing, SOB Symptoms: above Frequency: ongoing -  Pertinent Negatives: Patient denies current chest pain Disposition: [x] ED /[] Urgent Care (no appt availability in office) / [] Appointment(In office/virtual)/ []  [facility]/ [] Home Care/ [] Refused Recommended Disposition /[] [facility]/ []  Follow-up with PCP Additional Notes: Pt states that on Monday and Tuesday she was having chest tightness/pain that radiated into arm. Pt is still having chest tightness, and difficulty breathing. Advised ED.    Reason for Disposition  Difficulty breathing  Answer Assessment - Initial Assessment Questions 1. LOCATION: Where does it hurt?       Chest tightness - difficulty breathing 2. RADIATION: Does the pain go anywhere else? (e.g., into neck, jaw, arms, back)     Was radiating into arm on Tuesday 3. ONSET: When did the chest pain begin? (Minutes, hours or days)      ongoing 4. PATTERN: Does the pain come and go, or has it been constant since it started?  Does it get worse with exertion?      Comes and goes  6. SEVERITY: How bad is the pain?  (e.g., Scale 1-10; mild, moderate, or severe)    - MILD (1-3): doesn't interfere with normal activities     - MODERATE (4-7): interferes with normal activities or awakens from sleep    - SEVERE (8-10): excruciating pain, unable to do any normal activities       Moderate - chest tightness now 10. OTHER SYMPTOMS: Do you have any other symptoms? (e.g., dizziness, nausea, vomiting, sweating, fever, difficulty breathing, cough)       Fatigue, chest tightness  Protocols used: Chest Pain-A-AH

## 2023-01-10 NOTE — ED Provider Notes (Signed)
 [facility] Provider Note    Event Date/Time   First MD Initiated Contact with Patient 01/10/23 1322     (approximate)   History   Chest Pain   HPI  Shaunessy L Schmeling is a 43 year old female with history of HTN, anxiety, CKD, asthma presenting to the emergency department for evaluation of chest pain.  On Tuesday, patient had onset of chest pain described as a tightness in her chest.  Symptoms have continued over the past few days.  No fevers.  Did develop recent cough and shortness of breath.  Has been using her asthma inhaler more frequently.    Physical Exam   Triage Vital Signs: ED Triage Vitals [01/10/23 1141]  Encounter Vitals Group     BP (!) 139/94     Systolic BP Percentile      Diastolic BP Percentile      Pulse Rate 98     Resp 12     Temp 98.9 F (37.2 C)     Temp Source Oral     SpO2 96 %     Weight      Height      Head Circumference      Peak Flow      Pain Score 3     Pain Loc      Pain Education      Exclude from Growth Chart     Most recent vital signs: Vitals:   01/10/23 1141  BP: (!) 139/94  Pulse: 98  Resp: 12  Temp: 98.9 F (37.2 C)  SpO2: 96%     General: Awake, interactive  CV:  Regular rate, good peripheral perfusion.  Resp:  Unlabored respirations, lungs clear to auscultation without significant wheezing Abd:  Nondistended.  Neuro:  Symmetric facial movement, fluid speech   ED Results / Procedures / Treatments   Labs (all labs ordered are listed, but only abnormal results are displayed) Labs Reviewed  BASIC METABOLIC PANEL - Abnormal; Notable for the following components:      Result Value   Sodium 134 (*)    CO2 19 (*)    Glucose, Bld 100 (*)    Calcium  8.2 (*)    All other components within normal limits  CBC - Abnormal; Notable for the following components:   WBC 12.5 (*)    All other components within normal limits  TROPONIN I (HIGH SENSITIVITY)     EKG EKG independently reviewed  interpreted by myself (ER attending) demonstrates:  EKG demonstrates normal sinus rhythm rate of 97, PR 122, QRS 90, QTc 431, no acute ST changes  RADIOLOGY Imaging independently reviewed and interpreted by myself demonstrates:  CXR demonstrates mild middle lobe pneumonia  PROCEDURES:  Critical Care performed: No  Procedures   MEDICATIONS ORDERED IN ED: Medications - No data to display   IMPRESSION / MDM / ASSESSMENT AND PLAN / ED COURSE  I reviewed the triage vital signs and the nursing notes.  Differential diagnosis includes, but is not limited to, viral illness, ACS, pneumonia, pneumothorax, low risk PE and PERC negative, low suspicion aortic dissection based on clinical history  Patient's presentation is most consistent with acute presentation with potential threat to life or bodily function.  43 year old female presenting to the emergency department for evaluation of chest pain.  Vital stable on presentation.  Labs with mild leukocytosis.  Negative troponin and well over 3 hours of symptoms.  EKG without acute ischemic findings.  Chest x-Anibal Quinby does demonstrate likely right  middle lobe pneumonia correlating with patient's clinical history of cough and increased shortness of breath.  Suspect she may have a component of pleurisy contributing to her chest pain.  Fortunately not hypoxic here and without significant wheezing.  Do think she is stable for outpatient management.  Patient is comfortable with this plan.  Will DC with prescription for antibiotic and short steroid course.  Strict return precautions provided.  Patient discharged stable condition.      FINAL CLINICAL IMPRESSION(S) / ED DIAGNOSES   Final diagnoses:  Community acquired pneumonia of right middle lobe of lung  Nonspecific chest pain     Rx / DC Orders   ED Discharge Orders          Ordered    amoxicillin -clavulanate (AUGMENTIN ) 875-125 MG tablet  2 times daily        01/10/23 1439    azithromycin   (ZITHROMAX ) 500 MG tablet  Daily        01/10/23 1439             Note:  This document was prepared using Dragon voice recognition software and may include unintentional dictation errors.   Levander Slate, MD 01/10/23 [phone]

## 2023-01-10 NOTE — ED Triage Notes (Signed)
 Pt to ED via POV from home. Pt ambulatory to triage. Pt reports right sided CP with radiation to right arm and back. Pt reports symptoms have been intermittent over the last several weeks. Pt seen at [facility] ER for same with negative workup. Pt also reports chills, N/V and SOB.

## 2023-01-10 NOTE — Discharge Instructions (Addendum)
 You were seen in the emergency room today for your chest pain.  Your testing did show a likely pneumonia on the right side of your lung.  I suspect this is contributing to your symptoms.  I sent a prescription for 2 antibiotics to your pharmacy.  Please take these as directed.  I also sent a short steroid course.  You can continue to use over-the-counter medicine as needed.  Follow with your primary care doctor for reevaluation.  Return to the ER for new or worsening symptoms.

## 2023-01-10 NOTE — ED Notes (Signed)
 See triage notes. Patient c/o right sided chest pain that radiates to right arm and back. Patient stated this has been happening over the last several weeks on and off.

## 2023-01-12 ENCOUNTER — Encounter: Payer: Self-pay | Admitting: Family Medicine

## 2023-01-12 ENCOUNTER — Encounter: Payer: Self-pay | Admitting: Nurse Practitioner

## 2023-01-15 ENCOUNTER — Ambulatory Visit (INDEPENDENT_AMBULATORY_CARE_PROVIDER_SITE_OTHER): Payer: 59 | Admitting: Family Medicine

## 2023-01-15 ENCOUNTER — Encounter: Payer: Self-pay | Admitting: Family Medicine

## 2023-01-15 VITALS — BP 138/84 | HR 92 | Temp 97.7°F | Resp 18 | Ht 65.0 in | Wt 311.3 lb

## 2023-01-15 DIAGNOSIS — G4733 Obstructive sleep apnea (adult) (pediatric): Secondary | ICD-10-CM

## 2023-01-15 DIAGNOSIS — E878 Other disorders of electrolyte and fluid balance, not elsewhere classified: Secondary | ICD-10-CM

## 2023-01-15 DIAGNOSIS — D72829 Elevated white blood cell count, unspecified: Secondary | ICD-10-CM | POA: Diagnosis not present

## 2023-01-15 DIAGNOSIS — J189 Pneumonia, unspecified organism: Secondary | ICD-10-CM | POA: Diagnosis not present

## 2023-01-15 DIAGNOSIS — R0602 Shortness of breath: Secondary | ICD-10-CM

## 2023-01-15 DIAGNOSIS — I471 Supraventricular tachycardia, unspecified: Secondary | ICD-10-CM

## 2023-01-15 NOTE — Progress Notes (Signed)
 Name: Barbara Pennington   MRN: [id number]    DOB: 07-Jul-1980   Date:01/15/2023       Progress Note  Subjective  Chief Complaint  Chief Complaint  Patient presents with   Follow-up    Dx w pneumonia at ER not feeling better, hard to breath even when laying down, still on antibiotic    HPI  Discussed the use of AI scribe software for clinical note transcription with the patient, who gave verbal consent to proceed.  History of Present Illness   The patient, with a history of asthma, presented with a chief complaint of chest pain that began on December 4th. Initially it was just chest pain without any SOB or cough. She went to [facility] on Dec 4 th CXR was negative, normal troponins. She followed up in our office and had Zio patch placed.   On January 2nd, the patient sought emergency care due to increase in chest pain but this time associated with some SOB , chest x-ray, which revealed right middle lobe pneumonia. The patient was discharged with a seven-day course of Augmentin  and a three-day course of azithromycin , . She states she is feeling a little better today but is tired of the fatigue and continues to have SOB that is better when wearing CPAP machine  The results of the Zio patch monitoring revealed episodes of supraventricular tachycardia, with a maximum heart rate of 207.          Patient Active Problem List   Diagnosis Date Noted   High risk medication use 12/17/2022   Chronic cough 02/13/2022   Skin-picking disorder 12/19/2021   Adjustment disorder with depressed mood 06/13/2021   Closed nondisplaced longitudinal fracture of right patella 06/09/2021   GAD (generalized anxiety disorder) 05/05/2021   Depressive disorder 05/05/2021   History of borderline personality disorder 05/05/2021   History of alcohol abuse 05/05/2021   Internal derangement of left knee 04/03/2021   Primary osteoarthritis of left knee 04/03/2021   Metabolic syndrome 03/24/2021   Impaired fasting glucose  12/06/2020   Lymphedema 12/06/2020   Moderate episode of recurrent major depressive disorder (HCC) 02/08/2020   CKD (chronic kidney disease) stage 3, GFR 30-59 ml/min (HCC) 06/11/2019   PTSD (post-traumatic stress disorder) 01/14/2019   Borderline personality disorder (HCC) 01/14/2019   Anxiety 01/14/2019   Mixed hyperlipidemia 01/14/2019   Morbid obesity with BMI of 50.0-59.9, adult (HCC) 01/14/2019   OSA on CPAP 01/14/2019   Asthma, mild persistent 04/30/2018   Low back pain 08/20/2017   Fibroadenoma of breast, 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   Essential hypertension     Social History   Tobacco Use   Smoking status: Former    Current packs/day: 0.00    Average packs/day: 0.3 packs/day for 10.0 years (2.5 ttl pk-yrs)    Types: Cigarettes    Start date: 07/04/2004    Quit date: 07/05/2014    Years since quitting: 8.5   Smokeless tobacco: Never  Substance Use Topics   Alcohol use: Yes    Comment: rare     Current Outpatient Medications:    acetaminophen  (TYLENOL ) 500 MG tablet, Take 1,000 mg by mouth every 6 (six) hours as needed for moderate pain (pain score 4-6)., Disp: , Rfl:    amLODipine  (NORVASC ) 2.5 MG tablet, Take 1 tablet (2.5 mg total) by mouth every morning., Disp: 90 tablet, Rfl: 1   amoxicillin -clavulanate (AUGMENTIN ) 875-125 MG tablet, Take 1 tablet by mouth 2 (two) times daily for 7 days., Disp: 14 tablet, Rfl:  0   atorvastatin  (LIPITOR) 20 MG tablet, Take 1 tablet (20 mg total) by mouth every morning., Disp: 90 tablet, Rfl: 1   buPROPion  (WELLBUTRIN  XL) 150 MG 24 hr tablet, Take 1 tablet (150 mg total) by mouth daily with breakfast. Stop wellbutrin  xl 300 mg, Disp: 90 tablet, Rfl: 0   busPIRone  (BUSPAR ) 30 MG tablet, Take 1 tablet (30 mg total) by mouth 2 (two) times daily., Disp: 180 tablet, Rfl: 1   clonazePAM  (KLONOPIN ) 0.5 MG tablet, Take 1 tablet (0.5 mg total) by mouth daily as needed for anxiety. Please limit use, Disp: 15 tablet, Rfl: 0   desloratadine  (CLARINEX ) 5 MG  tablet, Take 1 tablet (5 mg total) by mouth daily., Disp: 90 tablet, Rfl: 1   escitalopram  (LEXAPRO ) 20 MG tablet, Take 1 tablet (20 mg total) by mouth daily., Disp: 90 tablet, Rfl: 1   fluticasone  (FLONASE ) 50 MCG/ACT nasal spray, Place 1 spray into both nostrils daily., Disp: 48 mL, Rfl: 1   Melatonin 10 MG CAPS, Take 10-20 mg by mouth at bedtime., Disp: , Rfl:    meloxicam  (MOBIC ) 15 MG tablet, Take 15 mg by mouth daily., Disp: , Rfl:    montelukast  (SINGULAIR ) 10 MG tablet, Take 1 tablet (10 mg total) by mouth at bedtime., Disp: 90 tablet, Rfl: 1   Semaglutide -Weight Management (WEGOVY ) 2.4 MG/0.75ML SOAJ, Inject 2.4 mg into the skin once a week., Disp: 18 mL, Rfl: 0   Solriamfetol  HCl (SUNOSI ) 75 MG TABS, Take 1 tablet (75 mg total) by mouth every morning., Disp: 90 tablet, Rfl: 0   valsartan -hydrochlorothiazide  (DIOVAN -HCT) 320-12.5 MG tablet, Take 1 tablet by mouth daily., Disp: 90 tablet, Rfl: 0   gabapentin  (NEURONTIN ) 300 MG capsule, Take 1 capsule by mouth 3 (three) times daily., Disp: , Rfl:   Allergies  Allergen Reactions   Latex Rash   Metformin And Related     diarrhea    ROS  Ten systems reviewed and is negative except as mentioned in HPI    Objective  Vitals:   01/15/23 1024  BP: 138/84  Pulse: 92  Resp: 18  Temp: 97.7 F (36.5 C)  TempSrc: Oral  SpO2: 96%  Weight: (!) 311 lb 4.8 oz (141.2 kg)  Height: 5' 5 (1.651 m)    Body mass index is 51.8 kg/m.  Physical Exam  Constitutional: Patient appears well-developed and well-nourished. Obese  No distress.  HEENT: head atraumatic, normocephalic, pupils equal and reactive to light, ears normal TM, neck supple Cardiovascular: Normal rate, regular rhythm and normal heart sounds.  No murmur heard. No BLE edema. Pulmonary/Chest: Effort normal  normal lung sounds. No respiratory distress. Abdominal: Soft.  There is no tenderness. Psychiatric: Patient has a normal mood and affect. behavior is normal. Judgment and  thought content normal.   Recent Results (from the past 2160 hours)  Urine cytology ancillary only     Status: None   Collection Time: 11/08/22  3:10 PM  Result Value Ref Range   Neisseria Gonorrhea Negative    Chlamydia Negative    Comment Normal Reference Ranger Chlamydia - Negative    Comment      Normal Reference Range Neisseria Gonorrhea - Negative  CBC with Differential/Platelet     Status: Abnormal   Collection Time: 11/08/22  3:32 PM  Result Value Ref Range   WBC 12.4 (H) 3.8 - 10.8 Thousand/uL   RBC 4.39 3.80 - 5.10 Million/uL   Hemoglobin 13.6 11.7 - 15.5 g/dL   HCT 58.0 64.9 -  45.0 %   MCV 95.4 80.0 - 100.0 fL   MCH 31.0 27.0 - 33.0 pg   MCHC 32.5 32.0 - 36.0 g/dL    Comment: For adults, a slight decrease in the calculated MCHC value (in the range of 30 to 32 g/dL) is most likely not clinically significant; however, it should be interpreted with caution in correlation with other red cell parameters and the patient's clinical condition.    RDW 12.5 11.0 - 15.0 %   Platelets 308 140 - 400 Thousand/uL   MPV 9.9 7.5 - 12.5 fL   Neutro Abs 8,134 (H) 1,500 - 7,800 cells/uL   Absolute Lymphocytes 3,125 850 - 3,900 cells/uL   Absolute Monocytes 794 200 - 950 cells/uL   Eosinophils Absolute 260 15 - 500 cells/uL   Basophils Absolute 87 0 - 200 cells/uL   Neutrophils Relative % 65.6 %   Total Lymphocyte 25.2 %   Monocytes Relative 6.4 %   Eosinophils Relative 2.1 %   Basophils Relative 0.7 %  COMPLETE METABOLIC PANEL WITH GFR     Status: Abnormal   Collection Time: 11/08/22  3:32 PM  Result Value Ref Range   Glucose, Bld 83 65 - 99 mg/dL    Comment: .            Fasting reference interval .    BUN 12 7 - 25 mg/dL   Creat 8.87 (H) 9.49 - 0.99 mg/dL   eGFR 63 > OR = 60 fO/fpw/8.26f7   BUN/Creatinine Ratio 11 6 - 22 (calc)   Sodium 142 135 - 146 mmol/L   Potassium 4.4 3.5 - 5.3 mmol/L   Chloride 102 98 - 110 mmol/L   CO2 32 20 - 32 mmol/L   Calcium  9.3 8.6 -  10.2 mg/dL   Total Protein 5.8 (L) 6.1 - 8.1 g/dL   Albumin 3.9 3.6 - 5.1 g/dL   Globulin 1.9 1.9 - 3.7 g/dL (calc)   AG Ratio 2.1 1.0 - 2.5 (calc)   Total Bilirubin 0.3 0.2 - 1.2 mg/dL   Alkaline phosphatase (APISO) 45 31 - 125 U/L   AST 8 (L) 10 - 30 U/L   ALT 17 6 - 29 U/L  Lipid panel     Status: Abnormal   Collection Time: 11/08/22  3:32 PM  Result Value Ref Range   Cholesterol 147 <200 mg/dL   HDL 47 (L) > OR = 50 mg/dL   Triglycerides 899 <849 mg/dL   LDL Cholesterol (Calc) 80 mg/dL (calc)    Comment: Reference range: <100 . Desirable range <100 mg/dL for primary prevention;   <70 mg/dL for patients with CHD or diabetic patients  with > or = 2 CHD risk factors. SABRA LDL-C is now calculated using the Martin-Hopkins  calculation, which is a validated novel method providing  better accuracy than the Friedewald equation in the  estimation of LDL-C.  Gladis APPLETHWAITE et al. SANDREA. 7986;689(80): 2061-2068  (http://education.[facility].com/faq/FAQ164)    Total CHOL/HDL Ratio 3.1 <5.0 (calc)   Non-HDL Cholesterol (Calc) 100 <130 mg/dL (calc)    Comment: For patients with diabetes plus 1 major ASCVD risk  factor, treating to a non-HDL-C goal of <100 mg/dL  (LDL-C of <29 mg/dL) is considered a therapeutic  option.   Hemoglobin A1c     Status: None   Collection Time: 11/08/22  3:32 PM  Result Value Ref Range   Hgb A1c MFr Bld 5.2 <5.7 % of total Hgb    Comment: For the purpose of  screening for the presence of diabetes: . <5.7%       Consistent with the absence of diabetes 5.7-6.4%    Consistent with increased risk for diabetes             (prediabetes) > or =6.5%  Consistent with diabetes . This assay result is consistent with a decreased risk of diabetes. . Currently, no consensus exists regarding use of hemoglobin A1c for diagnosis of diabetes in children. . According to American Diabetes Association (ADA) guidelines, hemoglobin A1c <7.0% represents optimal control in  non-pregnant diabetic patients. Different metrics may apply to specific patient populations.  Standards of Medical Care in Diabetes(ADA). .    Mean Plasma Glucose 103 mg/dL   eAG (mmol/L) 5.7 mmol/L  VITAMIN D  25 Hydroxy (Vit-D Deficiency, Fractures)     Status: None   Collection Time: 11/08/22  3:32 PM  Result Value Ref Range   Vit D, 25-Hydroxy 37 30 - 100 ng/mL    Comment: Vitamin D  Status         25-OH Vitamin D : . Deficiency:                    <20 ng/mL Insufficiency:             20 - 29 ng/mL Optimal:                 > or = 30 ng/mL . For 25-OH Vitamin D  testing on patients on  D2-supplementation and patients for whom quantitation  of D2 and D3 fractions is required, the QuestAssureD(TM) 25-OH VIT D, (D2,D3), LC/MS/MS is recommended: order  code 07111 (patients >77yrs). . See Note 1 . Note 1 . For additional information, please refer to  http://education.[facility].com/faq/FAQ199  (This link is being provided for informational/ educational purposes only.)   Parathyroid  hormone, intact (no Ca)     Status: None   Collection Time: 11/08/22  3:32 PM  Result Value Ref Range   PTH 67 16 - 77 pg/mL    Comment: . Interpretive Guide    Intact PTH           Calcium  ------------------    ----------           ------- Normal Parathyroid     Normal               Normal Hypoparathyroidism    Low or Low Normal    Low Hyperparathyroidism    Primary            Normal or High       High    Secondary          High                 Normal or Low    Tertiary           High                 High Non-Parathyroid     Hypercalcemia      Low or Low Normal    High .   B12 and Folate Panel     Status: Abnormal   Collection Time: 11/08/22  3:32 PM  Result Value Ref Range   Vitamin B-12 309 200 - 1,100 pg/mL    Comment: . Please Note: Although the reference range for vitamin B12 is [phone] pg/mL, it has been reported that between 5 and 10% of patients with values between 200 and  400 pg/mL may experience neuropsychiatric and  hematologic abnormalities due to occult B12 deficiency; less than 1% of patients with values above 400 pg/mL will have symptoms. .    Folate 4.3 (L) ng/mL    Comment:                            Reference Range                            Low:           <3.4                            Borderline:    3.4-5.4                            Normal:        >5.4 .   HIV Antibody (routine testing w rflx)     Status: None   Collection Time: 11/08/22  3:32 PM  Result Value Ref Range   HIV 1&2 Ab, 4th Generation NON-REACTIVE NON-REACTIVE    Comment: HIV-1 antigen and HIV-1/HIV-2 antibodies were not detected. There is no laboratory evidence of HIV infection. SABRA PLEASE NOTE: This information has been disclosed to you from records whose confidentiality may be protected by state law.  If your state requires such protection, then the state law prohibits you from making any further disclosure of the information without the specific written consent of the person to whom it pertains, or as otherwise permitted by law. A general authorization for the release of medical or other information is NOT sufficient for this purpose. . For additional information please refer to http://education.[facility].com/faq/FAQ106 (This link is being provided for informational/ educational purposes only.) . SABRA The performance of this assay has not been clinically validated in patients less than 54 years old. .   RPR     Status: None   Collection Time: 11/08/22  3:32 PM  Result Value Ref Range   RPR Ser Ql NON-REACTIVE NON-REACTIVE    Comment: . No laboratory evidence of syphilis. If recent exposure is suspected, submit a new sample in 2-4 weeks. .   CBG monitoring, ED     Status: Abnormal   Collection Time: 12/12/22  8:28 AM  Result Value Ref Range   Glucose-Capillary 132 (H) 70 - 99 mg/dL    Comment: Glucose reference range applies only to samples taken  after fasting for at least 8 hours.  Basic metabolic panel     Status: Abnormal   Collection Time: 12/12/22  8:31 AM  Result Value Ref Range   Sodium 139 135 - 145 mmol/L   Potassium 3.4 (L) 3.5 - 5.1 mmol/L   Chloride 105 98 - 111 mmol/L   CO2 25 22 - 32 mmol/L   Glucose, Bld 131 (H) 70 - 99 mg/dL    Comment: Glucose reference range applies only to samples taken after fasting for at least 8 hours.   BUN 12 6 - 20 mg/dL   Creatinine, Ser 8.85 (H) 0.44 - 1.00 mg/dL   Calcium  9.0 8.9 - 10.3 mg/dL   GFR, Estimated >39 >39 mL/min    Comment: (NOTE) Calculated using the CKD-EPI Creatinine Equation (2021)    Anion gap 9 5 - 15    Comment: Performed at [facility], [address], [place], [place] [address]  CBC     Status: None   Collection Time: 12/12/22  8:31 AM  Result Value Ref Range   WBC 10.0 4.0 - 10.5 K/uL   RBC 4.71 3.87 - 5.11 MIL/uL   Hemoglobin 14.6 12.0 - 15.0 g/dL   HCT 55.6 63.9 - 53.9 %   MCV 94.1 80.0 - 100.0 fL   MCH 31.0 26.0 - 34.0 pg   MCHC 33.0 30.0 - 36.0 g/dL   RDW 87.2 88.4 - 84.4 %   Platelets 277 150 - 400 K/uL   nRBC 0.0 0.0 - 0.2 %    Comment: Performed at [facility], [address] [place], [place] [address]  Troponin I (High Sensitivity)     Status: None   Collection Time: 12/12/22  8:31 AM  Result Value Ref Range   Troponin I (High Sensitivity) 3 <18 ng/L    Comment: (NOTE) Elevated high sensitivity troponin I (hsTnI) values and significant  changes across serial measurements may suggest ACS but many other  chronic and acute conditions are known to elevate hsTnI results.  Refer to the Links section for chest pain algorithms and additional  guidance. Performed at [facility], [address], [place], [place] [address]   Urinalysis, Routine w reflex microscopic -Urine, Clean Catch     Status: None   Collection Time: 12/12/22  9:42 AM  Result Value Ref Range   Color, Urine YELLOW YELLOW   APPearance CLEAR CLEAR    Specific Gravity, Urine 1.025 1.005 - 1.030   pH 5.0 5.0 - 8.0   Glucose, UA NEGATIVE NEGATIVE mg/dL   Hgb urine dipstick NEGATIVE NEGATIVE   Bilirubin Urine NEGATIVE NEGATIVE   Ketones, ur NEGATIVE NEGATIVE mg/dL   Protein, ur NEGATIVE NEGATIVE mg/dL   Nitrite NEGATIVE NEGATIVE   Leukocytes,Ua NEGATIVE NEGATIVE    Comment: Performed at [facility], [address], [place], [place] [address]  Urine drugs of abuse scrn w alc, routine (Ref Lab)     Status: None   Collection Time: 12/18/22  5:33 PM  Result Value Ref Range   Amphetamines, Urine Negative Cutoff=1000 ng/mL    Comment: Amphetamine test includes Amphetamine and Methamphetamine.   Barbiturate, Ur Negative Cutoff=300 ng/mL   Benzodiazepine Quant, Ur Negative Cutoff=300 ng/mL   Cannabinoid Quant, Ur Negative Cutoff=50 ng/mL   Cocaine (Metab.) Negative Cutoff=300 ng/mL   Opiate Quant, Ur Negative Cutoff=300 ng/mL    Comment: Opiate test includes Codeine  and Morphine  only.   Phencyclidine, Ur Negative Cutoff=25 ng/mL   Methadone Screen, Urine Negative Cutoff=300 ng/mL   Propoxyphene, Urine Negative Cutoff=300 ng/mL   Ethanol U, Quan Negative Cutoff=0.020 %    Comment: (NOTE) Performed At: [facility] [address] [place], [place] [id number] Forrest Concha PhD [id number]   Basic metabolic panel     Status: Abnormal   Collection Time: 01/10/23 11:43 AM  Result Value Ref Range   Sodium 134 (L) 135 - 145 mmol/L   Potassium 3.5 3.5 - 5.1 mmol/L   Chloride 104 98 - 111 mmol/L   CO2 19 (L) 22 - 32 mmol/L   Glucose, Bld 100 (H) 70 - 99 mg/dL    Comment: Glucose reference range applies only to samples taken after fasting for at least 8 hours.   BUN 7 6 - 20 mg/dL   Creatinine, Ser 9.05 0.44 - 1.00 mg/dL   Calcium  8.2 (L) 8.9 - 10.3 mg/dL   GFR, Estimated >39 >39 mL/min  Comment: (NOTE) Calculated using the CKD-EPI Creatinine Equation (2021)    Anion gap 11 5 - 15    Comment: Performed at [facility], [address] [place], [place] [address]  CBC     Status: Abnormal   Collection Time: 01/10/23 11:43 AM  Result Value Ref Range   WBC 12.5 (H) 4.0 - 10.5 K/uL   RBC 4.25 3.87 - 5.11 MIL/uL   Hemoglobin 13.2 12.0 - 15.0 g/dL   HCT 59.0 63.9 - 53.9 %   MCV 96.2 80.0 - 100.0 fL   MCH 31.1 26.0 - 34.0 pg   MCHC 32.3 30.0 - 36.0 g/dL   RDW 86.8 88.4 - 84.4 %   Platelets 214 150 - 400 K/uL   nRBC 0.0 0.0 - 0.2 %    Comment: Performed at [facility], [address], [place], [place] [address]  Troponin I (High Sensitivity)     Status: None   Collection Time: 01/10/23 11:43 AM  Result Value Ref Range   Troponin I (High Sensitivity) 3 <18 ng/L    Comment: (NOTE) Elevated high sensitivity troponin I (hsTnI) values and significant  changes across serial measurements may suggest ACS but many other  chronic and acute conditions are known to elevate hsTnI results.  Refer to the Links section for chest pain algorithms and additional  guidance. Performed at [facility], [address] [place], [place] [address]     Assessment & Plan  Assessment and Plan    Right Middle Lobe Pneumonia Presented with chest pain and difficulty breathing. Treated with Augmentin  for 7 days and Azithromycin  for 3 days. Currently feeling lethargic and experiencing difficulty breathing. -Complete antibiotic course. -Order CBC and comprehensive panel to assess response to antibiotics. -Order chest x-ray in one month to confirm resolution of pneumonia. -Encourage rest, hydration, and light activity to promote lung expansion. -Continue use of CPAP machine as it aids in breathing. -Follow-up appointment later this month.  Supraventricular Tachycardia Detected on Zio patch monitor with heart rate up to 207. Not associated with any high-risk features. -Refer to cardiology for further evaluation and management. -Keep scheduled cardiology appointment next month.  Work Excuse Patient  has been out of work since 01/10/2023 due to illness. -Provide work excuse from 01/10/2023 to 01/22/2023.  General Health Maintenance -Check on pneumonia vaccine status at next visit.

## 2023-01-16 LAB — COMPLETE METABOLIC PANEL WITH GFR
AG Ratio: 1.8 (calc) (ref 1.0–2.5)
ALT: 12 U/L (ref 6–29)
AST: 6 U/L — ABNORMAL LOW (ref 10–30)
Albumin: 3.5 g/dL — ABNORMAL LOW (ref 3.6–5.1)
Alkaline phosphatase (APISO): 52 U/L (ref 31–125)
BUN: 10 mg/dL (ref 7–25)
CO2: 29 mmol/L (ref 20–32)
Calcium: 8.9 mg/dL (ref 8.6–10.2)
Chloride: 107 mmol/L (ref 98–110)
Creat: 0.92 mg/dL (ref 0.50–0.99)
Globulin: 2 g/dL (ref 1.9–3.7)
Glucose, Bld: 83 mg/dL (ref 65–99)
Potassium: 5 mmol/L (ref 3.5–5.3)
Sodium: 142 mmol/L (ref 135–146)
Total Bilirubin: 0.2 mg/dL (ref 0.2–1.2)
Total Protein: 5.5 g/dL — ABNORMAL LOW (ref 6.1–8.1)
eGFR: 79 mL/min/{1.73_m2} (ref >=60)

## 2023-01-21 ENCOUNTER — Encounter: Payer: Self-pay | Admitting: Family Medicine

## 2023-01-22 ENCOUNTER — Other Ambulatory Visit: Payer: Self-pay | Admitting: Family Medicine

## 2023-01-22 ENCOUNTER — Ambulatory Visit (INDEPENDENT_AMBULATORY_CARE_PROVIDER_SITE_OTHER): Payer: 59 | Admitting: Family Medicine

## 2023-01-22 ENCOUNTER — Encounter: Payer: Self-pay | Admitting: Family Medicine

## 2023-01-22 ENCOUNTER — Ambulatory Visit
Admission: RE | Admit: 2023-01-22 | Discharge: 2023-01-22 | Disposition: A | Discharge status: Home / Self Care | Payer: 59 | Attending: Family Medicine | Admitting: Family Medicine

## 2023-01-22 ENCOUNTER — Ambulatory Visit: Payer: 59 | Admitting: Professional Counselor

## 2023-01-22 ENCOUNTER — Ambulatory Visit
Admission: RE | Admit: 2023-01-22 | Discharge: 2023-01-22 | Disposition: A | Discharge status: Home / Self Care | Payer: 59 | Source: Ambulatory Visit | Attending: Family Medicine | Admitting: Family Medicine

## 2023-01-22 VITALS — BP 134/82 | HR 111 | Temp 98.2°F | Resp 21 | Ht 65.0 in | Wt 311.3 lb

## 2023-01-22 DIAGNOSIS — J189 Pneumonia, unspecified organism: Secondary | ICD-10-CM | POA: Insufficient documentation

## 2023-01-22 DIAGNOSIS — J4531 Mild persistent asthma with (acute) exacerbation: Secondary | ICD-10-CM

## 2023-01-22 DIAGNOSIS — R0602 Shortness of breath: Secondary | ICD-10-CM | POA: Diagnosis not present

## 2023-01-22 DIAGNOSIS — R000 Tachycardia, unspecified: Secondary | ICD-10-CM | POA: Diagnosis not present

## 2023-01-22 DIAGNOSIS — R052 Subacute cough: Secondary | ICD-10-CM

## 2023-01-22 MED ORDER — METHYLPREDNISOLONE 4 MG PO TBPK: ORAL_TABLET | ORAL | 0 refills | Status: DC

## 2023-01-22 MED ORDER — AIRSUPRA 90-80 MCG/ACT IN AERO: 2.0000 | INHALATION_SPRAY | Freq: Four times a day (QID) | RESPIRATORY_TRACT | 0 refills | Status: DC

## 2023-01-22 NOTE — Telephone Encounter (Signed)
 Requested medication (s) are due for refill today -no  Requested medication (s) are on the active medication list -yes  Future visit scheduled -yes  Last refill: 01/22/23  Notes to clinic: Pharmacy comment: Alternative Requested:NOT COVERED. CHANGE TO AN ALTERNATIVE.   Requested Prescriptions  Pending Prescriptions Disp Refills   AIRSUPRA  90-80 MCG/ACT AERO [Pharmacy Med Name: AIRSUPRA  90-80 MCG INHALER]  0    Sig: INHALE 2 PUFFS INTO THE LUNGS 4 TIMES DAILY.     Off-Protocol Failed - 01/22/2023 10:07 AM      Failed - Medication not assigned to a protocol, review manually.      Passed - Valid encounter within last 12 months    Recent Outpatient Visits           Today Mild persistent asthma with exacerbation   [facility] Glenard Mire, MD   1 week ago Community acquired pneumonia of right middle lobe of lung   [facility] Glenard Mire, MD   1 month ago Chest pain, unspecified type   [facility] Gareth Mliss FALCON, FNP   2 months ago Well adult exam   [facility] Glenard Mire, MD   2 months ago Morbid obesity with BMI of 50.0-59.9, adult [facility])   [facility] Glenard Mire, MD       Future Appointments             In 2 days Mecum, Erin E, PA-C [place] [facility]   In 1 week Sowles, Krichna, MD [facility], [facility]   In 3 weeks Alvan, Ronal BRAVO, MD [facility]   In 9 months Sowles, Krichna, MD [facility], [facility]               Requested Prescriptions  Pending Prescriptions Disp Refills   AIRSUPRA  90-80 MCG/ACT AERO [Pharmacy Med Name: AIRSUPRA  90-80 MCG INHALER]  0    Sig: INHALE 2 PUFFS INTO THE LUNGS 4 TIMES DAILY.     Off-Protocol Failed - 01/22/2023 10:07 AM      Failed - Medication not assigned to a protocol, review  manually.      Passed - Valid encounter within last 12 months    Recent Outpatient Visits           Today Mild persistent asthma with exacerbation   [facility] Glenard Mire, MD   1 week ago Community acquired pneumonia of right middle lobe of lung   [facility] Glenard Mire, MD   1 month ago Chest pain, unspecified type   [facility] Gareth Mliss FALCON, FNP   2 months ago Well adult exam   [facility] Glenard Mire, MD   2 months ago Morbid obesity with BMI of 50.0-59.9, adult [facility])   [facility] Glenard Mire, MD       Future Appointments             In 2 days Mecum, Erin E, PA-C [place] [facility]   In 1 week Sowles, Krichna, MD [facility], [facility]   In 3 weeks Branch, Ronal BRAVO, MD [facility]   In 9 months Sowles, Krichna, MD [facility], [facility]

## 2023-01-22 NOTE — Progress Notes (Signed)
 Name: Barbara Pennington   MRN: [id number]    DOB: 03/31/1980   Date:01/22/2023       Progress Note  Subjective  Chief Complaint  Chief Complaint  Patient presents with   Cough    Dry cough, sx not any better   Wheezing   Form Completion    FMLA   Discussed the use of AI scribe software for clinical note transcription with the patient, who gave verbal consent to proceed.  History of Present Illness   The patient, with a recent history of right middle lobe pneumonia, presents for her third visit with persistent respiratory symptoms. Despite completing a course of Augmentin  and a three-day course of Azithromycin , the patient reports an inability to breathe, sleep disturbances, and non-stop coughing. She describes her breathing as labored, with a sensation of a 'web of tar' in her lungs and audible wheezing. The patient has been using hot showers, a puffer, a nebulizer, Coricidin, Mucinex , Tylenol , and ibuprofen  to manage her symptoms.  Over the past two days, the patient's condition has worsened, with increased difficulty in catching her breath and persistent coughing without expectoration. The patient also reports an increased heart rate and feelings of desperation.  The patient has been using a CPAP machine to aid in breathing due to persistent shortness of breath. She has been monitoring her oxygen saturation at home, which has been primarily between 91 and 92 percent. The patient denies any pain in her calves.  The patient's work has been affected by her illness, with her last day of work being January 2nd. She expresses a need for something to help her function and a desire to understand her condition better. The patient has a history of asthma, which she believes could be contributing to her current symptoms.       Patient Active Problem List   Diagnosis Date Noted   High risk medication use 12/17/2022   Chronic cough 02/13/2022   Skin-picking disorder 12/19/2021   Adjustment disorder  with depressed mood 06/13/2021   Closed nondisplaced longitudinal fracture of right patella 06/09/2021   GAD (generalized anxiety disorder) 05/05/2021   Depressive disorder 05/05/2021   History of borderline personality disorder 05/05/2021   History of alcohol abuse 05/05/2021   Internal derangement of left knee 04/03/2021   Primary osteoarthritis of left knee 04/03/2021   Metabolic syndrome 03/24/2021   Impaired fasting glucose 12/06/2020   Lymphedema 12/06/2020   Moderate episode of recurrent major depressive disorder (HCC) 02/08/2020   CKD (chronic kidney disease) stage 3, GFR 30-59 ml/min (HCC) 06/11/2019   PTSD (post-traumatic stress disorder) 01/14/2019   Borderline personality disorder (HCC) 01/14/2019   Anxiety 01/14/2019   Mixed hyperlipidemia 01/14/2019   Morbid obesity with BMI of 50.0-59.9, adult (HCC) 01/14/2019   OSA on CPAP 01/14/2019   Asthma, mild persistent 04/30/2018   Low back pain 08/20/2017   Fibroadenoma of breast, 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   Essential hypertension     Past Surgical History:  Procedure Laterality Date   ABDOMINAL HYSTERECTOMY  01/08/2006   BREAST BIOPSY Left 01/09/2011   CORE - FIBROADENOMA VS PHYLLODES   CESAREAN SECTION  01/08/2005   COLONOSCOPY WITH PROPOFOL  N/A 10/09/2016   Procedure: COLONOSCOPY WITH PROPOFOL ;  Surgeon: Therisa Bi, MD;  [facility] END[facility];  Service: Gastroenterology;  Laterality: N/A;   COLONOSCOPY WITH PROPOFOL  N/A 11/09/2019   Procedure: COLONOSCOPY WITH PROPOFOL ;  Surgeon: Unk Corinn Skiff, MD;  Location: [facility] ENDOSCOPY;  Service: Gastroenterology;  Laterality: N/A;   ESOPHAGOGASTRODUODENOSCOPY  GUM SURGERY  01/08/2002   KNEE ARTHROSCOPY WITH MEDIAL MENISECTOMY Right 07/24/2021   Procedure: Right medial meniscus root repair and patella chondroplasty;  Surgeon: Tobie Priest, MD;  Location: [facility] ORS;  Service: Orthopedics;  Laterality: Right;   PLANTAR FASCIA RELEASE Left 08/19/2014   Procedure: ENDOSCOPIC  PLANTAR FASCIOTOMY RELEASE L WITH TOPAZ;  Surgeon: Donnice Cory, DPM;  Location: [facility];  Service: Podiatry;  Laterality: Left;  LMA WITH POPLITEAL BLOCK   TONSILLECTOMY  01/08/1982    Family History  Problem Relation Age of Onset   Obesity Mother    Diabetes Mother    High Cholesterol Mother    Hypertension Mother    Neuropathy Mother    Depression Mother    Anxiety disorder Mother    Obesity Father    Hypertension Father    High Cholesterol Father    Depression Father    Anxiety disorder Father    Seizures Brother    Testicular cancer Brother    Breast cancer Maternal Grandmother    Hypertension Maternal Grandmother    Aneurysm Maternal Grandmother    Stroke Maternal Grandmother    Hypertension Maternal Grandfather    High Cholesterol Maternal Grandfather    Heart disease Maternal Grandfather        has a pacemaker   Stroke Maternal Grandfather    Alzheimer's disease Paternal Grandmother    Dementia Paternal Grandmother    Colon cancer Paternal Grandfather    Mental illness Maternal Aunt    Suicidality Cousin     Social History   Tobacco Use   Smoking status: Former    Current packs/day: 0.00    Average packs/day: 0.3 packs/day for 10.0 years (2.5 ttl pk-yrs)    Types: Cigarettes    Start date: 07/04/2004    Quit date: 07/05/2014    Years since quitting: 8.5   Smokeless tobacco: Never  Substance Use Topics   Alcohol use: Yes    Comment: rare     Current Outpatient Medications:    acetaminophen  (TYLENOL ) 500 MG tablet, Take 1,000 mg by mouth every 6 (six) hours as needed for moderate pain (pain score 4-6)., Disp: , Rfl:    Albuterol -Budesonide  (AIRSUPRA ) 90-80 MCG/ACT AERO, Inhale 2 puffs into the lungs 4 (four) times daily., Disp: 10.7 g, Rfl: 0   amLODipine  (NORVASC ) 2.5 MG tablet, Take 1 tablet (2.5 mg total) by mouth every morning., Disp: 90 tablet, Rfl: 1   atorvastatin  (LIPITOR) 20 MG tablet, Take 1 tablet (20 mg total) by mouth every  morning., Disp: 90 tablet, Rfl: 1   buPROPion  (WELLBUTRIN  XL) 150 MG 24 hr tablet, Take 1 tablet (150 mg total) by mouth daily with breakfast. Stop wellbutrin  xl 300 mg, Disp: 90 tablet, Rfl: 0   busPIRone  (BUSPAR ) 30 MG tablet, Take 1 tablet (30 mg total) by mouth 2 (two) times daily., Disp: 180 tablet, Rfl: 1   desloratadine  (CLARINEX ) 5 MG tablet, Take 1 tablet (5 mg total) by mouth daily., Disp: 90 tablet, Rfl: 1   escitalopram  (LEXAPRO ) 20 MG tablet, Take 1 tablet (20 mg total) by mouth daily., Disp: 90 tablet, Rfl: 1   fluticasone  (FLONASE ) 50 MCG/ACT nasal spray, Place 1 spray into both nostrils daily., Disp: 48 mL, Rfl: 1   Melatonin 10 MG CAPS, Take 10-20 mg by mouth at bedtime., Disp: , Rfl:    meloxicam  (MOBIC ) 15 MG tablet, Take 15 mg by mouth daily., Disp: , Rfl:    methylPREDNISolone  (MEDROL  DOSEPAK) 4 MG TBPK tablet,  Take as directed, Disp: 21 tablet, Rfl: 0   montelukast  (SINGULAIR ) 10 MG tablet, Take 1 tablet (10 mg total) by mouth at bedtime., Disp: 90 tablet, Rfl: 1   Semaglutide -Weight Management (WEGOVY ) 2.4 MG/0.75ML SOAJ, Inject 2.4 mg into the skin once a week., Disp: 18 mL, Rfl: 0   Solriamfetol  HCl (SUNOSI ) 75 MG TABS, Take 1 tablet (75 mg total) by mouth every morning., Disp: 90 tablet, Rfl: 0   valsartan -hydrochlorothiazide  (DIOVAN -HCT) 320-12.5 MG tablet, Take 1 tablet by mouth daily., Disp: 90 tablet, Rfl: 0   clonazePAM  (KLONOPIN ) 0.5 MG tablet, Take 1 tablet (0.5 mg total) by mouth daily as needed for anxiety. Please limit use, Disp: 15 tablet, Rfl: 0   gabapentin  (NEURONTIN ) 300 MG capsule, Take 1 capsule by mouth 3 (three) times daily., Disp: , Rfl:   Allergies  Allergen Reactions   Latex Rash   Metformin And Related     diarrhea    I personally reviewed active problem list, medication list, allergies, family history with the patient/caregiver today.   ROS  Ten systems reviewed and is negative except as mentioned in HPI    Objective  Vitals:    01/22/23 0819  BP: 134/82  Pulse: (!) 111  Resp: (!) 21  Temp: 98.2 F (36.8 C)  TempSrc: Oral  SpO2: 97%  Weight: (!) 311 lb 4.8 oz (141.2 kg)  Height: 5' 5 (1.651 m)    Body mass index is 51.8 kg/m.  Physical Exam  Constitutional: Patient appears well-developed and well-nourished. Obese  No distress.  HEENT: head atraumatic, normocephalic, pupils equal and reactive to light, neck supple, throat within normal limits Cardiovascular: Normal rate, regular rhythm and normal heart sounds.  No murmur heard. No BLE edema. Pulmonary/Chest: labored breathing and expiratory wheezing - audible  Abdominal: Soft.  There is no tenderness. Psychiatric: Patient has a normal mood and affect. behavior is normal. Judgment and thought content normal.   Recent Results (from the past 2160 hours)  Urine cytology ancillary only     Status: None   Collection Time: 11/08/22  3:10 PM  Result Value Ref Range   Neisseria Gonorrhea Negative    Chlamydia Negative    Comment Normal Reference Ranger Chlamydia - Negative    Comment      Normal Reference Range Neisseria Gonorrhea - Negative  CBC with Differential/Platelet     Status: Abnormal   Collection Time: 11/08/22  3:32 PM  Result Value Ref Range   WBC 12.4 (H) 3.8 - 10.8 Thousand/uL   RBC 4.39 3.80 - 5.10 Million/uL   Hemoglobin 13.6 11.7 - 15.5 g/dL   HCT 58.0 64.9 - 54.9 %   MCV 95.4 80.0 - 100.0 fL   MCH 31.0 27.0 - 33.0 pg   MCHC 32.5 32.0 - 36.0 g/dL    Comment: For adults, a slight decrease in the calculated MCHC value (in the range of 30 to 32 g/dL) is most likely not clinically significant; however, it should be interpreted with caution in correlation with other red cell parameters and the patient's clinical condition.    RDW 12.5 11.0 - 15.0 %   Platelets 308 140 - 400 Thousand/uL   MPV 9.9 7.5 - 12.5 fL   Neutro Abs 8,134 (H) 1,500 - 7,800 cells/uL   Absolute Lymphocytes 3,125 850 - 3,900 cells/uL   Absolute Monocytes 794 200 -  950 cells/uL   Eosinophils Absolute 260 15 - 500 cells/uL   Basophils Absolute 87 0 - 200 cells/uL  Neutrophils Relative % 65.6 %   Total Lymphocyte 25.2 %   Monocytes Relative 6.4 %   Eosinophils Relative 2.1 %   Basophils Relative 0.7 %  COMPLETE METABOLIC PANEL WITH GFR     Status: Abnormal   Collection Time: 11/08/22  3:32 PM  Result Value Ref Range   Glucose, Bld 83 65 - 99 mg/dL    Comment: .            Fasting reference interval .    BUN 12 7 - 25 mg/dL   Creat 8.87 (H) 9.49 - 0.99 mg/dL   eGFR 63 > OR = 60 fO/fpw/8.26f7   BUN/Creatinine Ratio 11 6 - 22 (calc)   Sodium 142 135 - 146 mmol/L   Potassium 4.4 3.5 - 5.3 mmol/L   Chloride 102 98 - 110 mmol/L   CO2 32 20 - 32 mmol/L   Calcium  9.3 8.6 - 10.2 mg/dL   Total Protein 5.8 (L) 6.1 - 8.1 g/dL   Albumin 3.9 3.6 - 5.1 g/dL   Globulin 1.9 1.9 - 3.7 g/dL (calc)   AG Ratio 2.1 1.0 - 2.5 (calc)   Total Bilirubin 0.3 0.2 - 1.2 mg/dL   Alkaline phosphatase (APISO) 45 31 - 125 U/L   AST 8 (L) 10 - 30 U/L   ALT 17 6 - 29 U/L  Lipid panel     Status: Abnormal   Collection Time: 11/08/22  3:32 PM  Result Value Ref Range   Cholesterol 147 <200 mg/dL   HDL 47 (L) > OR = 50 mg/dL   Triglycerides 899 <849 mg/dL   LDL Cholesterol (Calc) 80 mg/dL (calc)    Comment: Reference range: <100 . Desirable range <100 mg/dL for primary prevention;   <70 mg/dL for patients with CHD or diabetic patients  with > or = 2 CHD risk factors. SABRA LDL-C is now calculated using the Martin-Hopkins  calculation, which is a validated novel method providing  better accuracy than the Friedewald equation in the  estimation of LDL-C.  Gladis APPLETHWAITE et al. SANDREA. 7986;689(80): 2061-2068  (http://education.[facility].com/faq/FAQ164)    Total CHOL/HDL Ratio 3.1 <5.0 (calc)   Non-HDL Cholesterol (Calc) 100 <130 mg/dL (calc)    Comment: For patients with diabetes plus 1 major ASCVD risk  factor, treating to a non-HDL-C goal of <100 mg/dL  (LDL-C of  <29 mg/dL) is considered a therapeutic  option.   Hemoglobin A1c     Status: None   Collection Time: 11/08/22  3:32 PM  Result Value Ref Range   Hgb A1c MFr Bld 5.2 <5.7 % of total Hgb    Comment: For the purpose of screening for the presence of diabetes: . <5.7%       Consistent with the absence of diabetes 5.7-6.4%    Consistent with increased risk for diabetes             (prediabetes) > or =6.5%  Consistent with diabetes . This assay result is consistent with a decreased risk of diabetes. . Currently, no consensus exists regarding use of hemoglobin A1c for diagnosis of diabetes in children. . According to American Diabetes Association (ADA) guidelines, hemoglobin A1c <7.0% represents optimal control in non-pregnant diabetic patients. Different metrics may apply to specific patient populations.  Standards of Medical Care in Diabetes(ADA). .    Mean Plasma Glucose 103 mg/dL   eAG (mmol/L) 5.7 mmol/L  VITAMIN D  25 Hydroxy (Vit-D Deficiency, Fractures)     Status: None   Collection Time: 11/08/22  3:32 PM  Result Value Ref Range   Vit D, 25-Hydroxy 37 30 - 100 ng/mL    Comment: Vitamin D  Status         25-OH Vitamin D : . Deficiency:                    <20 ng/mL Insufficiency:             20 - 29 ng/mL Optimal:                 > or = 30 ng/mL . For 25-OH Vitamin D  testing on patients on  D2-supplementation and patients for whom quantitation  of D2 and D3 fractions is required, the QuestAssureD(TM) 25-OH VIT D, (D2,D3), LC/MS/MS is recommended: order  code 07111 (patients >62yrs). . See Note 1 . Note 1 . For additional information, please refer to  http://education.[facility].com/faq/FAQ199  (This link is being provided for informational/ educational purposes only.)   Parathyroid  hormone, intact (no Ca)     Status: None   Collection Time: 11/08/22  3:32 PM  Result Value Ref Range   PTH 67 16 - 77 pg/mL    Comment: . Interpretive Guide    Intact PTH            Calcium  ------------------    ----------           ------- Normal Parathyroid     Normal               Normal Hypoparathyroidism    Low or Low Normal    Low Hyperparathyroidism    Primary            Normal or High       High    Secondary          High                 Normal or Low    Tertiary           High                 High Non-Parathyroid     Hypercalcemia      Low or Low Normal    High .   B12 and Folate Panel     Status: Abnormal   Collection Time: 11/08/22  3:32 PM  Result Value Ref Range   Vitamin B-12 309 200 - 1,100 pg/mL    Comment: . Please Note: Although the reference range for vitamin B12 is [phone] pg/mL, it has been reported that between 5 and 10% of patients with values between 200 and 400 pg/mL may experience neuropsychiatric and hematologic abnormalities due to occult B12 deficiency; less than 1% of patients with values above 400 pg/mL will have symptoms. .    Folate 4.3 (L) ng/mL    Comment:                            Reference Range                            Low:           <3.4                            Borderline:    3.4-5.4  Normal:        >5.4 .   HIV Antibody (routine testing w rflx)     Status: None   Collection Time: 11/08/22  3:32 PM  Result Value Ref Range   HIV 1&2 Ab, 4th Generation NON-REACTIVE NON-REACTIVE    Comment: HIV-1 antigen and HIV-1/HIV-2 antibodies were not detected. There is no laboratory evidence of HIV infection. SABRA PLEASE NOTE: This information has been disclosed to you from records whose confidentiality may be protected by state law.  If your state requires such protection, then the state law prohibits you from making any further disclosure of the information without the specific written consent of the person to whom it pertains, or as otherwise permitted by law. A general authorization for the release of medical or other information is NOT sufficient for this purpose. . For additional  information please refer to http://education.[facility].com/faq/FAQ106 (This link is being provided for informational/ educational purposes only.) . SABRA The performance of this assay has not been clinically validated in patients less than 8 years old. .   RPR     Status: None   Collection Time: 11/08/22  3:32 PM  Result Value Ref Range   RPR Ser Ql NON-REACTIVE NON-REACTIVE    Comment: . No laboratory evidence of syphilis. If recent exposure is suspected, submit a new sample in 2-4 weeks. .   CBG monitoring, ED     Status: Abnormal   Collection Time: 12/12/22  8:28 AM  Result Value Ref Range   Glucose-Capillary 132 (H) 70 - 99 mg/dL    Comment: Glucose reference range applies only to samples taken after fasting for at least 8 hours.  Basic metabolic panel     Status: Abnormal   Collection Time: 12/12/22  8:31 AM  Result Value Ref Range   Sodium 139 135 - 145 mmol/L   Potassium 3.4 (L) 3.5 - 5.1 mmol/L   Chloride 105 98 - 111 mmol/L   CO2 25 22 - 32 mmol/L   Glucose, Bld 131 (H) 70 - 99 mg/dL    Comment: Glucose reference range applies only to samples taken after fasting for at least 8 hours.   BUN 12 6 - 20 mg/dL   Creatinine, Ser 8.85 (H) 0.44 - 1.00 mg/dL   Calcium  9.0 8.9 - 10.3 mg/dL   GFR, Estimated >39 >39 mL/min    Comment: (NOTE) Calculated using the CKD-EPI Creatinine Equation (2021)    Anion gap 9 5 - 15    Comment: Performed at [facility], [address], [place], [place] [address]  CBC     Status: None   Collection Time: 12/12/22  8:31 AM  Result Value Ref Range   WBC 10.0 4.0 - 10.5 K/uL   RBC 4.71 3.87 - 5.11 MIL/uL   Hemoglobin 14.6 12.0 - 15.0 g/dL   HCT 55.6 63.9 - 53.9 %   MCV 94.1 80.0 - 100.0 fL   MCH 31.0 26.0 - 34.0 pg   MCHC 33.0 30.0 - 36.0 g/dL   RDW 87.2 88.4 - 84.4 %   Platelets 277 150 - 400 K/uL   nRBC 0.0 0.0 - 0.2 %    Comment: Performed at [facility], [address], [place], [place] [address]  Troponin I  (High Sensitivity)     Status: None   Collection Time: 12/12/22  8:31 AM  Result Value Ref Range   Troponin I (High Sensitivity) 3 <18 ng/L    Comment: (NOTE) Elevated high  sensitivity troponin I (hsTnI) values and significant  changes across serial measurements may suggest ACS but many other  chronic and acute conditions are known to elevate hsTnI results.  Refer to the Links section for chest pain algorithms and additional  guidance. Performed at [facility], [address] [place], [place] [address]   Urinalysis, Routine w reflex microscopic -Urine, Clean Catch     Status: None   Collection Time: 12/12/22  9:42 AM  Result Value Ref Range   Color, Urine YELLOW YELLOW   APPearance CLEAR CLEAR   Specific Gravity, Urine 1.025 1.005 - 1.030   pH 5.0 5.0 - 8.0   Glucose, UA NEGATIVE NEGATIVE mg/dL   Hgb urine dipstick NEGATIVE NEGATIVE   Bilirubin Urine NEGATIVE NEGATIVE   Ketones, ur NEGATIVE NEGATIVE mg/dL   Protein, ur NEGATIVE NEGATIVE mg/dL   Nitrite NEGATIVE NEGATIVE   Leukocytes,Ua NEGATIVE NEGATIVE    Comment: Performed at [facility], [address] [place], [place] [address]  Urine drugs of abuse scrn w alc, routine (Ref Lab)     Status: None   Collection Time: 12/18/22  5:33 PM  Result Value Ref Range   Amphetamines, Urine Negative Cutoff=1000 ng/mL    Comment: Amphetamine test includes Amphetamine and Methamphetamine.   Barbiturate, Ur Negative Cutoff=300 ng/mL   Benzodiazepine Quant, Ur Negative Cutoff=300 ng/mL   Cannabinoid Quant, Ur Negative Cutoff=50 ng/mL   Cocaine (Metab.) Negative Cutoff=300 ng/mL   Opiate Quant, Ur Negative Cutoff=300 ng/mL    Comment: Opiate test includes Codeine  and Morphine  only.   Phencyclidine, Ur Negative Cutoff=25 ng/mL   Methadone Screen, Urine Negative Cutoff=300 ng/mL   Propoxyphene, Urine Negative Cutoff=300 ng/mL   Ethanol U, Quan Negative Cutoff=0.020 %    Comment: (NOTE) Performed At: [facility]  [facility] [address] [place], [place] [id number] Forrest Concha PhD [id number]   Basic metabolic panel     Status: Abnormal   Collection Time: 01/10/23 11:43 AM  Result Value Ref Range   Sodium 134 (L) 135 - 145 mmol/L   Potassium 3.5 3.5 - 5.1 mmol/L   Chloride 104 98 - 111 mmol/L   CO2 19 (L) 22 - 32 mmol/L   Glucose, Bld 100 (H) 70 - 99 mg/dL    Comment: Glucose reference range applies only to samples taken after fasting for at least 8 hours.   BUN 7 6 - 20 mg/dL   Creatinine, Ser 9.05 0.44 - 1.00 mg/dL   Calcium  8.2 (L) 8.9 - 10.3 mg/dL   GFR, Estimated >39 >39 mL/min    Comment: (NOTE) Calculated using the CKD-EPI Creatinine Equation (2021)    Anion gap 11 5 - 15    Comment: Performed at [facility], [address] [place], [place] [address]  CBC     Status: Abnormal   Collection Time: 01/10/23 11:43 AM  Result Value Ref Range   WBC 12.5 (H) 4.0 - 10.5 K/uL   RBC 4.25 3.87 - 5.11 MIL/uL   Hemoglobin 13.2 12.0 - 15.0 g/dL   HCT 59.0 63.9 - 53.9 %   MCV 96.2 80.0 - 100.0 fL   MCH 31.1 26.0 - 34.0 pg   MCHC 32.3 30.0 - 36.0 g/dL   RDW 86.8 88.4 - 84.4 %   Platelets 214 150 - 400 K/uL   nRBC 0.0 0.0 - 0.2 %    Comment: Performed at [facility], [address], [place], [place] [address]  Troponin I (High Sensitivity)  Status: None   Collection Time: 01/10/23 11:43 AM  Result Value Ref Range   Troponin I (High Sensitivity) 3 <18 ng/L    Comment: (NOTE) Elevated high sensitivity troponin I (hsTnI) values and significant  changes across serial measurements may suggest ACS but many other  chronic and acute conditions are known to elevate hsTnI results.  Refer to the Links section for chest pain algorithms and additional  guidance. Performed at [facility], [address] [place], [place] [address]   COMPLETE METABOLIC PANEL WITH GFR     Status: Abnormal   Collection Time: 01/15/23 11:13 AM  Result Value Ref Range   Glucose, Bld 83  65 - 99 mg/dL    Comment: .            Fasting reference interval .    BUN 10 7 - 25 mg/dL   Creat 9.07 9.49 - 9.00 mg/dL   eGFR 79 > OR = 60 fO/fpw/8.26f7   BUN/Creatinine Ratio SEE NOTE: 6 - 22 (calc)    Comment:    Not Reported: BUN and Creatinine are within    reference range. .    Sodium 142 135 - 146 mmol/L   Potassium 5.0 3.5 - 5.3 mmol/L   Chloride 107 98 - 110 mmol/L   CO2 29 20 - 32 mmol/L   Calcium  8.9 8.6 - 10.2 mg/dL   Total Protein 5.5 (L) 6.1 - 8.1 g/dL   Albumin 3.5 (L) 3.6 - 5.1 g/dL   Globulin 2.0 1.9 - 3.7 g/dL (calc)   AG Ratio 1.8 1.0 - 2.5 (calc)   Total Bilirubin 0.2 0.2 - 1.2 mg/dL   Alkaline phosphatase (APISO) 52 31 - 125 U/L   AST 6 (L) 10 - 30 U/L   ALT 12 6 - 29 U/L    Diabetic Foot Exam:     PHQ2/9:    01/15/2023   10:25 AM 01/10/2023    8:05 AM 12/17/2022    1:10 PM 11/08/2022    2:57 PM 10/30/2022    3:30 PM  Depression screen PHQ 2/9  Decreased Interest 0  0 0 0  Down, Depressed, Hopeless 0  0 0 0  PHQ - 2 Score 0  0 0 0  Altered sleeping 0  0 0 3  Tired, decreased energy 0  0 0 3  Change in appetite 0  0 0 0  Feeling bad or failure about yourself  0  0 0 0  Trouble concentrating 0  0 0 2  Moving slowly or fidgety/restless 0  0 0 0  Suicidal thoughts 0  0 0 0  PHQ-9 Score 0  0 0 8  Difficult doing work/chores Not difficult at all   Not difficult at all      Information is confidential and restricted. Go to Review Flowsheets to unlock data.    phq 9 is negative   Fall Risk:    01/15/2023   10:25 AM 12/17/2022    1:09 PM 11/08/2022    2:57 PM 10/30/2022    3:30 PM 09/21/2022    7:49 AM  Fall Risk   Falls in the past year? 1 1 0 1 0  Number falls in past yr: 0 0 0 0 0  Injury with Fall? 0 0 0 0   Risk for fall due to : Impaired balance/gait History of fall(s)  No Fall Risks No Fall Risks  Follow up Education provided;Falls evaluation completed;Falls prevention discussed Falls prevention discussed;Education provided;Falls  evaluation completed  Falls prevention discussed     Assessment and Plan    Asthma exacerbation Increased wheezing, coughing, and shortness of breath despite use of albuterol  inhaler and nebulizer. Recent history of pneumonia treated with Augmentin  and azithromycin . -Start prednisone  taper to reduce inflammation. -Replace albuterol  with Airsuppra  (fluticasone  propionate and salmeterol) inhaler. Rinse mouth after use. -If no improvement within 24 hours, consider CT chest to rule out other causes. She denies calves pain.  -If oxygen saturation drops below 90%, patient advised to go to the emergency room.  Recent Pneumonia Treated with Augmentin  and azithromycin . Concern for persistent symptoms. -Order chest x-ray to assess for resolution or progression. -Order CBC, renal function tests, and CRP to assess for ongoing infection or inflammation.  Referral to Pulmonology Ongoing respiratory symptoms for over a month. -Refer to pulmonology for further evaluation and management.  Work leave due to illness Patient unable to work due to illness. -Complete FMLA paperwork to keep her off from January 2nd until Feb 17 th 2025  weeks off work. Can be adjusted as needed based on patient's recovery.  Follow-up in 48 hours to assess response to treatment and review test results.

## 2023-01-23 ENCOUNTER — Encounter: Payer: Self-pay | Admitting: Family Medicine

## 2023-01-23 LAB — CBC WITH DIFFERENTIAL/PLATELET
Absolute Lymphocytes: 2822 {cells}/uL (ref 850–3900)
Absolute Monocytes: 581 {cells}/uL (ref 200–950)
Basophils Absolute: 91 {cells}/uL (ref 0–200)
Basophils Relative: 1.1 %
Eosinophils Absolute: 681 {cells}/uL — ABNORMAL HIGH (ref 15–500)
Eosinophils Relative: 8.2 %
HCT: 41 % (ref 35.0–45.0)
Hemoglobin: 13.7 g/dL (ref 11.7–15.5)
MCH: 30.7 pg (ref 27.0–33.0)
MCHC: 33.4 g/dL (ref 32.0–36.0)
MCV: 91.9 fL (ref 80.0–100.0)
MPV: 9.8 fL (ref 7.5–12.5)
Monocytes Relative: 7 %
Neutro Abs: 4125 {cells}/uL (ref 1500–7800)
Neutrophils Relative %: 49.7 %
Platelets: 365 10*3/uL (ref 140–400)
RBC: 4.46 10*6/uL (ref 3.80–5.10)
RDW: 12.5 % (ref 11.0–15.0)
Total Lymphocyte: 34 %
WBC: 8.3 10*3/uL (ref 3.8–10.8)

## 2023-01-23 LAB — BASIC METABOLIC PANEL WITH GFR
BUN: 12 mg/dL (ref 7–25)
CO2: 28 mmol/L (ref 20–32)
Calcium: 9.7 mg/dL (ref 8.6–10.2)
Chloride: 106 mmol/L (ref 98–110)
Creat: 0.85 mg/dL (ref 0.50–0.99)
Glucose, Bld: 87 mg/dL (ref 65–99)
Potassium: 4.3 mmol/L (ref 3.5–5.3)
Sodium: 142 mmol/L (ref 135–146)
eGFR: 87 mL/min/{1.73_m2} (ref >=60)

## 2023-01-23 LAB — C-REACTIVE PROTEIN: CRP: 18.4 mg/L — ABNORMAL HIGH (ref <8.0)

## 2023-01-23 NOTE — Telephone Encounter (Unsigned)
 Copied from CRM [id number]. Topic: General - Other >> Jan 23, 2023  8:37 AM Turkey B wrote: Reason for CRM: janice from short term disability called to speak with nurse, has questions about pt. To process their disability. Please cb

## 2023-01-24 ENCOUNTER — Encounter: Payer: Self-pay | Admitting: Family Medicine

## 2023-01-24 ENCOUNTER — Ambulatory Visit (INDEPENDENT_AMBULATORY_CARE_PROVIDER_SITE_OTHER): Payer: 59 | Admitting: Family Medicine

## 2023-01-24 ENCOUNTER — Encounter: Payer: Self-pay | Admitting: Psychiatry

## 2023-01-24 ENCOUNTER — Telehealth: Payer: 59 | Admitting: Psychiatry

## 2023-01-24 VITALS — BP 128/80 | HR 83 | Temp 97.7°F | Resp 18 | Ht 65.0 in | Wt 311.3 lb

## 2023-01-24 DIAGNOSIS — J189 Pneumonia, unspecified organism: Secondary | ICD-10-CM | POA: Diagnosis not present

## 2023-01-24 DIAGNOSIS — Z8659 Personal history of other mental and behavioral disorders: Secondary | ICD-10-CM

## 2023-01-24 DIAGNOSIS — R052 Subacute cough: Secondary | ICD-10-CM | POA: Diagnosis not present

## 2023-01-24 DIAGNOSIS — R7982 Elevated C-reactive protein (CRP): Secondary | ICD-10-CM

## 2023-01-24 DIAGNOSIS — F4310 Post-traumatic stress disorder, unspecified: Secondary | ICD-10-CM | POA: Diagnosis not present

## 2023-01-24 DIAGNOSIS — L299 Pruritus, unspecified: Secondary | ICD-10-CM | POA: Diagnosis not present

## 2023-01-24 DIAGNOSIS — F424 Excoriation (skin-picking) disorder: Secondary | ICD-10-CM | POA: Diagnosis not present

## 2023-01-24 DIAGNOSIS — F1011 Alcohol abuse, in remission: Secondary | ICD-10-CM

## 2023-01-24 DIAGNOSIS — F411 Generalized anxiety disorder: Secondary | ICD-10-CM | POA: Diagnosis not present

## 2023-01-24 MED ORDER — BENZONATATE 100 MG PO CAPS: 100.0000 mg | ORAL_CAPSULE | Freq: Three times a day (TID) | ORAL | 0 refills | Status: DC | PRN

## 2023-01-24 MED ORDER — HYDROXYZINE HCL 10 MG PO TABS: 10.0000 mg | ORAL_TABLET | Freq: Three times a day (TID) | ORAL | 0 refills | Status: DC | PRN

## 2023-01-24 NOTE — Progress Notes (Signed)
Virtual Visit via Video Note  I connected with Barbara Pennington on 01/24/23 at  3:30 PM EST by a video enabled telemedicine application and verified that I am speaking with the correct person using two identifiers.  Location Provider Location : [facility] Patient Location : Home  Participants: Patient , Provider   I discussed the limitations of evaluation and management by telemedicine and the availability of in person appointments. The patient expressed understanding and agreed to proceed.   I discussed the assessment and treatment plan with the patient. The patient was provided an opportunity to ask questions and all were answered. The patient agreed with the plan and demonstrated an understanding of the instructions.   The patient was advised to call back or seek an in-person evaluation if the symptoms worsen or if the condition fails to improve as anticipated.   [facility] MD OP Progress Note  01/25/2023 7:53 AM Barbara Pennington  MRN:  [id number]  Chief Complaint:  Chief Complaint  Patient presents with   Follow-up   Depression   Anxiety   Medication Refill   HPI: Barbara Pennington is a 43 year old Caucasian female, divorced, currently lives in [place], has a history of PTSD, anxiety, skin picking disorder, borderline personality disorder, morbid obesity, OSA on CPAP, hypertension/chronic kidney disease, asthma, chronic back pain, right sided knee injury was evaluated by telemedicine today.   The patient, diagnosed with pneumonia and a severe asthma attack, has been out of work since the beginning of January. She describes the experience as feeling "on death's door," but reports feeling much better, though not yet at 100%. The patient has no known cause for the pneumonia, having last had it at 46 months old. The patient has been experiencing a persistent cough, which is particularly problematic when talking.  The patient has also been dealing with emotional distress, reporting more crying than  usual. She admits to being lax about taking her antidepressant medication due to feeling overwhelmed by the number of medications she was on.  In addition to these issues, the patient has been dealing with cardiac concerns. A recent Holter monitor test revealed a heart rate as high as 207 and as low as 47. The patient has an upcoming appointment with a cardiologist to discuss these findings.  The patient has been engaging in therapy, with appointments scheduled in advance. However, due to the severity of her asthma, she was unable to attend the most recent session. The patient plans to continue with therapy once her health improves.  Patient currently denies any suicidality, homicidality or perceptual disturbances.  Agrees to start being compliant on her medications as well as follow-up with therapy sessions.  Denies any other concerns today.  Visit Diagnosis:    ICD-10-CM   1. PTSD (post-traumatic stress disorder)  F43.10     2. GAD (generalized anxiety disorder)  F41.1     3. 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 History of borderline personality disorder  Z86.59     5. History of alcohol abuse  F10.11       Past Psychiatric History: I have reviewed past psychiatric history from progress note on 05/05/2021.  Patient was previously under the care of [facility].  Past Medical History:  Past Medical History:  Diagnosis Date   Anxiety    Arthritis 2006   rhuematoid - mild - no current issues   Asthma 2003   Borderline personality disorder (HCC)    Chronic kidney disease    stage 3  Complication of anesthesia    "Usually" has low temp after surgery.After hysterectomy temp was 94   Depression    GERD (gastroesophageal reflux disease)    History of kidney stones    Hyperlipidemia    Hypertension 2013   Lump or mass in breast 2013   RIGHT BREAST   Motion sickness    repeated amusement park rides   PTSD (post-traumatic stress disorder)    Scoliosis    no current issues   Shortness  of breath dyspnea    secondary to weight    Sleep apnea    uses cpap   Ulcer 2001    Past Surgical History:  Procedure Laterality Date   ABDOMINAL HYSTERECTOMY  01/08/2006   BREAST BIOPSY Left 01/09/2011   CORE - FIBROADENOMA VS PHYLLODES   CESAREAN SECTION  01/08/2005   COLONOSCOPY WITH PROPOFOL N/A 10/09/2016   Procedure: COLONOSCOPY WITH PROPOFOL;  Surgeon: Wyline Mood, MD;  [facility] END[facility];  Service: Gastroenterology;  Laterality: N/A;   COLONOSCOPY WITH PROPOFOL N/A 11/09/2019   Procedure: COLONOSCOPY WITH PROPOFOL;  Surgeon: Toney Reil, MD;  Location: [facility] ENDOSCOPY;  Service: Gastroenterology;  Laterality: N/A;   ESOPHAGOGASTRODUODENOSCOPY     GUM SURGERY  01/08/2002   KNEE ARTHROSCOPY WITH MEDIAL MENISECTOMY Right 07/24/2021   Procedure: Right medial meniscus root repair and patella chondroplasty;  Surgeon: Signa Kell, MD;  Location: [facility] ORS;  Service: Orthopedics;  Laterality: Right;   PLANTAR FASCIA RELEASE Left 08/19/2014   Procedure: ENDOSCOPIC PLANTAR FASCIOTOMY RELEASE L WITH TOPAZ;  Surgeon: Recardo Evangelist, DPM;  Location: [facility];  Service: Podiatry;  Laterality: Left;  LMA WITH POPLITEAL BLOCK   TONSILLECTOMY  01/08/1982    Family Psychiatric History: I have reviewed family psychiatric history from progress note on 05/05/2021.  Family History:  Family History  Problem Relation Age of Onset   Obesity Mother    Diabetes Mother    High Cholesterol Mother    Hypertension Mother    Neuropathy Mother    Depression Mother    Anxiety disorder Mother    Obesity Father    Hypertension Father    High Cholesterol Father    Depression Father    Anxiety disorder Father    Seizures Brother    Testicular cancer Brother    Breast cancer Maternal Grandmother    Hypertension Maternal Grandmother    Aneurysm Maternal Grandmother    Stroke Maternal Grandmother    Hypertension Maternal Grandfather    High Cholesterol Maternal Grandfather     Heart disease Maternal Grandfather        has a pacemaker   Stroke Maternal Grandfather    Alzheimer's disease Paternal Grandmother    Dementia Paternal Grandmother    Colon cancer Paternal Grandfather    Mental illness Maternal Aunt    Suicidality Cousin     Social History: I have reviewed social history from progress note on 05/05/2021. Social History   Socioeconomic History   Marital status: Divorced    Spouse name: Not on file   Number of children: 2   Years of education: 14   Highest education level: Bachelor's degree (e.g., BA, AB, BS)  Occupational History   Not on file  Tobacco Use   Smoking status: Former    Current packs/day: 0.00    Average packs/day: 0.3 packs/day for 10.0 years (2.5 ttl pk-yrs)    Types: Cigarettes    Start date: 07/04/2004    Quit date: 07/05/2014    Years since  quitting: 8.5   Smokeless tobacco: Never  Vaping Use   Vaping status: Never Used  Substance and Sexual Activity   Alcohol use: Yes    Comment: rare   Drug use: No   Sexual activity: Yes    Partners: Male    Birth control/protection: Surgical    Comment: 2007  Other Topics Concern   Not on file  Social History Narrative   Not on file   Social Drivers of Health   Financial Resource Strain: High Risk (10/29/2022)   Overall Financial Resource Strain (CARDIA)    Difficulty of Paying Living Expenses: Hard  Food Insecurity: Food Insecurity Present (10/29/2022)   Hunger Vital Sign    Worried About Running Out of Food in the Last Year: Sometimes true    Ran Out of Food in the Last Year: Never true  Transportation Needs: No Transportation Needs (10/29/2022)   PRAPARE - Administrator, Civil Service (Medical): No    Lack of Transportation (Non-Medical): No  Physical Activity: Sufficiently Active (10/29/2022)   Exercise Vital Sign    Days of Exercise per Week: 3 days    Minutes of Exercise per Session: 90 min  Stress: Stress Concern Present (10/29/2022)   [employer] of Occupational Health - Occupational Stress Questionnaire    Feeling of Stress : To some extent  Social Connections: Moderately Isolated (10/29/2022)   Social Connection and Isolation Panel [NHANES]    Frequency of Communication with Friends and Family: More than three times a week    Frequency of Social Gatherings with Friends and Family: Once a week    Attends Religious Services: Never    Database administrator or Organizations: No    Attends Engineer, structural: Not on file    Marital Status: Living with partner    Allergies:  Allergies  Allergen Reactions   Latex Rash   Metformin And Related     diarrhea    Metabolic Disorder Labs: Lab Results  Component Value Date   HGBA1C 5.2 11/08/2022   MPG 103 11/08/2022   MPG 111 12/15/2021   No results found for: "PROLACTIN" Lab Results  Component Value Date   CHOL 147 11/08/2022   TRIG 100 11/08/2022   HDL 47 (L) 11/08/2022   CHOLHDL 3.1 11/08/2022   VLDL 27 08/06/2019   LDLCALC 80 11/08/2022   LDLCALC 110 (H) 12/15/2021   Lab Results  Component Value Date   TSH 1.83 12/15/2021   TSH 2.59 04/04/2018    Therapeutic Level Labs: No results found for: "LITHIUM" No results found for: "VALPROATE" No results found for: "CBMZ"  Current Medications: Current Outpatient Medications  Medication Sig Dispense Refill   acetaminophen (TYLENOL) 500 MG tablet Take 1,000 mg by mouth every 6 (six) hours as needed for moderate pain (pain score 4-6).     Albuterol-Budesonide (AIRSUPRA) 90-80 MCG/ACT AERO Inhale 2 puffs into the lungs 4 (four) times daily. 10.7 g 0   amLODipine (NORVASC) 2.5 MG tablet Take 1 tablet (2.5 mg total) by mouth every morning. 90 tablet 1   atorvastatin (LIPITOR) 20 MG tablet Take 1 tablet (20 mg total) by mouth every morning. 90 tablet 1   benzonatate (TESSALON) 100 MG capsule Take 1-2 capsules (100-200 mg total) by mouth 3 (three) times daily as needed. 60 capsule 0   buPROPion  (WELLBUTRIN XL) 150 MG 24 hr tablet Take 1 tablet (150 mg total) by mouth daily with breakfast. Stop wellbutrin xl 300 mg 90  tablet 0   busPIRone (BUSPAR) 30 MG tablet Take 1 tablet (30 mg total) by mouth 2 (two) times daily. 180 tablet 1   clonazePAM (KLONOPIN) 0.5 MG tablet Take 1 tablet (0.5 mg total) by mouth daily as needed for anxiety. Please limit use 15 tablet 0   desloratadine (CLARINEX) 5 MG tablet Take 1 tablet (5 mg total) by mouth daily. 90 tablet 1   escitalopram (LEXAPRO) 20 MG tablet Take 1 tablet (20 mg total) by mouth daily. 90 tablet 1   fluticasone (FLONASE) 50 MCG/ACT nasal spray Place 1 spray into both nostrils daily. 48 mL 1   gabapentin (NEURONTIN) 300 MG capsule Take 1 capsule by mouth 3 (three) times daily.     hydrOXYzine (ATARAX) 10 MG tablet Take 1 tablet (10 mg total) by mouth 3 (three) times daily as needed. 30 tablet 0   Melatonin 10 MG CAPS Take 10-20 mg by mouth at bedtime.     meloxicam (MOBIC) 15 MG tablet Take 15 mg by mouth daily.     methylPREDNISolone (MEDROL DOSEPAK) 4 MG TBPK tablet Take as directed 21 tablet 0   montelukast (SINGULAIR) 10 MG tablet Take 1 tablet (10 mg total) by mouth at bedtime. 90 tablet 1   Semaglutide-Weight Management (WEGOVY) 2.4 MG/0.75ML SOAJ Inject 2.4 mg into the skin once a week. 18 mL 0   Solriamfetol HCl (SUNOSI) 75 MG TABS Take 1 tablet (75 mg total) by mouth every morning. 90 tablet 0   valsartan-hydrochlorothiazide (DIOVAN-HCT) 320-12.5 MG tablet Take 1 tablet by mouth daily. 90 tablet 0   No current facility-administered medications for this visit.     Musculoskeletal: Strength & Muscle Tone:  UTA Gait & Station:  Seated Patient leans: N/A  Psychiatric Specialty Exam: Review of Systems  Psychiatric/Behavioral:  The patient is nervous/anxious.        Crying spells    There were no vitals taken for this visit.There is no height or weight on file to calculate BMI.  General Appearance: Casual  Eye Contact:  Fair   Speech:  Clear and Coherent  Volume:  Normal  Mood:  Anxious  Affect:  Congruent  Thought Process:  Goal Directed and Descriptions of Associations: Intact  Orientation:  Full (Time, Place, and Person)  Thought Content: Logical   Suicidal Thoughts:  No  Homicidal Thoughts:  No  Memory:  Immediate;   Fair Recent;   Fair Remote;   Fair  Judgement:  Fair  Insight:  Fair  Psychomotor Activity:  Normal  Concentration:  Concentration: Fair and Attention Span: Fair  Recall:  [employer] of Knowledge: Fair  Language: Fair  Akathisia:  No  Handed:  Right  AIMS (if indicated): not done  Assets:  Communication Skills Desire for Improvement Housing Social Support  ADL's:  Intact  Cognition: WNL  Sleep:   restless due to cough from pneumonia   Screenings: AIMS    Flowsheet Row Video Visit from 06/13/2021 in [facility] Office Visit from 05/05/2021 in [facility]  AIMS Total Score 0 0      GAD-7    Flowsheet Row Office Visit from 01/24/2023 in [facility] Office Visit from 01/15/2023 in [facility] Counsel[facility] from 01/10/2023 in [facility] Office Visit from 12/17/2022 in [facility] Office Visit from 09/19/2022 in [facility]  Total GAD-7 Score 0 0  14 0 1      PHQ2-9    Flowsheet Row Office Visit from 01/24/2023 in [facility] Office Visit from 01/15/2023 in [facility] Counselor from 01/10/2023 in [facility] Office Visit from 12/17/2022 in [facility] Office Visit from 11/08/2022 in [facility]  PHQ-2 Total Score 0 0 1 0 0  PHQ-9 Total Score 0 0 7 0 0      Flowsheet Row Video Visit from 01/24/2023 in [facility] ED from 01/10/2023 in [facility] at [facility] Video Visit from 12/17/2022 in [facility]  C-SSRS RISK CATEGORY No Risk No Risk No Risk        Assessment and Plan: Barbara Pennington is a 43 year old Caucasian female who has a history of PTSD, borderline personality disorder, depression was evaluated by telemedicine today.  Patient currently struggling with pneumonia as well as breathing difficulties and cough due to the same as well as has been noncompliant with her psychotropic medications lately, discussed assessment and plan as noted below.  Generalized anxiety disorder-unstable Posttraumatic stress disorder-unstable Inconsistent with Lexapro adherence, leading to increased emotional lability and crying. Plan to switch to Prozac deferred until pneumonia resolves and cardiology follow-up is completed. Emphasized importance of consistent medication adherence. - Encourage consistent medication adherence - Continue Lexapro 20 mg p.o. daily - Continue Wellbutrin 150 mg p.o. daily - Continue BuSpar 30 mg twice daily - Continue clonazepam 0.5 mg as needed for severe anxiety attacks only-advised to limit use due to recent shortness of breath due to pneumonia. - Reassess need for medication change post-pneumonia recovery and cardiology follow-up - Encouraged to continue psychotherapy sessions with therapist.  Skin picking disorder-improving Currently denies any concerns. -Continue CBT as well as medications as prescribed  Borderline personality disorder-per history Chronic condition with emotional instability. -Patient to continue CBT.  Alcohol use disorder in remission Sober since 2017.  Will continue to monitor.    Follow-up - Follow up with cardiology in February - Request cardiology report for further management - Schedule follow-up appointment for March 18th at 11 AM via  video.   Collaboration of Care: Collaboration of Care: Referral or follow-up with counselor/therapist AEB patient encouraged to continue to follow up with therapist as well as follow up with primary care provider and cardiology as scheduled.  Patient to sign an ROI to obtain cardiology records.  Patient/Guardian was advised Release of Information must be obtained prior to any record release in order to collaborate their care with an outside provider. Patient/Guardian was advised if they have not already done so to contact the registration department to sign all necessary forms in order for [name] to release information regarding their care.   Consent: Patient/Guardian gives verbal consent for treatment and assignment of benefits for services provided during this visit. Patient/Guardian expressed understanding and agreed to proceed.   This note was generated in part or whole with voice recognition software. Voice recognition is usually quite accurate but there are transcription errors that can and very often do occur. I apologize for any typographical errors that were not detected and corrected.    Jomarie Longs, MD 01/25/2023, 7:53 AM

## 2023-01-24 NOTE — Progress Notes (Deleted)
Name: Barbara Pennington   MRN: [id number]    DOB: 03/28/1980   Date:01/24/2023       Progress Note  Subjective  Chief Complaint  Chief Complaint  Patient presents with   Follow-up    Feeling much better   {HPI:32069} *** Patient Active Problem List   Diagnosis Date Noted   High risk medication use 12/17/2022   Chronic cough 02/13/2022   Skin-picking disorder 12/19/2021   Adjustment disorder with depressed mood 06/13/2021   Closed nondisplaced longitudinal fracture of right patella 06/09/2021   GAD (generalized anxiety disorder) 05/05/2021   Depressive disorder 05/05/2021   History of borderline personality disorder 05/05/2021   History of alcohol abuse 05/05/2021   Internal derangement of left knee 04/03/2021   Primary osteoarthritis of left knee 04/03/2021   Metabolic syndrome 03/24/2021   Impaired fasting glucose 12/06/2020   Lymphedema 12/06/2020   Moderate episode of recurrent major depressive disorder (HCC) 02/08/2020   CKD (chronic kidney disease) stage 3, GFR 30-59 ml/min (HCC) 06/11/2019   PTSD (post-traumatic stress disorder) 01/14/2019   Borderline personality disorder (HCC) 01/14/2019   Anxiety 01/14/2019   Mixed hyperlipidemia 01/14/2019   Morbid obesity with BMI of 50.0-59.9, adult (HCC) 01/14/2019   OSA on CPAP 01/14/2019   Asthma, mild persistent 04/30/2018   Low back pain 08/20/2017   Fibroadenoma of breast, 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   Essential hypertension     Past Surgical History:  Procedure Laterality Date   ABDOMINAL HYSTERECTOMY  01/08/2006   BREAST BIOPSY Left 01/09/2011   CORE - FIBROADENOMA VS PHYLLODES   CESAREAN SECTION  01/08/2005   COLONOSCOPY WITH PROPOFOL N/A 10/09/2016   Procedure: COLONOSCOPY WITH PROPOFOL;  Surgeon: Wyline Mood, MD;  Location: [facility] ENDOSCOPY;  Service: Gastroenterology;  Laterality: N/A;   COLONOSCOPY WITH PROPOFOL N/A 11/09/2019   Procedure: COLONOSCOPY WITH PROPOFOL;  Surgeon: Toney Reil, MD;  Location: [facility]  ENDOSCOPY;  Service: Gastroenterology;  Laterality: N/A;   ESOPHAGOGASTRODUODENOSCOPY     GUM SURGERY  01/08/2002   KNEE ARTHROSCOPY WITH MEDIAL MENISECTOMY Right 07/24/2021   Procedure: Right medial meniscus root repair and patella chondroplasty;  Surgeon: Signa Kell, MD;  Location: [facility] ORS;  Service: Orthopedics;  Laterality: Right;   PLANTAR FASCIA RELEASE Left 08/19/2014   Procedure: ENDOSCOPIC PLANTAR FASCIOTOMY RELEASE L WITH TOPAZ;  Surgeon: Recardo Evangelist, DPM;  Location: [facility];  Service: Podiatry;  Laterality: Left;  LMA WITH POPLITEAL BLOCK   TONSILLECTOMY  01/08/1982    Family History  Problem Relation Age of Onset   Obesity Mother    Diabetes Mother    High Cholesterol Mother    Hypertension Mother    Neuropathy Mother    Depression Mother    Anxiety disorder Mother    Obesity Father    Hypertension Father    High Cholesterol Father    Depression Father    Anxiety disorder Father    Seizures Brother    Testicular cancer Brother    Breast cancer Maternal Grandmother    Hypertension Maternal Grandmother    Aneurysm Maternal Grandmother    Stroke Maternal Grandmother    Hypertension Maternal Grandfather    High Cholesterol Maternal Grandfather    Heart disease Maternal Grandfather        has a pacemaker   Stroke Maternal Grandfather    Alzheimer's disease Paternal Grandmother    Dementia Paternal Grandmother    Colon cancer Paternal Grandfather    Mental illness Maternal Aunt    Suicidality Cousin  Social History   Tobacco Use   Smoking status: Former    Current packs/day: 0.00    Average packs/day: 0.3 packs/day for 10.0 years (2.5 ttl pk-yrs)    Types: Cigarettes    Start date: 07/04/2004    Quit date: 07/05/2014    Years since quitting: 8.5   Smokeless tobacco: Never  Substance Use Topics   Alcohol use: Yes    Comment: rare     Current Outpatient Medications:    acetaminophen (TYLENOL) 500 MG tablet, Take 1,000 mg by mouth  every 6 (six) hours as needed for moderate pain (pain score 4-6)., Disp: , Rfl:    Albuterol-Budesonide (AIRSUPRA) 90-80 MCG/ACT AERO, Inhale 2 puffs into the lungs 4 (four) times daily., Disp: 10.7 g, Rfl: 0   amLODipine (NORVASC) 2.5 MG tablet, Take 1 tablet (2.5 mg total) by mouth every morning., Disp: 90 tablet, Rfl: 1   atorvastatin (LIPITOR) 20 MG tablet, Take 1 tablet (20 mg total) by mouth every morning., Disp: 90 tablet, Rfl: 1   buPROPion (WELLBUTRIN XL) 150 MG 24 hr tablet, Take 1 tablet (150 mg total) by mouth daily with breakfast. Stop wellbutrin xl 300 mg, Disp: 90 tablet, Rfl: 0   busPIRone (BUSPAR) 30 MG tablet, Take 1 tablet (30 mg total) by mouth 2 (two) times daily., Disp: 180 tablet, Rfl: 1   desloratadine (CLARINEX) 5 MG tablet, Take 1 tablet (5 mg total) by mouth daily., Disp: 90 tablet, Rfl: 1   escitalopram (LEXAPRO) 20 MG tablet, Take 1 tablet (20 mg total) by mouth daily., Disp: 90 tablet, Rfl: 1   fluticasone (FLONASE) 50 MCG/ACT nasal spray, Place 1 spray into both nostrils daily., Disp: 48 mL, Rfl: 1   Melatonin 10 MG CAPS, Take 10-20 mg by mouth at bedtime., Disp: , Rfl:    meloxicam (MOBIC) 15 MG tablet, Take 15 mg by mouth daily., Disp: , Rfl:    methylPREDNISolone (MEDROL DOSEPAK) 4 MG TBPK tablet, Take as directed, Disp: 21 tablet, Rfl: 0   montelukast (SINGULAIR) 10 MG tablet, Take 1 tablet (10 mg total) by mouth at bedtime., Disp: 90 tablet, Rfl: 1   Semaglutide-Weight Management (WEGOVY) 2.4 MG/0.75ML SOAJ, Inject 2.4 mg into the skin once a week., Disp: 18 mL, Rfl: 0   Solriamfetol HCl (SUNOSI) 75 MG TABS, Take 1 tablet (75 mg total) by mouth every morning., Disp: 90 tablet, Rfl: 0   valsartan-hydrochlorothiazide (DIOVAN-HCT) 320-12.5 MG tablet, Take 1 tablet by mouth daily., Disp: 90 tablet, Rfl: 0   clonazePAM (KLONOPIN) 0.5 MG tablet, Take 1 tablet (0.5 mg total) by mouth daily as needed for anxiety. Please limit use, Disp: 15 tablet, Rfl: 0   gabapentin  (NEURONTIN) 300 MG capsule, Take 1 capsule by mouth 3 (three) times daily., Disp: , Rfl:   Allergies  Allergen Reactions   Latex Rash   Metformin And Related     diarrhea    I personally reviewed {Reviewed:14835} with the patient/caregiver today.   ROS  ***  Objective  Vitals:   01/24/23 0834  BP: 128/80  Pulse: 83  Resp: 18  Temp: 97.7 F (36.5 C)  TempSrc: Oral  SpO2: 95%  Weight: (!) 311 lb 4.8 oz (141.2 kg)  Height: 5\' 5"  (1.651 m)    Body mass index is 51.8 kg/m.  Physical Exam ***  Recent Results (from the past 2160 hours)  Urine cytology ancillary only     Status: None   Collection Time: 11/08/22  3:10 PM  Result Value  Ref Range   Neisseria Gonorrhea Negative    Chlamydia Negative    Comment Normal Reference Ranger Chlamydia - Negative    Comment      Normal Reference Range Neisseria Gonorrhea - Negative  CBC with Differential/Platelet     Status: Abnormal   Collection Time: 11/08/22  3:32 PM  Result Value Ref Range   WBC 12.4 (H) 3.8 - 10.8 Thousand/uL   RBC 4.39 3.80 - 5.10 Million/uL   Hemoglobin 13.6 11.7 - 15.5 g/dL   HCT 16.1 09.6 - 04.5 %   MCV 95.4 80.0 - 100.0 fL   MCH 31.0 27.0 - 33.0 pg   MCHC 32.5 32.0 - 36.0 g/dL    Comment: For adults, a slight decrease in the calculated MCHC value (in the range of 30 to 32 g/dL) is most likely not clinically significant; however, it should be interpreted with caution in correlation with other red cell parameters and the patient's clinical condition.    RDW 12.5 11.0 - 15.0 %   Platelets 308 140 - 400 Thousand/uL   MPV 9.9 7.5 - 12.5 fL   Neutro Abs 8,134 (H) 1,500 - 7,800 cells/uL   Absolute Lymphocytes 3,125 850 - 3,900 cells/uL   Absolute Monocytes 794 200 - 950 cells/uL   Eosinophils Absolute 260 15 - 500 cells/uL   Basophils Absolute 87 0 - 200 cells/uL   Neutrophils Relative % 65.6 %   Total Lymphocyte 25.2 %   Monocytes Relative 6.4 %   Eosinophils Relative 2.1 %   Basophils  Relative 0.7 %  COMPLETE METABOLIC PANEL WITH GFR     Status: Abnormal   Collection Time: 11/08/22  3:32 PM  Result Value Ref Range   Glucose, Bld 83 65 - 99 mg/dL    Comment: .            Fasting reference interval .    BUN 12 7 - 25 mg/dL   Creat 4.09 (H) 8.11 - 0.99 mg/dL   eGFR 63 > OR = 60 BJ/YNW/2.95A2   BUN/Creatinine Ratio 11 6 - 22 (calc)   Sodium 142 135 - 146 mmol/L   Potassium 4.4 3.5 - 5.3 mmol/L   Chloride 102 98 - 110 mmol/L   CO2 32 20 - 32 mmol/L   Calcium 9.3 8.6 - 10.2 mg/dL   Total Protein 5.8 (L) 6.1 - 8.1 g/dL   Albumin 3.9 3.6 - 5.1 g/dL   Globulin 1.9 1.9 - 3.7 g/dL (calc)   AG Ratio 2.1 1.0 - 2.5 (calc)   Total Bilirubin 0.3 0.2 - 1.2 mg/dL   Alkaline phosphatase (APISO) 45 31 - 125 U/L   AST 8 (L) 10 - 30 U/L   ALT 17 6 - 29 U/L  Lipid panel     Status: Abnormal   Collection Time: 11/08/22  3:32 PM  Result Value Ref Range   Cholesterol 147 <200 mg/dL   HDL 47 (L) > OR = 50 mg/dL   Triglycerides 130 <865 mg/dL   LDL Cholesterol (Calc) 80 mg/dL (calc)    Comment: Reference range: <100 . Desirable range <100 mg/dL for primary prevention;   <70 mg/dL for patients with CHD or diabetic patients  with > or = 2 CHD risk factors. Marland Kitchen LDL-C is now calculated using the Martin-Hopkins  calculation, which is a validated novel method providing  better accuracy than the Friedewald equation in the  estimation of LDL-C.  Horald Pollen et al. Lenox Ahr. 7846;962(95): 2061-2068  (http://education.[facility].com/faq/FAQ164)  Total CHOL/HDL Ratio 3.1 <5.0 (calc)   Non-HDL Cholesterol (Calc) 100 <130 mg/dL (calc)    Comment: For patients with diabetes plus 1 major ASCVD risk  factor, treating to a non-HDL-C goal of <100 mg/dL  (LDL-C of <56 mg/dL) is considered a therapeutic  option.   Hemoglobin A1c     Status: None   Collection Time: 11/08/22  3:32 PM  Result Value Ref Range   Hgb A1c MFr Bld 5.2 <5.7 % of total Hgb    Comment: For the purpose of screening  for the presence of diabetes: . <5.7%       Consistent with the absence of diabetes 5.7-6.4%    Consistent with increased risk for diabetes             (prediabetes) > or =6.5%  Consistent with diabetes . This assay result is consistent with a decreased risk of diabetes. . Currently, no consensus exists regarding use of hemoglobin A1c for diagnosis of diabetes in children. . According to American Diabetes Association (ADA) guidelines, hemoglobin A1c <7.0% represents optimal control in non-pregnant diabetic patients. Different metrics may apply to specific patient populations.  Standards of Medical Care in Diabetes(ADA). .    Mean Plasma Glucose 103 mg/dL   eAG (mmol/L) 5.7 mmol/L  VITAMIN D 25 Hydroxy (Vit-D Deficiency, Fractures)     Status: None   Collection Time: 11/08/22  3:32 PM  Result Value Ref Range   Vit D, 25-Hydroxy 37 30 - 100 ng/mL    Comment: Vitamin D Status         25-OH Vitamin D: . Deficiency:                    <20 ng/mL Insufficiency:             20 - 29 ng/mL Optimal:                 > or = 30 ng/mL . For 25-OH Vitamin D testing on patients on  D2-supplementation and patients for whom quantitation  of D2 and D3 fractions is required, the QuestAssureD(TM) 25-OH VIT D, (D2,D3), LC/MS/MS is recommended: order  code 38756 (patients >20yrs). . See Note 1 . Note 1 . For additional information, please refer to  http://education.[facility].com/faq/FAQ199  (This link is being provided for informational/ educational purposes only.)   Parathyroid hormone, intact (no Ca)     Status: None   Collection Time: 11/08/22  3:32 PM  Result Value Ref Range   PTH 67 16 - 77 pg/mL    Comment: . Interpretive Guide    Intact PTH           Calcium ------------------    ----------           ------- Normal Parathyroid    Normal               Normal Hypoparathyroidism    Low or Low Normal    Low Hyperparathyroidism    Primary            Normal or High        High    Secondary          High                 Normal or Low    Tertiary           High                 High  Non-Parathyroid    Hypercalcemia      Low or Low Normal    High .   B12 and Folate Panel     Status: Abnormal   Collection Time: 11/08/22  3:32 PM  Result Value Ref Range   Vitamin B-12 309 200 - 1,100 pg/mL    Comment: . Please Note: Although the reference range for vitamin B12 is [phone] pg/mL, it has been reported that between 5 and 10% of patients with values between 200 and 400 pg/mL may experience neuropsychiatric and hematologic abnormalities due to occult B12 deficiency; less than 1% of patients with values above 400 pg/mL will have symptoms. .    Folate 4.3 (L) ng/mL    Comment:                            Reference Range                            Low:           <3.4                            Borderline:    3.4-5.4                            Normal:        >5.4 .   HIV Antibody (routine testing w rflx)     Status: None   Collection Time: 11/08/22  3:32 PM  Result Value Ref Range   HIV 1&2 Ab, 4th Generation NON-REACTIVE NON-REACTIVE    Comment: HIV-1 antigen and HIV-1/HIV-2 antibodies were not detected. There is no laboratory evidence of HIV infection. Marland Kitchen PLEASE NOTE: This information has been disclosed to you from records whose confidentiality may be protected by state law.  If your state requires such protection, then the state law prohibits you from making any further disclosure of the information without the specific written consent of the person to whom it pertains, or as otherwise permitted by law. A general authorization for the release of medical or other information is NOT sufficient for this purpose. . For additional information please refer to http://education.[facility].com/faq/FAQ106 (This link is being provided for informational/ educational purposes only.) . Marland Kitchen The performance of this assay has not been clinically validated in  patients less than 82 years old. .   RPR     Status: None   Collection Time: 11/08/22  3:32 PM  Result Value Ref Range   RPR Ser Ql NON-REACTIVE NON-REACTIVE    Comment: . No laboratory evidence of syphilis. If recent exposure is suspected, submit a new sample in 2-4 weeks. .   CBG monitoring, ED     Status: Abnormal   Collection Time: 12/12/22  8:28 AM  Result Value Ref Range   Glucose-Capillary 132 (H) 70 - 99 mg/dL    Comment: Glucose reference range applies only to samples taken after fasting for at least 8 hours.  Basic metabolic panel     Status: Abnormal   Collection Time: 12/12/22  8:31 AM  Result Value Ref Range   Sodium 139 135 - 145 mmol/L   Potassium 3.4 (L) 3.5 - 5.1 mmol/L   Chloride 105 98 - 111 mmol/L   CO2 25 22 - 32 mmol/L   Glucose, Bld 131 (H) 70 -  99 mg/dL    Comment: Glucose reference range applies only to samples taken after fasting for at least 8 hours.   BUN 12 6 - 20 mg/dL   Creatinine, Ser 4.09 (H) 0.44 - 1.00 mg/dL   Calcium 9.0 8.9 - 81.1 mg/dL   GFR, Estimated >91 >47 mL/min    Comment: (NOTE) Calculated using the CKD-EPI Creatinine Equation (2021)    Anion gap 9 5 - 15    Comment: Performed at [facility], [address] [place], [place] [address]  CBC     Status: None   Collection Time: 12/12/22  8:31 AM  Result Value Ref Range   WBC 10.0 4.0 - 10.5 K/uL   RBC 4.71 3.87 - 5.11 MIL/uL   Hemoglobin 14.6 12.0 - 15.0 g/dL   HCT 21.3 08.6 - 57.8 %   MCV 94.1 80.0 - 100.0 fL   MCH 31.0 26.0 - 34.0 pg   MCHC 33.0 30.0 - 36.0 g/dL   RDW 46.9 62.9 - 52.8 %   Platelets 277 150 - 400 K/uL   nRBC 0.0 0.0 - 0.2 %    Comment: Performed at [facility], [address] [place], [place] [address]  Troponin I (High Sensitivity)     Status: None   Collection Time: 12/12/22  8:31 AM  Result Value Ref Range   Troponin I (High Sensitivity) 3 <18 ng/L    Comment: (NOTE) Elevated high sensitivity troponin I (hsTnI) values and significant   changes across serial measurements may suggest ACS but many other  chronic and acute conditions are known to elevate hsTnI results.  Refer to the "Links" section for chest pain algorithms and additional  guidance. Performed at [facility], [address] [place], [place] [address]   Urinalysis, Routine w reflex microscopic -Urine, Clean Catch     Status: None   Collection Time: 12/12/22  9:42 AM  Result Value Ref Range   Color, Urine YELLOW YELLOW   APPearance CLEAR CLEAR   Specific Gravity, Urine 1.025 1.005 - 1.030   pH 5.0 5.0 - 8.0   Glucose, UA NEGATIVE NEGATIVE mg/dL   Hgb urine dipstick NEGATIVE NEGATIVE   Bilirubin Urine NEGATIVE NEGATIVE   Ketones, ur NEGATIVE NEGATIVE mg/dL   Protein, ur NEGATIVE NEGATIVE mg/dL   Nitrite NEGATIVE NEGATIVE   Leukocytes,Ua NEGATIVE NEGATIVE    Comment: Performed at [facility], [address], [place], [place] [address]  Urine drugs of abuse scrn w alc, routine (Ref Lab)     Status: None   Collection Time: 12/18/22  5:33 PM  Result Value Ref Range   Amphetamines, Urine Negative Cutoff=1000 ng/mL    Comment: Amphetamine test includes Amphetamine and Methamphetamine.   Barbiturate, Ur Negative Cutoff=300 ng/mL   Benzodiazepine Quant, Ur Negative Cutoff=300 ng/mL   Cannabinoid Quant, Ur Negative Cutoff=50 ng/mL   Cocaine (Metab.) Negative Cutoff=300 ng/mL   Opiate Quant, Ur Negative Cutoff=300 ng/mL    Comment: Opiate test includes Codeine and Morphine only.   Phencyclidine, Ur Negative Cutoff=25 ng/mL   Methadone Screen, Urine Negative Cutoff=300 ng/mL   Propoxyphene, Urine Negative Cutoff=300 ng/mL   Ethanol U, Quan Negative Cutoff=0.020 %    Comment: (NOTE) Performed At: [facility] [address] [place], [place] [id number] Avis Epley PhD [id number]   Basic metabolic panel     Status: Abnormal   Collection Time: 01/10/23 11:43 AM  Result Value Ref Range   Sodium 134 (L)  135 - 145 mmol/L   Potassium 3.5  3.5 - 5.1 mmol/L   Chloride 104 98 - 111 mmol/L   CO2 19 (L) 22 - 32 mmol/L   Glucose, Bld 100 (H) 70 - 99 mg/dL    Comment: Glucose reference range applies only to samples taken after fasting for at least 8 hours.   BUN 7 6 - 20 mg/dL   Creatinine, Ser 0.10 0.44 - 1.00 mg/dL   Calcium 8.2 (L) 8.9 - 10.3 mg/dL   GFR, Estimated >27 >25 mL/min    Comment: (NOTE) Calculated using the CKD-EPI Creatinine Equation (2021)    Anion gap 11 5 - 15    Comment: Performed at [facility], [address] [place], [place] [address]  CBC     Status: Abnormal   Collection Time: 01/10/23 11:43 AM  Result Value Ref Range   WBC 12.5 (H) 4.0 - 10.5 K/uL   RBC 4.25 3.87 - 5.11 MIL/uL   Hemoglobin 13.2 12.0 - 15.0 g/dL   HCT 03.4 74.2 - 59.5 %   MCV 96.2 80.0 - 100.0 fL   MCH 31.1 26.0 - 34.0 pg   MCHC 32.3 30.0 - 36.0 g/dL   RDW 63.8 75.6 - 43.3 %   Platelets 214 150 - 400 K/uL   nRBC 0.0 0.0 - 0.2 %    Comment: Performed at [facility], [address], [place], [place] [address]  Troponin I (High Sensitivity)     Status: None   Collection Time: 01/10/23 11:43 AM  Result Value Ref Range   Troponin I (High Sensitivity) 3 <18 ng/L    Comment: (NOTE) Elevated high sensitivity troponin I (hsTnI) values and significant  changes across serial measurements may suggest ACS but many other  chronic and acute conditions are known to elevate hsTnI results.  Refer to the "Links" section for chest pain algorithms and additional  guidance. Performed at [facility], [address] [place], [place] [address]   COMPLETE METABOLIC PANEL WITH GFR     Status: Abnormal   Collection Time: 01/15/23 11:13 AM  Result Value Ref Range   Glucose, Bld 83 65 - 99 mg/dL    Comment: .            Fasting reference interval .    BUN 10 7 - 25 mg/dL   Creat 0.63 0.16 - 0.10 mg/dL   eGFR 79 > OR = 60 XN/ATF/5.73U2   BUN/Creatinine Ratio SEE NOTE: 6 - 22 (calc)    Comment:    Not Reported:  BUN and Creatinine are within    reference range. .    Sodium 142 135 - 146 mmol/L   Potassium 5.0 3.5 - 5.3 mmol/L   Chloride 107 98 - 110 mmol/L   CO2 29 20 - 32 mmol/L   Calcium 8.9 8.6 - 10.2 mg/dL   Total Protein 5.5 (L) 6.1 - 8.1 g/dL   Albumin 3.5 (L) 3.6 - 5.1 g/dL   Globulin 2.0 1.9 - 3.7 g/dL (calc)   AG Ratio 1.8 1.0 - 2.5 (calc)   Total Bilirubin 0.2 0.2 - 1.2 mg/dL   Alkaline phosphatase (APISO) 52 31 - 125 U/L   AST 6 (L) 10 - 30 U/L   ALT 12 6 - 29 U/L  BASIC METABOLIC PANEL WITH GFR     Status: None   Collection Time: 01/22/23  8:49 AM  Result Value Ref Range   Glucose, Bld 87 65 - 99 mg/dL  Comment: .            Fasting reference interval .    BUN 12 7 - 25 mg/dL   Creat 2.95 6.21 - 3.08 mg/dL   eGFR 87 > OR = 60 MV/HQI/6.96E9   BUN/Creatinine Ratio SEE NOTE: 6 - 22 (calc)    Comment:    Not Reported: BUN and Creatinine are within    reference range. .    Sodium 142 135 - 146 mmol/L   Potassium 4.3 3.5 - 5.3 mmol/L   Chloride 106 98 - 110 mmol/L   CO2 28 20 - 32 mmol/L   Calcium 9.7 8.6 - 10.2 mg/dL  C-reactive protein     Status: Abnormal   Collection Time: 01/22/23  8:49 AM  Result Value Ref Range   CRP 18.4 (H) <8.0 mg/L  CBC with Differential/Platelet     Status: Abnormal   Collection Time: 01/22/23  8:49 AM  Result Value Ref Range   WBC 8.3 3.8 - 10.8 Thousand/uL   RBC 4.46 3.80 - 5.10 Million/uL   Hemoglobin 13.7 11.7 - 15.5 g/dL   HCT 52.8 41.3 - 24.4 %   MCV 91.9 80.0 - 100.0 fL   MCH 30.7 27.0 - 33.0 pg   MCHC 33.4 32.0 - 36.0 g/dL    Comment: For adults, a slight decrease in the calculated MCHC value (in the range of 30 to 32 g/dL) is most likely not clinically significant; however, it should be interpreted with caution in correlation with other red cell parameters and the patient's clinical condition.    RDW 12.5 11.0 - 15.0 %   Platelets 365 140 - 400 Thousand/uL   MPV 9.8 7.5 - 12.5 fL   Neutro Abs 4,125 1,500 - 7,800  cells/uL   Absolute Lymphocytes 2,822 850 - 3,900 cells/uL   Absolute Monocytes 581 200 - 950 cells/uL   Eosinophils Absolute 681 (H) 15 - 500 cells/uL   Basophils Absolute 91 0 - 200 cells/uL   Neutrophils Relative % 49.7 %   Total Lymphocyte 34.0 %   Monocytes Relative 7.0 %   Eosinophils Relative 8.2 %   Basophils Relative 1.1 %    Diabetic Foot Exam: {Perform Simple Foot Exam  Perform Detailed exam:1} {Insert foot Exam (Optional):30965}   PHQ2/9:    01/24/2023    8:29 AM 01/15/2023   10:25 AM 01/10/2023    8:05 AM 12/17/2022    1:10 PM 11/08/2022    2:57 PM  Depression screen PHQ 2/9  Decreased Interest 0 0  0 0  Down, Depressed, Hopeless 0 0  0 0  PHQ - 2 Score 0 0  0 0  Altered sleeping 0 0  0 0  Tired, decreased energy 0 0  0 0  Change in appetite 0 0  0 0  Feeling bad or failure about yourself  0 0  0 0  Trouble concentrating 0 0  0 0  Moving slowly or fidgety/restless 0 0  0 0  Suicidal thoughts 0 0  0 0  PHQ-9 Score 0 0  0 0  Difficult doing work/chores Not difficult at all Not difficult at all   Not difficult at all     Information is confidential and restricted. Go to Review Flowsheets to unlock data.    phq 9 is {gen pos WNU:[id number]}  Fall Risk:    01/24/2023    8:28 AM 01/15/2023   10:25 AM 12/17/2022    1:09 PM 11/08/2022  2:57 PM 10/30/2022    3:30 PM  Fall Risk   Falls in the past year? 1 1 1  0 1  Number falls in past yr: 0 0 0 0 0  Injury with Fall? 1 0 0 0 0  Risk for fall due to : Impaired balance/gait Impaired balance/gait History of fall(s)  No Fall Risks  Follow up Falls prevention discussed;Falls evaluation completed;Education provided Education provided;Falls evaluation completed;Falls prevention discussed Falls prevention discussed;Education provided;Falls evaluation completed  Falls prevention discussed     {A&P:32071}

## 2023-01-24 NOTE — Progress Notes (Signed)
Name: Barbara Pennington   MRN: [id number]    DOB: 1980/02/04   Date:01/24/2023       Progress Note  Subjective  Chief Complaint  Chief Complaint  Patient presents with   Follow-up    Feeling much better    HPI   CAP: reviewed CXR and labs with patient, imaging showing improvement and she states feeling a little bit better , she is still coughing a lot and would like medication to help with that so she can sleep  Pruritus: skin is dry but has been very itchy lately, she has a history of self picking when stressed but this is different, we will try hydroxyzine . Continue lotion.  Asthma: taking prednisone and Rodena Goldmann and feels it has helped a lot   Patient Active Problem List   Diagnosis Date Noted   High risk medication use 12/17/2022   Chronic cough 02/13/2022   Skin-picking disorder 12/19/2021   Adjustment disorder with depressed mood 06/13/2021   Closed nondisplaced longitudinal fracture of right patella 06/09/2021   GAD (generalized anxiety disorder) 05/05/2021   Depressive disorder 05/05/2021   History of borderline personality disorder 05/05/2021   History of alcohol abuse 05/05/2021   Internal derangement of left knee 04/03/2021   Primary osteoarthritis of left knee 04/03/2021   Metabolic syndrome 03/24/2021   Impaired fasting glucose 12/06/2020   Lymphedema 12/06/2020   Moderate episode of recurrent major depressive disorder (HCC) 02/08/2020   CKD (chronic kidney disease) stage 3, GFR 30-59 ml/min (HCC) 06/11/2019   PTSD (post-traumatic stress disorder) 01/14/2019   Borderline personality disorder (HCC) 01/14/2019   Anxiety 01/14/2019   Mixed hyperlipidemia 01/14/2019   Morbid obesity with BMI of 50.0-59.9, adult (HCC) 01/14/2019   OSA on CPAP 01/14/2019   Asthma, mild persistent 04/30/2018   Low back pain 08/20/2017   Fibroadenoma of breast, 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   Essential hypertension     Social History   Tobacco Use   Smoking status: Former    Current  packs/day: 0.00    Average packs/day: 0.3 packs/day for 10.0 years (2.5 ttl pk-yrs)    Types: Cigarettes    Start date: 07/04/2004    Quit date: 07/05/2014    Years since quitting: 8.5   Smokeless tobacco: Never  Substance Use Topics   Alcohol use: Yes    Comment: rare     Current Outpatient Medications:    acetaminophen (TYLENOL) 500 MG tablet, Take 1,000 mg by mouth every 6 (six) hours as needed for moderate pain (pain score 4-6)., Disp: , Rfl:    Albuterol-Budesonide (AIRSUPRA) 90-80 MCG/ACT AERO, Inhale 2 puffs into the lungs 4 (four) times daily., Disp: 10.7 g, Rfl: 0   amLODipine (NORVASC) 2.5 MG tablet, Take 1 tablet (2.5 mg total) by mouth every morning., Disp: 90 tablet, Rfl: 1   atorvastatin (LIPITOR) 20 MG tablet, Take 1 tablet (20 mg total) by mouth every morning., Disp: 90 tablet, Rfl: 1   buPROPion (WELLBUTRIN XL) 150 MG 24 hr tablet, Take 1 tablet (150 mg total) by mouth daily with breakfast. Stop wellbutrin xl 300 mg, Disp: 90 tablet, Rfl: 0   busPIRone (BUSPAR) 30 MG tablet, Take 1 tablet (30 mg total) by mouth 2 (two) times daily., Disp: 180 tablet, Rfl: 1   desloratadine (CLARINEX) 5 MG tablet, Take 1 tablet (5 mg total) by mouth daily., Disp: 90 tablet, Rfl: 1   escitalopram (LEXAPRO) 20 MG tablet, Take 1 tablet (20 mg total) by mouth daily., Disp: 90 tablet, Rfl: 1  fluticasone (FLONASE) 50 MCG/ACT nasal spray, Place 1 spray into both nostrils daily., Disp: 48 mL, Rfl: 1   Melatonin 10 MG CAPS, Take 10-20 mg by mouth at bedtime., Disp: , Rfl:    meloxicam (MOBIC) 15 MG tablet, Take 15 mg by mouth daily., Disp: , Rfl:    methylPREDNISolone (MEDROL DOSEPAK) 4 MG TBPK tablet, Take as directed, Disp: 21 tablet, Rfl: 0   montelukast (SINGULAIR) 10 MG tablet, Take 1 tablet (10 mg total) by mouth at bedtime., Disp: 90 tablet, Rfl: 1   Semaglutide-Weight Management (WEGOVY) 2.4 MG/0.75ML SOAJ, Inject 2.4 mg into the skin once a week., Disp: 18 mL, Rfl: 0   Solriamfetol HCl  (SUNOSI) 75 MG TABS, Take 1 tablet (75 mg total) by mouth every morning., Disp: 90 tablet, Rfl: 0   valsartan-hydrochlorothiazide (DIOVAN-HCT) 320-12.5 MG tablet, Take 1 tablet by mouth daily., Disp: 90 tablet, Rfl: 0   clonazePAM (KLONOPIN) 0.5 MG tablet, Take 1 tablet (0.5 mg total) by mouth daily as needed for anxiety. Please limit use, Disp: 15 tablet, Rfl: 0   gabapentin (NEURONTIN) 300 MG capsule, Take 1 capsule by mouth 3 (three) times daily., Disp: , Rfl:   Allergies  Allergen Reactions   Latex Rash   Metformin And Related     diarrhea    ROS  Ten systems reviewed and is negative except as mentioned in HPI    Objective  Vitals:   01/24/23 0834  BP: 128/80  Pulse: 83  Resp: 18  Temp: 97.7 F (36.5 C)  TempSrc: Oral  SpO2: 95%  Weight: (!) 311 lb 4.8 oz (141.2 kg)  Height: 5\' 5"  (1.651 m)    Body mass index is 51.8 kg/m.    Physical Exam  Constitutional: Patient appears well-developed and well-nourished. Obese  No distress.  HEENT: head atraumatic, normocephalic, neck supple, throat within normal limits Cardiovascular: Normal rate, regular rhythm and normal heart sounds.  No murmur heard. No BLE edema. Pulmonary/Chest: Effort normal, end inspiratory wheezing on left anterior upper chest . No respiratory distress. Abdominal: Soft.  There is no tenderness. Psychiatric: Patient has a normal mood and affect. behavior is normal. Judgment and thought content normal.  Skin: some excoriation on hands   Recent Results (from the past 2160 hours)  Urine cytology ancillary only     Status: None   Collection Time: 11/08/22  3:10 PM  Result Value Ref Range   Neisseria Gonorrhea Negative    Chlamydia Negative    Comment Normal Reference Ranger Chlamydia - Negative    Comment      Normal Reference Range Neisseria Gonorrhea - Negative  CBC with Differential/Platelet     Status: Abnormal   Collection Time: 11/08/22  3:32 PM  Result Value Ref Range   WBC 12.4 (H) 3.8 -  10.8 Thousand/uL   RBC 4.39 3.80 - 5.10 Million/uL   Hemoglobin 13.6 11.7 - 15.5 g/dL   HCT 40.1 02.7 - 25.3 %   MCV 95.4 80.0 - 100.0 fL   MCH 31.0 27.0 - 33.0 pg   MCHC 32.5 32.0 - 36.0 g/dL    Comment: For adults, a slight decrease in the calculated MCHC value (in the range of 30 to 32 g/dL) is most likely not clinically significant; however, it should be interpreted with caution in correlation with other red cell parameters and the patient's clinical condition.    RDW 12.5 11.0 - 15.0 %   Platelets 308 140 - 400 Thousand/uL   MPV 9.9 7.5 -  12.5 fL   Neutro Abs 8,134 (H) 1,500 - 7,800 cells/uL   Absolute Lymphocytes 3,125 850 - 3,900 cells/uL   Absolute Monocytes 794 200 - 950 cells/uL   Eosinophils Absolute 260 15 - 500 cells/uL   Basophils Absolute 87 0 - 200 cells/uL   Neutrophils Relative % 65.6 %   Total Lymphocyte 25.2 %   Monocytes Relative 6.4 %   Eosinophils Relative 2.1 %   Basophils Relative 0.7 %  COMPLETE METABOLIC PANEL WITH GFR     Status: Abnormal   Collection Time: 11/08/22  3:32 PM  Result Value Ref Range   Glucose, Bld 83 65 - 99 mg/dL    Comment: .            Fasting reference interval .    BUN 12 7 - 25 mg/dL   Creat 1.61 (H) 0.96 - 0.99 mg/dL   eGFR 63 > OR = 60 EA/VWU/9.81X9   BUN/Creatinine Ratio 11 6 - 22 (calc)   Sodium 142 135 - 146 mmol/L   Potassium 4.4 3.5 - 5.3 mmol/L   Chloride 102 98 - 110 mmol/L   CO2 32 20 - 32 mmol/L   Calcium 9.3 8.6 - 10.2 mg/dL   Total Protein 5.8 (L) 6.1 - 8.1 g/dL   Albumin 3.9 3.6 - 5.1 g/dL   Globulin 1.9 1.9 - 3.7 g/dL (calc)   AG Ratio 2.1 1.0 - 2.5 (calc)   Total Bilirubin 0.3 0.2 - 1.2 mg/dL   Alkaline phosphatase (APISO) 45 31 - 125 U/L   AST 8 (L) 10 - 30 U/L   ALT 17 6 - 29 U/L  Lipid panel     Status: Abnormal   Collection Time: 11/08/22  3:32 PM  Result Value Ref Range   Cholesterol 147 <200 mg/dL   HDL 47 (L) > OR = 50 mg/dL   Triglycerides 147 <829 mg/dL   LDL Cholesterol (Calc) 80  mg/dL (calc)    Comment: Reference range: <100 . Desirable range <100 mg/dL for primary prevention;   <70 mg/dL for patients with CHD or diabetic patients  with > or = 2 CHD risk factors. Marland Kitchen LDL-C is now calculated using the Martin-Hopkins  calculation, which is a validated novel method providing  better accuracy than the Friedewald equation in the  estimation of LDL-C.  Horald Pollen et al. Lenox Ahr. 5621;308(65): 2061-2068  (http://education.[facility].com/faq/FAQ164)    Total CHOL/HDL Ratio 3.1 <5.0 (calc)   Non-HDL Cholesterol (Calc) 100 <130 mg/dL (calc)    Comment: For patients with diabetes plus 1 major ASCVD risk  factor, treating to a non-HDL-C goal of <100 mg/dL  (LDL-C of <78 mg/dL) is considered a therapeutic  option.   Hemoglobin A1c     Status: None   Collection Time: 11/08/22  3:32 PM  Result Value Ref Range   Hgb A1c MFr Bld 5.2 <5.7 % of total Hgb    Comment: For the purpose of screening for the presence of diabetes: . <5.7%       Consistent with the absence of diabetes 5.7-6.4%    Consistent with increased risk for diabetes             (prediabetes) > or =6.5%  Consistent with diabetes . This assay result is consistent with a decreased risk of diabetes. . Currently, no consensus exists regarding use of hemoglobin A1c for diagnosis of diabetes in children. . According to American Diabetes Association (ADA) guidelines, hemoglobin A1c <7.0% represents optimal control in non-pregnant diabetic patients.  Different metrics may apply to specific patient populations.  Standards of Medical Care in Diabetes(ADA). .    Mean Plasma Glucose 103 mg/dL   eAG (mmol/L) 5.7 mmol/L  VITAMIN D 25 Hydroxy (Vit-D Deficiency, Fractures)     Status: None   Collection Time: 11/08/22  3:32 PM  Result Value Ref Range   Vit D, 25-Hydroxy 37 30 - 100 ng/mL    Comment: Vitamin D Status         25-OH Vitamin D: . Deficiency:                    <20 ng/mL Insufficiency:              20 - 29 ng/mL Optimal:                 > or = 30 ng/mL . For 25-OH Vitamin D testing on patients on  D2-supplementation and patients for whom quantitation  of D2 and D3 fractions is required, the Quest[employer]ureD(TM) 25-OH VIT D, (D2,D3), LC/MS/MS is recommended: order  code 02725 (patients >14yrs). . See Note 1 . Note 1 . For additional information, please refer to  http://education.[facility].com/faq/FAQ199  (This link is being provided for informational/ educational purposes only.)   Parathyroid hormone, intact (no Ca)     Status: None   Collection Time: 11/08/22  3:32 PM  Result Value Ref Range   PTH 67 16 - 77 pg/mL    Comment: . Interpretive Guide    Intact PTH           Calcium ------------------    ----------           ------- Normal Parathyroid    Normal               Normal Hypoparathyroidism    Low or Low Normal    Low Hyperparathyroidism    Primary            Normal or High       High    Secondary          High                 Normal or Low    Tertiary           High                 High Non-Parathyroid    Hypercalcemia      Low or Low Normal    High .   B12 and Folate Panel     Status: Abnormal   Collection Time: 11/08/22  3:32 PM  Result Value Ref Range   Vitamin B-12 309 200 - 1,100 pg/mL    Comment: . Please Note: Although the reference range for vitamin B12 is [phone] pg/mL, it has been reported that between 5 and 10% of patients with values between 200 and 400 pg/mL may experience neuropsychiatric and hematologic abnormalities due to occult B12 deficiency; less than 1% of patients with values above 400 pg/mL will have symptoms. .    Folate 4.3 (L) ng/mL    Comment:                            Reference Range                            Low:           <  3.4                            Borderline:    3.4-5.4                            Normal:        >5.4 .   HIV Antibody (routine testing w rflx)     Status: None   Collection Time: 11/08/22  3:32  PM  Result Value Ref Range   HIV 1&2 Ab, 4th Generation NON-REACTIVE NON-REACTIVE    Comment: HIV-1 antigen and HIV-1/HIV-2 antibodies were not detected. There is no laboratory evidence of HIV infection. Marland Kitchen PLEASE NOTE: This information has been disclosed to you from records whose confidentiality may be protected by state law.  If your state requires such protection, then the state law prohibits you from making any further disclosure of the information without the specific written consent of the person to whom it pertains, or as otherwise permitted by law. A general authorization for the release of medical or other information is NOT sufficient for this purpose. . For additional information please refer to http://education.[facility].com/faq/FAQ106 (This link is being provided for informational/ educational purposes only.) . Marland Kitchen The performance of this assay has not been clinically validated in patients less than 24 years old. .   RPR     Status: None   Collection Time: 11/08/22  3:32 PM  Result Value Ref Range   RPR Ser Ql NON-REACTIVE NON-REACTIVE    Comment: . No laboratory evidence of syphilis. If recent exposure is suspected, submit a new sample in 2-4 weeks. .   CBG monitoring, ED     Status: Abnormal   Collection Time: 12/12/22  8:28 AM  Result Value Ref Range   Glucose-Capillary 132 (H) 70 - 99 mg/dL    Comment: Glucose reference range applies only to samples taken after fasting for at least 8 hours.  Basic metabolic panel     Status: Abnormal   Collection Time: 12/12/22  8:31 AM  Result Value Ref Range   Sodium 139 135 - 145 mmol/L   Potassium 3.4 (L) 3.5 - 5.1 mmol/L   Chloride 105 98 - 111 mmol/L   CO2 25 22 - 32 mmol/L   Glucose, Bld 131 (H) 70 - 99 mg/dL    Comment: Glucose reference range applies only to samples taken after fasting for at least 8 hours.   BUN 12 6 - 20 mg/dL   Creatinine, Ser 1.51 (H) 0.44 - 1.00 mg/dL   Calcium 9.0 8.9 - 76.1 mg/dL    GFR, Estimated >60 >73 mL/min    Comment: (NOTE) Calculated using the CKD-EPI Creatinine Equation (2021)    Anion gap 9 5 - 15    Comment: Performed at [facility], [address], [place], [place] [address]  CBC     Status: None   Collection Time: 12/12/22  8:31 AM  Result Value Ref Range   WBC 10.0 4.0 - 10.5 K/uL   RBC 4.71 3.87 - 5.11 MIL/uL   Hemoglobin 14.6 12.0 - 15.0 g/dL   HCT 69.4 85.4 - 62.7 %   MCV 94.1 80.0 - 100.0 fL   MCH 31.0 26.0 - 34.0 pg   MCHC 33.0 30.0 - 36.0 g/dL   RDW 03.5 00.9 - 38.1 %   Platelets 277 150 - 400 K/uL   nRBC 0.0 0.0 -  0.2 %    Comment: Performed at [facility], [address] [place], [place] [address]  Troponin I (High Sensitivity)     Status: None   Collection Time: 12/12/22  8:31 AM  Result Value Ref Range   Troponin I (High Sensitivity) 3 <18 ng/L    Comment: (NOTE) Elevated high sensitivity troponin I (hsTnI) values and significant  changes across serial measurements may suggest ACS but many other  chronic and acute conditions are known to elevate hsTnI results.  Refer to the "Links" section for chest pain algorithms and additional  guidance. Performed at [facility], [address], [place], [place] [address]   Urinalysis, Routine w reflex microscopic -Urine, Clean Catch     Status: None   Collection Time: 12/12/22  9:42 AM  Result Value Ref Range   Color, Urine YELLOW YELLOW   APPearance CLEAR CLEAR   Specific Gravity, Urine 1.025 1.005 - 1.030   pH 5.0 5.0 - 8.0   Glucose, UA NEGATIVE NEGATIVE mg/dL   Hgb urine dipstick NEGATIVE NEGATIVE   Bilirubin Urine NEGATIVE NEGATIVE   Ketones, ur NEGATIVE NEGATIVE mg/dL   Protein, ur NEGATIVE NEGATIVE mg/dL   Nitrite NEGATIVE NEGATIVE   Leukocytes,Ua NEGATIVE NEGATIVE    Comment: Performed at [facility], [address], [place], [place] [address]  Urine drugs of abuse scrn w alc, routine (Ref Lab)     Status: None   Collection Time: 12/18/22  5:33  PM  Result Value Ref Range   Amphetamines, Urine Negative Cutoff=1000 ng/mL    Comment: Amphetamine test includes Amphetamine and Methamphetamine.   Barbiturate, Ur Negative Cutoff=300 ng/mL   Benzodiazepine Quant, Ur Negative Cutoff=300 ng/mL   Cannabinoid Quant, Ur Negative Cutoff=50 ng/mL   Cocaine (Metab.) Negative Cutoff=300 ng/mL   Opiate Quant, Ur Negative Cutoff=300 ng/mL    Comment: Opiate test includes Codeine and Morphine only.   Phencyclidine, Ur Negative Cutoff=25 ng/mL   Methadone Screen, Urine Negative Cutoff=300 ng/mL   Propoxyphene, Urine Negative Cutoff=300 ng/mL   Ethanol U, Quan Negative Cutoff=0.020 %    Comment: (NOTE) Performed At: [facility] [address] [place], [place] [id number] Avis Epley PhD OZ:[id number]   Basic metabolic panel     Status: Abnormal   Collection Time: 01/10/23 11:43 AM  Result Value Ref Range   Sodium 134 (L) 135 - 145 mmol/L   Potassium 3.5 3.5 - 5.1 mmol/L   Chloride 104 98 - 111 mmol/L   CO2 19 (L) 22 - 32 mmol/L   Glucose, Bld 100 (H) 70 - 99 mg/dL    Comment: Glucose reference range applies only to samples taken after fasting for at least 8 hours.   BUN 7 6 - 20 mg/dL   Creatinine, Ser 6.29 0.44 - 1.00 mg/dL   Calcium 8.2 (L) 8.9 - 10.3 mg/dL   GFR, Estimated >52 >84 mL/min    Comment: (NOTE) Calculated using the CKD-EPI Creatinine Equation (2021)    Anion gap 11 5 - 15    Comment: Performed at [facility], [address] [place], [place] [address]  CBC     Status: Abnormal   Collection Time: 01/10/23 11:43 AM  Result Value Ref Range   WBC 12.5 (H) 4.0 - 10.5 K/uL   RBC 4.25 3.87 - 5.11 MIL/uL   Hemoglobin 13.2 12.0 - 15.0 g/dL   HCT 01.0 27.2 - 53.6 %   MCV 96.2 80.0 - 100.0 fL  MCH 31.1 26.0 - 34.0 pg   MCHC 32.3 30.0 - 36.0 g/dL   RDW 24.4 01.0 - 27.2 %   Platelets 214 150 - 400 K/uL   nRBC 0.0 0.0 - 0.2 %    Comment: Performed at [facility], [address] [place], [place]  [address]  Troponin I (High Sensitivity)     Status: None   Collection Time: 01/10/23 11:43 AM  Result Value Ref Range   Troponin I (High Sensitivity) 3 <18 ng/L    Comment: (NOTE) Elevated high sensitivity troponin I (hsTnI) values and significant  changes across serial measurements may suggest ACS but many other  chronic and acute conditions are known to elevate hsTnI results.  Refer to the "Links" section for chest pain algorithms and additional  guidance. Performed at [facility], [address] [place], [place] [address]   COMPLETE METABOLIC PANEL WITH GFR     Status: Abnormal   Collection Time: 01/15/23 11:13 AM  Result Value Ref Range   Glucose, Bld 83 65 - 99 mg/dL    Comment: .            Fasting reference interval .    BUN 10 7 - 25 mg/dL   Creat 4.25 9.56 - 3.87 mg/dL   eGFR 79 > OR = 60 FI/EPP/2.95J8   BUN/Creatinine Ratio SEE NOTE: 6 - 22 (calc)    Comment:    Not Reported: BUN and Creatinine are within    reference range. .    Sodium 142 135 - 146 mmol/L   Potassium 5.0 3.5 - 5.3 mmol/L   Chloride 107 98 - 110 mmol/L   CO2 29 20 - 32 mmol/L   Calcium 8.9 8.6 - 10.2 mg/dL   Total Protein 5.5 (L) 6.1 - 8.1 g/dL   Albumin 3.5 (L) 3.6 - 5.1 g/dL   Globulin 2.0 1.9 - 3.7 g/dL (calc)   AG Ratio 1.8 1.0 - 2.5 (calc)   Total Bilirubin 0.2 0.2 - 1.2 mg/dL   Alkaline phosphatase (APISO) 52 31 - 125 U/L   AST 6 (L) 10 - 30 U/L   ALT 12 6 - 29 U/L  BASIC METABOLIC PANEL WITH GFR     Status: None   Collection Time: 01/22/23  8:49 AM  Result Value Ref Range   Glucose, Bld 87 65 - 99 mg/dL    Comment: .            Fasting reference interval .    BUN 12 7 - 25 mg/dL   Creat 8.41 6.60 - 6.30 mg/dL   eGFR 87 > OR = 60 ZS/WFU/9.32T5   BUN/Creatinine Ratio SEE NOTE: 6 - 22 (calc)    Comment:    Not Reported: BUN and Creatinine are within    reference range. .    Sodium 142 135 - 146 mmol/L   Potassium 4.3 3.5 - 5.3 mmol/L   Chloride 106 98 - 110 mmol/L    CO2 28 20 - 32 mmol/L   Calcium 9.7 8.6 - 10.2 mg/dL  C-reactive protein     Status: Abnormal   Collection Time: 01/22/23  8:49 AM  Result Value Ref Range   CRP 18.4 (H) <8.0 mg/L  CBC with Differential/Platelet     Status: Abnormal   Collection Time: 01/22/23  8:49 AM  Result Value Ref Range   WBC 8.3 3.8 - 10.8 Thousand/uL   RBC 4.46 3.80 - 5.10 Million/uL   Hemoglobin 13.7 11.7 - 15.5  g/dL   HCT 18.8 41.6 - 60.6 %   MCV 91.9 80.0 - 100.0 fL   MCH 30.7 27.0 - 33.0 pg   MCHC 33.4 32.0 - 36.0 g/dL    Comment: For adults, a slight decrease in the calculated MCHC value (in the range of 30 to 32 g/dL) is most likely not clinically significant; however, it should be interpreted with caution in correlation with other red cell parameters and the patient's clinical condition.    RDW 12.5 11.0 - 15.0 %   Platelets 365 140 - 400 Thousand/uL   MPV 9.8 7.5 - 12.5 fL   Neutro Abs 4,125 1,500 - 7,800 cells/uL   Absolute Lymphocytes 2,822 850 - 3,900 cells/uL   Absolute Monocytes 581 200 - 950 cells/uL   Eosinophils Absolute 681 (H) 15 - 500 cells/uL   Basophils Absolute 91 0 - 200 cells/uL   Neutrophils Relative % 49.7 %   Total Lymphocyte 34.0 %   Monocytes Relative 7.0 %   Eosinophils Relative 8.2 %   Basophils Relative 1.1 %     Assessment & Plan  1. Community acquired pneumonia of right middle lobe of lung (Primary)  - benzonatate (TESSALON) 100 MG capsule; Take 1-2 capsules (100-200 mg total) by mouth 3 (three) times daily as needed.  Dispense: 60 capsule; Refill: 0  2. Subacute cough  - benzonatate (TESSALON) 100 MG capsule; Take 1-2 capsules (100-200 mg total) by mouth 3 (three) times daily as needed.  Dispense: 60 capsule; Refill: 0  3. Pruritus  - hydrOXYzine (ATARAX) 10 MG tablet; Take 1 tablet (10 mg total) by mouth 3 (three) times daily as needed.  Dispense: 30 tablet; Refill: 0    4. Elevated C-reactive protein (CRP)  We will recheck in a couple of months

## 2023-01-25 ENCOUNTER — Encounter: Payer: Self-pay | Admitting: Family Medicine

## 2023-01-30 ENCOUNTER — Ambulatory Visit: Payer: 59 | Admitting: Family Medicine

## 2023-02-01 ENCOUNTER — Other Ambulatory Visit: Payer: Self-pay | Admitting: Family Medicine

## 2023-02-01 ENCOUNTER — Encounter: Payer: Self-pay | Admitting: Family Medicine

## 2023-02-01 ENCOUNTER — Ambulatory Visit (INDEPENDENT_AMBULATORY_CARE_PROVIDER_SITE_OTHER): Payer: 59 | Admitting: Family Medicine

## 2023-02-01 VITALS — BP 130/84 | HR 91 | Temp 98.3°F | Resp 16 | Ht 65.0 in | Wt 313.8 lb

## 2023-02-01 DIAGNOSIS — E782 Mixed hyperlipidemia: Secondary | ICD-10-CM | POA: Diagnosis not present

## 2023-02-01 DIAGNOSIS — R052 Subacute cough: Secondary | ICD-10-CM

## 2023-02-01 DIAGNOSIS — J4531 Mild persistent asthma with (acute) exacerbation: Secondary | ICD-10-CM

## 2023-02-01 DIAGNOSIS — F331 Major depressive disorder, recurrent, moderate: Secondary | ICD-10-CM

## 2023-02-01 DIAGNOSIS — I10 Essential (primary) hypertension: Secondary | ICD-10-CM | POA: Diagnosis not present

## 2023-02-01 DIAGNOSIS — Z6841 Body Mass Index (BMI) 40.0 and over, adult: Secondary | ICD-10-CM

## 2023-02-01 MED ORDER — VALSARTAN-HYDROCHLOROTHIAZIDE 160-12.5 MG PO TABS: 1.0000 | ORAL_TABLET | Freq: Every day | ORAL | 0 refills | Status: DC

## 2023-02-01 MED ORDER — HYDROCOD POLI-CHLORPHE POLI ER 10-8 MG/5ML PO SUER: 5.0000 mL | Freq: Every evening | ORAL | 0 refills | Status: DC | PRN

## 2023-02-01 MED ORDER — ATORVASTATIN CALCIUM 20 MG PO TABS: 20.0000 mg | ORAL_TABLET | ORAL | 1 refills | Status: DC

## 2023-02-01 MED ORDER — ZEPBOUND 5 MG/0.5ML ~~LOC~~ SOAJ: 5.0000 mg | SUBCUTANEOUS | 0 refills | Status: DC

## 2023-02-01 MED ORDER — ZEPBOUND 2.5 MG/0.5ML ~~LOC~~ SOAJ: 2.5000 mg | SUBCUTANEOUS | 0 refills | Status: DC

## 2023-02-01 MED ORDER — ZEPBOUND 7.5 MG/0.5ML ~~LOC~~ SOAJ: 7.5000 mg | SUBCUTANEOUS | 0 refills | Status: DC

## 2023-02-01 NOTE — Telephone Encounter (Signed)
Requested Prescriptions  Refused Prescriptions Disp Refills   ZEPBOUND 5 MG/0.5ML Pen [Pharmacy Med Name: ZEPBOUND 5 MG/0.5 ML PEN]  0    Sig: INJECT 5 MG SUBCUTANEOUSLY WEEKLY     Off-Protocol Failed - 02/01/2023  5:39 PM      Failed - Medication not assigned to a protocol, review manually.      Passed - Valid encounter within last 12 months    Recent Outpatient Visits           Today Morbid obesity with BMI of 50.0-59.9, adult [facility])   [facility] Alba Cory, MD   1 week ago Community acquired pneumonia of right middle lobe of lung   [facility], Danna Hefty, MD   1 week ago Mild persistent asthma with exacerbation   [facility] Alba Cory, MD   2 weeks ago Community acquired pneumonia of right middle lobe of lung   [facility] Alba Cory, MD   1 month ago Chest pain, unspecified type   [facility] Berniece Salines, FNP       Future Appointments             In 2 weeks [place], Alben Spittle, MD [facility]   In 1 month Alba Cory, MD [facility]   In 9 months Alba Cory, MD [facility], [facility]

## 2023-02-01 NOTE — Progress Notes (Signed)
Name: Barbara Pennington   MRN: [id number]    DOB: 01/08/1981   Date:02/01/2023       Progress Note  Subjective  Chief Complaint  Chief Complaint  Patient presents with   Medical Management of Chronic Issues    Pt stopped ALL medications due to how she was feeling   Dizziness    Random episodes on going for 4 days   HPI   HTN: she stopped taking all her medication on January 2nd, bp today is high, she states she is having dizziness all the time, with or without rest. She states she stopped due to not feeling well . She was taking norvasc 2.5 mg and valsartan hydrochlorothiazide 320/12.5 mg when she stopped taking all of it at once. She states sometimes she feels she is taking too many pills  Hyperlipidemia: she also stopped taking atorvastatin on January 2nd but reviewed labs and she is willing to try taking it again  Major depression recurrent / Anxiety: under the care of Dr. Elna Breslow, stopped taking all her medication at once, explained that is not safe to do with most of the medication but worse when taking psychotropics. It may also be the cause of her fatigue and dizziness, advised to contact Dr. Elna Breslow right away  Recent CAP/Asthma Exacerbation: she is still coughing , we will try low dose cough suppressant at night, continue other medications. Finished steroid pack, wheezing has improved but still has some SOB  Morbid obesity: she has been off Wegovy for one month but wants to resume medications, she would like to try Zepbound, we will have to start at 2.5 mg dose since off GLP-1 for one month. She is trying to eat healthy, not able to be active due to illness and fatigue  CKI stage III a: we will monitor labs     Patient Active Problem List   Diagnosis Date Noted   High risk medication use 12/17/2022   Chronic cough 02/13/2022   Skin-picking disorder 12/19/2021   Adjustment disorder with depressed mood 06/13/2021   Closed nondisplaced longitudinal fracture of right patella  06/09/2021   GAD (generalized anxiety disorder) 05/05/2021   Depressive disorder 05/05/2021   History of borderline personality disorder 05/05/2021   History of alcohol abuse 05/05/2021   Internal derangement of left knee 04/03/2021   Primary osteoarthritis of left knee 04/03/2021   Metabolic syndrome 03/24/2021   Impaired fasting glucose 12/06/2020   Lymphedema 12/06/2020   Moderate episode of recurrent major depressive disorder (HCC) 02/08/2020   CKD (chronic kidney disease) stage 3, GFR 30-59 ml/min (HCC) 06/11/2019   PTSD (post-traumatic stress disorder) 01/14/2019   Borderline personality disorder (HCC) 01/14/2019   Anxiety 01/14/2019   Mixed hyperlipidemia 01/14/2019   Morbid obesity with BMI of 50.0-59.9, adult (HCC) 01/14/2019   OSA on CPAP 01/14/2019   Asthma, mild persistent 04/30/2018   Low back pain 08/20/2017   Fibroadenoma of breast, 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   Essential hypertension     Past Surgical History:  Procedure Laterality Date   ABDOMINAL HYSTERECTOMY  01/08/2006   BREAST BIOPSY Left 01/09/2011   CORE - FIBROADENOMA VS PHYLLODES   CESAREAN SECTION  01/08/2005   COLONOSCOPY WITH PROPOFOL N/A 10/09/2016   Procedure: COLONOSCOPY WITH PROPOFOL;  Surgeon: Wyline Mood, MD;  Location: [facility] ENDOSCOPY;  Service: Gastroenterology;  Laterality: N/A;   COLONOSCOPY WITH PROPOFOL N/A 11/09/2019   Procedure: COLONOSCOPY WITH PROPOFOL;  Surgeon: Toney Reil, MD;  Location: [facility] ENDOSCOPY;  Service: Gastroenterology;  Laterality: N/A;  ESOPHAGOGASTRODUODENOSCOPY     GUM SURGERY  01/08/2002   KNEE ARTHROSCOPY WITH MEDIAL MENISECTOMY Right 07/24/2021   Procedure: Right medial meniscus root repair and patella chondroplasty;  Surgeon: Signa Kell, MD;  Location: [facility] ORS;  Service: Orthopedics;  Laterality: Right;   PLANTAR FASCIA RELEASE Left 08/19/2014   Procedure: ENDOSCOPIC PLANTAR FASCIOTOMY RELEASE L WITH TOPAZ;  Surgeon: Recardo Evangelist, DPM;  Location: [facility];  Service: Podiatry;  Laterality: Left;  LMA WITH POPLITEAL BLOCK   TONSILLECTOMY  01/08/1982    Family History  Problem Relation Age of Onset   Obesity Mother    Diabetes Mother    High Cholesterol Mother    Hypertension Mother    Neuropathy Mother    Depression Mother    Anxiety disorder Mother    Obesity Father    Hypertension Father    High Cholesterol Father    Depression Father    Anxiety disorder Father    Seizures Brother    Testicular cancer Brother    Breast cancer Maternal Grandmother    Hypertension Maternal Grandmother    Aneurysm Maternal Grandmother    Stroke Maternal Grandmother    Hypertension Maternal Grandfather    High Cholesterol Maternal Grandfather    Heart disease Maternal Grandfather        has a pacemaker   Stroke Maternal Grandfather    Alzheimer's disease Paternal Grandmother    Dementia Paternal Grandmother    Colon cancer Paternal Grandfather    Mental illness Maternal Aunt    Suicidality Cousin     Social History   Tobacco Use   Smoking status: Former    Current packs/day: 0.00    Average packs/day: 0.3 packs/day for 10.0 years (2.5 ttl pk-yrs)    Types: Cigarettes    Start date: 07/04/2004    Quit date: 07/05/2014    Years since quitting: 8.5   Smokeless tobacco: Never  Substance Use Topics   Alcohol use: Yes    Comment: rare     Current Outpatient Medications:    acetaminophen (TYLENOL) 500 MG tablet, Take 1,000 mg by mouth every 6 (six) hours as needed for moderate pain (pain score 4-6). (Patient not taking: Reported on 02/01/2023), Disp: , Rfl:    Albuterol-Budesonide (AIRSUPRA) 90-80 MCG/ACT AERO, Inhale 2 puffs into the lungs 4 (four) times daily. (Patient not taking: Reported on 02/01/2023), Disp: 10.7 g, Rfl: 0   amLODipine (NORVASC) 2.5 MG tablet, Take 1 tablet (2.5 mg total) by mouth every morning. (Patient not taking: Reported on 02/01/2023), Disp: 90 tablet, Rfl: 1   atorvastatin (LIPITOR) 20 MG tablet, Take 1 tablet  (20 mg total) by mouth every morning. (Patient not taking: Reported on 02/01/2023), Disp: 90 tablet, Rfl: 1   benzonatate (TESSALON) 100 MG capsule, Take 1-2 capsules (100-200 mg total) by mouth 3 (three) times daily as needed. (Patient not taking: Reported on 02/01/2023), Disp: 60 capsule, Rfl: 0   buPROPion (WELLBUTRIN XL) 150 MG 24 hr tablet, Take 1 tablet (150 mg total) by mouth daily with breakfast. Stop wellbutrin xl 300 mg (Patient not taking: Reported on 02/01/2023), Disp: 90 tablet, Rfl: 0   busPIRone (BUSPAR) 30 MG tablet, Take 1 tablet (30 mg total) by mouth 2 (two) times daily. (Patient not taking: Reported on 02/01/2023), Disp: 180 tablet, Rfl: 1   clonazePAM (KLONOPIN) 0.5 MG tablet, Take 1 tablet (0.5 mg total) by mouth daily as needed for anxiety. Please limit use, Disp: 15 tablet, Rfl: 0   desloratadine (CLARINEX)  5 MG tablet, Take 1 tablet (5 mg total) by mouth daily. (Patient not taking: Reported on 02/01/2023), Disp: 90 tablet, Rfl: 1   escitalopram (LEXAPRO) 20 MG tablet, Take 1 tablet (20 mg total) by mouth daily. (Patient not taking: Reported on 02/01/2023), Disp: 90 tablet, Rfl: 1   fluticasone (FLONASE) 50 MCG/ACT nasal spray, Place 1 spray into both nostrils daily. (Patient not taking: Reported on 02/01/2023), Disp: 48 mL, Rfl: 1   gabapentin (NEURONTIN) 300 MG capsule, Take 1 capsule by mouth 3 (three) times daily., Disp: , Rfl:    hydrOXYzine (ATARAX) 10 MG tablet, Take 1 tablet (10 mg total) by mouth 3 (three) times daily as needed. (Patient not taking: Reported on 02/01/2023), Disp: 30 tablet, Rfl: 0   Melatonin 10 MG CAPS, Take 10-20 mg by mouth at bedtime. (Patient not taking: Reported on 02/01/2023), Disp: , Rfl:    meloxicam (MOBIC) 15 MG tablet, Take 15 mg by mouth daily. (Patient not taking: Reported on 02/01/2023), Disp: , Rfl:    methylPREDNISolone (MEDROL DOSEPAK) 4 MG TBPK tablet, Take as directed (Patient not taking: Reported on 02/01/2023), Disp: 21 tablet, Rfl: 0    montelukast (SINGULAIR) 10 MG tablet, Take 1 tablet (10 mg total) by mouth at bedtime. (Patient not taking: Reported on 02/01/2023), Disp: 90 tablet, Rfl: 1   Semaglutide-Weight Management (WEGOVY) 2.4 MG/0.75ML SOAJ, Inject 2.4 mg into the skin once a week. (Patient not taking: Reported on 02/01/2023), Disp: 18 mL, Rfl: 0   Solriamfetol HCl (SUNOSI) 75 MG TABS, Take 1 tablet (75 mg total) by mouth every morning. (Patient not taking: Reported on 02/01/2023), Disp: 90 tablet, Rfl: 0   valsartan-hydrochlorothiazide (DIOVAN-HCT) 320-12.5 MG tablet, Take 1 tablet by mouth daily. (Patient not taking: Reported on 02/01/2023), Disp: 90 tablet, Rfl: 0  Allergies  Allergen Reactions   Latex Rash   Metformin And Related     diarrhea    I personally reviewed active problem list, medication list, allergies, family history with the patient/caregiver today.   ROS  Ten systems reviewed and is negative except as mentioned in HPI    Objective  Vitals:   02/01/23 1535  BP: (!) 144/94  Pulse: 91  Resp: 16  Temp: 98.3 F (36.8 C)  TempSrc: Oral  SpO2: 99%  Weight: (!) 313 lb 12.8 oz (142.3 kg)  Height: 5\' 5"  (1.651 m)    Body mass index is 52.22 kg/m.  Physical Exam  Constitutional: Patient appears well-developed and well-nourished. Obese  No distress.  HEENT: head atraumatic, normocephalic, pupils equal and reactive to light,  neck supple Cardiovascular: Normal rate, regular rhythm and normal heart sounds.  No murmur heard. No BLE edema. Pulmonary/Chest: Effort normal and breath sounds normal. No respiratory distress. Abdominal: Soft.  There is no tenderness. Psychiatric: Patient has a normal mood and affect. behavior is normal. Judgment and thought content normal.   Recent Results (from the past 2160 hours)  Urine cytology ancillary only     Status: None   Collection Time: 11/08/22  3:10 PM  Result Value Ref Range   Neisseria Gonorrhea Negative    Chlamydia Negative    Comment Normal  Reference Ranger Chlamydia - Negative    Comment      Normal Reference Range Neisseria Gonorrhea - Negative  CBC with Differential/Platelet     Status: Abnormal   Collection Time: 11/08/22  3:32 PM  Result Value Ref Range   WBC 12.4 (H) 3.8 - 10.8 Thousand/uL   RBC 4.39 3.80 -  5.10 Million/uL   Hemoglobin 13.6 11.7 - 15.5 g/dL   HCT 40.9 81.1 - 91.4 %   MCV 95.4 80.0 - 100.0 fL   MCH 31.0 27.0 - 33.0 pg   MCHC 32.5 32.0 - 36.0 g/dL    Comment: For adults, a slight decrease in the calculated MCHC value (in the range of 30 to 32 g/dL) is most likely not clinically significant; however, it should be interpreted with caution in correlation with other red cell parameters and the patient's clinical condition.    RDW 12.5 11.0 - 15.0 %   Platelets 308 140 - 400 Thousand/uL   MPV 9.9 7.5 - 12.5 fL   Neutro Abs 8,134 (H) 1,500 - 7,800 cells/uL   Absolute Lymphocytes 3,125 850 - 3,900 cells/uL   Absolute Monocytes 794 200 - 950 cells/uL   Eosinophils Absolute 260 15 - 500 cells/uL   Basophils Absolute 87 0 - 200 cells/uL   Neutrophils Relative % 65.6 %   Total Lymphocyte 25.2 %   Monocytes Relative 6.4 %   Eosinophils Relative 2.1 %   Basophils Relative 0.7 %  COMPLETE METABOLIC PANEL WITH GFR     Status: Abnormal   Collection Time: 11/08/22  3:32 PM  Result Value Ref Range   Glucose, Bld 83 65 - 99 mg/dL    Comment: .            Fasting reference interval .    BUN 12 7 - 25 mg/dL   Creat 7.82 (H) 9.56 - 0.99 mg/dL   eGFR 63 > OR = 60 OZ/HYQ/6.57Q4   BUN/Creatinine Ratio 11 6 - 22 (calc)   Sodium 142 135 - 146 mmol/L   Potassium 4.4 3.5 - 5.3 mmol/L   Chloride 102 98 - 110 mmol/L   CO2 32 20 - 32 mmol/L   Calcium 9.3 8.6 - 10.2 mg/dL   Total Protein 5.8 (L) 6.1 - 8.1 g/dL   Albumin 3.9 3.6 - 5.1 g/dL   Globulin 1.9 1.9 - 3.7 g/dL (calc)   AG Ratio 2.1 1.0 - 2.5 (calc)   Total Bilirubin 0.3 0.2 - 1.2 mg/dL   Alkaline phosphatase (APISO) 45 31 - 125 U/L   AST 8 (L) 10 - 30  U/L   ALT 17 6 - 29 U/L  Lipid panel     Status: Abnormal   Collection Time: 11/08/22  3:32 PM  Result Value Ref Range   Cholesterol 147 <200 mg/dL   HDL 47 (L) > OR = 50 mg/dL   Triglycerides 696 <295 mg/dL   LDL Cholesterol (Calc) 80 mg/dL (calc)    Comment: Reference range: <100 . Desirable range <100 mg/dL for primary prevention;   <70 mg/dL for patients with CHD or diabetic patients  with > or = 2 CHD risk factors. Marland Kitchen LDL-C is now calculated using the Martin-Hopkins  calculation, which is a validated novel method providing  better accuracy than the Friedewald equation in the  estimation of LDL-C.  Horald Pollen et al. Lenox Ahr. 2841;324(40): 2061-2068  (http://education.[facility].com/faq/FAQ164)    Total CHOL/HDL Ratio 3.1 <5.0 (calc)   Non-HDL Cholesterol (Calc) 100 <130 mg/dL (calc)    Comment: For patients with diabetes plus 1 major ASCVD risk  factor, treating to a non-HDL-C goal of <100 mg/dL  (LDL-C of <10 mg/dL) is considered a therapeutic  option.   Hemoglobin A1c     Status: None   Collection Time: 11/08/22  3:32 PM  Result Value Ref Range   Hgb A1c  MFr Bld 5.2 <5.7 % of total Hgb    Comment: For the purpose of screening for the presence of diabetes: . <5.7%       Consistent with the absence of diabetes 5.7-6.4%    Consistent with increased risk for diabetes             (prediabetes) > or =6.5%  Consistent with diabetes . This assay result is consistent with a decreased risk of diabetes. . Currently, no consensus exists regarding use of hemoglobin A1c for diagnosis of diabetes in children. . According to American Diabetes Association (ADA) guidelines, hemoglobin A1c <7.0% represents optimal control in non-pregnant diabetic patients. Different metrics may apply to specific patient populations.  Standards of Medical Care in Diabetes(ADA). .    Mean Plasma Glucose 103 mg/dL   eAG (mmol/L) 5.7 mmol/L  VITAMIN D 25 Hydroxy (Vit-D Deficiency, Fractures)      Status: None   Collection Time: 11/08/22  3:32 PM  Result Value Ref Range   Vit D, 25-Hydroxy 37 30 - 100 ng/mL    Comment: Vitamin D Status         25-OH Vitamin D: . Deficiency:                    <20 ng/mL Insufficiency:             20 - 29 ng/mL Optimal:                 > or = 30 ng/mL . For 25-OH Vitamin D testing on patients on  D2-supplementation and patients for whom quantitation  of D2 and D3 fractions is required, the QuestAssureD(TM) 25-OH VIT D, (D2,D3), LC/MS/MS is recommended: order  code 16109 (patients >63yrs). . See Note 1 . Note 1 . For additional information, please refer to  http://education.[facility].com/faq/FAQ199  (This link is being provided for informational/ educational purposes only.)   Parathyroid hormone, intact (no Ca)     Status: None   Collection Time: 11/08/22  3:32 PM  Result Value Ref Range   PTH 67 16 - 77 pg/mL    Comment: . Interpretive Guide    Intact PTH           Calcium ------------------    ----------           ------- Normal Parathyroid    Normal               Normal Hypoparathyroidism    Low or Low Normal    Low Hyperparathyroidism    Primary            Normal or High       High    Secondary          High                 Normal or Low    Tertiary           High                 High Non-Parathyroid    Hypercalcemia      Low or Low Normal    High .   B12 and Folate Panel     Status: Abnormal   Collection Time: 11/08/22  3:32 PM  Result Value Ref Range   Vitamin B-12 309 200 - 1,100 pg/mL    Comment: . Please Note: Although the reference range for vitamin B12 is [phone] pg/mL, it has been reported that between 5  and 10% of patients with values between 200 and 400 pg/mL may experience neuropsychiatric and hematologic abnormalities due to occult B12 deficiency; less than 1% of patients with values above 400 pg/mL will have symptoms. .    Folate 4.3 (L) ng/mL    Comment:                            Reference  Range                            Low:           <3.4                            Borderline:    3.4-5.4                            Normal:        >5.4 .   HIV Antibody (routine testing w rflx)     Status: None   Collection Time: 11/08/22  3:32 PM  Result Value Ref Range   HIV 1&2 Ab, 4th Generation NON-REACTIVE NON-REACTIVE    Comment: HIV-1 antigen and HIV-1/HIV-2 antibodies were not detected. There is no laboratory evidence of HIV infection. Marland Kitchen PLEASE NOTE: This information has been disclosed to you from records whose confidentiality may be protected by state law.  If your state requires such protection, then the state law prohibits you from making any further disclosure of the information without the specific written consent of the person to whom it pertains, or as otherwise permitted by law. A general authorization for the release of medical or other information is NOT sufficient for this purpose. . For additional information please refer to http://education.[facility].com/faq/FAQ106 (This link is being provided for informational/ educational purposes only.) . Marland Kitchen The performance of this assay has not been clinically validated in patients less than 47 years old. .   RPR     Status: None   Collection Time: 11/08/22  3:32 PM  Result Value Ref Range   RPR Ser Ql NON-REACTIVE NON-REACTIVE    Comment: . No laboratory evidence of syphilis. If recent exposure is suspected, submit a new sample in 2-4 weeks. .   CBG monitoring, ED     Status: Abnormal   Collection Time: 12/12/22  8:28 AM  Result Value Ref Range   Glucose-Capillary 132 (H) 70 - 99 mg/dL    Comment: Glucose reference range applies only to samples taken after fasting for at least 8 hours.  Basic metabolic panel     Status: Abnormal   Collection Time: 12/12/22  8:31 AM  Result Value Ref Range   Sodium 139 135 - 145 mmol/L   Potassium 3.4 (L) 3.5 - 5.1 mmol/L   Chloride 105 98 - 111 mmol/L   CO2 25 22 -  32 mmol/L   Glucose, Bld 131 (H) 70 - 99 mg/dL    Comment: Glucose reference range applies only to samples taken after fasting for at least 8 hours.   BUN 12 6 - 20 mg/dL   Creatinine, Ser 4.09 (H) 0.44 - 1.00 mg/dL   Calcium 9.0 8.9 - 81.1 mg/dL   GFR, Estimated >91 >47 mL/min    Comment: (NOTE) Calculated using the CKD-EPI Creatinine Equation (2021)    Anion gap 9 5 - 15  Comment: Performed at [facility], [address] [place], [place] [address]  CBC     Status: None   Collection Time: 12/12/22  8:31 AM  Result Value Ref Range   WBC 10.0 4.0 - 10.5 K/uL   RBC 4.71 3.87 - 5.11 MIL/uL   Hemoglobin 14.6 12.0 - 15.0 g/dL   HCT 40.9 81.1 - 91.4 %   MCV 94.1 80.0 - 100.0 fL   MCH 31.0 26.0 - 34.0 pg   MCHC 33.0 30.0 - 36.0 g/dL   RDW 78.2 95.6 - 21.3 %   Platelets 277 150 - 400 K/uL   nRBC 0.0 0.0 - 0.2 %    Comment: Performed at [facility], [address], [place], [place] [address]  Troponin I (High Sensitivity)     Status: None   Collection Time: 12/12/22  8:31 AM  Result Value Ref Range   Troponin I (High Sensitivity) 3 <18 ng/L    Comment: (NOTE) Elevated high sensitivity troponin I (hsTnI) values and significant  changes across serial measurements may suggest ACS but many other  chronic and acute conditions are known to elevate hsTnI results.  Refer to the "Links" section for chest pain algorithms and additional  guidance. Performed at [facility], [address], [place], [place] [address]   Urinalysis, Routine w reflex microscopic -Urine, Clean Catch     Status: None   Collection Time: 12/12/22  9:42 AM  Result Value Ref Range   Color, Urine YELLOW YELLOW   APPearance CLEAR CLEAR   Specific Gravity, Urine 1.025 1.005 - 1.030   pH 5.0 5.0 - 8.0   Glucose, UA NEGATIVE NEGATIVE mg/dL   Hgb urine dipstick NEGATIVE NEGATIVE   Bilirubin Urine NEGATIVE NEGATIVE   Ketones, ur NEGATIVE NEGATIVE mg/dL   Protein, ur NEGATIVE NEGATIVE mg/dL    Nitrite NEGATIVE NEGATIVE   Leukocytes,Ua NEGATIVE NEGATIVE    Comment: Performed at [facility], [address], [place], [place] [address]  Urine drugs of abuse scrn w alc, routine (Ref Lab)     Status: None   Collection Time: 12/18/22  5:33 PM  Result Value Ref Range   Amphetamines, Urine Negative Cutoff=1000 ng/mL    Comment: Amphetamine test includes Amphetamine and Methamphetamine.   Barbiturate, Ur Negative Cutoff=300 ng/mL   Benzodiazepine Quant, Ur Negative Cutoff=300 ng/mL   Cannabinoid Quant, Ur Negative Cutoff=50 ng/mL   Cocaine (Metab.) Negative Cutoff=300 ng/mL   Opiate Quant, Ur Negative Cutoff=300 ng/mL    Comment: Opiate test includes Codeine and Morphine only.   Phencyclidine, Ur Negative Cutoff=25 ng/mL   Methadone Screen, Urine Negative Cutoff=300 ng/mL   Propoxyphene, Urine Negative Cutoff=300 ng/mL   Ethanol U, Quan Negative Cutoff=0.020 %    Comment: (NOTE) Performed At: [facility] [address] [place], [place] [id number] Avis Epley PhD UV:[id number]   Basic metabolic panel     Status: Abnormal   Collection Time: 01/10/23 11:43 AM  Result Value Ref Range   Sodium 134 (L) 135 - 145 mmol/L   Potassium 3.5 3.5 - 5.1 mmol/L   Chloride 104 98 - 111 mmol/L   CO2 19 (L) 22 - 32 mmol/L   Glucose, Bld 100 (H) 70 - 99 mg/dL    Comment: Glucose reference range applies only to samples taken after fasting for at least 8 hours.   BUN 7 6 - 20 mg/dL   Creatinine, Ser 7.42 0.44 - 1.00 mg/dL   Calcium  8.2 (L) 8.9 - 10.3 mg/dL   GFR, Estimated >16 >10 mL/min    Comment: (NOTE) Calculated using the CKD-EPI Creatinine Equation (2021)    Anion gap 11 5 - 15    Comment: Performed at [facility], [address] [place], [place] [address]  CBC     Status: Abnormal   Collection Time: 01/10/23 11:43 AM  Result Value Ref Range   WBC 12.5 (H) 4.0 - 10.5 K/uL   RBC 4.25 3.87 - 5.11 MIL/uL   Hemoglobin 13.2 12.0 - 15.0 g/dL   HCT 40.9 81.1 - 91.4 %    MCV 96.2 80.0 - 100.0 fL   MCH 31.1 26.0 - 34.0 pg   MCHC 32.3 30.0 - 36.0 g/dL   RDW 78.2 95.6 - 21.3 %   Platelets 214 150 - 400 K/uL   nRBC 0.0 0.0 - 0.2 %    Comment: Performed at [facility], [address] [place], [place] [address]  Troponin I (High Sensitivity)     Status: None   Collection Time: 01/10/23 11:43 AM  Result Value Ref Range   Troponin I (High Sensitivity) 3 <18 ng/L    Comment: (NOTE) Elevated high sensitivity troponin I (hsTnI) values and significant  changes across serial measurements may suggest ACS but many other  chronic and acute conditions are known to elevate hsTnI results.  Refer to the "Links" section for chest pain algorithms and additional  guidance. Performed at [facility], [address] [place], [place] [address]   COMPLETE METABOLIC PANEL WITH GFR     Status: Abnormal   Collection Time: 01/15/23 11:13 AM  Result Value Ref Range   Glucose, Bld 83 65 - 99 mg/dL    Comment: .            Fasting reference interval .    BUN 10 7 - 25 mg/dL   Creat 2.95 2.84 - 1.32 mg/dL   eGFR 79 > OR = 60 GM/WNU/2.72Z3   BUN/Creatinine Ratio SEE NOTE: 6 - 22 (calc)    Comment:    Not Reported: BUN and Creatinine are within    reference range. .    Sodium 142 135 - 146 mmol/L   Potassium 5.0 3.5 - 5.3 mmol/L   Chloride 107 98 - 110 mmol/L   CO2 29 20 - 32 mmol/L   Calcium 8.9 8.6 - 10.2 mg/dL   Total Protein 5.5 (L) 6.1 - 8.1 g/dL   Albumin 3.5 (L) 3.6 - 5.1 g/dL   Globulin 2.0 1.9 - 3.7 g/dL (calc)   AG Ratio 1.8 1.0 - 2.5 (calc)   Total Bilirubin 0.2 0.2 - 1.2 mg/dL   Alkaline phosphatase (APISO) 52 31 - 125 U/L   AST 6 (L) 10 - 30 U/L   ALT 12 6 - 29 U/L  BASIC METABOLIC PANEL WITH GFR     Status: None   Collection Time: 01/22/23  8:49 AM  Result Value Ref Range   Glucose, Bld 87 65 - 99 mg/dL    Comment: .            Fasting reference interval .    BUN 12 7 - 25 mg/dL   Creat 6.64 4.03 - 4.74 mg/dL   eGFR 87 >  OR = 60 QV/ZDG/3.87F6   BUN/Creatinine Ratio SEE NOTE: 6 - 22 (calc)    Comment:    Not Reported: BUN and Creatinine are within    reference range. Marland Kitchen  Sodium 142 135 - 146 mmol/L   Potassium 4.3 3.5 - 5.3 mmol/L   Chloride 106 98 - 110 mmol/L   CO2 28 20 - 32 mmol/L   Calcium 9.7 8.6 - 10.2 mg/dL  C-reactive protein     Status: Abnormal   Collection Time: 01/22/23  8:49 AM  Result Value Ref Range   CRP 18.4 (H) <8.0 mg/L  CBC with Differential/Platelet     Status: Abnormal   Collection Time: 01/22/23  8:49 AM  Result Value Ref Range   WBC 8.3 3.8 - 10.8 Thousand/uL   RBC 4.46 3.80 - 5.10 Million/uL   Hemoglobin 13.7 11.7 - 15.5 g/dL   HCT 40.9 81.1 - 91.4 %   MCV 91.9 80.0 - 100.0 fL   MCH 30.7 27.0 - 33.0 pg   MCHC 33.4 32.0 - 36.0 g/dL    Comment: For adults, a slight decrease in the calculated MCHC value (in the range of 30 to 32 g/dL) is most likely not clinically significant; however, it should be interpreted with caution in correlation with other red cell parameters and the patient's clinical condition.    RDW 12.5 11.0 - 15.0 %   Platelets 365 140 - 400 Thousand/uL   MPV 9.8 7.5 - 12.5 fL   Neutro Abs 4,125 1,500 - 7,800 cells/uL   Absolute Lymphocytes 2,822 850 - 3,900 cells/uL   Absolute Monocytes 581 200 - 950 cells/uL   Eosinophils Absolute 681 (H) 15 - 500 cells/uL   Basophils Absolute 91 0 - 200 cells/uL   Neutrophils Relative % 49.7 %   Total Lymphocyte 34.0 %   Monocytes Relative 7.0 %   Eosinophils Relative 8.2 %   Basophils Relative 1.1 %    Diabetic Foot Exam:     PHQ2/9:    01/24/2023    8:29 AM 01/15/2023   10:25 AM 01/10/2023    8:05 AM 12/17/2022    1:10 PM 11/08/2022    2:57 PM  Depression screen PHQ 2/9  Decreased Interest 0 0  0 0  Down, Depressed, Hopeless 0 0  0 0  PHQ - 2 Score 0 0  0 0  Altered sleeping 0 0  0 0  Tired, decreased energy 0 0  0 0  Change in appetite 0 0  0 0  Feeling bad or failure about yourself  0 0  0 0   Trouble concentrating 0 0  0 0  Moving slowly or fidgety/restless 0 0  0 0  Suicidal thoughts 0 0  0 0  PHQ-9 Score 0 0  0 0  Difficult doing work/chores Not difficult at all Not difficult at all   Not difficult at all     Information is confidential and restricted. Go to Review Flowsheets to unlock data.    phq 9 is negative  Fall Risk:    01/24/2023    8:28 AM 01/15/2023   10:25 AM 12/17/2022    1:09 PM 11/08/2022    2:57 PM 10/30/2022    3:30 PM  Fall Risk   Falls in the past year? 1 1 1  0 1  Number falls in past yr: 0 0 0 0 0  Injury with Fall? 1 0 0 0 0  Risk for fall due to : Impaired balance/gait Impaired balance/gait History of fall(s)  No Fall Risks  Follow up Falls prevention discussed;Falls evaluation completed;Education provided Education provided;Falls evaluation completed;Falls prevention discussed Falls prevention discussed;Education provided;Falls evaluation completed  Falls prevention discussed  Assessment & Plan   1. Morbid obesity with BMI of 50.0-59.9, adult (HCC)  - tirzepatide (ZEPBOUND) 2.5 MG/0.5ML Pen; Inject 2.5 mg into the skin once a week.  Dispense: 2 mL; Refill: 0 - tirzepatide (ZEPBOUND) 5 MG/0.5ML Pen; Inject 5 mg into the skin once a week.  Dispense: 2 mL; Refill: 0 - tirzepatide (ZEPBOUND) 7.5 MG/0.5ML Pen; Inject 7.5 mg into the skin once a week.  Dispense: 2 mL; Refill: 0  2. Moderate episode of recurrent major depressive disorder (HCC)  Contact Dr. Elna Breslow  3. Mixed hyperlipidemia  - atorvastatin (LIPITOR) 20 MG tablet; Take 1 tablet (20 mg total) by mouth every morning.  Dispense: 90 tablet; Refill: 1  4. Essential hypertension  - valsartan-hydrochlorothiazide (DIOVAN-HCT) 160-12.5 MG tablet; Take 1 tablet by mouth daily.  Dispense: 90 tablet; Refill: 0  5. Mild persistent asthma with exacerbation  Continue medications  6. Subacute cough  - chlorpheniramine-HYDROcodone (TUSSIONEX) 10-8 MG/5ML; Take 5 mLs by mouth at bedtime  as needed for cough.  Dispense: 70 mL; Refill: 0

## 2023-02-04 ENCOUNTER — Other Ambulatory Visit: Payer: Self-pay | Admitting: Family Medicine

## 2023-02-04 ENCOUNTER — Encounter: Payer: Self-pay | Admitting: Family Medicine

## 2023-02-04 DIAGNOSIS — J189 Pneumonia, unspecified organism: Secondary | ICD-10-CM

## 2023-02-04 DIAGNOSIS — R052 Subacute cough: Secondary | ICD-10-CM

## 2023-02-04 MED ORDER — BENZONATATE 100 MG PO CAPS: 100.0000 mg | ORAL_CAPSULE | Freq: Three times a day (TID) | ORAL | 0 refills | Status: DC | PRN

## 2023-02-05 ENCOUNTER — Ambulatory Visit (INDEPENDENT_AMBULATORY_CARE_PROVIDER_SITE_OTHER): Payer: PRIVATE HEALTH INSURANCE | Admitting: Professional Counselor

## 2023-02-05 ENCOUNTER — Institutional Professional Consult (permissible substitution): Payer: 59 | Admitting: Pulmonary Disease

## 2023-02-05 ENCOUNTER — Ambulatory Visit: Payer: 59 | Admitting: Pulmonary Disease

## 2023-02-05 ENCOUNTER — Encounter: Payer: Self-pay | Admitting: Pulmonary Disease

## 2023-02-05 VITALS — BP 124/86 | HR 74 | Temp 97.7°F | Ht 65.0 in | Wt 315.2 lb

## 2023-02-05 DIAGNOSIS — F603 Borderline personality disorder: Secondary | ICD-10-CM | POA: Diagnosis not present

## 2023-02-05 DIAGNOSIS — J4530 Mild persistent asthma, uncomplicated: Secondary | ICD-10-CM

## 2023-02-05 LAB — NITRIC OXIDE: Nitric Oxide: 36

## 2023-02-05 MED ORDER — BUDESONIDE-FORMOTEROL FUMARATE 160-4.5 MCG/ACT IN AERO: 2.0000 | INHALATION_SPRAY | Freq: Two times a day (BID) | RESPIRATORY_TRACT | 12 refills | Status: DC

## 2023-02-05 NOTE — Progress Notes (Addendum)
THERAPIST PROGRESS NOTE  Virtual Visit via Video Note  I connected with Barbara Pennington on 02/05/23 at 8:12 AM EST by a video enabled telemedicine application and verified that I am speaking with the correct person using two identifiers.  Location: Patient: Home Provider: Office   I discussed the limitations of evaluation and management by telemedicine and the availability of in person appointments. The patient expressed understanding and agreed to proceed.  I discussed the assessment and treatment plan with the patient. The patient was provided an opportunity to ask questions and all were answered. The patient agreed with the plan and demonstrated an understanding of the instructions.   The patient was advised to call back or seek an in-person evaluation if the symptoms worsen or if the condition fails to improve as anticipated.  I provided 50 minutes of non-face-to-face time during this encounter. Barbara Pennington, [facility]  Session Time: 8:12 AM - 9:02 AM  Participation Level: Active  Behavioral Response: Casual, Alert, Dysphoric  Type of Therapy: Individual Therapy  Treatment Goals addressed: Active Anxiety  LTG: "I guess working through all the issues from the past. Learning how to better cope with stress."    Start:  02/05/23   End:  02/04/24  STG: "I don't deal with stress well. I get overwhelmed. I shut down. I get angry. I can't communicate my thoughts." To improve knowledge of DBT skills to reduce sxs per self report over the next 12 weeks.    ProgressTowards Goals: Initial  Interventions: CBT, Motivational Interviewing, and Other: psychoeducation  Summary: Barbara Pennington is a 43 y.o. female who presents with a history of MDD, GAD, PTSD, BPD, and skin-picking disorder. [name] appeared alert and oriented x5. She noted she has been sick with pneumonia and out of work most of the month. She is due to return to work in February and noted she is feeling much better. Shalea was  unsure initially what goals she has for therapy. She discussed some of her previous therapeutic experiences and engaged in discussion with this Clinical research associate to identify her goals. She actively listened to psychoeducation and expressed an interest in learning DBT skills. She noted handouts are helpful for her. [name] appeared hopeful these skills will benefit her. She was in agreement to complete handouts from orientation module. [name] agreed to bi-weekly sessions.   Therapist Response: Conducted telehealth session with Barbara Pennington. Began session with check-in/update since previous session. Used empathetic and reflective listening. Developed treatment plan with input from Barbara Pennington on current strengths, needs, and progress towards goals. Obtained verbal consent for e-signature on consent form. Provided psychoeducation on DBT skills and reviewed orientation module of DBT Skill manual. Encouraged Barbara Pennington to complete worksheets attached to orientation module including identifying personal goals, pros/cons, and behavior chain analysis. Scheduled next appointment and concluded session. Printed handouts/worksheets to be mailed to Barbara Pennington.   Suicidal/Homicidal: No  Plan: Return again in 2 weeks.  Diagnosis: Borderline personality disorder (HCC)  Collaboration of Care: Medication Management AEB chart review  Patient/Guardian was advised Release of Information must be obtained prior to any record release in order to collaborate their care with an outside provider. Patient/Guardian was advised if they have not already done so to contact the registration department to sign all necessary forms in order for Barbara Pennington to release information regarding their care.   Consent: Patient/Guardian gives verbal consent for treatment and assignment of benefits for services provided during this visit. Patient/Guardian expressed understanding and agreed to proceed.   [name]  Pricilla Loveless, [facility] 02/05/2023

## 2023-02-06 NOTE — Progress Notes (Signed)
Synopsis: Referred in by Alba Cory, MD   Subjective:   PATIENT ID: Barbara Pennington GENDER: female DOB: May 06, 1980, MRN: [id number]  Chief Complaint  Patient presents with   Follow-up    ED for Pneumonia on 1/2. DOE. Some wheezing. Cough, dry.    HPI [name] is a 43 year old female patient with a past medical history of intermittent asthma presenting today to the pulmonary clinic for ongoing cough post CAP.   She presented to the ED earlier in January for chest pain and chest tightness associated with cough and shortness of breath. CXR at the time with Right lower lung opacity. She was given Augmentin and Azithromycin.   She did well and discharged home. Repeat CXR 01/14 with significantly improved right lung base opacity.   Today, reports persistent dry cough with some shortness of breath on exertion.   PFTs 02/2022 - Overall normal, possible response to bronchodilators but does not meet ATS criteria. Lung volumes were increased suggesting air trapping.   Family history - Negative for lung disease   Social history - Ex smoker quit in 2016 smoked 5 cig a day for 10 years.    ROS All systems were reviewed and are negative except for the above.  Objective:   Vitals:   02/05/23 1548  BP: 124/86  Pulse: 74  Temp: 97.7 F (36.5 C)  SpO2: 98%  Weight: (!) 315 lb 3.2 oz (143 kg)  Height: 5\' 5"  (1.651 m)   98% on RA BMI Readings from Last 3 Encounters:  02/05/23 52.45 kg/m  02/01/23 52.22 kg/m  01/24/23 51.80 kg/m   Wt Readings from Last 3 Encounters:  02/05/23 (!) 315 lb 3.2 oz (143 kg)  02/01/23 (!) 313 lb 12.8 oz (142.3 kg)  01/24/23 (!) 311 lb 4.8 oz (141.2 kg)    Physical Exam GEN: NAD, Healthy Appearing HEENT: Supple Neck, Reactive Pupils, EOMI  CVS: Normal S1, Normal S2, RRR, No murmurs or ES appreciated  Lungs: Clear bilateral air entry.  Abdomen: Soft, non tender, non distended, + BS  Extremities: Warm and well perfused, No edema  Skin: No  suspicious lesions appreciated  Psych: Normal Affect  Ancillary Information   CBC    Component Value Date/Time   WBC 8.3 01/22/2023 0849   RBC 4.46 01/22/2023 0849   HGB 13.7 01/22/2023 0849   HGB 13.5 08/26/2013 1034   HCT 41.0 01/22/2023 0849   HCT 41.9 08/26/2013 1034   PLT 365 01/22/2023 0849   PLT 183 08/26/2013 1034   MCV 91.9 01/22/2023 0849   MCV 95 08/26/2013 1034   MCH 30.7 01/22/2023 0849   MCHC 33.4 01/22/2023 0849   RDW 12.5 01/22/2023 0849   RDW 12.6 08/26/2013 1034   LYMPHSABS 2,511 12/15/2021 0828   LYMPHSABS 2.0 01/06/2013 1910   MONOABS 0.8 06/20/2020 2138   MONOABS 0.5 01/06/2013 1910   EOSABS 681 (H) 01/22/2023 0849   EOSABS 0.2 01/06/2013 1910   BASOSABS 91 01/22/2023 0849   BASOSABS 0.0 01/06/2013 1910   Labs and imaging were reviewed.     Latest Ref Rng & Units 02/13/2022    3:39 PM  PFT Results  FVC-Pre L 3.08   FVC-Predicted Pre % 82   FVC-Post L 3.25   FVC-Predicted Post % 86   Pre FEV1/FVC % % 83   Post FEV1/FCV % % 83   FEV1-Pre L 2.54   FEV1-Predicted Pre % 84   FEV1-Post L 2.69   DLCO uncorrected ml/min/mmHg 23.17  DLCO UNC% % 104   DLVA Predicted % 105   TLC L 6.07   TLC % Predicted % 118   RV % Predicted % 149      Assessment & Plan:  Ms. Kinter is a 43 year old female patient with a past medical history of intermittent asthma presenting today to the pulmonary clinic for ongoing cough post CAP.   #Acute bronchitis in the setting of recent CAP (Reassured that this should improve within the next 3 weeks).  #Mild intermittent asthma ( I think she would benefit from maintenance therapy given possible airtrapping on PFTs, consistent dry cough and dyspnea on exertion).   []  Start budesonide-formoterol [Symbicort] 160-4.5 2puffs twice a day.  []  C/w Airsupra as needed.   Return in about 3 months (around 05/06/2023).  I spent 60 minutes caring for this patient today, including preparing to see the patient, obtaining a medical  history , reviewing a separately obtained history, performing a medically appropriate examination and/or evaluation, counseling and educating the patient/family/caregiver, ordering medications, tests, or procedures, documenting clinical information in the electronic health record, and independently interpreting results (not separately reported/billed) and communicating results to the patient/family/caregiver  Janann Colonel, MD [facility] 02/06/2023 8:56 AM

## 2023-02-08 ENCOUNTER — Other Ambulatory Visit: Payer: Self-pay | Admitting: Family Medicine

## 2023-02-11 ENCOUNTER — Other Ambulatory Visit: Payer: Self-pay | Admitting: Psychiatry

## 2023-02-11 ENCOUNTER — Other Ambulatory Visit: Payer: Self-pay | Admitting: Family Medicine

## 2023-02-11 DIAGNOSIS — J4531 Mild persistent asthma with (acute) exacerbation: Secondary | ICD-10-CM

## 2023-02-11 DIAGNOSIS — F4310 Post-traumatic stress disorder, unspecified: Secondary | ICD-10-CM

## 2023-02-11 DIAGNOSIS — I10 Essential (primary) hypertension: Secondary | ICD-10-CM

## 2023-02-13 ENCOUNTER — Telehealth: Payer: Self-pay

## 2023-02-13 ENCOUNTER — Other Ambulatory Visit: Payer: Self-pay | Admitting: Family Medicine

## 2023-02-13 ENCOUNTER — Encounter: Payer: Self-pay | Admitting: Family Medicine

## 2023-02-13 DIAGNOSIS — G4733 Obstructive sleep apnea (adult) (pediatric): Secondary | ICD-10-CM

## 2023-02-13 DIAGNOSIS — F4310 Post-traumatic stress disorder, unspecified: Secondary | ICD-10-CM

## 2023-02-13 DIAGNOSIS — F411 Generalized anxiety disorder: Secondary | ICD-10-CM

## 2023-02-13 DIAGNOSIS — R052 Subacute cough: Secondary | ICD-10-CM

## 2023-02-13 MED ORDER — SUNOSI 75 MG PO TABS: 1.0000 | ORAL_TABLET | Freq: Every morning | ORAL | 0 refills | Status: DC

## 2023-02-13 MED ORDER — HYDROCOD POLI-CHLORPHE POLI ER 10-8 MG/5ML PO SUER: 5.0000 mL | Freq: Every evening | ORAL | 0 refills | Status: DC | PRN

## 2023-02-13 NOTE — Telephone Encounter (Signed)
 Patient requested a refill on the following medication please advise   clonazePAM  (KLONOPIN ) 0.5 MG tablet    Last visit 01-24-23 Next visit 03-26-23    Preferred Pharmacies   [id number] - [place], [place] - [address] Phone: [phone]  Fax: [phone]

## 2023-02-14 ENCOUNTER — Encounter: Payer: Self-pay | Admitting: Family Medicine

## 2023-02-14 MED ORDER — CLONAZEPAM 0.5 MG PO TABS: 0.5000 mg | ORAL_TABLET | Freq: Every day | ORAL | 0 refills | Status: DC | PRN

## 2023-02-14 NOTE — Telephone Encounter (Signed)
 Copied from CRM [id number]. Topic: General - Other >> Feb 14, 2023  9:14 AM Barbara Pennington wrote: Reason for CRM: Pt stated that she spoke with her pharmacy and she is being told that the request for prior authorization for Zepbound  was sent but they have not received a response. Pt stated that she also contacted her insurance who also stated that a prior authorization is needed so she would like a return call to let her know what is going on.

## 2023-02-14 NOTE — Telephone Encounter (Signed)
 PA has been done through Cover my meds waiting on insurance to determine

## 2023-02-14 NOTE — Telephone Encounter (Signed)
 I have sent clonazepam  refill to [facility] as requested.

## 2023-02-14 NOTE — Telephone Encounter (Signed)
 Copied from CRM [id number]. Topic: General - Other >> Feb 14, 2023  9:20 AM Barbara Pennington wrote: Reason for CRM: Rice Chamorro from [facility] called regarding the Prior Auth needed for Zepbound , states that PCP needs to contact his phone number to resovle   [phone]

## 2023-02-14 NOTE — Addendum Note (Signed)
 Addended byPearl Bottcher on: 02/14/2023 08:28 AM   Modules accepted: Orders

## 2023-02-15 ENCOUNTER — Encounter: Payer: Self-pay | Admitting: Family Medicine

## 2023-02-15 NOTE — Progress Notes (Signed)
 Cardiology Office Note:    Date:  02/18/2023   ID:  TIANNAH PAGANINI, DOB 07/18/1980, MRN [id number]  PCP:  Sowles, Krichna, MD   [facility] Providers Cardiologist:  None     Referring MD: Arleen Lacer, MD   Chief Complaint  Patient presents with   Chest Pain   Palpitations    History of Present Illness:    Barbara Pennington is a 43 y.o. female seen at the request of Dr Ava Lei for evaluation of chest pain and palpitations. She has a history of HTN, HLD, CKD stage 3 and OSA.   She was seen in the ED in early Dec with sharp chest pain that awoke her from sleep and lasted all day. She has had sharp pain for a couple of years but typically only lasts 1-2 minutes. Evaluation in the ED was negative including troponin, labs, Ecg and CXR. She had an Echo done and other than mild LVH this was normal.  An event monitor showed rare ectopy with 5 brief runs of SVT longest 11 beats. She returned to ED in early Jan with PNA. She does have some asthma.   Past Medical History:  Diagnosis Date   Anxiety    Arthritis 2006   rhuematoid - mild - no current issues   Asthma 2003   Borderline personality disorder (HCC)    Chronic kidney disease    stage 3   Complication of anesthesia    "Usually" has low temp after surgery.After hysterectomy temp was 94   Depression    GERD (gastroesophageal reflux disease)    History of kidney stones    Hyperlipidemia    Hypertension 2013   Lump or mass in breast 2013   RIGHT BREAST   Motion sickness    repeated amusement park rides   PTSD (post-traumatic stress disorder)    Scoliosis    no current issues   Shortness of breath dyspnea    secondary to weight    Sleep apnea    uses cpap   Ulcer 2001    Past Surgical History:  Procedure Laterality Date   ABDOMINAL HYSTERECTOMY  01/08/2006   BREAST BIOPSY Left 01/09/2011   CORE - FIBROADENOMA VS PHYLLODES   CESAREAN SECTION  01/08/2005   COLONOSCOPY WITH PROPOFOL  N/A 10/09/2016   Procedure:  COLONOSCOPY WITH PROPOFOL ;  Surgeon: Luke Salaam, MD;  Location: [facility] ENDOSCOPY;  Service: Gastroenterology;  Laterality: N/A;   COLONOSCOPY WITH PROPOFOL  N/A 11/09/2019   Procedure: COLONOSCOPY WITH PROPOFOL ;  Surgeon: Selena Daily, MD;  Location: [facility] ENDOSCOPY;  Service: Gastroenterology;  Laterality: N/A;   ESOPHAGOGASTRODUODENOSCOPY     GUM SURGERY  01/08/2002   KNEE ARTHROSCOPY WITH MEDIAL MENISECTOMY Right 07/24/2021   Procedure: Right medial meniscus root repair and patella chondroplasty;  Surgeon: Lorri Rota, MD;  Location: [facility] ORS;  Service: Orthopedics;  Laterality: Right;   PLANTAR FASCIA RELEASE Left 08/19/2014   Procedure: ENDOSCOPIC PLANTAR FASCIOTOMY RELEASE L WITH TOPAZ;  Surgeon: Sharlyn Deaner, DPM;  Location: [facility];  Service: Podiatry;  Laterality: Left;  LMA WITH POPLITEAL BLOCK   TONSILLECTOMY  01/08/1982    Current Medications: Current Meds  Medication Sig   acetaminophen  (TYLENOL ) 500 MG tablet Take 1,000 mg by mouth every 6 (six) hours as needed for moderate pain (pain score 4-6).   Albuterol -Budesonide  (AIRSUPRA ) 90-80 MCG/ACT AERO Inhale 2 puffs into the lungs 4 (four) times daily as needed.   atorvastatin  (LIPITOR) 20 MG tablet Take 1 tablet (20 mg  total) by mouth every morning.   benzonatate  (TESSALON ) 100 MG capsule Take 1-2 capsules (100-200 mg total) by mouth 3 (three) times daily as needed.   budesonide -formoterol  (SYMBICORT ) 160-4.5 MCG/ACT inhaler Inhale 2 puffs into the lungs in the morning and at bedtime.   buPROPion  (WELLBUTRIN  XL) 150 MG 24 hr tablet TAKE 1 TABLET (150 MG TOTAL) BY MOUTH DAILY WITH BREAKFAST. STOP WELLBUTRIN  XL 300 MG   busPIRone  (BUSPAR ) 30 MG tablet Take 1 tablet (30 mg total) by mouth 2 (two) times daily.   chlorpheniramine-HYDROcodone  (TUSSIONEX) 10-8 MG/5ML Take 5 mLs by mouth at bedtime as needed for cough.   desloratadine  (CLARINEX ) 5 MG tablet Take 1 tablet (5 mg total) by mouth daily.   escitalopram   (LEXAPRO ) 20 MG tablet Take 1 tablet (20 mg total) by mouth daily.   fluticasone  (FLONASE ) 50 MCG/ACT nasal spray Place 1 spray into both nostrils daily.   Melatonin 10 MG CAPS Take 10-20 mg by mouth at bedtime.   meloxicam  (MOBIC ) 15 MG tablet Take 15 mg by mouth daily.   montelukast  (SINGULAIR ) 10 MG tablet Take 1 tablet (10 mg total) by mouth at bedtime.   Solriamfetol  HCl (SUNOSI ) 75 MG TABS Take 1 tablet (75 mg total) by mouth every morning.   valsartan -hydrochlorothiazide  (DIOVAN -HCT) 160-12.5 MG tablet TAKE 1 TABLET BY MOUTH EVERY DAY     Allergies:   Latex and Metformin and related   Social History   Socioeconomic History   Marital status: Divorced    Spouse name: Not on file   Number of children: 2   Years of education: 14   Highest education level: Bachelor's degree (e.g., BA, AB, BS)  Occupational History   Not on file  Tobacco Use   Smoking status: Former    Current packs/day: 0.00    Average packs/day: 0.3 packs/day for 10.0 years (2.5 ttl pk-yrs)    Types: Cigarettes    Start date: 07/04/2004    Quit date: 07/05/2014    Years since quitting: 8.6   Smokeless tobacco: Never  Vaping Use   Vaping status: Never Used  Substance and Sexual Activity   Alcohol use: Yes    Comment: rare   Drug use: No   Sexual activity: Yes    Partners: Male    Birth control/protection: Surgical    Comment: 2007  Other Topics Concern   Not on file  Social History Narrative   Not on file   Social Drivers of Health   Financial Resource Strain: High Risk (10/29/2022)   Overall Financial Resource Strain (CARDIA)    Difficulty of Paying Living Expenses: Hard  Food Insecurity: Food Insecurity Present (10/29/2022)   Hunger Vital Sign    Worried About Running Out of Food in the Last Year: Sometimes true    Ran Out of Food in the Last Year: Never true  Transportation Needs: No Transportation Needs (10/29/2022)   PRAPARE - Administrator, Civil Service (Medical): No    Lack  of Transportation (Non-Medical): No  Physical Activity: Sufficiently Active (10/29/2022)   Exercise Vital Sign    Days of Exercise per Week: 3 days    Minutes of Exercise per Session: 90 min  Stress: Stress Concern Present (10/29/2022)   [employer] of Occupational Health - Occupational Stress Questionnaire    Feeling of Stress : To some extent  Social Connections: Moderately Isolated (10/29/2022)   Social Connection and Isolation Panel [NHANES]    Frequency of Communication with Friends and Family: More  than three times a week    Frequency of Social Gatherings with Friends and Family: Once a week    Attends Religious Services: Never    Database administrator or Organizations: No    Attends Engineer, structural: Not on file    Marital Status: Living with partner     Family History: The patient's family history includes Alzheimer's disease in her paternal grandmother; Aneurysm in her maternal grandmother; Anxiety disorder in her father and mother; Breast cancer in her maternal grandmother; Colon cancer in her paternal grandfather; Dementia in her paternal grandmother; Depression in her father and mother; Diabetes in her mother; Heart disease in her maternal grandfather; High Cholesterol in her father, maternal grandfather, and mother; Hypertension in her father, maternal grandfather, maternal grandmother, and mother; Mental illness in her maternal aunt; Neuropathy in her mother; Obesity in her father and mother; Seizures in her brother; Stroke in her maternal grandfather and maternal grandmother; Suicidality in her cousin; Testicular cancer in her brother.  ROS:   Please see the history of present illness.     All other systems reviewed and are negative.  EKGs/Labs/Other Studies Reviewed:    The following studies were reviewed today: Echo 02/19/22: IMPRESSIONS     1. Left ventricular ejection fraction, by estimation, is 55 to 60%. The  left ventricle has normal function.  The left ventricle has no regional  wall motion abnormalities. There is mild left ventricular hypertrophy.  Left ventricular diastolic parameters  were normal.   2. Right ventricular systolic function is normal. The right ventricular  size is normal. There is normal pulmonary artery systolic pressure.   3. The mitral valve is normal in structure. Trivial mitral valve  regurgitation. No evidence of mitral stenosis.   4. The aortic valve is normal in structure. Aortic valve regurgitation is  not visualized. No aortic stenosis is present.   5. The inferior vena cava is normal in size with greater than 50%  respiratory variability, suggesting right atrial pressure of 3 mmHg.   Event monitor 01/14/23: Study Highlights  HR 49 - 207 bpm, average 71 bpm 5 nonsustained SVT, longest 11 beats Rare supraventricular and ventricular ectopy. No atrial fibrillation No sustained arrhythmias. Symptom activations correspond to sinus rhythm and PAC's   Donelda Fujita T. Marven Slimmer, MD, [facility] Cardiac Electrophysiology      Recent Labs: 01/15/2023: ALT 12 01/22/2023: BUN 12; Creat 0.85; Hemoglobin 13.7; Platelets 365; Potassium 4.3; Sodium 142  Recent Lipid Panel    Component Value Date/Time   CHOL 147 11/08/2022 1532   TRIG 100 11/08/2022 1532   HDL 47 (L) 11/08/2022 1532   CHOLHDL 3.1 11/08/2022 1532   VLDL 27 08/06/2019 1121   LDLCALC 80 11/08/2022 1532     Risk Assessment/Calculations:                Physical Exam:    VS:  BP 118/74   Pulse 66   Ht 5\' 5"  (1.651 m)   Wt (!) 322 lb (146.1 kg)   SpO2 98%   BMI 53.58 kg/m     Wt Readings from Last 3 Encounters:  02/18/23 (!) 322 lb (146.1 kg)  02/05/23 (!) 315 lb 3.2 oz (143 kg)  02/01/23 (!) 313 lb 12.8 oz (142.3 kg)     GEN:  Well nourished, obese in no acute distress HEENT: Normal NECK: No JVD; No carotid bruits LYMPHATICS: No lymphadenopathy CARDIAC: RRR, no murmurs, rubs, gallops RESPIRATORY:  Clear to auscultation without  rales,  wheezing or rhonchi  ABDOMEN: Soft, non-tender, non-distended MUSCULOSKELETAL:  No edema; No deformity  SKIN: Warm and dry NEUROLOGIC:  Alert and oriented x 3 PSYCHIATRIC:  Normal affect   ASSESSMENT:    1. Palpitations   2. Chest pain, unspecified type    PLAN:    In order of problems listed above:  Palpitations. Reviewed results of event monitor with her. Rare brief run of SVT longest 11 beats. Echo was normal. I feel these findings are benign. Would encourage regular aerobic exercise. Avoid caffeine. I don't think her current level of symptoms require treatment and I would avoid beta blocker due to asthma. If she were to have worsening symptoms could consider diltiazem but overall she is low risk and I have reassured her Atypical chest pain. Symptoms are not c/w cardiac cause.            Medication Adjustments/Labs and Tests Ordered: Current medicines are reviewed at length with the patient today.  Concerns regarding medicines are outlined above.  No orders of the defined types were placed in this encounter.  No orders of the defined types were placed in this encounter.   Patient Instructions  Medication Instructions:  Continue same medications *If you need a refill on your cardiac medications before your next appointment, please call your pharmacy*   Lab Work: None ordered   Testing/Procedures: None ordered   Follow-Up: At [facility], you and your health needs are our priority.  As part of our continuing mission to provide you with exceptional heart care, we have created designated Provider Care Teams.  These Care Teams include your primary Cardiologist (physician) and Advanced Practice Providers (APPs -  Physician Assistants and Nurse Practitioners) who all work together to provide you with the care you need, when you need it.  We recommend signing up for the patient portal called "MyChart".  Sign up information is provided on this After Visit  Summary.  MyChart is used to connect with patients for Virtual Visits (Telemedicine).  Patients are able to view lab/test results, encounter notes, upcoming appointments, etc.  Non-urgent messages can be sent to your provider as well.   To learn more about what you can do with MyChart, go to [email or web address].    Your next appointment:  As Needed    Provider:  Dr.Tocarra Gassen        Signed, Elon Lomeli [place], MD  02/18/2023 2:34 PM    [facility]

## 2023-02-18 ENCOUNTER — Encounter: Payer: Self-pay | Admitting: Cardiology

## 2023-02-18 ENCOUNTER — Ambulatory Visit: Payer: 59 | Attending: Cardiology | Admitting: Cardiology

## 2023-02-18 ENCOUNTER — Ambulatory Visit: Payer: 59 | Admitting: Internal Medicine

## 2023-02-18 VITALS — BP 118/74 | HR 66 | Ht 65.0 in | Wt 322.0 lb

## 2023-02-18 DIAGNOSIS — R002 Palpitations: Secondary | ICD-10-CM | POA: Diagnosis not present

## 2023-02-18 DIAGNOSIS — R079 Chest pain, unspecified: Secondary | ICD-10-CM | POA: Diagnosis not present

## 2023-02-18 NOTE — Patient Instructions (Signed)
 Medication Instructions:  Continue same medications *If you need a refill on your cardiac medications before your next appointment, please call your pharmacy*   Lab Work: None ordered   Testing/Procedures: None ordered   Follow-Up: At [facility], you and your health needs are our priority.  As part of our continuing mission to provide you with exceptional heart care, we have created designated Provider Care Teams.  These Care Teams include your primary Cardiologist (physician) and Advanced Practice Providers (APPs -  Physician Assistants and Nurse Practitioners) who all work together to provide you with the care you need, when you need it.  We recommend signing up for the patient portal called "MyChart".  Sign up information is provided on this After Visit Summary.  MyChart is used to connect with patients for Virtual Visits (Telemedicine).  Patients are able to view lab/test results, encounter notes, upcoming appointments, etc.  Non-urgent messages can be sent to your provider as well.   To learn more about what you can do with MyChart, go to [email or web address].    Your next appointment:  As Needed    Provider:  Dr.Jordan

## 2023-02-19 ENCOUNTER — Ambulatory Visit (INDEPENDENT_AMBULATORY_CARE_PROVIDER_SITE_OTHER): Payer: 59 | Admitting: Professional Counselor

## 2023-02-19 ENCOUNTER — Telehealth: Payer: Self-pay | Admitting: Family Medicine

## 2023-02-19 DIAGNOSIS — F603 Borderline personality disorder: Secondary | ICD-10-CM

## 2023-02-19 NOTE — Telephone Encounter (Signed)
Angel with [employer] is calling in because pt was having issues with her insurance so her Prior Authorization for [employer] and [employer] weren't processed because the insurance was showing inactive. Lawanna Kobus says the insurance is now active and the insurance needs the PA's resent. Please advise.

## 2023-02-19 NOTE — Progress Notes (Signed)
   THERAPIST PROGRESS NOTE  Virtual Visit via Video Note  I connected with Barbara Pennington on 02/19/23 at  8:00 AM EST by a video enabled telemedicine application and verified that I am speaking with the correct person using two identifiers.  Location: Patient: Work Provider: Office   I discussed the limitations of evaluation and management by telemedicine and the availability of in person appointments. The patient expressed understanding and agreed to proceed.  I discussed the assessment and treatment plan with the patient. The patient was provided an opportunity to ask questions and all were answered. The patient agreed with the plan and demonstrated an understanding of the instructions.   The patient was advised to call back or seek an in-person evaluation if the symptoms worsen or if the condition fails to improve as anticipated.  I provided 27 minutes of non-face-to-face time during this encounter. Barbara Pennington, [facility]  Session Time: 8:00 AM - 8:27 AM   Participation Level: Active  Behavioral Response: Well Groomed, Alert, Dysphoric  Type of Therapy: Individual Therapy  Treatment Goals addressed: Active Anxiety  LTG: "I guess working through all the issues from the past. Learning how to better cope with stress."                Start:  02/05/23   End:  02/04/24   STG: "I don't deal with stress well. I get overwhelmed. I shut down. I get angry. I can't communicate my thoughts." To improve knowledge of DBT skills to reduce sxs per self report over the next 12 weeks.    ProgressTowards Goals: Progressing  Interventions: CBT  Summary: Barbara Pennington is a 43 y.o. female who presents with a history of anxiety, depression, PTSD, and BPD. She appeared alert and oriented x5. She reported she went back to work and had [employer]. She is feeling overwhelmed with things. Annalaya actively listened to psychoeducation. She engaged in guided meditations and noted they were hard. She was  receptive to practicing between now and next session.   Therapist Response: Conducted telehealth session with Barbara Pennington. Began session with check-in/update since previous session. Utilized empathetic and reflective listening. Provided psychoeducation on mindfulness and goals of module. Explained WISE MIND concepts and engaged Lashaundra in two practice meditations - [place] and spiral staircase. Normalized struggle to quiet and control the mind and encouraged consistent practice. Confirmed next session, scheduled additional session, and concluded session.   Suicidal/Homicidal: No  Plan: Return again in 2 weeks.  Diagnosis: Borderline personality disorder (HCC)  Collaboration of Care: Medication Management AEB chart review  Patient/Guardian was advised Release of Information must be obtained prior to any record release in order to collaborate their care with an outside provider. Patient/Guardian was advised if they have not already done so to contact the registration department to sign all necessary forms in order for [name] to release information regarding their care.   Consent: Patient/Guardian gives verbal consent for treatment and assignment of benefits for services provided during this visit. Patient/Guardian expressed understanding and agreed to proceed.   Barbara Pennington, [facility] 02/19/2023

## 2023-02-19 NOTE — Telephone Encounter (Signed)
PA's for patient medications does not allow for me to re do them since they were denied. At the time I did the PA's it did not alert me like it usually does when an insurance is inactive. It went through successfully and insurance is not covering patient medications due to other reason.  I called pt to inform her she did not answer and left vm to call us back.

## 2023-02-21 ENCOUNTER — Other Ambulatory Visit: Payer: Self-pay | Admitting: Family Medicine

## 2023-02-21 ENCOUNTER — Encounter: Payer: Self-pay | Admitting: Family Medicine

## 2023-02-21 DIAGNOSIS — G4733 Obstructive sleep apnea (adult) (pediatric): Secondary | ICD-10-CM

## 2023-02-21 MED ORDER — MODAFINIL 100 MG PO TABS: 100.0000 mg | ORAL_TABLET | Freq: Every day | ORAL | 0 refills | Status: DC

## 2023-02-21 MED ORDER — SEMAGLUTIDE-WEIGHT MANAGEMENT 1 MG/0.5ML ~~LOC~~ SOAJ: 1.0000 mg | SUBCUTANEOUS | 0 refills | Status: DC

## 2023-02-21 MED ORDER — SEMAGLUTIDE-WEIGHT MANAGEMENT 0.25 MG/0.5ML ~~LOC~~ SOAJ: 0.2500 mg | SUBCUTANEOUS | 0 refills | Status: AC

## 2023-02-21 MED ORDER — SEMAGLUTIDE-WEIGHT MANAGEMENT 0.5 MG/0.5ML ~~LOC~~ SOAJ: 0.5000 mg | SUBCUTANEOUS | 0 refills | Status: AC

## 2023-02-25 NOTE — Telephone Encounter (Signed)
I will work on PA

## 2023-02-26 ENCOUNTER — Other Ambulatory Visit: Payer: Self-pay | Admitting: Orthopedic Surgery

## 2023-02-26 ENCOUNTER — Other Ambulatory Visit: Payer: Self-pay | Admitting: Family Medicine

## 2023-02-26 ENCOUNTER — Encounter: Payer: Self-pay | Admitting: Family Medicine

## 2023-02-26 DIAGNOSIS — G8929 Other chronic pain: Secondary | ICD-10-CM

## 2023-03-04 ENCOUNTER — Ambulatory Visit (INDEPENDENT_AMBULATORY_CARE_PROVIDER_SITE_OTHER): Payer: 59 | Admitting: Family Medicine

## 2023-03-04 VITALS — BP 122/80 | HR 94 | Resp 18 | Ht 65.0 in | Wt 319.8 lb

## 2023-03-04 DIAGNOSIS — I471 Supraventricular tachycardia, unspecified: Secondary | ICD-10-CM | POA: Diagnosis not present

## 2023-03-04 DIAGNOSIS — I10 Essential (primary) hypertension: Secondary | ICD-10-CM | POA: Diagnosis not present

## 2023-03-04 DIAGNOSIS — J4530 Mild persistent asthma, uncomplicated: Secondary | ICD-10-CM

## 2023-03-04 DIAGNOSIS — Z6841 Body Mass Index (BMI) 40.0 and over, adult: Secondary | ICD-10-CM

## 2023-03-04 DIAGNOSIS — G4733 Obstructive sleep apnea (adult) (pediatric): Secondary | ICD-10-CM

## 2023-03-04 MED ORDER — AMLODIPINE BESYLATE 2.5 MG PO TABS: 2.5000 mg | ORAL_TABLET | Freq: Every day | ORAL | 0 refills | Status: DC

## 2023-03-04 NOTE — Progress Notes (Signed)
 Name: Barbara Pennington   MRN: [id number]    DOB: 22-Oct-1980   Date:03/04/2023       Progress Note  Subjective  Chief Complaint  Chief Complaint  Patient presents with   Medical Management of Chronic Issues   Discussed the use of AI scribe software for clinical note transcription with the patient, who gave verbal consent to proceed.  History of Present Illness   Barbara Pennington is a 43 year old female with intermittent asthma and recent pneumonia who presents for follow-up on respiratory issues and elevated CRP   She has experienced improvement in her breathing since her last visit a month ago, although she continues to have some shortness of breath with activity. She uses Symbicort twice daily as maintenance therapy and Airsupra as needed, which she has used only on one or two occasions for exertional symptoms such as walking and household chores. A pulmonary function test showed an FEV1 to FVC ratio of 80%. She was diagnosed with pneumonia in January, following a period of persistent cough since December, and experienced hypoxemia at that time. Currently under the care of pulmonologist  Regarding her cardiac health, she underwent an echocardiogram on February 12, which showed good pumping function with mild left ventricular hypertrophy. An event monitor revealed brief runs of supraventricular tachycardia, but her cardiologist concluded that her shortness of breath is more related to her lung condition than her heart and released her from his care  Her recent lab work showed a high C-reactive protein level a month ago, during the peak of her illness, but her white blood cell count has normalized, she prefers holding off on repeating labs today.  She was previously malnourished with low albumin levels and is currently taking B12 and folic acid supplements to address deficiencies.  In terms of weight management, she has been approved to restart Wegovy at the 0.25 mg dose, but financial constraints  have delayed her ability to purchase the medication.  She has been prescribed diclofenac for knee pain following a fall, but has not yet obtained the medication. She was seen by Ortho  She uses a CPAP machine for sleep apnea, which is not currently monitored by her pulmonologist.   She has been taking valsartan hctz and Amlodipine 2.5 mg for bp control, denies side effects and needs refills today       Patient Active Problem List   Diagnosis Date Noted   High risk medication use 12/17/2022   Chronic cough 02/13/2022   Skin-picking disorder 12/19/2021   Adjustment disorder with depressed mood 06/13/2021   Closed nondisplaced longitudinal fracture of right patella 06/09/2021   GAD (generalized anxiety disorder) 05/05/2021   Depressive disorder 05/05/2021   History of borderline personality disorder 05/05/2021   History of alcohol abuse 05/05/2021   Internal derangement of left knee 04/03/2021   Primary osteoarthritis of left knee 04/03/2021   Metabolic syndrome 03/24/2021   Impaired fasting glucose 12/06/2020   Lymphedema 12/06/2020   Moderate episode of recurrent major depressive disorder (HCC) 02/08/2020   CKD (chronic kidney disease) stage 3, GFR 30-59 ml/min (HCC) 06/11/2019   PTSD (post-traumatic stress disorder) 01/14/2019   Borderline personality disorder (HCC) 01/14/2019   Anxiety 01/14/2019   Mixed hyperlipidemia 01/14/2019   Morbid obesity with BMI of 50.0-59.9, adult (HCC) 01/14/2019   OSA on CPAP 01/14/2019   Asthma, mild persistent 04/30/2018   Low back pain 08/20/2017   Fibroadenoma of breast, 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   Essential hypertension     Past  Surgical History:  Procedure Laterality Date   ABDOMINAL HYSTERECTOMY  01/08/2006   BREAST BIOPSY Left 01/09/2011   CORE - FIBROADENOMA VS PHYLLODES   CESAREAN SECTION  01/08/2005   COLONOSCOPY WITH PROPOFOL N/A 10/09/2016   Procedure: COLONOSCOPY WITH PROPOFOL;  Surgeon: Wyline Mood, MD;  Location: [facility] ENDOSCOPY;   Service: Gastroenterology;  Laterality: N/A;   COLONOSCOPY WITH PROPOFOL N/A 11/09/2019   Procedure: COLONOSCOPY WITH PROPOFOL;  Surgeon: Toney Reil, MD;  Location: [facility] ENDOSCOPY;  Service: Gastroenterology;  Laterality: N/A;   ESOPHAGOGASTRODUODENOSCOPY     GUM SURGERY  01/08/2002   KNEE ARTHROSCOPY WITH MEDIAL MENISECTOMY Right 07/24/2021   Procedure: Right medial meniscus root repair and patella chondroplasty;  Surgeon: Signa Kell, MD;  Location: [facility] ORS;  Service: Orthopedics;  Laterality: Right;   PLANTAR FASCIA RELEASE Left 08/19/2014   Procedure: ENDOSCOPIC PLANTAR FASCIOTOMY RELEASE L WITH TOPAZ;  Surgeon: Recardo Evangelist, DPM;  Location: [facility];  Service: Podiatry;  Laterality: Left;  LMA WITH POPLITEAL BLOCK   TONSILLECTOMY  01/08/1982    Family History  Problem Relation Age of Onset   Obesity Mother    Diabetes Mother    High Cholesterol Mother    Hypertension Mother    Neuropathy Mother    Depression Mother    Anxiety disorder Mother    Asthma Mother    Obesity Father    Hypertension Father    High Cholesterol Father    Depression Father    Anxiety disorder Father    Seizures Brother    Testicular cancer Brother    Breast cancer Maternal Grandmother    Hypertension Maternal Grandmother    Aneurysm Maternal Grandmother    Stroke Maternal Grandmother    Hypertension Maternal Grandfather    High Cholesterol Maternal Grandfather    Heart disease Maternal Grandfather        has a pacemaker   Stroke Maternal Grandfather    Alzheimer's disease Paternal Grandmother    Dementia Paternal Grandmother    Colon cancer Paternal Grandfather    Mental illness Maternal Aunt    Suicidality Cousin     Social History   Tobacco Use   Smoking status: Former    Current packs/day: 0.00    Average packs/day: 0.3 packs/day for 10.0 years (2.5 ttl pk-yrs)    Types: Cigarettes    Start date: 07/04/2004    Quit date: 07/05/2014    Years since quitting: 8.6    Smokeless tobacco: Never  Substance Use Topics   Alcohol use: Yes    Comment: rare     Current Outpatient Medications:    acetaminophen (TYLENOL) 500 MG tablet, Take 1,000 mg by mouth every 6 (six) hours as needed for moderate pain (pain score 4-6)., Disp: , Rfl:    Albuterol-Budesonide (AIRSUPRA) 90-80 MCG/ACT AERO, Inhale 2 puffs into the lungs 4 (four) times daily as needed., Disp: 10.7 g, Rfl: 1   atorvastatin (LIPITOR) 20 MG tablet, Take 1 tablet (20 mg total) by mouth every morning., Disp: 90 tablet, Rfl: 1   benzonatate (TESSALON) 100 MG capsule, Take 1-2 capsules (100-200 mg total) by mouth 3 (three) times daily as needed., Disp: 60 capsule, Rfl: 0   budesonide-formoterol (SYMBICORT) 160-4.5 MCG/ACT inhaler, Inhale 2 puffs into the lungs in the morning and at bedtime., Disp: 1 each, Rfl: 12   buPROPion (WELLBUTRIN XL) 150 MG 24 hr tablet, TAKE 1 TABLET (150 MG TOTAL) BY MOUTH DAILY WITH BREAKFAST. STOP WELLBUTRIN XL 300 MG, Disp: 90 tablet, Rfl:  0   busPIRone (BUSPAR) 30 MG tablet, Take 1 tablet (30 mg total) by mouth 2 (two) times daily., Disp: 180 tablet, Rfl: 1   chlorpheniramine-HYDROcodone (TUSSIONEX) 10-8 MG/5ML, Take 5 mLs by mouth at bedtime as needed for cough., Disp: 70 mL, Rfl: 0   desloratadine (CLARINEX) 5 MG tablet, Take 1 tablet (5 mg total) by mouth daily., Disp: 90 tablet, Rfl: 1   escitalopram (LEXAPRO) 20 MG tablet, Take 1 tablet (20 mg total) by mouth daily., Disp: 90 tablet, Rfl: 1   fluticasone (FLONASE) 50 MCG/ACT nasal spray, Place 1 spray into both nostrils daily., Disp: 48 mL, Rfl: 1   Melatonin 10 MG CAPS, Take 10-20 mg by mouth at bedtime., Disp: , Rfl:    meloxicam (MOBIC) 15 MG tablet, Take 15 mg by mouth daily., Disp: , Rfl:    modafinil (PROVIGIL) 100 MG tablet, Take 1 tablet (100 mg total) by mouth daily., Disp: 30 tablet, Rfl: 0   montelukast (SINGULAIR) 10 MG tablet, Take 1 tablet (10 mg total) by mouth at bedtime., Disp: 90 tablet, Rfl: 1    valsartan-hydrochlorothiazide (DIOVAN-HCT) 160-12.5 MG tablet, TAKE 1 TABLET BY MOUTH EVERY DAY, Disp: 90 tablet, Rfl: 0   clonazePAM (KLONOPIN) 0.5 MG tablet, Take 1 tablet (0.5 mg total) by mouth daily as needed for anxiety. Please limit use (Patient not taking: Reported on 03/04/2023), Disp: 15 tablet, Rfl: 0   gabapentin (NEURONTIN) 300 MG capsule, Take 1 capsule by mouth 3 (three) times daily., Disp: , Rfl:    Semaglutide-Weight Management 0.25 MG/0.5ML SOAJ, Inject 0.25 mg into the skin once a week for 28 days. (Patient not taking: Reported on 03/04/2023), Disp: 2 mL, Rfl: 0   [START ON 03/22/2023] Semaglutide-Weight Management 0.5 MG/0.5ML SOAJ, Inject 0.5 mg into the skin once a week for 28 days. (Patient not taking: Reported on 03/04/2023), Disp: 2 mL, Rfl: 0   [START ON 04/20/2023] Semaglutide-Weight Management 1 MG/0.5ML SOAJ, Inject 1 mg into the skin once a week for 28 days. (Patient not taking: Reported on 03/04/2023), Disp: 2 mL, Rfl: 0  Allergies  Allergen Reactions   Latex Rash   Metformin And Related     diarrhea    I personally reviewed active problem list, medication list, allergies, family history with the patient/caregiver today.   ROS  Ten systems reviewed and is negative except as mentioned in HPI    Objective  Vitals:   03/04/23 1532  BP: 122/80  Pulse: 94  Resp: 18  SpO2: 95%  Weight: (!) 319 lb 12.8 oz (145.1 kg)  Height: 5\' 5"  (1.651 m)    Body mass index is 53.22 kg/m.  Physical Exam  Constitutional: Patient appears well-developed and well-nourished. Obese  No distress.  HEENT: head atraumatic, normocephalic, pupils equal and reactive to light, neck supple Cardiovascular: Normal rate, regular rhythm and normal heart sounds.  No murmur heard. No BLE edema. Pulmonary/Chest: Effort normal and breath sounds normal. No respiratory distress. Abdominal: Soft.  There is no tenderness. Psychiatric: Patient has a normal mood and affect. behavior is normal.  Judgment and thought content normal.   Recent Results (from the past 2160 hours)  CBG monitoring, ED     Status: Abnormal   Collection Time: 12/12/22  8:28 AM  Result Value Ref Range   Glucose-Capillary 132 (H) 70 - 99 mg/dL    Comment: Glucose reference range applies only to samples taken after fasting for at least 8 hours.  Basic metabolic panel     Status:  Abnormal   Collection Time: 12/12/22  8:31 AM  Result Value Ref Range   Sodium 139 135 - 145 mmol/L   Potassium 3.4 (L) 3.5 - 5.1 mmol/L   Chloride 105 98 - 111 mmol/L   CO2 25 22 - 32 mmol/L   Glucose, Bld 131 (H) 70 - 99 mg/dL    Comment: Glucose reference range applies only to samples taken after fasting for at least 8 hours.   BUN 12 6 - 20 mg/dL   Creatinine, Ser 1.61 (H) 0.44 - 1.00 mg/dL   Calcium 9.0 8.9 - 09.6 mg/dL   GFR, Estimated >04 >54 mL/min    Comment: (NOTE) Calculated using the CKD-EPI Creatinine Equation (2021)    Anion gap 9 5 - 15    Comment: Performed at [facility], [address], [place], [place] [address]  CBC     Status: None   Collection Time: 12/12/22  8:31 AM  Result Value Ref Range   WBC 10.0 4.0 - 10.5 K/uL   RBC 4.71 3.87 - 5.11 MIL/uL   Hemoglobin 14.6 12.0 - 15.0 g/dL   HCT 91.4 78.2 - 95.6 %   MCV 94.1 80.0 - 100.0 fL   MCH 31.0 26.0 - 34.0 pg   MCHC 33.0 30.0 - 36.0 g/dL   RDW 21.3 08.6 - 57.8 %   Platelets 277 150 - 400 K/uL   nRBC 0.0 0.0 - 0.2 %    Comment: Performed at [facility], [address], [place], [place] [address]  Troponin I (High Sensitivity)     Status: None   Collection Time: 12/12/22  8:31 AM  Result Value Ref Range   Troponin I (High Sensitivity) 3 <18 ng/L    Comment: (NOTE) Elevated high sensitivity troponin I (hsTnI) values and significant  changes across serial measurements may suggest ACS but many other  chronic and acute conditions are known to elevate hsTnI results.  Refer to the "Links" section for chest pain algorithms and additional   guidance. Performed at [facility], [address] [place], [place] [address]   Urinalysis, Routine w reflex microscopic -Urine, Clean Catch     Status: None   Collection Time: 12/12/22  9:42 AM  Result Value Ref Range   Color, Urine YELLOW YELLOW   APPearance CLEAR CLEAR   Specific Gravity, Urine 1.025 1.005 - 1.030   pH 5.0 5.0 - 8.0   Glucose, UA NEGATIVE NEGATIVE mg/dL   Hgb urine dipstick NEGATIVE NEGATIVE   Bilirubin Urine NEGATIVE NEGATIVE   Ketones, ur NEGATIVE NEGATIVE mg/dL   Protein, ur NEGATIVE NEGATIVE mg/dL   Nitrite NEGATIVE NEGATIVE   Leukocytes,Ua NEGATIVE NEGATIVE    Comment: Performed at [facility], [address] [place], [place] [address]  Urine drugs of abuse scrn w alc, routine (Ref Lab)     Status: None   Collection Time: 12/18/22  5:33 PM  Result Value Ref Range   Amphetamines, Urine Negative Cutoff=1000 ng/mL    Comment: Amphetamine test includes Amphetamine and Methamphetamine.   Barbiturate, Ur Negative Cutoff=300 ng/mL   Benzodiazepine Quant, Ur Negative Cutoff=300 ng/mL   Cannabinoid Quant, Ur Negative Cutoff=50 ng/mL   Cocaine (Metab.) Negative Cutoff=300 ng/mL   Opiate Quant, Ur Negative Cutoff=300 ng/mL    Comment: Opiate test includes Codeine and Morphine only.   Phencyclidine, Ur Negative Cutoff=25 ng/mL   Methadone Screen, Urine Negative Cutoff=300 ng/mL   Propoxyphene, Urine Negative Cutoff=300 ng/mL   Ethanol  Caryn Bee Negative Cutoff=0.020 %    Comment: (NOTE) Performed At: [facility] [address] [place], [place] [id number] Avis Epley PhD [id number]   Basic metabolic panel     Status: Abnormal   Collection Time: 01/10/23 11:43 AM  Result Value Ref Range   Sodium 134 (L) 135 - 145 mmol/L   Potassium 3.5 3.5 - 5.1 mmol/L   Chloride 104 98 - 111 mmol/L   CO2 19 (L) 22 - 32 mmol/L   Glucose, Bld 100 (H) 70 - 99 mg/dL    Comment: Glucose reference range applies only to samples taken after fasting for at  least 8 hours.   BUN 7 6 - 20 mg/dL   Creatinine, Ser 1.30 0.44 - 1.00 mg/dL   Calcium 8.2 (L) 8.9 - 10.3 mg/dL   GFR, Estimated >86 >57 mL/min    Comment: (NOTE) Calculated using the CKD-EPI Creatinine Equation (2021)    Anion gap 11 5 - 15    Comment: Performed at [facility], [address] [place], [place] [address]  CBC     Status: Abnormal   Collection Time: 01/10/23 11:43 AM  Result Value Ref Range   WBC 12.5 (H) 4.0 - 10.5 K/uL   RBC 4.25 3.87 - 5.11 MIL/uL   Hemoglobin 13.2 12.0 - 15.0 g/dL   HCT 29.5 28.4 - 13.2 %   MCV 96.2 80.0 - 100.0 fL   MCH 31.1 26.0 - 34.0 pg   MCHC 32.3 30.0 - 36.0 g/dL   RDW 44.0 10.2 - 72.5 %   Platelets 214 150 - 400 K/uL   nRBC 0.0 0.0 - 0.2 %    Comment: Performed at [facility], [address], [place], [place] [address]  Troponin I (High Sensitivity)     Status: None   Collection Time: 01/10/23 11:43 AM  Result Value Ref Range   Troponin I (High Sensitivity) 3 <18 ng/L    Comment: (NOTE) Elevated high sensitivity troponin I (hsTnI) values and significant  changes across serial measurements may suggest ACS but many other  chronic and acute conditions are known to elevate hsTnI results.  Refer to the "Links" section for chest pain algorithms and additional  guidance. Performed at [facility], [address] [place], [place] [address]   COMPLETE METABOLIC PANEL WITH GFR     Status: Abnormal   Collection Time: 01/15/23 11:13 AM  Result Value Ref Range   Glucose, Bld 83 65 - 99 mg/dL    Comment: .            Fasting reference interval .    BUN 10 7 - 25 mg/dL   Creat 2.59 5.63 - 8.75 mg/dL   eGFR 79 > OR = 60 IE/PPI/9.51O8   BUN/Creatinine Ratio SEE NOTE: 6 - 22 (calc)    Comment:    Not Reported: BUN and Creatinine are within    reference range. .    Sodium 142 135 - 146 mmol/L   Potassium 5.0 3.5 - 5.3 mmol/L   Chloride 107 98 - 110 mmol/L   CO2 29 20 - 32 mmol/L   Calcium 8.9 8.6 - 10.2  mg/dL   Total Protein 5.5 (L) 6.1 - 8.1 g/dL   Albumin 3.5 (L) 3.6 - 5.1 g/dL   Globulin 2.0 1.9 - 3.7 g/dL (calc)   AG Ratio 1.8 1.0 - 2.5 (calc)   Total Bilirubin 0.2 0.2 - 1.2 mg/dL   Alkaline phosphatase (APISO) 52 31 -  125 U/L   AST 6 (L) 10 - 30 U/L   ALT 12 6 - 29 U/L  BASIC METABOLIC PANEL WITH GFR     Status: None   Collection Time: 01/22/23  8:49 AM  Result Value Ref Range   Glucose, Bld 87 65 - 99 mg/dL    Comment: .            Fasting reference interval .    BUN 12 7 - 25 mg/dL   Creat 9.52 8.41 - 3.24 mg/dL   eGFR 87 > OR = 60 MW/NUU/7.25D6   BUN/Creatinine Ratio SEE NOTE: 6 - 22 (calc)    Comment:    Not Reported: BUN and Creatinine are within    reference range. .    Sodium 142 135 - 146 mmol/L   Potassium 4.3 3.5 - 5.3 mmol/L   Chloride 106 98 - 110 mmol/L   CO2 28 20 - 32 mmol/L   Calcium 9.7 8.6 - 10.2 mg/dL  C-reactive protein     Status: Abnormal   Collection Time: 01/22/23  8:49 AM  Result Value Ref Range   CRP 18.4 (H) <8.0 mg/L  CBC with Differential/Platelet     Status: Abnormal   Collection Time: 01/22/23  8:49 AM  Result Value Ref Range   WBC 8.3 3.8 - 10.8 Thousand/uL   RBC 4.46 3.80 - 5.10 Million/uL   Hemoglobin 13.7 11.7 - 15.5 g/dL   HCT 64.4 03.4 - 74.2 %   MCV 91.9 80.0 - 100.0 fL   MCH 30.7 27.0 - 33.0 pg   MCHC 33.4 32.0 - 36.0 g/dL    Comment: For adults, a slight decrease in the calculated MCHC value (in the range of 30 to 32 g/dL) is most likely not clinically significant; however, it should be interpreted with caution in correlation with other red cell parameters and the patient's clinical condition.    RDW 12.5 11.0 - 15.0 %   Platelets 365 140 - 400 Thousand/uL   MPV 9.8 7.5 - 12.5 fL   Neutro Abs 4,125 1,500 - 7,800 cells/uL   Absolute Lymphocytes 2,822 850 - 3,900 cells/uL   Absolute Monocytes 581 200 - 950 cells/uL   Eosinophils Absolute 681 (H) 15 - 500 cells/uL   Basophils Absolute 91 0 - 200 cells/uL    Neutrophils Relative % 49.7 %   Total Lymphocyte 34.0 %   Monocytes Relative 7.0 %   Eosinophils Relative 8.2 %   Basophils Relative 1.1 %  Nitric oxide     Status: None   Collection Time: 02/05/23  4:15 PM  Result Value Ref Range   Nitric Oxide 36     Diabetic Foot Exam:     PHQ2/9:    01/24/2023    8:29 AM 01/15/2023   10:25 AM 01/10/2023    8:05 AM 12/17/2022    1:10 PM 11/08/2022    2:57 PM  Depression screen PHQ 2/9  Decreased Interest 0 0  0 0  Down, Depressed, Hopeless 0 0  0 0  PHQ - 2 Score 0 0  0 0  Altered sleeping 0 0  0 0  Tired, decreased energy 0 0  0 0  Change in appetite 0 0  0 0  Feeling bad or failure about yourself  0 0  0 0  Trouble concentrating 0 0  0 0  Moving slowly or fidgety/restless 0 0  0 0  Suicidal thoughts 0 0  0 0  PHQ-9 Score 0  0  0 0  Difficult doing work/chores Not difficult at all Not difficult at all   Not difficult at all     Information is confidential and restricted. Go to Review Flowsheets to unlock data.    phq 9 is negative  Fall Risk:    01/24/2023    8:28 AM 01/15/2023   10:25 AM 12/17/2022    1:09 PM 11/08/2022    2:57 PM 10/30/2022    3:30 PM  Fall Risk   Falls in the past year? 1 1 1  0 1  Number falls in past yr: 0 0 0 0 0  Injury with Fall? 1 0 0 0 0  Risk for fall due to : Impaired balance/gait Impaired balance/gait History of fall(s)  No Fall Risks  Follow up Falls prevention discussed;Falls evaluation completed;Education provided Education provided;Falls evaluation completed;Falls prevention discussed Falls prevention discussed;Education provided;Falls evaluation completed  Falls prevention discussed     Assessment and Plan    Asthma Mild persistent Improvement in symptoms with Symbicort and Airsupra. Recent pulmonary function test showed FEV1 to FVC ratio of 60%. Shortness of breath with activity persists. -Continue Symbicort twice daily and Airsupra as needed. -Follow-up with pulmonologist in 3  months.  Hypertension/SVT per holter monitor Patient has been taking Valsartan and plans to resume Amlodipine. -Continue Valsartan-hctz 160/12.5  -Resume Amlodipine 2.5mg  (dose based on previous records). -seen by cardiologist and reassurance given  Elevated C-reactive protein High levels noted during recent illness. White blood cell count has normalized. -Defer rechecking C-reactive protein and other labs until patient is feeling better.  Morbid obesity  ( BMI over 50 ) Patient plans to resume Wegovy in two weeks when funds are available. [facility] 0.25mg  and titrate up as directed -Follow-up in 3 months to assess weight loss progress.  Knee Pain Patient reported knee pain and has a prescription for Diclofenac. -Start Diclofenac as prescribed when funds are available.  Sleep Apnea Patient uses CPAP machine. -Consider discussing CPAP monitoring with pulmonologist at next visit.  Cardiac Health Recent echocardiogram showed mild LVH but otherwise normal. Event monitor showed brief runs of supraventricular tachycardia. No further cardiology follow-up needed. -Continue current management.

## 2023-03-05 ENCOUNTER — Ambulatory Visit (INDEPENDENT_AMBULATORY_CARE_PROVIDER_SITE_OTHER): Payer: 59 | Admitting: Professional Counselor

## 2023-03-05 DIAGNOSIS — F603 Borderline personality disorder: Secondary | ICD-10-CM

## 2023-03-05 NOTE — Progress Notes (Signed)
 THERAPIST PROGRESS NOTE  Virtual Visit via Video Note  I connected with Barbara Pennington on 03/05/23 at  8:00 AM EST by a video enabled telemedicine application and verified that I am speaking with the correct person using two identifiers.  Location: Patient: [employer]) Provider: Home   I discussed the limitations of evaluation and management by telemedicine and the availability of in person appointments. The patient expressed understanding and agreed to proceed.   I discussed the assessment and treatment plan with the patient. The patient was provided an opportunity to ask questions and all were answered. The patient agreed with the plan and demonstrated an understanding of the instructions.   The patient was advised to call back or seek an in-person evaluation if the symptoms worsen or if the condition fails to improve as anticipated.  I provided 43 minutes of non-face-to-face time during this encounter. Edmonia Lynch, [facility]  Session Time: 8:00 AM - 8:43 AM   Participation Level: Active  Behavioral Response: Well Groomed, Alert, Dysphoric  Type of Therapy: Individual Therapy  Treatment Goals addressed: Active Anxiety  LTG: "I guess working through all the issues from the past. Learning how to better cope with stress."                Start:  02/05/23   End:  02/04/24   STG: "I don't deal with stress well. I get overwhelmed. I shut down. I get angry. I can't communicate my thoughts." To improve knowledge of DBT skills to reduce sxs per self report over the next 12 weeks.    ProgressTowards Goals: Progressing  Interventions: CBT and Motivational Interviewing  Summary: Barbara Pennington is a 43 y.o. female who presents with a history of anxiety, depression, PTSD, and BPD. She appeared dysphoric but oriented x5. She stated she is stressed about finances and has been arguing with her partner lately. She also reported reconnecting online with a previous partner who she contributes  a lot of her problems too. She noted it's brought up feelings she wasn't expecting, romanticizing their relationship and questioning her current one. She was receptive to natural vs. Manufactured emotions. Barbara Pennington engaged in writing exercise and identified her patterns of thinking. She was receptive to Socratic questioning and was able to restructure her negative thoughts. She noted this session was very helpful.   Therapist Response: Conducted telehealth session with Barbara Pennington. Began session with check-in/update since previous session. Utilized empathetic and reflective listening. Validated Barbara Pennington feelings and explained natural vs. Manufactured emotions. Engaged in writing exercise, "Cracking the NUTS and eliminating the ANTS." Assisted with identifying thought patterns and used Socratic questioning to challenge negative thinking. Encouraged Barbara Pennington to continue building her awareness on her thinking and maintain a thought log to assist with restructuring thinking. Scheduled additional appointments and concluded session. Emailed copy of cognitive distortions handout to Barbara Pennington.   Suicidal/Homicidal: No  Plan: Return again in 2 weeks.  Diagnosis: Borderline personality disorder (HCC)  Collaboration of Care: Medication Management AEB chart review  Patient/Guardian was advised Release of Information must be obtained prior to any record release in order to collaborate their care with an outside provider. Patient/Guardian was advised if they have not already done so to contact the registration department to sign all necessary forms in order for [name] to release information regarding their care.   Consent: Patient/Guardian gives verbal consent for treatment and assignment of benefits for services provided during this visit. Patient/Guardian expressed understanding and agreed to proceed.   Barbara Pennington  Barbara Pennington, [facility] 03/05/2023

## 2023-03-19 ENCOUNTER — Ambulatory Visit (INDEPENDENT_AMBULATORY_CARE_PROVIDER_SITE_OTHER): Payer: 59 | Admitting: Professional Counselor

## 2023-03-19 DIAGNOSIS — F411 Generalized anxiety disorder: Secondary | ICD-10-CM

## 2023-03-19 NOTE — Progress Notes (Signed)
  THERAPIST PROGRESS NOTE  Virtual Visit via Video Note  I connected with Noami L Featherston on 03/19/23 at  8:00 AM EDT by a video enabled telemedicine application and verified that I am speaking with the correct person using two identifiers.  Location: Patient: Community Provider: Home   I discussed the limitations of evaluation and management by telemedicine and the availability of in person appointments. The patient expressed understanding and agreed to proceed.  I discussed the assessment and treatment plan with the patient. The patient was provided an opportunity to ask questions and all were answered. The patient agreed with the plan and demonstrated an understanding of the instructions.   The patient was advised to call back or seek an in-person evaluation if the symptoms worsen or if the condition fails to improve as anticipated.  I provided 48 minutes of non-face-to-face time during this encounter. Edmonia Lynch, [facility]  Session Time: 8:02 AM - 8:50 AM  Participation Level: Active  Behavioral Response: Well Groomed, Alert, Dysphoric  Type of Therapy: Individual Therapy  Treatment Goals addressed:  Active Anxiety  LTG: "I guess working through all the issues from the past. Learning how to better cope with stress."                Start:  02/05/23   End:  02/04/24   STG: "I don't deal with stress well. I get overwhelmed. I shut down. I get angry. I can't communicate my thoughts." To improve knowledge of DBT skills to reduce sxs per self report over the next 12 weeks.    ProgressTowards Goals: Progressing  Interventions: CBT, Motivational Interviewing, and Supportive  Summary: Autum L Stille is a 43 y.o. female who presents with a history of anxiety, depression, BPD, and PTSD. She stated she's been having "a lot of anxiety at work." She reported they may be relocating to [place] and she doesn't want that commute. She also noted she doesn't like not having control over  things. She engaged in writing exercise and was able to identify things within and outside of her control. She was receptive to feedback from Conservation officer, nature as well. Tariya reported ongoing issues with sleep. She was receptive to sleep hygiene tips and will see if those things help. She expressed some interest in DBT skills group but isn't fully committed at this time.   Therapist Response: Conducted session with Kammy. Began session with check-in/update since previous session. Utilized empathetic and reflective listening. Praised Cal[place]so for practicing thought awareness and highlighted how she changed a statement in session in a positive way. Engaged Addylynn in writing exercise - donut/circles of control. Assisted with identifying things within and outside of her control. Reviewed sleep hygiene tips. Discussed potential to join DBT skills groups and addressed Ailah's concerns with group. Scheduled additional appointment and concluded session.   Suicidal/Homicidal: No  Plan: Return again in 2 weeks.  Diagnosis: GAD (generalized anxiety disorder)  Collaboration of Care: Medication Management AEB chart review  Patient/Guardian was advised Release of Information must be obtained prior to any record release in order to collaborate their care with an outside provider. Patient/Guardian was advised if they have not already done so to contact the registration department to sign all necessary forms in order for [name] to release information regarding their care.   Consent: Patient/Guardian gives verbal consent for treatment and assignment of benefits for services provided during this visit. Patient/Guardian expressed understanding and agreed to proceed.   Edmonia Lynch, [facility] 03/19/2023

## 2023-03-26 ENCOUNTER — Telehealth (INDEPENDENT_AMBULATORY_CARE_PROVIDER_SITE_OTHER): Payer: 59 | Admitting: Psychiatry

## 2023-03-26 ENCOUNTER — Encounter: Payer: Self-pay | Admitting: Psychiatry

## 2023-03-26 DIAGNOSIS — F4310 Post-traumatic stress disorder, unspecified: Secondary | ICD-10-CM | POA: Diagnosis not present

## 2023-03-26 DIAGNOSIS — F424 Excoriation (skin-picking) disorder: Secondary | ICD-10-CM | POA: Diagnosis not present

## 2023-03-26 DIAGNOSIS — F1011 Alcohol abuse, in remission: Secondary | ICD-10-CM

## 2023-03-26 DIAGNOSIS — F411 Generalized anxiety disorder: Secondary | ICD-10-CM

## 2023-03-26 DIAGNOSIS — Z8659 Personal history of other mental and behavioral disorders: Secondary | ICD-10-CM

## 2023-03-26 NOTE — Progress Notes (Unsigned)
 Virtual Visit via Video Note  I connected with Barbara Pennington on 03/26/23 at 11:00 AM EDT by a video enabled telemedicine application and verified that I am speaking with the correct person using two identifiers.  Location Provider Location : [facility] Patient Location : Work  Participants: Patient , Provider    I discussed the limitations of evaluation and management by telemedicine and the availability of in person appointments. The patient expressed understanding and agreed to proceed.   I discussed the assessment and treatment plan with the patient. The patient was provided an opportunity to ask questions and all were answered. The patient agreed with the plan and demonstrated an understanding of the instructions.   The patient was advised to call back or seek an in-person evaluation if the symptoms worsen or if the condition fails to improve as anticipated.   BH MD OP Progress Note  03/27/2023 8:30 AM Barbara Pennington  MRN:  [id number]  Chief Complaint:  Chief Complaint  Patient presents with   Follow-up   Anxiety   Depression   Medication Refill   HPI: Barbara Pennington is a 43 year old Caucasian female, divorced, currently lives in [place], has a history of PTSD, anxiety, skin picking disorder, borderline personality disorder, morbid obesity, OSA on CPAP, hypertension/chronic kidney disease, asthma, chronic back pain, right sided knee injury was evaluated by telemedicine today.  Mood symptoms and anxiety have improved since the last session with her therapist, Miss Alinda Deem. She uses a notebook to take notes during therapy sessions and has requested handouts to reinforce therapy discussions. She applies therapy techniques in daily life and incorporates mindfulness meditation at night, which helps her relax. No panic attacks have occurred since the last visit, and denies suicidal thoughts.  She is interested in joining a DBT therapy group organized by her therapist, although the timeline  for its start is not yet determined. She has regular appointments with her therapist every other week.  She gets approximately six hours of sleep per night, which she feels is of better quality than before, although she would prefer more sleep. She acknowledges that screen time before bed might be affecting her sleep and is considering reading instead. She uses a CPAP machine regularly.  She had a cardiology consultation due to an increased heart rate, but no abnormalities were found, and no follow-up is needed. She continues to experience shortness of breath without exercise and loses her voice halfway through the day, which she attributes to her work that requires a lot of talking. She is on inhaler therapy as prescribed by her pulmonologist due to inflammation in her throat, with a follow-up scheduled for May 18th.  She restarted her regular medication regimen for her mood symptoms  about six weeks ago after discontinuing all medications in January. She is currently taking Lexapro, Buspar, and Wellbutrin. She is aware she needs to give her medications more time and has to take it regularly.    Visit Diagnosis:    ICD-10-CM   1. PTSD (post-traumatic stress disorder)  F43.10     2. GAD (generalized anxiety disorder)  F41.1     3. 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 History of borderline personality disorder  Z86.59     5. History of alcohol abuse  F10.11       Past Psychiatric History: I have reviewed past psychiatric history from progress note on 05/05/2021.  Patient was previously under the care of [facility].  Past Medical History:  Past Medical  History:  Diagnosis Date   Anxiety    Arthritis 2006   rhuematoid - mild - no current issues   Asthma 2003   Borderline personality disorder (HCC)    Chronic kidney disease    stage 3   Complication of anesthesia    "Usually" has low temp after surgery.After hysterectomy temp was 94   Depression    GERD (gastroesophageal reflux disease)     History of kidney stones    Hyperlipidemia    Hypertension 2013   Lump or mass in breast 2013   RIGHT BREAST   Motion sickness    repeated amusement park rides   PTSD (post-traumatic stress disorder)    Scoliosis    no current issues   Shortness of breath dyspnea    secondary to weight    Sleep apnea    uses cpap   Ulcer 2001    Past Surgical History:  Procedure Laterality Date   ABDOMINAL HYSTERECTOMY  01/08/2006   BREAST BIOPSY Left 01/09/2011   CORE - FIBROADENOMA VS PHYLLODES   CESAREAN SECTION  01/08/2005   COLONOSCOPY WITH PROPOFOL N/A 10/09/2016   Procedure: COLONOSCOPY WITH PROPOFOL;  Surgeon: Wyline Mood, MD;  Location: [facility] END[facility]COPY;  Service: Gastroenterology;  Laterality: N/A;   COLONOSCOPY WITH PROPOFOL N/A 11/09/2019   Procedure: COLONOSCOPY WITH PROPOFOL;  Surgeon: Toney Reil, MD;  Location: [facility] ENDOSCOPY;  Service: Gastroenterology;  Laterality: N/A;   ESOPHAGOGASTRODUODENOSCOPY     GUM SURGERY  01/08/2002   KNEE ARTHROSCOPY WITH MEDIAL MENISECTOMY Right 07/24/2021   Procedure: Right medial meniscus root repair and patella chondroplasty;  Surgeon: Signa Kell, MD;  Location: [facility] ORS;  Service: Orthopedics;  Laterality: Right;   PLANTAR FASCIA RELEASE Left 08/19/2014   Procedure: ENDOSCOPIC PLANTAR FASCIOTOMY RELEASE L WITH TOPAZ;  Surgeon: Recardo Evangelist, DPM;  Location[facility];  Service: Podiatry;  Laterality: Left;  LMA WITH POPLITEAL BLOCK   TONSILLECTOMY  01/08/1982    Family Psychiatric History: I have reviewed family psychiatric history from progress note on 05/05/2021.  Family History:  Family History  Problem Relation Age of Onset   Obesity Mother    Diabetes Mother    High Cholesterol Mother    Hypertension Mother    Neuropathy Mother    Depression Mother    Anxiety disorder Mother    Asthma Mother    Obesity Father    Hypertension Father    High Cholesterol Father    Depression Father    Anxiety disorder Father     Seizures Brother    Testicular cancer Brother    Breast cancer Maternal Grandmother    Hypertension Maternal Grandmother    Aneurysm Maternal Grandmother    Stroke Maternal Grandmother    Hypertension Maternal Grandfather    High Cholesterol Maternal Grandfather    Heart disease Maternal Grandfather        has a pacemaker   Stroke Maternal Grandfather    Alzheimer's disease Paternal Grandmother    Dementia Paternal Grandmother    Colon cancer Paternal Grandfather    Mental illness Maternal Aunt    Suicidality Cousin     Social History: I have reviewed social history from progress note on 05/05/2021. Social History   Socioeconomic History   Marital status: Divorced    Spouse name: Not on file   Number of children: 2   Years of education: 14   Highest education level: Bachelor's degree (e.g., BA, AB, BS)  Occupational History   Not on file  Tobacco Use   Smoking status: Former    Current packs/day: 0.00    Average packs/day: 0.3 packs/day for 10.0 years (2.5 ttl pk-yrs)    Types: Cigarettes    Start date: 07/04/2004    Quit date: 07/05/2014    Years since quitting: 8.7   Smokeless tobacco: Never  Vaping Use   Vaping status: Never Used  Substance and Sexual Activity   Alcohol use: Yes    Comment: rare   Drug use: No   Sexual activity: Yes    Partners: Male    Birth control/protection: Surgical    Comment: 2007  Other Topics Concern   Not on file  Social History Narrative   Not on file   Social Drivers of Health   Financial Resource Strain: High Risk (03/04/2023)   Overall Financial Resource Strain (CARDIA)    Difficulty of Paying Living Expenses: Hard  Food Insecurity: Food Insecurity Present (03/04/2023)   Hunger Vital Sign    Worried About Running Out of Food in the Last Year: Often true    Ran Out of Food in the Last Year: Sometimes true  Transportation Needs: No Transportation Needs (03/04/2023)   PRAPARE - Administrator, Civil Service (Medical):  No    Lack of Transportation (Non-Medical): No  Physical Activity: Inactive (03/04/2023)   Exercise Vital Sign    Days of Exercise per Week: 0 days    Minutes of Exercise per Session: 90 min  Stress: Stress Concern Present (03/04/2023)   [employer] of Occupational Health - Occupational Stress Questionnaire    Feeling of Stress : Very much  Social Connections: Moderately Isolated (03/04/2023)   Social Connection and Isolation Panel [NHANES]    Frequency of Communication with Friends and Family: Three times a week    Frequency of Social Gatherings with Friends and Family: Patient declined    Attends Religious Services: Never    Database administrator or Organizations: No    Attends Engineer, structural: Not on file    Marital Status: Living with partner    Allergies:  Allergies  Allergen Reactions   Latex Rash   Metformin And Related     diarrhea    Metabolic Disorder Labs: Lab Results  Component Value Date   HGBA1C 5.2 11/08/2022   MPG 103 11/08/2022   MPG 111 12/15/2021   No results found for: "PROLACTIN" Lab Results  Component Value Date   CHOL 147 11/08/2022   TRIG 100 11/08/2022   HDL 47 (L) 11/08/2022   CHOLHDL 3.1 11/08/2022   VLDL 27 08/06/2019   LDLCALC 80 11/08/2022   LDLCALC 110 (H) 12/15/2021   Lab Results  Component Value Date   TSH 1.83 12/15/2021   TSH 2.59 04/04/2018    Therapeutic Level Labs: No results found for: "LITHIUM" No results found for: "VALPROATE" No results found for: "CBMZ"  Current Medications: Current Outpatient Medications  Medication Sig Dispense Refill   acetaminophen (TYLENOL) 500 MG tablet Take 1,000 mg by mouth every 6 (six) hours as needed for moderate pain (pain score 4-6).     Albuterol-Budesonide (AIRSUPRA) 90-80 MCG/ACT AERO Inhale 2 puffs into the lungs 4 (four) times daily as needed. 10.7 g 1   amLODipine (NORVASC) 2.5 MG tablet Take 1 tablet (2.5 mg total) by mouth daily. 90 tablet 0   atorvastatin  (LIPITOR) 20 MG tablet Take 1 tablet (20 mg total) by mouth every morning. 90 tablet 1   benzonatate (TESSALON) 100 MG capsule  Take 1-2 capsules (100-200 mg total) by mouth 3 (three) times daily as needed. 60 capsule 0   budesonide-formoterol (SYMBICORT) 160-4.5 MCG/ACT inhaler Inhale 2 puffs into the lungs in the morning and at bedtime. 1 each 12   buPROPion (WELLBUTRIN XL) 150 MG 24 hr tablet TAKE 1 TABLET (150 MG TOTAL) BY MOUTH DAILY WITH BREAKFAST. STOP WELLBUTRIN XL 300 MG 90 tablet 0   busPIRone (BUSPAR) 30 MG tablet Take 1 tablet (30 mg total) by mouth 2 (two) times daily. 180 tablet 1   clonazePAM (KLONOPIN) 0.5 MG tablet Take 1 tablet (0.5 mg total) by mouth daily as needed for anxiety. Please limit use (Patient not taking: Reported on 03/04/2023) 15 tablet 0   desloratadine (CLARINEX) 5 MG tablet Take 1 tablet (5 mg total) by mouth daily. 90 tablet 1   diclofenac (VOLTAREN) 75 MG EC tablet Take 75 mg by mouth 2 (two) times daily.     escitalopram (LEXAPRO) 20 MG tablet Take 1 tablet (20 mg total) by mouth daily. 90 tablet 1   fluticasone (FLONASE) 50 MCG/ACT nasal spray Place 1 spray into both nostrils daily. 48 mL 1   gabapentin (NEURONTIN) 300 MG capsule Take 1 capsule by mouth 3 (three) times daily.     Melatonin 10 MG CAPS Take 10-20 mg by mouth at bedtime.     modafinil (PROVIGIL) 100 MG tablet Take 1 tablet (100 mg total) by mouth daily. 30 tablet 0   montelukast (SINGULAIR) 10 MG tablet Take 1 tablet (10 mg total) by mouth at bedtime. 90 tablet 1   Semaglutide-Weight Management 0.5 MG/0.5ML SOAJ Inject 0.5 mg into the skin once a week for 28 days. (Patient not taking: Reported on 03/04/2023) 2 mL 0   [START ON 04/20/2023] Semaglutide-Weight Management 1 MG/0.5ML SOAJ Inject 1 mg into the skin once a week for 28 days. (Patient not taking: Reported on 03/04/2023) 2 mL 0   valsartan-hydrochlorothiazide (DIOVAN-HCT) 160-12.5 MG tablet TAKE 1 TABLET BY MOUTH EVERY DAY 90 tablet 0   No  current facility-administered medications for this visit.     Musculoskeletal: Strength & Muscle Tone:  UTA Gait & Station:  Seated Patient leans: N/A  Psychiatric Specialty Exam: Review of Systems  Psychiatric/Behavioral:  Positive for sleep disturbance. The patient is nervous/anxious.     There were no vitals taken for this visit.There is no height or weight on file to calculate BMI.  General Appearance: Casual  Eye Contact:  Fair  Speech:  Clear and Coherent  Volume:  Normal  Mood:  Anxious improving  Affect:  Appropriate  Thought Process:  Goal Directed and Descriptions of Associations: Intact  Orientation:  Full (Time, Place, and Person)  Thought Content: Logical   Suicidal Thoughts:  No  Homicidal Thoughts:  No  Memory:  Immediate;   Fair Recent;   Fair Remote;   Fair  Judgement:  Fair  Insight:  Fair  Psychomotor Activity:  Normal  Concentration:  Concentration: Fair and Attention Span: Fair  Recall:  [employer] of Knowledge: Fair  Language: Fair  Akathisia:  No  Handed:  Right  AIMS (if indicated): not done  Assets:  Desire for Improvement Housing Social Support Transportation  ADL's:  Intact  Cognition: WNL  Sleep:   improving   Screenings: AIMS    Flowsheet Row Video Visit from 06/13/2021 in [facility] Office Visit from 05/05/2021 in [facility]  AIMS Total Score 0 0  GAD-7    Flowsheet Row Office Visit from 01/24/2023 in [facility] Office Visit from 01/15/2023 in [facility] from 01/10/2023 in [facility] Office Visit from 12/17/2022 in [facility] Office Visit from 09/19/2022 in [facility]  Total GAD-7 Score 0 0 14 0 1      PHQ2-9    Flowsheet Row Office Visit from 01/24/2023 in [facility] Office Visit from 01/15/2023 in [facility] Counselor from 01/10/2023 in [facility] Office Visit from 12/17/2022 in [facility] Office Visit from 11/08/2022 in [facility]  PHQ-2 Total Score 0 0 1 0 0  PHQ-9 Total Score 0 0 7 0 0      Flowsheet Row Video Visit from 03/26/2023 in [facility] Video Visit from 01/24/2023 in [facility] ED from 01/10/2023 in [facility] Emergency Department at [facility]  C-SSRS RISK CATEGORY No Risk No Risk No Risk        Assessment and Plan: Barbara Pennington is a 43 year old Caucasian female who has a history of PTSD, borderline personality disorder, depression was evaluated by telemedicine today.  Discussed assessment and plan as noted below.  Generalized Anxiety Disorder (GAD)-improving Improvement in mood symptoms and anxiety since last session. Mindfulness meditation at night has enhanced sleep quality. No panic attacks since last visit. Actively engaged in therapy and interested in joining a therapy group. Considering medication adjustments post-pulmonology follow-up. - Continue Lexapro 20 mg daily - Continue BuSpar 30 mg twice daily - Continue Wellbutrin 150 mg daily - Continue Clonazepam 0.5 mg as needed for severe anxiety attacks only.  She has been limiting use. - Continue therapy sessions with Miss Ganey - Encourage participation in therapy group - Reassess medication regimen after pulmonology follow-up  Post-Traumatic Stress Disorder (PTSD)-improving Engaged in therapy with no exacerbation of PTSD symptoms. - Continue therapy sessions with Miss Ganey - Continue current medication regimen  Skin picking disorder-improving Currently denies any concerns. - Continue Lexapro and BuSpar as prescribed. - Continue CBT with Ms.  Pricilla Loveless.  Borderline personality disorder-per history Patient continues to be in psychotherapy which is beneficial.  Patient is interested in starting DBT groups in the future. - Continue CBT with Ms. Pricilla Loveless  Alcohol use disorder in remission Sober since 2017. - Will monitor closely.  Follow-up - Follow-up in clinic on May 28 at 4:30 PM in person.  Collaboration of Care: Collaboration of Care: Referral or follow-up with counselor/therapist AEB patient encouraged to continue CBT  Patient/Guardian was advised Release of Information must be obtained prior to any record release in order to collaborate their care with an outside provider. Patient/Guardian was advised if they have not already done so to contact the registration department to sign all necessary forms in order for [name] to release information regarding their care.   Consent: Patient/Guardian gives verbal consent for treatment and assignment of benefits for services provided during this visit. Patient/Guardian expressed understanding and agreed to proceed.  Discussed the use of a AI scribe software for clinical note transcription with the patient, who gave verbal consent to proceed.  This note was generated in part or whole with voice recognition software. Voice recognition is usually quite accurate but there are transcription errors that can and very often do occur. I apologize for any  typographical errors that were not detected and corrected.     Jomarie Longs, MD 03/27/2023, 8:30 AM

## 2023-03-31 IMAGING — US US RENAL
1 series · 14 of 25 positions shown · non-contrast
Comparison: CT renal 04/04/2020

CLINICAL DATA: History of kidney stones.

EXAM:
RENAL / URINARY TRACT ULTRASOUND COMPLETE

[Series 1: us renal · 0.23mm/px · 14 of 43 slices shown]
[im 1/43]
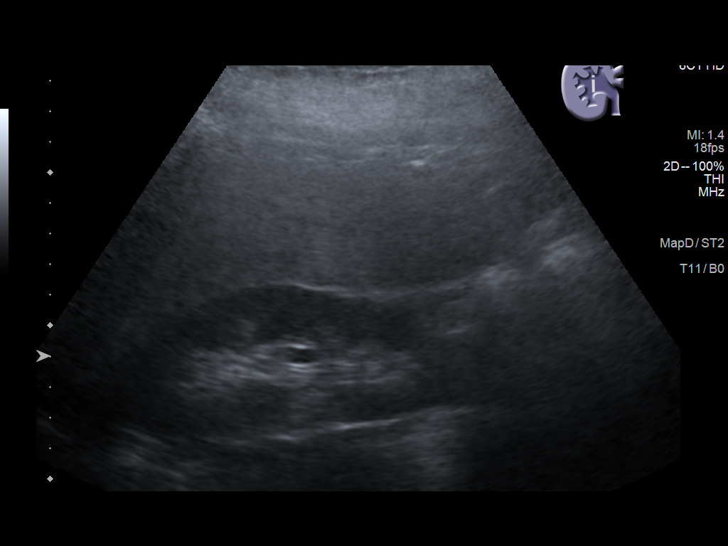
[im 4/43]
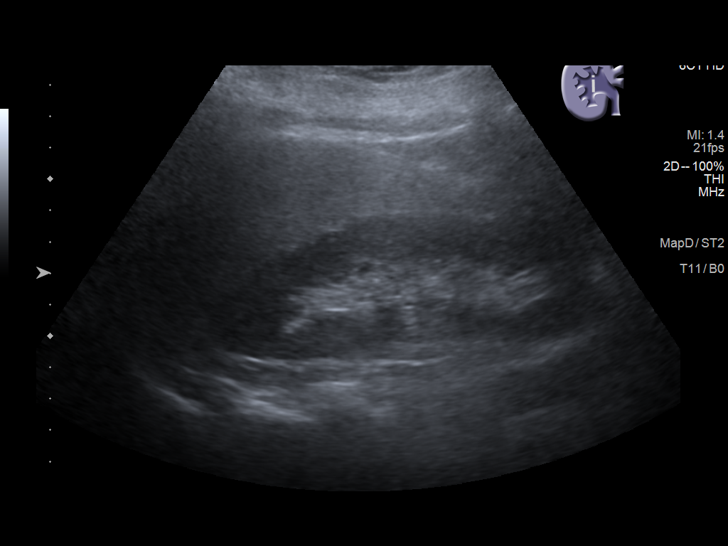
[im 8/43]
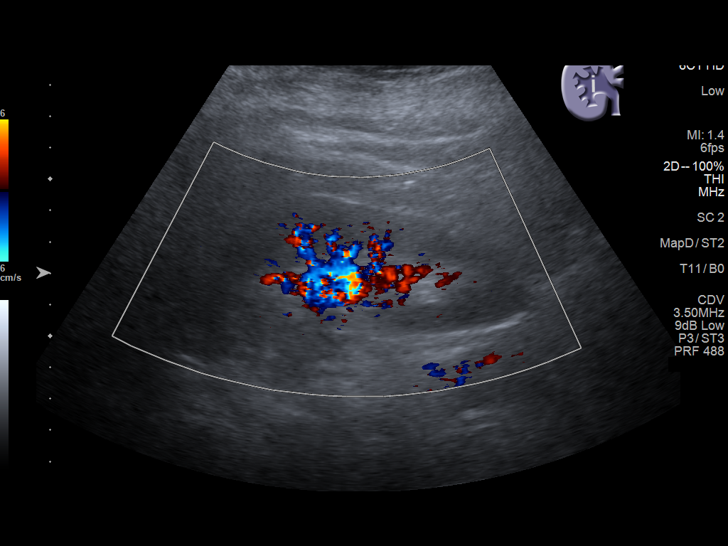
[im 11/43]
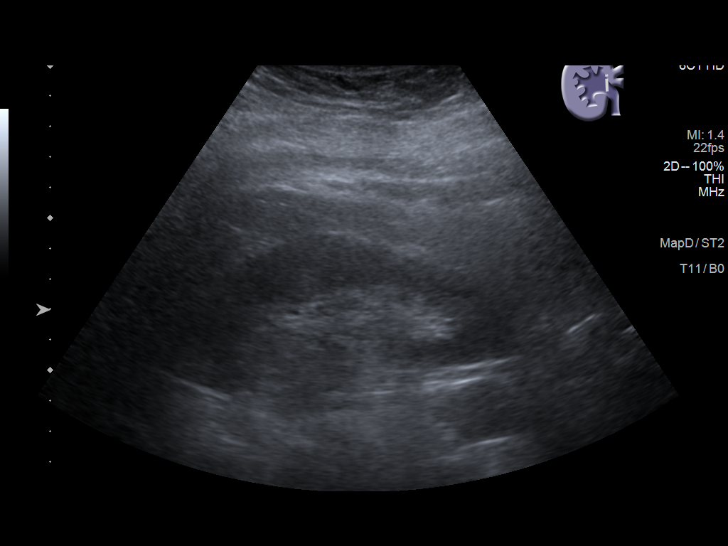
[im 15/43]
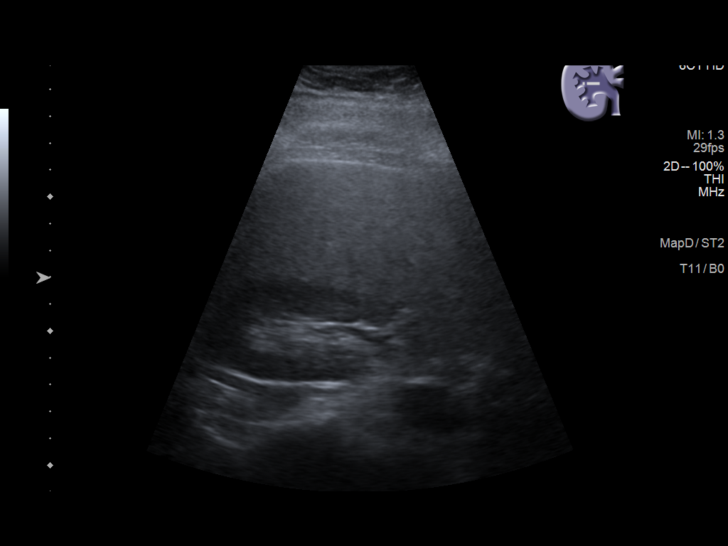
[im 16/43]
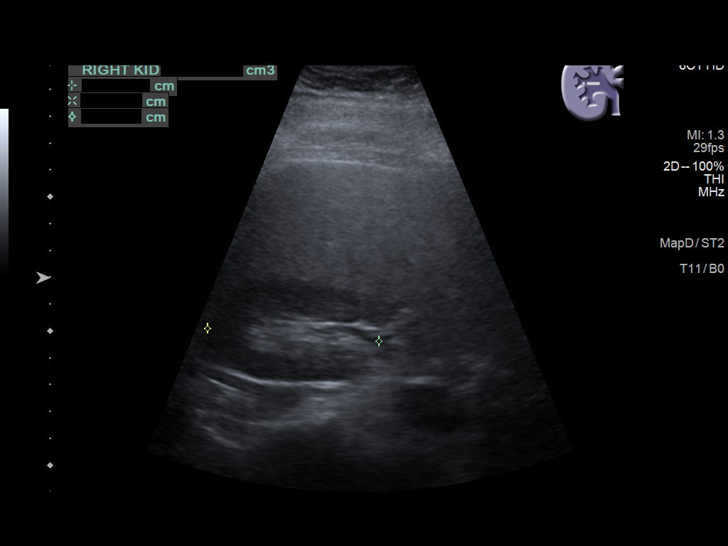
[im 20/43]
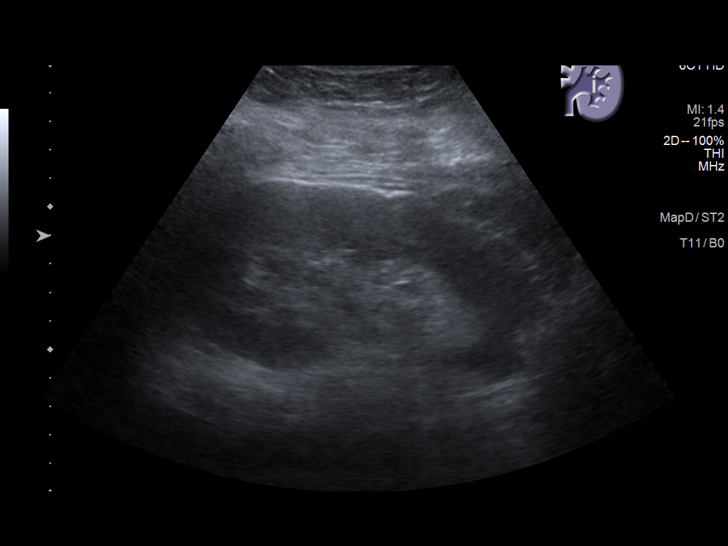
[im 23/43]
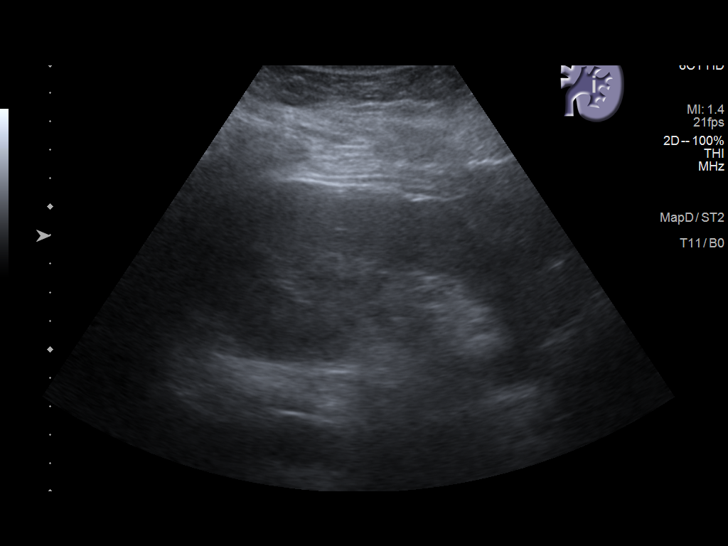
[im 27/43]
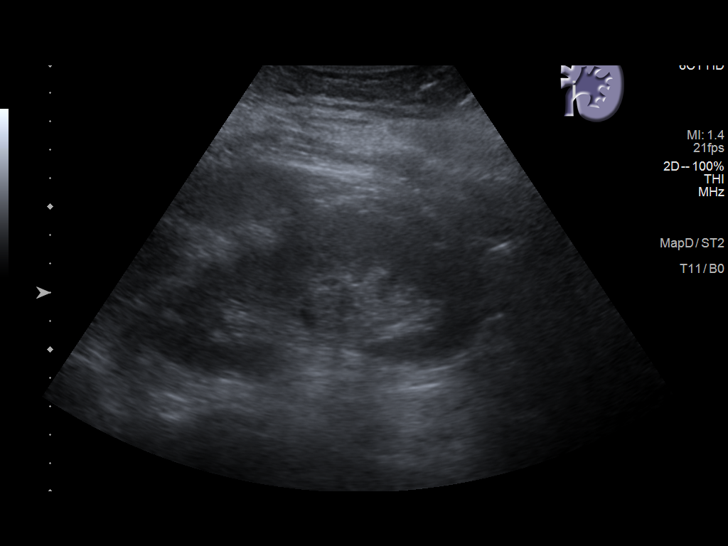
[im 29/43]
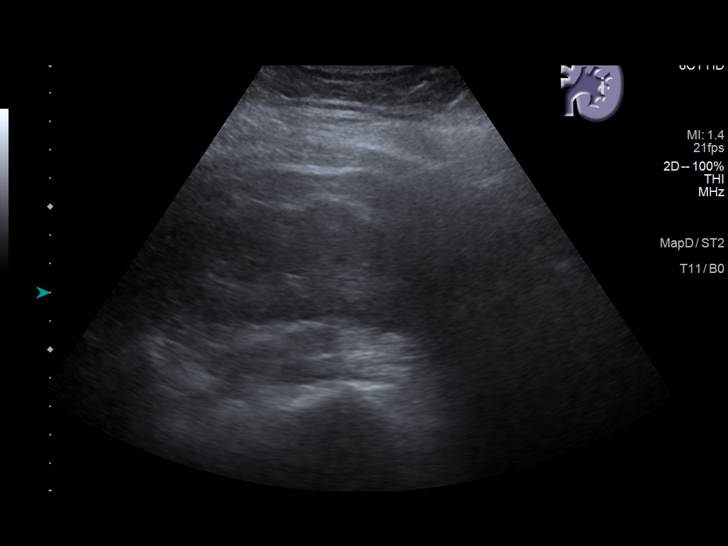
[im 32/43]
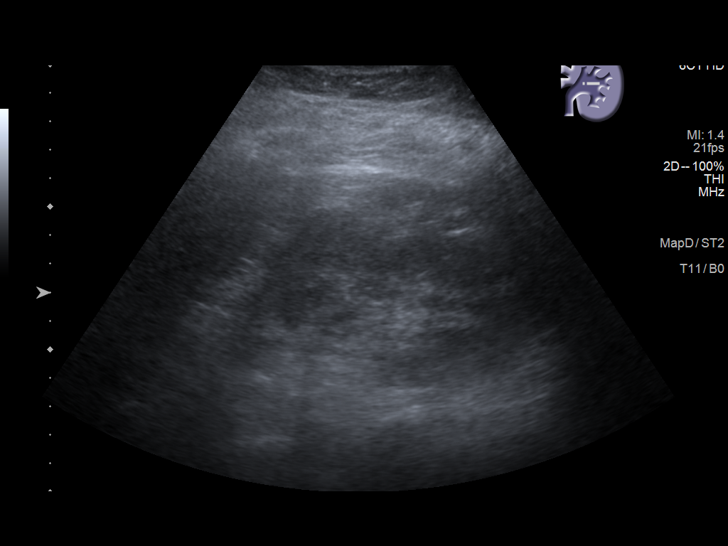
[im 36/43]
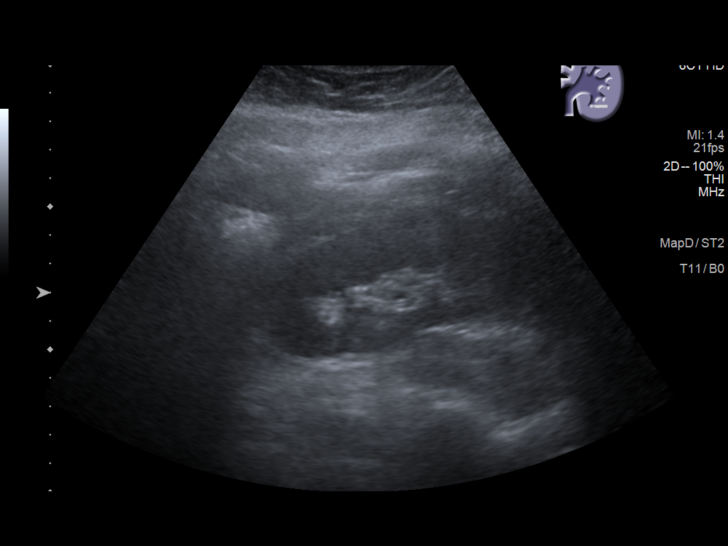
[im 39/43]
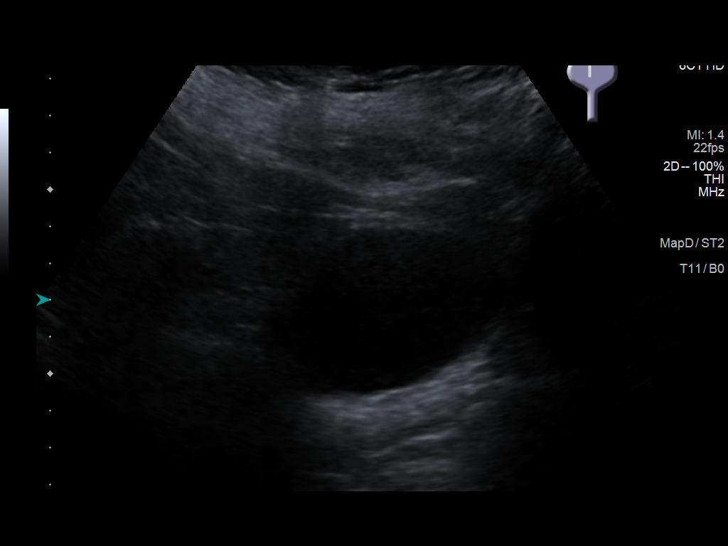
[im 43/43]
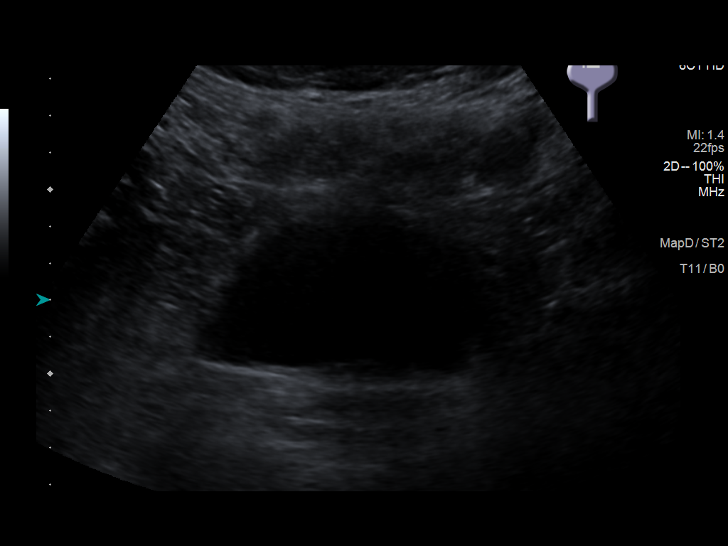

[14 of 25 positions shown; findings below may reference images not displayed]

FINDINGS: Right Kidney:

Renal measurements: 12.4 x 4.1 x 6.4 cm = volume: 169 mL.
Echogenicity within normal limits. No mass or hydronephrosis
visualized.

Left Kidney:

Renal measurements: 13.3 x 5.7 x 4.5 cm = volume: 179 mL.
Echogenicity within normal limits. No mass or hydronephrosis
visualized.

Bladder:

Appears normal for degree of bladder distention.

Other:

None.
IMPRESSION: Unremarkable bilateral renal ultrasound.

## 2023-04-02 ENCOUNTER — Ambulatory Visit (INDEPENDENT_AMBULATORY_CARE_PROVIDER_SITE_OTHER): Payer: 59 | Admitting: Professional Counselor

## 2023-04-02 DIAGNOSIS — F603 Borderline personality disorder: Secondary | ICD-10-CM | POA: Diagnosis not present

## 2023-04-02 NOTE — Progress Notes (Signed)
  THERAPIST PROGRESS NOTE  Virtual Visit via Video Note  I connected with Barbara Pennington on 04/02/23 at  8:00 AM EDT by a video enabled telemedicine application and verified that I am speaking with the correct person using two identifiers.  Location: Patient: Community Provider: Office   I discussed the limitations of evaluation and management by telemedicine and the availability of in person appointments. The patient expressed understanding and agreed to proceed.   I discussed the assessment and treatment plan with the patient. The patient was provided an opportunity to ask questions and all were answered. The patient agreed with the plan and demonstrated an understanding of the instructions.   The patient was advised to call back or seek an in-person evaluation if the symptoms worsen or if the condition fails to improve as anticipated.  I provided 54 minutes of non-face-to-face time during this encounter. Barbara Pennington, [facility]  Session Time: 8:00 AM - 8:54 AM   Participation Level: Active  Behavioral Response: Well Groomed, Alert, Euthymic  Type of Therapy: Individual Therapy  Treatment Goals addressed:  Active Anxiety  LTG: "I guess working through all the issues from the past. Learning how to better cope with stress."                Start:  02/05/23   End:  02/04/24   STG: "I don't deal with stress well. I get overwhelmed. I shut down. I get angry. I can't communicate my thoughts." To improve knowledge of DBT skills to reduce sxs per self report over the next 12 weeks.    ProgressTowards Goals: Progressing  Interventions: Assertiveness Training  Summary: Barbara Pennington is a 43 y.o. female who presents with a history of anxiety, depression, BPD, and PTSD. She stated things are going really well. She enjoyed the weekend and participated in "doggy for a day," where she babysits a dog from the shelter for the day. She has done this 3x and always gets the same dog. She would  like to foster him but her boyfriend isn't agreeing to this. Barbara Pennington took notes on DEAR MAN skill and asked questions for clarification. She noted work is offering remote option 2x week so she'd rather Merchandiser, retail with dog situation. She was receptive to feedback. She expressed her concerns with being assertive and asked for homework to help. She received copy of worksheets to practice communication skills.   Therapist Response: Conducted session with Barbara Pennington. Began session with check-in/update since previous session. Utilized empathetic and reflective listening. Explained and modeled DEAR MAN skill. Answered clarifying questions. Offered feedback on Barbara Pennington's practice of skill. Provided homework - worksheets to practice communication styles. Scheduled additional appointment and concluded session.   Suicidal/Homicidal: No  Plan: Return again in 2 weeks.  Diagnosis: Borderline personality disorder (HCC)  Collaboration of Care: Medication Management AEB chart review  Patient/Guardian was advised Release of Information must be obtained prior to any record release in order to collaborate their care with an outside provider. Patient/Guardian was advised if they have not already done so to contact the registration department to sign all necessary forms in order for [name] to release information regarding their care.   Consent: Patient/Guardian gives verbal consent for treatment and assignment of benefits for services provided during this visit. Patient/Guardian expressed understanding and agreed to proceed.   Barbara Pennington, [facility] 04/02/2023

## 2023-04-10 ENCOUNTER — Encounter: Payer: Self-pay | Admitting: Student in an Organized Health Care Education/Training Program

## 2023-04-12 ENCOUNTER — Telehealth: Payer: Self-pay | Admitting: Student in an Organized Health Care Education/Training Program

## 2023-04-12 NOTE — Telephone Encounter (Signed)
 LMTCB. E2C2 please ask the following questions when the patient calls back.   How long have you been out of your Symbicort/Airsupra? Do you feel like your symptoms started after you stopped using your inhaler? How long have your symptoms been going on for? Any fevers, chills or sweats? Any cough? If so are you getting anything up? What color? Any increased SOB? Any wheezing? Do you monitor your oxygen at home? What medications do you take? Covid test?

## 2023-04-12 NOTE — Telephone Encounter (Signed)
 Patient states having symptoms of shortness of breath and cough. Pharmacy is [facility] [address] Patient phone number is [phone].

## 2023-04-15 NOTE — Telephone Encounter (Signed)
 LMTCB x2. E2C2 please advise when patient calls back.

## 2023-04-18 ENCOUNTER — Ambulatory Visit: Payer: 59 | Admitting: Professional Counselor

## 2023-04-18 DIAGNOSIS — F4310 Post-traumatic stress disorder, unspecified: Secondary | ICD-10-CM | POA: Diagnosis not present

## 2023-04-18 NOTE — Progress Notes (Unsigned)
  THERAPIST PROGRESS NOTE  Virtual Visit via Video Note  I connected with Barbara Pennington on 04/18/23 at  4:00 PM EDT by a video enabled telemedicine application and verified that I am speaking with the correct person using two identifiers.  Location: Patient: Home Provider: Office   I discussed the limitations of evaluation and management by telemedicine and the availability of in person appointments. The patient expressed understanding and agreed to proceed.  I discussed the assessment and treatment plan with the patient. The patient was provided an opportunity to ask questions and all were answered. The patient agreed with the plan and demonstrated an understanding of the instructions.   The patient was advised to call back or seek an in-person evaluation if the symptoms worsen or if the condition fails to improve as anticipated.  I provided 58 minutes of non-face-to-face time during this encounter. Edmonia Lynch, [facility]  Session Time: 4:02 PM - 5:00 PM  Participation Level: Active  Behavioral Response: Neat and Well GroomedAlertDysphoric  Type of Therapy: Individual Therapy  Treatment Goals addressed: Active Anxiety  LTG: "I guess working through all the issues from the past. Learning how to better cope with stress."                Start:  02/05/23   End:  02/04/24   STG: "I don't deal with stress well. I get overwhelmed. I shut down. I get angry. I can't communicate my thoughts." To improve knowledge of DBT skills to reduce sxs per self report over the next 12 weeks.    ProgressTowards Goals: Progressing  Interventions: CBT, Motivational Interviewing, and Supportive Assertive Training  Summary: Barbara Pennington is a 43 y.o. female who presents with  a history of anxiety, depression, BPD, and PTSD.  . Dog, children, infidelity, neighbor DEAR MAN emotion support vs service dog  Suicidal/Homicidal: No  Therapist Response: Conducted session with . Began session with  check-in/update since previous session. Utilized empathetic and reflective listening. Scheduled additional appointment and concluded session.   Plan: Return again in 2 weeks.  Diagnosis: PTSD (post-traumatic stress disorder)  Collaboration of Care: Medication Management AEB chart review  Patient/Guardian was advised Release of Information must be obtained prior to any record release in order to collaborate their care with an outside provider. Patient/Guardian was advised if they have not already done so to contact the registration department to sign all necessary forms in order for [name] to release information regarding their care.   Consent: Patient/Guardian gives verbal consent for treatment and assignment of benefits for services provided during this visit. Patient/Guardian expressed understanding and agreed to proceed.   Edmonia Lynch, [facility] 04/18/2023

## 2023-04-18 NOTE — Telephone Encounter (Signed)
 Patient has not returned my calls or my MyChart messages.I have mailed her a letter to contact our office.   Closing this encounter per office protocol.

## 2023-04-26 ENCOUNTER — Other Ambulatory Visit: Payer: Self-pay | Admitting: Psychiatry

## 2023-04-26 DIAGNOSIS — F424 Excoriation (skin-picking) disorder: Secondary | ICD-10-CM

## 2023-04-26 DIAGNOSIS — F4310 Post-traumatic stress disorder, unspecified: Secondary | ICD-10-CM

## 2023-04-26 DIAGNOSIS — F411 Generalized anxiety disorder: Secondary | ICD-10-CM

## 2023-04-26 DIAGNOSIS — Z8659 Personal history of other mental and behavioral disorders: Secondary | ICD-10-CM

## 2023-05-02 ENCOUNTER — Ambulatory Visit (INDEPENDENT_AMBULATORY_CARE_PROVIDER_SITE_OTHER): Payer: 59 | Admitting: Professional Counselor

## 2023-05-02 DIAGNOSIS — F411 Generalized anxiety disorder: Secondary | ICD-10-CM

## 2023-05-02 NOTE — Progress Notes (Signed)
  THERAPIST PROGRESS NOTE  Virtual Visit via Video Note  I connected with Barbara Pennington on 05/06/23 at  4:00 PM EDT by a video enabled telemedicine application and verified that I am speaking with the correct person using two identifiers.  Location: Patient: Home Provider: Office   I discussed the limitations of evaluation and management by telemedicine and the availability of in person appointments. The patient expressed understanding and agreed to proceed.   I discussed the assessment and treatment plan with the patient. The patient was provided an opportunity to ask questions and all were answered. The patient agreed with the plan and demonstrated an understanding of the instructions.   The patient was advised to call back or seek an in-person evaluation if the symptoms worsen or if the condition fails to improve as anticipated.  I provided 59 minutes of non-face-to-face time during this encounter. Len Quale, [facility]  Session Time: 4:04 PM - 5:03 PM  Participation Level: Active  Behavioral Response: Neat and Well GroomedAlertDysphoric  Type of Therapy: Individual Therapy  Treatment Goals addressed: Active Anxiety  LTG: "I guess working through all the issues from the past. Learning how to better cope with stress."                Start:  02/05/23   End:  02/04/24   STG: "I don't deal with stress well. I get overwhelmed. I shut down. I get angry. I can't communicate my thoughts." To improve knowledge of DBT skills to reduce sxs per self report over the next 12 weeks.    ProgressTowards Goals: Progressing  Interventions: CBT, Motivational Interviewing, and Supportive and Assertiveness Training  Summary: Barbara Pennington is a 43 y.o. female who presents with a history of anxiety, depression, BPD, and PTSD. She appeared alert and oriented x5. She provided updates with work, home, and relationship. Suan noted struggles with anxiety and seeking to get the dog more often as a  coping mechanism. She was receptive to review of coping skills. She engaged in practicing DEAR MAN. She reported it is still hard to change communication pattern/styles. She is aware practicing in low risk situations with safe people are better to start with. Chane reported she would like to engage in the DBT Skills group.   Therapist Response: Conducted session with Barbara Pennington. Began session with check-in/update since previous session. Utilized empathetic and reflective listening. Used open-ended questions to facilitate discussion. Normalized struggles around anxiety. Reviewed coping skills and sent handouts so Florabelle can practice at home. Engaged in more practice on assertiveness - DEAR MAN skill using various scenarios and providing feedback. Discussed engagement in DBT Skills group. Scheduled additional appointment and concluded session.   Suicidal/Homicidal: No  Plan: Return again in 2 weeks.  Diagnosis: GAD (generalized anxiety disorder)  Collaboration of Care: Medication Management AEB chart review  Patient/Guardian was advised Release of Information must be obtained prior to any record release in order to collaborate their care with an outside provider. Patient/Guardian was advised if they have not already done so to contact the registration department to sign all necessary forms in order for us  to release information regarding their care.   Consent: Patient/Guardian gives verbal consent for treatment and assignment of benefits for services provided during this visit. Patient/Guardian expressed understanding and agreed to proceed.   Len Quale, [facility] 05/06/2023

## 2023-05-07 ENCOUNTER — Encounter: Payer: Self-pay | Admitting: Family Medicine

## 2023-05-08 ENCOUNTER — Ambulatory Visit (INDEPENDENT_AMBULATORY_CARE_PROVIDER_SITE_OTHER): Admitting: Family Medicine

## 2023-05-08 ENCOUNTER — Encounter: Payer: Self-pay | Admitting: Family Medicine

## 2023-05-08 VITALS — BP 118/76 | HR 96 | Resp 16 | Ht 65.0 in | Wt 336.7 lb

## 2023-05-08 DIAGNOSIS — L980 Pyogenic granuloma: Secondary | ICD-10-CM | POA: Diagnosis not present

## 2023-05-08 NOTE — Telephone Encounter (Signed)
 Provider has spoken with pt and nothing further is needed.

## 2023-05-08 NOTE — Progress Notes (Signed)
 Name: Barbara Pennington   MRN: [id number]    DOB: 11/07/1980   Date:05/08/2023       Progress Note  Subjective  Chief Complaint  Chief Complaint  Patient presents with   Referral    Discussed the use of AI scribe software for clinical note transcription with the patient, who gave verbal consent to proceed.  History of Present Illness Barbara Pennington is a 43 year old female who presents with a bleeding bump on her right upper arm.  Three weeks ago, she noticed a bump on her right upper arm that appeared suddenly. The bump has since grown and then stopped growing, but it has been bleeding, especially when she tries to remove the bandage. She describes the bleeding as significant, stating 'it looks like I killed someone in my bathroom' when it bleeds unexpectedly.  She has not experienced any other symptoms associated with the bump, and there is no pain or discomfort aside from the bleeding. There is no history of trauma to the area, and she has not attempted any home remedies to address the bump.  No systemic symptoms such as fever, weight loss, or fatigue. No previous similar occurrences or family history of similar conditions.    Patient Active Problem List   Diagnosis Date Noted   High risk medication use 12/17/2022   Chronic cough 02/13/2022   Skin-picking disorder 12/19/2021   Adjustment disorder with depressed mood 06/13/2021   Closed nondisplaced longitudinal fracture of right patella 06/09/2021   GAD (generalized anxiety disorder) 05/05/2021   Depressive disorder 05/05/2021   History of borderline personality disorder 05/05/2021   History of alcohol abuse 05/05/2021   Internal derangement of left knee 04/03/2021   Primary osteoarthritis of left knee 04/03/2021   Metabolic syndrome 03/24/2021   Impaired fasting glucose 12/06/2020   Lymphedema 12/06/2020   Moderate episode of recurrent major depressive disorder (HCC) 02/08/2020   CKD (chronic kidney disease) stage 3, GFR 30-59  ml/min (HCC) 06/11/2019   PTSD (post-traumatic stress disorder) 01/14/2019   Borderline personality disorder (HCC) 01/14/2019   Anxiety 01/14/2019   Mixed hyperlipidemia 01/14/2019   Morbid obesity with BMI of 50.0-59.9, adult (HCC) 01/14/2019   OSA on CPAP 01/14/2019   Asthma, mild persistent 04/30/2018   Low back pain 08/20/2017   Fibroadenoma of breast, 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   Essential hypertension     Social History   Tobacco Use   Smoking status: Former    Current packs/day: 0.00    Average packs/day: 0.3 packs/day for 10.0 years (2.5 ttl pk-yrs)    Types: Cigarettes    Start date: 07/04/2004    Quit date: 07/05/2014    Years since quitting: 8.8   Smokeless tobacco: Never  Substance Use Topics   Alcohol use: Yes    Comment: rare     Current Outpatient Medications:    acetaminophen  (TYLENOL ) 500 MG tablet, Take 1,000 mg by mouth every 6 (six) hours as needed for moderate pain (pain score 4-6)., Disp: , Rfl:    Albuterol -Budesonide  (AIRSUPRA ) 90-80 MCG/ACT AERO, Inhale 2 puffs into the lungs 4 (four) times daily as needed., Disp: 10.7 g, Rfl: 1   amLODipine  (NORVASC ) 2.5 MG tablet, Take 1 tablet (2.5 mg total) by mouth daily., Disp: 90 tablet, Rfl: 0   atorvastatin  (LIPITOR) 20 MG tablet, Take 1 tablet (20 mg total) by mouth every morning., Disp: 90 tablet, Rfl: 1   budesonide -formoterol  (SYMBICORT ) 160-4.5 MCG/ACT inhaler, Inhale 2 puffs into the lungs in the morning and  at bedtime., Disp: 1 each, Rfl: 12   buPROPion  (WELLBUTRIN  XL) 150 MG 24 hr tablet, TAKE 1 TABLET (150 MG TOTAL) BY MOUTH DAILY WITH BREAKFAST. STOP WELLBUTRIN  XL 300 MG, Disp: 90 tablet, Rfl: 0   busPIRone  (BUSPAR ) 30 MG tablet, TAKE 1 TABLET BY MOUTH 2 TIMES DAILY., Disp: 180 tablet, Rfl: 1   desloratadine  (CLARINEX ) 5 MG tablet, Take 1 tablet (5 mg total) by mouth daily., Disp: 90 tablet, Rfl: 1   diclofenac  (VOLTAREN ) 75 MG EC tablet, Take 75 mg by mouth 2 (two) times daily., Disp: , Rfl:    escitalopram   (LEXAPRO ) 20 MG tablet, TAKE 1 TABLET BY MOUTH EVERY DAY, Disp: 90 tablet, Rfl: 1   fluticasone  (FLONASE ) 50 MCG/ACT nasal spray, Place 1 spray into both nostrils daily., Disp: 48 mL, Rfl: 1   Melatonin 10 MG CAPS, Take 10-20 mg by mouth at bedtime., Disp: , Rfl:    modafinil  (PROVIGIL ) 100 MG tablet, Take 1 tablet (100 mg total) by mouth daily., Disp: 30 tablet, Rfl: 0   montelukast  (SINGULAIR ) 10 MG tablet, Take 1 tablet (10 mg total) by mouth at bedtime., Disp: 90 tablet, Rfl: 1   valsartan -hydrochlorothiazide  (DIOVAN -HCT) 160-12.5 MG tablet, TAKE 1 TABLET BY MOUTH EVERY DAY, Disp: 90 tablet, Rfl: 0   benzonatate  (TESSALON ) 100 MG capsule, Take 1-2 capsules (100-200 mg total) by mouth 3 (three) times daily as needed., Disp: 60 capsule, Rfl: 0   clonazePAM  (KLONOPIN ) 0.5 MG tablet, Take 1 tablet (0.5 mg total) by mouth daily as needed for anxiety. Please limit use (Patient not taking: Reported on 03/04/2023), Disp: 15 tablet, Rfl: 0   gabapentin  (NEURONTIN ) 300 MG capsule, Take 1 capsule by mouth 3 (three) times daily., Disp: , Rfl:    Semaglutide -Weight Management 1 MG/0.5ML SOAJ, Inject 1 mg into the skin once a week for 28 days. (Patient not taking: Reported on 03/04/2023), Disp: 2 mL, Rfl: 0  Allergies  Allergen Reactions   Latex Rash   Metformin And Related     diarrhea    ROS  Ten systems reviewed and is negative except as mentioned in HPI    Objective  Vitals:   05/08/23 1319  BP: 118/76  Pulse: 96  Resp: 16  SpO2: 98%  Weight: (!) 336 lb 11.2 oz (152.7 kg)  Height: 5\' 5"  (1.651 m)    Body mass index is 56.03 kg/m.  Physical Exam CONSTITUTIONAL: Patient appears well-developed and well-nourished. No distress. HEENT: Head atraumatic, normocephalic, neck supple. CARDIOVASCULAR: Normal rate, regular rhythm and normal heart sounds. No murmur heard. No BLE edema. PULMONARY: Effort normal and breath sounds normal. No respiratory distress. ABDOMINAL: There is no tenderness or  distention. MUSCULOSKELETAL: Normal gait. Without gross motor or sensory deficit. PSYCHIATRIC: Patient has a normal mood and affect. Behavior is normal. Judgment and thought content normal. SKIN: red and friable lesion on right upper arm, pedunculated   Assessment & Plan Pyogenic granuloma Pyogenic granuloma on right upper arm, bleeding, growth ceased. Presentation consistent with pyogenic granuloma. Malignancy not suspected. Surgical excision recommended. - Refer to dermatologist for evaluation and potential surgical excision. - If dermatologist unavailable, refer to general surgeon for excision. - Advise to cover lesion with gauze and apply petroleum jelly to avoid getting stuck . - Instruct to maintain regular follow-up and await specialist appointment.

## 2023-05-09 ENCOUNTER — Ambulatory Visit
Attending: Student in an Organized Health Care Education/Training Program | Admitting: Student in an Organized Health Care Education/Training Program

## 2023-05-09 ENCOUNTER — Encounter: Payer: Self-pay | Admitting: Student in an Organized Health Care Education/Training Program

## 2023-05-09 VITALS — BP 120/88 | HR 71 | Temp 97.2°F | Resp 16 | Ht 65.0 in | Wt 336.0 lb

## 2023-05-09 DIAGNOSIS — M1712 Unilateral primary osteoarthritis, left knee: Secondary | ICD-10-CM | POA: Insufficient documentation

## 2023-05-09 DIAGNOSIS — Z9889 Other specified postprocedural states: Secondary | ICD-10-CM | POA: Insufficient documentation

## 2023-05-09 DIAGNOSIS — G8929 Other chronic pain: Secondary | ICD-10-CM | POA: Insufficient documentation

## 2023-05-09 DIAGNOSIS — M25562 Pain in left knee: Secondary | ICD-10-CM | POA: Insufficient documentation

## 2023-05-09 NOTE — Progress Notes (Signed)
 PROVIDER NOTE: Interpretation of information contained herein should be left to medically-trained personnel. Specific patient instructions are provided elsewhere under "Patient Instructions" section of medical record. This document was created in part using AI and STT-dictation technology, any transcriptional errors that may result from this process are unintentional.  Patient: Barbara Pennington  Service: E/M Encounter  Provider: Cephus Collin, MD  DOB: 25-Mar-1980  Delivery: Face-to-face  Specialty: Interventional Pain Management  MRN: [id number]  Setting: Ambulatory outpatient facility  Specialty designation: 09  Type: New Patient  Location: Outpatient office facility  PCP: Sowles, Krichna, MD  DOS: 05/09/2023    Referring Prov.: Lorri Rota, MD   Primary Reason(s) for Visit: Encounter for initial evaluation of one or more chronic problems (new to examiner) potentially causing chronic pain, and posing a threat to normal musculoskeletal function. (Level of risk: High) CC: Knee Pain (bilat)  HPI  Barbara Pennington is a 43 y.o. year old, female patient, who comes for the first time to our practice referred by Lorri Rota, MD for our initial evaluation of her chronic pain. She has Essential hypertension; Fibroadenoma of breast, left; Low back pain; Asthma, mild persistent; PTSD (post-traumatic stress disorder); Borderline personality disorder (HCC); Anxiety; Mixed hyperlipidemia; Morbid obesity with BMI of 50.0-59.9, adult (HCC); OSA on CPAP; CKD (chronic kidney disease) stage 3, GFR 30-59 ml/min (HCC); Moderate episode of recurrent major depressive disorder (HCC); Impaired fasting glucose; Lymphedema; Metabolic syndrome; Internal derangement of left knee; Arthritis of left knee; GAD (generalized anxiety disorder); Depressive disorder; History of borderline personality disorder; History of alcohol abuse; Closed nondisplaced longitudinal fracture of right patella; Adjustment disorder with depressed mood; Skin-picking  disorder; Chronic cough; High risk medication use; Chronic pain of left knee; and Status post medial meniscus repair (RIGHT) on their problem list. Today she comes in for evaluation of her Knee Pain (bilat)  Pain Assessment: Location: Right, Left (left is worse) Knee Radiating: denies Onset: More than a month ago Duration: Chronic pain Quality: Dull, Crushing, Sharp Severity: 3 /10 (subjective, self-reported pain score)  Effect on ADL: limits adls Timing: Constant Modifying factors: elevate, ice, braces, ibuprofen  BP: 120/88  HR: 71  Onset and Duration: Sudden, Gradual, and Present longer than 3 months Cause of pain: Work related accident or event Severity: Getting worse, NAS-11 at its worse: 8/10, NAS-11 at its best: 3/10, NAS-11 now: 3/10, and NAS-11 on the average: 4/10 Timing: Not influenced by the time of the day, During activity or exercise, After activity or exercise, and After a period of immobility Aggravating Factors: Climbing, Kneeling, Lifiting, Motion, Prolonged sitting, and Prolonged standing Alleviating Factors: Stretching, Cold packs, Hot packs, Medications, Resting, and Sleeping Associated Problems: Fatigue, Inability to concentrate, Sweating, Weakness, Pain that wakes patient up, and Pain that does not allow patient to sleep Quality of Pain: Aching, Agonizing, Annoying, Constant, Deep, Disabling, Dull, Exhausting, Fearful, Feeling of constriction, Horrible, Nagging, Sharp, Stabbing, Tender, Throbbing, and Toothache-like Previous Examinations or Tests: MRI scan Previous Treatments: The patient denies previous treatment  Barbara Pennington is being evaluated for possible interventional pain management therapies for the treatment of her chronic pain.    Discussed the use of AI scribe software for clinical note transcription with the patient, who gave verbal consent to proceed.  History of Present Illness   Barbara Pennington is a 43 year old female who presents with chronic left  knee pain. She was referred by Dr. Lydia Sams for consideration of a genicular nerve block for her chronic knee pain.  She experiences severe  and persistent pain in her left knee, with minor pain in her right knee that is not a significant concern. She has not undergone surgery on her left knee, but she has had a meniscus surgery on her right knee.  For her left knee pain, she has received cortisone shots and a series of three gel injections spaced a week apart, without benefit. She has not participated in physical therapy for her left knee, though she has attempted home exercises. She is currently taking meloxicam .  She experiences instability in her left knee, with episodes where it feels like it might give out, and it has done so in the past. She ambulates independently without the use of a support device or cane. She is concerned about a potential tear in her left knee, as it feels similar to her right knee prior to surgery. No imaging has been ordered to confirm this suspicion.      Meds   Current Outpatient Medications:    acetaminophen  (TYLENOL ) 500 MG tablet, Take 1,000 mg by mouth every 6 (six) hours as needed for moderate pain (pain score 4-6)., Disp: , Rfl:    Albuterol -Budesonide  (AIRSUPRA ) 90-80 MCG/ACT AERO, Inhale 2 puffs into the lungs 4 (four) times daily as needed., Disp: 10.7 g, Rfl: 1   amLODipine  (NORVASC ) 2.5 MG tablet, Take 1 tablet (2.5 mg total) by mouth daily., Disp: 90 tablet, Rfl: 0   atorvastatin  (LIPITOR) 20 MG tablet, Take 1 tablet (20 mg total) by mouth every morning., Disp: 90 tablet, Rfl: 1   budesonide -formoterol  (SYMBICORT ) 160-4.5 MCG/ACT inhaler, Inhale 2 puffs into the lungs in the morning and at bedtime., Disp: 1 each, Rfl: 12   buPROPion  (WELLBUTRIN  XL) 150 MG 24 hr tablet, TAKE 1 TABLET (150 MG TOTAL) BY MOUTH DAILY WITH BREAKFAST. STOP WELLBUTRIN  XL 300 MG, Disp: 90 tablet, Rfl: 0   busPIRone  (BUSPAR ) 30 MG tablet, TAKE 1 TABLET BY MOUTH 2 TIMES DAILY., Disp:  180 tablet, Rfl: 1   desloratadine  (CLARINEX ) 5 MG tablet, Take 1 tablet (5 mg total) by mouth daily., Disp: 90 tablet, Rfl: 1   diclofenac  (VOLTAREN ) 75 MG EC tablet, Take 75 mg by mouth 2 (two) times daily., Disp: , Rfl:    escitalopram  (LEXAPRO ) 20 MG tablet, TAKE 1 TABLET BY MOUTH EVERY DAY, Disp: 90 tablet, Rfl: 1   fluticasone  (FLONASE ) 50 MCG/ACT nasal spray, Place 1 spray into both nostrils daily., Disp: 48 mL, Rfl: 1   gabapentin  (NEURONTIN ) 300 MG capsule, Take 1 capsule by mouth 3 (three) times daily., Disp: , Rfl:    ibuprofen  (ADVIL ) 200 MG tablet, Take 600 mg by mouth every 6 (six) hours as needed., Disp: , Rfl:    Melatonin 10 MG CAPS, Take 10-20 mg by mouth at bedtime., Disp: , Rfl:    modafinil  (PROVIGIL ) 100 MG tablet, Take 1 tablet (100 mg total) by mouth daily., Disp: 30 tablet, Rfl: 0   montelukast  (SINGULAIR ) 10 MG tablet, Take 1 tablet (10 mg total) by mouth at bedtime., Disp: 90 tablet, Rfl: 1   valsartan -hydrochlorothiazide  (DIOVAN -HCT) 160-12.5 MG tablet, TAKE 1 TABLET BY MOUTH EVERY DAY, Disp: 90 tablet, Rfl: 0   clonazePAM  (KLONOPIN ) 0.5 MG tablet, Take 1 tablet (0.5 mg total) by mouth daily as needed for anxiety. Please limit use (Patient not taking: Reported on 02/18/2023), Disp: 15 tablet, Rfl: 0  Imaging Review    Narrative CLINICAL DATA:  Acute presentation with spinal pain.  EXAM: CERVICAL SPINE - 2-3 VIEW  COMPARISON:  03/12/2012  FINDINGS: No malalignment. No focal bone finding. No disc space narrowing. No facet arthropathy.  IMPRESSION: Negative cervical spine radiographs.   Electronically Signed By: Bettylou Brunner M.D. On: 08/06/2017 19:05   DG Thoracic Spine 2 View  Narrative CLINICAL DATA:  Acute presentation with spinal pain.  EXAM: THORACIC SPINE 2 VIEWS  COMPARISON:  05/16/2015  FINDINGS: Very minimal spinal curvature, not significant. No focal bone lesion. No disc space narrowing. Posteromedial ribs appear  normal.  IMPRESSION: No significant finding. Minimal spinal curvature, not likely significant.   Electronically Signed By: Bettylou Brunner M.D. On: 08/06/2017 19:06   Narrative CLINICAL DATA:  Acute presentation with back pain  EXAM: LUMBAR SPINE - COMPLETE 4+ VIEW  COMPARISON:  CT 01/13/2017  FINDINGS: Five lumbar type vertebral bodies show normal alignment. No disc space narrowing. Mild lower lumbar facet arthritis. Sacroiliac joints appear normal.  IMPRESSION: No advanced or likely significant finding. Mild lower lumbar facet arthritis.   Electronically Signed By: Bettylou Brunner M.D. On: 08/06/2017 19:07 DG Hip Unilat W or Wo Pelvis 2-3 Views Left  Narrative CLINICAL DATA:  Recent fall with left hip pain, initial encounter  EXAM: DG HIP (WITH OR WITHOUT PELVIS) 2-3V LEFT  COMPARISON:  None.  FINDINGS: No acute fracture or dislocation is noted. No soft tissue abnormality is seen. Degenerative changes of the hip joint are noted.  IMPRESSION: No acute abnormality noted.   Electronically Signed By: Violeta Grey M.D. On: 08/04/2017 13:36  Narrative CLINICAL DATA:  Knee trauma. Internal derangement suspected. Fall in shower 2 weeks ago. Heard a pop in left knee. Lateral pain.  EXAM: MRI OF THE LEFT KNEE WITHOUT CONTRAST  TECHNIQUE: Multiplanar, multisequence MR imaging of the knee was performed. No intravenous contrast was administered.  COMPARISON:  Left knee radiographs 03/10/2021  FINDINGS: MENISCI  Medial meniscus: There is mild intermediate proton density signal intrasubstance degeneration within the junction of the body and posterior horn of the medial meniscus. No tear is seen extending through an articular surface.  Lateral meniscus:  Intact.  LIGAMENTS  Cruciates: The ACL and PCL are intact.  Collaterals: The medial collateral ligament is intact. The fibular collateral ligament, biceps femoris tendon, iliotibial band, and popliteus  tendon are intact.  CARTILAGE  Patellofemoral: There is moderate thinning of the superior aspect of the junction of the patellar apex and lateral patellar facet cartilage with moderate subchondral cystic change (axial image 7 and sagittal image 21). Mild thinning of the lateral trochlear cartilage.  Medial: Mild weight-bearing medial femoral condyle cartilage thinning.  Lateral:  Intact.  Joint: Smalljoint effusion. Normal Hoffa's fat pad. No plical thickening. Moderate edema and mild fluid within the deep subcutaneous fat anterior to the patellar tendon and proximal tibia, greatest at the level of the proximal tibia.  Popliteal Fossa:  No Baker's cyst.  Extensor Mechanism:  Intact quadriceps tendon and patellar tendon.  Bones:  No acute fracture or dislocation.  Other: None.  IMPRESSION:: IMPRESSION: 1. 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 2. Small joint effusion. 3. Intact menisci, cruciate ligaments, and collateral ligaments.   Electronically Signed By: Bertina Broccoli M.D. On: 03/28/2021 13:10  MR KNEE RIGHT WO CONTRAST  Narrative CLINICAL DATA:  Right knee pain. History of arthroscopic knee surgery in July 2023 for repair of a medial meniscus root tear.  EXAM: MRI OF THE RIGHT KNEE WITHOUT CONTRAST  TECHNIQUE: Multiplanar, multisequence MR imaging of the knee was performed. No intravenous contrast was administered.  COMPARISON:  MRIs  06/25/2021  FINDINGS: Examination is somewhat limited due to artifact related to the patient's body habitus with poor signal to noise ratio.  MENISCI  Medial meniscus: Surgical changes related to a prior posterior horn medial meniscus repair. There is moderate artifact and although I do not see an obvious recurrent tear there is persistent or recurrent meniscal protrusion medially estimated at 5 mm. Associated intrasubstance degenerative type meniscal signal changes as seen on the prior  study.  Lateral meniscus:  Intact  LIGAMENTS  Cruciates:  Intact  Collaterals: Moderate edema like signal changes in and around the [facility] which appears stable when compared to the prior study and is likely a combination of remote MCL injury and MCL and pes anserine bursitis.  CARTILAGE  Patellofemoral:  Moderate degenerative chondrosis  Medial: Moderate degenerative chondrosis with early spurring changes.  Lateral:  Mild degenerative chondrosis with early spurring.  Joint: Moderate-sized joint effusion. Superior and medial patellar plica.  Popliteal Fossa:  No popliteal mass or Baker's cyst.  Extensor Mechanism: The patella retinacular structures are intact and the quadriceps and patellar tendons are intact.  Bones: Surgical changes noted involving the medial tibia related to the meniscal repair. Patchy marrow edema involving the medial tibial plateau may be ongoing stress related process but no subchondral stress fracture.  Other: Unremarkable knee musculature.  IMPRESSION: 1. Surgical changes related to a prior posterior horn medial meniscus repair. There is moderate artifact and although I do not see an obvious recurrent tear there is persistent or recurrent meniscal protrusion medially estimated at 5 mm. 2. Intact ligamentous structures. Stable appearing thickening and inflammation type changes involving the MCL with MCL and pes anserine bursitis. 3. Patchy marrow edema involving the medial tibial plateau may be ongoing stress related process but no subchondral stress fracture. 4. Moderate-sized joint effusion. 5. Moderate tricompartmental degenerative chondrosis.   Electronically Signed By: Marrian Siva M.D. On: 10/17/2021 13:44   DG Knee Complete 4 Views Right  Narrative CLINICAL DATA:  Right knee pain after injury.  Subsequent encounter.  EXAM: RIGHT KNEE - COMPLETE 4+ VIEW  COMPARISON:  No prior right knee exams.  FINDINGS: Normal alignment.  No acute fracture. Subcortical lucency in the superior patella favors subchondral cyst. Joint spaces are preserved. No erosion or bony destruction. There may be a small knee joint effusion.  IMPRESSION: 1. No acute fracture or dislocation of the right knee. 2. Superior patellar subchondral cyst, may be degenerative or secondary to prior chondral injury.   Electronically Signed By: Chadwick Colonel M.D. On: 06/11/2021 01:35  Knee-L DG 4 views: Results for orders placed during the hospital encounter of 03/10/21  DG Knee Complete 4 Views Left  Narrative CLINICAL DATA:  Status post fall.  EXAM: LEFT KNEE - COMPLETE 4+ VIEW  COMPARISON:  None.  FINDINGS: No evidence of acute fracture or dislocation. No evidence of arthropathy or other focal bone abnormality. A very small joint effusion is noted.  IMPRESSION: 1. No acute osseous abnormality. 2. Very small joint effusion.   Electronically Signed By: Virgle Grime M.D. On: 03/11/2021 23:45    Narrative CLINICAL DATA:  Acute RIGHT ankle pain for 1 day. Initial encounter.  EXAM: RIGHT ANKLE - COMPLETE 3+ VIEW  COMPARISON:  09/07/1998 10 foot radiographs  FINDINGS: No fracture, subluxation or dislocation.  The joint spaces are unremarkable.  A small calcaneal spur is present.  No other focal bony abnormalities are noted.  IMPRESSION: Small calcaneal spur without other bony/joint abnormality.   Electronically Signed By:  Sundra Engel M.D.   Complexity Note: Imaging results reviewed.                         ROS  Cardiovascular: High blood pressure Pulmonary or Respiratory: Wheezing and difficulty taking a deep full breath (Asthma), Shortness of breath, Snoring , and Temporary stoppage of breathing during sleep Neurological: Curved spine Psychological-Psychiatric: Anxiousness, Depressed, Attempted suicide, and History of abuse Gastrointestinal: No reported gastrointestinal signs or symptoms such as  vomiting or evacuating blood, reflux, heartburn, alternating episodes of diarrhea and constipation, inflamed or scarred liver, or pancreas or irrregular and/or infrequent bowel movements Genitourinary: Kidney disease and Passing kidney stones Hematological: No reported hematological signs or symptoms such as prolonged bleeding, low or poor functioning platelets, bruising or bleeding easily, hereditary bleeding problems, low energy levels due to low hemoglobin or being anemic Endocrine: No reported endocrine signs or symptoms such as high or low blood sugar, rapid heart rate due to high thyroid levels, obesity or weight gain due to slow thyroid or thyroid disease Rheumatologic: Rheumatoid arthritis Musculoskeletal: Negative for myasthenia gravis, muscular dystrophy, multiple sclerosis or malignant hyperthermia Work History: Working full time  Allergies  Barbara Pennington is allergic to latex and metformin and related.  Laboratory Chemistry Profile   Renal Lab Results  Component Value Date   BUN 12 01/22/2023   CREATININE 0.85 01/22/2023   LABCREA 182 04/04/2018   BCR SEE NOTE: 01/22/2023   GFRAA 61 06/08/2020   GFRNONAA >60 01/10/2023   SPECGRAV 1.020 04/14/2020   PHUR 7.0 04/14/2020   PROTEINUR NEGATIVE 12/12/2022     Electrolytes Lab Results  Component Value Date   NA 142 01/22/2023   K 4.3 01/22/2023   CL 106 01/22/2023   CALCIUM  9.7 01/22/2023     Hepatic Lab Results  Component Value Date   AST 6 (L) 01/15/2023   ALT 12 01/15/2023   ALBUMIN 4.1 04/04/2020   ALKPHOS 45 04/04/2020   LIPASE 38 04/04/2020     ID Lab Results  Component Value Date   HIV NON-REACTIVE 11/08/2022   SARSCOV2NAA POSITIVE (A) 06/20/2020   PREGTESTUR NEGATIVE 04/04/2020     Bone Lab Results  Component Value Date   VD25OH 37 11/08/2022     Endocrine Lab Results  Component Value Date   GLUCOSE 87 01/22/2023   GLUCOSEU NEGATIVE 12/12/2022   HGBA1C 5.2 11/08/2022   TSH 1.83 12/15/2021      Neuropathy Lab Results  Component Value Date   VITAMINB12 309 11/08/2022   FOLATE 4.3 (L) 11/08/2022   HGBA1C 5.2 11/08/2022   HIV NON-REACTIVE 11/08/2022     CNS No results found for: "COLORCSF", "APPEARCSF", "RBCCOUNTCSF", "WBCCSF", "POLYSCSF", "LYMPHSCSF", "EOSCSF", "PROTEINCSF", "GLUCCSF", "JCVIRUS", "CSFOLI", "IGGCSF", "LABACHR", "ACETBL"   Inflammation (CRP: Acute  ESR: Chronic) Lab Results  Component Value Date   CRP 18.4 (H) 01/22/2023   ESRSEDRATE 2 06/08/2020   LATICACIDVEN 0.7 01/13/2017     Rheumatology No results found for: "RF", "ANA", "LABURIC", "URICUR", "LYMEIGGIGMAB", "LYMEABIGMQN", "HLAB27"   Coagulation Lab Results  Component Value Date   PLT 365 01/22/2023     Cardiovascular Lab Results  Component Value Date   TROPONINI <0.03 11/19/2017   HGB 13.7 01/22/2023   HCT 41.0 01/22/2023     Screening Lab Results  Component Value Date   SARSCOV2NAA POSITIVE (A) 06/20/2020   HIV NON-REACTIVE 11/08/2022   PREGTESTUR NEGATIVE 04/04/2020     Cancer No results found for: "CEA", "CA125", "LABCA2"  Allergens No results found for: "ALMOND", "APPLE", "ASPARAGUS", "AVOCADO", "BANANA", "BARLEY", "BASIL", "BAYLEAF", "GREENBEAN", "LIMABEAN", "WHITEBEAN", "BEEFIGE", "REDBEET", "BLUEBERRY", "BROCCOLI", "CABBAGE", "MELON", "CARROT", "CASEIN", "CASHEWNUT", "CAULIFLOWER", "CELERY"     Note: Lab results reviewed.  PFSH  Drug: Barbara Pennington  reports no history of drug use. Alcohol:  reports current alcohol use. Tobacco:  reports that she quit smoking about 8 years ago. Her smoking use included cigarettes. She started smoking about 18 years ago. She has a 2.5 pack-year smoking history. She has never used smokeless tobacco. Medical:  has a past medical history of Anxiety, Arthritis (2006), Asthma (2003), Borderline personality disorder (HCC), Chronic kidney disease, Complication of anesthesia, Depression, GERD (gastroesophageal reflux disease), History of kidney stones,  Hyperlipidemia, Hypertension (2013), Lump or mass in breast (2013), Motion sickness, PTSD (post-traumatic stress disorder), Scoliosis, Shortness of breath dyspnea, Sleep apnea, and Ulcer (2001). Family: family history includes Alzheimer's disease in her paternal grandmother; Aneurysm in her maternal grandmother; Anxiety disorder in her father and mother; Asthma in her mother; Breast cancer in her maternal grandmother; Colon cancer in her paternal grandfather; Dementia in her paternal grandmother; Depression in her father and mother; Diabetes in her mother; Heart disease in her maternal grandfather; High Cholesterol in her father, maternal grandfather, and mother; Hypertension in her father, maternal grandfather, maternal grandmother, and mother; Mental illness in her maternal aunt; Neuropathy in her mother; Obesity in her father and mother; Seizures in her brother; Stroke in her maternal grandfather and maternal grandmother; Suicidality in her cousin; Testicular cancer in her brother.  Past Surgical History:  Procedure Laterality Date   ABDOMINAL HYSTERECTOMY  01/08/2006   BREAST BIOPSY Left 01/09/2011   CORE - FIBROADENOMA VS PHYLLODES   CESAREAN SECTION  01/08/2005   COLONOSCOPY WITH PROPOFOL  N/A 10/09/2016   Procedure: COLONOSCOPY WITH PROPOFOL ;  Surgeon: Luke Salaam, MD;  Location: [facility] ENDOSCOPY;  Service: Gastroenterology;  Laterality: N/A;   COLONOSCOPY WITH PROPOFOL  N/A 11/09/2019   Procedure: COLONOSCOPY WITH PROPOFOL ;  Surgeon: Selena Daily, MD;  Location: [facility] ENDOSCOPY;  Service: Gastroenterology;  Laterality: N/A;   ESOPHAGOGASTRODUODENOSCOPY     GUM SURGERY  01/08/2002   KNEE ARTHROSCOPY WITH MEDIAL MENISECTOMY Right 07/24/2021   Procedure: Right medial meniscus root repair and patella chondroplasty;  Surgeon: Lorri Rota, MD;  Location: [facility] ORS;  Service: Orthopedics;  Laterality: Right;   PLANTAR FASCIA RELEASE Left 08/19/2014   Procedure: ENDOSCOPIC PLANTAR FASCIOTOMY  RELEASE L WITH TOPAZ;  Surgeon: Sharlyn Deaner, DPM;  Location: [facility];  Service: Podiatry;  Laterality: Left;  LMA WITH POPLITEAL BLOCK   TONSILLECTOMY  01/08/1982   Active Ambulatory Problems    Diagnosis Date Noted   Essential hypertension    Fibroadenoma of breast, 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   Low back pain 08/20/2017   Asthma, mild persistent 04/30/2018   PTSD (post-traumatic stress disorder) 01/14/2019   Borderline personality disorder (HCC) 01/14/2019   Anxiety 01/14/2019   Mixed hyperlipidemia 01/14/2019   Morbid obesity with BMI of 50.0-59.9, adult (HCC) 01/14/2019   OSA on CPAP 01/14/2019   CKD (chronic kidney disease) stage 3, GFR 30-59 ml/min (HCC) 06/11/2019   Moderate episode of recurrent major depressive disorder (HCC) 02/08/2020   Impaired fasting glucose 12/06/2020   Lymphedema 12/06/2020   Metabolic syndrome 03/24/2021   Internal derangement of left knee 04/03/2021   Arthritis of left knee 04/03/2021   GAD (generalized anxiety disorder) 05/05/2021   Depressive disorder 05/05/2021   History of borderline personality disorder 05/05/2021   History of alcohol abuse 05/05/2021  Closed nondisplaced longitudinal fracture of right patella 06/09/2021   Adjustment disorder with depressed mood 06/13/2021   Skin-picking disorder 12/19/2021   Chronic cough 02/13/2022   High risk medication use 12/17/2022   Chronic pain of left knee 05/09/2023   Status post medial meniscus repair (RIGHT) 05/09/2023   Resolved Ambulatory Problems    Diagnosis Date Noted   Mastitis, left, acute 12/20/2016   Essential hypertension 06/15/2021   Past Medical History:  Diagnosis Date   Arthritis 2006   Asthma 2003   Chronic kidney disease    Complication of anesthesia    Depression    GERD (gastroesophageal reflux disease)    History of kidney stones    Hyperlipidemia    Hypertension 2013   Lump or mass in breast 2013   Motion sickness    Scoliosis    Shortness of breath dyspnea     Sleep apnea    Ulcer 2001   Constitutional Exam  General appearance: Well nourished, well developed, and well hydrated. In no apparent acute distress Vitals:   05/09/23 0808  BP: 120/88  Pulse: 71  Resp: 16  Temp: (!) 97.2 F (36.2 C)  SpO2: 98%  Weight: (!) 336 lb (152.4 kg)  Height: 5\' 5"  (1.651 m)   BMI Assessment: Estimated body mass index is 55.91 kg/m as calculated from the following:   Height as of this encounter: 5\' 5"  (1.651 m).   Weight as of this encounter: 336 lb (152.4 kg).  BMI interpretation table: BMI level Category Range association with higher incidence of chronic pain  <18 kg/m2 Underweight   18.5-24.9 kg/m2 Ideal body weight   25-29.9 kg/m2 Overweight Increased incidence by 20%  30-34.9 kg/m2 Obese (Class I) Increased incidence by 68%  35-39.9 kg/m2 Severe obesity (Class II) Increased incidence by 136%  >40 kg/m2 Extreme obesity (Class III) Increased incidence by 254%   Patient's current BMI Ideal Body weight  Body mass index is 55.91 kg/m. Ideal body weight: 57 kg (125 lb 10.6 oz) Adjusted ideal body weight: 95.2 kg (209 lb 12.8 oz)   BMI Readings from Last 4 Encounters:  05/09/23 55.91 kg/m  05/08/23 56.03 kg/m  03/04/23 53.22 kg/m  02/18/23 53.58 kg/m   Wt Readings from Last 4 Encounters:  05/09/23 (!) 336 lb (152.4 kg)  05/08/23 (!) 336 lb 11.2 oz (152.7 kg)  03/04/23 (!) 319 lb 12.8 oz (145.1 kg)  02/18/23 (!) 322 lb (146.1 kg)    Psych/Mental status: Alert, oriented x 3 (person, place, & time)       Eyes: PERLA Respiratory: No evidence of acute respiratory distress  Left knee pain, worse with weight bearing   Left knee xray mild-moderate degenerative changes to the medial compartment.   Assessment  Primary Diagnosis & Pertinent Problem List: The primary encounter diagnosis was Arthritis of left knee. Diagnoses of Chronic pain of left knee and Status post medial meniscus repair (RIGHT) were also pertinent to this  visit.  Visit Diagnosis (New problems to examiner): 1. Arthritis of left knee   2. Chronic pain of left knee   3. Status post medial meniscus repair (RIGHT)    Plan of Care (Initial workup plan)  Note: Ms. Trafton was reminded that as per protocol, today's visit has been an evaluation only. We have not taken over the patient's controlled substance management.  Problem-specific plan: Assessment and Plan    Left knee pain due to osteoarthritis   Chronic left knee pain from osteoarthritis persists despite previous cortisone and  gel injections. No prior surgery on the left knee. A genicular nerve block is under consideration to alleviate pain by blocking nerve signals around the knee. This involves anesthetic application at three points around the genicular nerve. Success may lead to genicular nerve ablation for extended relief lasting six to twelve months. Benefits and risks, including nerve regeneration and repeat ablation, were discussed. Concerns about masking pain and potential injury were addressed, noting that other nerves will still transmit essential signals. A successful nerve block is defined as at least 50% pain relief. Schedule the left genicular nerve block with IV sedation, obtain insurance approval, and advise fasting for at least six hours before the procedure. Ensure she has a driver. Consider genicular nerve ablation if the nerve block provides the desired relief. Communicate with Dr. Lydia Sams regarding imaging if the nerve block is ineffective.  Right knee meniscus surgery   Right knee meniscus surgery was performed by Dr. Lydia Sams. No current issues with the right knee.       Procedure Orders         GENICULAR NERVE BLOCK    Provider-requested follow-up: Return in about 2 weeks (around 05/23/2023) for Left GNB, in clinic IV Versed .  Future Appointments  Date Time Provider Department Center  05/13/2023  4:15 PM Harris Liming, MD [facility] None  05/21/2023  4:00 PM Len Quale, [facility] None  05/27/2023  4:00 PM Vergia Glasgow, MD [facility] None  05/31/2023  3:00 PM Arleen Lacer, MD [facility]  06/05/2023  4:30 PM Eappen, Saramma, MD [facility] None  06/06/2023  4:00 PM Len Quale, [facility] None  06/20/2023  4:00 PM Len Quale, [facility] None  11/15/2023  3:20 PM Sowles, Krichna, MD [facility]   I discussed the assessment and treatment plan with the patient. The patient was provided an opportunity to ask questions and all were answered. The patient agreed with the plan and demonstrated an understanding of the instructions.  Patient advised to call back or seek an in-person evaluation if the symptoms or condition worsens.  Duration of encounter: .  Total time on encounter, as per AMA guidelines included both the face-to-face and non-face-to-face time personally spent by the physician and/or other qualified health care professional(s) on the day of the encounter (includes time in activities that require the physician or other qualified health care professional and does not include time in activities normally performed by clinical staff). Physician's time may include the following activities when performed: Preparing to see the patient (e.g., pre-charting review of records, searching for previously ordered imaging, lab work, and nerve conduction tests) Review of prior analgesic pharmacotherapies. Reviewing PMP Interpreting ordered tests (e.g., lab work, imaging, nerve conduction tests) Performing post-procedure evaluations, including interpretation of diagnostic procedures Obtaining and/or reviewing separately obtained history Performing a medically appropriate examination and/or evaluation Counseling and educating the patient/family/caregiver Ordering medications, tests, or procedures Referring and communicating with other health care professionals (when not separately reported) Documenting clinical  information in the electronic or other health record Independently interpreting results (not separately reported) and communicating results to the patient/ family/caregiver Care coordination (not separately reported)  Note by: Cephus Collin, MD (TTS and AI technology used. I apologize for any typographical errors that were not detected and corrected.) Date: 05/09/2023; Time: 9:00 AM

## 2023-05-09 NOTE — Progress Notes (Signed)
 Safety precautions to be maintained throughout the outpatient stay will include: orient to surroundings, keep bed in low position, maintain call bell within reach at all times, provide assistance with transfer out of bed and ambulation.

## 2023-05-09 NOTE — Patient Instructions (Signed)
   ______________________________________________________________________    Procedure instructions  Stop blood-thinners  Do not eat or drink fluids (other than water) for 6 hours before your procedure  No water for 2 hours before your procedure  Take your blood pressure medicine with a sip of water  Arrive 30 minutes before your appointment  If sedation is planned, bring suitable driver. Pennie Banter, Benedetto Goad, & public transportation are NOT APPROVED)  Carefully read the "Preparing for your procedure" detailed instructions  If you have questions call us at [phone]  Procedure appointments are for procedures only. NO medication refills or new problem evaluations.   ______________________________________________________________________

## 2023-05-13 ENCOUNTER — Ambulatory Visit: Admitting: Dermatology

## 2023-05-13 ENCOUNTER — Encounter: Payer: Self-pay | Admitting: Dermatology

## 2023-05-13 DIAGNOSIS — L980 Pyogenic granuloma: Secondary | ICD-10-CM | POA: Diagnosis not present

## 2023-05-13 NOTE — Progress Notes (Signed)
   New Patient Visit   Subjective  Barbara Pennington is a 43 y.o. female who presents for the following: spot at right arm, bleeds, present for about 1 month. Referral from Dr. Ava Lei.  The patient has spots, moles and lesions to be evaluated, some may be new or changing and the patient may have concern these could be cancer.   The following portions of the chart were reviewed this encounter and updated as appropriate: medications, allergies, medical history  Review of Systems:  No other skin or systemic complaints except as noted in HPI or Assessment and Plan.  Objective  Well appearing patient in no apparent distress; mood and affect are within normal limits.   A focused examination was performed of the following areas: Right arm  Relevant exam findings are noted in the Assessment and Plan.  Right Upper Posterior Arm 1.0 cm red vascular papulo nodule   Assessment & Plan     PYOGENIC GRANULOMA Right Upper Posterior Arm Epidermal / dermal shaving - Right Upper Posterior Arm  Lesion diameter (cm):  1 Informed consent: discussed and consent obtained   Timeout: patient name, date of birth, surgical site, and procedure verified   Procedure prep:  Patient was prepped and draped in usual sterile fashion Prep type:  Povidone-iodine and isopropyl alcohol Anesthesia: the lesion was anesthetized in a standard fashion   Anesthetic:  1% lidocaine  w/ epinephrine  1-100,000 buffered w/ 8.4% NaHCO3 Instrument used: DermaBlade   Hemostasis achieved with: pressure, aluminum chloride and electrodesiccation   Outcome: patient tolerated procedure well   Post-procedure details: wound care instructions given   Specimen 1 - Surgical pathology Differential Diagnosis: pyogenic granuloma  Check Margins: No  Return if symptoms worsen or fail to improve.  Kerstin Peeling, RMA, am acting as scribe for Harris Liming, MD .   Documentation: I have reviewed the above documentation for  accuracy and completeness, and I agree with the above.  Harris Liming, MD

## 2023-05-17 ENCOUNTER — Ambulatory Visit: Admitting: Professional Counselor

## 2023-05-20 ENCOUNTER — Encounter: Payer: Self-pay | Admitting: Dermatology

## 2023-05-20 LAB — SURGICAL PATHOLOGY

## 2023-05-21 ENCOUNTER — Ambulatory Visit (INDEPENDENT_AMBULATORY_CARE_PROVIDER_SITE_OTHER): Admitting: Professional Counselor

## 2023-05-21 ENCOUNTER — Ambulatory Visit: Payer: Self-pay

## 2023-05-21 DIAGNOSIS — F411 Generalized anxiety disorder: Secondary | ICD-10-CM

## 2023-05-21 NOTE — Progress Notes (Unsigned)
 THERAPIST PROGRESS NOTE  Virtual Visit via Video Note  I connected with Barbara Pennington on 05/22/23 at  4:00 PM EDT by a video enabled telemedicine application and verified that I am speaking with the correct person using two identifiers.  Location: Patient: Home Provider: Office   I discussed the limitations of evaluation and management by telemedicine and the availability of in person appointments. The patient expressed understanding and agreed to proceed.   I discussed the assessment and treatment plan with the patient. The patient was provided an opportunity to ask questions and all were answered. The patient agreed with the plan and demonstrated an understanding of the instructions.   The patient was advised to call back or seek an in-person evaluation if the symptoms worsen or if the condition fails to improve as anticipated.  I provided 51 minutes of non-face-to-face time during this encounter. Len Quale, [facility]  Session Time: 4:01 PM - 4:52 PM   Participation Level: Active  Behavioral Response: Well Groomed, Alert, Euthymic  Type of Therapy: Individual Therapy  Treatment Goals addressed: Active Anxiety  LTG: "I guess working through all the issues from the past. Learning how to better cope with stress."                Start:  02/05/23   End:  02/04/24   STG: "I don't deal with stress well. I get overwhelmed. I shut down. I get angry. I can't communicate my thoughts." To improve knowledge of DBT skills to reduce sxs per self report over the next 12 weeks.    ProgressTowards Goals: Progressing  Interventions: CBT, Motivational Interviewing, and Supportive  Summary: Barbara Pennington is a 43 y.o. female who presents with a history of anxiety, depression, BPD, and PTSD. She appeared alert and oriented x5. She has started working from home and will be doing this for 3 months. She reported she has changed her mind on the dog after seeing him be aggressive with other dogs  and towards children on one of their days out. Barbara Pennington engaged in discussion about boundaries and ways that she has attempted to set boundaries. She was receptive to feedback from Conservation officer, nature but tends to minimize her progress. Barbara Pennington expressed concerns she will become complacent if she acknowledges the small wins.  She actively listened to group orientation and plans to attend DBT Skills group this week.   Therapist Response: Conducted session with Barbara Pennington. Began session with check-in/update since previous session. Utilized empathetic and reflective listening. Used open-ended questions to facilitate discussion and summarized Barbara Pennington's thoughts/feelings. Engaged in discussion about boundaries and provided feedback. Praised Barbara Pennington for progress in awareness and thinking about potential responses even if she didn't say them. Normalized changing her mind about things, especially after processing situations and receiving more information to make a different decision. Reviewed Scheduled additional appointment and concluded session. Reviewed orientation module for DBT skills group.   Suicidal/Homicidal: No  Plan: Return again in 2 weeks.  Diagnosis: GAD (generalized anxiety disorder)  Collaboration of Care: Medication Management AEB chart review  Patient/Guardian was advised Release of Information must be obtained prior to any record release in order to collaborate their care with an outside provider. Patient/Guardian was advised if they have not already done so to contact the registration department to sign all necessary forms in order for us  to release information regarding their care.   Consent: Patient/Guardian gives verbal consent for treatment and assignment of benefits for services provided during this visit. Patient/Guardian expressed understanding  and agreed to proceed.   Len Quale, [facility] 05/22/2023

## 2023-05-23 ENCOUNTER — Ambulatory Visit: Payer: 59 | Admitting: Student in an Organized Health Care Education/Training Program

## 2023-05-24 ENCOUNTER — Ambulatory Visit (INDEPENDENT_AMBULATORY_CARE_PROVIDER_SITE_OTHER): Admitting: Professional Counselor

## 2023-05-24 DIAGNOSIS — F603 Borderline personality disorder: Secondary | ICD-10-CM | POA: Diagnosis not present

## 2023-05-24 NOTE — Progress Notes (Signed)
  THERAPIST PROGRESS NOTE  Virtual Visit via Video Note  I connected with Barbara Pennington on 05/24/23 at 12:00 PM EDT by a video enabled telemedicine application and verified that I am speaking with the correct person using two identifiers.  Location: Patient: Home Provider: Remote office   I discussed the limitations of evaluation and management by telemedicine and the availability of in person appointments. The patient expressed understanding and agreed to proceed.   I discussed the assessment and treatment plan with the patient. The patient was provided an opportunity to ask questions and all were answered. The patient agreed with the plan and demonstrated an understanding of the instructions.   The patient was advised to call back or seek an in-person evaluation if the symptoms worsen or if the condition fails to improve as anticipated.  I provided 53 minutes of non-face-to-face time during this encounter. Len Quale, [facility]  Session Time: 12:00 PM - 12:53 PM   Participation Level: Active  Behavioral Response: Casual, Alert, Euthymic  Type of Therapy: Group Therapy  Interventions: CBT and Supportive  Summary: [name] is a 43 y.o. female who presents with a history of anxiety, depression, skin-picking disorder, PTSD, and borderline personality disorder. She appeared alert and oriented x5 today. This was her first group session. She stated her week has been "very good" and she's enjoying her recent transition to working from home. She reported her goal for group is to gain support and encouragement to decrease her anxiety. Her goal for this week is to "think before I speak."   Therapist Response: Conducted telehealth group session. Began session with introductions and review of group rules and expectations. Engaged group members in mindfulness exercise - guided meditation for mindfulness. Conducted brief check-in with each group member. Reviewed handouts and worksheets -  Mindfulness module - Goals and WISE MIND. Passenger transport manager with each group member and goal setting between now and next session.   Suicidal/Homicidal: No  Diagnosis: Borderline personality disorder (HCC)  Patient/Guardian was advised Release of Information must be obtained prior to any record release in order to collaborate their care with an outside provider. Patient/Guardian was advised if they have not already done so to contact the registration department to sign all necessary forms in order for us  to release information regarding their care.   Consent: Patient/Guardian gives verbal consent for treatment and assignment of benefits for services provided during this visit. Patient/Guardian expressed understanding and agreed to proceed.   Len Quale, [facility] 05/24/2023

## 2023-05-27 ENCOUNTER — Ambulatory Visit: Admitting: Student in an Organized Health Care Education/Training Program

## 2023-05-27 ENCOUNTER — Ambulatory Visit
Admission: RE | Admit: 2023-05-27 | Discharge: 2023-05-27 | Disposition: A | Discharge status: Home / Self Care | Source: Ambulatory Visit | Attending: Student in an Organized Health Care Education/Training Program | Admitting: Student in an Organized Health Care Education/Training Program

## 2023-05-27 ENCOUNTER — Ambulatory Visit
Attending: Student in an Organized Health Care Education/Training Program | Admitting: Student in an Organized Health Care Education/Training Program

## 2023-05-27 ENCOUNTER — Encounter: Payer: Self-pay | Admitting: Student in an Organized Health Care Education/Training Program

## 2023-05-27 DIAGNOSIS — G8929 Other chronic pain: Secondary | ICD-10-CM | POA: Diagnosis present

## 2023-05-27 DIAGNOSIS — M25562 Pain in left knee: Secondary | ICD-10-CM | POA: Insufficient documentation

## 2023-05-27 DIAGNOSIS — M1712 Unilateral primary osteoarthritis, left knee: Secondary | ICD-10-CM | POA: Diagnosis present

## 2023-05-27 MED ORDER — ROPIVACAINE HCL 2 MG/ML IJ SOLN
9.0000 mL | Freq: Once | INTRAMUSCULAR | Status: AC
  Administered 2023-05-27: 9 mL via PERINEURAL
  Filled 2023-05-27: qty 20

## 2023-05-27 MED ORDER — DEXAMETHASONE SODIUM PHOSPHATE 10 MG/ML IJ SOLN
10.0000 mg | Freq: Once | INTRAMUSCULAR | Status: AC
  Administered 2023-05-27: 10 mg
  Filled 2023-05-27: qty 1

## 2023-05-27 MED ORDER — LIDOCAINE HCL 2 % IJ SOLN
20.0000 mL | Freq: Once | INTRAMUSCULAR | Status: AC
  Administered 2023-05-27: 400 mg
  Filled 2023-05-27: qty 40

## 2023-05-27 MED ORDER — LACTATED RINGERS IV SOLN: Freq: Once | INTRAVENOUS | Status: AC

## 2023-05-27 MED ORDER — MIDAZOLAM HCL 2 MG/2ML IJ SOLN
0.5000 mg | Freq: Once | INTRAMUSCULAR | Status: AC
  Administered 2023-05-27: 2 mg via INTRAVENOUS
  Filled 2023-05-27: qty 2

## 2023-05-27 NOTE — Patient Instructions (Signed)

## 2023-05-27 NOTE — Progress Notes (Signed)
 PROVIDER NOTE: Interpretation of information contained herein should be left to medically-trained personnel. Specific patient instructions are provided elsewhere under "Patient Instructions" section of medical record. This document was created in part using STT-dictation technology, any transcriptional errors that may result from this process are unintentional.  Patient: Barbara Pennington Type: Established DOB: 1980/01/19 MRN: [id number] PCP: Sowles, Krichna, MD  Service: Procedure DOS: 05/27/2023 Setting: Ambulatory Location: Ambulatory outpatient facility Delivery: Face-to-face Provider: Cephus Collin, MD Specialty: Interventional Pain Management Specialty designation: 09 Location: Outpatient facility Ref. Prov.: Cephus Collin, MD       Interventional Therapy   Procedure:                Type: Genicular Nerves Block (Superolateral, Superomedial, and Inferomedial Genicular Nerves)  #1  Laterality: Left (-LT)  Level: Superior and inferior to the knee joint.  Imaging: Fluoroscopic guidance Anesthesia: Local anesthesia (1-2% Lidocaine ) Sedation: Minimal Sedation                       DOS: 05/27/2023  Performed by: Cephus Collin, MD  Purpose: Diagnostic/Therapeutic Indications: Chronic knee pain severe enough to impact quality of life or function. Rationale (medical necessity): procedure needed and proper for the diagnosis and/or treatment of Barbara Pennington's medical symptoms and needs. 1. Arthritis of left knee   2. Chronic pain of left knee    NAS-11 Pain score:   Pre-procedure: 3 /10   Post-procedure: 3 /10     Target: For Genicular Nerve block(s), the targets are: the superolateral genicular nerve, located in the lateral distal portion of the femoral shaft as it curves to form the lateral epicondyle, in the region of the distal femoral metaphysis; the superomedial genicular nerve, located in the medial distal portion of the femoral shaft as it curves to form the medial epicondyle; and the  inferomedial genicular nerve, located in the medial, proximal portion of the tibial shaft, as it curves to form the medial epicondyle, in the region of the proximal tibial metaphysis.  Location: Superolateral, Superomedial, and Inferomedial aspects of knee joint.  Region: Lateral, Anterior, and Medial aspects of the knee joint, above and below the knee joint proper. Approach: Percutaneous  Type of procedure: Percutaneous perineural nerve block. The genicular nerve block is a motor-sparing technique that anesthetizes the sensory terminal branches innervating the knee joint, resulting in anesthesia of the anterior compartment of the knee. The distribution of anesthesia of each nerve is mostly in the corresponding quadrant.  Neuroanatomy: The superolateral genicular nerve (SLGN) courses around the femur shaft to pass between the vastus lateralis and the lateral epicondyle. It accompanies the superior lateral genicular artery. The superomedial genicular nerve (SMGN) courses around the femur shaft, following the superior medial genicular artery, to pass between the adductor magnus tendon and the medial epicondyle below the vastus medialis. The inferolateral genicular nerve (ILGN) courses around the tibial lateral epicondyle deep to the lateral collateral ligament, following the inferior lateral genicular artery, superior of the fibula head. The inferomedial genicular nerve (IMGN) courses horizontally below the medial collateral ligament between the tibial medial epicondyle and the insertion of the collateral ligament. It accompanies the inferior medial genicular artery. The recurrent peroneal nerve originates in the inferior popliteal region from the common peroneal nerve and courses horizontally around the fibula to pass just inferior of the fibula head and travel superior to the anterolateral tibial epicondyle. It accompanies the recurrent tibial artery.  Position / Prep / Materials:  Position: Supine,  Modified Fowler's position  with pillows under the targeted knee(s). The patient is placed in a supine position with the knee slightly flexed by placing a pillow in the popliteal fossa. Prep solution: ChloraPrep (2% chlorhexidine  gluconate and 70% isopropyl alcohol) Prep Area: Entire knee area, from mid-thigh to mid-shin, lateral, anterior, and medial aspects. Materials:  Tray: Block Needle(s):  Type: Spinal  Gauge (G): 22  Length: 3.5-in  Qty: 3  H&P (Pre-op Assessment):  Barbara Pennington is a 43 y.o. (year old), female patient, seen today for interventional treatment. She  has a past surgical history that includes Tonsillectomy (01/08/1982); Cesarean section (01/08/2005); Gum surgery (01/08/2002); Abdominal hysterectomy (01/08/2006); Plantar fascia release (Left, 08/19/2014); Breast biopsy (Left, 01/09/2011); Colonoscopy with propofol  (N/A, 10/09/2016); Colonoscopy with propofol  (N/A, 11/09/2019); Esophagogastroduodenoscopy; and Knee arthroscopy with medial menisectomy (Right, 07/24/2021). Barbara Pennington has a current medication list which includes the following prescription(s): acetaminophen , airsupra , amlodipine , atorvastatin , budesonide -formoterol , bupropion , buspirone , desloratadine , diclofenac , escitalopram , fluticasone , ibuprofen , melatonin, modafinil , montelukast , valsartan -hydrochlorothiazide , clonazepam , and gabapentin , and the following Facility-Administered Medications: lactated ringers . Her primarily concern today is the Knee Pain  Initial Vital Signs:  Pulse/HCG Rate: 64ECG Heart Rate: 66 Temp: 97.7 F (36.5 C) Resp: 16 BP: (!) 140/91 SpO2: 96 %  BMI: Estimated body mass index is 57.24 kg/m as calculated from the following:   Height as of this encounter: 5\' 5"  (1.651 m).   Weight as of this encounter: 344 lb (156 kg).  Risk Assessment: Allergies: Reviewed. She is allergic to wound dressing adhesive, latex, and metformin and related.  Allergy Precautions: None  required Coagulopathies: Reviewed. None identified.  Blood-thinner therapy: None at this time Active Infection(s): Reviewed. None identified. Barbara Pennington is afebrile  Site Confirmation: Ms. Chiem was asked to confirm the procedure and laterality before marking the site Procedure checklist: Completed Consent: Before the procedure and under the influence of no sedative(s), amnesic(s), or anxiolytics, the patient was informed of the treatment options, risks and possible complications. To fulfill our ethical and legal obligations, as recommended by the American Medical Association's Code of Ethics, I have informed the patient of my clinical impression; the nature and purpose of the treatment or procedure; the risks, benefits, and possible complications of the intervention; the alternatives, including doing nothing; the risk(s) and benefit(s) of the alternative treatment(s) or procedure(s); and the risk(s) and benefit(s) of doing nothing. The patient was provided information about the general risks and possible complications associated with the procedure. These may include, but are not limited to: failure to achieve desired goals, infection, bleeding, organ or nerve damage, allergic reactions, paralysis, and death. In addition, the patient was informed of those risks and complications associated to the procedure, such as failure to decrease pain; infection; bleeding; organ or nerve damage with subsequent damage to sensory, motor, and/or autonomic systems, resulting in permanent pain, numbness, and/or weakness of one or several areas of the body; allergic reactions; (i.e.: anaphylactic reaction); and/or death. Furthermore, the patient was informed of those risks and complications associated with the medications. These include, but are not limited to: allergic reactions (i.e.: anaphylactic or anaphylactoid reaction(s)); adrenal axis suppression; blood sugar elevation that in diabetics may result in ketoacidosis  or comma; water retention that in patients with history of congestive heart failure may result in shortness of breath, pulmonary edema, and decompensation with resultant heart failure; weight gain; swelling or edema; medication-induced neural toxicity; particulate matter embolism and blood vessel occlusion with resultant organ, and/or nervous system infarction; and/or aseptic necrosis of one or more joints. Finally, the patient was  informed that Medicine is not an exact science; therefore, there is also the possibility of unforeseen or unpredictable risks and/or possible complications that may result in a catastrophic outcome. The patient indicated having understood very clearly. We have given the patient no guarantees and we have made no promises. Enough time was given to the patient to ask questions, all of which were answered to the patient's satisfaction. Ms. Norlander has indicated that she wanted to continue with the procedure. Attestation: I, the ordering provider, attest that I have discussed with the patient the benefits, risks, side-effects, alternatives, likelihood of achieving goals, and potential problems during recovery for the procedure that I have provided informed consent. Date  Time: 05/27/2023 12:40 PM  Pre-Procedure Preparation:  Monitoring: As per clinic protocol. Respiration, ETCO2, SpO2, BP, heart rate and rhythm monitor placed and checked for adequate function Safety Precautions: Patient was assessed for positional comfort and pressure points before starting the procedure. Time-out: I initiated and conducted the "Time-out" before starting the procedure, as per protocol. The patient was asked to participate by confirming the accuracy of the "Time Out" information. Verification of the correct person, site, and procedure were performed and confirmed by me, the nursing staff, and the patient. "Time-out" conducted as per Joint Commission's Universal Protocol (UP.01.01.01). Time: 1331 Start  Time: 1331 hrs.  Description/Narrative of Procedure:          Rationale (medical necessity): procedure needed and proper for the diagnosis and/or treatment of the patient's medical symptoms and needs. Procedural Technique Safety Precautions: Aspiration looking for blood return was conducted prior to all injections. At no point did we inject any substances, as a needle was being advanced. No attempts were made at seeking any paresthesias. Safe injection practices and needle disposal techniques used. Medications properly checked for expiration dates. SDV (single dose vial) medications used. Description of the Procedure: Protocol guidelines were followed. The patient was assisted into a comfortable position. The target area was identified and the area prepped in the usual manner. Skin & deeper tissues infiltrated with local anesthetic. Appropriate amount of time allowed to pass for local anesthetics to take effect. The procedure needles were then advanced to the target area. Proper needle placement secured. Negative aspiration confirmed. Solution injected in intermittent fashion, asking for systemic symptoms every 0.5cc of injectate. The needles were then removed and the area cleansed, making sure to leave some of the prepping solution back to take advantage of its long term bactericidal properties.  3 cc of nerve block solution injected at each of the genicular nerves (left knee)             Vitals:   05/27/23 1252 05/27/23 1330 05/27/23 1335 05/27/23 1341  BP: (!) 140/91 (!) 145/86 (!) 144/80 (!) 156/92  Pulse: 64     Resp: 16 18 17 16   Temp: 97.7 F (36.5 C)     TempSrc: Temporal     SpO2: 96% 99% 98% 97%  Weight: (!) 344 lb (156 kg)     Height: 5\' 5"  (1.651 m)        Start Time: 1331 hrs. End Time: 1335 hrs.  Imaging Guidance (Non-Spinal):          Type of Imaging Technique: Fluoroscopy Guidance (Non-Spinal) Indication(s): Fluoroscopy guidance for needle placement to enhance accuracy  in procedures requiring precise needle localization for targeted delivery of medication in or near specific anatomical locations not easily accessible without such real-time imaging assistance. Exposure Time: Please see nurses notes. Contrast: None used.  Fluoroscopic Guidance: I was personally present during the use of fluoroscopy. "Tunnel Vision Technique" used to obtain the best possible view of the target area. Parallax error corrected before commencing the procedure. "Direction-depth-direction" technique used to introduce the needle under continuous pulsed fluoroscopy. Once target was reached, antero-posterior, oblique, and lateral fluoroscopic projection used confirm needle placement in all planes. Images permanently stored in EMR. Interpretation: No contrast injected. I personally interpreted the imaging intraoperatively. Adequate needle placement confirmed in multiple planes. Permanent images saved into the patient's record.  Post-operative Assessment:  Post-procedure Vital Signs:  Pulse/HCG Rate: 6468 Temp: 97.7 F (36.5 C) Resp: 16 BP: (!) 156/92 SpO2: 97 %  EBL: None  Complications: No immediate post-treatment complications observed by team, or reported by patient.  Note: The patient tolerated the entire procedure well. A repeat set of vitals were taken after the procedure and the patient was kept under observation following institutional policy, for this type of procedure. Post-procedural neurological assessment was performed, showing return to baseline, prior to discharge. The patient was provided with post-procedure discharge instructions, including a section on how to identify potential problems. Should any problems arise concerning this procedure, the patient was given instructions to immediately contact us , at any time, without hesitation. In any case, we plan to contact the patient by telephone for a follow-up status report regarding this interventional procedure.  Comments:  No  additional relevant information.  Plan of Care (POC)  Orders:  Orders Placed This Encounter  Procedures   DG PAIN CLIN[facility] C-ARM 1-60 MIN NO REPORT    Intraoperative interpretation by procedural physician at [facility].    Standing Status:   Standing    Number of Occurrences:   1    Reason for exam::   Assistance in needle guidance and placement for procedures requiring needle placement in or near specific anatomical locations not easily accessible without such assistance.     Medications ordered for procedure: Meds ordered this encounter  Medications   lidocaine  (XYLOCAINE ) 2 % (with pres) injection 400 mg   midazolam  (VERSED ) injection 0.5-2 mg    Make sure Flumazenil is available in the pyxis when using this medication. If oversedation occurs, administer 0.2 mg IV over 15 sec. If after 45 sec no response, administer 0.2 mg again over 1 min; may repeat at 1 min intervals; not to exceed 4 doses (1 mg)   lactated ringers  infusion   ropivacaine  (PF) 2 mg/mL (0.2%) (NAROPIN ) injection 9 mL   dexamethasone  (DECADRON ) injection 10 mg   Medications administered: We administered lidocaine , midazolam , lactated ringers , ropivacaine  (PF) 2 mg/mL (0.2%), and dexamethasone .  See the medical record for exact dosing, route, and time of administration.  Follow-up plan:   Return in about 4 weeks (around 06/24/2023) for PPE, F2F.       Left [facility] May 14, 1980    Recent Visits Date Type Provider Dept  05/09/23 Office Visit Cephus Collin, MD [facility]  Showing recent visits within past 90 days and meeting all other requirements Today's Visits Date Type Provider Dept  05/27/23 Procedure visit Cephus Collin, MD [facility]  Showing today's visits and meeting all other requirements Future Appointments Date Type Provider Dept  06/27/23 Appointment Cephus Collin, MD [facility]  Showing future appointments within next 90 days and meeting all other  requirements  Disposition: Discharge home  Discharge (Date  Time): 05/27/2023; 1350 hrs.   Primary Care Physician: Sowles, Krichna, MD Location: [facility] Note by: Ike Malady  [name] Celeste, MD (TTS technology used. I apologize for any typographical errors that were not detected and corrected.) Date: 05/27/2023; Time: 1:45 PM  Disclaimer:  Medicine is not an Visual merchandiser. The only guarantee in medicine is that nothing is guaranteed. It is important to note that the decision to proceed with this intervention was based on the information collected from the patient. The Data and conclusions were drawn from the patient's questionnaire, the interview, and the physical examination. Because the information was provided in large part by the patient, it cannot be guaranteed that it has not been purposely or unconsciously manipulated. Every effort has been made to obtain as much relevant data as possible for this evaluation. It is important to note that the conclusions that lead to this procedure are derived in large part from the available data. Always take into account that the treatment will also be dependent on availability of resources and existing treatment guidelines, considered by other Pain Management Practitioners as being common knowledge and practice, at the time of the intervention. For Medico-Legal purposes, it is also important to point out that variation in procedural techniques and pharmacological choices are the acceptable norm. The indications, contraindications, technique, and results of the above procedure should only be interpreted and judged by a Board-Certified Interventional Pain Specialist with extensive familiarity and expertise in the same exact procedure and technique.

## 2023-05-28 ENCOUNTER — Telehealth: Payer: Self-pay | Admitting: *Deleted

## 2023-05-28 NOTE — Telephone Encounter (Signed)
 Post procedure call; voicemail left

## 2023-05-31 ENCOUNTER — Ambulatory Visit (INDEPENDENT_AMBULATORY_CARE_PROVIDER_SITE_OTHER): Admitting: Professional Counselor

## 2023-05-31 ENCOUNTER — Encounter: Payer: Self-pay | Admitting: Family Medicine

## 2023-05-31 ENCOUNTER — Ambulatory Visit: Payer: 59 | Admitting: Family Medicine

## 2023-05-31 VITALS — BP 136/82 | HR 95 | Resp 16 | Ht 65.0 in | Wt 336.5 lb

## 2023-05-31 DIAGNOSIS — N1831 Chronic kidney disease, stage 3a: Secondary | ICD-10-CM | POA: Diagnosis not present

## 2023-05-31 DIAGNOSIS — R0681 Apnea, not elsewhere classified: Secondary | ICD-10-CM

## 2023-05-31 DIAGNOSIS — J4520 Mild intermittent asthma, uncomplicated: Secondary | ICD-10-CM | POA: Insufficient documentation

## 2023-05-31 DIAGNOSIS — I10 Essential (primary) hypertension: Secondary | ICD-10-CM | POA: Diagnosis not present

## 2023-05-31 DIAGNOSIS — J3089 Other allergic rhinitis: Secondary | ICD-10-CM

## 2023-05-31 DIAGNOSIS — E782 Mixed hyperlipidemia: Secondary | ICD-10-CM

## 2023-05-31 DIAGNOSIS — G4733 Obstructive sleep apnea (adult) (pediatric): Secondary | ICD-10-CM

## 2023-05-31 DIAGNOSIS — I471 Supraventricular tachycardia, unspecified: Secondary | ICD-10-CM | POA: Insufficient documentation

## 2023-05-31 DIAGNOSIS — F331 Major depressive disorder, recurrent, moderate: Secondary | ICD-10-CM

## 2023-05-31 DIAGNOSIS — Z6841 Body Mass Index (BMI) 40.0 and over, adult: Secondary | ICD-10-CM

## 2023-05-31 DIAGNOSIS — M1712 Unilateral primary osteoarthritis, left knee: Secondary | ICD-10-CM

## 2023-05-31 DIAGNOSIS — K219 Gastro-esophageal reflux disease without esophagitis: Secondary | ICD-10-CM

## 2023-05-31 DIAGNOSIS — F603 Borderline personality disorder: Secondary | ICD-10-CM | POA: Diagnosis not present

## 2023-05-31 DIAGNOSIS — J4530 Mild persistent asthma, uncomplicated: Secondary | ICD-10-CM

## 2023-05-31 MED ORDER — VALSARTAN-HYDROCHLOROTHIAZIDE 160-25 MG PO TABS: 1.0000 | ORAL_TABLET | Freq: Every day | ORAL | 0 refills | Status: DC

## 2023-05-31 MED ORDER — MONTELUKAST SODIUM 10 MG PO TABS
10.0000 mg | ORAL_TABLET | Freq: Every day | ORAL | 1 refills | Status: DC
Start: 2023-05-31 — End: 2023-11-23

## 2023-05-31 MED ORDER — SEMAGLUTIDE-WEIGHT MANAGEMENT 0.25 MG/0.5ML ~~LOC~~ SOAJ: 0.2500 mg | SUBCUTANEOUS | 0 refills | Status: AC

## 2023-05-31 MED ORDER — MODAFINIL 100 MG PO TABS: 100.0000 mg | ORAL_TABLET | Freq: Every day | ORAL | 0 refills | Status: DC

## 2023-05-31 MED ORDER — ATORVASTATIN CALCIUM 20 MG PO TABS: 20.0000 mg | ORAL_TABLET | ORAL | 1 refills | Status: AC

## 2023-05-31 MED ORDER — CELECOXIB 100 MG PO CAPS: 100.0000 mg | ORAL_CAPSULE | Freq: Two times a day (BID) | ORAL | 0 refills | Status: DC

## 2023-05-31 MED ORDER — FLUTICASONE PROPIONATE 50 MCG/ACT NA SUSP: 1.0000 | Freq: Every day | NASAL | 1 refills | Status: AC

## 2023-05-31 MED ORDER — AMLODIPINE BESYLATE 2.5 MG PO TABS: 2.5000 mg | ORAL_TABLET | Freq: Every day | ORAL | 1 refills | Status: DC

## 2023-05-31 NOTE — Progress Notes (Signed)
 Name: Barbara Pennington   MRN: [id number]    DOB: 1980-06-26   Date:05/31/2023       Progress Note  Subjective  Chief Complaint  Chief Complaint  Patient presents with   Medical Management of Chronic Issues   Discussed the use of AI scribe software for clinical note transcription with the patient, who gave verbal consent to proceed.  History of Present Illness Barbara Pennington is a 43 year old female with hypertension and obesity who presents for a follow-up visit.  Her blood pressure has been elevated at home with recent readings of 149/81, 133/89, 134/94, 154/81, and 144/84. She is currently taking amlodipine  2.5 mg and valsartan  HCTZ 160/12.5 mg without side effects. Her blood pressure was 122/80 during her last visit in February, but she has gained weight from 319 lbs to 336 lbs since then.  She has a history of obesity with weight fluctuations over the years. Her weight was 356 lbs in January 2004 after her first child, decreased due to depression in 2009, and increased again to 357 lbs in April 2024. She had been on Wegovy , which helped her weight trend down to 325 lbs, but she has since regained weight. She has tried various diets, including Atkins and Weight Watchers, with temporary success.  She has obstructive sleep apnea and uses a CPAP machine nightly. She also has a history of asthma, which was exacerbated by a recent pneumonia, leading to shortness of breath. No current coughing or wheezing.  She experiences osteoarthritis, particularly in her left knee, and recently underwent a genicular nerve block procedure, which has improved her ability to perform daily activities such as standing in the shower and getting up from the toilet.  She has a history of moderate recurrent major depression and has started group therapy, which she finds beneficial. She is taking Buspar , Wellbutrin , and Lexapro .  She has chronic kidney disease but is not currently seeing a nephrologist. She is taking  atorvastatin  20 mg and denies any side effects such as muscle pain.  She has a history of supraventricular tachycardia but has not experienced palpitations recently and is not on a beta blocker.    Patient Active Problem List   Diagnosis Date Noted   Chronic pain of left knee 05/09/2023   Status post medial meniscus repair (RIGHT) 05/09/2023   High risk medication use 12/17/2022   Chronic cough 02/13/2022   Skin-picking disorder 12/19/2021   Adjustment disorder with depressed mood 06/13/2021   Closed nondisplaced longitudinal fracture of right patella 06/09/2021   GAD (generalized anxiety disorder) 05/05/2021   Depressive disorder 05/05/2021   History of borderline personality disorder 05/05/2021   History of alcohol abuse 05/05/2021   Internal derangement of left knee 04/03/2021   Arthritis of left knee 04/03/2021   Metabolic syndrome 03/24/2021   Impaired fasting glucose 12/06/2020   Lymphedema 12/06/2020   Moderate episode of recurrent major depressive disorder (HCC) 02/08/2020   CKD (chronic kidney disease) stage 3, GFR 30-59 ml/min (HCC) 06/11/2019   PTSD (post-traumatic stress disorder) 01/14/2019   Borderline personality disorder (HCC) 01/14/2019   Anxiety 01/14/2019   Mixed hyperlipidemia 01/14/2019   Morbid obesity with BMI of 50.0-59.9, adult (HCC) 01/14/2019   OSA on CPAP 01/14/2019   Asthma, mild persistent 04/30/2018   Low back pain 08/20/2017   Fibroadenoma of breast, 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   Essential hypertension     Past Surgical History:  Procedure Laterality Date   ABDOMINAL HYSTERECTOMY  01/08/2006   BREAST BIOPSY Left  01/09/2011   CORE - FIBROADENOMA VS PHYLLODES   CESAREAN SECTION  01/08/2005   COLONOSCOPY WITH PROPOFOL  N/A 10/09/2016   Procedure: COLONOSCOPY WITH PROPOFOL ;  Surgeon: Therisa Bi, MD;  Location: [facility] ENDOSCOPY;  Service: Gastroenterology;  Laterality: N/A;   COLONOSCOPY WITH PROPOFOL  N/A 11/09/2019   Procedure: COLONOSCOPY WITH PROPOFOL ;   Surgeon: Unk Corinn Skiff, MD;  Location: [facility] ENDOSCOPY;  Service: Gastroenterology;  Laterality: N/A;   ESOPHAGOGASTRODUODENOSCOPY     GUM SURGERY  01/08/2002   KNEE ARTHROSCOPY WITH MEDIAL MENISECTOMY Right 07/24/2021   Procedure: Right medial meniscus root repair and patella chondroplasty;  Surgeon: Tobie Priest, MD;  Location: [facility] ORS;  Service: Orthopedics;  Laterality: Right;   PLANTAR FASCIA RELEASE Left 08/19/2014   Procedure: ENDOSCOPIC PLANTAR FASCIOTOMY RELEASE L WITH TOPAZ;  Surgeon: Donnice Cory, DPM;  Location: [facility];  Service: Podiatry;  Laterality: Left;  LMA WITH POPLITEAL BLOCK   TONSILLECTOMY  01/08/1982    Family History  Problem Relation Age of Onset   Obesity Mother    Diabetes Mother    High Cholesterol Mother    Hypertension Mother    Neuropathy Mother    Depression Mother    Anxiety disorder Mother    Asthma Mother    Obesity Father    Hypertension Father    High Cholesterol Father    Depression Father    Anxiety disorder Father    Seizures Brother    Testicular cancer Brother    Breast cancer Maternal Grandmother    Hypertension Maternal Grandmother    Aneurysm Maternal Grandmother    Stroke Maternal Grandmother    Hypertension Maternal Grandfather    High Cholesterol Maternal Grandfather    Heart disease Maternal Grandfather        has a pacemaker   Stroke Maternal Grandfather    Alzheimer's disease Paternal Grandmother    Dementia Paternal Grandmother    Colon cancer Paternal Grandfather    Mental illness Maternal Aunt    Suicidality Cousin     Social History   Tobacco Use   Smoking status: Former    Current packs/day: 0.00    Average packs/day: 0.3 packs/day for 10.0 years (2.5 ttl pk-yrs)    Types: Cigarettes    Start date: 07/04/2004    Quit date: 07/05/2014    Years since quitting: 8.9   Smokeless tobacco: Never  Substance Use Topics   Alcohol use: Yes    Comment: rare     Current Outpatient Medications:     acetaminophen  (TYLENOL ) 500 MG tablet, Take 1,000 mg by mouth every 6 (six) hours as needed for moderate pain (pain score 4-6)., Disp: , Rfl:    Albuterol -Budesonide  (AIRSUPRA ) 90-80 MCG/ACT AERO, Inhale 2 puffs into the lungs 4 (four) times daily as needed., Disp: 10.7 g, Rfl: 1   amLODipine  (NORVASC ) 2.5 MG tablet, Take 1 tablet (2.5 mg total) by mouth daily., Disp: 90 tablet, Rfl: 0   atorvastatin  (LIPITOR) 20 MG tablet, Take 1 tablet (20 mg total) by mouth every morning., Disp: 90 tablet, Rfl: 1   budesonide -formoterol  (SYMBICORT ) 160-4.5 MCG/ACT inhaler, Inhale 2 puffs into the lungs in the morning and at bedtime., Disp: 1 each, Rfl: 12   buPROPion  (WELLBUTRIN  XL) 150 MG 24 hr tablet, TAKE 1 TABLET (150 MG TOTAL) BY MOUTH DAILY WITH BREAKFAST. STOP WELLBUTRIN  XL 300 MG, Disp: 90 tablet, Rfl: 0   busPIRone  (BUSPAR ) 30 MG tablet, TAKE 1 TABLET BY MOUTH 2 TIMES DAILY., Disp: 180 tablet, Rfl: 1  desloratadine  (CLARINEX ) 5 MG tablet, Take 1 tablet (5 mg total) by mouth daily., Disp: 90 tablet, Rfl: 1   diclofenac  (VOLTAREN ) 75 MG EC tablet, Take 75 mg by mouth 2 (two) times daily., Disp: , Rfl:    escitalopram  (LEXAPRO ) 20 MG tablet, TAKE 1 TABLET BY MOUTH EVERY DAY, Disp: 90 tablet, Rfl: 1   fluticasone  (FLONASE ) 50 MCG/ACT nasal spray, Place 1 spray into both nostrils daily., Disp: 48 mL, Rfl: 1   ibuprofen  (ADVIL ) 200 MG tablet, Take 600 mg by mouth every 6 (six) hours as needed., Disp: , Rfl:    Melatonin 10 MG CAPS, Take 10-20 mg by mouth at bedtime., Disp: , Rfl:    modafinil  (PROVIGIL ) 100 MG tablet, Take 1 tablet (100 mg total) by mouth daily., Disp: 30 tablet, Rfl: 0   montelukast  (SINGULAIR ) 10 MG tablet, Take 1 tablet (10 mg total) by mouth at bedtime., Disp: 90 tablet, Rfl: 1   valsartan -hydrochlorothiazide  (DIOVAN -HCT) 160-12.5 MG tablet, TAKE 1 TABLET BY MOUTH EVERY DAY, Disp: 90 tablet, Rfl: 0   clonazePAM  (KLONOPIN ) 0.5 MG tablet, Take 1 tablet (0.5 mg total) by mouth daily as  needed for anxiety. Please limit use (Patient not taking: Reported on 02/18/2023), Disp: 15 tablet, Rfl: 0   gabapentin  (NEURONTIN ) 300 MG capsule, Take 1 capsule by mouth 3 (three) times daily., Disp: , Rfl:   Allergies  Allergen Reactions   Wound Dressing Adhesive    Latex Rash   Metformin And Related     diarrhea    I personally reviewed active problem list, medication list, allergies, family history with the patient/caregiver today.   ROS  Ten systems reviewed and is negative except as mentioned in HPI    Objective Physical Exam Constitutional: Patient appears well-developed and well-nourished. Obese  No distress.  HEENT: head atraumatic, normocephalic, pupils equal and reactive to light, neck supple Cardiovascular: Normal rate, regular rhythm and normal heart sounds.  No murmur heard. No BLE edema. Pulmonary/Chest: Effort normal and breath sounds normal. No respiratory distress. Abdominal: Soft.  There is no tenderness. Psychiatric: Patient has a normal mood and affect. behavior is normal. Judgment and thought content normal.    VITALS: BP- 136/82 MEASUREMENTS: Height- 5'5, Weight- 336. CONSTITUTIONAL: Patient appears well-developed and well-nourished. No distress. HEENT: Head atraumatic, normocephalic, neck supple. CARDIOVASCULAR: Normal rate, regular rhythm and normal heart sounds. No murmur heard. No BLE edema. PULMONARY: Effort normal and breath sounds normal. Lungs clear to auscultation bilaterally. No respiratory distress. ABDOMINAL: There is no tenderness or distention. MUSCULOSKELETAL: Normal gait. Without gross motor or sensory deficit. PSYCHIATRIC: Patient has a normal mood and affect. Behavior is normal. Judgment and thought content normal.  Vitals:   05/31/23 1458  BP: 136/82  Pulse: 95  Resp: 16  SpO2: 96%  Weight: (!) 336 lb 8 oz (152.6 kg)  Height: 5' 5 (1.651 m)    Body mass index is 56 kg/m.  Recent Results (from the past 2160 hours)   Surgical pathology     Status: None   Collection Time: 05/13/23 12:00 AM  Result Value Ref Range   SURGICAL PATHOLOGY      SURGICAL PATHOLOGY [facility] [address], [address] [place], [place] [address] Telephone [phone] or [phone] Fax [phone]  REPORT OF DERMATOPATHOLOGY   Accession #: [id number] Patient Name: JAELIN, DEVINCENTIS Visit # : [id number]  MRN: [id number] Cytotechnologist: Ephriam Rolla Edelman, Dermatopathologist, Electronic Signature DOB/Age January 02, 1981 (Age: 24) Gender: F Collected Date: 05/13/2023 Received Date:  05/14/2023  FINAL DIAGNOSIS       1. Skin (M), right upper posterior arm :       EXCISION, PYOGENIC GRANULOMA, ULCERATED       DATE SIGNED OUT: 05/20/2023 ELECTRONIC SIGNATURE : Depcik-Smith Md, Natalie, Dermatopathologist, Electronic Signature  MICROSCOPIC DESCRIPTION 1. There is a lobular proliferation of vascular spaces in the dermis resembling exuberant granulation tissue.  Some of the endothelial cells are plump, but pleomorphism and atypia are not present.  These features correlate with a pyogenic granuloma.  Th ere is ulceration of the epidermis and an inflammatory infiltrate in the ulcer base.  CASE COMMENTS STAINS USED IN DIAGNOSIS: H&E H&E    CLINICAL HISTORY  SPECIMEN(S) OBTAINED 1. Skin (M), Right Upper Posterior Arm  SPECIMEN COMMENTS: 1. 1.0 cm red vascular papule nodule SPECIMEN CLINICAL INFORMATION: 1. Neoplasm of uncertain behavior of skin, pyogenic granuloma    Gross Description 1. Formalin fixed specimen received:  12 X 8 X 8 MM, TOTO (6 P) (2 B) ( ew ) Specimen may not survive processing.      a=3      b=3        Report signed out from the following location(s) [facility] [address] [place], [place] [address] CLIA #: [id number]  [facility] [address] [place], [place] [address] CLIA #: [id number]      PHQ2/9:     05/27/2023   12:56 PM 05/09/2023    8:09 AM 05/08/2023    1:14 PM 01/24/2023    8:29 AM 01/15/2023   10:25 AM  Depression screen PHQ 2/9  Decreased Interest 0 0 0 0 0  Down, Depressed, Hopeless 0 0 0 0 0  PHQ - 2 Score 0 0 0 0 0  Altered sleeping   0 0 0  Tired, decreased energy   0 0 0  Change in appetite   0 0 0  Feeling bad or failure about yourself    0 0 0  Trouble concentrating   0 0 0  Moving slowly or fidgety/restless   0 0 0  Suicidal thoughts   0 0 0  PHQ-9 Score   0 0 0  Difficult doing work/chores   Not difficult at all Not difficult at all Not difficult at all    phq 9 is negative  Fall Risk:    05/31/2023    2:56 PM 05/27/2023   12:56 PM 05/09/2023    8:09 AM 01/24/2023    8:28 AM 01/15/2023   10:25 AM  Fall Risk   Falls in the past year? 0 0 1 1 1   Number falls in past yr: 0  1 0 0  Injury with Fall? 0  0 1 0  Risk for fall due to : No Fall Risks   Impaired balance/gait Impaired balance/gait  Follow up Falls prevention discussed;Education provided;Falls evaluation completed   Falls prevention discussed;Falls evaluation completed;Education provided Education provided;Falls evaluation completed;Falls prevention discussed     Assessment and Plan Assessment & Plan Hypertension Blood pressure suboptimal, possibly due to weight gain and medication access issues. Discussed increasing diuretic component of valsartan  HCTZ. - Increase valsartan  HCTZ to 160/25 mg. - Continue amlodipine  2.5 mg. - Advise reducing sodium intake. - Monitor blood pressure at home and report elevated readings.  Obesity Weight increased to 336 lbs. BMI over 40 with comorbidities. Previous Wegovy  effective but insurance issues present. Discussed documenting weight loss for insurance coverage. - Resubmit prescription for  Wegovy  0.25 mg and document weight loss progress. - Encourage dietary changes, increased fruits and vegetables, reduced carbohydrates. - Advise on importance of physical  activity.  Obstructive Sleep Apnea Uses CPAP nightly without issues. Contributes to hypertension and obesity. - Continue CPAP use nightly.  Chronic Kidney Disease, unspecified Chronic kidney disease with fluctuating GFR. Blood pressure management crucial for kidney health. - Monitor kidney function with regular blood tests. - Adjust antihypertensive medications as needed.  Lymphedema Lymphedema with recent significant swelling. Weight loss advised. Diuretics ineffective. - Advise weight loss. - Consider referral to vascular surgeon if symptoms persist after weight loss.  Osteoarthritis Recent genicular nerve block improved mobility and reduced pain. Discussed non-surgical interventions. - Prescribe Celebrex  for pain management, up to twice daily as needed. - Discontinue diclofenac , use ibuprofen  as needed. - Encourage follow-up with pain management specialist as needed.  Asthma Well-managed with no current complications. Uses Symbicort  as needed. - Continue Symbicort  as needed. - Continue montelukast .  Moderate Recurrent Major Depression Under psychiatric care, started group therapy. Current medications include Buspar , Wellbutrin , and Lexapro . - Continue current psychiatric medications. - Continue group therapy sessions.  Hyperlipidemia Well-controlled with atorvastatin . Recent LDL improved from 171 to 86. - Continue atorvastatin  20 mg daily. - Reassess lipid levels with blood work in October.  Allergic Rhinitis Managed with Flonase . No current issues reported. - Continue Flonase  as needed.

## 2023-05-31 NOTE — Progress Notes (Signed)
  THERAPIST PROGRESS NOTE  Virtual Visit via Video Note  I connected with Barbara Pennington on 05/31/23 at 12:00 PM EDT by a video enabled telemedicine application and verified that I am speaking with the correct person using two identifiers.  Location: Patient: Home Provider: Remote office   I discussed the limitations of evaluation and management by telemedicine and the availability of in person appointments. The patient expressed understanding and agreed to proceed.   I discussed the assessment and treatment plan with the patient. The patient was provided an opportunity to ask questions and all were answered. The patient agreed with the plan and demonstrated an understanding of the instructions.   The patient was advised to call back or seek an in-person evaluation if the symptoms worsen or if the condition fails to improve as anticipated.  I provided 45 minutes of non-face-to-face time during this encounter. Len Quale, [facility]  Session Time: 12:00 PM - 12:45 PM   Participation Level: Active  Behavioral Response: Well Groomed, Alert, Euthymic  Type of Therapy: Group Therapy  Interventions: CBT and Supportive  Summary: Barbara Pennington is a 43 y.o. female who presents with a history of anxiety, depression, PTSD, Borderline personality disorder and skin picking disorder. She appeared alert and oriented x5. She stated her week has been okay; she continues to enjoy working from home. Barbara Pennington stated she needs to keep working on the goal set less week, but she would like to add "being more active."   Therapist Response: Conducted telehealth group session. Began session with introductions and review of group rules and expectations. Engaged group members in mindfulness exercise - guided meditation for "[employer] on a [employer] brief check-in with each group member. Reviewed handouts and worksheets - Mindfulness module - "What" skills - Observe, Describe, and Participate. Systems developer with each group member and goal setting between now and next session.   Suicidal/Homicidal: No  Diagnosis: Borderline personality disorder (HCC)  Patient/Guardian was advised Release of Information must be obtained prior to any record release in order to collaborate their care with an outside provider. Patient/Guardian was advised if they have not already done so to contact the registration department to sign all necessary forms in order for us  to release information regarding their care.   Consent: Patient/Guardian gives verbal consent for treatment and assignment of benefits for services provided during this visit. Patient/Guardian expressed understanding and agreed to proceed.   Len Quale, [facility] 05/31/2023

## 2023-06-05 ENCOUNTER — Telehealth: Admitting: Psychiatry

## 2023-06-05 ENCOUNTER — Encounter: Payer: Self-pay | Admitting: Psychiatry

## 2023-06-05 DIAGNOSIS — F1011 Alcohol abuse, in remission: Secondary | ICD-10-CM | POA: Diagnosis not present

## 2023-06-05 DIAGNOSIS — F411 Generalized anxiety disorder: Secondary | ICD-10-CM

## 2023-06-05 DIAGNOSIS — F424 Excoriation (skin-picking) disorder: Secondary | ICD-10-CM

## 2023-06-05 DIAGNOSIS — F4310 Post-traumatic stress disorder, unspecified: Secondary | ICD-10-CM | POA: Diagnosis not present

## 2023-06-05 DIAGNOSIS — Z8659 Personal history of other mental and behavioral disorders: Secondary | ICD-10-CM

## 2023-06-05 NOTE — Progress Notes (Signed)
 Virtual Visit via Video Note  I connected with Barbara Pennington on 06/05/23 at  4:30 PM EDT by a video enabled telemedicine application and verified that I am speaking with the correct person using two identifiers.  Location Provider Location : [facility] Patient Location : Home  Participants: Patient , Provider    I discussed the limitations of evaluation and management by telemedicine and the availability of in person appointments. The patient expressed understanding and agreed to proceed.   I discussed the assessment and treatment plan with the patient. The patient was provided an opportunity to ask questions and all were answered. The patient agreed with the plan and demonstrated an understanding of the instructions.   The patient was advised to call back or seek an in-person evaluation if the symptoms worsen or if the condition fails to improve as anticipated.    BH MD OP Progress Note  06/05/2023 5:07 PM Barbara Pennington  MRN:  [id number]  Chief Complaint:  Chief Complaint  Patient presents with   Follow-up   Depression   Anxiety   Medication Refill   Discussed the use of AI scribe software for clinical note transcription with the patient, who gave verbal consent to proceed.  History of Present Illness Barbara Pennington is a 43 year old Caucasian female, divorced, currently lives in [place], has a history of PTSD, anxiety, skin picking disorder, borderline personality disorder, morbid obesity, OSA on CPAP, hypertension/chronic kidney disease, asthma, chronic back pain, right sided knee injury was evaluated by telemedicine today.  Mood symptoms have been stable, with improvement attributed to participation in group therapy sessions led by her therapist Ms.Elizabeth Gainey. She has attended two sessions, missing the first due to work commitments. The sessions include guided meditation and discussions on emotional regulation and mindfulness techniques.  She continues to take her  prescribed medications, including Lexapro  20 mg daily, Buspar  30 mg twice a day, Wellbutrin  150 mg daily, and clonazepam  0.5 mg as needed, though she has not taken clonazepam  recently. She has restarted Wegovy  at a dose of 0.25 mg after resolving previous prescription approval issues.  She experiences issues with sleep, particularly after consuming large amounts of coffee, which negatively affects her sleep.  She was sleeping better until last night when she could not sleep until 3 AM since she drank a lot of coffee yesterday.  She struggles with skin picking, particularly on her legs. Attempts to use Band-Aids have resulted in allergic reactions to the adhesive, causing further skin irritation. She is managing skin dryness with lotion and exploring habit reversal techniques, such as using a tactile object to distract from picking.  She works remotely and reports work is going well. She enjoys crocheting, a hobby she learned from her mother and finds therapeutic.  She denies any suicidality, homicidality or perceptual disturbances.     Visit Diagnosis:    ICD-10-CM   1. PTSD (post-traumatic stress disorder)  F43.10     2. GAD (generalized anxiety disorder)  F41.1     3. 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 History of borderline personality disorder  Z86.59     5. History of alcohol abuse  F10.11       Past Psychiatric History: I have reviewed past psychiatric history from progress note on 05/05/2021.  Patient was previously under the care of [facility].  Past Medical History:  Past Medical History:  Diagnosis Date   Anxiety    Arthritis 2006   rhuematoid - mild -  no current issues   Asthma 2003   Borderline personality disorder (HCC)    Chronic kidney disease    stage 3   Complication of anesthesia    Usually has low temp after surgery.After hysterectomy temp was 94   Depression    GERD (gastroesophageal reflux disease)    History of kidney stones    Hyperlipidemia     Hypertension 2013   Lump or mass in breast 2013   RIGHT BREAST   Motion sickness    repeated amusement park rides   PTSD (post-traumatic stress disorder)    Scoliosis    no current issues   Shortness of breath dyspnea    secondary to weight    Sleep apnea    uses cpap   Ulcer 2001    Past Surgical History:  Procedure Laterality Date   ABDOMINAL HYSTERECTOMY  01/08/2006   BREAST BIOPSY Left 01/09/2011   CORE - FIBROADENOMA VS PHYLLODES   CESAREAN SECTION  01/08/2005   COLONOSCOPY WITH PROPOFOL  N/A 10/09/2016   Procedure: COLONOSCOPY WITH PROPOFOL ;  Surgeon: Therisa Bi, MD;  Location[facility] ENDOSCOPY;  Service: Gastroenterology;  Laterality: N/A;   COLONOSCOPY WITH PROPOFOL  N/A 11/09/2019   Procedure: COLONOSCOPY WITH PROPOFOL ;  Surgeon: Unk Corinn Skiff, MD;  Location: [facility] ENDOSCOPY;  Service: Gastroenterology;  Laterality: N/A;   ESOPHAGOGASTRODUODENOSCOPY     GUM SURGERY  01/08/2002   KNEE ARTHROSCOPY WITH MEDIAL MENISECTOMY Right 07/24/2021   Procedure: Right medial meniscus root repair and patella chondroplasty;  Surgeon: Tobie Priest, MD;  Location: [facility] ORS;  Service: Orthopedics;  Laterality: Right;   PLANTAR FASCIA RELEASE Left 08/19/2014   Procedure: ENDOSCOPIC PLANTAR FASCIOTOMY RELEASE L WITH TOPAZ;  Surgeon: Donnice Cory, DPM;  Location: [facility];  Service: Podiatry;  Laterality: Left;  LMA WITH POPLITEAL BLOCK   TONSILLECTOMY  01/08/1982    Family Psychiatric History: I reviewed family psychiatric history from progress note on 05/05/2021.  Family History:  Family History  Problem Relation Age of Onset   Obesity Mother    Diabetes Mother    High Cholesterol Mother    Hypertension Mother    Neuropathy Mother    Depression Mother    Anxiety disorder Mother    Asthma Mother    Obesity Father    Hypertension Father    High Cholesterol Father    Depression Father    Anxiety disorder Father    Seizures Brother    Testicular cancer Brother     Breast cancer Maternal Grandmother    Hypertension Maternal Grandmother    Aneurysm Maternal Grandmother    Stroke Maternal Grandmother    Hypertension Maternal Grandfather    High Cholesterol Maternal Grandfather    Heart disease Maternal Grandfather        has a pacemaker   Stroke Maternal Grandfather    Alzheimer's disease Paternal Grandmother    Dementia Paternal Grandmother    Colon cancer Paternal Grandfather    Mental illness Maternal Aunt    Suicidality Cousin     Social History: I have reviewed social history from progress note on 05/05/2021. Social History   Socioeconomic History   Marital status: Divorced    Spouse name: Not on file   Number of children: 2   Years of education: 14   Highest education level: Bachelor's degree (e.g., BA, AB, BS)  Occupational History   Not on file  Tobacco Use   Smoking status: Former    Current packs/day: 0.00    Average packs/day:  0.3 packs/day for 10.0 years (2.5 ttl pk-yrs)    Types: Cigarettes    Start date: 07/04/2004    Quit date: 07/05/2014    Years since quitting: 8.9   Smokeless tobacco: Never  Vaping Use   Vaping status: Never Used  Substance and Sexual Activity   Alcohol use: Yes    Comment: rare   Drug use: No   Sexual activity: Yes    Partners: Male    Birth control/protection: Surgical    Comment: 2007  Other Topics Concern   Not on file  Social History Narrative   Not on file   Social Drivers of Health   Financial Resource Strain: High Risk (03/04/2023)   Overall Financial Resource Strain (CARDIA)    Difficulty of Paying Living Expenses: Hard  Food Insecurity: Food Insecurity Present (03/04/2023)   Hunger Vital Sign    Worried About Running Out of Food in the Last Year: Often true    Ran Out of Food in the Last Year: Sometimes true  Transportation Needs: No Transportation Needs (03/04/2023)   PRAPARE - Administrator, Civil Service (Medical): No    Lack of Transportation (Non-Medical): No   Physical Activity: Inactive (03/04/2023)   Exercise Vital Sign    Days of Exercise per Week: 0 days    Minutes of Exercise per Session: 90 min  Stress: Stress Concern Present (03/04/2023)   [employer] of Occupational Health - Occupational Stress Questionnaire    Feeling of Stress : Very much  Social Connections: Moderately Isolated (03/04/2023)   Social Connection and Isolation Panel [NHANES]    Frequency of Communication with Friends and Family: Three times a week    Frequency of Social Gatherings with Friends and Family: Patient declined    Attends Religious Services: Never    Database Administrator or Organizations: No    Attends Engineer, Structural: Not on file    Marital Status: Living with partner    Allergies:  Allergies  Allergen Reactions   Wound Dressing Adhesive    Latex Rash   Metformin And Related     diarrhea    Metabolic Disorder Labs: Lab Results  Component Value Date   HGBA1C 5.2 11/08/2022   MPG 103 11/08/2022   MPG 111 12/15/2021   No results found for: PROLACTIN Lab Results  Component Value Date   CHOL 147 11/08/2022   TRIG 100 11/08/2022   HDL 47 (L) 11/08/2022   CHOLHDL 3.1 11/08/2022   VLDL 27 08/06/2019   LDLCALC 80 11/08/2022   LDLCALC 110 (H) 12/15/2021   Lab Results  Component Value Date   TSH 1.83 12/15/2021   TSH 2.59 04/04/2018    Therapeutic Level Labs: No results found for: LITHIUM No results found for: VALPROATE No results found for: CBMZ  Current Medications: Current Outpatient Medications  Medication Sig Dispense Refill   acetaminophen  (TYLENOL ) 500 MG tablet Take 1,000 mg by mouth every 6 (six) hours as needed for moderate pain (pain score 4-6).     Albuterol -Budesonide  (AIRSUPRA ) 90-80 MCG/ACT AERO Inhale 2 puffs into the lungs 4 (four) times daily as needed. 10.7 g 1   amLODipine  (NORVASC ) 2.5 MG tablet Take 1 tablet (2.5 mg total) by mouth daily. 90 tablet 1   atorvastatin  (LIPITOR) 20 MG  tablet Take 1 tablet (20 mg total) by mouth every morning. 90 tablet 1   budesonide -formoterol  (SYMBICORT ) 160-4.5 MCG/ACT inhaler Inhale 2 puffs into the lungs in the morning and at bedtime.  1 each 12   buPROPion  (WELLBUTRIN  XL) 150 MG 24 hr tablet TAKE 1 TABLET (150 MG TOTAL) BY MOUTH DAILY WITH BREAKFAST. STOP WELLBUTRIN  XL 300 MG 90 tablet 0   busPIRone  (BUSPAR ) 30 MG tablet TAKE 1 TABLET BY MOUTH 2 TIMES DAILY. 180 tablet 1   celecoxib  (CELEBREX ) 100 MG capsule Take 1 capsule (100 mg total) by mouth 2 (two) times daily. 180 capsule 0   clonazePAM  (KLONOPIN ) 0.5 MG tablet Take 1 tablet (0.5 mg total) by mouth daily as needed for anxiety. Please limit use (Patient not taking: Reported on 02/18/2023) 15 tablet 0   desloratadine  (CLARINEX ) 5 MG tablet Take 1 tablet (5 mg total) by mouth daily. 90 tablet 1   escitalopram  (LEXAPRO ) 20 MG tablet TAKE 1 TABLET BY MOUTH EVERY DAY 90 tablet 1   fluticasone  (FLONASE ) 50 MCG/ACT nasal spray Place 1 spray into both nostrils daily. 48 mL 1   gabapentin  (NEURONTIN ) 300 MG capsule Take 1 capsule by mouth 3 (three) times daily.     Melatonin 10 MG CAPS Take 10-20 mg by mouth at bedtime.     modafinil  (PROVIGIL ) 100 MG tablet Take 1 tablet (100 mg total) by mouth daily. 90 tablet 0   montelukast  (SINGULAIR ) 10 MG tablet Take 1 tablet (10 mg total) by mouth at bedtime. 90 tablet 1   Semaglutide -Weight Management 0.25 MG/0.5ML SOAJ Inject 0.25 mg into the skin once a week for 28 days. 2 mL 0   valsartan -hydrochlorothiazide  (DIOVAN -HCT) 160-25 MG tablet Take 1 tablet by mouth daily. 90 tablet 0   No current facility-administered medications for this visit.     Musculoskeletal: Strength & Muscle Tone: UTA Gait & Station: Seated Patient leans: N/A  Psychiatric Specialty Exam: Review of Systems  Psychiatric/Behavioral:  Positive for sleep disturbance.     There were no vitals taken for this visit.There is no height or weight on file to calculate BMI.   General Appearance: Casual  Eye Contact:  Fair  Speech:  Clear and Coherent  Volume:  Normal  Mood:  Euthymic  Affect:  Congruent  Thought Process:  Goal Directed and Descriptions of Associations: Intact  Orientation:  Full (Time, Place, and Person)  Thought Content: Logical   Suicidal Thoughts:  No  Homicidal Thoughts:  No  Memory:  Immediate;   Fair Recent;   Fair Remote;   Fair  Judgement:  Fair  Insight:  Fair  Psychomotor Activity:  Normal  Concentration:  Concentration: Fair and Attention Span: Fair  Recall:  [employer] of Knowledge: Fair  Language: Fair  Akathisia:  No  Handed:  Right  AIMS (if indicated): not done  Assets:  Communication Skills Desire for Improvement Housing Social Support Transportation  ADL's:  Intact  Cognition: WNL  Sleep:  sleep varies , affected by coffee intake   Screenings: AIMS    Flowsheet Row Video Visit from 06/13/2021 in [facility] Office Visit from 05/05/2021 in [facility]  AIMS Total Score 0 0      GAD-7    Flowsheet Row Office Visit from 05/08/2023 in [facility] Office Visit from 01/24/2023 in [facility] Office Visit from 01/15/2023 in [facility] Counselor from 01/10/2023 in [facility] Office Visit from 12/17/2022 in [facility]  Total GAD-7 Score 0 0 0 14 0      PHQ2-9  Flowsheet Row Procedure visit from 05/27/2023 in [facility] Visit from 05/09/2023 in [facility] Visit from 05/08/2023 in [facility] Office Visit from 01/24/2023 in [facility] Office Visit from 01/15/2023 in [facility]  PHQ-2 Total Score 0 0 0 0 0  PHQ-9 Total Score -- -- 0 0 0      Flowsheet Row Video Visit from 06/05/2023 in [facility] Video Visit from 03/26/2023 in [facility] Video Visit from 01/24/2023 in [facility]  C-SSRS RISK CATEGORY No Risk No Risk No Risk        Assessment and Plan: Barbara Pennington is a 43 year old Caucasian female who has a history of PTSD, borderline personality disorder, depression was evaluated by telemedicine today.  Discussed assessment and plan as noted below.  Generalized anxiety disorder-improving Currently reports mood symptoms as overall improving on the current medication regimen.  Currently engaged in therapy as well as DBT groups which has been helpful. Continue Lexapro  20 mg daily Continue Buspirone  30 mg twice daily Continue Wellbutrin  150 mg daily Continue Clonazepam  0.5 mg as needed for severe anxiety attacks only has been limiting use. Continue psychotherapy sessions with Ms. Veva, continue group participation/DBT groups.  Posttraumatic stress disorder-improving Currently reports symptoms is managed on the current medication regimen Continue current medications as listed above. Continue psychotherapy sessions with Ms. Veva.  Skin picking disorder-unstable Recent exacerbation of skin picking, motivated to continue to work on coping strategies, habit reversal and working with therapist. Continue Lexapro  and BuSpar  as prescribed Continue CBT  Borderline personality disorder per history Currently in DBT groups which has been beneficial. Continue to DBT/CBT.  Alcohol use disorder in remission Sober since 2017. Reassess in future sessions.  Follow-up Follow-up in clinic in 3 months or sooner in person.   Collaboration of Care: Collaboration of Care: Referral or follow-up with counselor/therapist AEB encouraged to  continue psychotherapy sessions.  Patient/Guardian was advised Release of Information must be obtained prior to any record release in order to collaborate their care with an outside provider. Patient/Guardian was advised if they have not already done so to contact the registration department to sign all necessary forms in order for us  to release information regarding their care.   Consent: Patient/Guardian gives verbal consent for treatment and assignment of benefits for services provided during this visit. Patient/Guardian expressed understanding and agreed to proceed.  This note was generated in part or whole with voice recognition software. Voice recognition is usually quite accurate but there are transcription errors that can and very often do occur. I apologize for any typographical errors that were not detected and corrected.     Naphtali Riede, MD 06/07/2023, 7:34 AM

## 2023-06-06 ENCOUNTER — Ambulatory Visit: Admitting: Professional Counselor

## 2023-06-06 DIAGNOSIS — F411 Generalized anxiety disorder: Secondary | ICD-10-CM | POA: Diagnosis not present

## 2023-06-06 DIAGNOSIS — F424 Excoriation (skin-picking) disorder: Secondary | ICD-10-CM

## 2023-06-06 DIAGNOSIS — F4310 Post-traumatic stress disorder, unspecified: Secondary | ICD-10-CM

## 2023-06-06 NOTE — Progress Notes (Unsigned)
 THERAPIST PROGRESS NOTE  Virtual Visit via Video Note  I connected with Natisha L Voland on 06/07/23 at  4:00 PM EDT by a video enabled telemedicine application and verified that I am speaking with the correct person using two identifiers.  Location: Patient: Home Provider: Remote office   I discussed the limitations of evaluation and management by telemedicine and the availability of in person appointments. The patient expressed understanding and agreed to proceed.   I discussed the assessment and treatment plan with the patient. The patient was provided an opportunity to ask questions and all were answered. The patient agreed with the plan and demonstrated an understanding of the instructions.   The patient was advised to call back or seek an in-person evaluation if the symptoms worsen or if the condition fails to improve as anticipated.  I provided 59 minutes of non-face-to-face time during this encounter. Len Quale, [facility]  Session Time: 4:00 PM - 4:59 PM   Participation Level: Active  Behavioral Response: Well Groomed, Alert, Dysphoric  Type of Therapy: Individual Therapy  Treatment Goals addressed: Active Anxiety  LTG: "I guess working through all the issues from the past. Learning how to better cope with stress."                Start:  02/05/23   End:  02/04/24   STG: "I don't deal with stress well. I get overwhelmed. I shut down. I get angry. I can't communicate my thoughts." To improve knowledge of DBT skills to reduce sxs per self report over the next 12 weeks.      ProgressTowards Goals: Progressing  Interventions: CBT, Motivational Interviewing, Conservator, museum/gallery, and Supportive  Summary: CYNDI MONTEJANO is a 43 y.o. female who presents with a history of anxiety, depression, skin picking, BPD, and PTSD. She appeared anxious/dysphoric but oriented x5. She was tearful throughout session. Renae discussed some work stressors, although she continues to enjoy  working from home. She expressed missing her parents, particularly talking with her mother, as they are on a cruise and unable to communicate at this time. Monya also struggles with finances and explored possible solutions for this. She shared about her struggle with skin picking. She was receptive to urge surfing as a tool and assigned it as homework for herself to research this tool and begin practicing it.   Therapist Response: Conducted session with [employer]unique. Began session with check-in/update since previous session. Utilized empathetic and reflective listening. Used open-ended questions to facilitate discussion and summarized Briseyda's thoughts/feelings. Used Socratic questioning to challenge negative thinking. Explored potential solutions to current stressors. Reminded Mimie of WISE MIND and donut exercises to balance emotion and logic and focus on what's within her control. Modeled assertive communication. Briefly discussed urge surfing to reduce skin picking behaviors. Scheduled additional appointment and concluded session.   Suicidal/Homicidal: No  Plan: Return again in 2 weeks.  Diagnosis: GAD (generalized anxiety disorder)  PTSD (post-traumatic stress disorder)  Skin-picking disorder  Collaboration of Care: Medication Management AEB chart review  Patient/Guardian was advised Release of Information must be obtained prior to any record release in order to collaborate their care with an outside provider. Patient/Guardian was advised if they have not already done so to contact the registration department to sign all necessary forms in order for us  to release information regarding their care.   Consent: Patient/Guardian gives verbal consent for treatment and assignment of benefits for services provided during this visit. Patient/Guardian expressed understanding and agreed to proceed.   Nellie Banas  Cherise Cornelia, [facility] 06/07/2023

## 2023-06-07 ENCOUNTER — Ambulatory Visit: Admitting: Professional Counselor

## 2023-06-14 ENCOUNTER — Ambulatory Visit: Admitting: Professional Counselor

## 2023-06-20 ENCOUNTER — Ambulatory Visit: Admitting: Professional Counselor

## 2023-06-20 DIAGNOSIS — F424 Excoriation (skin-picking) disorder: Secondary | ICD-10-CM

## 2023-06-20 DIAGNOSIS — F411 Generalized anxiety disorder: Secondary | ICD-10-CM

## 2023-06-20 DIAGNOSIS — F603 Borderline personality disorder: Secondary | ICD-10-CM | POA: Diagnosis not present

## 2023-06-20 NOTE — Progress Notes (Signed)
  THERAPIST PROGRESS NOTE  Virtual Visit via Video Note  I connected with Reta L Pellegrini on 06/20/23 at  4:00 PM EDT by a video enabled telemedicine application and verified that I am speaking with the correct person using two identifiers.  Location: Patient: Home Provider: Remote office   I discussed the limitations of evaluation and management by telemedicine and the availability of in person appointments. The patient expressed understanding and agreed to proceed.   I discussed the assessment and treatment plan with the patient. The patient was provided an opportunity to ask questions and all were answered. The patient agreed with the plan and demonstrated an understanding of the instructions.   The patient was advised to call back or seek an in-person evaluation if the symptoms worsen or if the condition fails to improve as anticipated.  I provided 47 minutes of non-face-to-face time during this encounter. Len Quale, [facility]  Session Time: 4:01 PM - 4:48 PM  Participation Level: Active  Behavioral Response: Well Groomed, Alert, Euthymic  Type of Therapy: Individual Therapy  Treatment Goals addressed: Active Anxiety  LTG: I guess working through all the issues from the past. Learning how to better cope with stress.                Start:  02/05/23   End:  02/04/24   STG: I don't deal with stress well. I get overwhelmed. I shut down. I get angry. I can't communicate my thoughts. To improve knowledge of DBT skills to reduce sxs per self report over the next 12 weeks.    ProgressTowards Goals: Progressing  Interventions: CBT, Motivational Interviewing, and Supportive  Summary: Kaleena L Stamour is a 43 y.o. female who presents with a history of anxiety, depression, skin picking, BPD, and PTSD. She appeared alert and oriented x5. She apologized again for missing group therapy. She noted plans to have an alarm set so she won't miss anymore. Graycee provided updates with family,  work and relationship. She noted she is still struggling with picking although she has attempted urge surfing. She was receptive to trying replacement activities/sensory activities to see it that helps anymore. Latiesha actively listened and took notes on material missed from group. She was in agreement to share goal that's more honest.   Therapist Response: Conducted session with Pernie. Began session with check-in/update since previous session. Utilized empathetic and reflective listening. Discussed replacement activities or sensory activities to help reduce skin picking. Reviewed mindfulness module How skills and Loving Kindness. Encouraged Ronesha to be more honest about goal (urge surfing). Confirmed next appointment and concluded session.   Suicidal/Homicidal: No  Plan: Return again in 4 weeks.  Diagnosis: Skin-picking disorder  GAD (generalized anxiety disorder)  Borderline personality disorder (HCC)  Collaboration of Care: Medication Management AEB chart review  Patient/Guardian was advised Release of Information must be obtained prior to any record release in order to collaborate their care with an outside provider. Patient/Guardian was advised if they have not already done so to contact the registration department to sign all necessary forms in order for us  to release information regarding their care.   Consent: Patient/Guardian gives verbal consent for treatment and assignment of benefits for services provided during this visit. Patient/Guardian expressed understanding and agreed to proceed.   Len Quale, [facility] 06/20/2023

## 2023-06-21 ENCOUNTER — Ambulatory Visit (INDEPENDENT_AMBULATORY_CARE_PROVIDER_SITE_OTHER): Admitting: Professional Counselor

## 2023-06-21 DIAGNOSIS — F603 Borderline personality disorder: Secondary | ICD-10-CM | POA: Diagnosis not present

## 2023-06-21 DIAGNOSIS — F424 Excoriation (skin-picking) disorder: Secondary | ICD-10-CM

## 2023-06-23 NOTE — Progress Notes (Signed)
  THERAPIST PROGRESS NOTE  Virtual Visit via Video Note  I connected with Barbara Pennington on 06/23/23 at 12:00 PM EDT by a video enabled telemedicine application and verified that I am speaking with the correct person using two identifiers.  Location: Patient: Home Provider: Remote office   I discussed the limitations of evaluation and management by telemedicine and the availability of in person appointments. The patient expressed understanding and agreed to proceed.   I discussed the assessment and treatment plan with the patient. The patient was provided an opportunity to ask questions and all were answered. The patient agreed with the plan and demonstrated an understanding of the instructions.   The patient was advised to call back or seek an in-person evaluation if the symptoms worsen or if the condition fails to improve as anticipated.  I provided 65 minutes of non-face-to-face time during this encounter. Len Quale, [facility]  Session Time: 12:00 PM - 1:05 PM  Participation Level: Active  Behavioral Response: Well Groomed, Alert, Euthymic  Type of Therapy: Group Therapy  Interventions: CBT and Supportive  Summary: Barbara Pennington is a 43 y.o. female who presents with a history of anxiety, borderline personality disorder, and skin picking disorder. She appeared alert and oriented x5. She stated her week has been good. She reported she did well with her goal of being more active. Nazli stated her goal for this week is to practice urge surfing to help changing a problematic behavior.   Therapist Response: Conducted telehealth group session. Began session with introductions and review of group rules and expectations. Engaged group members in mindfulness exercise - guided meditation for loving kindness. Conducted brief check-in with each group member. Reviewed handouts and worksheets - Interpersonal effectiveness - Goals, Myths, and DEAR MAN. Passenger transport manager with each group member  and goal setting between now and next session.   Suicidal/Homicidal: No  Diagnosis: Borderline personality disorder (HCC)  Skin-picking disorder  Patient/Guardian was advised Release of Information must be obtained prior to any record release in order to collaborate their care with an outside provider. Patient/Guardian was advised if they have not already done so to contact the registration department to sign all necessary forms in order for us  to release information regarding their care.   Consent: Patient/Guardian gives verbal consent for treatment and assignment of benefits for services provided during this visit. Patient/Guardian expressed understanding and agreed to proceed.   Len Quale, [facility] 06/23/2023

## 2023-06-24 ENCOUNTER — Ambulatory Visit: Admitting: Student in an Organized Health Care Education/Training Program

## 2023-06-27 ENCOUNTER — Ambulatory Visit: Attending: Student in an Organized Health Care Education/Training Program | Admitting: Nurse Practitioner

## 2023-06-27 ENCOUNTER — Encounter: Payer: Self-pay | Admitting: Nurse Practitioner

## 2023-06-27 VITALS — BP 110/76 | HR 77 | Temp 98.2°F | Resp 16 | Wt 340.0 lb

## 2023-06-27 DIAGNOSIS — G894 Chronic pain syndrome: Secondary | ICD-10-CM | POA: Insufficient documentation

## 2023-06-27 DIAGNOSIS — M1712 Unilateral primary osteoarthritis, left knee: Secondary | ICD-10-CM | POA: Insufficient documentation

## 2023-06-27 DIAGNOSIS — M25562 Pain in left knee: Secondary | ICD-10-CM | POA: Diagnosis present

## 2023-06-27 DIAGNOSIS — G8929 Other chronic pain: Secondary | ICD-10-CM | POA: Diagnosis present

## 2023-06-27 DIAGNOSIS — Z9889 Other specified postprocedural states: Secondary | ICD-10-CM | POA: Diagnosis present

## 2023-06-27 NOTE — Progress Notes (Signed)
 PROVIDER NOTE: Interpretation of information contained herein should be left to medically-trained personnel. Specific patient instructions are provided elsewhere under Patient Instructions section of medical record. This document was created in part using AI and STT-dictation technology, any transcriptional errors that may result from this process are unintentional.  Patient: Barbara Pennington  Service: E/M   PCP: Sowles, Krichna, MD  DOB: 13-Jan-1980  DOS: 06/27/2023  Provider: Cherylin Corrigan, NP  MRN: [id number]  Delivery: Face-to-face  Specialty: Interventional Pain Management  Type: Established Patient  Setting: Ambulatory outpatient facility  Specialty designation: 09  Referring Prov.: Sowles, Krichna, MD  Location: Outpatient office facility       History of present illness (HPI) Barbara Pennington, a 43 y.o. year old female, is here today because of her Chronic pain of left knee [M25.562, G89.29]. Ms. Lickteig primary complain today is Knee Pain (left)  Pertinent problems: [name] has Low back pain; asthma, Mild persistent; PTSD (posttraumatic stress disorder); Morbid obesity with BMI of 50-59.9, adult (HCC); OSA on CPAP; CKD (chronic kidney disease) stage III, Closed nondisplaced longitudinal fracture of right patella; Left knee pain and Chronic pain syndrome on their pertinent problem list.  Pain Assessment: Severity of Chronic pain is reported as a 2 /10. Location: Knee Left/denies. Onset: More than a month ago. Quality: Aching. Timing: Constant. Modifying factor(s): rest, ice, elevation, OTC meds. Vitals:  weight is 340 lb (154.2 kg) (abnormal). Her temporal temperature is 98.2 F (36.8 C). Her blood pressure is 110/76 and her pulse is 77. Her respiration is 16 and oxygen saturation is 97%.  BMI: Estimated body mass index is 56.58 kg/m as calculated from the following:   Height as of 05/31/23: 5' 5 (1.651 m).   Weight as of this encounter: 340 lb (154.2 kg).  Last encounter:  05/09/2023 Last procedure: 05/27/2023  Reason for encounter: post-procedure evaluation and assessment and possible interventional therapy.   Barbara Pennington received genicular nerve block on the left side on May 27, 2023.  She reports 80% pain relief and functional improvement during local anesthetic phase, followed by approximately 80% relief for about 3 weeks however the pain gradually returned to baseline.  Although the pain has gradually returned to baseline, she expressed interest in undergoing another genicular nerve block as it previously provides significant pain relief despite intermittent discomfort.  After evaluating her current symptoms and prior positive response to the procedure we agreed that repeating the genicular nerve block is a reasonable next step.  Procedure Type: Genicular Nerves Block (Superolateral, Superomedial, and Inferomedial Genicular Nerves)  #1  Laterality: Left (-LT)  Level: Superior and inferior to the knee joint.  Imaging: Fluoroscopic guidance Anesthesia: Local anesthesia (1-2% Lidocaine ) Sedation: Minimal Sedation                       DOS: 05/27/2023  Performed by: Cephus Collin, MD   Purpose: Diagnostic/Therapeutic Indications: Chronic knee pain severe enough to impact quality of life or function. Rationale (medical necessity): procedure needed and proper for the diagnosis and/or treatment of Barbara Pennington's medical symptoms and needs. 1. Arthritis of left knee   2. Chronic pain of left knee     NAS-11 Pain score:        Pre-procedure: 3 /10        Post-procedure: 3 /10   Post-Procedure Evaluation   Effectiveness:  Initial hour after procedure: 80 % . Subsequent 4-6 hours post-procedure: 20 % . Analgesia past initial 6 hours: 80 % (  lasted 3 weeks) . Ongoing improvement:  Analgesic:  Ms. Wist received genicular nerve block on the left side on May 27, 2023.  She reports 80% pain relief and functional improvement during local anesthetic phase,  followed by approximately 80% relief for about 3 weeks however the pain gradually returned to baseline. Function: Somewhat improved ROM: Somewhat improved  Pharmacotherapy Assessment   Analgesic:  Monitoring: [place] PMP: PDMP reviewed during this encounter.       Pharmacotherapy: No side-effects or adverse reactions reported. Compliance: No problems identified. Effectiveness: Clinically acceptable.  No notes on file  UDS:  No results found for: SUMMARY  No results found for: CBDTHCR No results found for: D8THCCBX No results found for: D9THCCBX  ROS  Constitutional: Denies any fever or chills Gastrointestinal: No reported hemesis, hematochezia, vomiting, or acute GI distress Musculoskeletal: Left knee pain Neurological: No reported episodes of acute onset apraxia, aphasia, dysarthria, agnosia, amnesia, paralysis, loss of coordination, or loss of consciousness  Medication Review  Albuterol -Budesonide , Melatonin, Semaglutide -Weight Management, acetaminophen , amLODipine , atorvastatin , buPROPion , budesonide -formoterol , busPIRone , celecoxib , clonazePAM , desloratadine , escitalopram , fluticasone , gabapentin , modafinil , montelukast , and valsartan -hydrochlorothiazide   History Review  Allergy: Barbara Pennington is allergic to wound dressing adhesive, latex, and metformin and related. Drug: Barbara Pennington  reports no history of drug use. Alcohol:  reports current alcohol use. Tobacco:  reports that she quit smoking about 8 years ago. Her smoking use included cigarettes. She started smoking about 18 years ago. She has a 2.5 pack-year smoking history. She has never used smokeless tobacco. Social: Barbara Pennington  reports that she quit smoking about 8 years ago. Her smoking use included cigarettes. She started smoking about 18 years ago. She has a 2.5 pack-year smoking history. She has never used smokeless tobacco. She reports current alcohol use. She reports that she does not use drugs. Medical:  has a  past medical history of Anxiety, Arthritis (2006), Asthma (2003), Borderline personality disorder (HCC), Chronic kidney disease, Complication of anesthesia, Depression, GERD (gastroesophageal reflux disease), History of kidney stones, Hyperlipidemia, Hypertension (2013), Lump or mass in breast (2013), Motion sickness, PTSD (post-traumatic stress disorder), Scoliosis, Shortness of breath dyspnea, Sleep apnea, and Ulcer (2001). Surgical: Ms. Haidar  has a past surgical history that includes Tonsillectomy (01/08/1982); Cesarean section (01/08/2005); Gum surgery (01/08/2002); Abdominal hysterectomy (01/08/2006); Plantar fascia release (Left, 08/19/2014); Breast biopsy (Left, 01/09/2011); Colonoscopy with propofol  (N/A, 10/09/2016); Colonoscopy with propofol  (N/A, 11/09/2019); Esophagogastroduodenoscopy; and Knee arthroscopy with medial menisectomy (Right, 07/24/2021). Family: family history includes Alzheimer's disease in her paternal grandmother; Aneurysm in her maternal grandmother; Anxiety disorder in her father and mother; Asthma in her mother; Breast cancer in her maternal grandmother; Colon cancer in her paternal grandfather; Dementia in her paternal grandmother; Depression in her father and mother; Diabetes in her mother; Heart disease in her maternal grandfather; High Cholesterol in her father, maternal grandfather, and mother; Hypertension in her father, maternal grandfather, maternal grandmother, and mother; Mental illness in her maternal aunt; Neuropathy in her mother; Obesity in her father and mother; Seizures in her brother; Stroke in her maternal grandfather and maternal grandmother; Suicidality in her cousin; Testicular cancer in her brother.  Laboratory Chemistry Profile   Renal Lab Results  Component Value Date   BUN 12 01/22/2023   CREATININE 0.85 01/22/2023   LABCREA 182 04/04/2018   BCR SEE NOTE: 01/22/2023   GFRAA 61 06/08/2020   GFRNONAA >60 01/10/2023    Hepatic Lab Results   Component Value Date   AST 6 (L) 01/15/2023   ALT 12 01/15/2023  ALBUMIN 4.1 04/04/2020   ALKPHOS 45 04/04/2020   LIPASE 38 04/04/2020    Electrolytes Lab Results  Component Value Date   NA 142 01/22/2023   K 4.3 01/22/2023   CL 106 01/22/2023   CALCIUM  9.7 01/22/2023    Bone Lab Results  Component Value Date   VD25OH 37 11/08/2022    Inflammation (CRP: Acute Phase) (ESR: Chronic Phase) Lab Results  Component Value Date   CRP 18.4 (H) 01/22/2023   ESRSEDRATE 2 06/08/2020   LATICACIDVEN 0.7 01/13/2017         Note: Above Lab results reviewed.  Recent Imaging Review  Narrative CLINICAL DATA:  Acute presentation with spinal pain.   EXAM: CERVICAL SPINE - 2-3 VIEW   COMPARISON:  03/12/2012   FINDINGS: No malalignment. No focal bone finding. No disc space narrowing. No facet arthropathy.   IMPRESSION: Negative cervical spine radiographs.     Electronically Signed By: Bettylou Brunner M.D. On: 08/06/2017 19:05     DG Thoracic Spine 2 View   Narrative CLINICAL DATA:  Acute presentation with spinal pain.   EXAM: THORACIC SPINE 2 VIEWS   COMPARISON:  05/16/2015   FINDINGS: Very minimal spinal curvature, not significant. No focal bone lesion. No disc space narrowing. Posteromedial ribs appear normal.   IMPRESSION: No significant finding. Minimal spinal curvature, not likely significant.     Electronically Signed By: Bettylou Brunner M.D. On: 08/06/2017 19:06     Narrative CLINICAL DATA:  Acute presentation with back pain   EXAM: LUMBAR SPINE - COMPLETE 4+ VIEW   COMPARISON:  CT 01/13/2017   FINDINGS: Five lumbar type vertebral bodies show normal alignment. No disc space narrowing. Mild lower lumbar facet arthritis. Sacroiliac joints appear normal.   IMPRESSION: No advanced or likely significant finding. Mild lower lumbar facet arthritis.     Electronically Signed By: Bettylou Brunner M.D. On: 08/06/2017 19:07 DG Hip Unilat W or Wo Pelvis  2-3 Views Left   Narrative CLINICAL DATA:  Recent fall with left hip pain, initial encounter   EXAM: DG HIP (WITH OR WITHOUT PELVIS) 2-3V LEFT   COMPARISON:  None.   FINDINGS: No acute fracture or dislocation is noted. No soft tissue abnormality is seen. Degenerative changes of the hip joint are noted.   IMPRESSION: No acute abnormality noted.     Electronically Signed By: Violeta Grey M.D. On: 08/04/2017 13:36   Narrative CLINICAL DATA:  Knee trauma. Internal derangement suspected. Fall in shower 2 weeks ago. Heard a pop in left knee. Lateral pain.   EXAM: MRI OF THE LEFT KNEE WITHOUT CONTRAST   TECHNIQUE: Multiplanar, multisequence MR imaging of the knee was performed. No intravenous contrast was administered.   COMPARISON:  Left knee radiographs 03/10/2021   FINDINGS: MENISCI   Medial meniscus: There is mild intermediate proton density signal intrasubstance degeneration within the junction of the body and posterior horn of the medial meniscus. No tear is seen extending through an articular surface.   Lateral meniscus:  Intact.   LIGAMENTS   Cruciates: The ACL and PCL are intact.   Collaterals: The medial collateral ligament is intact. The fibular collateral ligament, biceps femoris tendon, iliotibial band, and popliteus tendon are intact.   CARTILAGE   Patellofemoral: There is moderate thinning of the superior aspect of the junction of the patellar apex and lateral patellar facet cartilage with moderate subchondral cystic change (axial image 7 and sagittal image 21). Mild thinning of the lateral trochlear cartilage.   Medial:  Mild weight-bearing medial femoral condyle cartilage thinning.   Lateral:  Intact.   Joint: Smalljoint effusion. Normal Hoffa's fat pad. No plical thickening. Moderate edema and mild fluid within the deep subcutaneous fat anterior to the patellar tendon and proximal tibia, greatest at the level of the proximal tibia.    Popliteal Fossa:  No Baker's cyst.   Extensor Mechanism:  Intact quadriceps tendon and patellar tendon.   Bones:  No acute fracture or dislocation.   Other: None.   IMPRESSION:: IMPRESSION: 1. 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 2. Small joint effusion. 3. Intact menisci, cruciate ligaments, and collateral ligaments.     Electronically Signed By: Bertina Broccoli M.D. On: 03/28/2021 13:10   MR KNEE RIGHT WO CONTRAST   Narrative CLINICAL DATA:  Right knee pain. History of arthroscopic knee surgery in July 2023 for repair of a medial meniscus root tear.   EXAM: MRI OF THE RIGHT KNEE WITHOUT CONTRAST   TECHNIQUE: Multiplanar, multisequence MR imaging of the knee was performed. No intravenous contrast was administered.   COMPARISON:  MRIs 06/25/2021   FINDINGS: Examination is somewhat limited due to artifact related to the patient's body habitus with poor signal to noise ratio.   MENISCI   Medial meniscus: Surgical changes related to a prior posterior horn medial meniscus repair. There is moderate artifact and although I do not see an obvious recurrent tear there is persistent or recurrent meniscal protrusion medially estimated at 5 mm. Associated intrasubstance degenerative type meniscal signal changes as seen on the prior study.   Lateral meniscus:  Intact   LIGAMENTS   Cruciates:  Intact   Collaterals: Moderate edema like signal changes in and around the [facility] which appears stable when compared to the prior study and is likely a combination of remote MCL injury and MCL and pes anserine bursitis.   CARTILAGE   Patellofemoral:  Moderate degenerative chondrosis   Medial: Moderate degenerative chondrosis with early spurring changes.   Lateral:  Mild degenerative chondrosis with early spurring.   Joint: Moderate-sized joint effusion. Superior and medial patellar plica.   Popliteal Fossa:  No popliteal mass or Baker's cyst.    Extensor Mechanism: The patella retinacular structures are intact and the quadriceps and patellar tendons are intact.   Bones: Surgical changes noted involving the medial tibia related to the meniscal repair. Patchy marrow edema involving the medial tibial plateau may be ongoing stress related process but no subchondral stress fracture.   Other: Unremarkable knee musculature.   IMPRESSION: 1. Surgical changes related to a prior posterior horn medial meniscus repair. There is moderate artifact and although I do not see an obvious recurrent tear there is persistent or recurrent meniscal protrusion medially estimated at 5 mm. 2. Intact ligamentous structures. Stable appearing thickening and inflammation type changes involving the MCL with MCL and pes anserine bursitis. 3. Patchy marrow edema involving the medial tibial plateau may be ongoing stress related process but no subchondral stress fracture. 4. Moderate-sized joint effusion. 5. Moderate tricompartmental degenerative chondrosis.     Electronically Signed By: Marrian Siva M.D. On: 10/17/2021 13:44     DG Knee Complete 4 Views Right   Narrative CLINICAL DATA:  Right knee pain after injury.  Subsequent encounter.   EXAM: RIGHT KNEE - COMPLETE 4+ VIEW   COMPARISON:  No prior right knee exams.   FINDINGS: Normal alignment. No acute fracture. Subcortical lucency in the superior patella favors subchondral cyst. Joint spaces are preserved. No erosion or bony destruction.  There may be a small knee joint effusion.   IMPRESSION: 1. No acute fracture or dislocation of the right knee. 2. Superior patellar subchondral cyst, may be degenerative or secondary to prior chondral injury.     Electronically Signed By: Chadwick Colonel M.D. On: 06/11/2021 01:35   Knee-L DG 4 views: Results for orders placed during the hospital encounter of 03/10/21   DG Knee Complete 4 Views Left   Narrative CLINICAL DATA:  Status post  fall.   EXAM: LEFT KNEE - COMPLETE 4+ VIEW   COMPARISON:  None.   FINDINGS: No evidence of acute fracture or dislocation. No evidence of arthropathy or other focal bone abnormality. A very small joint effusion is noted.   IMPRESSION: 1. No acute osseous abnormality. 2. Very small joint effusion.     Electronically Signed By: Virgle Grime M.D. On: 03/11/2021 23:45       Narrative CLINICAL DATA:  Acute RIGHT ankle pain for 1 day. Initial encounter.   EXAM: RIGHT ANKLE - COMPLETE 3+ VIEW   COMPARISON:  09/07/1998 10 foot radiographs   FINDINGS: No fracture, subluxation or dislocation.   The joint spaces are unremarkable.   A small calcaneal spur is present.   No other focal bony abnormalities are noted.   IMPRESSION: Small calcaneal spur without other bony/joint abnormality.     Electronically Signed By: Sundra Engel M.D.     Complexity Note: Imaging results reviewed.               Physical Exam  General appearance: Well nourished, well developed, and well hydrated. In no apparent acute distress Mental status: Alert, oriented x 3 (person, place, & time)       Respiratory: No evidence of acute respiratory distress Eyes: PERLA Vitals: BP 110/76 (Cuff Size: Large)   Pulse 77   Temp 98.2 F (36.8 C) (Temporal)   Resp 16   Wt (!) 340 lb (154.2 kg)   SpO2 97%   BMI 56.58 kg/m  BMI: Estimated body mass index is 56.58 kg/m as calculated from the following:   Height as of 05/31/23: 5' 5 (1.651 m).   Weight as of this encounter: 340 lb (154.2 kg). Ideal: Ideal body weight: 57 kg (125 lb 10.6 oz) Adjusted ideal body weight: 95.9 kg (211 lb 6.4 oz)  Left knee pain worse with weight bearing and walking  Assessment   Diagnosis Status  1. Chronic pain of left knee   2. Chronic pain syndrome   3. Arthritis of left knee   4. Status post medial meniscus repair (RIGHT)    Having a Flare-up Controlled Controlled   Updated Problems: No problems  updated.  Plan of Care  Problem-specific:  Assessment and Plan  Left knee pain  The patient continues to experience chronic left knee pain consistent with osteoarthritis, with limited and short-term relief from prior cortisone and gel injection.  She has no history of surgical intervention on the left knee.  At today's visit, the patient reported that although her pain has gradually returned to the baseline, she is interested in repeating the genicular nerve block as her prior procedure provided notable relief with only intermittent discomfort.  Plan: Genicular nerve block # 1 (05/27/2023) Completed  Ms. Melodye L Birr has a current medication list which includes the following long-term medication(s): amlodipine , atorvastatin , budesonide -formoterol , bupropion , desloratadine , escitalopram , fluticasone , gabapentin , modafinil , montelukast , valsartan -hydrochlorothiazide , and clonazepam .  Pharmacotherapy (Medications Ordered): No orders of the defined types were placed in this encounter.  Orders:  Orders Placed This Encounter  Procedures   GENICULAR NERVE BLOCK    Indication(s):  Sub-acute knee pain    Standing Status:   Future    Expiration Date:   09/27/2023    Scheduling Instructions:     Side: Left-sided     Sedation: Patient's choice.     Timeframe: As soon as schedule allows    Where will this procedure be performed?:   [facility]        No follow-ups on file.    Recent Visits Date Type Provider Dept  05/27/23 Procedure visit Cephus Collin, MD [facility]  05/09/23 Office Visit Cephus Collin, MD [facility]  Showing recent visits within past 90 days and meeting all other requirements Today's Visits Date Type Provider Dept  06/27/23 Office Visit Odas Ozer K, NP [facility]  Showing today's visits and meeting all other requirements Future Appointments Date Type Provider Dept  07/10/23 Appointment Cephus Collin, MD [facility]  Showing future appointments within next 90 days and meeting all other requirements  I discussed the assessment and treatment plan with the patient. The patient was provided an opportunity to ask questions and all were answered. The patient agreed with the plan and demonstrated an understanding of the instructions.  Patient advised to call back or seek an in-person evaluation if the symptoms or condition worsens.  Duration of encounter: 25 minutes.  Total time on encounter, as per AMA guidelines included both the face-to-face and non-face-to-face time personally spent by the physician and/or other qualified health care professional(s) on the day of the encounter (includes time in activities that require the physician or other qualified health care professional and does not include time in activities normally performed by clinical staff). Physician's time may include the following activities when performed: Preparing to see the patient (e.g., pre-charting review of records, searching for previously ordered imaging, lab work, and nerve conduction tests) Review of prior analgesic pharmacotherapies. Reviewing PMP Interpreting ordered tests (e.g., lab work, imaging, nerve conduction tests) Performing post-procedure evaluations, including interpretation of diagnostic procedures Obtaining and/or reviewing separately obtained history Performing a medically appropriate examination and/or evaluation Counseling and educating the patient/family/caregiver Ordering medications, tests, or procedures Referring and communicating with other health care professionals (when not separately reported) Documenting clinical information in the electronic or other health record Independently interpreting results (not separately reported) and communicating results to the patient/ family/caregiver Care coordination (not separately reported)  Note by: Adda Stokes K Lynae Pederson, NP (TTS and AI technology used. I apologize for  any typographical errors that were not detected and corrected.) Date: 06/27/2023; Time: 12:58 PM

## 2023-06-27 NOTE — Patient Instructions (Signed)
 ______________________________________________________________________    Genicular Nerve Block  What is a genicular nerve block? A genicular nerve block is the injection of a local anesthetic to block the nerves that transmits pain from the knee.  What is the purpose of a facet nerve block? A genicular nerve block is a diagnostic procedure to determine if the pathologic changes (i.e. arthritis, meniscal tears, etc) and inflammation within the knee joint is the source of your knee pain. It also confirms that the knee pain will respond well to the actual treatment procedure. If a genicular nerve block works, it will give you relief for several hours. After that, the pain is expected to return to normal. This test is always performed twice (usually a week or two apart) because two successful tests are required to move onto treatment. If both diagnostic tests are positive, then we schedule a treatment called radiofrequency (RF) ablation. In this procedure, the same nerves are cauterized, which typically leads to pain relief for 4 -18 months. If this process works well for one knee, it can be performed on the other knee if needed.  How is the procedure performed? You will be placed on the procedure table. The injection site is sterilized with either iodine or chlorhexadine. The site to be injected is numbed with a local anesthetic, and a needle is directed to the target area. X-ray guidance is used to ensure proper placement and positioning of the needle. When the needle is properly positioned near the genicular nerve, local anesthetic is injected to numb that nerve. This will be repeated at multiple sites around the knee to block all genicular nerves.  Will the procedure be painful? The injection can be painful and we therefore provide the option of receiving IV sedation. IV sedation, combined with local anesthetic, can make the injection nearly pain free. It allows you to remain very still during the  procedure, which can also make the injection easier, faster, and more successful. If you decide to have IV sedation, you must have a driver to get you home safely afterwards. In addition, you cannot have anything to eat or drink within 8 hours of your appointment (clear liquids are allowed until 3 hours before the procedure). If you take medications for diabetes, these medications may need to be adjusted the morning of the procedure. Your primary care physician can help you with this adjustment.  What are the discharge instructions? If you received IV sedation do not drive or operate machinery for at least 24 hours after the procedure. You may return to work the next day following your procedure. You may resume your normal diet immediately. Do not engage in any strenuous activity for 24 hours. You should, however, engage in moderate activity that typically causes your ususal pain. If the block works, those activities should not be painful for several hours after the injection. Do not take a bath, swim, or use a hot tub for 24 hours (you may take a shower). Call the office if you have any of the following: severe pain afterwards (different than your usual symptoms), redness/swelling/discharge at the injection site(s), fevers/chills, difficulty with bowel or bladder functions.  What are the risks and side effects? The complication rate for this procedure is very low. Whenever a needle enters the skin, bleeding or infection can occur. Some other serious but extremely rare risks include paralysis and death. You may have an allergic reaction to any of the medications used. If you have a known allergy to any medications, especially local  anesthetics, notify our staff before the procedure takes place. You may experience any of the following side effects up to 4 - 6 hours after the procedure: Leg muscle weakness or numbness may occur due to the local anesthetic affecting the nerves that control your legs (this is a  temporary affect and it is not paralysis). If you have any leg weakness or numbness, walk only with assistance in order to prevent falls and injury. Your leg strength will return slowly and completely. Dizziness may occur due to a decrease in your blood pressure. If this occurs, remain in a seated or lying position. Gradually sit up, and then stand after at least 10 minutes of sitting. Mild headaches may occur. Drink fluids and take pain medications if needed. If the headaches persist or become severe, call the office. Mild discomfort at the injection site can occur. This typically lasts for a few hours but can persist for a couple days. If this occurs, take anti-inflammatories or pain medications, apply ice to the area the day of the procedure. If it persists, apply moist heat in the day(s) following.  The side effects listed above can be normal. They are not dangerous and will resolve on their own. If, however, you experience any of the following, a complication may have occurred and you should either contact your doctor. If he is not readily available, then you should proceed to the closest urgent care center for evaluation: Severe or progressive pain at the injection site(s) Arm or leg weakness that progressively worsens or persists for longer than 8 hours Severe or progressive redness, swelling, or discharge from the injections site(s) Fevers, chills, nausea, or vomiting Bowel or bladder dysfunction (i.e. inability to urinate or pass stool or difficulty controlling either)  How long does it take for the procedure to work? You should feel relief from your usual pain within the first hour. Again, this is only expected to last for several hours, at the most. Remember, you may be sore in the middle part of your back from the needles, and you must distinguish this from your usual pain. ______________________________________________________________________     ______________________________________________________________________    Preparing for your procedure  Appointments: If you think you may not be able to keep your appointment, call 24-48 hours in advance to cancel. We need time to make it available to others.  Procedure visits are for procedures only. During your procedure appointment there will be: NO Prescription Refills*. NO medication changes or discussions*. NO discussion of disability issues*. NO unrelated pain problem evaluations*. NO evaluations to order other pain procedures*. *These will be addressed at a separate and distinct evaluation encounter on the provider's evaluation schedule and not during procedure days.  Instructions: Food intake: Avoid eating anything solid for at least 8 hours prior to your procedure. Clear liquid intake: You may take clear liquids such as water up to 2 hours prior to your procedure. (No carbonated drinks. No soda.) Transportation: Unless otherwise stated by your physician, bring a driver. (Driver cannot be a Market researcher, Pharmacist, community, or any other form of public transportation.) Morning Medicines: Except for blood thinners, take all of your other morning medications with a sip of water. Make sure to take your heart and blood pressure medicines. If your blood pressure's lower number is above 100, the case will be rescheduled. Blood thinners: Make sure to stop your blood thinners as instructed.  If you take a blood thinner, but were not instructed to stop it, call our office [phone] and  ask to talk to a nurse. Not stopping a blood thinner prior to certain procedures could lead to serious complications. Diabetics on insulin: Notify the staff so that you can be scheduled 1st case in the morning. If your diabetes requires high dose insulin, take only  of your normal insulin dose the morning of the procedure and notify the staff that you have done so. Preventing infections: Shower with an antibacterial soap the  morning of your procedure.  Build-up your immune system: Take 1000 mg of Vitamin C with every meal (3 times a day) the day prior to your procedure. Antibiotics: Inform the nursing staff if you are taking any antibiotics or if you have any conditions that may require antibiotics prior to procedures. (Example: recent joint implants)   Pregnancy: If you are pregnant make sure to notify the nursing staff. Not doing so may result in injury to the fetus, including death.  Sickness: If you have a cold, fever, or any active infections, call and cancel or reschedule your procedure. Receiving steroids while having an infection may result in complications. Arrival: You must be in the facility at least 30 minutes prior to your scheduled procedure. Tardiness: Your scheduled time is also the cutoff time. If you do not arrive at least 15 minutes prior to your procedure, you will be rescheduled.  Children: Do not bring any children with you. Make arrangements to keep them home. Dress appropriately: There is always a possibility that your clothing may get soiled. Avoid long dresses. Valuables: Do not bring any jewelry or valuables.  Reasons to call and reschedule or cancel your procedure: (Following these recommendations will minimize the risk of a serious complication.) Surgeries: Avoid having procedures within 2 weeks of any surgery. (Avoid for 2 weeks before or after any surgery). Flu Shots: Avoid having procedures within 2 weeks of a flu shots or . (Avoid for 2 weeks before or after immunizations). Barium: Avoid having a procedure within 7-10 days after having had a radiological study involving the use of radiological contrast. (Myelograms, Barium swallow or enema study). Heart attacks: Avoid any elective procedures or surgeries for the initial 6 months after a Myocardial Infarction (Heart Attack). Blood thinners: It is imperative that you stop these medications before procedures. Let us  know if you if you take  any blood thinner.  Infection: Avoid procedures during or within two weeks of an infection (including chest colds or gastrointestinal problems). Symptoms associated with infections include: Localized redness, fever, chills, night sweats or profuse sweating, burning sensation when voiding, cough, congestion, stuffiness, runny nose, sore throat, diarrhea, nausea, vomiting, cold or Flu symptoms, recent or current infections. It is specially important if the infection is over the area that we intend to treat. Heart and lung problems: Symptoms that may suggest an active cardiopulmonary problem include: cough, chest pain, breathing difficulties or shortness of breath, dizziness, ankle swelling, uncontrolled high or unusually low blood pressure, and/or palpitations. If you are experiencing any of these symptoms, cancel your procedure and contact your primary care physician for an evaluation.  Remember:  Regular Business hours are:  Monday to Thursday 8:00 AM to 4:00 PM  Provider's Schedule: Renaldo Caroli, MD:  Procedure days: Tuesday and Thursday 7:30 AM to 4:00 PM  Cephus Collin, MD:  Procedure days: Monday and Wednesday 7:30 AM to 4:00 PM Last  Updated: 12/18/2022 ______________________________________________________________________

## 2023-06-28 ENCOUNTER — Ambulatory Visit (INDEPENDENT_AMBULATORY_CARE_PROVIDER_SITE_OTHER): Admitting: Professional Counselor

## 2023-06-28 ENCOUNTER — Other Ambulatory Visit (HOSPITAL_COMMUNITY): Payer: Self-pay

## 2023-06-28 DIAGNOSIS — F424 Excoriation (skin-picking) disorder: Secondary | ICD-10-CM

## 2023-06-28 DIAGNOSIS — F603 Borderline personality disorder: Secondary | ICD-10-CM

## 2023-06-30 NOTE — Progress Notes (Signed)
  THERAPIST PROGRESS NOTE  Virtual Visit via Video Note  I connected with Barbara Pennington on 06/30/23 at 12:00 PM EDT by a video enabled telemedicine application and verified that I am speaking with the correct person using two identifiers.  Location: Patient: Home Provider: Remote office   I discussed the limitations of evaluation and management by telemedicine and the availability of in person appointments. The patient expressed understanding and agreed to proceed.   I discussed the assessment and treatment plan with the patient. The patient was provided an opportunity to ask questions and all were answered. The patient agreed with the plan and demonstrated an understanding of the instructions.   The patient was advised to call back or seek an in-person evaluation if the symptoms worsen or if the condition fails to improve as anticipated.  I provided 60 minutes of non-face-to-face time during this encounter. Barbara Pennington, [facility]  Session Time: 12:00 PM - 1:00 PM  Participation Level: Active  Behavioral Response: Well Groomed, Alert, Anxious and Dysphoric  Type of Therapy: Group Therapy  Interventions: CBT and Supportive  Summary: Barbara Pennington is a 43 y.o. female who presents with a history of anxiety, depression, skin picking disorder, and BPD. She appeared anxious but oriented x5. She shared with the group her struggles with skin-picking disorder. Barbara Pennington was tearful but receptive to support from other group members. She shared thought processes as she struggled with the goal of urge surfing to not pick her skin. She reported she needs to continue working on multiple skills and goals, including thinking before reacting, GIVE skill, and urge surfing for skin picking.   Therapist Response: Conducted telehealth group session. Began session with introductions and review of group rules and expectations. Engaged group members in mindfulness exercise - guided meditation for setting  boundaries. Conducted brief check-in with each group member. Reviewed handouts and worksheets - Interpersonal effectiveness - GIVE skill. Passenger transport manager with each group member and goal setting between now and next session.   Suicidal/Homicidal: No  Diagnosis: Borderline personality disorder (HCC)  Skin-picking disorder  Patient/Guardian was advised Release of Information must be obtained prior to any record release in order to collaborate their care with an outside provider. Patient/Guardian was advised if they have not already done so to contact the registration department to sign all necessary forms in order for us  to release information regarding their care.   Consent: Patient/Guardian gives verbal consent for treatment and assignment of benefits for services provided during this visit. Patient/Guardian expressed understanding and agreed to proceed.   Barbara Pennington, [facility] 06/30/2023

## 2023-07-03 ENCOUNTER — Other Ambulatory Visit (HOSPITAL_COMMUNITY): Payer: Self-pay

## 2023-07-04 ENCOUNTER — Other Ambulatory Visit (HOSPITAL_COMMUNITY): Payer: Self-pay

## 2023-07-04 ENCOUNTER — Telehealth: Payer: Self-pay | Admitting: Family Medicine

## 2023-07-04 NOTE — Telephone Encounter (Unsigned)
 Copied from CRM [id number]. Topic: Clinical - Medication Refill >> Jul 04, 2023  1:30 PM Yolanda T wrote: Medication: Semaglutide -Weight Management 0.25 MG/0.5ML   Has the patient contacted their pharmacy? Yes Contact provider for refills. Patient says she need the next dosage or a refill. Patients next injection is Sunday  This is the patient's preferred pharmacy:  [id number] - [place], [place] - [address] [place] [address] Phone: [phone] Fax: [phone]  Is this the correct pharmacy for this prescription? Yes  Has the prescription been filled recently? Yes  Is the patient out of the medication? Yes  Has the patient been seen for an appointment in the last year OR does the patient have an upcoming appointment? Yes  Can we respond through MyChart? Yes  Agent: Please be advised that Rx refills may take up to 3 business days. We ask that you follow-up with your pharmacy.

## 2023-07-05 ENCOUNTER — Ambulatory Visit (INDEPENDENT_AMBULATORY_CARE_PROVIDER_SITE_OTHER): Admitting: Professional Counselor

## 2023-07-05 DIAGNOSIS — F603 Borderline personality disorder: Secondary | ICD-10-CM | POA: Diagnosis not present

## 2023-07-05 DIAGNOSIS — F411 Generalized anxiety disorder: Secondary | ICD-10-CM | POA: Diagnosis not present

## 2023-07-05 DIAGNOSIS — F424 Excoriation (skin-picking) disorder: Secondary | ICD-10-CM | POA: Diagnosis not present

## 2023-07-05 DIAGNOSIS — F4310 Post-traumatic stress disorder, unspecified: Secondary | ICD-10-CM

## 2023-07-07 NOTE — Progress Notes (Signed)
  THERAPIST PROGRESS NOTE  Virtual Visit via Video Note  I connected with Barbara Pennington on 07/07/23 at 12:00 PM EDT by a video enabled telemedicine application and verified that I am speaking with the correct person using two identifiers.  Location: Patient: Home Provider: Remote office   I discussed the limitations of evaluation and management by telemedicine and the availability of in person appointments. The patient expressed understanding and agreed to proceed.   I discussed the assessment and treatment plan with the patient. The patient was provided an opportunity to ask questions and all were answered. The patient agreed with the plan and demonstrated an understanding of the instructions.   The patient was advised to call back or seek an in-person evaluation if the symptoms worsen or if the condition fails to improve as anticipated.  I provided 60 minutes of non-face-to-face time during this encounter. Barbara Pennington, [facility]  Session Time: 12:00 PM - 1:00 PM   Participation Level: Active  Behavioral Response: Well Groomed, Alert, Anxious and Dysphoric  Type of Therapy: Group Therapy  Interventions: CBT and Supportive  Summary: Barbara Pennington is a 43 y.o. female who presents with a history of anxiety, PTSD, skin picking disorder, and BPD. She appeared alert and oriented x5. She stated she had an overwhelming and emotional week. She attempted to work on goals and shared the progression of thoughts as she moved through the week. She stated her goal for this week is to move with more positivity throughout the day.   Therapist Response: Conducted telehealth group session. Began session with introductions and review of group rules and expectations. Engaged group members in mindfulness exercise - guided meditation for . Conducted brief check-in with each group member. Reviewed handouts and worksheets - . Passenger transport manager with each group member and goal setting between now and  next session.   Suicidal/Homicidal: No  Diagnosis: Borderline personality disorder (HCC)  Skin-picking disorder  GAD (generalized anxiety disorder)  PTSD (post-traumatic stress disorder)  Patient/Guardian was advised Release of Information must be obtained prior to any record release in order to collaborate their care with an outside provider. Patient/Guardian was advised if they have not already done so to contact the registration department to sign all necessary forms in order for us  to release information regarding their care.   Consent: Patient/Guardian gives verbal consent for treatment and assignment of benefits for services provided during this visit. Patient/Guardian expressed understanding and agreed to proceed.   Barbara Pennington, [facility] 07/07/2023

## 2023-07-10 ENCOUNTER — Ambulatory Visit (INDEPENDENT_AMBULATORY_CARE_PROVIDER_SITE_OTHER): Admitting: Student in an Organized Health Care Education/Training Program

## 2023-07-10 ENCOUNTER — Ambulatory Visit
Admission: RE | Admit: 2023-07-10 | Discharge: 2023-07-10 | Disposition: A | Discharge status: Home / Self Care | Source: Ambulatory Visit | Attending: Student in an Organized Health Care Education/Training Program | Admitting: Student in an Organized Health Care Education/Training Program

## 2023-07-10 ENCOUNTER — Encounter: Payer: Self-pay | Admitting: Student in an Organized Health Care Education/Training Program

## 2023-07-10 ENCOUNTER — Ambulatory Visit (HOSPITAL_BASED_OUTPATIENT_CLINIC_OR_DEPARTMENT_OTHER): Admitting: Student in an Organized Health Care Education/Training Program

## 2023-07-10 VITALS — BP 118/80 | HR 74 | Temp 97.1°F | Ht 65.0 in | Wt 338.4 lb

## 2023-07-10 DIAGNOSIS — M25562 Pain in left knee: Secondary | ICD-10-CM | POA: Diagnosis not present

## 2023-07-10 DIAGNOSIS — J4530 Mild persistent asthma, uncomplicated: Secondary | ICD-10-CM

## 2023-07-10 DIAGNOSIS — G8929 Other chronic pain: Secondary | ICD-10-CM | POA: Insufficient documentation

## 2023-07-10 DIAGNOSIS — M1712 Unilateral primary osteoarthritis, left knee: Secondary | ICD-10-CM | POA: Insufficient documentation

## 2023-07-10 DIAGNOSIS — R0602 Shortness of breath: Secondary | ICD-10-CM | POA: Diagnosis not present

## 2023-07-10 DIAGNOSIS — G4733 Obstructive sleep apnea (adult) (pediatric): Secondary | ICD-10-CM | POA: Diagnosis not present

## 2023-07-10 DIAGNOSIS — Z87891 Personal history of nicotine dependence: Secondary | ICD-10-CM

## 2023-07-10 LAB — NITRIC OXIDE: Nitric Oxide: 23

## 2023-07-10 MED ORDER — DEXAMETHASONE SODIUM PHOSPHATE 10 MG/ML IJ SOLN
10.0000 mg | Freq: Once | INTRAMUSCULAR | Status: AC
  Administered 2023-07-10: 10 mg

## 2023-07-10 MED ORDER — ROPIVACAINE HCL 2 MG/ML IJ SOLN
INTRAMUSCULAR | Status: AC
Start: 2023-07-10 — End: 2023-07-10
  Filled 2023-07-10: qty 20

## 2023-07-10 MED ORDER — LIDOCAINE HCL 2 % IJ SOLN
20.0000 mL | Freq: Once | INTRAMUSCULAR | Status: AC
  Administered 2023-07-10: 400 mg

## 2023-07-10 MED ORDER — AEROCHAMBER MV MISC: 0 refills | Status: DC

## 2023-07-10 MED ORDER — DIAZEPAM 5 MG PO TABS
10.0000 mg | ORAL_TABLET | ORAL | Status: AC
  Administered 2023-07-10: 10 mg via ORAL

## 2023-07-10 MED ORDER — ROPIVACAINE HCL 2 MG/ML IJ SOLN
9.0000 mL | Freq: Once | INTRAMUSCULAR | Status: AC
  Administered 2023-07-10: 9 mL via PERINEURAL

## 2023-07-10 MED ORDER — LIDOCAINE HCL 2 % IJ SOLN
INTRAMUSCULAR | Status: AC
  Filled 2023-07-10: qty 20

## 2023-07-10 MED ORDER — DEXAMETHASONE SODIUM PHOSPHATE 10 MG/ML IJ SOLN
INTRAMUSCULAR | Status: AC
  Filled 2023-07-10: qty 1

## 2023-07-10 MED ORDER — BUDESONIDE-FORMOTEROL FUMARATE 160-4.5 MCG/ACT IN AERO: 2.0000 | INHALATION_SPRAY | Freq: Two times a day (BID) | RESPIRATORY_TRACT | 12 refills | Status: AC

## 2023-07-10 MED ORDER — DIAZEPAM 5 MG PO TABS
ORAL_TABLET | ORAL | Status: AC
  Filled 2023-07-10: qty 2

## 2023-07-10 NOTE — Progress Notes (Signed)
 PROVIDER NOTE: Interpretation of information contained herein should be left to medically-trained personnel. Specific patient instructions are provided elsewhere under Patient Instructions section of medical record. This document was created in part using STT-dictation technology, any transcriptional errors that may result from this process are unintentional.  Patient: Barbara Pennington Type: Established DOB: Mar 13, 1980 MRN: [id number] PCP: Sowles, Krichna, MD  Service: Procedure DOS: 07/10/2023 Setting: Ambulatory Location: Ambulatory outpatient facility Delivery: Face-to-face Provider: Wallie Sherry, MD Specialty: Interventional Pain Management Specialty designation: 09 Location: Outpatient facility Ref. Prov.: Patel, Seema K, NP       Interventional Therapy   Procedure:          Type: Genicular Nerves Block (Superolateral, Superomedial, and Inferomedial Genicular Nerves)  #2  Laterality: Left (-LT)  Level: Superior and inferior to the knee joint.  Imaging: Fluoroscopic guidance Anesthesia: Local anesthesia (1-2% Lidocaine ) Sedation: Minimal Sedation                       DOS: 07/10/2023  Performed by: Barbara Sherry, MD  Purpose: Diagnostic/Therapeutic Indications: Chronic knee pain severe enough to impact quality of life or function. Rationale (medical necessity): procedure needed and proper for the diagnosis and/or treatment of Barbara Pennington's medical symptoms and needs. 1. Arthritis of left knee   2. Chronic pain of left knee    NAS-11 Pain score:   Pre-procedure: 5 /10   Post-procedure: 0-No pain/10     Target: For Genicular Nerve block(s), the targets are: the superolateral genicular nerve, located in the lateral distal portion of the femoral shaft as it curves to form the lateral epicondyle, in the region of the distal femoral metaphysis; the superomedial genicular nerve, located in the medial distal portion of the femoral shaft as it curves to form the medial epicondyle; and the  inferomedial genicular nerve, located in the medial, proximal portion of the tibial shaft, as it curves to form the medial epicondyle, in the region of the proximal tibial metaphysis.  Location: Superolateral, Superomedial, and Inferomedial aspects of knee joint.  Region: Lateral, Anterior, and Medial aspects of the knee joint, above and below the knee joint proper. Approach: Percutaneous  Type of procedure: Percutaneous perineural nerve block. The genicular nerve block is a motor-sparing technique that anesthetizes the sensory terminal branches innervating the knee joint, resulting in anesthesia of the anterior compartment of the knee. The distribution of anesthesia of each nerve is mostly in the corresponding quadrant.  Neuroanatomy: The superolateral genicular nerve (SLGN) courses around the femur shaft to pass between the vastus lateralis and the lateral epicondyle. It accompanies the superior lateral genicular artery. The superomedial genicular nerve (SMGN) courses around the femur shaft, following the superior medial genicular artery, to pass between the adductor magnus tendon and the medial epicondyle below the vastus medialis. The inferolateral genicular nerve (ILGN) courses around the tibial lateral epicondyle deep to the lateral collateral ligament, following the inferior lateral genicular artery, superior of the fibula head. The inferomedial genicular nerve (IMGN) courses horizontally below the medial collateral ligament between the tibial medial epicondyle and the insertion of the collateral ligament. It accompanies the inferior medial genicular artery. The recurrent peroneal nerve originates in the inferior popliteal region from the common peroneal nerve and courses horizontally around the fibula to pass just inferior of the fibula head and travel superior to the anterolateral tibial epicondyle. It accompanies the recurrent tibial artery.  Position / Prep / Materials:  Position: Supine,  Modified Fowler's position with pillows under the targeted  knee(s). The patient is placed in a supine position with the knee slightly flexed by placing a pillow in the popliteal fossa. Prep solution: ChloraPrep (2% chlorhexidine  gluconate and 70% isopropyl alcohol) Prep Area: Entire knee area, from mid-thigh to mid-shin, lateral, anterior, and medial aspects. Materials:  Tray: Block Needle(s):  Type: Spinal  Gauge (G): 22  Length: 3.5-in  Qty: 3  H&P (Pre-op Assessment):  Barbara Pennington is a 43 y.o. (year old), female patient, seen today for interventional treatment. She  has a past surgical history that includes Tonsillectomy (01/08/1982); Cesarean section (01/08/2005); Gum surgery (01/08/2002); Abdominal hysterectomy (01/08/2006); Plantar fascia release (Left, 08/19/2014); Breast biopsy (Left, 01/09/2011); Colonoscopy with propofol  (N/A, 10/09/2016); Colonoscopy with propofol  (N/A, 11/09/2019); Esophagogastroduodenoscopy; and Knee arthroscopy with medial menisectomy (Right, 07/24/2021). Barbara Pennington has a current medication list which includes the following prescription(s): acetaminophen , airsupra , amlodipine , atorvastatin , budesonide -formoterol , bupropion , buspirone , celecoxib , desloratadine , escitalopram , fluticasone , gabapentin , melatonin, modafinil , montelukast , aerochamber mv, and valsartan -hydrochlorothiazide . Her primarily concern today is the Knee Pain (left)  Initial Vital Signs:  Pulse/HCG Rate: (!) 58  Temp: 97.7 F (36.5 C) Resp: 16 BP: (!) 115/55 SpO2: 99 %  BMI: Estimated body mass index is 56.25 kg/m as calculated from the following:   Height as of this encounter: 5' 5 (1.651 m).   Weight as of this encounter: 338 lb (153.3 kg).  Risk Assessment: Allergies: Reviewed. She is allergic to wound dressing adhesive, latex, and metformin and related.  Allergy Precautions: None required Coagulopathies: Reviewed. None identified.  Blood-thinner therapy: None at this time Active  Infection(s): Reviewed. None identified. Barbara Pennington is afebrile  Site Confirmation: Barbara Pennington was asked to confirm the procedure and laterality before marking the site Procedure checklist: Completed Consent: Before the procedure and under the influence of no sedative(s), amnesic(s), or anxiolytics, the patient was informed of the treatment options, risks and possible complications. To fulfill our ethical and legal obligations, as recommended by the American Medical Association's Code of Ethics, I have informed the patient of my clinical impression; the nature and purpose of the treatment or procedure; the risks, benefits, and possible complications of the intervention; the alternatives, including doing nothing; the risk(s) and benefit(s) of the alternative treatment(s) or procedure(s); and the risk(s) and benefit(s) of doing nothing. The patient was provided information about the general risks and possible complications associated with the procedure. These may include, but are not limited to: failure to achieve desired goals, infection, bleeding, organ or nerve damage, allergic reactions, paralysis, and death. In addition, the patient was informed of those risks and complications associated to the procedure, such as failure to decrease pain; infection; bleeding; organ or nerve damage with subsequent damage to sensory, motor, and/or autonomic systems, resulting in permanent pain, numbness, and/or weakness of one or several areas of the body; allergic reactions; (i.e.: anaphylactic reaction); and/or death. Furthermore, the patient was informed of those risks and complications associated with the medications. These include, but are not limited to: allergic reactions (i.e.: anaphylactic or anaphylactoid reaction(s)); adrenal axis suppression; blood sugar elevation that in diabetics may result in ketoacidosis or comma; water retention that in patients with history of congestive heart failure may result in  shortness of breath, pulmonary edema, and decompensation with resultant heart failure; weight gain; swelling or edema; medication-induced neural toxicity; particulate matter embolism and blood vessel occlusion with resultant organ, and/or nervous system infarction; and/or aseptic necrosis of one or more joints. Finally, the patient was informed that Medicine is not an exact science; therefore, there is also  the possibility of unforeseen or unpredictable risks and/or possible complications that may result in a catastrophic outcome. The patient indicated having understood very clearly. We have given the patient no guarantees and we have made no promises. Enough time was given to the patient to ask questions, all of which were answered to the patient's satisfaction. Ms. Iovine has indicated that she wanted to continue with the procedure. Attestation: I, the ordering provider, attest that I have discussed with the patient the benefits, risks, side-effects, alternatives, likelihood of achieving goals, and potential problems during recovery for the procedure that I have provided informed consent. Date  Time: 07/10/2023  1:58 PM  Pre-Procedure Preparation:  Monitoring: As per clinic protocol. Respiration, ETCO2, SpO2, BP, heart rate and rhythm monitor placed and checked for adequate function Safety Precautions: Patient was assessed for positional comfort and pressure points before starting the procedure. Time-out: I initiated and conducted the Time-out before starting the procedure, as per protocol. The patient was asked to participate by confirming the accuracy of the Time Out information. Verification of the correct person, site, and procedure were performed and confirmed by me, the nursing staff, and the patient. Time-out conducted as per Joint Commission's Universal Protocol (UP.01.01.01). Time: 1430 Start Time: 1430 hrs.  Description/Narrative of Procedure:          Rationale (medical necessity):  procedure needed and proper for the diagnosis and/or treatment of the patient's medical symptoms and needs. Procedural Technique Safety Precautions: Aspiration looking for blood return was conducted prior to all injections. At no point did we inject any substances, as a needle was being advanced. No attempts were made at seeking any paresthesias. Safe injection practices and needle disposal techniques used. Medications properly checked for expiration dates. SDV (single dose vial) medications used. Description of the Procedure: Protocol guidelines were followed. The patient was assisted into a comfortable position. The target area was identified and the area prepped in the usual manner. Skin & deeper tissues infiltrated with local anesthetic. Appropriate amount of time allowed to pass for local anesthetics to take effect. The procedure needles were then advanced to the target area. Proper needle placement secured. Negative aspiration confirmed. Solution injected in intermittent fashion, asking for systemic symptoms every 0.5cc of injectate. The needles were then removed and the area cleansed, making sure to leave some of the prepping solution back to take advantage of its long term bactericidal properties.             Vitals:   07/10/23 1407 07/10/23 1430 07/10/23 1437  BP: (!) 115/55 (!) 126/51 (!) 130/55  Pulse: (!) 58 65 65  Resp: 16 16 17   Temp: 97.7 F (36.5 C)    SpO2: 99% 100% 100%  Weight: (!) 338 lb (153.3 kg)    Height: 5' 5 (1.651 m)       Start Time: 1430 hrs. End Time: 1434 hrs.  Imaging Guidance (Non-Spinal):          Type of Imaging Technique: Fluoroscopy Guidance (Non-Spinal) Indication(s): Fluoroscopy guidance for needle placement to enhance accuracy in procedures requiring precise needle localization for targeted delivery of medication in or near specific anatomical locations not easily accessible without such real-time imaging assistance. Exposure Time: Please see nurses  notes. Contrast: None used. Fluoroscopic Guidance: I was personally present during the use of fluoroscopy. Tunnel Vision Technique used to obtain the best possible view of the target area. Parallax error corrected before commencing the procedure. Direction-depth-direction technique used to introduce the needle under continuous pulsed  fluoroscopy. Once target was reached, antero-posterior, oblique, and lateral fluoroscopic projection used confirm needle placement in all planes. Images permanently stored in EMR. Interpretation: No contrast injected. I personally interpreted the imaging intraoperatively. Adequate needle placement confirmed in multiple planes. Permanent images saved into the patient's record.  Post-operative Assessment:  Post-procedure Vital Signs:  Pulse/HCG Rate: 65  Temp: 97.7 F (36.5 C) Resp: 17 BP: (!) 130/55 SpO2: 100 %  EBL: None  Complications: No immediate post-treatment complications observed by team, or reported by patient.  Note: The patient tolerated the entire procedure well. A repeat set of vitals were taken after the procedure and the patient was kept under observation following institutional policy, for this type of procedure. Post-procedural neurological assessment was performed, showing return to baseline, prior to discharge. The patient was provided with post-procedure discharge instructions, including a section on how to identify potential problems. Should any problems arise concerning this procedure, the patient was given instructions to immediately contact us , at any time, without hesitation. In any case, we plan to contact the patient by telephone for a follow-up status report regarding this interventional procedure.  Comments:  No additional relevant information.  Plan of Care (POC)  Orders:  Orders Placed This Encounter  Procedures   Radiofrequency,Genicular    Standing Status:   Future    Expiration Date:   10/10/2023    Scheduling  Instructions:     Procedure: Genicular nerve(s) Cooled Radiofrequency Ablation (CRFA)     Side(s): LEFT     Level(s): Superior-Lateral, Superior-Medial, and Inferior-Medial Genicular Nerve(s)     Sedation: WITH     Scheduling Timeframe: As soon as pre-approved    Where will this procedure be performed?:   [facility]   [facility] C-ARM 1-60 MIN NO REPORT    Intraoperative interpretation by procedural physician at [facility].    Standing Status:   Standing    Number of Occurrences:   1    Reason for exam::   Assistance in needle guidance and placement for procedures requiring needle placement in or near specific anatomical locations not easily accessible without such assistance.    Medications ordered for procedure: Meds ordered this encounter  Medications   lidocaine  (XYLOCAINE ) 2 % (with pres) injection 400 mg   diazepam (VALIUM) tablet 10 mg    Make sure Flumazenil is available in the pyxis when using this medication. If oversedation occurs, administer 0.2 mg IV over 15 sec. If after 45 sec no response, administer 0.2 mg again over 1 min; may repeat at 1 min intervals; not to exceed 4 doses (1 mg)   ropivacaine  (PF) 2 mg/mL (0.2%) (NAROPIN ) injection 9 mL   dexamethasone  (DECADRON ) injection 10 mg   Medications administered: We administered lidocaine , diazepam, ropivacaine  (PF) 2 mg/mL (0.2%), and dexamethasone .  See the medical record for exact dosing, route, and time of administration.    Left GNB 05/27/23, 07/10/23    Follow-up plan:   Return in about 5 weeks (around 08/14/2023) for left knee RFA ECT.     Recent Visits Date Type Provider Dept  06/27/23 Office Visit Patel, Seema K, NP [facility]  05/27/23 Procedure visit Marcelino Nurse, MD [facility]  05/09/23 Office Visit Marcelino Nurse, MD [facility]  Showing recent visits within past 90 days and meeting all other requirements Today's Visits Date Type Provider Dept   07/10/23 Procedure visit Marcelino Nurse, MD [facility]  Showing today's visits and meeting all other requirements  Future Appointments No visits were found meeting these conditions. Showing future appointments within next 90 days and meeting all other requirements   Disposition: Discharge home  Discharge (Date  Time): 07/10/2023;   hrs.   Primary Care Physician: Sowles, Krichna, MD Location: [facility] Note by: Barbara Sherry, MD (TTS technology used. I apologize for any typographical errors that were not detected and corrected.) Date: 07/10/2023; Time: 2:41 PM  Disclaimer:  Medicine is not an Visual merchandiser. The only guarantee in medicine is that nothing is guaranteed. It is important to note that the decision to proceed with this intervention was based on the information collected from the patient. The Data and conclusions were drawn from the patient's questionnaire, the interview, and the physical examination. Because the information was provided in large part by the patient, it cannot be guaranteed that it has not been purposely or unconsciously manipulated. Every effort has been made to obtain as much relevant data as possible for this evaluation. It is important to note that the conclusions that lead to this procedure are derived in large part from the available data. Always take into account that the treatment will also be dependent on availability of resources and existing treatment guidelines, considered by other Pain Management Practitioners as being common knowledge and practice, at the time of the intervention. For Medico-Legal purposes, it is also important to point out that variation in procedural techniques and pharmacological choices are the acceptable norm. The indications, contraindications, technique, and results of the above procedure should only be interpreted and judged by a Board-Certified Interventional Pain Specialist with extensive  familiarity and expertise in the same exact procedure and technique.

## 2023-07-10 NOTE — Patient Instructions (Addendum)
____________________________________________________________________________________________  Post-Procedure Discharge Instructions  Instructions: Apply ice:  Purpose: This will minimize any swelling and discomfort after procedure.  When: Day of procedure, as soon as you get home. How: Fill a plastic sandwich bag with crushed ice. Cover it with a small towel and apply to injection site. How long: (15 min on, 15 min off) Apply for 15 minutes then remove x 15 minutes.  Repeat sequence on day of procedure, until you go to bed. Apply heat:  Purpose: To treat any soreness and discomfort from the procedure. When: Starting the next day after the procedure. How: Apply heat to procedure site starting the day following the procedure. How long: May continue to repeat daily, until discomfort goes away. Food intake: Start with clear liquids (like water) and advance to regular food, as tolerated.  Physical activities: Keep activities to a minimum for the first 8 hours after the procedure. After that, then as tolerated. Driving: If you have received any sedation, be responsible and do not drive. You are not allowed to drive for 24 hours after having sedation. Blood thinner: (Applies only to those taking blood thinners) You may restart your blood thinner 6 hours after your procedure. Insulin: (Applies only to Diabetic patients taking insulin) As soon as you can eat, you may resume your normal dosing schedule. Infection prevention: Keep procedure site clean and dry. Shower daily and clean area with soap and water. Post-procedure Pain Diary: Extremely important that this be done correctly and accurately. Recorded information will be used to determine the next step in treatment. For the purpose of accuracy, follow these rules: Evaluate only the area treated. Do not report or include pain from an untreated area. For the purpose of this evaluation, ignore all other areas of pain, except for the treated  area. After your procedure, avoid taking a long nap and attempting to complete the pain diary after you wake up. Instead, set your alarm clock to go off every hour, on the hour, for the initial 8 hours after the procedure. Document the duration of the numbing medicine, and the relief you are getting from it. Do not go to sleep and attempt to complete it later. It will not be accurate. If you received sedation, it is likely that you were given a medication that may cause amnesia. Because of this, completing the diary at a later time may cause the information to be inaccurate. This information is needed to plan your care. Follow-up appointment: Keep your post-procedure follow-up evaluation appointment after the procedure (usually 2 weeks for most procedures, 6 weeks for radiofrequencies). DO NOT FORGET to bring you pain diary with you.   Expect: (What should I expect to see with my procedure?) From numbing medicine (AKA: Local Anesthetics): Numbness or decrease in pain. You may also experience some weakness, which if present, could last for the duration of the local anesthetic. Onset: Full effect within 15 minutes of injected. Duration: It will depend on the type of local anesthetic used. On the average, 1 to 8 hours.  From steroids (Applies only if steroids were used): Decrease in swelling or inflammation. Once inflammation is improved, relief of the pain will follow. Onset of benefits: Depends on the amount of swelling present. The more swelling, the longer it will take for the benefits to be seen. In some cases, up to 10 days. Duration: Steroids will stay in the system x 2 weeks. Duration of benefits will depend on multiple posibilities including persistent irritating factors. Side-effects: If present, they  may typically last 2 weeks (the duration of the steroids). Frequent: Cramps (if they occur, drink Gatorade and take over-the-counter Magnesium 450-500 mg once to twice a day); water retention with  temporary weight gain; increases in blood sugar; decreased immune system response; increased appetite. Occasional: Facial flushing (red, warm cheeks); mood swings; menstrual changes. Uncommon: Long-term decrease or suppression of natural hormones; bone thinning. (These are more common with higher doses or more frequent use. This is why we prefer that our patients avoid having any injection therapies in other practices.)  Very Rare: Severe mood changes; psychosis; aseptic necrosis. From procedure: Some discomfort is to be expected once the numbing medicine wears off. This should be minimal if ice and heat are applied as instructed.  Call if: (When should I call?) You experience numbness and weakness that gets worse with time, as opposed to wearing off. New onset bowel or bladder incontinence. (Applies only to procedures done in the spine)  Emergency Numbers: Durning business hours (Monday - Thursday, 8:00 AM - 4:00 PM) (Friday, 9:00 AM - 12:00 Noon): [phone] After hours: [phone] NOTE: If you are having a problem and are unable connect with, or to talk to a provider, then go to your nearest urgent care or emergency department. If the problem is serious and urgent, please call 911. ____________________________________________________________________________________________   Radiofrequency Ablation Radiofrequency ablation is a procedure that is performed to relieve pain. The procedure is often used for back, neck, or arm pain. Radiofrequency ablation involves the use of a machine that creates radio waves to make heat. During the procedure, the heat is applied to the nerve that carries the pain signal. The heat damages the nerve and interferes with the pain signal. Pain relief usually starts about 2 weeks after the procedure and lasts for 6 months to 1 year. Tell a health care provider about: Any allergies you have. All medicines you are taking, including vitamins, herbs, eye drops,  creams, and over-the-counter medicines. Any problems you or family members have had with anesthetic medicines. Any bleeding problems you have. Any surgeries you have had. Any medical conditions you have. Whether you are pregnant or may be pregnant. What are the risks? Generally, this is a safe procedure. However, problems may occur, including: Pain or soreness at the injection site. Allergic reaction to medicines given during the procedure. Bleeding. Infection at the injection site. Damage to nerves or blood vessels. What happens before the procedure? When to stop eating and drinking Follow instructions from your health care provider about what you may eat and drink before your procedure. These may include: 8 hours before the procedure Stop eating most foods. Do not eat meat, fried foods, or fatty foods. Eat only light foods, such as toast or crackers. All liquids are okay except energy drinks and alcohol. 6 hours before the procedure Stop eating. Drink only clear liquids, such as water, clear fruit juice, black coffee, plain tea, and sports drinks. Do not drink energy drinks or alcohol. 2 hours before the procedure Stop drinking all liquids. You may be allowed to take medicine with small sips of water. If you do not follow your health care provider's instructions, your procedure may be delayed or canceled. Medicines Ask your health care provider about: Changing or stopping your regular medicines. This is especially important if you are taking diabetes medicines or blood thinners. Taking medicines such as aspirin and ibuprofen. These medicines can thin your blood. Do not take these medicines unless your health care provider tells  you to take them. Taking over-the-counter medicines, vitamins, herbs, and supplements. General instructions Ask your health care provider what steps will be taken to help prevent infection. These steps may include: Removing hair at the procedure  site. Washing skin with a germ-killing soap. Taking antibiotic medicine. If you will be going home right after the procedure, plan to have a responsible adult: Take you home from the hospital or clinic. You will not be allowed to drive. Care for you for the time you are told. What happens during the procedure?  You will be awake during the procedure. You will need to be able to talk with the health care provider during the procedure. An IV will be inserted into one of your veins. You will be given one or more of the following: A medicine to help you relax (sedative). A medicine to numb the area (local anesthetic). Your health care provider will insert a radiofrequency needle into the area to be treated. This is done with the help of fluoroscopy. A wire that carries the radio waves (electrode) will be put through the radiofrequency needle. An electrical pulse will be sent through the electrode to verify the correct nerve that is causing your pain. You will feel a tingling sensation, and you may have muscle twitching. The tissue around the needle tip will be heated by an electric current that comes from the radiofrequency machine. This will numb the nerves. The needle will be removed. A bandage (dressing) will be put on the insertion area. The procedure may vary among health care providers and hospitals. What happens after the procedure? Your blood pressure, heart rate, breathing rate, and blood oxygen level will be monitored until you leave the hospital or clinic. Return to your normal activities as told by your health care provider. Ask your health care provider what activities are safe for you. If you were given a sedative during the procedure, it can affect you for several hours. Do not drive or operate machinery until your health care provider says that it is safe. Summary Radiofrequency ablation is a procedure that is performed to relieve pain. The procedure is often used for back, neck,  or arm pain. Radiofrequency ablation involves the use of a machine that creates radio waves to make heat. Plan to have a responsible adult take you home from the hospital or clinic. Do not drive or operate machinery until your health care provider says that it is safe. Return to your normal activities as told by your health care provider. Ask your health care provider what activities are safe for you. This information is not intended to replace advice given to you by your health care provider. Make sure you discuss any questions you have with your health care provider. Document Revised: 06/14/2020 Document Reviewed: 06/14/2020 [employer] Patient Education  2024 [employer].

## 2023-07-10 NOTE — Progress Notes (Signed)
 Assessment & Plan:   #Moderate persistent asthma  Presents for follow-up in regards to her shortness of breath as well as asthma.  Pulmonary function testing from February 2024 shows normal spirometry, lung volumes, and DLCO.  On further close inspection of the PFTs, there appears to be some improvement in peak flows with bronchodilator challenge and mild bronchodilator response (does not meet ATS criteria).  There also appears to be an element of hyperinflation on lung volumes.  While the PFTs are normal, this could be suggestive of asthma, and her improved symptoms support this.  FeNO in clinic was 36 ppb in January, and repeated today is improved to a 23 ppb.  This suggests improved control of her asthma with ICS/LABA.  Her Symbicort  dose was stepped up in January which we will continue at this point.  I will also add a spacer device to her regimen.  - Weight loss encouraged - budesonide -formoterol  (SYMBICORT ) 160-4.5 MCG/ACT inhaler; Inhale 2 puffs into the lungs in the morning and at bedtime.  Dispense: 1 each; Refill: 12 - Spacer/Aero-Holding Chambers (AEROCHAMBER MV) inhaler; Use as instructed  Dispense: 1 each; Refill: 0  #OSA on CPAP  She has a history of sleep apnea and is maintained on CPAP therapy.  Will attempt to get compliance report during next visit.  Should her AHI remain elevated, will consider in-house split-night sleep study.  -Continue CPAP -Obtain compliance report next visit -Weight loss recommended  Return in about 6 months (around 01/10/2024).  I spent 32 minutes caring for this patient today, including preparing to see the patient, obtaining a medical history , reviewing a separately obtained history, performing a medically appropriate examination and/or evaluation, counseling and educating the patient/family/caregiver, ordering medications, tests, or procedures, documenting clinical information in the electronic health record, and independently interpreting results  (not separately reported/billed) and communicating results to the patient/family/caregiver  Belva November, MD [facility]  End of visit medications:  Meds ordered this encounter  Medications   budesonide -formoterol  (SYMBICORT ) 160-4.5 MCG/ACT inhaler    Sig: Inhale 2 puffs into the lungs in the morning and at bedtime.    Dispense:  1 each    Refill:  12   Spacer/Aero-Holding Chambers (AEROCHAMBER MV) inhaler    Sig: Use as instructed    Dispense:  1 each    Refill:  0     Current Outpatient Medications:    acetaminophen  (TYLENOL ) 500 MG tablet, Take 1,000 mg by mouth every 6 (six) hours as needed for moderate pain (pain score 4-6)., Disp: , Rfl:    Albuterol -Budesonide  (AIRSUPRA ) 90-80 MCG/ACT AERO, Inhale 2 puffs into the lungs 4 (four) times daily as needed., Disp: 10.7 g, Rfl: 1   amLODipine  (NORVASC ) 2.5 MG tablet, Take 1 tablet (2.5 mg total) by mouth daily., Disp: 90 tablet, Rfl: 1   atorvastatin  (LIPITOR) 20 MG tablet, Take 1 tablet (20 mg total) by mouth every morning., Disp: 90 tablet, Rfl: 1   buPROPion  (WELLBUTRIN  XL) 150 MG 24 hr tablet, TAKE 1 TABLET (150 MG TOTAL) BY MOUTH DAILY WITH BREAKFAST. STOP WELLBUTRIN  XL 300 MG, Disp: 90 tablet, Rfl: 0   busPIRone  (BUSPAR ) 30 MG tablet, TAKE 1 TABLET BY MOUTH 2 TIMES DAILY., Disp: 180 tablet, Rfl: 1   celecoxib  (CELEBREX ) 100 MG capsule, Take 1 capsule (100 mg total) by mouth 2 (two) times daily., Disp: 180 capsule, Rfl: 0   desloratadine  (CLARINEX ) 5 MG tablet, Take 1 tablet (5 mg total) by mouth daily., Disp: 90  tablet, Rfl: 1   escitalopram  (LEXAPRO ) 20 MG tablet, TAKE 1 TABLET BY MOUTH EVERY DAY, Disp: 90 tablet, Rfl: 1   fluticasone  (FLONASE ) 50 MCG/ACT nasal spray, Place 1 spray into both nostrils daily., Disp: 48 mL, Rfl: 1   gabapentin  (NEURONTIN ) 300 MG capsule, Take 1 capsule by mouth 3 (three) times daily., Disp: , Rfl:    Melatonin 10 MG CAPS, Take 10-20 mg by mouth at bedtime., Disp: , Rfl:     modafinil  (PROVIGIL ) 100 MG tablet, Take 1 tablet (100 mg total) by mouth daily., Disp: 90 tablet, Rfl: 0   montelukast  (SINGULAIR ) 10 MG tablet, Take 1 tablet (10 mg total) by mouth at bedtime., Disp: 90 tablet, Rfl: 1   Spacer/Aero-Holding Chambers (AEROCHAMBER MV) inhaler, Use as instructed, Disp: 1 each, Rfl: 0   valsartan -hydrochlorothiazide  (DIOVAN -HCT) 160-25 MG tablet, Take 1 tablet by mouth daily., Disp: 90 tablet, Rfl: 0   budesonide -formoterol  (SYMBICORT ) 160-4.5 MCG/ACT inhaler, Inhale 2 puffs into the lungs in the morning and at bedtime., Disp: 1 each, Rfl: 12   Subjective:   PATIENT ID: Barbara Pennington GENDER: female DOB: [birth year], MRN: [id number]  Chief Complaint  Patient presents with   Follow-up    DOE. No wheezing. Dry cough.    HPI  The patient is a 43 year old female presenting to clinic for follow-up.  Initial Visit 01/29/22: Patient reports that she experienced progressive nonproductive cough that is worse at night and is associated with exertional dyspnea.  Her symptoms have pretty much worsened since Thanksgiving 2023 but she does report shortness of breath with exertion for over 2 to 3 years now.  The cough is nonproductive, with no hemoptysis, and no associated chest pain or tenderness.  The patient feels no pleurisy, denies any night sweats or weight loss, and does not have any rashes.  There are no GI or GU symptoms associated with it and no worsening lower extremity edema. The shortness of breath is mostly exertional and was noticed by the patient as she started to gain more weight in 2019. She reports a personal history of asthma for which she had been given multiple inhalers. Last was Symbicort  that she reports using, without much improvement. She's also using an albuterol  rescue inhaler.  Acute Visit 02/05/2023: She was seen in the ED in January 2025 with worsening cough.  Chest x-ray showed a right lower lobe lung opacity for which she was treated with Augmentin   and azithromycin .  Initiated on Symbicort  and continued on Airsupra .  Return visit 07/10/2023: Presenting back for follow-up.  She feels well and is compliant with her inhalers.  She does notice increase in symptoms when she misses a dose.  Mostly, this is manifested in an increase in her shortness of breath.  She does not have any wheezing or chest tightness.  She continues to experience shortness of breath with exertion. She is compliant with CPAP therapy.   She reports a distant history of smoking and quit in 2016; she smoked a few cigarettes a day for around 10 years and has around 5 pack years of smoking history.  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  She has no significant medical problems otherwise.  She denies any occupational exposures.   Ancillary information including prior medications, full medical/surgical/family/social histories, and PFTs (when available) are listed below and have been reviewed.   Review of Systems  Constitutional:  Negative for chills, fever, malaise/fatigue and weight loss.  Respiratory:  Positive for shortness of breath (with exertion). Negative for cough, hemoptysis, sputum production and wheezing.   Cardiovascular:  Negative for chest pain, palpitations, claudication, leg swelling and PND.  Neurological:  Negative for dizziness.     Objective:   Vitals:   07/10/23 0900  BP: 118/80  Pulse: 74  Temp: (!) 97.1 F (36.2 C)  SpO2: 98%  Weight: (!) 338 lb 6.4 oz (153.5 kg)  Height: 5' 5 (1.651 m)   98% on RA BMI Readings from Last 3 Encounters:  07/10/23 56.31 kg/m  06/27/23 56.58 kg/m  05/31/23 56.00 kg/m   Wt Readings from Last 3 Encounters:  07/10/23 (!) 338 lb 6.4 oz (153.5 kg)  06/27/23 (!) 340 lb (154.2 kg)  05/31/23 (!) 336 lb 8 oz (152.6 kg)    Physical Exam Constitutional:      General: She is not in acute distress.    Appearance: Normal appearance. She is obese. She is not ill-appearing.   HENT:     Head: Normocephalic.     Mouth/Throat:     Mouth: Mucous membranes are moist.  Eyes:     Pupils: Pupils are equal, round, and reactive to light.  Cardiovascular:     Rate and Rhythm: Normal rate and regular rhythm.     Heart sounds: Normal heart sounds.  Pulmonary:     Effort: Pulmonary effort is normal. No respiratory distress.     Breath sounds: Normal breath sounds. No stridor. No wheezing or rales.  Abdominal:     General: There is distension.     Palpations: Abdomen is soft.  Musculoskeletal:     Right lower leg: No edema.     Left lower leg: No edema.  Skin:    General: Skin is warm.  Neurological:     General: No focal deficit present.     Mental Status: She is alert and oriented to person, place, and time. Mental status is at baseline.       Ancillary Information    Past Medical History:  Diagnosis Date   Anxiety    Arthritis 2006   rhuematoid - mild - no current issues   Asthma 2003   Borderline personality disorder (HCC)    Chronic kidney disease    stage 3   Complication of anesthesia    Usually has low temp after surgery.After hysterectomy temp was 94   Depression    GERD (gastroesophageal reflux disease)    History of kidney stones    Hyperlipidemia    Hypertension 2013   Lump or mass in breast 2013   RIGHT BREAST   Motion sickness    repeated amusement park rides   PTSD (post-traumatic stress disorder)    Scoliosis    no current issues   Shortness of breath dyspnea    secondary to weight    Sleep apnea    uses cpap   Ulcer 2001     Family History  Problem Relation Age of Onset   Obesity Mother    Diabetes Mother    High Cholesterol Mother    Hypertension Mother    Neuropathy Mother    Depression Mother    Anxiety disorder Mother    Asthma Mother    Obesity Father    Hypertension Father    High Cholesterol Father    Depression Father    Anxiety disorder Father    Seizures Brother    Testicular cancer Brother     Breast cancer Maternal Grandmother  Hypertension Maternal Grandmother    Aneurysm Maternal Grandmother    Stroke Maternal Grandmother    Hypertension Maternal Grandfather    High Cholesterol Maternal Grandfather    Heart disease Maternal Grandfather        has a pacemaker   Stroke Maternal Grandfather    Alzheimer's disease Paternal Grandmother    Dementia Paternal Grandmother    Colon cancer Paternal Grandfather    Mental illness Maternal Aunt    Suicidality Cousin      Past Surgical History:  Procedure Laterality Date   ABDOMINAL HYSTERECTOMY  01/08/2006   BREAST BIOPSY Left 01/09/2011   CORE - FIBROADENOMA VS PHYLLODES   CESAREAN SECTION  01/08/2005   COLONOSCOPY WITH PROPOFOL  N/A 10/09/2016   Procedure: COLONOSCOPY WITH PROPOFOL ;  Surgeon: Therisa Bi, MD;  Location: [facility] ENDOSCOPY;  Service: Gastroenterology;  Laterality: N/A;   COLONOSCOPY WITH PROPOFOL  N/A 11/09/2019   Procedure: COLONOSCOPY WITH PROPOFOL ;  Surgeon: Unk Corinn Skiff, MD;  Location: [facility] ENDOSCOPY;  Service: Gastroenterology;  Laterality: N/A;   ESOPHAGOGASTRODUODENOSCOPY     GUM SURGERY  01/08/2002   KNEE ARTHROSCOPY WITH MEDIAL MENISECTOMY Right 07/24/2021   Procedure: Right medial meniscus root repair and patella chondroplasty;  Surgeon: Tobie Priest, MD;  Location: [facility] ORS;  Service: Orthopedics;  Laterality: Right;   PLANTAR FASCIA RELEASE Left 08/19/2014   Procedure: ENDOSCOPIC PLANTAR FASCIOTOMY RELEASE L WITH TOPAZ;  Surgeon: Donnice Cory, DPM;  Location: [facility];  Service: Podiatry;  Laterality: Left;  LMA WITH POPLITEAL BLOCK   TONSILLECTOMY  01/08/1982    Social History   Socioeconomic History   Marital status: Divorced    Spouse name: Not on file   Number of children: 2   Years of education: 14   Highest education level: Bachelor's degree (e.g., BA, AB, BS)  Occupational History   Not on file  Tobacco Use   Smoking status: Former    Current packs/day: 0.00     Average packs/day: 0.3 packs/day for 10.0 years (2.5 ttl pk-yrs)    Types: Cigarettes    Start date: 07/04/2004    Quit date: 07/05/2014    Years since quitting: 9.0   Smokeless tobacco: Never  Vaping Use   Vaping status: Never Used  Substance and Sexual Activity   Alcohol use: Yes    Comment: rare   Drug use: No   Sexual activity: Yes    Partners: Male    Birth control/protection: Surgical    Comment: 2007  Other Topics Concern   Not on file  Social History Narrative   Not on file   Social Drivers of Health   Financial Resource Strain: High Risk (03/04/2023)   Overall Financial Resource Strain (CARDIA)    Difficulty of Paying Living Expenses: Hard  Food Insecurity: Food Insecurity Present (03/04/2023)   Hunger Vital Sign    Worried About Running Out of Food in the Last Year: Often true    Ran Out of Food in the Last Year: Sometimes true  Transportation Needs: No Transportation Needs (03/04/2023)   PRAPARE - Administrator, Civil Service (Medical): No    Lack of Transportation (Non-Medical): No  Physical Activity: Inactive (03/04/2023)   Exercise Vital Sign    Days of Exercise per Week: 0 days    Minutes of Exercise per Session: 90 min  Stress: Stress Concern Present (03/04/2023)   [employer] of Occupational Health - Occupational Stress Questionnaire    Feeling of Stress : Very much  Social Connections:  Moderately Isolated (03/04/2023)   Social Connection and Isolation Panel    Frequency of Communication with Friends and Family: Three times a week    Frequency of Social Gatherings with Friends and Family: Patient declined    Attends Religious Services: Never    Database administrator or Organizations: No    Attends Engineer, structural: Not on file    Marital Status: Living with partner  Intimate Partner Violence: Not At Risk (11/08/2022)   Humiliation, Afraid, Rape, and Kick questionnaire    Fear of Current or Ex-Partner: No    Emotionally  Abused: No    Physically Abused: No    Sexually Abused: No     Allergies  Allergen Reactions   Wound Dressing Adhesive    Latex Rash   Metformin And Related     diarrhea     CBC    Component Value Date/Time   WBC 8.3 01/22/2023 0849   RBC 4.46 01/22/2023 0849   HGB 13.7 01/22/2023 0849   HGB 13.5 08/26/2013 1034   HCT 41.0 01/22/2023 0849   HCT 41.9 08/26/2013 1034   PLT 365 01/22/2023 0849   PLT 183 08/26/2013 1034   MCV 91.9 01/22/2023 0849   MCV 95 08/26/2013 1034   MCH 30.7 01/22/2023 0849   MCHC 33.4 01/22/2023 0849   RDW 12.5 01/22/2023 0849   RDW 12.6 08/26/2013 1034   LYMPHSABS 2,511 12/15/2021 0828   LYMPHSABS 2.0 01/06/2013 1910   MONOABS 0.8 06/20/2020 2138   MONOABS 0.5 01/06/2013 1910   EOSABS 681 (H) 01/22/2023 0849   EOSABS 0.2 01/06/2013 1910   BASOSABS 91 01/22/2023 0849   BASOSABS 0.0 01/06/2013 1910    Pulmonary Functions Testing Results:    Latest Ref Rng & Units 02/13/2022    3:39 PM  PFT Results  FVC-Pre L 3.08   FVC-Predicted Pre % 82   FVC-Post L 3.25   FVC-Predicted Post % 86   Pre FEV1/FVC % % 83   Post FEV1/FCV % % 83   FEV1-Pre L 2.54   FEV1-Predicted Pre % 84   FEV1-Post L 2.69   DLCO uncorrected ml/min/mmHg 23.17   DLCO UNC% % 104   DLVA Predicted % 105   TLC L 6.07   TLC % Predicted % 118   RV % Predicted % 149     Outpatient Medications Prior to Visit  Medication Sig Dispense Refill   acetaminophen  (TYLENOL ) 500 MG tablet Take 1,000 mg by mouth every 6 (six) hours as needed for moderate pain (pain score 4-6).     Albuterol -Budesonide  (AIRSUPRA ) 90-80 MCG/ACT AERO Inhale 2 puffs into the lungs 4 (four) times daily as needed. 10.7 g 1   amLODipine  (NORVASC ) 2.5 MG tablet Take 1 tablet (2.5 mg total) by mouth daily. 90 tablet 1   atorvastatin  (LIPITOR) 20 MG tablet Take 1 tablet (20 mg total) by mouth every morning. 90 tablet 1   buPROPion  (WELLBUTRIN  XL) 150 MG 24 hr tablet TAKE 1 TABLET (150 MG TOTAL) BY MOUTH DAILY WITH  BREAKFAST. STOP WELLBUTRIN  XL 300 MG 90 tablet 0   busPIRone  (BUSPAR ) 30 MG tablet TAKE 1 TABLET BY MOUTH 2 TIMES DAILY. 180 tablet 1   celecoxib  (CELEBREX ) 100 MG capsule Take 1 capsule (100 mg total) by mouth 2 (two) times daily. 180 capsule 0   desloratadine  (CLARINEX ) 5 MG tablet Take 1 tablet (5 mg total) by mouth daily. 90 tablet 1   escitalopram  (LEXAPRO ) 20 MG tablet TAKE 1  TABLET BY MOUTH EVERY DAY 90 tablet 1   fluticasone  (FLONASE ) 50 MCG/ACT nasal spray Place 1 spray into both nostrils daily. 48 mL 1   gabapentin  (NEURONTIN ) 300 MG capsule Take 1 capsule by mouth 3 (three) times daily.     Melatonin 10 MG CAPS Take 10-20 mg by mouth at bedtime.     modafinil  (PROVIGIL ) 100 MG tablet Take 1 tablet (100 mg total) by mouth daily. 90 tablet 0   montelukast  (SINGULAIR ) 10 MG tablet Take 1 tablet (10 mg total) by mouth at bedtime. 90 tablet 1   valsartan -hydrochlorothiazide  (DIOVAN -HCT) 160-25 MG tablet Take 1 tablet by mouth daily. 90 tablet 0   budesonide -formoterol  (SYMBICORT ) 160-4.5 MCG/ACT inhaler Inhale 2 puffs into the lungs in the morning and at bedtime. 1 each 12   No facility-administered medications prior to visit.

## 2023-07-10 NOTE — Progress Notes (Signed)
 Safety precautions to be maintained throughout the outpatient stay will include: orient to surroundings, keep bed in low position, maintain call bell within reach at all times, provide assistance with transfer out of bed and ambulation.

## 2023-07-11 ENCOUNTER — Telehealth: Payer: Self-pay | Admitting: *Deleted

## 2023-07-11 NOTE — Telephone Encounter (Signed)
 Attempted to call for post procedure follow-up. Message left.

## 2023-07-18 ENCOUNTER — Ambulatory Visit (INDEPENDENT_AMBULATORY_CARE_PROVIDER_SITE_OTHER): Admitting: Professional Counselor

## 2023-07-18 DIAGNOSIS — F424 Excoriation (skin-picking) disorder: Secondary | ICD-10-CM

## 2023-07-18 DIAGNOSIS — F603 Borderline personality disorder: Secondary | ICD-10-CM

## 2023-07-18 DIAGNOSIS — F4310 Post-traumatic stress disorder, unspecified: Secondary | ICD-10-CM | POA: Diagnosis not present

## 2023-07-18 DIAGNOSIS — F411 Generalized anxiety disorder: Secondary | ICD-10-CM

## 2023-07-18 NOTE — Progress Notes (Signed)
  THERAPIST PROGRESS NOTE  Virtual Visit via Video Note  I connected with Barbara Pennington on 07/19/23 at  4:00 PM EDT by a video enabled telemedicine application and verified that I am speaking with the correct person using two identifiers.  Location: Patient: Home Provider: Office   I discussed the limitations of evaluation and management by telemedicine and the availability of in person appointments. The patient expressed understanding and agreed to proceed.   I discussed the assessment and treatment plan with the patient. The patient was provided an opportunity to ask questions and all were answered. The patient agreed with the plan and demonstrated an understanding of the instructions.   The patient was advised to call back or seek an in-person evaluation if the symptoms worsen or if the condition fails to improve as anticipated.  I provided 57 minutes of non-face-to-face time during this encounter. Barbara Pennington, [facility]  Session Time: 4:02 PM - 4:59 PM  Participation Level: Active  Behavioral Response: Well Groomed, Alert, Dysphoric  Type of Therapy: Individual Therapy  Treatment Goals addressed: Active Anxiety  LTG: I guess working through all the issues from the past. Learning how to better cope with stress.                Start:  02/05/23   End:  02/04/24   STG: I don't deal with stress well. I get overwhelmed. I shut down. I get angry. I can't communicate my thoughts. To improve knowledge of DBT skills to reduce sxs per self report over the next 12 weeks.    ProgressTowards Goals: Progressing  Interventions: CBT, Motivational Interviewing, and Supportive  Summary: Barbara Pennington is a 43 y.o. female who presents with a history of anxiety, depression, skin picking, BPD, and PTSD. She appeared somber but oriented x5. She stated she has been very stressed about work, noting things I can't control. She was happy to share she won twice gambling and was able to buy some  things for herself and her home. Graci is looking forward to her mom coming to visit, especially since she will be off work the whole time. 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 Earleen mentioned group and thoughts about her own children being triggered. She struggles with not judging herself for her choices.   Therapist Response: Conducted session with Elease. Began session with check-in/update since previous session. Utilized empathetic and reflective listening. Used open-ended questions to facilitate discussion and summarized thoughts/feelings. Discussed positive events and upcoming plans and explored negative emotions around Adriane's children. Scheduled additional appointment and concluded session.   Suicidal/Homicidal: No  Plan: Return again in 2 weeks.  Diagnosis: Borderline personality disorder (HCC)  GAD (generalized anxiety disorder)  PTSD (post-traumatic stress disorder)  Skin-picking disorder  Collaboration of Care: Medication Management AEB chart review  Patient/Guardian was advised Release of Information must be obtained prior to any record release in order to collaborate their care with an outside provider. Patient/Guardian was advised if they have not already done so to contact the registration department to sign all necessary forms in order for us  to release information regarding their care.   Consent: Patient/Guardian gives verbal consent for treatment and assignment of benefits for services provided during this visit. Patient/Guardian expressed understanding and agreed to proceed.   Barbara Pennington, [facility] 07/19/2023

## 2023-07-19 ENCOUNTER — Ambulatory Visit (INDEPENDENT_AMBULATORY_CARE_PROVIDER_SITE_OTHER): Admitting: Professional Counselor

## 2023-07-19 DIAGNOSIS — F4310 Post-traumatic stress disorder, unspecified: Secondary | ICD-10-CM | POA: Diagnosis not present

## 2023-07-19 DIAGNOSIS — F424 Excoriation (skin-picking) disorder: Secondary | ICD-10-CM | POA: Diagnosis not present

## 2023-07-19 DIAGNOSIS — F603 Borderline personality disorder: Secondary | ICD-10-CM | POA: Diagnosis not present

## 2023-07-19 NOTE — Progress Notes (Unsigned)
  THERAPIST PROGRESS NOTE  Virtual Visit via Video Note  I connected with Barbara Pennington on 07/21/23 at 12:00 PM EDT by a video enabled telemedicine application and verified that I am speaking with the correct person using two identifiers.  Location: Patient: Home Provider: Remote office   I discussed the limitations of evaluation and management by telemedicine and the availability of in person appointments. The patient expressed understanding and agreed to proceed.   I discussed the assessment and treatment plan with the patient. The patient was provided an opportunity to ask questions and all were answered. The patient agreed with the plan and demonstrated an understanding of the instructions.   The patient was advised to call back or seek an in-person evaluation if the symptoms worsen or if the condition fails to improve as anticipated.  I provided 58 minutes of non-face-to-face time during this encounter. Barbara Pennington, [facility]  Session Time: 12:00 PM - 12:58 PM   Participation Level: Active  Behavioral Response: Well Groomed, Alert, Dysphoric  Type of Therapy: Group Therapy  Interventions: CBT and Supportive  Summary: Barbara Pennington is a 43 y.o. female who presents with a history of anxiety, depression, PTSD, BPD, and skin picking disorder. She appeared alert and oriented x5. She stated progress on some goals and struggles on others. [name] reported work has been very stressful this week. She was supportive to other group members during session. She identified her goal this week as communicating with people she doesn't know.   Therapist Response: Conducted telehealth group session. Began session with introductions and review of group rules and expectations. Engaged group members in mindfulness exercise - guided meditation for . Conducted brief check-in with each group member. Reviewed handouts and worksheets - . Passenger transport manager with each group member and goal setting between now  and next session.   Suicidal/Homicidal: No  Diagnosis: Borderline personality disorder (HCC)  PTSD (post-traumatic stress disorder)  Skin-picking disorder  Patient/Guardian was advised Release of Information must be obtained prior to any record release in order to collaborate their care with an outside provider. Patient/Guardian was advised if they have not already done so to contact the registration department to sign all necessary forms in order for us  to release information regarding their care.   Consent: Patient/Guardian gives verbal consent for treatment and assignment of benefits for services provided during this visit. Patient/Guardian expressed understanding and agreed to proceed.   Barbara Pennington, [facility] 07/21/2023

## 2023-07-20 ENCOUNTER — Other Ambulatory Visit: Payer: Self-pay | Admitting: Family Medicine

## 2023-07-22 ENCOUNTER — Other Ambulatory Visit: Payer: Self-pay | Admitting: Psychiatry

## 2023-07-22 DIAGNOSIS — F4310 Post-traumatic stress disorder, unspecified: Secondary | ICD-10-CM

## 2023-07-26 ENCOUNTER — Ambulatory Visit (INDEPENDENT_AMBULATORY_CARE_PROVIDER_SITE_OTHER): Admitting: Professional Counselor

## 2023-07-26 DIAGNOSIS — F603 Borderline personality disorder: Secondary | ICD-10-CM | POA: Diagnosis not present

## 2023-07-26 DIAGNOSIS — F424 Excoriation (skin-picking) disorder: Secondary | ICD-10-CM

## 2023-07-28 ENCOUNTER — Encounter: Payer: Self-pay | Admitting: Family Medicine

## 2023-07-29 ENCOUNTER — Other Ambulatory Visit: Payer: Self-pay | Admitting: Family Medicine

## 2023-07-29 MED ORDER — WEGOVY 0.25 MG/0.5ML ~~LOC~~ SOAJ: 0.2500 mg | SUBCUTANEOUS | 0 refills | Status: DC

## 2023-07-29 MED ORDER — WEGOVY 0.5 MG/0.5ML ~~LOC~~ SOAJ: 0.5000 mg | SUBCUTANEOUS | 0 refills | Status: DC

## 2023-07-29 NOTE — Progress Notes (Signed)
   THERAPIST PROGRESS NOTE  Virtual Visit via Video Note  I connected with Barbara Pennington on 07/29/23 at 12:00 PM EDT by a video enabled telemedicine application and verified that I am speaking with the correct person using two identifiers.  Location: Patient: Home Provider: Remote office   I discussed the limitations of evaluation and management by telemedicine and the availability of in person appointments. The patient expressed understanding and agreed to proceed.   I discussed the assessment and treatment plan with the patient. The patient was provided an opportunity to ask questions and all were answered. The patient agreed with the plan and demonstrated an understanding of the instructions.   The patient was advised to call back or seek an in-person evaluation if the symptoms worsen or if the condition fails to improve as anticipated.  I provided 66 minutes of non-face-to-face time during this encounter. Barbara Pennington, [facility]  Session Time: 12:00 PM - 1:06 PM  Participation Level: Active  Behavioral Response: Well Groomed, Alert, Dysphoric  Type of Therapy: Group Therapy  Interventions: CBT and Supportive  Summary: Barbara Pennington is a 43 y.o. female who presents with a history of anxiety, depression, skin picking disorder, and BPD. She appeared somber but oriented x5. She reported this week was pure hell. She reported she worked on her goal approximately 40% of the time. Kaelani wants to continue this goal this week and also increase productivity and reduce why questions.  Therapist Response: Conducted telehealth group session. Began session with introductions and review of group rules and expectations. Engaged group members in mindfulness exercise - guided meditation for receiving love in relationships. Conducted brief check-in with each group member. Reviewed handouts and worksheets - Interpersonal effectiveness module - Mindfulness of others and ending destructive  relationships. Passenger transport manager with each group member and goal setting between now and next session.   Suicidal/Homicidal: No  Diagnosis: Borderline personality disorder (HCC)  Skin-picking disorder  Patient/Guardian was advised Release of Information must be obtained prior to any record release in order to collaborate their care with an outside provider. Patient/Guardian was advised if they have not already done so to contact the registration department to sign all necessary forms in order for us  to release information regarding their care.   Consent: Patient/Guardian gives verbal consent for treatment and assignment of benefits for services provided during this visit. Patient/Guardian expressed understanding and agreed to proceed.   Barbara Pennington, [facility] 07/29/2023

## 2023-07-30 ENCOUNTER — Other Ambulatory Visit: Payer: Self-pay | Admitting: Family Medicine

## 2023-08-01 ENCOUNTER — Ambulatory Visit (INDEPENDENT_AMBULATORY_CARE_PROVIDER_SITE_OTHER): Admitting: Professional Counselor

## 2023-08-01 DIAGNOSIS — F4310 Post-traumatic stress disorder, unspecified: Secondary | ICD-10-CM

## 2023-08-01 DIAGNOSIS — F411 Generalized anxiety disorder: Secondary | ICD-10-CM

## 2023-08-01 DIAGNOSIS — F424 Excoriation (skin-picking) disorder: Secondary | ICD-10-CM | POA: Diagnosis not present

## 2023-08-01 DIAGNOSIS — F603 Borderline personality disorder: Secondary | ICD-10-CM

## 2023-08-01 NOTE — Progress Notes (Unsigned)
 THERAPIST PROGRESS NOTE  Virtual Visit via Video Note  I connected with Barbara Pennington on 08/02/23 at  4:00 PM EDT by a video enabled telemedicine application and verified that I am speaking with the correct person using two identifiers.  Location: Patient: Home Provider: Office   I discussed the limitations of evaluation and management by telemedicine and the availability of in person appointments. The patient expressed understanding and agreed to proceed.   I discussed the assessment and treatment plan with the patient. The patient was provided an opportunity to ask questions and all were answered. The patient agreed with the plan and demonstrated an understanding of the instructions.   The patient was advised to call back or seek an in-person evaluation if the symptoms worsen or if the condition fails to improve as anticipated.  I provided 53 minutes of non-face-to-face time during this encounter. [name], [facility]  Session Time: 4:01 PM - 4:54 PM   Participation Level: Active  Behavioral Response: Casual, Alert, Dysphoric  Type of Therapy: Individual Therapy  Treatment Goals addressed: Active Anxiety  LTG: I guess working through all the issues from the past. Learning how to better cope with stress.                Start:  02/05/23   End:  02/04/24   STG: I don't deal with stress well. I get overwhelmed. I shut down. I get angry. I can't communicate my thoughts. To improve knowledge of DBT skills to reduce sxs per self report over the next 12 weeks.   ProgressTowards Goals: Progressing  Interventions: CBT, Motivational Interviewing, Solution Focused, and Supportive  Summary: Barbara Pennington is a 43 y.o. female who presents with a history of anxiety, depression, skin picking, BPD, and PTSD. She appeared somber but oriented x5. She reported it has been another tough week. Britne was tearful as she discussed her ongoing grief around her children and struggle with  accepting how things are. She reported she had one day where everything went wrong and she wasn't able to engage in any skills usage. She was receptive to distress tolerance skill - TIP - to help. Sulamita inquired about ways to build motivation and task completion. She took notes on tips and will try to implement them. She reported it will be hard to do some things differently with her partner around though. She is willing to try and use interpersonal effectiveness skills to discuss it with him.    Therapist Response: Conducted session with [name]. Began session with check-in/update since previous session. Utilized empathetic and reflective listening. Normalized grief around estrangement from her children. Explored ways to work towards acceptance. Used open-ended questions to facilitate discussion and summarized thoughts/feelings. Offered distress tolerance skills for when Barbara Pennington is at a skills breakdown. Shared tips on motivation/productivity such as countdown to blast-off, timers, habit stacking, rewards, and chunking. Encouraged Jenica to use skills from interpersonal effectiveness to discuss changes with her partner. Scheduled additional appointment and concluded session.   Suicidal/Homicidal: No  Plan: Return again in 3 weeks.  Diagnosis: PTSD (post-traumatic stress disorder)  Borderline personality disorder (HCC)  GAD (generalized anxiety disorder)  Skin-picking disorder  Collaboration of Care: Medication Management AEB chart review  Patient/Guardian was advised Release of Information must be obtained prior to any record release in order to collaborate their care with an outside provider. Patient/Guardian was advised if they have not already done so to contact the registration department to sign all necessary forms in order for  us  to release information regarding their care.   Consent: Patient/Guardian gives verbal consent for treatment and assignment of benefits for services provided during this  visit. Patient/Guardian expressed understanding and agreed to proceed.   [name], [facility] 08/02/2023

## 2023-08-02 ENCOUNTER — Ambulatory Visit (INDEPENDENT_AMBULATORY_CARE_PROVIDER_SITE_OTHER): Admitting: Professional Counselor

## 2023-08-02 DIAGNOSIS — F603 Borderline personality disorder: Secondary | ICD-10-CM | POA: Diagnosis not present

## 2023-08-02 NOTE — Progress Notes (Signed)
  THERAPIST PROGRESS NOTE  Virtual Visit via Video Note  I connected with Barbara Pennington on 08/02/23 at 12:00 PM EDT by a video enabled telemedicine application and verified that I am speaking with the correct person using two identifiers.  Location: Patient: Home Provider: Remote office   I discussed the limitations of evaluation and management by telemedicine and the availability of in person appointments. The patient expressed understanding and agreed to proceed.   I discussed the assessment and treatment plan with the patient. The patient was provided an opportunity to ask questions and all were answered. The patient agreed with the plan and demonstrated an understanding of the instructions.   The patient was advised to call back or seek an in-person evaluation if the symptoms worsen or if the condition fails to improve as anticipated.  I provided 50 minutes of non-face-to-face time during this encounter. Barbara Pennington, [facility]  Session Time: 12:00 PM - 12:50 PM   Participation Level: Active  Behavioral Response: Well Groomed, Alert, Dysphoric  Type of Therapy: Group Therapy  Interventions: CBT and Supportive  Summary: Barbara Pennington is a 43 y.o. female who presents with a history of anxiety, depression, skin-picking disorder, PTSD, and BPD. She appeared alert and oriented x5. She reported she had a rocky weekend and rough week. She was able to practice some communication skills with her partner, but wasn't able to practice communicating with strangers. She identified her goal for this week as being more productive and using skills identified in her individual session.   Therapist Response: Conducted telehealth group session. Began session with introductions and review of group rules and expectations. Engaged group members in mindfulness exercise - guided meditation for letting go. Conducted brief check-in with each group member. Reviewed handouts and worksheets - Interpersonal  effectiveness module - Dialectics. Passenger transport manager with each group member and goal setting between now and next session.   Suicidal/Homicidal: No  Diagnosis: Borderline personality disorder (HCC)  Patient/Guardian was advised Release of Information must be obtained prior to any record release in order to collaborate their care with an outside provider. Patient/Guardian was advised if they have not already done so to contact the registration department to sign all necessary forms in order for us  to release information regarding their care.   Consent: Patient/Guardian gives verbal consent for treatment and assignment of benefits for services provided during this visit. Patient/Guardian expressed understanding and agreed to proceed.   Barbara Pennington, [facility] 08/02/2023

## 2023-08-07 ENCOUNTER — Telehealth: Payer: Self-pay

## 2023-08-07 NOTE — Telephone Encounter (Signed)
 Attempted to call patient. Message left.

## 2023-08-07 NOTE — Telephone Encounter (Signed)
Attempted to call patient again, message left.

## 2023-08-07 NOTE — Telephone Encounter (Signed)
 She has an RFA on knee next week and she has questions. She would like to be called back.

## 2023-08-08 ENCOUNTER — Telehealth: Payer: Self-pay

## 2023-08-08 NOTE — Telephone Encounter (Signed)
 Attempted to call patient again about her questions regarding RFA.  Left message to call us  back.

## 2023-08-14 ENCOUNTER — Encounter: Payer: Self-pay | Admitting: Student in an Organized Health Care Education/Training Program

## 2023-08-14 ENCOUNTER — Ambulatory Visit
Admission: RE | Admit: 2023-08-14 | Discharge: 2023-08-14 | Disposition: A | Discharge status: Home / Self Care | Source: Ambulatory Visit | Attending: Student in an Organized Health Care Education/Training Program | Admitting: Student in an Organized Health Care Education/Training Program

## 2023-08-14 ENCOUNTER — Other Ambulatory Visit: Payer: Self-pay | Admitting: Student in an Organized Health Care Education/Training Program

## 2023-08-14 ENCOUNTER — Ambulatory Visit: Admitting: Student in an Organized Health Care Education/Training Program

## 2023-08-14 VITALS — BP 117/70 | HR 63 | Temp 97.4°F | Resp 17 | Ht 65.0 in | Wt 340.0 lb

## 2023-08-14 DIAGNOSIS — G8929 Other chronic pain: Secondary | ICD-10-CM

## 2023-08-14 DIAGNOSIS — M1712 Unilateral primary osteoarthritis, left knee: Secondary | ICD-10-CM | POA: Insufficient documentation

## 2023-08-14 DIAGNOSIS — G894 Chronic pain syndrome: Secondary | ICD-10-CM

## 2023-08-14 DIAGNOSIS — M25562 Pain in left knee: Secondary | ICD-10-CM | POA: Insufficient documentation

## 2023-08-14 MED ORDER — DEXAMETHASONE SODIUM PHOSPHATE 10 MG/ML IJ SOLN
INTRAMUSCULAR | Status: AC
  Filled 2023-08-14: qty 1

## 2023-08-14 MED ORDER — METHYLPREDNISOLONE ACETATE 40 MG/ML IJ SUSP
INTRAMUSCULAR | Status: AC
  Filled 2023-08-14: qty 1

## 2023-08-14 MED ORDER — LACTATED RINGERS IV SOLN: Freq: Once | INTRAVENOUS | Status: AC

## 2023-08-14 MED ORDER — LIDOCAINE HCL 2 % IJ SOLN
20.0000 mL | Freq: Once | INTRAMUSCULAR | Status: AC
  Administered 2023-08-14: 400 mg

## 2023-08-14 MED ORDER — FENTANYL CITRATE (PF) 100 MCG/2ML IJ SOLN
INTRAMUSCULAR | Status: AC
  Filled 2023-08-14: qty 2

## 2023-08-14 MED ORDER — MIDAZOLAM HCL 5 MG/5ML IJ SOLN
0.5000 mg | Freq: Once | INTRAMUSCULAR | Status: AC
  Administered 2023-08-14: 2 mg via INTRAVENOUS

## 2023-08-14 MED ORDER — MIDAZOLAM HCL 5 MG/5ML IJ SOLN
INTRAMUSCULAR | Status: AC
  Filled 2023-08-14: qty 5

## 2023-08-14 MED ORDER — ROPIVACAINE HCL 2 MG/ML IJ SOLN
INTRAMUSCULAR | Status: AC
  Filled 2023-08-14: qty 20

## 2023-08-14 MED ORDER — FENTANYL CITRATE (PF) 100 MCG/2ML IJ SOLN
25.0000 ug | INTRAMUSCULAR | Status: AC | PRN
  Administered 2023-08-14: 25 ug via INTRAVENOUS
  Administered 2023-08-14: 50 ug via INTRAVENOUS

## 2023-08-14 MED ORDER — ROPIVACAINE HCL 2 MG/ML IJ SOLN
9.0000 mL | Freq: Once | INTRAMUSCULAR | Status: AC
  Administered 2023-08-14: 9 mL via PERINEURAL

## 2023-08-14 MED ORDER — LIDOCAINE HCL 2 % IJ SOLN
INTRAMUSCULAR | Status: AC
  Filled 2023-08-14: qty 20

## 2023-08-14 MED ORDER — DEXAMETHASONE SODIUM PHOSPHATE 10 MG/ML IJ SOLN
10.0000 mg | Freq: Once | INTRAMUSCULAR | Status: AC
  Administered 2023-08-14: 10 mg

## 2023-08-14 NOTE — Progress Notes (Signed)
 PROVIDER NOTE: Interpretation of information contained herein should be left to medically-trained personnel. Specific patient instructions are provided elsewhere under Patient Instructions section of medical record. This document was created in part using STT-dictation technology, any transcriptional errors that may result from this process are unintentional.  Patient: Barbara Pennington Type: Established DOB: Jun 29, 1980 MRN: [id number] PCP: Sowles, Krichna, MD  Service: Procedure DOS: 08/14/2023 Setting: Ambulatory Location: Ambulatory outpatient facility Delivery: Face-to-face Provider: Wallie Sherry, MD Specialty: Interventional Pain Management Specialty designation: 09 Location: Outpatient facility Ref. Prov.: Sowles, Krichna, MD       Interventional Therapy     Procedure:          Anesthesia, Analgesia, Anxiolysis:  Type: Therapeutic Superolateral, Superomedial, and Inferomedial, Genicular Nerve Radiofrequency Ablation (destruction).            Region: Lateral, Anterior, and Medial aspects of the knee joint, above and below the knee joint proper. Level: Superior and inferior to the knee joint. Laterality: Left  Anesthesia: Local (1-2% Lidocaine )  Anxiolysis: None  Sedation: Moderate  Guidance: Fluoroscopy           Position: Supine   Indications: 1. Arthritis of left knee   2. Chronic pain of left knee   3. Chronic pain syndrome    Ms. Jerrett has been dealing with the above chronic pain for longer than three months and has either failed to respond, was unable to tolerate, or simply did not get enough benefit from other more conservative therapies including, but not limited to: 1. Over-the-counter medications 2. Anti-inflammatory medications 3. Muscle relaxants 4. Membrane stabilizers 5. Opioids 6. Physical therapy and/or chiropractic manipulation 7. Modalities (Heat, ice, etc.) 8. Invasive techniques such as nerve blocks. [name] has attained more than 50% relief of the  pain from a series of diagnostic injections conducted in separate occasions.  Pain Score: Pre-procedure: 4 /10 Post-procedure: 0-No pain/10    H&P (Pre-op Assessment):  [name] is a 43 y.o. (year old), female patient, seen today for interventional treatment. She  has a past surgical history that includes Tonsillectomy (01/08/1982); Cesarean section (01/08/2005); Gum surgery (01/08/2002); Abdominal hysterectomy (01/08/2006); Plantar fascia release (Left, 08/19/2014); Breast biopsy (Left, 01/09/2011); Colonoscopy with propofol  (N/A, 10/09/2016); Colonoscopy with propofol  (N/A, 11/09/2019); Esophagogastroduodenoscopy; and Knee arthroscopy with medial menisectomy (Right, 07/24/2021). Ms. Start has a current medication list which includes the following prescription(s): acetaminophen , airsupra , amlodipine , atorvastatin , budesonide -formoterol , bupropion , buspirone , celecoxib , desloratadine , escitalopram , fluticasone , gabapentin , melatonin, modafinil , montelukast , wegovy , wegovy , valsartan -hydrochlorothiazide , and aerochamber mv. Her primarily concern today is the Knee Pain (left)  Initial Vital Signs:  Pulse/HCG Rate: 74ECG Heart Rate: 62 Temp: (!) 97.4 F (36.3 C) Resp: 16 BP: 121/73 SpO2: 99 %  BMI: Estimated body mass index is 56.58 kg/m as calculated from the following:   Height as of this encounter: 5' 5 (1.651 m).   Weight as of this encounter: 340 lb (154.2 kg).  Risk Assessment: Allergies: Reviewed. She is allergic to wound dressing adhesive, latex, and metformin and related.  Allergy Precautions: None required Coagulopathies: Reviewed. None identified.  Blood-thinner therapy: None at this time Active Infection(s): Reviewed. None identified. Ms. Naab is afebrile  Site Confirmation: [name] was asked to confirm the procedure and laterality before marking the site Procedure checklist: Completed Consent: Before the procedure and under the influence of no sedative(s),  amnesic(s), or anxiolytics, the patient was informed of the treatment options, risks and possible complications. To fulfill our ethical and legal obligations, as recommended by the American Medical Association's Code  of Ethics, I have informed the patient of my clinical impression; the nature and purpose of the treatment or procedure; the risks, benefits, and possible complications of the intervention; the alternatives, including doing nothing; the risk(s) and benefit(s) of the alternative treatment(s) or procedure(s); and the risk(s) and benefit(s) of doing nothing. The patient was provided information about the general risks and possible complications associated with the procedure. These may include, but are not limited to: failure to achieve desired goals, infection, bleeding, organ or nerve damage, allergic reactions, paralysis, and death. In addition, the patient was informed of those risks and complications associated to the procedure, such as failure to decrease pain; infection; bleeding; organ or nerve damage with subsequent damage to sensory, motor, and/or autonomic systems, resulting in permanent pain, numbness, and/or weakness of one or several areas of the body; allergic reactions; (i.e.: anaphylactic reaction); and/or death. Furthermore, the patient was informed of those risks and complications associated with the medications. These include, but are not limited to: allergic reactions (i.e.: anaphylactic or anaphylactoid reaction(s)); adrenal axis suppression; blood sugar elevation that in diabetics may result in ketoacidosis or comma; water retention that in patients with history of congestive heart failure may result in shortness of breath, pulmonary edema, and decompensation with resultant heart failure; weight gain; swelling or edema; medication-induced neural toxicity; particulate matter embolism and blood vessel occlusion with resultant organ, and/or nervous system infarction; and/or aseptic  necrosis of one or more joints. Finally, the patient was informed that Medicine is not an exact science; therefore, there is also the possibility of unforeseen or unpredictable risks and/or possible complications that may result in a catastrophic outcome. The patient indicated having understood very clearly. We have given the patient no guarantees and we have made no promises. Enough time was given to the patient to ask questions, all of which were answered to the patient's satisfaction. Ms. Blasdell has indicated that she wanted to continue with the procedure. Attestation: I, the ordering provider, attest that I have discussed with the patient the benefits, risks, side-effects, alternatives, likelihood of achieving goals, and potential problems during recovery for the procedure that I have provided informed consent. Date  Time: 08/14/2023  7:54 AM  Pre-Procedure Preparation:  Monitoring: As per clinic protocol. Respiration, ETCO2, SpO2, BP, heart rate and rhythm monitor placed and checked for adequate function Safety Precautions: Patient was assessed for positional comfort and pressure points before starting the procedure. Time-out: I initiated and conducted the Time-out before starting the procedure, as per protocol. The patient was asked to participate by confirming the accuracy of the Time Out information. Verification of the correct person, site, and procedure were performed and confirmed by me, the nursing staff, and the patient. Time-out conducted as per Joint Commission's Universal Protocol (UP.01.01.01). Time: 0829 Start Time: 0829 hrs.  Description of Procedure:          Target Area: For Genicular Nerve radiofrequency ablation (destruction), the targets are: the superolateral genicular nerve, located in the lateral distal portion of the femoral shaft as it curves to form the lateral epicondyle, in the region of the distal femoral metaphysis; the superomedial genicular nerve, located in the  medial distal portion of the femoral shaft as it curves to form the medial epicondyle; and the inferomedial genicular nerve, located in the medial, proximal portion of the tibial shaft, as it curves to form the medial epicondyle, in the region of the proximal tibial metaphysis. Approach: Anterior, ipsilateral approach. Area Prepped: Entire knee area, from mid-thigh to mid-shin,  lateral, anterior, and medial aspects. ChloraPrep (2% chlorhexidine  gluconate and 70% isopropyl alcohol) Safety Precautions: Aspiration looking for blood return was conducted prior to all injections. At no point did we inject any substances, as a needle was being advanced. No attempts were made at seeking any paresthesias. Safe injection practices and needle disposal techniques used. Medications properly checked for expiration dates. SDV (single dose vial) medications used. Description of the Procedure: Protocol guidelines were followed. The patient was placed in position over the procedure table. The target area was identified and the area prepped in the usual manner. The skin and muscle were infiltrated with local anesthetic. Appropriate amount of time allowed to pass for local anesthetics to take effect. Radiofrequency needles were introduced to the target area using fluoroscopic guidance. Using the [employer] Radiofrequency Generator, sensory stimulation using 50 Hz was used to locate & identify the nerve, making sure that the needle was positioned such that there was no sensory stimulation below 0.3 V or above 0.7 V. Stimulation using 2 Hz was used to evaluate the motor component. Care was taken not to lesion any nerves that demonstrated motor stimulation of the lower extremities at an output of less than 2.5 times that of the sensory threshold, or a maximum of 2.0 V. Once satisfactory placement of the needles was achieved, the numbing solution was slowly injected after negative aspiration. After waiting for at least 2 minutes, the  ablation was performed at 80 degrees C for 60 seconds, using regular Radiofrequency settings. Once the procedure was completed, the needles were then removed and the area cleansed, making sure to leave some of the prepping solution back to take advantage of its long term bactericidal properties. Intra-operative Compliance: Compliant           Vitals:   08/14/23 0843 08/14/23 0853 08/14/23 0903 08/14/23 0913  BP: 106/65 121/77 116/65 117/70  Pulse:      Resp: 17 16 18 17   Temp:      SpO2: 100% 100% 99% 99%  Weight:      Height:        Start Time: 0829 hrs. End Time: 0840 hrs. Materials & Medications:  Needle(s) Type: Teflon-coated, curved tip, Radiofrequency needle(s) Gauge: 22G Length: 10cm Medication(s): Please see orders for medications and dosing details.  3 cc of nerve block solution injected at each of the above levels after testing, prior to lesioning  Imaging Guidance (Non-Spinal):          Type of Imaging Technique: Fluoroscopy Guidance (Non-Spinal) Indication(s): Fluoroscopy guidance for needle placement to enhance accuracy in procedures requiring precise needle localization for targeted delivery of medication in or near specific anatomical locations not easily accessible without such real-time imaging assistance. Exposure Time: Please see nurses notes. Contrast: Before injecting any contrast, we confirmed that the patient did not have an allergy to iodine, shellfish, or radiological contrast. Once satisfactory needle placement was completed at the desired level, radiological contrast was injected. Contrast injected under live fluoroscopy. No contrast complications. See chart for type and volume of contrast used. Fluoroscopic Guidance: I was personally present during the use of fluoroscopy. Tunnel Vision Technique used to obtain the best possible view of the target area. Parallax error corrected before commencing the procedure. Direction-depth-direction technique used to  introduce the needle under continuous pulsed fluoroscopy. Once target was reached, antero-posterior, oblique, and lateral fluoroscopic projection used confirm needle placement in all planes. Images permanently stored in EMR. Interpretation: I personally interpreted the imaging intraoperatively. Adequate needle placement confirmed in  multiple planes. Appropriate spread of contrast into desired area was observed. No evidence of afferent or efferent intravascular uptake. Permanent images saved into the patient's record.  Antibiotic Prophylaxis:   Anti-infectives (From admission, onward)    None      Indication(s): None identified  Post-operative Assessment:  Post-procedure Vital Signs:  Pulse/HCG Rate: 6378 Temp: (!) 97.4 F (36.3 C) Resp: 17 BP: 117/70 SpO2: 99 %  EBL: None  Complications: No immediate post-treatment complications observed by team, or reported by patient.  Note: The patient tolerated the entire procedure well. A repeat set of vitals were taken after the procedure and the patient was kept under observation following institutional policy, for this type of procedure. Post-procedural neurological assessment was performed, showing return to baseline, prior to discharge. The patient was provided with post-procedure discharge instructions, including a section on how to identify potential problems. Should any problems arise concerning this procedure, the patient was given instructions to immediately contact us , at any time, without hesitation. In any case, we plan to contact the patient by telephone for a follow-up status report regarding this interventional procedure.  Comments:  No additional relevant information.  Plan of Care (POC)   Medications ordered for procedure: Meds ordered this encounter  Medications   lidocaine  (XYLOCAINE ) 2 % (with pres) injection 400 mg   lactated ringers  infusion   midazolam  (VERSED ) 5 MG/5ML injection 0.5-2 mg    Make sure Flumazenil is  available in the pyxis when using this medication. If oversedation occurs, administer 0.2 mg IV over 15 sec. If after 45 sec no response, administer 0.2 mg again over 1 min; may repeat at 1 min intervals; not to exceed 4 doses (1 mg)   fentaNYL  (SUBLIMAZE ) injection 25-50 mcg    Make sure Narcan is available in the pyxis when using this medication. In the event of respiratory depression (RR< 8/min): Titrate NARCAN (naloxone) in increments of 0.1 to 0.2 mg IV at 2-3 minute intervals, until desired degree of reversal.   ropivacaine  (PF) 2 mg/mL (0.2%) (NAROPIN ) injection 9 mL   dexamethasone  (DECADRON ) injection 10 mg   Medications administered: We administered lidocaine , lactated ringers , midazolam , fentaNYL , ropivacaine  (PF) 2 mg/mL (0.2%), and dexamethasone .  See the medical record for exact dosing, route, and time of administration.    Left GNB 05/27/23, 07/10/23; RFA 08/14/23     Follow-up plan:   Return in about 8 weeks (around 10/09/2023) for Emmy Blanch, PPE.     Recent Visits Date Type Provider Dept  07/10/23 Procedure visit Marcelino Nurse, MD [facility]  06/27/23 Office Visit Patel, Seema K, NP [facility]  05/27/23 Procedure visit Marcelino Nurse, MD [facility]  Showing recent visits within past 90 days and meeting all other requirements Today's Visits Date Type Provider Dept  08/14/23 Procedure visit Marcelino Nurse, MD [facility]  Showing today's visits and meeting all other requirements Future Appointments Date Type Provider Dept  10/09/23 Appointment Patel, Seema K, NP [facility]  Showing future appointments within next 90 days and meeting all other requirements   Disposition: Discharge home  Discharge (Date  Time): 08/14/2023;   hrs.   Primary Care Physician: Sowles, Krichna, MD Location: [facility] Note by: Nurse Marcelino, MD (TTS technology used. I apologize for any typographical errors that  were not detected and corrected.) Date: 08/14/2023; Time: 9:23 AM  Disclaimer:  Medicine is not an Visual merchandiser. The only guarantee in medicine is that nothing is guaranteed.  It is important to note that the decision to proceed with this intervention was based on the information collected from the patient. The Data and conclusions were drawn from the patient's questionnaire, the interview, and the physical examination. Because the information was provided in large part by the patient, it cannot be guaranteed that it has not been purposely or unconsciously manipulated. Every effort has been made to obtain as much relevant data as possible for this evaluation. It is important to note that the conclusions that lead to this procedure are derived in large part from the available data. Always take into account that the treatment will also be dependent on availability of resources and existing treatment guidelines, considered by other Pain Management Practitioners as being common knowledge and practice, at the time of the intervention. For Medico-Legal purposes, it is also important to point out that variation in procedural techniques and pharmacological choices are the acceptable norm. The indications, contraindications, technique, and results of the above procedure should only be interpreted and judged by a Board-Certified Interventional Pain Specialist with extensive familiarity and expertise in the same exact procedure and technique.

## 2023-08-14 NOTE — Progress Notes (Signed)
 Safety precautions to be maintained throughout the outpatient stay will include: orient to surroundings, keep bed in low position, maintain call bell within reach at all times, provide assistance with transfer out of bed and ambulation.

## 2023-08-14 NOTE — Patient Instructions (Addendum)
 Moderate Conscious Sedation, Adult, Care After After the procedure, it is common to have: Sleepiness for a few hours. Impaired judgment for a few hours. Trouble with balance. Nausea or vomiting if you eat too soon. Follow these instructions at home: For the time period you were told by your health care provider:  Rest. Do not participate in activities where you could fall or become injured. Do not drive or use machinery. Do not drink alcohol. Do not take sleeping pills or medicines that cause drowsiness. Do not make important decisions or sign legal documents. Do not take care of children on your own. Eating and drinking Follow instructions from your health care provider about what you may eat and drink. Drink enough fluid to keep your urine pale yellow. If you vomit: Drink clear fluids slowly and in small amounts as you are able. Clear fluids include water, ice chips, low-calorie sports drinks, and fruit juice that has water added to it (diluted fruit juice). Eat light and bland foods in small amounts as you are able. These foods include bananas, applesauce, rice, lean meats, toast, and crackers. General instructions Take over-the-counter and prescription medicines only as told by your health care provider. Have a responsible adult stay with you for the time you are told. Do not use any products that contain nicotine or tobacco. These products include cigarettes, chewing tobacco, and vaping devices, such as e-cigarettes. If you need help quitting, ask your health care provider. Return to your normal activities as told by your health care provider. Ask your health care provider what activities are safe for you. Your health care provider may give you more instructions. Make sure you know what you can and cannot do. Contact a health care provider if: You are still sleepy or having trouble with balance after 24 hours. You feel light-headed. You vomit every time you eat or drink. You get  a rash. You have a fever. You have redness or swelling around the IV site. Get help right away if: You have trouble breathing. You start to feel confused at home. These symptoms may be an emergency. Get help right away. Call 911. Do not wait to see if the symptoms will go away. Do not drive yourself to the hospital. This information is not intended to replace advice given to you by your health care provider. Make sure you discuss any questions you have with your health care provider. Document Revised: 07/10/2021 Document Reviewed: 07/10/2021 Elsevier Patient Education  2024 [employer] ______________________________________________________________________    Post-Procedure Discharge Instructions  Instructions: Apply ice:  Purpose: This will minimize any swelling and discomfort after procedure.  When: Day of procedure, as soon as you get home. How: Fill a plastic sandwich bag with crushed ice. Cover it with a small towel and apply to injection site. How long: (15 min on, 15 min off) Apply for 15 minutes then remove x 15 minutes.  Repeat sequence on day of procedure, until you go to bed. Apply heat:  Purpose: To treat any soreness and discomfort from the procedure. When: Starting the next day after the procedure. How: Apply heat to procedure site starting the day following the procedure. How long: May continue to repeat daily, until discomfort goes away. Food intake: Start with clear liquids (like water) and advance to regular food, as tolerated.  Physical activities: Keep activities to a minimum for the first 8 hours after the procedure. After that, then as tolerated. Driving: If you have received any sedation, be responsible and do  not drive. You are not allowed to drive for 24 hours after having sedation. Blood thinner: (Applies only to those taking blood thinners) You may restart your blood thinner 6 hours after your procedure. Insulin: (Applies only to Diabetic patients taking  insulin) As soon as you can eat, you may resume your normal dosing schedule. Infection prevention: Keep procedure site clean and dry. Shower daily and clean area with soap and water. Post-procedure Pain Diary: Extremely important that this be done correctly and accurately. Recorded information will be used to determine the next step in treatment. For the purpose of accuracy, follow these rules: Evaluate only the area treated. Do not report or include pain from an untreated area. For the purpose of this evaluation, ignore all other areas of pain, except for the treated area. After your procedure, avoid taking a long nap and attempting to complete the pain diary after you wake up. Instead, set your alarm clock to go off every hour, on the hour, for the initial 8 hours after the procedure. Document the duration of the numbing medicine, and the relief you are getting from it. Do not go to sleep and attempt to complete it later. It will not be accurate. If you received sedation, it is likely that you were given a medication that may cause amnesia. Because of this, completing the diary at a later time may cause the information to be inaccurate. This information is needed to plan your care. Follow-up appointment: Keep your post-procedure follow-up evaluation appointment after the procedure (usually 2 weeks for most procedures, 6 weeks for radiofrequencies). DO NOT FORGET to bring you pain diary with you.   Expect: (What should I expect to see with my procedure?) From numbing medicine (AKA: Local Anesthetics): Numbness or decrease in pain. You may also experience some weakness, which if present, could last for the duration of the local anesthetic. Onset: Full effect within 15 minutes of injected. Duration: It will depend on the type of local anesthetic used. On the average, 1 to 8 hours.  From steroids (Applies only if steroids were used): Decrease in swelling or inflammation. Once inflammation is improved,  relief of the pain will follow. Onset of benefits: Depends on the amount of swelling present. The more swelling, the longer it will take for the benefits to be seen. In some cases, up to 10 days. Duration: Steroids will stay in the system x 2 weeks. Duration of benefits will depend on multiple posibilities including persistent irritating factors. Side-effects: If present, they may typically last 2 weeks (the duration of the steroids). Frequent: Cramps (if they occur, drink Gatorade and take over-the-counter Magnesium 450-500 mg once to twice a day); water retention with temporary weight gain; increases in blood sugar; decreased immune system response; increased appetite. Occasional: Facial flushing (red, warm cheeks); mood swings; menstrual changes. Uncommon: Long-term decrease or suppression of natural hormones; bone thinning. (These are more common with higher doses or more frequent use. This is why we prefer that our patients avoid having any injection therapies in other practices.)  Very Rare: Severe mood changes; psychosis; aseptic necrosis. From procedure: Some discomfort is to be expected once the numbing medicine wears off. This should be minimal if ice and heat are applied as instructed.  Call if: (When should I call?) You experience numbness and weakness that gets worse with time, as opposed to wearing off. New onset bowel or bladder incontinence. (Applies only to procedures done in the spine)  Emergency Numbers: Durning business hours (Monday -  Thursday, 8:00 AM - 4:00 PM) (Friday, 9:00 AM - 12:00 Noon): [phone] After hours: [phone] NOTE: If you are having a problem and are unable connect with, or to talk to a provider, then go to your nearest urgent care or emergency department. If the problem is serious and urgent, please call 911. ______________________________________________________________________

## 2023-08-15 ENCOUNTER — Telehealth: Payer: Self-pay

## 2023-08-15 NOTE — Telephone Encounter (Signed)
 Post procedure follow up.  Patient states she is doing good.

## 2023-08-16 ENCOUNTER — Ambulatory Visit: Admitting: Professional Counselor

## 2023-08-16 DIAGNOSIS — F4310 Post-traumatic stress disorder, unspecified: Secondary | ICD-10-CM | POA: Diagnosis not present

## 2023-08-16 DIAGNOSIS — F424 Excoriation (skin-picking) disorder: Secondary | ICD-10-CM | POA: Diagnosis not present

## 2023-08-16 DIAGNOSIS — F603 Borderline personality disorder: Secondary | ICD-10-CM | POA: Diagnosis not present

## 2023-08-16 NOTE — Progress Notes (Signed)
  THERAPIST PROGRESS NOTE  Virtual Visit via Video Note  I connected with Barbara Pennington on 08/19/23 at 12:00 PM EDT by a video enabled telemedicine application and verified that I am speaking with the correct person using two identifiers.  Location: Patient: Home Provider: Remote office   I discussed the limitations of evaluation and management by telemedicine and the availability of in person appointments. The patient expressed understanding and agreed to proceed.   I discussed the assessment and treatment plan with the patient. The patient was provided an opportunity to ask questions and all were answered. The patient agreed with the plan and demonstrated an understanding of the instructions.   The patient was advised to call back or seek an in-person evaluation if the symptoms worsen or if the condition fails to improve as anticipated.  I provided 60 minutes of non-face-to-face time during this encounter. [name], [facility]  Session Time: 12:00 PM - 1:00 PM   Participation Level: Active  Behavioral Response: Well Groomed, Alert, Dysphoric  Type of Therapy: Group Therapy  Interventions: CBT and Supportive  Summary: Barbara Pennington is a 43 y.o. female who presents with a history of anxiety, depression, trauma, skin-picking disorder, and BPD. She appeared alert and oriented x5. She stated she had a rough weekend and her boyfriend has been sick. She had a procedure on her knee and is already seeing improvements so she is hopeful about that. Barbara Pennington was engaged during group and receptive to homework given.   Therapist Response: Conducted telehealth group session. Began session with introductions and review of group rules and expectations. Engaged group members in mindfulness exercise - guided meditation for finding balance. Conducted brief check-in with each group member. Reviewed handouts and worksheets - Interpersonal effectiveness module - Validation. Passenger transport manager with each  group member and assigned homework to practice validation with self and others between now and next session.   Suicidal/Homicidal: No  Diagnosis: Borderline personality disorder (HCC)  PTSD (post-traumatic stress disorder)  Skin-picking disorder  Patient/Guardian was advised Release of Information must be obtained prior to any record release in order to collaborate their care with an outside provider. Patient/Guardian was advised if they have not already done so to contact the registration department to sign all necessary forms in order for us  to release information regarding their care.   Consent: Patient/Guardian gives verbal consent for treatment and assignment of benefits for services provided during this visit. Patient/Guardian expressed understanding and agreed to proceed.   [name], [facility] 08/16/2023

## 2023-08-21 ENCOUNTER — Ambulatory Visit (INDEPENDENT_AMBULATORY_CARE_PROVIDER_SITE_OTHER): Admitting: Professional Counselor

## 2023-08-21 DIAGNOSIS — F424 Excoriation (skin-picking) disorder: Secondary | ICD-10-CM

## 2023-08-21 DIAGNOSIS — F603 Borderline personality disorder: Secondary | ICD-10-CM | POA: Diagnosis not present

## 2023-08-21 DIAGNOSIS — F4310 Post-traumatic stress disorder, unspecified: Secondary | ICD-10-CM

## 2023-08-21 DIAGNOSIS — F411 Generalized anxiety disorder: Secondary | ICD-10-CM | POA: Diagnosis not present

## 2023-08-21 NOTE — Progress Notes (Signed)
  THERAPIST PROGRESS NOTE  Virtual Visit via Video Note  I connected with Wiletta L Lankford on 08/22/23 at  1:00 PM EDT by a video enabled telemedicine application and verified that I am speaking with the correct person using two identifiers.  Location: Patient: Home Provider: Remote office   I discussed the limitations of evaluation and management by telemedicine and the availability of in person appointments. The patient expressed understanding and agreed to proceed.   I discussed the assessment and treatment plan with the patient. The patient was provided an opportunity to ask questions and all were answered. The patient agreed with the plan and demonstrated an understanding of the instructions.   The patient was advised to call back or seek an in-person evaluation if the symptoms worsen or if the condition fails to improve as anticipated.  I provided 50 minutes of non-face-to-face time during this encounter. [name], [facility]  Session Time: 1:02 PM - 1:52 PM  Participation Level: Active  Behavioral Response: Well Groomed, Alert, Dysphoric  Type of Therapy: Individual Therapy  Treatment Goals addressed:  Active Anxiety  LTG: I guess working through all the issues from the past. Learning how to better cope with stress.                Start:  02/05/23   End:  02/04/24   STG: I don't deal with stress well. I get overwhelmed. I shut down. I get angry. I can't communicate my thoughts. To improve knowledge of DBT skills to reduce sxs per self report over the next 12 weeks.   ProgressTowards Goals: Progressing  Interventions: CBT, Motivational Interviewing, and Supportive  Summary: Nolie L Sauber is a 43 y.o. female who presents with a history of anxiety, depression, trauma, BPD, and skin-picking disorder. She appeared somber but oriented x5. She shared updates with work that are causing distress. Tenessa noted she can try to look for another job but shared her concerns with the  transition. She discussed a recent incident with her boyfriend and his family and the impact it had on her. Jerzi engaged in completing ABC worksheet and was receptive to Socratic questioning. She was tearful during session. She expressed frustration about her progress in therapy. She was receptive to challenging those thoughts as well.   Therapist Response: Conducted session with Taviana. Began session with check-in/update since previous session. Utilized empathetic and reflective listening. Explored options about employment. Used open-ended questions to facilitate discussion and summarized Kemaya's thoughts/feelings. Engaged Zamyra in [facility] worksheet and used Socratic questioning to help challenge negative thinking. Reminded Lilyannah of cognitive distortions to avoid. Emailed copies of worksheets so Cana can practice. Scheduled additional appointment and concluded session.   Suicidal/Homicidal: No  Plan: Return again in 4 weeks.  Diagnosis: Borderline personality disorder (HCC)  PTSD (post-traumatic stress disorder)  GAD (generalized anxiety disorder)  Skin-picking disorder  Collaboration of Care: Medication Management AEB chart review  Patient/Guardian was advised Release of Information must be obtained prior to any record release in order to collaborate their care with an outside provider. Patient/Guardian was advised if they have not already done so to contact the registration department to sign all necessary forms in order for us  to release information regarding their care.   Consent: Patient/Guardian gives verbal consent for treatment and assignment of benefits for services provided during this visit. Patient/Guardian expressed understanding and agreed to proceed.   [name], [facility] 08/22/2023

## 2023-08-23 ENCOUNTER — Ambulatory Visit: Admitting: Professional Counselor

## 2023-08-27 ENCOUNTER — Other Ambulatory Visit: Payer: Self-pay | Admitting: Family Medicine

## 2023-08-27 DIAGNOSIS — I10 Essential (primary) hypertension: Secondary | ICD-10-CM

## 2023-08-27 DIAGNOSIS — M1712 Unilateral primary osteoarthritis, left knee: Secondary | ICD-10-CM

## 2023-08-27 DIAGNOSIS — N1831 Chronic kidney disease, stage 3a: Secondary | ICD-10-CM

## 2023-09-04 ENCOUNTER — Encounter: Payer: Self-pay | Admitting: Psychiatry

## 2023-09-04 ENCOUNTER — Ambulatory Visit (INDEPENDENT_AMBULATORY_CARE_PROVIDER_SITE_OTHER): Admitting: Family Medicine

## 2023-09-04 ENCOUNTER — Ambulatory Visit (INDEPENDENT_AMBULATORY_CARE_PROVIDER_SITE_OTHER): Admitting: Psychiatry

## 2023-09-04 ENCOUNTER — Encounter: Payer: Self-pay | Admitting: Family Medicine

## 2023-09-04 VITALS — BP 128/76 | HR 100 | Resp 16 | Ht 65.0 in | Wt 335.3 lb

## 2023-09-04 VITALS — BP 112/78 | HR 102 | Temp 97.8°F | Ht 65.0 in | Wt 336.0 lb

## 2023-09-04 DIAGNOSIS — Z1159 Encounter for screening for other viral diseases: Secondary | ICD-10-CM

## 2023-09-04 DIAGNOSIS — N1831 Chronic kidney disease, stage 3a: Secondary | ICD-10-CM

## 2023-09-04 DIAGNOSIS — Z8659 Personal history of other mental and behavioral disorders: Secondary | ICD-10-CM | POA: Diagnosis not present

## 2023-09-04 DIAGNOSIS — F331 Major depressive disorder, recurrent, moderate: Secondary | ICD-10-CM

## 2023-09-04 DIAGNOSIS — G4733 Obstructive sleep apnea (adult) (pediatric): Secondary | ICD-10-CM | POA: Diagnosis not present

## 2023-09-04 DIAGNOSIS — F424 Excoriation (skin-picking) disorder: Secondary | ICD-10-CM

## 2023-09-04 DIAGNOSIS — Z131 Encounter for screening for diabetes mellitus: Secondary | ICD-10-CM

## 2023-09-04 DIAGNOSIS — F1011 Alcohol abuse, in remission: Secondary | ICD-10-CM

## 2023-09-04 DIAGNOSIS — N898 Other specified noninflammatory disorders of vagina: Secondary | ICD-10-CM

## 2023-09-04 DIAGNOSIS — E559 Vitamin D deficiency, unspecified: Secondary | ICD-10-CM

## 2023-09-04 DIAGNOSIS — E213 Hyperparathyroidism, unspecified: Secondary | ICD-10-CM

## 2023-09-04 DIAGNOSIS — F4310 Post-traumatic stress disorder, unspecified: Secondary | ICD-10-CM

## 2023-09-04 DIAGNOSIS — F411 Generalized anxiety disorder: Secondary | ICD-10-CM | POA: Diagnosis not present

## 2023-09-04 DIAGNOSIS — E782 Mixed hyperlipidemia: Secondary | ICD-10-CM

## 2023-09-04 DIAGNOSIS — L0100 Impetigo, unspecified: Secondary | ICD-10-CM

## 2023-09-04 DIAGNOSIS — R7982 Elevated C-reactive protein (CRP): Secondary | ICD-10-CM

## 2023-09-04 DIAGNOSIS — I10 Essential (primary) hypertension: Secondary | ICD-10-CM

## 2023-09-04 DIAGNOSIS — E538 Deficiency of other specified B group vitamins: Secondary | ICD-10-CM

## 2023-09-04 MED ORDER — MUPIROCIN 2 % EX OINT: 1.0000 | TOPICAL_OINTMENT | Freq: Two times a day (BID) | CUTANEOUS | 0 refills | Status: DC

## 2023-09-04 MED ORDER — WEGOVY 1 MG/0.5ML ~~LOC~~ SOAJ: 1.0000 mg | SUBCUTANEOUS | 0 refills | Status: DC

## 2023-09-04 MED ORDER — VALSARTAN-HYDROCHLOROTHIAZIDE 160-25 MG PO TABS: 1.0000 | ORAL_TABLET | Freq: Every day | ORAL | 0 refills | Status: DC

## 2023-09-04 MED ORDER — WEGOVY 1.7 MG/0.75ML ~~LOC~~ SOAJ: 1.7000 mg | SUBCUTANEOUS | 0 refills | Status: DC

## 2023-09-04 MED ORDER — FLUCONAZOLE 150 MG PO TABS: 150.0000 mg | ORAL_TABLET | ORAL | 0 refills | Status: DC

## 2023-09-04 MED ORDER — MODAFINIL 100 MG PO TABS: 100.0000 mg | ORAL_TABLET | Freq: Every day | ORAL | 0 refills | Status: DC

## 2023-09-04 NOTE — Progress Notes (Signed)
 [facility] MD OP Progress Note  09/04/2023 2:41 PM Barbara Pennington  MRN:  [id number]  Chief Complaint:  Chief Complaint  Patient presents with   Follow-up   Anxiety   Post-Traumatic Stress Disorder   Medication Refill   Discussed the use of AI scribe software for clinical note transcription with the patient, who gave verbal consent to proceed.  History of Present Illness Barbara Pennington is a 43 year old Caucasian female divorced, currently lives in [place], has a history of PTSD, anxiety, skin picking disorder, borderline personality disorder, morbid obesity, OSA on CPAP, hypertension/chronic kidney disease, asthma, chronic back pain, right sided knee injury was evaluated in office today for a follow-up appointment.  She reports ongoing sleep difficulties, noting that her sleep patterns have not been very good this week due to her mother's visit, which has led to staying up until 2 a.m. Overall, she notes improvement in her sleep since decreasing her coffee intake and continues to work toward achieving 8 hours of sleep per night. She continues to take melatonin at night, currently at a dose of 30 mg. She confirms ongoing use of Lexapro , bupropion  extended release 150 mg and buspirone  30 mg twice daily, and states she is still taking modafinil . She denies current use of clonazepam . She continues ongoing group therapy, which she describes as going well.  She continues to experience persistent skin picking behaviors, which she characterizes as bad at present. She identifies small injuries such as pimples, burns, or bug bites as common triggers for picking. She reports that using a moisturizing lotion her mother recommended has helped reduce irritation, though she notes difficulty maintaining a consistent routine with the lotion. She continues to work on finding alternative behaviors and coping strategies, such as distraction and using her hands for other activities.  She denies thoughts of hurting herself  or engaging in self-injurious behaviors.    Visit Diagnosis:    ICD-10-CM   1. PTSD (post-traumatic stress disorder)  F43.10     2. GAD (generalized anxiety disorder)  F41.1     3. 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 History of borderline personality disorder  Z86.59     5. History of alcohol abuse  F10.11       Past Psychiatric History: I have reviewed past psychiatric history from progress note on 05/05/2021.  Patient was previously under the care of [facility].  Past Medical History:  Past Medical History:  Diagnosis Date   Anxiety    Arthritis 2006   rhuematoid - mild - no current issues   Asthma 2003   Borderline personality disorder (HCC)    Chronic kidney disease    stage 3   Complication of anesthesia    Usually has low temp after surgery.After hysterectomy temp was 94   Depression    GERD (gastroesophageal reflux disease)    History of kidney stones    Hyperlipidemia    Hypertension 2013   Lump or mass in breast 2013   RIGHT BREAST   Motion sickness    repeated amusement park rides   PTSD (post-traumatic stress disorder)    Scoliosis    no current issues   Shortness of breath dyspnea    secondary to weight    Sleep apnea    uses cpap   Ulcer 2001    Past Surgical History:  Procedure Laterality Date   ABDOMINAL HYSTERECTOMY  01/08/2006   BREAST BIOPSY Left 01/09/2011   CORE - FIBROADENOMA VS PHYLLODES   CESAREAN  SECTION  01/08/2005   COLONOSCOPY WITH PROPOFOL  N/A 10/09/2016   Procedure: COLONOSCOPY WITH PROPOFOL ;  Surgeon: Therisa Bi, MD;  Location: [facility] ENDOSCOPY;  Service: Gastroenterology;  Laterality: N/A;   COLONOSCOPY WITH PROPOFOL  N/A 11/09/2019   Procedure: COLONOSCOPY WITH PROPOFOL ;  Surgeon: Unk Corinn Skiff, MD;  Location[facility] END[facility];  Service: Gastroenterology;  Laterality: N/A;   ESOPHAGOGASTRODUODENOSCOPY     GUM SURGERY  01/08/2002   KNEE ARTHROSCOPY WITH MEDIAL MENISECTOMY Right 07/24/2021   Procedure: Right medial meniscus root  repair and patella chondroplasty;  Surgeon: Tobie Priest, MD;  Location: [facility] ORS;  Service: Orthopedics;  Laterality: Right;   PLANTAR FASCIA RELEASE Left 08/19/2014   Procedure: ENDOSCOPIC PLANTAR FASCIOTOMY RELEASE L WITH TOPAZ;  Surgeon: Donnice Cory, DPM;  Location: [facility];  Service: Podiatry;  Laterality: Left;  LMA WITH POPLITEAL BLOCK   TONSILLECTOMY  01/08/1982    Family Psychiatric History: I have reviewed family psychiatric history from progress note on 05/05/2021.  Family History:  Family History  Problem Relation Age of Onset   Obesity Mother    Diabetes Mother    High Cholesterol Mother    Hypertension Mother    Neuropathy Mother    Depression Mother    Anxiety disorder Mother    Asthma Mother    Obesity Father    Hypertension Father    High Cholesterol Father    Depression Father    Anxiety disorder Father    Seizures Brother    Testicular cancer Brother    Breast cancer Maternal Grandmother    Hypertension Maternal Grandmother    Aneurysm Maternal Grandmother    Stroke Maternal Grandmother    Hypertension Maternal Grandfather    High Cholesterol Maternal Grandfather    Heart disease Maternal Grandfather        has a pacemaker   Stroke Maternal Grandfather    Alzheimer's disease Paternal Grandmother    Dementia Paternal Grandmother    Colon cancer Paternal Grandfather    Mental illness Maternal Aunt    Suicidality Cousin     Social History: I have reviewed social history from progress note on 05/05/2021. Social History   Socioeconomic History   Marital status: Divorced    Spouse name: Not on file   Number of children: 2   Years of education: 14   Highest education level: Bachelor's degree (e.g., BA, AB, BS)  Occupational History   Not on file  Tobacco Use   Smoking status: Former    Current packs/day: 0.00    Average packs/day: 0.3 packs/day for 10.0 years (2.5 ttl pk-yrs)    Types: Cigarettes    Start date: 07/04/2004    Quit  date: 07/05/2014    Years since quitting: 9.1   Smokeless tobacco: Never  Vaping Use   Vaping status: Never Used  Substance and Sexual Activity   Alcohol use: Yes    Comment: rare   Drug use: No   Sexual activity: Yes    Partners: Male    Birth control/protection: Surgical    Comment: 2007  Other Topics Concern   Not on file  Social History Narrative   Not on file   Social Drivers of Health   Financial Resource Strain: High Risk (03/04/2023)   Overall Financial Resource Strain (CARDIA)    Difficulty of Paying Living Expenses: Hard  Food Insecurity: Food Insecurity Present (03/04/2023)   Hunger Vital Sign    Worried About Running Out of Food in the Last Year: Often true  Ran Out of Food in the Last Year: Sometimes true  Transportation Needs: No Transportation Needs (03/04/2023)   PRAPARE - Administrator, Civil Service (Medical): No    Lack of Transportation (Non-Medical): No  Physical Activity: Inactive (03/04/2023)   Exercise Vital Sign    Days of Exercise per Week: 0 days    Minutes of Exercise per Session: 90 min  Stress: Stress Concern Present (03/04/2023)   [employer] of Occupational Health - Occupational Stress Questionnaire    Feeling of Stress : Very much  Social Connections: Moderately Isolated (03/04/2023)   Social Connection and Isolation Panel    Frequency of Communication with Friends and Family: Three times a week    Frequency of Social Gatherings with Friends and Family: Patient declined    Attends Religious Services: Never    Database administrator or Organizations: No    Attends Engineer, structural: Not on file    Marital Status: Living with partner    Allergies:  Allergies  Allergen Reactions   Wound Dressing Adhesive    Latex Rash   Metformin And Related     diarrhea    Metabolic Disorder Labs: Lab Results  Component Value Date   HGBA1C 5.2 11/08/2022   MPG 103 11/08/2022   MPG 111 12/15/2021   No results found  for: PROLACTIN Lab Results  Component Value Date   CHOL 147 11/08/2022   TRIG 100 11/08/2022   HDL 47 (L) 11/08/2022   CHOLHDL 3.1 11/08/2022   VLDL 27 08/06/2019   LDLCALC 80 11/08/2022   LDLCALC 110 (H) 12/15/2021   Lab Results  Component Value Date   TSH 1.83 12/15/2021   TSH 2.59 04/04/2018    Therapeutic Level Labs: No results found for: LITHIUM No results found for: VALPROATE No results found for: CBMZ  Current Medications: Current Outpatient Medications  Medication Sig Dispense Refill   acetaminophen  (TYLENOL ) 500 MG tablet Take 1,000 mg by mouth every 6 (six) hours as needed for moderate pain (pain score 4-6).     Albuterol -Budesonide  (AIRSUPRA ) 90-80 MCG/ACT AERO Inhale 2 puffs into the lungs 4 (four) times daily as needed. 10.7 g 1   amLODipine  (NORVASC ) 2.5 MG tablet Take 1 tablet (2.5 mg total) by mouth daily. 90 tablet 1   atorvastatin  (LIPITOR) 20 MG tablet Take 1 tablet (20 mg total) by mouth every morning. 90 tablet 1   budesonide -formoterol  (SYMBICORT ) 160-4.5 MCG/ACT inhaler Inhale 2 puffs into the lungs in the morning and at bedtime. 1 each 12   buPROPion  (WELLBUTRIN  XL) 150 MG 24 hr tablet TAKE 1 TABLET (150 MG TOTAL) BY MOUTH DAILY WITH BREAKFAST. STOP WELLBUTRIN  XL 300 MG 90 tablet 0   busPIRone  (BUSPAR ) 30 MG tablet TAKE 1 TABLET BY MOUTH 2 TIMES DAILY. 180 tablet 1   celecoxib  (CELEBREX ) 100 MG capsule TAKE 1 CAPSULE BY MOUTH TWICE A DAY 60 capsule 0   desloratadine  (CLARINEX ) 5 MG tablet Take 1 tablet (5 mg total) by mouth daily. 90 tablet 1   escitalopram  (LEXAPRO ) 20 MG tablet TAKE 1 TABLET BY MOUTH EVERY DAY 90 tablet 1   fluticasone  (FLONASE ) 50 MCG/ACT nasal spray Place 1 spray into both nostrils daily. 48 mL 1   Melatonin 10 MG CAPS Take 10-20 mg by mouth at bedtime.     meloxicam  (MOBIC ) 15 MG tablet Take 15 mg by mouth daily.     modafinil  (PROVIGIL ) 100 MG tablet Take 1 tablet (100 mg total) by mouth  daily. 90 tablet 0   montelukast   (SINGULAIR ) 10 MG tablet Take 1 tablet (10 mg total) by mouth at bedtime. 90 tablet 1   Semaglutide -Weight Management (WEGOVY ) 0.25 MG/0.5ML SOAJ Inject 0.25 mg into the skin once a week. 2 mL 0   Semaglutide -Weight Management (WEGOVY ) 0.5 MG/0.5ML SOAJ Inject 0.5 mg into the skin once a week. 2 mL 0   valsartan -hydrochlorothiazide  (DIOVAN -HCT) 160-25 MG tablet TAKE 1 TABLET BY MOUTH EVERY DAY 30 tablet 0   gabapentin  (NEURONTIN ) 300 MG capsule Take 1 capsule by mouth 3 (three) times daily. (Patient not taking: Reported on 09/04/2023)     No current facility-administered medications for this visit.     Musculoskeletal: Strength & Muscle Tone: within normal limits Gait & Station: normal Patient leans: N/A  Psychiatric Specialty Exam: Review of Systems  Psychiatric/Behavioral:  Positive for sleep disturbance.        Skin picking    Blood pressure 112/78, pulse (!) 102, temperature 97.8 F (36.6 C), temperature source Temporal, height 5' 5 (1.651 m), weight (!) 336 lb (152.4 kg), SpO2 99%.Body mass index is 55.91 kg/m.  General Appearance: Casual  Eye Contact:  Fair  Speech:  Clear and Coherent  Volume:  Normal  Mood:  Euthymic  Affect:  Congruent  Thought Process:  Goal Directed and Descriptions of Associations: Intact  Orientation:  Full (Time, Place, and Person)  Thought Content: Logical   Suicidal Thoughts:  No  Homicidal Thoughts:  No  Memory:  Immediate;   Fair Recent;   Fair Remote;   Fair  Judgement:  Fair  Insight:  Fair  Psychomotor Activity:  Normal  Concentration:  Concentration: Fair and Attention Span: Fair  Recall:  [employer] of Knowledge: Fair  Language: Fair  Akathisia:  No  Handed:  Right  AIMS (if indicated): not done  Assets:  Communication Skills Desire for Improvement Housing  ADL's:  Intact  Cognition: WNL  Sleep:  varies   Screenings: AIMS    Flowsheet Row Video Visit from 06/13/2021 in [facility]  Office Visit from 05/05/2021 in [facility]  AIMS Total Score 0 0   GAD-7    Flowsheet Row Office Visit from 09/04/2023 in [facility] Office Visit from 05/08/2023 in [facility] Office Visit from 01/24/2023 in [facility] Office Visit from 01/15/2023 in [facility] from 01/10/2023 in [facility]  Total GAD-7 Score 5 0 0 0 14   PHQ2-9    Flowsheet Row Office Visit from 09/04/2023 in [facility] Procedure visit from 08/14/2023 in [facility] Procedure visit from 07/10/2023 in [facility] Visit from 06/27/2023 in [facility] Procedure visit from 05/27/2023 in [facility] Total Score 0 0 0 0 0   Flowsheet Row Office Visit from 09/04/2023 in [facility] Video Visit from 06/05/2023 in [facility] Video Visit from 03/26/2023 in [facility]  C-SSRS RISK CATEGORY No Risk No Risk No Risk     Assessment and Plan: Richa L Schwieger is a 43 year old Caucasian female who has a history of PTSD, borderline personality disorder, depression was evaluated  in office today.  Discussed assessment and plan as noted below.  Generalized anxiety disorder-stable Currently reports overall mood symptoms are stable on the current medication regimen.  Currently engaged in DBT group therapy. Continue Lexapro  20 mg daily Continue Buspirone  30 mg twice daily Continue Wellbutrin  150 mg daily Discontinue  Clonazepam , noncompliant. Continue psychotherapy sessions, currently in DBT groups with Ms. Veva.  Posttraumatic stress disorder-improving Currently reports well managed on the current medication regimen Continue Lexapro  20 mg daily  Skin picking disorder-unstable Continues to have exacerbation of skin picking on and off. Encouraged to establish care with the therapist Discussed coping strategies, distraction techniques. Patient to discuss with Ms.Gainey in group therapy as well. Continue Lexapro  and BuSpar  as prescribed  Borderline personality disorder Currently in DBT groups which has been beneficial Encouraged to continue group therapy  Alcohol use disorder in remission Sober since 2017 Currently denies any use  Will benefit from repeat TSH levels, patient to get this completed at next visit with primary care.  Follow-up Follow-up in clinic in 3 to 4 months or sooner if needed.   Collaboration of Care: Collaboration of Care: Referral or follow-up with counselor/therapist AEB encouraged to continue psychotherapy sessions/DBT groups  Patient/Guardian was advised Release of Information must be obtained prior to any record release in order to collaborate their care with an outside provider. Patient/Guardian was advised if they have not already done so to contact the registration department to sign all necessary forms in order for us  to release information regarding their care.   Consent: Patient/Guardian gives verbal consent for treatment and assignment of benefits for services provided during this visit. Patient/Guardian expressed understanding and agreed to proceed.   This note was generated in part or whole with voice recognition software. Voice recognition is usually quite accurate but there are transcription errors that can and very often do occur. I apologize for any typographical errors that were not detected and corrected.    Mauricia Mertens, MD 09/04/2023, 2:41 PM

## 2023-09-04 NOTE — Progress Notes (Signed)
 Name: Barbara Pennington   MRN: [id number]    DOB: [birth year]   Date:09/04/2023       Progress Note  Subjective  Chief Complaint  Chief Complaint  Patient presents with   Medical Management of Chronic Issues   Discussed the use of AI scribe software for clinical note transcription with the patient, who gave verbal consent to proceed.  History of Present Illness Barbara Pennington is a 43 year old female with morbid obesity and recurrent depression who presents for a three-month follow-up on weight loss medication.  She has a long history of obesity, with her weight reaching as high as 356 pounds in January 2004. Her weight has fluctuated over the years, and she has tried various diets such as Atkins and Weight Watchers with limited success. She was previously on Wegovy , which helped her reduce her weight to 325 pounds, but she experienced a delay in medication refills due to insurance issues. She recently completed the 0.25 mg dose and has started the 0.5 mg dose. Her current weight is 336 pounds. She is attempting to monitor her diet by eating more fresh or frozen vegetables, cutting out sodium, and reducing sweets, although she has not complied with her diet this week due to her mother's visit.  She is being treated for recurrent moderate depression and is currently on bupropion  150 mg, buspirone  as needed, acetylcholine 20 mg, and modafinil . There have been no changes in her psychiatric diagnosis or medications recently. She is seeing a psychiatrist.  She has a history of obstructive sleep apnea and uses a CPAP machine every night. She also has a history of chronic kidney disease stage 3A, but her kidney function has normalized. She is on atorvastatin  for mixed dyslipidemia and reports no muscle problems. Her blood pressure is well-controlled with amlodipine  2.5 mg and valsartan  HCTZ 160/25 mg. She experiences occasional chest pain and palpitations, but no recent episodes of supraventricular  tachycardia.  Her GERD is generally under control, although she chewed on two Tums earlier this week due to dietary indiscretions. She has a history of hyperparathyroidism and elevated CRP, which are being monitored.  She has osteoarthritis and takes Celebrex  daily for knee pain. She has a history of a torn meniscus and uses ice for pain management.  She mentions skin picking issues, which start as pimples that itch and hurt, leading her to pick at them. She also reports vaginal itching and suspects a yeast infection, for which she has been using Vagisil.    Patient Active Problem List   Diagnosis Date Noted   SVT (supraventricular tachycardia) (HCC) 05/31/2023   Mild intermittent asthma without complication 05/31/2023   Chronic pain of left knee 05/09/2023   Status post medial meniscus repair (RIGHT) 05/09/2023   Chronic cough 02/13/2022   Skin-picking disorder 12/19/2021   Adjustment disorder with depressed mood 06/13/2021   Closed nondisplaced longitudinal fracture of right patella 06/09/2021   GAD (generalized anxiety disorder) 05/05/2021   Depressive disorder 05/05/2021   History of borderline personality disorder 05/05/2021   History of alcohol abuse 05/05/2021   Internal derangement of left knee 04/03/2021   Arthritis of left knee 04/03/2021   Metabolic syndrome 03/24/2021   Impaired fasting glucose 12/06/2020   Lymphedema 12/06/2020   Moderate episode of recurrent major depressive disorder (HCC) 02/08/2020   CKD (chronic kidney disease) stage 3, GFR 30-59 ml/min (HCC) 06/11/2019   PTSD (post-traumatic stress disorder) 01/14/2019   Borderline personality disorder (HCC) 01/14/2019   Anxiety 01/14/2019  Mixed hyperlipidemia 01/14/2019   Morbid obesity with BMI of 50.0-59.9, adult (HCC) 01/14/2019   OSA on CPAP 01/14/2019   Asthma, mild persistent 04/30/2018   Low back pain 08/20/2017   Fibroadenoma of breast, 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   Essential hypertension     Past Surgical  History:  Procedure Laterality Date   ABDOMINAL HYSTERECTOMY  01/08/2006   BREAST BIOPSY Left 01/09/2011   CORE - FIBROADENOMA VS PHYLLODES   CESAREAN SECTION  01/08/2005   COLONOSCOPY WITH PROPOFOL  N/A 10/09/2016   Procedure: COLONOSCOPY WITH PROPOFOL ;  Surgeon: Therisa Bi, MD;  Location: [facility] ENDOSCOPY;  Service: Gastroenterology;  Laterality: N/A;   COLONOSCOPY WITH PROPOFOL  N/A 11/09/2019   Procedure: COLONOSCOPY WITH PROPOFOL ;  Surgeon: Unk Corinn Skiff, MD;  Location: [facility] ENDOSCOPY;  Service: Gastroenterology;  Laterality: N/A;   ESOPHAGOGASTRODUODENOSCOPY     GUM SURGERY  01/08/2002   KNEE ARTHROSCOPY WITH MEDIAL MENISECTOMY Right 07/24/2021   Procedure: Right medial meniscus root repair and patella chondroplasty;  Surgeon: Tobie Priest, MD;  Location: [facility] ORS;  Service: Orthopedics;  Laterality: Right;   PLANTAR FASCIA RELEASE Left 08/19/2014   Procedure: ENDOSCOPIC PLANTAR FASCIOTOMY RELEASE L WITH TOPAZ;  Surgeon: Donnice Cory, DPM;  Location: [facility];  Service: Podiatry;  Laterality: Left;  LMA WITH POPLITEAL BLOCK   TONSILLECTOMY  01/08/1982    Family History  Problem Relation Age of Onset   Obesity Mother    Diabetes Mother    High Cholesterol Mother    Hypertension Mother    Neuropathy Mother    Depression Mother    Anxiety disorder Mother    Asthma Mother    Arthritis Mother    Obesity Father    Hypertension Father    High Cholesterol Father    Depression Father    Anxiety disorder Father    Seizures Brother    Testicular cancer Brother    Cancer Brother    Breast cancer Maternal Grandmother    Hypertension Maternal Grandmother    Aneurysm Maternal Grandmother    Stroke Maternal Grandmother    Depression Maternal Grandmother    Varicose Veins Maternal Grandmother    Hypertension Maternal Grandfather    High Cholesterol Maternal Grandfather    Heart disease Maternal Grandfather        has a pacemaker   Stroke Maternal Grandfather     Alzheimer's disease Paternal Grandmother    Dementia Paternal Grandmother    Cancer Paternal Grandmother    Colon cancer Paternal Grandfather    Cancer Paternal Grandfather    Mental illness Maternal Aunt    Suicidality Cousin     Social History   Tobacco Use   Smoking status: Former    Current packs/day: 0.00    Average packs/day: 0.3 packs/day for 10.0 years (2.5 ttl pk-yrs)    Types: Cigarettes    Start date: 07/04/2004    Quit date: 07/05/2014    Years since quitting: 9.1   Smokeless tobacco: Never  Substance Use Topics   Alcohol use: Yes    Comment: rare     Current Outpatient Medications:    acetaminophen  (TYLENOL ) 500 MG tablet, Take 1,000 mg by mouth every 6 (six) hours as needed for moderate pain (pain score 4-6)., Disp: , Rfl:    Albuterol -Budesonide  (AIRSUPRA ) 90-80 MCG/ACT AERO, Inhale 2 puffs into the lungs 4 (four) times daily as needed., Disp: 10.7 g, Rfl: 1   amLODipine  (NORVASC ) 2.5 MG tablet, Take 1 tablet (2.5 mg total) by mouth daily., Disp: 90 tablet,  Rfl: 1   atorvastatin  (LIPITOR) 20 MG tablet, Take 1 tablet (20 mg total) by mouth every morning., Disp: 90 tablet, Rfl: 1   budesonide -formoterol  (SYMBICORT ) 160-4.5 MCG/ACT inhaler, Inhale 2 puffs into the lungs in the morning and at bedtime., Disp: 1 each, Rfl: 12   buPROPion  (WELLBUTRIN  XL) 150 MG 24 hr tablet, TAKE 1 TABLET (150 MG TOTAL) BY MOUTH DAILY WITH BREAKFAST. STOP WELLBUTRIN  XL 300 MG, Disp: 90 tablet, Rfl: 0   busPIRone  (BUSPAR ) 30 MG tablet, TAKE 1 TABLET BY MOUTH 2 TIMES DAILY., Disp: 180 tablet, Rfl: 1   celecoxib  (CELEBREX ) 100 MG capsule, TAKE 1 CAPSULE BY MOUTH TWICE A DAY, Disp: 60 capsule, Rfl: 0   desloratadine  (CLARINEX ) 5 MG tablet, Take 1 tablet (5 mg total) by mouth daily., Disp: 90 tablet, Rfl: 1   escitalopram  (LEXAPRO ) 20 MG tablet, TAKE 1 TABLET BY MOUTH EVERY DAY, Disp: 90 tablet, Rfl: 1   fluticasone  (FLONASE ) 50 MCG/ACT nasal spray, Place 1 spray into both nostrils daily., Disp:  48 mL, Rfl: 1   Melatonin 10 MG CAPS, Take 10-20 mg by mouth at bedtime., Disp: , Rfl:    meloxicam  (MOBIC ) 15 MG tablet, Take 15 mg by mouth daily., Disp: , Rfl:    modafinil  (PROVIGIL ) 100 MG tablet, Take 1 tablet (100 mg total) by mouth daily., Disp: 90 tablet, Rfl: 0   montelukast  (SINGULAIR ) 10 MG tablet, Take 1 tablet (10 mg total) by mouth at bedtime., Disp: 90 tablet, Rfl: 1   Semaglutide -Weight Management (WEGOVY ) 0.5 MG/0.5ML SOAJ, Inject 0.5 mg into the skin once a week., Disp: 2 mL, Rfl: 0   valsartan -hydrochlorothiazide  (DIOVAN -HCT) 160-25 MG tablet, TAKE 1 TABLET BY MOUTH EVERY DAY, Disp: 30 tablet, Rfl: 0   gabapentin  (NEURONTIN ) 300 MG capsule, Take 1 capsule by mouth 3 (three) times daily. (Patient not taking: Reported on 09/04/2023), Disp: , Rfl:    Semaglutide -Weight Management (WEGOVY ) 0.25 MG/0.5ML SOAJ, Inject 0.25 mg into the skin once a week. (Patient not taking: Reported on 09/04/2023), Disp: 2 mL, Rfl: 0  Allergies  Allergen Reactions   Wound Dressing Adhesive    Latex Rash   Metformin And Related     diarrhea    I personally reviewed active problem list, medication list, allergies with the patient/caregiver today.   ROS  Ten systems reviewed and is negative except as mentioned in HPI    Objective Physical Exam VITALS: BP- 128/76 CONSTITUTIONAL: Patient appears well-developed and well-nourished.  No distress. HEENT: Head atraumatic, normocephalic, neck supple. CARDIOVASCULAR: Normal rate, regular rhythm and normal heart sounds.  No murmur heard. No BLE edema. PULMONARY: Effort normal and breath sounds normal. No respiratory distress. ABDOMINAL: There is no tenderness or distention. MUSCULOSKELETAL: Normal gait. Without gross motor or sensory deficit. PSYCHIATRIC: Patient has a normal mood and affect. behavior is normal. Judgment and thought content normal.  Vitals:   09/04/23 1523  BP: 128/76  Pulse: 100  Resp: 16  SpO2: 97%  Weight: (!) 335 lb 4.8 oz  (152.1 kg)  Height: 5' 5 (1.651 m)    Body mass index is 55.8 kg/m.  Recent Results (from the past 2160 hours)  Nitric oxide      Status: None   Collection Time: 07/10/23  9:06 AM  Result Value Ref Range   Nitric Oxide  23     Diabetic Foot Exam:     PHQ2/9:    09/04/2023    3:19 PM 09/04/2023    8:55 AM 08/14/2023    7:59  AM 07/10/2023    2:05 PM 06/27/2023   11:49 AM  Depression screen PHQ 2/9  Decreased Interest 0  0 0 0  Down, Depressed, Hopeless 0  0 0 0  PHQ - 2 Score 0  0 0 0  Altered sleeping 0      Tired, decreased energy 0      Change in appetite 0      Feeling bad or failure about yourself  0      Trouble concentrating 0      Moving slowly or fidgety/restless 0      Suicidal thoughts 0      PHQ-9 Score 0      Difficult doing work/chores Not difficult at all         Information is confidential and restricted. Go to Review Flowsheets to unlock data.    phq 9 is negative  Fall Risk:    09/04/2023    3:18 PM 08/14/2023    7:59 AM 07/10/2023    2:05 PM 06/27/2023   11:48 AM 05/31/2023    2:56 PM  Fall Risk   Falls in the past year? 1 1 1 1  0  Number falls in past yr: 0 1 1 1  0  Injury with Fall? 0 0 0 0 0  Risk for fall due to : Impaired balance/gait    No Fall Risks  Follow up Falls evaluation completed    Falls prevention discussed;Education provided;Falls evaluation completed    Assessment & Plan Obesity managed with anti-obesity pharmacotherapy Weight regain after initial loss with Wegovy . Current weight 336 lbs. Compliance with diet is partial. Discussed need for documentation for insurance. - Continue Wegovy , increase to 1 mg and then 1.7 mg as per titration schedule. - Document weight loss efforts and dietary compliance. - Encourage healthy eating habits, reducing sodium and sweets. - Schedule follow-up in three months for weight documentation.  Major depressive disorder, recurrent, moderate Well-managed on current medications. - Psychiatrist plans  to gradually decrease medications once stable.  Obstructive sleep apnea Managed with CPAP therapy.  Chronic kidney disease, stage 3a Kidney function normalized. - Order blood work to monitor kidney function.  Hyperparathyroidism Previously elevated parathyroid  levels, possibly related to kidney function. - Order blood work to check parathyroid  levels.  Mixed hyperlipidemia Improved with atorvastatin . - Order lipid panel to monitor cholesterol levels.  Hypertension Blood pressure well-controlled with current regimen. - Continue current antihypertensive regimen.  Lymphedema Intermittent exacerbations.  Gastroesophageal reflux disease Occasional symptoms managed with Tums.  Osteoarthritis of knee Chronic knee pain managed with Celebrex , advised against daily use due to potential kidney and stomach issues. - Discontinue daily Celebrex  use. - Use Tylenol  and ice for pain management. - Avoid daily use of NSAIDs due to kidney disease history.  Elevated C-reactive protein Previously elevated CRP, requires monitoring. - Order CRP level to monitor inflammation.  Vaginal pruritus Itching without discharge, possibly a yeast infection. Vagisil provides relief. - Prescribe Diflucan , take every other day for two doses.  Recurrent skin infections Pimples that itch and lead to picking, possibly impetigo. - Prescribe mupirocin  cream for skin infection. - Advise against picking at skin.

## 2023-09-05 LAB — HEMOGLOBIN A1C
Hgb A1c MFr Bld: 5.6 % (ref <5.7)
Mean Plasma Glucose: 114 mg/dL
eAG (mmol/L): 6.3 mmol/L

## 2023-09-05 LAB — CBC WITH DIFFERENTIAL/PLATELET
Absolute Lymphocytes: 2519 {cells}/uL (ref 850–3900)
Absolute Monocytes: 599 {cells}/uL (ref 200–950)
Basophils Absolute: 57 {cells}/uL (ref 0–200)
Basophils Relative: 0.7 %
Eosinophils Absolute: 478 {cells}/uL (ref 15–500)
Eosinophils Relative: 5.9 %
HCT: 37.6 % (ref 35.0–45.0)
Hemoglobin: 12.1 g/dL (ref 11.7–15.5)
MCH: 30.6 pg (ref 27.0–33.0)
MCHC: 32.2 g/dL (ref 32.0–36.0)
MCV: 94.9 fL (ref 80.0–100.0)
MPV: 9.9 fL (ref 7.5–12.5)
Monocytes Relative: 7.4 %
Neutro Abs: 4447 {cells}/uL (ref 1500–7800)
Neutrophils Relative %: 54.9 %
Platelets: 246 Thousand/uL (ref 140–400)
RBC: 3.96 Million/uL (ref 3.80–5.10)
RDW: 12.9 % (ref 11.0–15.0)
Total Lymphocyte: 31.1 %
WBC: 8.1 Thousand/uL (ref 3.8–10.8)

## 2023-09-05 LAB — COMPREHENSIVE METABOLIC PANEL WITH GFR
AG Ratio: 2.2 (calc) (ref 1.0–2.5)
ALT: 24 U/L (ref 6–29)
AST: 11 U/L (ref 10–30)
Albumin: 3.9 g/dL (ref 3.6–5.1)
Alkaline phosphatase (APISO): 42 U/L (ref 31–125)
BUN/Creatinine Ratio: 9 (calc) (ref 6–22)
BUN: 12 mg/dL (ref 7–25)
CO2: 33 mmol/L — ABNORMAL HIGH (ref 20–32)
Calcium: 9.7 mg/dL (ref 8.6–10.2)
Chloride: 105 mmol/L (ref 98–110)
Creat: 1.38 mg/dL — ABNORMAL HIGH (ref 0.50–0.99)
Globulin: 1.8 g/dL — ABNORMAL LOW (ref 1.9–3.7)
Glucose, Bld: 109 mg/dL — ABNORMAL HIGH (ref 65–99)
Potassium: 3.7 mmol/L (ref 3.5–5.3)
Sodium: 147 mmol/L — ABNORMAL HIGH (ref 135–146)
Total Bilirubin: 0.4 mg/dL (ref 0.2–1.2)
Total Protein: 5.7 g/dL — ABNORMAL LOW (ref 6.1–8.1)
eGFR: 48 mL/min/1.73m2 — ABNORMAL LOW (ref >=60)

## 2023-09-05 LAB — PTH, INTACT AND CALCIUM
Calcium: 9.7 mg/dL (ref 8.6–10.2)
PTH: 30 pg/mL (ref 8.6–77)

## 2023-09-05 LAB — LIPID PANEL
Cholesterol: 147 mg/dL (ref <200)
HDL: 43 mg/dL — ABNORMAL LOW (ref >=50)
LDL Cholesterol (Calc): 82 mg/dL
Non-HDL Cholesterol (Calc): 104 mg/dL (ref <130)
Total CHOL/HDL Ratio: 3.4 (calc) (ref <5.0)
Triglycerides: 123 mg/dL (ref <150)

## 2023-09-05 LAB — B12 AND FOLATE PANEL
Folate: 17.7 ng/mL
Vitamin B-12: 1423 pg/mL — ABNORMAL HIGH (ref 200–1100)

## 2023-09-05 LAB — HEPATITIS B SURFACE ANTIBODY,QUALITATIVE: Hep B S Ab: REACTIVE — AB

## 2023-09-05 LAB — VITAMIN D 25 HYDROXY (VIT D DEFICIENCY, FRACTURES): Vit D, 25-Hydroxy: 26 ng/mL — ABNORMAL LOW (ref 30–100)

## 2023-09-05 LAB — C-REACTIVE PROTEIN: CRP: 6.8 mg/L (ref <8.0)

## 2023-09-06 ENCOUNTER — Ambulatory Visit: Admitting: Professional Counselor

## 2023-09-06 ENCOUNTER — Ambulatory Visit: Payer: Self-pay

## 2023-09-06 NOTE — Telephone Encounter (Signed)
 FYI Only or Action Required?: Action required by provider: update on patient condition.  Patient was last seen in primary care on 09/04/2023 by Glenard Mire, MD.  Called Nurse Triage reporting Medication Problem.  Symptoms began yesterday.  Interventions attempted: Prescription medications: fluconazole  (DIFLUCAN ) 150 MG.  Symptoms are: unchanged.  Triage Disposition: Call PCP When Office is Open  Patient/caregiver understands and will follow disposition?: Yes        Reason for Disposition  Caller wants to use a complementary or alternative medicine  Answer Assessment - Initial Assessment Questions 1. NAME of MEDICINE: What medicine(s) are you calling about?     fluconazole  (DIFLUCAN ) 150 MG tablet 2. QUESTION: What is your question? (e.g., double dose of medicine, side effect)     Question about side effects Pt wants to stop medication d/t side effects-- Triager advised to confirm with pharmacist. 3. PRESCRIBER: Who prescribed the medicine? Reason: if prescribed by specialist, call should be referred to that group.     PCP 4. SYMPTOMS: Do you have any symptoms? If Yes, ask: What symptoms are you having?  How bad are the symptoms (e.g., mild, moderate, severe)     N/V, egg smelling burps 5. PREGNANCY:  Is there any chance that you are pregnant? When was your last menstrual period?     Denies  Protocols used: Medication Question Call-A-AH

## 2023-09-06 NOTE — Telephone Encounter (Signed)
 Message from [place] T sent at 09/06/2023  3:45 PM EDT  fluconazole  (DIFLUCAN ) 150 MG tablet- has been very nauseous, vomiting, diarrhea, rotten egg smelling burps, very tired and weak, started after taking the medication- [phone]

## 2023-09-07 ENCOUNTER — Ambulatory Visit: Payer: Self-pay | Admitting: Family Medicine

## 2023-09-10 ENCOUNTER — Encounter: Payer: Self-pay | Admitting: Family Medicine

## 2023-09-10 ENCOUNTER — Telehealth: Payer: Self-pay

## 2023-09-10 NOTE — Telephone Encounter (Signed)
 Not our pt. Forwarding to the pts. PCP.   Copied from CRM #[id number]. Topic: Clinical - Prescription Issue >> Sep 10, 2023 11:40 AM [name] wrote: Reason for CRM: patient call about fluconazole  (DIFLUCAN ) 150 MG tablet saying that she needs something other than this rx please call pt back [phone] (H

## 2023-09-10 NOTE — Telephone Encounter (Signed)
 Copied from CRM #[id number]. Topic: General - Call Back - No Documentation >> Sep 10, 2023 11:41 AM [name] wrote: Reason for CRM: patient is calling about fmla paperwork she has questions about her paperwork please call patient back [phone] (H)

## 2023-09-10 NOTE — Telephone Encounter (Signed)
 Called pt back no answer left brief detailed vm to return call.

## 2023-09-12 ENCOUNTER — Ambulatory Visit: Admitting: Professional Counselor

## 2023-09-13 ENCOUNTER — Encounter: Payer: Self-pay | Admitting: Family Medicine

## 2023-09-13 ENCOUNTER — Ambulatory Visit: Admitting: Professional Counselor

## 2023-09-18 ENCOUNTER — Ambulatory Visit: Admitting: Professional Counselor

## 2023-09-18 DIAGNOSIS — F424 Excoriation (skin-picking) disorder: Secondary | ICD-10-CM

## 2023-09-18 DIAGNOSIS — F603 Borderline personality disorder: Secondary | ICD-10-CM

## 2023-09-18 DIAGNOSIS — F4310 Post-traumatic stress disorder, unspecified: Secondary | ICD-10-CM

## 2023-09-18 DIAGNOSIS — F411 Generalized anxiety disorder: Secondary | ICD-10-CM

## 2023-09-18 NOTE — Progress Notes (Unsigned)
  THERAPIST PROGRESS NOTE  Virtual Visit via Video Note  I connected with Barbara Pennington on 09/19/23 at  2:00 PM EDT by a video enabled telemedicine application and verified that I am speaking with the correct person using two identifiers.  Location: Patient: Home Provider: Office   I discussed the limitations of evaluation and management by telemedicine and the availability of in person appointments. The patient expressed understanding and agreed to proceed.   I discussed the assessment and treatment plan with the patient. The patient was provided an opportunity to ask questions and all were answered. The patient agreed with the plan and demonstrated an understanding of the instructions.   The patient was advised to call back or seek an in-person evaluation if the symptoms worsen or if the condition fails to improve as anticipated.  I provided 57 minutes of non-face-to-face time during this encounter. Barbara Pennington, [facility]  Session Time: 2:02 PM - 2:59 PM  Participation Level: Active  Behavioral Response: Casual, Alert, Anxious and Dysphoric  Type of Therapy: Individual Therapy  Treatment Goals addressed: Active Anxiety  LTG: I guess working through all the issues from the past. Learning how to better cope with stress.                Start:  02/05/23   End:  02/04/24   STG: I don't deal with stress well. I get overwhelmed. I shut down. I get angry. I can't communicate my thoughts. To improve knowledge of DBT skills to reduce sxs per self report over the next 12 weeks.   ProgressTowards Goals: Progressing  Interventions: CBT, Motivational Interviewing, Strength-based, and Supportive  Summary: Barbara Pennington is a 43 y.o. female who presents with a history of anxiety, depression, trauma, BPD, and skin-picking disorder. She appeared somber but oriented x5. She reported she has had a rough few weeks. Barbara Pennington reported work has been very stressful. Barbara Pennington was tearful throughout  session. She struggles to identify her strengths and areas of progress when her emotions overwhelm her. She was receptive to input from Conservation officer, nature and agreed mindfulness skills will be helpful. She identified a plan to take a mindful walk with her partner while grocery shopping. Barbara Pennington will try to prioritize group so she can continue learning DBT skills to help with emotion regulation and distress tolerance.   Therapist Response: Conducted session with Barbara Pennington. Began session with check-in/update since previous session. Utilized empathetic and reflective listening. Used open-ended questions to facilitate discussion and summarized Barbara Pennington's thoughts/feelings. Explored strengths and reminded Barbara Pennington of areas of progress. Reviewed mindfulness skills as easy way to practice grounding and regulation. Encouraged Barbara Pennington to prioritize group to continue learning emotion regulation and distress tolerance skills. Scheduled additional appointment and concluded session.   Suicidal/Homicidal: No  Plan: Return again in 2 weeks.  Diagnosis: Borderline personality disorder (HCC)  PTSD (post-traumatic stress disorder)  GAD (generalized anxiety disorder)  Skin-picking disorder  Collaboration of Care: Medication Management AEB chart review  Patient/Guardian was advised Release of Information must be obtained prior to any record release in order to collaborate their care with an outside provider. Patient/Guardian was advised if they have not already done so to contact the registration department to sign all necessary forms in order for us  to release information regarding their care.   Consent: Patient/Guardian gives verbal consent for treatment and assignment of benefits for services provided during this visit. Patient/Guardian expressed understanding and agreed to proceed.   Barbara Pennington, [facility] Barbara Pennington

## 2023-09-20 ENCOUNTER — Ambulatory Visit: Admitting: Professional Counselor

## 2023-09-20 DIAGNOSIS — F603 Borderline personality disorder: Secondary | ICD-10-CM

## 2023-09-20 DIAGNOSIS — F411 Generalized anxiety disorder: Secondary | ICD-10-CM

## 2023-09-20 DIAGNOSIS — F4310 Post-traumatic stress disorder, unspecified: Secondary | ICD-10-CM | POA: Diagnosis not present

## 2023-09-20 DIAGNOSIS — F424 Excoriation (skin-picking) disorder: Secondary | ICD-10-CM

## 2023-09-23 NOTE — Telephone Encounter (Unsigned)
 Copied from CRM [id number]. Topic: General - Other >> Sep 23, 2023 10:49 AM Zebedee SAUNDERS wrote: Reason for CRM: Pt called stated FMLA forms were not received by Guardian ph: [phone], per her communication via MyChart they were sent but Guardian yet to receive them. Please fax to [phone]. Please confirm with pt via MyChart that they have been resent.

## 2023-09-23 NOTE — Progress Notes (Signed)
  THERAPIST PROGRESS NOTE  Virtual Visit via Video Note  I connected with Barbara Pennington on 09/23/23 at 12:00 PM EDT by a video enabled telemedicine application and verified that I am speaking with the correct person using two identifiers.  Location: Patient: Community (Parked car) Provider: Remote office   I discussed the limitations of evaluation and management by telemedicine and the availability of in person appointments. The patient expressed understanding and agreed to proceed.   I discussed the assessment and treatment plan with the patient. The patient was provided an opportunity to ask questions and all were answered. The patient agreed with the plan and demonstrated an understanding of the instructions.   The patient was advised to call back or seek an in-person evaluation if the symptoms worsen or if the condition fails to improve as anticipated.  I provided 60 minutes of non-face-to-face time during this encounter. Barbara Pennington, [facility]  Session Time: 12:00 PM - 1:00 PM   Participation Level: Active  Behavioral Response: Well Groomed, Alert, Anxious and Dysphoric  Type of Therapy: Group Therapy  Interventions: CBT and Supportive  Summary: Barbara Pennington is a 43 y.o. female who presents with a history of anxiety, borderline personality disorder, and skin-picking disorder. She appeared somber but oriented x5. She reported she had a ver bad beginning of the week. She noted work has been a major stressor. Barbara Pennington was tearful at times during session. She reported her goal for this week is to be more mindful of myself, try to understand what my true emotions is and why I'm feeling it, and trying to share that with my partner.   Therapist Response: Conducted telehealth group session. Began session with introductions and review of group rules and expectations. Engaged group members in mindfulness exercise - guided meditation for letting go of anger. Conducted brief check-in with  each group member. Reviewed handouts and worksheets - Emotion regulation module - Envy and Jealousy. Passenger transport manager with each group member and goal setting between now and next session.   Suicidal/Homicidal: No  Diagnosis: Borderline personality disorder (HCC)  PTSD (post-traumatic stress disorder)  GAD (generalized anxiety disorder)  Skin-picking disorder  Patient/Guardian was advised Release of Information must be obtained prior to any record release in order to collaborate their care with an outside provider. Patient/Guardian was advised if they have not already done so to contact the registration department to sign all necessary forms in order for us  to release information regarding their care.   Consent: Patient/Guardian gives verbal consent for treatment and assignment of benefits for services provided during this visit. Patient/Guardian expressed understanding and agreed to proceed.   Barbara Pennington, [facility] 09/23/2023

## 2023-09-24 ENCOUNTER — Other Ambulatory Visit: Payer: Self-pay | Admitting: Family Medicine

## 2023-09-24 DIAGNOSIS — M1712 Unilateral primary osteoarthritis, left knee: Secondary | ICD-10-CM

## 2023-09-26 ENCOUNTER — Ambulatory Visit: Admitting: Professional Counselor

## 2023-09-27 ENCOUNTER — Ambulatory Visit (INDEPENDENT_AMBULATORY_CARE_PROVIDER_SITE_OTHER): Admitting: Professional Counselor

## 2023-09-27 DIAGNOSIS — F424 Excoriation (skin-picking) disorder: Secondary | ICD-10-CM

## 2023-09-27 DIAGNOSIS — F411 Generalized anxiety disorder: Secondary | ICD-10-CM

## 2023-09-27 DIAGNOSIS — F603 Borderline personality disorder: Secondary | ICD-10-CM | POA: Diagnosis not present

## 2023-09-27 DIAGNOSIS — F4310 Post-traumatic stress disorder, unspecified: Secondary | ICD-10-CM | POA: Diagnosis not present

## 2023-09-28 DIAGNOSIS — H409 Unspecified glaucoma: Secondary | ICD-10-CM | POA: Insufficient documentation

## 2023-09-28 NOTE — Progress Notes (Signed)
  THERAPIST PROGRESS NOTE  Virtual Visit via Video Note  I connected with Alania L Braddock on 09/28/23 at 12:00 PM EDT by a video enabled telemedicine application and verified that I am speaking with the correct person using two identifiers.  Location: Patient: Community (parked car) Provider: Remote office   I discussed the limitations of evaluation and management by telemedicine and the availability of in person appointments. The patient expressed understanding and agreed to proceed.   I discussed the assessment and treatment plan with the patient. The patient was provided an opportunity to ask questions and all were answered. The patient agreed with the plan and demonstrated an understanding of the instructions.   The patient was advised to call back or seek an in-person evaluation if the symptoms worsen or if the condition fails to improve as anticipated.  I provided 45 minutes of non-face-to-face time during this encounter. [name], [facility]  Session Time: 12:00 PM - 12:45 PM   Participation Level: Active  Behavioral Response: Casual, Alert, Dysphoric  Type of Therapy: Group Therapy  Interventions: CBT and Supportive  Summary: Calee L Stanforth is a 43 y.o. female who presents with a history of anxiety, depression, trauma, BPD, and skin picking disorder. She appeared somber but oriented x5. She stated she doesn't remember significantly bad parts of the week and was happy to report she still has her job, which she was worried about last week. Charlese was able to work on goal of sharing things with her partner. She disconnected from session prior to setting a goal for this week.   Therapist Response: Conducted telehealth group session. Began session with introductions and review of group rules and expectations. Engaged group members in mindfulness exercise - guided meditation for reducing jealousy and envy. Conducted brief check-in with each group member. Reviewed handouts and  worksheets - Emotion regulation module - Fear and happiness. Passenger transport manager with each group member and goal setting between now and next session.   Suicidal/Homicidal: No  Diagnosis: Borderline personality disorder (HCC)  PTSD (post-traumatic stress disorder)  GAD (generalized anxiety disorder)  Skin-picking disorder  Patient/Guardian was advised Release of Information must be obtained prior to any record release in order to collaborate their care with an outside provider. Patient/Guardian was advised if they have not already done so to contact the registration department to sign all necessary forms in order for us  to release information regarding their care.   Consent: Patient/Guardian gives verbal consent for treatment and assignment of benefits for services provided during this visit. Patient/Guardian expressed understanding and agreed to proceed.   [name], [facility] 09/28/2023

## 2023-10-02 ENCOUNTER — Ambulatory Visit: Admitting: Professional Counselor

## 2023-10-02 DIAGNOSIS — F411 Generalized anxiety disorder: Secondary | ICD-10-CM

## 2023-10-02 DIAGNOSIS — F4310 Post-traumatic stress disorder, unspecified: Secondary | ICD-10-CM | POA: Diagnosis not present

## 2023-10-02 DIAGNOSIS — F424 Excoriation (skin-picking) disorder: Secondary | ICD-10-CM | POA: Diagnosis not present

## 2023-10-02 DIAGNOSIS — F603 Borderline personality disorder: Secondary | ICD-10-CM | POA: Diagnosis not present

## 2023-10-02 NOTE — Progress Notes (Signed)
  THERAPIST PROGRESS NOTE  Virtual Visit via Video Note  I connected with Barbara Pennington on 10/02/23 at  2:00 PM EDT by a video enabled telemedicine application and verified that I am speaking with the correct person using two identifiers.  Location: Patient: Home Provider: Office   I discussed the limitations of evaluation and management by telemedicine and the availability of in person appointments. The patient expressed understanding and agreed to proceed.   I discussed the assessment and treatment plan with the patient. The patient was provided an opportunity to ask questions and all were answered. The patient agreed with the plan and demonstrated an understanding of the instructions.   The patient was advised to call back or seek an in-person evaluation if the symptoms worsen or if the condition fails to improve as anticipated.  I provided 57 minutes of non-face-to-face time during this encounter. Barbara Pennington, [facility]  Session Time: 2:03 PM - 3:00 PM  Participation Level: Active  Behavioral Response: Casual, Alert, Dysphoric  Type of Therapy: Individual Therapy  Treatment Goals addressed: Active Anxiety  LTG: I guess working through all the issues from the past. Learning how to better cope with stress.                Start:  02/05/23   End:  02/04/24   STG: I don't deal with stress well. I get overwhelmed. I shut down. I get angry. I can't communicate my thoughts. To improve knowledge of DBT skills to reduce sxs per self report over the next 12 weeks.   ProgressTowards Goals: Progressing  Interventions: CBT, Motivational Interviewing, Solution Focused, and Supportive  Summary: Barbara Pennington is a 43 y.o. female who presents with a history of anxiety, trauma, BPD, and skin picking disorder. She appeared alert and oriented x5. She stated, I find adulting to be very overwhelming. Barbara Pennington engaged in discussion about stressors, including finances. She noted how these  stressors bring up various emotions. Barbara Pennington is unsure about how to manage some of these stressors. She explored possible options and stated she would do some research on her own as well. [name] engaged in discussion about other issues, such as health concerns. She was in agreement to do find options for DBT apps as her goal for therapy this week.  Therapist Response: Conducted session with [name]. Began session with check-in/update since previous session. Utilized empathetic and reflective listening. Used open-ended questions to facilitate discussion and summarized Barbara Pennington's thoughts/feelings. Explored possible solutions to stressors and noted ways to change mindset to improve emotion regulation. Discusses other issues and assisted with identifying a goal for therapy this week. Scheduled additional appointment and concluded session.   Suicidal/Homicidal: No  Plan: Return again in 2 weeks.  Diagnosis: Borderline personality disorder (HCC)  PTSD (post-traumatic stress disorder)  GAD (generalized anxiety disorder)  Skin-picking disorder  Collaboration of Care: Medication Management AEB chart review  Patient/Guardian was advised Release of Information must be obtained prior to any record release in order to collaborate their care with an outside provider. Patient/Guardian was advised if they have not already done so to contact the registration department to sign all necessary forms in order for us  to release information regarding their care.   Consent: Patient/Guardian gives verbal consent for treatment and assignment of benefits for services provided during this visit. Patient/Guardian expressed understanding and agreed to proceed.   Barbara Pennington, [facility] 10/02/2023

## 2023-10-04 ENCOUNTER — Ambulatory Visit: Admitting: Professional Counselor

## 2023-10-04 DIAGNOSIS — F4310 Post-traumatic stress disorder, unspecified: Secondary | ICD-10-CM | POA: Diagnosis not present

## 2023-10-04 DIAGNOSIS — F603 Borderline personality disorder: Secondary | ICD-10-CM

## 2023-10-04 DIAGNOSIS — F424 Excoriation (skin-picking) disorder: Secondary | ICD-10-CM

## 2023-10-04 DIAGNOSIS — F411 Generalized anxiety disorder: Secondary | ICD-10-CM | POA: Diagnosis not present

## 2023-10-07 NOTE — Progress Notes (Signed)
  THERAPIST PROGRESS NOTE  Virtual Visit via Video Note  I connected with Barbara Pennington on 10/07/23 at 12:00 PM EDT by a video enabled telemedicine application and verified that I am speaking with the correct person using two identifiers.  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 The patient expressed understanding and agreed to proceed.   I discussed the assessment and treatment plan with the patient. The patient was provided an opportunity to ask questions and all were answered. The patient agreed with the plan and demonstrated an understanding of the instructions.   The patient was advised to call back or seek an in-person evaluation if the symptoms worsen or if the condition fails to improve as anticipated.  I provided 73 minutes of non-face-to-face time during this encounter. Barbara Pennington, [facility]  Session Time: 12:00 PM - 1:13 PM  Participation Level: Active  Behavioral Response: Well Groomed, Alert, Anxious and Dysphoric  Type of Therapy: Group Therapy  Interventions: CBT and Supportive  Summary: Barbara Pennington is a 43 y.o. female who presents with a history of anxiety, trauma, BPD, and skin-picking disorder. She appeared alert and oriented x5. She reported she has been trying to observe herself. She shared a struggle with gambling and wanting to be more mindful of this behavior. She reported her goal is to find some phone apps that will offer support to herself and other group memebers.   Therapist Response: Conducted telehealth group session. Began session with introductions and review of group rules and expectations. Engaged group members in mindfulness exercise - guided meditation for creating a joyful day. Conducted brief check-in with each group member. Reviewed handouts and worksheets - Emotion regulation module - Love.  Passenger transport manager with each group member and goal setting between now and next session.   Suicidal/Homicidal: No  Diagnosis: Borderline personality disorder (HCC)  PTSD (post-traumatic stress disorder)  GAD (generalized anxiety disorder)  Skin-picking disorder   Patient/Guardian was advised Release of Information must be obtained prior to any record release in order to collaborate their care with an outside provider. Patient/Guardian was advised if they have not already done so to contact the registration department to sign all necessary forms in order for us  to release information regarding their care.   Consent: Patient/Guardian gives verbal consent for treatment and assignment of benefits for services provided during this visit. Patient/Guardian expressed understanding and agreed to proceed.   Barbara JONETTA Ligas, [facility] 10/07/2023

## 2023-10-08 NOTE — Progress Notes (Unsigned)
 10/09/2023-No show

## 2023-10-09 ENCOUNTER — Ambulatory Visit (HOSPITAL_BASED_OUTPATIENT_CLINIC_OR_DEPARTMENT_OTHER): Admitting: Nurse Practitioner

## 2023-10-09 DIAGNOSIS — Z91199 Patient's noncompliance with other medical treatment and regimen due to unspecified reason: Secondary | ICD-10-CM

## 2023-10-09 DIAGNOSIS — G894 Chronic pain syndrome: Secondary | ICD-10-CM

## 2023-10-11 ENCOUNTER — Ambulatory Visit: Admitting: Professional Counselor

## 2023-10-11 DIAGNOSIS — F411 Generalized anxiety disorder: Secondary | ICD-10-CM

## 2023-10-11 DIAGNOSIS — F424 Excoriation (skin-picking) disorder: Secondary | ICD-10-CM | POA: Diagnosis not present

## 2023-10-11 DIAGNOSIS — F4310 Post-traumatic stress disorder, unspecified: Secondary | ICD-10-CM

## 2023-10-11 DIAGNOSIS — F603 Borderline personality disorder: Secondary | ICD-10-CM
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
## 2023-10-16 ENCOUNTER — Ambulatory Visit: Admitting: Professional Counselor

## 2023-10-16 DIAGNOSIS — F411 Generalized anxiety disorder: Secondary | ICD-10-CM | POA: Diagnosis not present

## 2023-10-16 DIAGNOSIS — F603 Borderline personality disorder: Secondary | ICD-10-CM | POA: Diagnosis not present

## 2023-10-16 DIAGNOSIS — F4310 Post-traumatic stress disorder, unspecified: Secondary | ICD-10-CM | POA: Diagnosis not present

## 2023-10-16 DIAGNOSIS — F424 Excoriation (skin-picking) disorder: Secondary | ICD-10-CM
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
## 2023-10-18 ENCOUNTER — Ambulatory Visit: Admitting: Professional Counselor

## 2023-10-25 ENCOUNTER — Other Ambulatory Visit: Payer: Self-pay | Admitting: Psychiatry

## 2023-10-25 ENCOUNTER — Ambulatory Visit: Admitting: Professional Counselor

## 2023-10-25 DIAGNOSIS — F4310 Post-traumatic stress disorder, unspecified: Secondary | ICD-10-CM

## 2023-10-30 ENCOUNTER — Ambulatory Visit (INDEPENDENT_AMBULATORY_CARE_PROVIDER_SITE_OTHER): Admitting: Professional Counselor

## 2023-10-30 DIAGNOSIS — F4310 Post-traumatic stress disorder, unspecified: Secondary | ICD-10-CM

## 2023-10-30 DIAGNOSIS — F411 Generalized anxiety disorder: Secondary | ICD-10-CM

## 2023-10-30 DIAGNOSIS — F424 Excoriation (skin-picking) disorder: Secondary | ICD-10-CM

## 2023-10-30 DIAGNOSIS — F603 Borderline personality disorder: Secondary | ICD-10-CM

## 2023-10-30 NOTE — Progress Notes (Signed)
 THERAPIST PROGRESS NOTE  Virtual Visit via Video Note  I connected with Barbara Pennington on 10/30/23 at  1:00 PM EDT by a video enabled telemedicine application and verified that I am speaking with the correct person using two identifiers.  Location: Patient: Home Provider: Office   I discussed the limitations of evaluation and management by telemedicine and the availability of in person appointments. The patient expressed understanding and agreed to proceed.   I discussed the assessment and treatment plan with the patient. The patient was provided an opportunity to ask questions and all were answered. The patient agreed with the plan and demonstrated an understanding of the instructions.   The patient was advised to call back or seek an in-person evaluation if the symptoms worsen or if the condition fails to improve as anticipated.  I provided 58 minutes of non-face-to-face time during this encounter. Barbara Pennington, [facility]  Session Time: 1:03 PM - 2:01 PM   Participation Level: Active  Behavioral Response: Casual, Alert, Dysphoric  Type of Therapy: Individual Therapy  Treatment Goals addressed: Active Anxiety  LTG: I guess working through all the issues from the past. Learning how to better cope with stress. (Progressing)                Start:  02/05/23    Expected End:  02/04/24    Goal Note Reviewed 10/16/23 - I think I'm dealing better with stress and I need to improve ways of dealing with stress on a day-to-day basis. How do you know when you're worked through?   STG: I don't deal with stress well. I get overwhelmed. I shut down. I get angry. I can't communicate my thoughts. To improve knowledge of DBT skills to reduce sxs per self report over the next 12 weeks.  (Progressing)  Goal Note Reviewed 10/16/23 - Making little headway. I think I'm always critical of myself. I feel I am communicating better. I'm able to put my thoughts into words in a more calm manner and I  think I need more practice.   STG: Holding my tongue. Dealing with my anger and dealing with emotions and where they come from. To improve emotion regulation AEB self-report increase in emotional intelligence and use of coping mechanisms to regulate emotions over the next 12 weeks.   ProgressTowards Goals: Progressing  Interventions: CBT and Supportive  Summary: Barbara Pennington is a 43 y.o. female who presents with a history of anxiety, depression, BPD, PTSD, and skin picking disorder. She appeared somber but oriented x5. She shared that she reached out to her children/children's father after continuing to receive notifications about her daughter missing school. She was able to have a conversation with them that lasted about two hours. She shared what they discussed, their reactions, and her own reaction. Barbara Pennington continues to struggle with forgiving herself for her past choices/behaviors. She actively listened to radical acceptance. She will look up some meditations to assist with them.   Therapist Response: Conducted session with Barbara Pennington. Began session with check-in/update since previous session. Utilized empathetic and reflective listening. Used open-ended questions to facilitate discussion and summarized Barbara Pennington's thoughts/feelings. Explained radical acceptance from distress tolerance module. Encouraged Barbara Pennington to use guided meditations to help practice this skill.  Scheduled additional appointment and concluded session.   Suicidal/Homicidal: No  Plan: Return again in 2 weeks.  Diagnosis: PTSD (post-traumatic stress disorder)  Borderline personality disorder (HCC)  GAD (generalized anxiety disorder)  Skin-picking disorder  Collaboration of Care: Medication Management AEB chart  review  Patient/Guardian was advised Release of Information must be obtained prior to any record release in order to collaborate their care with an outside provider. Patient/Guardian was advised if they have not already done so  to contact the registration department to sign all necessary forms in order for us  to release information regarding their care.   Consent: Patient/Guardian gives verbal consent for treatment and assignment of benefits for services provided during this visit. Patient/Guardian expressed understanding and agreed to proceed.   Barbara Pennington, [facility] 10/30/2023

## 2023-11-01 ENCOUNTER — Ambulatory Visit: Admitting: Professional Counselor

## 2023-11-08 ENCOUNTER — Ambulatory Visit: Admitting: Professional Counselor

## 2023-11-13 ENCOUNTER — Ambulatory Visit (INDEPENDENT_AMBULATORY_CARE_PROVIDER_SITE_OTHER): Admitting: Professional Counselor

## 2023-11-13 DIAGNOSIS — F4310 Post-traumatic stress disorder, unspecified: Secondary | ICD-10-CM | POA: Diagnosis not present

## 2023-11-13 DIAGNOSIS — F411 Generalized anxiety disorder: Secondary | ICD-10-CM | POA: Diagnosis not present

## 2023-11-13 DIAGNOSIS — F603 Borderline personality disorder: Secondary | ICD-10-CM

## 2023-11-13 DIAGNOSIS — F424 Excoriation (skin-picking) disorder: Secondary | ICD-10-CM

## 2023-11-13 NOTE — Progress Notes (Unsigned)
 THERAPIST PROGRESS NOTE  Virtual Visit via Video Note  I connected with Barbara Pennington on 11/14/23 at  1:00 PM EST by a video enabled telemedicine application and verified that I am speaking with the correct person using two identifiers.  Location: Patient: Home Provider: Office   I discussed the limitations of evaluation and management by telemedicine and the availability of in person appointments. The patient expressed understanding and agreed to proceed.  I discussed the assessment and treatment plan with the patient. The patient was provided an opportunity to ask questions and all were answered. The patient agreed with the plan and demonstrated an understanding of the instructions.   The patient was advised to call back or seek an in-person evaluation if the symptoms worsen or if the condition fails to improve as anticipated.  I provided 61 minutes of non-face-to-face time during this encounter. [name], [facility]  Session Time: 1:05 PM - 2:06 PM  Participation Level: Active  Behavioral Response: Casual, Alert, Dysphoric  Type of Therapy: Individual Therapy  Treatment Goals addressed:  Active Anxiety  LTG: I guess working through all the issues from the past. Learning how to better cope with stress. (Progressing)                Start:  02/05/23    Expected End:  02/04/24    Goal Note Reviewed 10/16/23 - I think I'm dealing better with stress and I need to improve ways of dealing with stress on a day-to-day basis. How do you know when you're worked through?   STG: I don't deal with stress well. I get overwhelmed. I shut down. I get angry. I can't communicate my thoughts. To improve knowledge of DBT skills to reduce sxs per self report over the next 12 weeks.  (Progressing)  Goal Note Reviewed 10/16/23 - Making little headway. I think I'm always critical of myself. I feel I am communicating better. I'm able to put my thoughts into words in a more calm manner and I  think I need more practice.   STG: Holding my tongue. Dealing with my anger and dealing with emotions and where they come from. To improve emotion regulation AEB self-report increase in emotional intelligence and use of coping mechanisms to regulate emotions over the next 12 weeks.   ProgressTowards Goals: Progressing  Interventions: Motivational Interviewing and Supportive  Summary: ZAN TRISKA is a 43 y.o. female who presents with a history of anxiety, trauma, BPD, and skin picking disorder. She appeared alert and oriented x5. She stated she's made some bad life choices. She shared what these choices were. Rashae engaged in discussion processing factors that led to these choices and explored how she plans to move forward. She was tearful at times during session. She noted it's been helpful sharing this with someone else. She was receptive to exercises to help connect with emotions and manage triggers.   Therapist Response: Conducted session with Adaleah. Began session with check-in/update since previous session. Utilized empathetic and reflective listening. Used open-ended questions to facilitate discussion and summarized Delynda's thoughts/feelings. Explored contributing factors and pre-existing vulnerabilities. Reminded Mallisa about control over her choices but not her consequences. Discussed potential safety concerns with sexual interactions. Shared somatic exercise to help connect with emotions and manage triggers. Scheduled additional appointment and concluded session.   Suicidal/Homicidal: No  Plan: Return again in 2 weeks.  Diagnosis: PTSD (post-traumatic stress disorder)  Borderline personality disorder (HCC)  GAD (generalized anxiety disorder)  Skin-picking disorder  Collaboration  of Care: Medication Management AEB chart review  Patient/Guardian was advised Release of Information must be obtained prior to any record release in order to collaborate their care with an outside provider.  Patient/Guardian was advised if they have not already done so to contact the registration department to sign all necessary forms in order for us  to release information regarding their care.   Consent: Patient/Guardian gives verbal consent for treatment and assignment of benefits for services provided during this visit. Patient/Guardian expressed understanding and agreed to proceed.   [name], [facility] 11/14/2023

## 2023-11-14 NOTE — Patient Instructions (Signed)
 Preventive Care 58-44 Years Old, Female  Preventive care refers to lifestyle choices and visits with your health care provider that can promote health and wellness. Preventive care visits are also called wellness exams.  What can I expect for my preventive care visit?  Counseling  Your health care provider may ask you questions about your:  Medical history, including:  Past medical problems.  Family medical history.  Pregnancy history.  Current health, including:  Menstrual cycle.  Method of birth control.  Emotional well-being.  Home life and relationship well-being.  Sexual activity and sexual health.  Lifestyle, including:  Alcohol, nicotine or tobacco, and drug use.  Access to firearms.  Diet, exercise, and sleep habits.  Work and work Astronomer.  Sunscreen use.  Safety issues such as seatbelt and bike helmet use.  Physical exam  Your health care provider will check your:  Height and weight. These may be used to calculate your BMI (body mass index). BMI is a measurement that tells if you are at a healthy weight.  Waist circumference. This measures the distance around your waistline. This measurement also tells if you are at a healthy weight and may help predict your risk of certain diseases, such as type 2 diabetes and high blood pressure.  Heart rate and blood pressure.  Body temperature.  Skin for abnormal spots.  What immunizations do I need?    Vaccines are usually given at various ages, according to a schedule. Your health care provider will recommend vaccines for you based on your age, medical history, and lifestyle or other factors, such as travel or where you work.  What tests do I need?  Screening  Your health care provider may recommend screening tests for certain conditions. This may include:  Lipid and cholesterol levels.  Diabetes screening. This is done by checking your blood sugar (glucose) after you have not eaten for a while (fasting).  Pelvic exam and Pap test.  Hepatitis B test.  Hepatitis C  test.  HIV (human immunodeficiency virus) test.  STI (sexually transmitted infection) testing, if you are at risk.  Lung cancer screening.  Colorectal cancer screening.  Mammogram. Talk with your health care provider about when you should start having regular mammograms. This may depend on whether you have a family history of breast cancer.  BRCA-related cancer screening. This may be done if you have a family history of breast, ovarian, tubal, or peritoneal cancers.  Bone density scan. This is done to screen for osteoporosis.  Talk with your health care provider about your test results, treatment options, and if necessary, the need for more tests.  Follow these instructions at home:  Eating and drinking    Eat a diet that includes fresh fruits and vegetables, whole grains, lean protein, and low-fat dairy products.  Take vitamin and mineral supplements as recommended by your health care provider.  Do not drink alcohol if:  Your health care provider tells you not to drink.  You are pregnant, may be pregnant, or are planning to become pregnant.  If you drink alcohol:  Limit how much you have to 0-1 drink a day.  Know how much alcohol is in your drink. In the [place] one drink equals one 12 oz bottle of beer (355 mL), one 5 oz glass of wine (148 mL), or one 1 oz glass of hard liquor (44 mL).  Lifestyle  Brush your teeth every morning and night with fluoride toothpaste. Floss one time each day.  Exercise for at least  30 minutes 5 or more days each week.  Do not use any products that contain nicotine or tobacco. These products include cigarettes, chewing tobacco, and vaping devices, such as e-cigarettes. If you need help quitting, ask your health care provider.  Do not use drugs.  If you are sexually active, practice safe sex. Use a condom or other form of protection to prevent STIs.  If you do not wish to become pregnant, use a form of birth control. If you plan to become pregnant, see your health care provider for a  prepregnancy visit.  Take aspirin only as told by your health care provider. Make sure that you understand how much to take and what form to take. Work with your health care provider to find out whether it is safe and beneficial for you to take aspirin daily.  Find healthy ways to manage stress, such as:  Meditation, yoga, or listening to music.  Journaling.  Talking to a trusted person.  Spending time with friends and family.  Minimize exposure to UV radiation to reduce your risk of skin cancer.  Safety  Always wear your seat belt while driving or riding in a vehicle.  Do not drive:  If you have been drinking alcohol. Do not ride with someone who has been drinking.  When you are tired or distracted.  While texting.  If you have been using any mind-altering substances or drugs.  Wear a helmet and other protective equipment during sports activities.  If you have firearms in your house, make sure you follow all gun safety procedures.  Seek help if you have been physically or sexually abused.  What's next?  Visit your health care provider once a year for an annual wellness visit.  Ask your health care provider how often you should have your eyes and teeth checked.  Stay up to date on all vaccines.  This information is not intended to replace advice given to you by your health care provider. Make sure you discuss any questions you have with your health care provider.  Document Revised: 06/22/2020 Document Reviewed: 06/22/2020  Elsevier Patient Education  2024 [employer].

## 2023-11-15 ENCOUNTER — Ambulatory Visit: Payer: Self-pay | Admitting: Family Medicine

## 2023-11-15 ENCOUNTER — Encounter: Payer: Self-pay | Admitting: Family Medicine

## 2023-11-15 ENCOUNTER — Ambulatory Visit: Admitting: Professional Counselor

## 2023-11-15 VITALS — BP 124/76 | HR 89 | Resp 16 | Ht 65.0 in | Wt 332.6 lb

## 2023-11-15 DIAGNOSIS — Z23 Encounter for immunization: Secondary | ICD-10-CM

## 2023-11-15 DIAGNOSIS — F424 Excoriation (skin-picking) disorder: Secondary | ICD-10-CM

## 2023-11-15 DIAGNOSIS — F411 Generalized anxiety disorder: Secondary | ICD-10-CM | POA: Diagnosis not present

## 2023-11-15 DIAGNOSIS — R058 Other specified cough: Secondary | ICD-10-CM | POA: Diagnosis not present

## 2023-11-15 DIAGNOSIS — Z1231 Encounter for screening mammogram for malignant neoplasm of breast: Secondary | ICD-10-CM

## 2023-11-15 DIAGNOSIS — J4531 Mild persistent asthma with (acute) exacerbation: Secondary | ICD-10-CM | POA: Diagnosis not present

## 2023-11-15 DIAGNOSIS — Z01419 Encounter for gynecological examination (general) (routine) without abnormal findings: Secondary | ICD-10-CM

## 2023-11-15 DIAGNOSIS — F4310 Post-traumatic stress disorder, unspecified: Secondary | ICD-10-CM | POA: Diagnosis not present

## 2023-11-15 DIAGNOSIS — F603 Borderline personality disorder: Secondary | ICD-10-CM | POA: Diagnosis not present

## 2023-11-15 DIAGNOSIS — Z01411 Encounter for gynecological examination (general) (routine) with abnormal findings: Secondary | ICD-10-CM

## 2023-11-15 MED ORDER — AZITHROMYCIN 250 MG PO TABS: ORAL_TABLET | ORAL | 0 refills | Status: AC

## 2023-11-15 NOTE — Progress Notes (Signed)
  THERAPIST PROGRESS NOTE  Virtual Visit via Video Note  I connected with Barbara Pennington on 11/16/23 at 12:00 PM EST by a video enabled telemedicine application and verified that I am speaking with the correct person using two identifiers.  Location: Patient: Home Provider: Remote office   I discussed the limitations of evaluation and management by telemedicine and the availability of in person appointments. The patient expressed understanding and agreed to proceed.   I discussed the assessment and treatment plan with the patient. The patient was provided an opportunity to ask questions and all were answered. The patient agreed with the plan and demonstrated an understanding of the instructions.   The patient was advised to call back or seek an in-person evaluation if the symptoms worsen or if the condition fails to improve as anticipated.  I provided 60 minutes of non-face-to-face time during this encounter. Barbara Pennington, [facility]  Session Time: 12:00 PM - 1:00 PM  Participation Level: Active  Behavioral Response: Casual, Alert, Dysphoric  Type of Therapy: Group Therapy  Interventions: CBT and Supportive  Summary: Barbara Pennington is a 43 y.o. female who presents with a history of anxiety, trauma, BPD, and skin picking disorder. She appeared somber but oriented x5. She stated she has been struggling with making good choices this week. [name] identified her goal as being more positive throughout the next week.   Therapist Response: Conducted telehealth group session. Began session with introductions and review of group rules and expectations. Engaged group members in mindfulness exercise - guided meditation for emotion regulation. Conducted brief check-in with each group member. Reviewed handouts and worksheets - Emotion regulation module - Opposite action and problem solving. Passenger transport manager with each group member and goal setting between now and next session.   Suicidal/Homicidal:  No  Diagnosis: Borderline personality disorder (HCC)  PTSD (post-traumatic stress disorder)  GAD (generalized anxiety disorder)  Skin-picking disorder  Patient/Guardian was advised Release of Information must be obtained prior to any record release in order to collaborate their care with an outside provider. Patient/Guardian was advised if they have not already done so to contact the registration department to sign all necessary forms in order for us  to release information regarding their care.   Consent: Patient/Guardian gives verbal consent for treatment and assignment of benefits for services provided during this visit. Patient/Guardian expressed understanding and agreed to proceed.   Barbara Pennington, [facility] 11/16/2023

## 2023-11-15 NOTE — Addendum Note (Signed)
 Addended by: GLENARD MIRE F on: 11/15/2023 04:36 PM   Modules accepted: Orders

## 2023-11-15 NOTE — Progress Notes (Signed)
 Name: Barbara Pennington   MRN: [id number]    DOB: 06-Sep-1980   Date:11/15/2023       Progress Note  Subjective  Chief Complaint  Chief Complaint  Patient presents with   Annual Exam   Cough    Onset for a week    HPI  Patient presents for annual CPE and discuss productive cough  Discussed the use of AI scribe software for clinical note transcription with the patient, who gave verbal consent to proceed.  History of Present Illness Barbara Pennington is a 43 year old female with asthma who presents with a productive cough.  She has been experiencing a productive cough for the past week, describing her lungs as feeling 'clogged and sticky,' similar to a previous episode of pneumonia in January. The cough began after her partner, Barbara Pennington, came home with a mild cough. Initially, her symptoms included a clear nasal discharge, which has since turned yellowish. She has been using over-the-counter medications such as NyQuil and Mucinex , as well as hot tea, to manage her symptoms. She also tried Tessalon  Perles left over from January, but they have not been effective.  She has a history of asthma and is currently using Symbicort , taking two puffs twice a day. She has not been using her rescue inhaler, Airsupra , as she no longer has it. No wheezing is reported, and her cough is worse in the mornings but improves throughout the day.  Her past medical history includes chronic kidney disease stage 3. She also has a history of prediabetes, for which she takes medication. Recent blood work showed a slightly elevated B12 level and low vitamin D , which she is not currently supplementing. Her cholesterol levels are within acceptable ranges, though her good cholesterol has decreased slightly.  In terms of social history, she mentions challenges with maintaining a healthy diet and regular physical activity. She cooks at home but often eats out due to a busy schedule. She is a member of a gym but struggles to attend  regularly.      Diet: she has been eating out more often lately but making better choices when she eats at home Exercise: discussed importance of regular physical activity   Last Eye Exam: completed Last Dental Exam: encouraged patient to get it done   [employer] Office Visit from 11/15/2023 in [facility]  AUDIT-C Score 0   Depression: Phq 9 is  negative    11/15/2023    3:08 PM 09/04/2023    3:19 PM 09/04/2023    8:55 AM 08/14/2023    7:59 AM 07/10/2023    2:05 PM  Depression screen PHQ 2/9  Decreased Interest 0 0  0 0  Down, Depressed, Hopeless 0 0  0 0  PHQ - 2 Score 0 0  0 0  Altered sleeping  0     Tired, decreased energy  0     Change in appetite  0     Feeling bad or failure about yourself   0     Trouble concentrating  0     Moving slowly or fidgety/restless  0     Suicidal thoughts  0     PHQ-9 Score  0      Difficult doing work/chores  Not difficult at all        Information is confidential and restricted. Go to Review Flowsheets to unlock data.   Data saved with a previous flowsheet row definition   Hypertension: BP Readings  from Last 3 Encounters:  11/15/23 124/76  09/04/23 128/76  08/14/23 117/70   Obesity: Wt Readings from Last 3 Encounters:  11/15/23 (!) 332 lb 9.6 oz (150.9 kg)  09/04/23 (!) 335 lb 4.8 oz (152.1 kg)  08/14/23 (!) 340 lb (154.2 kg)   BMI Readings from Last 3 Encounters:  11/15/23 55.35 kg/m  09/04/23 55.80 kg/m  08/14/23 56.58 kg/m     Vaccines: reviewed with the patient.   Hep C Screening: completed STD testing and prevention (HIV/chl/gon/syphilis): N/A Intimate partner violence: negative screen  Sexual History : no pain Menstrual History/LMP/Abnormal Bleeding: s/p hysterectomy  Discussed importance of follow up if any post-menopausal bleeding: not applicable  Incontinence Symptoms: negative for symptoms   Breast cancer:  - Last Mammogram: she will schedule it  - BRCA gene screening:  N/A  Osteoporosis Prevention : Discussed high calcium  and vitamin D  supplementation, weight bearing exercises Bone density :not applicable   Cervical cancer screening: not applicable due to hysterectomy  Skin cancer: Discussed monitoring for atypical lesions  Colorectal cancer: start next year    Lung cancer:  Low Dose CT Chest recommended if Age 67-80 years, 20 pack-year currently smoking OR have quit w/in 15years. Patient does not qualify for screen   ECG: 01/2023   Advanced Care Planning: A voluntary discussion about advance care planning including the explanation and discussion of advance directives.  Discussed health care proxy and Living will, and the patient was able to identify a health care proxy as Dasie - boyfriend.  Patient does not have a living will and power of attorney of health care   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 06/09/2021   GAD (generalized anxiety disorder) 05/05/2021   Depressive disorder 05/05/2021   History of borderline personality disorder 05/05/2021   History of alcohol abuse 05/05/2021   Internal derangement of left knee 04/03/2021   Arthritis of left knee 04/03/2021   Metabolic syndrome 03/24/2021   Impaired fasting glucose 12/06/2020   Lymphedema 12/06/2020   Moderate episode of recurrent major depressive disorder (HCC) 02/08/2020   CKD (chronic kidney disease) stage 3, GFR 30-59 ml/min (HCC) 06/11/2019   PTSD (post-traumatic stress disorder) 01/14/2019   Borderline personality disorder (HCC) 01/14/2019   Anxiety 01/14/2019   Mixed hyperlipidemia 01/14/2019   Morbid obesity with BMI  of 50.0-59.9, adult (HCC) 01/14/2019   OSA on CPAP 01/14/2019   Asthma, mild persistent 04/30/2018   Low back pain 08/20/2017   Fibroadenoma of breast, 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   Essential hypertension     Past Surgical History:  Procedure Laterality Date   ABDOMINAL HYSTERECTOMY  01/08/2006   BREAST BIOPSY Left 01/09/2011   CORE - FIBROADENOMA VS PHYLLODES   CESAREAN SECTION  01/08/2005   COLONOSCOPY WITH PROPOFOL  N/A 10/09/2016   Procedure: COLONOSCOPY WITH PROPOFOL ;  Surgeon: Therisa Bi, MD;  Location[facility];  Service: Gastroenterology;  Laterality: N/A;   COLONOSCOPY WITH PROPOFOL  N/A 11/09/2019   Procedure: COLONOSCOPY WITH PROPOFOL ;  Surgeon: Unk Corinn Skiff, MD;  Location: [facility] ENDOSCOPY;  Service: Gastroenterology;  Laterality: N/A;   ESOPHAGOGASTRODUODENOSCOPY     GUM SURGERY  01/08/2002   KNEE ARTHROSCOPY WITH MEDIAL MENISECTOMY Right  07/24/2021   Procedure: Right medial meniscus root repair and patella chondroplasty;  Surgeon: Tobie Priest, MD;  Location: [facility] ORS;  Service: Orthopedics;  Laterality: Right;   PLANTAR FASCIA RELEASE Left 08/19/2014   Procedure: ENDOSCOPIC PLANTAR FASCIOTOMY RELEASE L WITH TOPAZ;  Surgeon: Donnice Cory, DPM;  Location: [facility];  Service: Podiatry;  Laterality: Left;  LMA WITH POPLITEAL BLOCK   TONSILLECTOMY  01/08/1982    Family History  Problem Relation Age of Onset   Obesity Mother    Diabetes Mother    High Cholesterol Mother    Hypertension Mother    Neuropathy Mother    Depression Mother    Anxiety disorder Mother    Asthma Mother    Arthritis Mother    Obesity Father    Hypertension Father    High Cholesterol Father    Depression Father    Anxiety disorder Father    Seizures Brother    Testicular cancer Brother    Cancer Brother    Breast cancer Maternal Grandmother    Hypertension Maternal Grandmother    Aneurysm Maternal Grandmother    Stroke Maternal Grandmother    Depression Maternal Grandmother     Varicose Veins Maternal Grandmother    Hypertension Maternal Grandfather    High Cholesterol Maternal Grandfather    Heart disease Maternal Grandfather        has a pacemaker   Stroke Maternal Grandfather    Alzheimer's disease Paternal Grandmother    Dementia Paternal Grandmother    Cancer Paternal Grandmother    Colon cancer Paternal Grandfather    Cancer Paternal Grandfather    Mental illness Maternal Aunt    Suicidality Cousin     Social History   Socioeconomic History   Marital status: Divorced    Spouse name: Not on file   Number of children: 2   Years of education: 14   Highest education level: Bachelor's degree (e.g., BA, AB, BS)  Occupational History   Not on file  Tobacco Use   Smoking status: Former    Current packs/day: 0.00    Average packs/day: 0.3 packs/day for 10.0 years (2.5 ttl pk-yrs)    Types: Cigarettes    Start date: 07/04/2004    Quit date: 07/05/2014    Years since quitting: 9.3   Smokeless tobacco: Never  Vaping Use   Vaping status: Never Used  Substance and Sexual Activity   Alcohol use: Yes    Comment: rare   Drug use: No   Sexual activity: Yes    Partners: Male    Birth control/protection: Surgical    Comment: 2007  Other Topics Concern   Not on file  Social History Narrative   Not on file   Social Drivers of Health   Financial Resource Strain: High Risk (03/04/2023)   Overall Financial Resource Strain (CARDIA)    Difficulty of Paying Living Expenses: Hard  Food Insecurity: Food Insecurity Present (03/04/2023)   Hunger Vital Sign    Worried About Running Out of Food in the Last Year: Often true    Ran Out of Food in the Last Year: Sometimes true  Transportation Needs: No Transportation Needs (03/04/2023)   PRAPARE - Administrator, Civil Service (Medical): No    Lack of Transportation (Non-Medical): No  Physical Activity: Inactive (03/04/2023)   Exercise Vital Sign    Days of Exercise per Week: 0 days    Minutes of  Exercise per Session: 90 min  Stress: Stress Concern Present (  03/04/2023)   [employer] - Occupational Stress Questionnaire    Feeling of Stress : Very much  Social Connections: Moderately Isolated (03/04/2023)   Social Connection and Isolation Panel    Frequency of Communication with Friends and Family: Three times a week    Frequency of Social Gatherings with Friends and Family: Patient declined    Attends Religious Services: Never    Database Administrator or Organizations: No    Attends Engineer, Structural: Not on file    Marital Status: Living with partner  Intimate Partner Violence: Not At Risk (11/08/2022)   Humiliation, Afraid, Rape, and Kick questionnaire    Fear of Current or Ex-Partner: No    Emotionally Abused: No    Physically Abused: No    Sexually Abused: No     Current Outpatient Medications:    acetaminophen  (TYLENOL ) 500 MG tablet, Take 1,000 mg by mouth every 6 (six) hours as needed for moderate pain (pain score 4-6)., Disp: , Rfl:    Albuterol -Budesonide  (AIRSUPRA ) 90-80 MCG/ACT AERO, Inhale 2 puffs into the lungs 4 (four) times daily as needed., Disp: 10.7 g, Rfl: 1   amLODipine  (NORVASC ) 2.5 MG tablet, Take 1 tablet (2.5 mg total) by mouth daily., Disp: 90 tablet, Rfl: 1   atorvastatin  (LIPITOR) 20 MG tablet, Take 1 tablet (20 mg total) by mouth every morning., Disp: 90 tablet, Rfl: 1   budesonide -formoterol  (SYMBICORT ) 160-4.5 MCG/ACT inhaler, Inhale 2 puffs into the lungs in the morning and at bedtime., Disp: 1 each, Rfl: 12   buPROPion  (WELLBUTRIN  XL) 150 MG 24 hr tablet, Take 1 tablet (150 mg total) by mouth daily with breakfast. Stop wellbutrin  xl 300 mg, Disp: 90 tablet, Rfl: 3   busPIRone  (BUSPAR ) 30 MG tablet, TAKE 1 TABLET BY MOUTH 2 TIMES DAILY., Disp: 180 tablet, Rfl: 1   desloratadine  (CLARINEX ) 5 MG tablet, Take 1 tablet (5 mg total) by mouth daily., Disp: 90 tablet, Rfl: 1   escitalopram  (LEXAPRO ) 20 MG tablet,  TAKE 1 TABLET BY MOUTH EVERY DAY, Disp: 90 tablet, Rfl: 1   fluconazole  (DIFLUCAN ) 150 MG tablet, Take 1 tablet (150 mg total) by mouth every other day., Disp: 2 tablet, Rfl: 0   fluticasone  (FLONASE ) 50 MCG/ACT nasal spray, Place 1 spray into both nostrils daily., Disp: 48 mL, Rfl: 1   Melatonin 10 MG CAPS, Take 10-20 mg by mouth at bedtime., Disp: , Rfl:    meloxicam  (MOBIC ) 15 MG tablet, Take 15 mg by mouth daily., Disp: , Rfl:    modafinil  (PROVIGIL ) 100 MG tablet, Take 1 tablet (100 mg total) by mouth daily., Disp: 90 tablet, Rfl: 0   montelukast  (SINGULAIR ) 10 MG tablet, Take 1 tablet (10 mg total) by mouth at bedtime., Disp: 90 tablet, Rfl: 1   mupirocin  ointment (BACTROBAN ) 2 %, Apply 1 Application topically 2 (two) times daily., Disp: 22 g, Rfl: 0   Semaglutide -Weight Management (WEGOVY ) 0.5 MG/0.5ML SOAJ, Inject 0.5 mg into the skin once a week., Disp: 2 mL, Rfl: 0   semaglutide -weight management (WEGOVY ) 1 MG/0.5ML SOAJ SQ injection, Inject 1 mg into the skin once a week., Disp: 2 mL, Rfl: 0   semaglutide -weight management (WEGOVY ) 1.7 MG/0.75ML SOAJ SQ injection, Inject 1.7 mg into the skin once a week., Disp: 3 mL, Rfl: 0   valsartan -hydrochlorothiazide  (DIOVAN -HCT) 160-25 MG tablet, Take 1 tablet by mouth daily., Disp: 90 tablet, Rfl: 0  Allergies  Allergen Reactions   Wound Dressing Adhesive  Latex Rash   Metformin And Related     diarrhea     ROS  Constitutional: Negative for fever .positive for mild  weight change.  Respiratory: positive  for cough and shortness of breath.   Cardiovascular: Negative for chest pain or palpitations.  Gastrointestinal: Negative for abdominal pain, no bowel changes.  Musculoskeletal: Negative for gait problem or joint swelling.  Skin: Negative for rash.  Neurological: Negative for dizziness or headache.  No other specific complaints in a complete review of systems (except as listed in HPI above).   Objective  Vitals:   11/15/23 1509   BP: 124/76  Pulse: 89  Resp: 16  SpO2: 99%  Weight: (!) 332 lb 9.6 oz (150.9 kg)  Height: 5' 5 (1.651 m)    Body mass index is 55.35 kg/m.  Physical Exam  Constitutional: Patient appears well-developed and well-nourished. No distress.  HENT: Head: Normocephalic and atraumatic. Ears: B TMs ok, no erythema or effusion; Nose: Nose normal. Mouth/Throat: Oropharynx is clear and moist. No oropharyngeal exudate.  Eyes: Conjunctivae and EOM are normal. Pupils are equal, round, and reactive to light. No scleral icterus.  Neck: Normal range of motion. Neck supple. No JVD present. No thyromegaly present.  Cardiovascular: Normal rate, regular rhythm and normal heart sounds.  No murmur heard. No BLE edema. Pulmonary/Chest: Effort normal and breath sounds normal. No respiratory distress. Abdominal: Soft. Bowel sounds are normal, no distension. There is no tenderness. no masses Breast: no lumps or masses, no nipple discharge or rashes FEMALE GENITALIA:  Not done  RECTAL: not done  Musculoskeletal: Normal range of motion, no joint effusions. No gross deformities Neurological: he is alert and oriented to person, place, and time. No cranial nerve deficit. Coordination, balance, strength, speech and gait are normal.  Skin: Skin is warm and dry. No rash noted. No erythema.  Psychiatric: Patient has a normal mood and affect. behavior is normal. Judgment and thought content normal.     Assessment & Plan  General Health Maintenance Routine health maintenance discussed. Recent flu vaccination received. Hepatitis B surface antibody reactive. Mammogram due. No cervical cancer screening needed post-hysterectomy. - Ordered mammogram. - Encouraged regular dental check-ups. - Discussed HPV vaccination eligibility with insurance. - Encouraged regular physical activity and healthy diet.  Mild persistent asthma with acute exacerbation and productive cough Acute exacerbation likely due to recent upper  respiratory infection. Productive cough with yellowish sputum suggests possible bacterial infection. - Prescribed Z-Pak (azithromycin ). - Advised follow-up if no improvement over the weekend. - Consider chest x-ray if symptoms persist. - Continue Symbicort  two puffs twice daily. - Ensure Air Supra is available for rescue use.  Chronic kidney disease, stage 3 Stage 3 CKD. Recent labs show normal parathyroid  and calcium  levels. - Advised to avoid NSAIDs. - Encouraged hydration.  Prediabetes Recent labs indicate prediabetes. No medication prescribed. - Encouraged dietary modifications and increased physical activity.  Vitamin D  deficiency Vitamin D  levels remain low despite supplementation. - Recommended vitamin D  2000 units daily.        -USPSTF grade A and B recommendations reviewed with patient; age-appropriate recommendations, preventive care, screening tests, etc discussed and encouraged; healthy living encouraged; see AVS for patient education given to patient -Discussed importance of 150 minutes of physical activity weekly, eat two servings of fish weekly, eat one serving of tree nuts ( cashews, pistachios, pecans, almonds.SABRA) every other day, eat 6 servings of fruit/vegetables daily and drink plenty of water and avoid sweet beverages.   -Reviewed Health Maintenance:  Yes.

## 2023-11-17 ENCOUNTER — Encounter: Payer: Self-pay | Admitting: Family Medicine

## 2023-11-18 ENCOUNTER — Other Ambulatory Visit: Payer: Self-pay | Admitting: Family Medicine

## 2023-11-18 MED ORDER — WEGOVY 2.4 MG/0.75ML ~~LOC~~ SOAJ: 2.4000 mg | SUBCUTANEOUS | 0 refills | Status: DC

## 2023-11-19 ENCOUNTER — Telehealth: Payer: Self-pay | Admitting: Pharmacy Technician

## 2023-11-19 ENCOUNTER — Other Ambulatory Visit (HOSPITAL_COMMUNITY): Payer: Self-pay

## 2023-11-19 NOTE — Telephone Encounter (Signed)
 Pharmacy Patient Advocate Encounter   Received notification from CoverMyMeds that prior authorization for Wegovy  2.4MG /0.75ML auto-injectors is required/requested.   Insurance verification completed.   The patient is insured through [employer] [facility].   Per test claim: PA required; PA started via CoverMyMeds. KEY [id number] . Waiting for clinical questions to populate.

## 2023-11-19 NOTE — Telephone Encounter (Signed)
 I'm sorry [name], I was trying to find evidence that she has lost 5% and kept it off. Unfortunately her plan will not approve her PA without it so I'm submitting a PA for zepbound  instead

## 2023-11-19 NOTE — Telephone Encounter (Signed)
 It want let me close/sign chart it says you have locked?

## 2023-11-20 ENCOUNTER — Other Ambulatory Visit (HOSPITAL_COMMUNITY): Payer: Self-pay

## 2023-11-21 NOTE — Telephone Encounter (Signed)
 Pharmacy Patient Advocate Encounter  Received notification from [employer] that Prior Authorization for Zepbound  2.5MG /0.5ML pen-injectors has been DENIED.  Full denial letter will be uploaded to the media tab. See denial reason below.    PA #/Case ID/Reference #: [id number]  **We will appeal this denial, as she has already tried the preferred (wegovy ) and hasn't been successful and she has OSA with a documented sleep study**  **Please be advised appeals can take up to 30 days for determination**

## 2023-11-22 ENCOUNTER — Other Ambulatory Visit: Payer: Self-pay | Admitting: Family Medicine

## 2023-11-22 ENCOUNTER — Ambulatory Visit: Admitting: Professional Counselor

## 2023-11-22 DIAGNOSIS — F424 Excoriation (skin-picking) disorder: Secondary | ICD-10-CM

## 2023-11-22 DIAGNOSIS — F4310 Post-traumatic stress disorder, unspecified: Secondary | ICD-10-CM | POA: Diagnosis not present

## 2023-11-22 DIAGNOSIS — F411 Generalized anxiety disorder: Secondary | ICD-10-CM | POA: Diagnosis not present

## 2023-11-22 DIAGNOSIS — F603 Borderline personality disorder: Secondary | ICD-10-CM

## 2023-11-22 DIAGNOSIS — J4530 Mild persistent asthma, uncomplicated: Secondary | ICD-10-CM

## 2023-11-22 NOTE — Telephone Encounter (Signed)
 Good morning, after speaking with our Appeals RPH, we understand that since she hasn't lost the 5 lbs required to continue wegovy , we need a statement documented that she failed wegovy  (Dr. Glenard can just addend her last visit notes) without this statement we really don't have a positive outcome for the appeal for zepbound .  I submitted the PA for zepbound  because I knew 100% that wegovy  would get denied. Please advise  Thanks!

## 2023-11-23 ENCOUNTER — Emergency Department
Admission: EM | Admit: 2023-11-23 | Discharge: 2023-11-23 | Disposition: A | Discharge status: Home / Self Care | Attending: Emergency Medicine | Admitting: Emergency Medicine

## 2023-11-23 ENCOUNTER — Emergency Department

## 2023-11-23 ENCOUNTER — Other Ambulatory Visit: Payer: Self-pay

## 2023-11-23 ENCOUNTER — Encounter: Payer: Self-pay | Admitting: Intensive Care

## 2023-11-23 DIAGNOSIS — N183 Chronic kidney disease, stage 3 unspecified: Secondary | ICD-10-CM | POA: Insufficient documentation

## 2023-11-23 DIAGNOSIS — S61451A Open bite of right hand, initial encounter: Secondary | ICD-10-CM

## 2023-11-23 DIAGNOSIS — Z23 Encounter for immunization: Secondary | ICD-10-CM | POA: Insufficient documentation

## 2023-11-23 DIAGNOSIS — W540XXA Bitten by dog, initial encounter: Secondary | ICD-10-CM | POA: Diagnosis not present

## 2023-11-23 DIAGNOSIS — S60311A Abrasion of right thumb, initial encounter: Secondary | ICD-10-CM | POA: Diagnosis not present

## 2023-11-23 DIAGNOSIS — J45909 Unspecified asthma, uncomplicated: Secondary | ICD-10-CM | POA: Diagnosis not present

## 2023-11-23 DIAGNOSIS — S6991XA Unspecified injury of right wrist, hand and finger(s), initial encounter: Secondary | ICD-10-CM | POA: Diagnosis present

## 2023-11-23 DIAGNOSIS — I129 Hypertensive chronic kidney disease with stage 1 through stage 4 chronic kidney disease, or unspecified chronic kidney disease: Secondary | ICD-10-CM | POA: Insufficient documentation

## 2023-11-23 MED ORDER — AMOXICILLIN-POT CLAVULANATE 875-125 MG PO TABS: 1.0000 | ORAL_TABLET | Freq: Two times a day (BID) | ORAL | 0 refills | Status: AC

## 2023-11-23 MED ORDER — TETANUS-DIPHTH-ACELL PERTUSSIS 5-2-15.5 LF-MCG/0.5 IM SUSP
0.5000 mL | Freq: Once | INTRAMUSCULAR | Status: AC
  Administered 2023-11-23: 0.5 mL via INTRAMUSCULAR
  Filled 2023-11-23: qty 0.5

## 2023-11-23 NOTE — ED Notes (Signed)
 Pt discharged to home, AVS reviewed and pt verbalized understanding.  Pt has no questions at this time.

## 2023-11-23 NOTE — Discharge Instructions (Signed)
 Your x-ray is normal.  Please follow-up with your outpatient provider.  Please return for any new, worsening, or changing symptoms or other concerns.  It was a pleasure caring for you today.

## 2023-11-23 NOTE — Progress Notes (Signed)
  THERAPIST PROGRESS NOTE  Virtual Visit via Video Note  I connected with Laural L Minniefield on 11/23/23 at 12:00 PM EST by a video enabled telemedicine application and verified that I am speaking with the correct person using two identifiers.  Location: Patient: Home Provider: Remote office   I discussed the limitations of evaluation and management by telemedicine and the availability of in person appointments. The patient expressed understanding and agreed to proceed.   I discussed the assessment and treatment plan with the patient. The patient was provided an opportunity to ask questions and all were answered. The patient agreed with the plan and demonstrated an understanding of the instructions.   The patient was advised to call back or seek an in-person evaluation if the symptoms worsen or if the condition fails to improve as anticipated.  I provided 60 minutes of non-face-to-face time during this encounter. [name], [facility]  Session Time: 12:00 PM - 1:00 PM  Participation Level: Active  Behavioral Response: Casual, Alert, Dysphoric  Type of Therapy: Group Therapy  Interventions: CBT and Supportive  Summary: Ivyrose L Casciano is a 43 y.o. female who presents with a history of anxiety, trauma, BPD, and skin picking disorder. She appeared somber but oriented x5. She stated she was able to work on her goal, being more positive throughout the week. Adia reported she is still trying to dissect decisions and processing guilt vs shame. She identified her part in communication issues with her partner and notices her fear of being judged. Ceanna reported this will be her goal for the next week.  Therapist Response: Conducted telehealth group session. Began session with introductions and review of group rules and expectations. Engaged group members in mindfulness exercise - guided meditation for problem solving. Conducted brief check-in with each group member. Reviewed handouts and  worksheets - Emotion regulation module - ABC PLEASE skills. Passenger transport manager with each group member and goal setting between now and next session.   Suicidal/Homicidal: No  Diagnosis: Borderline personality disorder (HCC)  PTSD (post-traumatic stress disorder)  GAD (generalized anxiety disorder)  Skin-picking disorder   Patient/Guardian was advised Release of Information must be obtained prior to any record release in order to collaborate their care with an outside provider. Patient/Guardian was advised if they have not already done so to contact the registration department to sign all necessary forms in order for us  to release information regarding their care.   Consent: Patient/Guardian gives verbal consent for treatment and assignment of benefits for services provided during this visit. Patient/Guardian expressed understanding and agreed to proceed.   [name], [facility] 11/23/2023

## 2023-11-23 NOTE — ED Triage Notes (Signed)
 Patient reports she took a dog out for the day from the [place] [facility] and got bit in right hand today.

## 2023-11-23 NOTE — ED Provider Notes (Signed)
 [facility] Provider Note    Event Date/Time   First MD Initiated Contact with Patient 11/23/23 1336     (approximate)   History   Animal Bite   HPI  Barbara Pennington is a 43 y.o. female who presents today for evaluation of dog bite to her right hand.  Patient reports that she took a dog out from the shelter today and bit her hand.  She is unsure of her last tetanus shot.  The dog is up-to-date on its rabies vaccinations per the paperwork that she has with her.  No numbness or tingling.  No weakness.  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 06/09/2021   GAD (generalized anxiety disorder) 05/05/2021   Depressive disorder 05/05/2021   History of borderline personality disorder 05/05/2021   History of alcohol abuse 05/05/2021   Internal derangement of left knee 04/03/2021   Arthritis of left knee 04/03/2021   Metabolic syndrome 03/24/2021   Impaired fasting glucose 12/06/2020   Lymphedema 12/06/2020   Moderate episode of recurrent major depressive disorder (HCC) 02/08/2020   CKD (chronic kidney disease) stage 3, GFR 30-59 ml/min (HCC) 06/11/2019   PTSD (post-traumatic stress disorder) 01/14/2019   Borderline personality disorder (HCC) 01/14/2019   Anxiety 01/14/2019   Mixed hyperlipidemia 01/14/2019   Morbid obesity with BMI of 50.0-59.9, adult (HCC) 01/14/2019   OSA on CPAP 01/14/2019   Asthma, mild persistent 04/30/2018   Low back pain 08/20/2017   Fibroadenoma of breast, 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   Essential hypertension           Physical Exam   Triage Vital Signs: ED Triage Vitals   Encounter Vitals Group     BP 11/23/23 1306 109/88     Girls Systolic BP Percentile --      Girls Diastolic BP Percentile --      Boys Systolic BP Percentile --      Boys Diastolic BP Percentile --      Pulse Rate 11/23/23 1306 85     Resp 11/23/23 1306 18     Temp 11/23/23 1306 98.9 F (37.2 C)     Temp Source 11/23/23 1306 Oral     SpO2 11/23/23 1306 96 %     Weight 11/23/23 1304 (!) 328 lb (148.8 kg)     Height 11/23/23 1304 5' 5 (1.651 m)     Head Circumference --      Peak Flow --      Pain Score 11/23/23 1303 1     Pain Loc --      Pain Education --      Exclude from Growth Chart --     Most recent vital signs: Vitals:   11/23/23 1306  BP: 109/88  Pulse: 85  Resp: 18  Temp: 98.9 F (37.2 C)  SpO2: 96%    Physical Exam Vitals and nursing note reviewed.  Constitutional:      General: Awake and alert. No acute distress.    Appearance: Normal appearance. The patient is normal weight.  HENT:     Head: Normocephalic and atraumatic.     Mouth: Mucous membranes are moist.  Eyes:  General: PERRL. Normal EOMs        Right eye: No discharge.        Left eye: No discharge.     Conjunctiva/sclera: Conjunctivae normal.  Cardiovascular:     Rate and Rhythm: Normal rate.     Pulses: Normal pulses.  Pulmonary:     Effort: Pulmonary effort is normal. No respiratory distress.     Breath sounds: Normal breath sounds.  Abdominal:     Abdomen is soft. There is no abdominal tenderness. No rebound or guarding. No distention. Musculoskeletal:        General: No swelling. Normal range of motion.     Cervical back: Normal range of motion and neck supple.  Right hand: There is a superficial abrasion at the base of the thumb.  Full normal range of motion with abduction and adduction of thumb against resistance.  Able to flex and extend at isolated MCP, DIP, and PIP against resistance. Skin:    General: Skin is warm and dry.     Capillary Refill: Capillary refill takes less  than 2 seconds.     Findings: No rash.  Neurological:     Mental Status: The patient is awake and alert.      ED Results / Procedures / Treatments   Labs (all labs ordered are listed, but only abnormal results are displayed) Labs Reviewed - No data to display   EKG     RADIOLOGY I independently reviewed and interpreted imaging and agree with radiologists findings.     PROCEDURES:  Critical Care performed:   Procedures   MEDICATIONS ORDERED IN ED: Medications  Tdap (ADACEL) injection 0.5 mL (0.5 mLs Intramuscular Given 11/23/23 1354)     IMPRESSION / MDM / ASSESSMENT AND PLAN / ED COURSE  I reviewed the triage vital signs and the nursing notes.   Differential diagnosis includes, but is not limited to, dog bite, abrasion, fracture.  Patient is awake and alert, hemodynamically stable and afebrile.  She has a very superficial wound at the base of her thumb, though full and normal range of motion of her thumb against resistance.  Do not suspect tendon injury.  No bony tenderness, and x-ray is negative for any acute bony injuries or retained foreign bodies.  She was given an updated tetanus shot.  No need for rabies series given that the dog is up-to-date.  RN called animal control.  We discussed return precautions and outpatient follow-up.  Patient understands and agrees with plan.  Discharged in stable condition.   Patient's presentation is most consistent with acute complicated illness / injury requiring diagnostic workup.      FINAL CLINICAL IMPRESSION(S) / ED DIAGNOSES   Final diagnoses:  Dog bite of right hand, initial encounter     Rx / DC Orders   ED Discharge Orders          Ordered    amoxicillin -clavulanate (AUGMENTIN ) 875-125 MG tablet  2 times daily        11/23/23 1403             Note:  This document was prepared using Dragon voice recognition software and may include unintentional dictation errors.   Aivah Putman E, PA-C 11/23/23  1425    Ernest Ronal BRAVO, MD 11/24/23 [phone]

## 2023-11-23 NOTE — Telephone Encounter (Signed)
 Requested Prescriptions  Pending Prescriptions Disp Refills   montelukast  (SINGULAIR ) 10 MG tablet [Pharmacy Med Name: MONTELUKAST  SOD 10 MG TABLET] 90 tablet 0    Sig: TAKE 1 TABLET BY MOUTH EVERYDAY AT BEDTIME     Pulmonology:  Leukotriene Inhibitors Passed - 11/23/2023 11:52 AM      Passed - Valid encounter within last 12 months    Recent Outpatient Visits           1 week ago Well woman exam   [facility] [place] Mire, MD   2 months ago Moderate episode of recurrent major depressive disorder [facility])   [facility] [place], Mire, MD   5 months ago Morbid obesity with BMI of 50.0-59.9, adult [facility])   [facility] [place] Mire, MD   6 months ago Pyogenic granuloma   [facility] [place], Mire, MD   8 months ago Mild persistent asthma without complication   [facility] [place] Mire, MD       Future Appointments             In 2 weeks Sowles, Krichna, MD [facility], [place]

## 2023-11-23 NOTE — ED Notes (Signed)
 [place] PD notified

## 2023-11-25 NOTE — Telephone Encounter (Signed)
 Thank you :)

## 2023-11-27 ENCOUNTER — Telehealth: Payer: Self-pay | Admitting: Pharmacist

## 2023-11-27 ENCOUNTER — Other Ambulatory Visit: Payer: Self-pay | Admitting: Family Medicine

## 2023-11-27 ENCOUNTER — Ambulatory Visit: Admitting: Professional Counselor

## 2023-11-27 DIAGNOSIS — F424 Excoriation (skin-picking) disorder: Secondary | ICD-10-CM | POA: Diagnosis not present

## 2023-11-27 DIAGNOSIS — F411 Generalized anxiety disorder: Secondary | ICD-10-CM

## 2023-11-27 DIAGNOSIS — F4310 Post-traumatic stress disorder, unspecified: Secondary | ICD-10-CM

## 2023-11-27 DIAGNOSIS — F603 Borderline personality disorder: Secondary | ICD-10-CM | POA: Diagnosis not present

## 2023-11-27 DIAGNOSIS — G4733 Obstructive sleep apnea (adult) (pediatric): Secondary | ICD-10-CM

## 2023-11-27 MED ORDER — TIRZEPATIDE 2.5 MG/0.5ML ~~LOC~~ SOAJ: 2.5000 mg | SUBCUTANEOUS | 0 refills | Status: DC

## 2023-11-27 NOTE — Telephone Encounter (Signed)
 The insurance has approved Mounjaro (tirzepatide ) for the patient through 07/25/2024. [employer] is currently the only payer allowing off-label use of Mounjaro for weight management. Please note that this approval applies only to Mounjaro and does not extend to Zepbound .  A new prescription for Mounjaro will need to be sent to the pharmacy in order for the medication to process under the approved authorization.  Full approval letter can be found  under the media tab.  Thank you, Devere Pandy, PharmD Clinical Pharmacist  [place]  Direct Dial: [phone]

## 2023-11-27 NOTE — Progress Notes (Signed)
 THERAPIST PROGRESS NOTE  Virtual Visit via Video Note  I connected with Barbara Pennington on 11/27/23 at  1:00 PM EST by a video enabled telemedicine application and verified that I am speaking with the correct person using two identifiers.  Location: Patient: Home Provider: Remote office   I discussed the limitations of evaluation and management by telemedicine and the availability of in person appointments. The patient expressed understanding and agreed to proceed.   I discussed the assessment and treatment plan with the patient. The patient was provided an opportunity to ask questions and all were answered. The patient agreed with the plan and demonstrated an understanding of the instructions.   The patient was advised to call back or seek an in-person evaluation if the symptoms worsen or if the condition fails to improve as anticipated.  I provided 29 minutes of non-face-to-face time during this encounter. [name], [facility]  Session Time: 1:00 PM - 1:29 PM   Participation Level: Active  Behavioral Response: Casual, Alert, Dysphoric  Type of Therapy: Individual Therapy  Treatment Goals addressed: Active Anxiety  LTG: I guess working through all the issues from the past. Learning how to better cope with stress. (Progressing)                Start:  02/05/23    Expected End:  02/04/24    Goal Note Reviewed 10/16/23 - I think I'm dealing better with stress and I need to improve ways of dealing with stress on a day-to-day basis. How do you know when you're worked through?   STG: I don't deal with stress well. I get overwhelmed. I shut down. I get angry. I can't communicate my thoughts. To improve knowledge of DBT skills to reduce sxs per self report over the next 12 weeks.  (Progressing)  Goal Note Reviewed 10/16/23 - Making little headway. I think I'm always critical of myself. I feel I am communicating better. I'm able to put my thoughts into words in a more calm manner  and I think I need more practice.   STG: Holding my tongue. Dealing with my anger and dealing with emotions and where they come from. To improve emotion regulation AEB self-report increase in emotional intelligence and use of coping mechanisms to regulate emotions over the next 12 weeks.   ProgressTowards Goals: Progressing  Interventions: CBT, Assertiveness Training, and Supportive  Summary: Barbara Pennington is a 43 y.o. female who presents with a history of anxiety, trauma, BPD, and skin picking disorder. She appeared somber but oriented x5. She reported ongoing communication issues and conflict with her partner. Barbara Pennington has been focusing on making better choices, which includes going to the gym. She was receptive to psychoeducation on relationships. However, she struggles to be more assertive in order to avoid conflict. She had to end session early to pick up her boyfriend from work. She noted she will try to prioritize her needs in the future though.  Therapist Response: Conducted session with Barbara Pennington. Began session with check-in/update since previous session. Utilized empathetic and reflective listening. Used open-ended questions to facilitate discussion and summarized Barbara Pennington's thoughts/feelings. Provided psychoeducation on healthy relationships and communication. Encouraged Barbara Pennington to prioritize her needs and use assertive communication. Scheduled additional appointment and concluded session. Emailed handouts to [name].  Suicidal/Homicidal: No  Plan: Return again in 2 weeks.  Diagnosis: Borderline personality disorder (HCC)  PTSD (post-traumatic stress disorder)  GAD (generalized anxiety disorder)  Skin-picking disorder  Collaboration of Care: Medication Management AEB chart review  Patient/Guardian was advised Release of Information must be obtained prior to any record release in order to collaborate their care with an outside provider. Patient/Guardian was advised if they have not already done so to  contact the registration department to sign all necessary forms in order for us  to release information regarding their care.   Consent: Patient/Guardian gives verbal consent for treatment and assignment of benefits for services provided during this visit. Patient/Guardian expressed understanding and agreed to proceed.   [name], [facility] 11/27/2023

## 2023-11-29 ENCOUNTER — Ambulatory Visit: Admitting: Professional Counselor

## 2023-11-29 DIAGNOSIS — F603 Borderline personality disorder: Secondary | ICD-10-CM | POA: Diagnosis not present

## 2023-11-29 NOTE — Progress Notes (Unsigned)
  THERAPIST PROGRESS NOTE  Virtual Visit via Video Note  I connected with Barbara Pennington on 12/02/23 at 12:00 PM EST by a video enabled telemedicine application and verified that I am speaking with the correct person using two identifiers.  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 The patient expressed understanding and agreed to proceed.  I discussed the assessment and treatment plan with the patient. The patient was provided an opportunity to ask questions and all were answered. The patient agreed with the plan and demonstrated an understanding of the instructions.   The patient was advised to call back or seek an in-person evaluation if the symptoms worsen or if the condition fails to improve as anticipated.  I provided 60 minutes of non-face-to-face time during this encounter. Barbara Pennington, [facility]  Session Time: 12:00 PM - 1:00 PM  Participation Level: Active  Behavioral Response: Well Groomed, Alert, Dysphoric  Type of Therapy: Group Therapy  Interventions: CBT and Supportive  Summary: Barbara Pennington is a 43 y.o. female who presents with a history of anxiety, trauma, skin picking disorder, and BPD. She appeared dysphoric but oriented x5. She was tearful during her check-in. She expressed feeling like she shouldn't be here and noted she had a really bad week. She reported she had a huge argument with her boyfriend and she hasn't been feeling very positive.   Therapist Response: Conducted telehealth group session. Began session with introductions and review of group rules and expectations. Engaged group members in mindfulness exercise - guided meditation for self-care. Conducted brief check-in with each group member. Reviewed handouts and worksheets - Emotion regulation module - Mindfulness of current emotions, managing extreme  emotions, and troubleshoot/review. Passenger transport manager and assigned homework to practice emotion regulation skills using worksheets at end of module.  Suicidal/Homicidal: No  Diagnosis: Borderline personality disorder (HCC)  Patient/Guardian was advised Release of Information must be obtained prior to any record release in order to collaborate their care with an outside provider. Patient/Guardian was advised if they have not already done so to contact the registration department to sign all necessary forms in order for us  to release information regarding their care.   Consent: Patient/Guardian gives verbal consent for treatment and assignment of benefits for services provided during this visit. Patient/Guardian expressed understanding and agreed to proceed.   Barbara Pennington, [facility] 11/29/2023

## 2023-12-08 ENCOUNTER — Encounter: Payer: Self-pay | Admitting: Family Medicine

## 2023-12-11 ENCOUNTER — Ambulatory Visit: Admitting: Family Medicine

## 2023-12-11 ENCOUNTER — Ambulatory Visit: Admitting: Professional Counselor

## 2023-12-11 VITALS — BP 123/69 | HR 73 | Temp 97.9°F | Resp 16 | Ht 65.0 in | Wt 323.1 lb

## 2023-12-11 DIAGNOSIS — F411 Generalized anxiety disorder: Secondary | ICD-10-CM

## 2023-12-11 DIAGNOSIS — Z6841 Body Mass Index (BMI) 40.0 and over, adult: Secondary | ICD-10-CM

## 2023-12-11 DIAGNOSIS — E538 Deficiency of other specified B group vitamins: Secondary | ICD-10-CM | POA: Insufficient documentation

## 2023-12-11 DIAGNOSIS — F4310 Post-traumatic stress disorder, unspecified: Secondary | ICD-10-CM

## 2023-12-11 DIAGNOSIS — N1831 Chronic kidney disease, stage 3a: Secondary | ICD-10-CM

## 2023-12-11 DIAGNOSIS — F603 Borderline personality disorder: Secondary | ICD-10-CM

## 2023-12-11 DIAGNOSIS — I10 Essential (primary) hypertension: Secondary | ICD-10-CM

## 2023-12-11 DIAGNOSIS — J4530 Mild persistent asthma, uncomplicated: Secondary | ICD-10-CM

## 2023-12-11 DIAGNOSIS — R309 Painful micturition, unspecified: Secondary | ICD-10-CM

## 2023-12-11 DIAGNOSIS — Z113 Encounter for screening for infections with a predominantly sexual mode of transmission: Secondary | ICD-10-CM

## 2023-12-11 DIAGNOSIS — N898 Other specified noninflammatory disorders of vagina: Secondary | ICD-10-CM | POA: Diagnosis not present

## 2023-12-11 DIAGNOSIS — G4733 Obstructive sleep apnea (adult) (pediatric): Secondary | ICD-10-CM | POA: Diagnosis not present

## 2023-12-11 LAB — BASIC METABOLIC PANEL WITH GFR
BUN/Creatinine Ratio: 9 (calc) (ref 6–22)
BUN: 12 mg/dL (ref 7–25)
CO2: 29 mmol/L (ref 20–32)
Calcium: 9.5 mg/dL (ref 8.6–10.2)
Chloride: 106 mmol/L (ref 98–110)
Creat: 1.29 mg/dL — ABNORMAL HIGH (ref 0.50–0.99)
Glucose, Bld: 82 mg/dL (ref 65–99)
Potassium: 4 mmol/L (ref 3.5–5.3)
Sodium: 142 mmol/L (ref 135–146)
eGFR: 52 mL/min/1.73m2 — ABNORMAL LOW (ref >=60)

## 2023-12-11 LAB — POCT URINALYSIS DIPSTICK
Bilirubin, UA: NEGATIVE
Blood, UA: NEGATIVE
Glucose, UA: NEGATIVE
Ketones, UA: NEGATIVE
Nitrite, UA: NEGATIVE
Odor: NORMAL
Protein, UA: NEGATIVE
Spec Grav, UA: 1.015 (ref 1.010–1.025)
Urobilinogen, UA: 0.2 U/dL
pH, UA: 6.5 (ref 5.0–8.0)

## 2023-12-11 MED ORDER — CEFTRIAXONE SODIUM 500 MG IJ SOLR
500.0000 mg | Freq: Once | INTRAMUSCULAR | Status: AC
  Administered 2023-12-11: 500 mg via INTRAMUSCULAR

## 2023-12-11 MED ORDER — FLUCONAZOLE 150 MG PO TABS: 150.0000 mg | ORAL_TABLET | Freq: Once | ORAL | 0 refills | Status: AC

## 2023-12-11 MED ORDER — TIRZEPATIDE 5 MG/0.5ML ~~LOC~~ SOAJ: 5.0000 mg | SUBCUTANEOUS | 0 refills | Status: DC

## 2023-12-11 MED ORDER — LIDOCAINE HCL (PF) 1 % IJ SOLN
2.0000 mL | Freq: Once | INTRAMUSCULAR | Status: AC
  Administered 2023-12-11: 2 mL

## 2023-12-11 NOTE — Progress Notes (Signed)
 THERAPIST PROGRESS NOTE  Virtual Visit via Video Note  I connected with Barbara Pennington on 12/11/23 at  1:00 PM EST by a video enabled telemedicine application and verified that I am speaking with the correct person using two identifiers.  Location: Patient: Home Provider: Office   I discussed the limitations of evaluation and management by telemedicine and the availability of in person appointments. The patient expressed understanding and agreed to proceed.   I discussed the assessment and treatment plan with the patient. The patient was provided an opportunity to ask questions and all were answered. The patient agreed with the plan and demonstrated an understanding of the instructions.   The patient was advised to call back or seek an in-person evaluation if the symptoms worsen or if the condition fails to improve as anticipated.  I provided 56 minutes of non-face-to-face time during this encounter. Barbara Pennington, [facility]  Session Time: 1:01 PM - 1:57 PM  Participation Level: Active  Behavioral Response: Casual, Alert, Dysphoric  Type of Therapy: Individual Therapy  Treatment Goals addressed: Active Anxiety  LTG: I guess working through all the issues from the past. Learning how to better cope with stress. (Progressing)                Start:  02/05/23    Expected End:  02/04/24    Goal Note Reviewed 10/16/23 - I think I'm dealing better with stress and I need to improve ways of dealing with stress on a day-to-day basis. How do you know when you're worked through?   STG: I don't deal with stress well. I get overwhelmed. I shut down. I get angry. I can't communicate my thoughts. To improve knowledge of DBT skills to reduce sxs per self report over the next 12 weeks.  (Progressing)  Goal Note Reviewed 10/16/23 - Making little headway. I think I'm always critical of myself. I feel I am communicating better. I'm able to put my thoughts into words in a more calm manner and I  think I need more practice.   STG: Holding my tongue. Dealing with my anger and dealing with emotions and where they come from. To improve emotion regulation AEB self-report increase in emotional intelligence and use of coping mechanisms to regulate emotions over the next 12 weeks.   ProgressTowards Goals: Progressing  Interventions: CBT, Motivational Interviewing, Conservator, Museum/gallery, and Supportive  Summary: Barbara Pennington is a 43 y.o. female who presents with a history of anxiety, trauma, BPD, and skin picking disorder. She appeared somber but oriented x5. She expressed feelings of being stuck, unable to make changes in her life. She enjoys therapy and often feels better after sessions but has been unable to practice skills efficiently. Barbara Pennington was able to note improvements with work and ways she is trying to improve in her personal life. She was receptive to other contributing factors like other people's lack of skills. She explored potential ways to address her concerns with her partner.    Therapist Response: Conducted session with Barbara Pennington. Began session with check-in/update since previous session. Utilized empathetic and reflective listening. Used open-ended questions to facilitate discussion and summarized Barbara Pennington's thoughts/feelings. Normalized struggle with healing journey. Noted other contributing factors that interfere with interpersonal effectiveness. Encouraged Barbara Pennington to approach topics from a place of curiosity rather than judgment. Scheduled additional appointment and concluded session.   Suicidal/Homicidal: No  Plan: Return again in 2 weeks.  Diagnosis: GAD (generalized anxiety disorder)  Borderline personality disorder (HCC)  PTSD (post-traumatic stress  disorder)  Collaboration of Care: Medication Management AEB chart review  Patient/Guardian was advised Release of Information must be obtained prior to any record release in order to collaborate their care with an outside provider.  Patient/Guardian was advised if they have not already done so to contact the registration department to sign all necessary forms in order for us  to release information regarding their care.   Consent: Patient/Guardian gives verbal consent for treatment and assignment of benefits for services provided during this visit. Patient/Guardian expressed understanding and agreed to proceed.   Barbara Pennington, [facility] 12/11/2023

## 2023-12-11 NOTE — Progress Notes (Signed)
 Name: Barbara Pennington   MRN: [id number]    DOB: Sep 08, 1980   Date:12/11/2023       Progress Note  Subjective  Chief Complaint  Chief Complaint  Patient presents with   Medical Management of Chronic Issues   Medication Refill   Obesity    Increase mounjaro  to next dose   Vaginal Discharge    Itching and burning w/ urination   Discussed the use of AI scribe software for clinical note transcription with the patient, who gave verbal consent to proceed.  History of Present Illness Barbara Pennington is a 43 year old female who presents for follow-up on weight management and medication adjustment.  She has a long history of obesity, with her weight peaking at 356 pounds in January 2004. She has attempted various weight loss strategies, including Atkins, Weight Watchers, and Wegovy . Wegovy  initially reduced her weight to 325 pounds, but due to shortages and lack of response at higher doses, her weight increased to 336 pounds by August. Currently, she is on Mounjaro  2.5 mg, which has helped her weight decrease to 323 pounds, although she had previously reached 315 pounds before Thanksgiving. She is incorporating more fresh and frozen vegetables, reducing sodium and sweets, and has started going to the gym.  She has obstructive sleep apnea and uses a CPAP machine nightly. Despite this, she feels tired for years and notes that modafinil  does not help with her energy levels. She has a history of major depression, exacerbated by personal stressors such as her daughter's estrangement. She is currently taking Wellbutrin , buspirone  as needed, acetylcholine 20 mg, and modafinil .  She has a history of chronic kidney disease with fluctuating GFR levels, currently at 48. She was taking meloxicam  for knee pain but is now using Tylenol . She is on valsartan  and amlodipine  for hypertension, with no reported side effects.  She has mild persistent asthma, managed with Symbicort  as needed and montelukast . No current  wheezing or cough. She also has a history of pneumonia and allergies, for which she takes loratadine and Flonase .  She has a B12 deficiency, managed with sublingual supplements, and her levels are currently high. Her folic acid levels have normalized. She takes vitamin D  daily for a previously low level. Her lipid panel shows low HDL and she is on atorvastatin  20 mg.  She reports a recent onset of itching and picking on her hands, which she attributes to anxiety. She has tried over-the-counter treatments without success. She also experiences dysuria and some very low pelvic pain, but her urine test shows no nitrates or blood. She is concerned about a sexually transmitted infection due to recent unprotected sexual activity outside her primary relationship.    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 06/09/2021   GAD (generalized anxiety disorder) 05/05/2021   Depressive disorder 05/05/2021   History of borderline personality disorder 05/05/2021   History of alcohol abuse 05/05/2021   Internal derangement of left knee 04/03/2021   Arthritis of left knee 04/03/2021   Metabolic syndrome 03/24/2021   Impaired fasting glucose 12/06/2020   Lymphedema 12/06/2020   Moderate episode of recurrent major depressive disorder (HCC) 02/08/2020   CKD (  chronic kidney disease) stage 3, GFR 30-59 ml/min (HCC) 06/11/2019   PTSD (post-traumatic stress disorder) 01/14/2019   Borderline personality disorder (HCC) 01/14/2019   Anxiety 01/14/2019   Mixed hyperlipidemia 01/14/2019   Morbid obesity with BMI of 50.0-59.9, adult (HCC) 01/14/2019   OSA on  CPAP 01/14/2019   Asthma, mild persistent 04/30/2018   Low back pain 08/20/2017   Fibroadenoma of breast, 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   Essential hypertension     Past Surgical History:  Procedure Laterality Date   ABDOMINAL HYSTERECTOMY  01/08/2006   BREAST BIOPSY Left 01/09/2011   CORE - FIBROADENOMA VS PHYLLODES   CESAREAN SECTION  01/08/2005   COLONOSCOPY WITH PROPOFOL  N/A 10/09/2016   Procedure: COLONOSCOPY WITH PROPOFOL ;  Surgeon: Therisa Bi, MD;  Location: [facility] ENDOSCOPY;  Service: Gastroenterology;  Laterality: N/A;   COLONOSCOPY WITH PROPOFOL  N/A 11/09/2019   Procedure: COLONOSCOPY WITH PROPOFOL ;  Surgeon: Unk Corinn Skiff, MD;  Location: [facility] ENDOSCOPY;  Service: Gastroenterology;  Laterality: N/A;   ESOPHAGOGASTRODUODENOSCOPY     GUM SURGERY  01/08/2002   KNEE ARTHROSCOPY WITH MEDIAL MENISECTOMY Right 07/24/2021   Procedure: Right medial meniscus root repair and patella chondroplasty;  Surgeon: Tobie Priest, MD;  Location: [facility] ORS;  Service: Orthopedics;  Laterality: Right;   PLANTAR FASCIA RELEASE Left 08/19/2014   Procedure: ENDOSCOPIC PLANTAR FASCIOTOMY RELEASE L WITH TOPAZ;  Surgeon: Donnice Cory, DPM;  Location: [facility];  Service: Podiatry;  Laterality: Left;  LMA WITH POPLITEAL BLOCK   TONSILLECTOMY  01/08/1982    Family History  Problem Relation Age of Onset   Obesity Mother    Diabetes Mother    High Cholesterol Mother    Hypertension Mother    Neuropathy Mother    Depression Mother    Anxiety disorder Mother    Asthma Mother    Arthritis Mother    Obesity Father    Hypertension Father    High Cholesterol Father    Depression Father    Anxiety disorder Father    Seizures Brother    Testicular cancer Brother    Cancer Brother    Breast cancer Maternal Grandmother    Hypertension Maternal Grandmother    Aneurysm Maternal Grandmother    Stroke Maternal Grandmother    Depression Maternal Grandmother    Varicose Veins Maternal Grandmother     Hypertension Maternal Grandfather    High Cholesterol Maternal Grandfather    Heart disease Maternal Grandfather        has a pacemaker   Stroke Maternal Grandfather    Alzheimer's disease Paternal Grandmother    Dementia Paternal Grandmother    Cancer Paternal Grandmother    Colon cancer Paternal Grandfather    Cancer Paternal Grandfather    Mental illness Maternal Aunt    Suicidality Cousin     Social History   Tobacco Use   Smoking status: Former    Current packs/day: 0.00    Average packs/day: 0.3 packs/day for 10.0 years (2.5 ttl pk-yrs)    Types: Cigarettes    Start date: 07/04/2004    Quit date: 07/05/2014    Years since quitting: 9.4   Smokeless tobacco: Never  Substance Use Topics   Alcohol use: Not Currently    Comment: rare     Current Outpatient Medications:    acetaminophen  (TYLENOL ) 500 MG tablet, Take 1,000 mg by mouth every 6 (six) hours as needed for moderate pain (pain score 4-6)., Disp: , Rfl:    amLODipine  (NORVASC ) 2.5 MG tablet, Take 1 tablet (2.5 mg  total) by mouth daily., Disp: 90 tablet, Rfl: 1   atorvastatin  (LIPITOR) 20 MG tablet, Take 1 tablet (20 mg total) by mouth every morning., Disp: 90 tablet, Rfl: 1   budesonide -formoterol  (SYMBICORT ) 160-4.5 MCG/ACT inhaler, Inhale 2 puffs into the lungs in the morning and at bedtime., Disp: 1 each, Rfl: 12   buPROPion  (WELLBUTRIN  XL) 150 MG 24 hr tablet, Take 1 tablet (150 mg total) by mouth daily with breakfast. Stop wellbutrin  xl 300 mg, Disp: 90 tablet, Rfl: 3   busPIRone  (BUSPAR ) 30 MG tablet, TAKE 1 TABLET BY MOUTH 2 TIMES DAILY., Disp: 180 tablet, Rfl: 1   desloratadine  (CLARINEX ) 5 MG tablet, Take 1 tablet (5 mg total) by mouth daily., Disp: 90 tablet, Rfl: 1   escitalopram  (LEXAPRO ) 20 MG tablet, TAKE 1 TABLET BY MOUTH EVERY DAY, Disp: 90 tablet, Rfl: 1   fluticasone  (FLONASE ) 50 MCG/ACT nasal spray, Place 1 spray into both nostrils daily., Disp: 48 mL, Rfl: 1   Melatonin 10 MG CAPS, Take 10-20 mg by  mouth at bedtime., Disp: , Rfl:    modafinil  (PROVIGIL ) 100 MG tablet, Take 1 tablet (100 mg total) by mouth daily., Disp: 90 tablet, Rfl: 0   montelukast  (SINGULAIR ) 10 MG tablet, TAKE 1 TABLET BY MOUTH EVERYDAY AT BEDTIME, Disp: 90 tablet, Rfl: 0   valsartan -hydrochlorothiazide  (DIOVAN -HCT) 160-25 MG tablet, Take 1 tablet by mouth daily., Disp: 90 tablet, Rfl: 0  Allergies  Allergen Reactions   Wound Dressing Adhesive    Latex Rash   Metformin And Related     diarrhea    I personally reviewed active problem list, medication list, allergies, family history with the patient/caregiver today.   ROS  Ten systems reviewed and is negative except as mentioned in HPI    Objective Physical Exam MEASUREMENTS: Weight- 323. CONSTITUTIONAL: Patient appears well-developed and well-nourished.  No distress. HEENT: Head atraumatic, normocephalic, neck supple. CARDIOVASCULAR: Normal rate, regular rhythm and normal heart sounds.  No murmur heard. No BLE edema. PULMONARY: Effort normal and breath sounds normal. No respiratory distress. ABDOMINAL: There is no tenderness or distention. MUSCULOSKELETAL: Normal gait. Without gross motor or sensory deficit. PSYCHIATRIC: Patient has a normal mood and affect. behavior is normal. Judgment and thought content normal.  Vitals:   12/11/23 1420  BP: 123/69  Pulse: 73  Resp: 16  Temp: 97.9 F (36.6 C)  SpO2: 96%  Weight: (!) 323 lb 1.6 oz (146.6 kg)  Height: 5' 5 (1.651 m)    Body mass index is 53.77 kg/m.  Recent Results (from the past 2160 hours)  POCT Urinalysis Dipstick     Status: Abnormal   Collection Time: 12/11/23  2:39 PM  Result Value Ref Range   Color, UA orange    Clarity, UA clear    Glucose, UA Negative Negative   Bilirubin, UA neg    Ketones, UA neg    Spec Grav, UA 1.015 1.010 - 1.025   Blood, UA neg    pH, UA 6.5 5.0 - 8.0   Protein, UA Negative Negative   Urobilinogen, UA 0.2 0.2 or 1.0 E.U./dL   Nitrite, UA neg     Leukocytes, UA Moderate (2+) (A) Negative   Appearance orange    Odor normal     Diabetic Foot Exam:     PHQ2/9:    12/11/2023    2:38 PM 11/15/2023    3:08 PM 09/04/2023    3:19 PM 09/04/2023    8:55 AM 08/14/2023    7:59 AM  Depression screen PHQ 2/9  Decreased Interest 1 0 0  0  Down, Depressed, Hopeless 1 0 0  0  PHQ - 2 Score 2 0 0  0  Altered sleeping 1  0    Tired, decreased energy 1  0    Change in appetite 0  0    Feeling bad or failure about yourself  0  0    Trouble concentrating 0  0    Moving slowly or fidgety/restless 0  0    Suicidal thoughts 0  0    PHQ-9 Score 4  0     Difficult doing work/chores Not difficult at all  Not difficult at all       Information is confidential and restricted. Go to Review Flowsheets to unlock data.   Data saved with a previous flowsheet row definition    phq 9 is positive  Fall Risk:    12/11/2023    2:32 PM 11/15/2023    3:08 PM 09/04/2023    3:18 PM 08/14/2023    7:59 AM 07/10/2023    2:05 PM  Fall Risk   Falls in the past year? 0 0 1 1 1   Number falls in past yr: 0 0 0 1 1  Injury with Fall? 0 0  0  0  0   Risk for fall due to :  No Fall Risks Impaired balance/gait    Follow up Falls evaluation completed Falls evaluation completed Falls evaluation completed       Data saved with a previous flowsheet row definition     Assessment & Plan Morbid obesity with ongoing pharmacologic and lifestyle management Weight decreased from 336 lbs to 323 lbs with Mounjaro  2.5 mg. Engaged in physical activity and dietary changes. Discussed avoiding sweet beverages. - Increased Mounjaro  to 5 mg. - Continue physical activity and dietary modifications. - Avoid sweet beverages.  Obstructive sleep apnea Managed with CPAP. Weight loss expected to improve symptoms. - Continue CPAP therapy.  Chronic kidney disease, stage 3a GFR at 48. Advised to avoid NSAIDs. Discussed hydration and blood pressure control. - Avoid NSAIDs. - Ensure  adequate hydration. - Monitor blood pressure regularly.  Essential hypertension Managed with valsartan  and amlodipine . Blood pressure control crucial for kidney health. - Continue valsartan  and amlodipine . - Monitor blood pressure regularly.  Mild persistent asthma Well-controlled with montelukast  and Symbicort  as needed. - Continue montelukast  and Symbicort  as needed.  Major depressive disorder, recurrent Recurrent with recent stressors. On Wellbutrin , buspirone , and modafinil . Discussed impact of holidays and family dynamics. - Continue current psychiatric medications. - Follow up with psychiatrist as needed.  Painful micturition and vaginal discharge/itching, evaluating for genitourinary infection Urinalysis shows pus cells. Differential includes yeast infection, bacterial vaginosis, or STI. Recent new sexual partner. Discussed STI screening and treatment. - Ordered urine culture and STI panel. - Administered Rocephin  shot for possible bladder infection versus gonorrhea. - Prescribed Diflucan . - Advised on AZO for dysuria relief.  Vitamin B12 deficiency, on treatment Levels improved with sublingual supplementation.  General Health Maintenance Discussed regular screenings and lifestyle modifications. - Schedule mammogram. - Encouraged healthy diet and lifestyle modifications.

## 2023-12-12 LAB — URINE CULTURE
MICRO NUMBER:: 17307333
Result:: NO GROWTH
SPECIMEN QUALITY:: ADEQUATE

## 2023-12-13 ENCOUNTER — Ambulatory Visit: Admitting: Professional Counselor

## 2023-12-13 LAB — SYPHILIS: RPR W/REFLEX TO RPR TITER AND TREPONEMAL ANTIBODIES, TRADITIONAL SCREENING AND DIAGNOSIS ALGORITHM: RPR Ser Ql: NONREACTIVE

## 2023-12-13 LAB — HIV ANTIBODY (ROUTINE TESTING W REFLEX)
HIV 1&2 Ab, 4th Generation: NONREACTIVE
HIV FINAL INTERPRETATION: NEGATIVE

## 2023-12-16 ENCOUNTER — Other Ambulatory Visit: Payer: Self-pay | Admitting: Family Medicine

## 2023-12-16 ENCOUNTER — Ambulatory Visit: Payer: Self-pay | Admitting: Family Medicine

## 2023-12-16 DIAGNOSIS — N898 Other specified noninflammatory disorders of vagina: Secondary | ICD-10-CM

## 2023-12-16 MED ORDER — AZITHROMYCIN 500 MG PO TABS: 1000.0000 mg | ORAL_TABLET | Freq: Once | ORAL | 0 refills | Status: AC

## 2023-12-17 ENCOUNTER — Other Ambulatory Visit: Payer: Self-pay | Admitting: Family Medicine

## 2023-12-17 DIAGNOSIS — N898 Other specified noninflammatory disorders of vagina: Secondary | ICD-10-CM

## 2023-12-17 MED ORDER — FLUCONAZOLE 150 MG PO TABS: 150.0000 mg | ORAL_TABLET | ORAL | 0 refills | Status: AC

## 2023-12-20 ENCOUNTER — Ambulatory Visit: Admitting: Professional Counselor

## 2023-12-20 DIAGNOSIS — F603 Borderline personality disorder: Secondary | ICD-10-CM

## 2023-12-20 NOTE — Progress Notes (Signed)
°  THERAPIST PROGRESS NOTE  Virtual Visit via Video Note  I connected with Barbara Pennington on 12/20/2023 at 12:00 PM EST by a video enabled telemedicine application and verified that I am speaking with the correct person using two identifiers.  Location: Patient: Community (parked car at work) Provider: Remote office   I discussed the limitations of evaluation and management by telemedicine and the availability of in person appointments. The patient expressed understanding and agreed to proceed.   I discussed the assessment and treatment plan with the patient. The patient was provided an opportunity to ask questions and all were answered. The patient agreed with the plan and demonstrated an understanding of the instructions.   The patient was advised to call back or seek an in-person evaluation if the symptoms worsen or if the condition fails to improve as anticipated.  I provided 58 minutes of non-face-to-face time during this encounter. Barbara Pennington, [facility]  Session Time: 12:00 PM - 12:58 PM  Participation Level: Active  Behavioral Response: Well Groomed, Alert, Dysphoric  Type of Therapy: Group Therapy  Interventions: CBT and Supportive  Summary: Barbara Pennington is a 43 y.o. female who presents with a history of anxiety, depression, PTSD, BPD, and skin picking disorder. She appeared somber but oriented x5. She stated she has been struggling more than ever. However, she noted she has been putting herself first in trying to find happiness. She was receptive to re-framing thoughts about this being selfish.   Therapist Response: Conducted telehealth group session. Began session with introductions and review of group rules and expectations. Engaged group members in mindfulness exercise - guided meditation for body scan. Conducted brief check-in with each group member. Reviewed handouts and worksheets - Distress tolerance module - Goals and crisis skills. Passenger transport manager and assigned  homework to complete pros/cons of using crisis skills.  Suicidal/Homicidal: No  Diagnosis: Borderline personality disorder (HCC)  Patient/Guardian was advised Release of Information must be obtained prior to any record release in order to collaborate their care with an outside provider. Patient/Guardian was advised if they have not already done so to contact the registration department to sign all necessary forms in order for us  to release information regarding their care.   Consent: Patient/Guardian gives verbal consent for treatment and assignment of benefits for services provided during this visit. Patient/Guardian expressed understanding and agreed to proceed.   Barbara Pennington, [facility] 12/20/2023

## 2023-12-23 ENCOUNTER — Encounter: Payer: Self-pay | Admitting: Family Medicine

## 2023-12-24 ENCOUNTER — Telehealth: Admitting: Nurse Practitioner

## 2023-12-24 DIAGNOSIS — J4531 Mild persistent asthma with (acute) exacerbation: Secondary | ICD-10-CM

## 2023-12-24 MED ORDER — PREDNISONE 10 MG (21) PO TBPK: ORAL_TABLET | ORAL | 0 refills | Status: DC

## 2023-12-24 MED ORDER — BENZONATATE 100 MG PO CAPS: 200.0000 mg | ORAL_CAPSULE | Freq: Two times a day (BID) | ORAL | 0 refills | Status: AC | PRN

## 2023-12-24 NOTE — Progress Notes (Signed)
 Name: Barbara Pennington   MRN: [id number]    DOB: Jun 12, 1980   Date:12/24/2023       Progress Note  Subjective  Chief Complaint  No chief complaint on file.   I connected with  Barbara Pennington  on 12/24/2023 at 10:20 AM EST by a video enabled telemedicine application and verified that I am speaking with the correct person using two identifiers.  I discussed the limitations of evaluation and management by telemedicine and the availability of in person appointments. The patient expressed understanding and agreed to proceed with a virtual visit  Staff also discussed with the patient that there may be a patient responsible charge related to this service. Patient Location: home Provider Location: [facility] Additional Individuals present: alone  HPI   Discussed the use of AI scribe software for clinical note transcription with the patient, who gave verbal consent to proceed.  History of Present Illness Barbara Pennington is a 43 year old female with mild persistent asthma who presents with acute exacerbation.  Asthma exacerbation and respiratory symptoms/URI - Mild persistent asthma with acute exacerbation. - Current symptoms include chest tightness upon waking, loose cough, and swollen glands. - No fever present. - Extreme fatigue. - Sore throat and persistent cough began after exposure to a sick coworker on Friday; symptoms started Saturday, with sore throat and persistent cough by Sunday. - Uses Symbicort  two puffs twice daily, Singulair  10 mg daily, Flonase  daily, and Clarinex  5 mg daily for asthma management. - Has not been using rescue inhaler regularly. - Uses leftover Tessalon  Pearls for cough with some effectiveness.  Recent and recurrent infections - Frequent colds over the past year or two. - Significant episode of pneumonia in January. - A couple of months ago, had a cold unresponsive to over-the-counter medications (Mucinex , DayQuil); resolved with a prescription medication (name  unknown). - In November, prescribed azithromycin  for productive cough, which was effective. - History of frequent antibiotic use for various infections, including urinary tract infection, yeast infection, and dog bite. - Expresses concern about antibiotic effectiveness due to recent frequent use.    Patient Active Problem List   Diagnosis Date Noted   B12 deficiency 12/11/2023   Glaucoma 09/28/2023   Mild intermittent asthma without complication 05/31/2023   Chronic pain of left knee 05/09/2023   Status post medial meniscus repair (RIGHT) 05/09/2023   Chronic cough 02/13/2022   Skin-picking disorder 12/19/2021   Adjustment disorder with depressed mood 06/13/2021   Closed nondisplaced longitudinal fracture of right patella 06/09/2021   GAD (generalized anxiety disorder) 05/05/2021   Depressive disorder 05/05/2021   History of borderline personality disorder 05/05/2021   History of alcohol abuse 05/05/2021   Internal derangement of left knee 04/03/2021   Arthritis of left knee 04/03/2021   Metabolic syndrome 03/24/2021   Impaired fasting glucose 12/06/2020   Lymphedema 12/06/2020   Moderate episode of recurrent major depressive disorder (HCC) 02/08/2020   CKD (chronic kidney disease) stage 3, GFR 30-59 ml/min (HCC) 06/11/2019   PTSD (post-traumatic stress disorder) 01/14/2019   Borderline personality disorder (HCC) 01/14/2019   Anxiety 01/14/2019   Mixed hyperlipidemia 01/14/2019   Morbid obesity with BMI of 50.0-59.9, adult (HCC) 01/14/2019   OSA on CPAP 01/14/2019   Asthma, mild persistent 04/30/2018   Low back pain 08/20/2017   Fibroadenoma of breast, 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  ROS  Ten systems reviewed and is negative except as mentioned in HPI   Objective  Virtual encounter, vitals not obtained.  There is no height or weight on file to calculate BMI.  Nursing Note and Vital Signs reviewed.  Physical Exam  Awake, alert and oriented, speaking in complete sentences   No results found for this or any previous visit (from the past 72 hours).  Assessment & Plan  Assessment and Plan Assessment & Plan Mild persistent asthma with acute exacerbation/URI Acute exacerbation characterized by chest tightness and productive cough. Symptoms began after exposure to a sick individual at work. No fever reported. Steroid taper preferred over antibiotics due to asthma and wheezing, as it addresses lung inflammation. Discussed potential side effects of steroids, including increased blood sugar, jitteriness, and insomnia. - Prescribed steroid taper to address asthma exacerbation. -tessalon  perls sent in - Sent prescription for DayQuil and Pearls to pharmacy. - Advised to stay hydrated and take flu medications as needed.  Recommend taking mucinex , vitamin d , vitamin c, and zinc. Push fluids and get rest.         -Red flags and when to present for emergency care or RTC including fever >101.28F, chest pain, shortness of breath, new/worsening/un-resolving symptoms,  reviewed with patient at time of visit. Follow up and care instructions discussed and provided in AVS. - I discussed the assessment and treatment plan with the patient. The patient was provided an opportunity to ask questions and all were answered. The patient agreed with the plan and demonstrated an understanding of the instructions.  I provided 15 minutes of non-face-to-face time during this  encounter.  Mliss JULIANNA Spray, FNP      [1]  Current Outpatient Medications:    acetaminophen  (TYLENOL ) 500 MG tablet, Take 1,000 mg by mouth every 6 (six) hours as needed for moderate pain (pain score 4-6)., Disp: , Rfl:    amLODipine  (NORVASC ) 2.5 MG tablet, Take 1 tablet (2.5 mg total) by mouth daily., Disp: 90 tablet, Rfl: 1   atorvastatin  (LIPITOR) 20 MG tablet, Take 1 tablet (20 mg total) by mouth every morning., Disp: 90 tablet, Rfl: 1   budesonide -formoterol  (SYMBICORT ) 160-4.5 MCG/ACT inhaler, Inhale 2 puffs into the lungs in the morning and at bedtime., Disp: 1 each, Rfl: 12   buPROPion  (WELLBUTRIN  XL) 150 MG 24 hr tablet, Take 1 tablet (150 mg total) by mouth daily with breakfast. Stop wellbutrin  xl 300 mg, Disp: 90 tablet, Rfl: 3   busPIRone  (BUSPAR ) 30 MG tablet, TAKE 1 TABLET BY MOUTH 2 TIMES DAILY., Disp: 180 tablet, Rfl: 1   desloratadine  (CLARINEX ) 5 MG tablet, Take 1 tablet (5 mg total) by mouth daily., Disp: 90 tablet, Rfl: 1   escitalopram  (LEXAPRO ) 20 MG tablet, TAKE 1 TABLET BY MOUTH EVERY DAY, Disp: 90 tablet, Rfl: 1   fluconazole  (DIFLUCAN ) 150 MG tablet, Take 1 tablet (150 mg total) by mouth every other day., Disp: 3 tablet, Rfl: 0   fluticasone  (FLONASE ) 50 MCG/ACT nasal spray, Place 1 spray into both nostrils daily., Disp: 48 mL, Rfl:  1   Melatonin 10 MG CAPS, Take 10-20 mg by mouth at bedtime., Disp: , Rfl:    modafinil  (PROVIGIL ) 100 MG tablet, Take 1 tablet (100 mg total) by mouth daily., Disp: 90 tablet, Rfl: 0   montelukast  (SINGULAIR ) 10 MG tablet, TAKE 1 TABLET BY MOUTH EVERYDAY AT BEDTIME, Disp: 90 tablet, Rfl: 0   tirzepatide  (MOUNJARO ) 5 MG/0.5ML Pen, Inject 5 mg into the skin once a week., Disp: 2 mL, Rfl: 0   valsartan -hydrochlorothiazide  (DIOVAN -HCT) 160-25 MG tablet, Take 1 tablet by mouth daily., Disp: 90 tablet, Rfl: 0 [2]  Allergies Allergen Reactions   Wound Dressing Adhesive    Latex Rash   Metformin And Related     diarrhea

## 2023-12-25 ENCOUNTER — Ambulatory Visit: Admitting: Professional Counselor

## 2023-12-25 DIAGNOSIS — F424 Excoriation (skin-picking) disorder: Secondary | ICD-10-CM | POA: Diagnosis not present

## 2023-12-25 DIAGNOSIS — F603 Borderline personality disorder: Secondary | ICD-10-CM

## 2023-12-25 DIAGNOSIS — F411 Generalized anxiety disorder: Secondary | ICD-10-CM | POA: Diagnosis not present

## 2023-12-25 DIAGNOSIS — F4310 Post-traumatic stress disorder, unspecified: Secondary | ICD-10-CM

## 2023-12-25 NOTE — Progress Notes (Unsigned)
 THERAPIST PROGRESS NOTE  Virtual Visit via Video Note  I connected with Barbara Pennington on 12/25/2023 at  1:00 PM EST by a video enabled telemedicine application and verified that I am speaking with the correct person using two identifiers.  Location: Patient: Home Provider: Office   I discussed the limitations of evaluation and management by telemedicine and the availability of in person appointments. The patient expressed understanding and agreed to proceed.   I discussed the assessment and treatment plan with the patient. The patient was provided an opportunity to ask questions and all were answered. The patient agreed with the plan and demonstrated an understanding of the instructions.   The patient was advised to call back or seek an in-person evaluation if the symptoms worsen or if the condition fails to improve as anticipated.  I provided 52 minutes of non-face-to-face time during this encounter. Barbara Pennington, [facility]  Session Time: 1:00 PM - 1:52 PM  Participation Level: Active  Behavioral Response: Casual, Alert, Dysphoric  Type of Therapy: Individual Therapy  Treatment Goals addressed: Active Anxiety  LTG: I guess working through all the issues from the past. Learning how to better cope with stress. (Progressing)                Start:  02/05/23    Expected End:  02/04/24    Goal Note Reviewed 10/16/23 - I think I'm dealing better with stress and I need to improve ways of dealing with stress on a day-to-day basis. How do you know when you're worked through?   STG: I don't deal with stress well. I get overwhelmed. I shut down. I get angry. I can't communicate my thoughts. To improve knowledge of DBT skills to reduce sxs per self report over the next 12 weeks.  (Progressing)  Goal Note Reviewed 10/16/23 - Making little headway. I think I'm always critical of myself. I feel I am communicating better. I'm able to put my thoughts into words in a more calm manner and I  think I need more practice.   STG: Holding my tongue. Dealing with my anger and dealing with emotions and where they come from. To improve emotion regulation AEB self-report increase in emotional intelligence and use of coping mechanisms to regulate emotions over the next 12 weeks.   ProgressTowards Goals: Progressing  Interventions: CBT, Motivational Interviewing, and Supportive  Summary: Barbara Pennington is a 43 y.o. female who presents with a history of anxiety, trauma, BPD, and skin picking disorder. She appeared somber but oriented x5. She shared recent updates with her relationship. She noted some improvements but also noted ongoing concerns. Barbara Pennington shared about her struggles with yelling. She engaged in float back technique and identified memories from childhood that were being triggered. She connected with the emotions within her body and was tearful during exercise. She was in agreement to practice more of these exercises on her own.   Therapist Response: Conducted session with Barbara Pennington. Began session with check-in/update since previous session. Utilized empathetic and reflective listening. Used open-ended questions to facilitate discussion and summarized Barbara Pennington's thoughts/feelings. Used float back technique to explored what's being triggered. Normalized feelings of fear, protectiveness, and anger in response to those situations/memories. Encouraged Barbara Pennington to connect with the emotion within her body. Scheduled additional appointment and concluded session.   Suicidal/Homicidal: No  Plan: Return again in 2 weeks.  Diagnosis: Borderline personality disorder (HCC)  GAD (generalized anxiety disorder)  PTSD (post-traumatic stress disorder)  Skin-picking disorder  Collaboration of Care:  Medication Management AEB chart review  Patient/Guardian was advised Release of Information must be obtained prior to any record release in order to collaborate their care with an outside provider. Patient/Guardian was  advised if they have not already done so to contact the registration department to sign all necessary forms in order for us  to release information regarding their care.   Consent: Patient/Guardian gives verbal consent for treatment and assignment of benefits for services provided during this visit. Patient/Guardian expressed understanding and agreed to proceed.   Barbara Pennington, [facility] 12/25/2023

## 2023-12-27 ENCOUNTER — Ambulatory Visit (INDEPENDENT_AMBULATORY_CARE_PROVIDER_SITE_OTHER): Admitting: Professional Counselor

## 2023-12-27 DIAGNOSIS — F603 Borderline personality disorder: Secondary | ICD-10-CM

## 2023-12-27 NOTE — Progress Notes (Unsigned)
" °  THERAPIST PROGRESS NOTE  Virtual Visit via Video Note  I connected with Barbara Pennington on 12/27/2023 at 12:00 PM EST by a video enabled telemedicine application and verified that I am speaking with the correct person using two identifiers.  Location: Patient: Community (parked car) Provider: Remote office   I discussed the limitations of evaluation and management by telemedicine and the availability of in person appointments. The patient expressed understanding and agreed to proceed.   I discussed the assessment and treatment plan with the patient. The patient was provided an opportunity to ask questions and all were answered. The patient agreed with the plan and demonstrated an understanding of the instructions.   The patient was advised to call back or seek an in-person evaluation if the symptoms worsen or if the condition fails to improve as anticipated.  I provided 60 minutes of non-face-to-face time during this encounter. Barbara Pennington, [facility]  Session Time: 12:00 PM - 1:00 PM   Participation Level: Active  Behavioral Response: Well Groomed, Alert, Dysphoric  Type of Therapy: Group Therapy  Interventions: CBT and Supportive  Summary: Barbara Pennington is a 43 y.o. female who presents with ***.   Therapist Response: Conducted telehealth group session. Began session with introductions and review of group rules and expectations. Engaged group members in mindfulness exercise - guided meditation for progressive muscle relaxation. Conducted brief check-in with each group member. Reviewed handouts and worksheets - Distress module - Effective rethinking and paired muscle relaxation and ACCEPTS. Passenger transport manager  and assigned homework to practice skills discussed today.  Suicidal/Homicidal: No  Diagnosis: Borderline personality disorder (HCC)  Patient/Guardian was advised Release of Information must be obtained prior to any record release in order to collaborate their care with an  outside provider. Patient/Guardian was advised if they have not already done so to contact the registration department to sign all necessary forms in order for us  to release information regarding their care.   Consent: Patient/Guardian gives verbal consent for treatment and assignment of benefits for services provided during this visit. Patient/Guardian expressed understanding and agreed to proceed.   Barbara Pennington, [facility] 12/27/2023  "

## 2023-12-30 ENCOUNTER — Telehealth: Admitting: Psychiatry

## 2023-12-30 ENCOUNTER — Encounter: Payer: Self-pay | Admitting: Psychiatry

## 2023-12-30 DIAGNOSIS — F424 Excoriation (skin-picking) disorder: Secondary | ICD-10-CM

## 2023-12-30 DIAGNOSIS — F1011 Alcohol abuse, in remission: Secondary | ICD-10-CM | POA: Diagnosis not present

## 2023-12-30 DIAGNOSIS — Z8659 Personal history of other mental and behavioral disorders: Secondary | ICD-10-CM

## 2023-12-30 DIAGNOSIS — F411 Generalized anxiety disorder: Secondary | ICD-10-CM

## 2023-12-30 DIAGNOSIS — F4310 Post-traumatic stress disorder, unspecified: Secondary | ICD-10-CM

## 2023-12-30 MED ORDER — LAMOTRIGINE 25 MG PO TABS: ORAL_TABLET | ORAL | 0 refills | Status: DC

## 2023-12-30 NOTE — Progress Notes (Signed)
 Virtual Visit via Video Note  I connected with Barbara Pennington on 12/30/2023 at  1:40 PM EST by a video enabled telemedicine application and verified that I am speaking with the correct person using two identifiers.  Location Provider Location : [facility] Patient Location : Car  Participants: Patient , Provider   I discussed the limitations of evaluation and management by telemedicine and the availability of in person appointments. The patient expressed understanding and agreed to proceed.   I discussed the assessment and treatment plan with the patient. The patient was provided an opportunity to ask questions and all were answered. The patient agreed with the plan and demonstrated an understanding of the instructions.   The patient was advised to call back or seek an in-person evaluation if the symptoms worsen or if the condition fails to improve as anticipated.  BH MD OP Progress Note  12/30/2023 2:21 PM VINCY FELIZ  MRN:  [id number]  Chief Complaint:  Chief Complaint  Patient presents with   Medication Refill   Depression   Anxiety   Follow-up   Discussed the use of AI scribe software for clinical note transcription with the patient, who gave verbal consent to proceed.  History of Present Illness Barbara Pennington is a 43 year old Caucasian female, divorced, currently lives in [place], has a history of PTSD, anxiety, skin picking disorder, borderline personality disorder, morbid obesity, OSA on CPAP, hypertension/chronic kidney disease, asthma, chronic back pain, right sided knee injury was evaluated by telemedicine today for a follow-up appointment.  Ongoing struggles with skin picking continue to affect her, and she describes the current severity as high. She continues to pick at her skin, especially when it feels rough or dry, and finds controlling this behavior difficult, particularly during winter months when her skin becomes more prone to dryness and cracking. She uses lotion in  an effort to reduce roughness. She identifies skin picking as a persistent issue and continues to participate in dialectical behavior therapy (DBT) with her therapist, Ms. Almarie Ligas, attending both group and individual sessions, which she finds helpful and supportive.  Increased tension and conflict at home, especially with her partner of 6 years, have become more prominent. She reports that her partner prefers her to focus solely on spending time with him while watching television, which limits her ability to use coping strategies such as crocheting and coloring. Attempts to discuss these concerns often escalate into arguments, and she feels that both she and her partner struggle to feel heard. She expresses frustration that her partner resists individual or couples counseling, even though she encourages it, and she feels he is not open to working on their relationship. She reports that the relationship has been more rocky over the past couple of months than previously.  She reports taking clonazepam  sporadically (approximately 5 times) for anxiety on anticipated rough days, and she has not noticed negative side effects.  Her current medication regimen includes Lexapro  20 mg, buspirone  30 mg twice daily, and bupropion  150 mg. She expresses concern about the cumulative side effects of her medications, particularly fatigue.  She reports that her sleep is generally adequate and that she enjoys sleep when she is able to get it. She denies taking naps during the day. She denies any current or recent suicidal ideation, thoughts of self-harm, or thoughts of harming others. She denies experiencing intrusive memories, flashbacks, or nightmares.  She reports current cannabis use, stating she has a smoke every now and then to help calm herself.  She explicitly denies current alcohol use and cigarette smoking.    Visit Diagnosis:    ICD-10-CM   1. PTSD (post-traumatic stress disorder)  F43.10 lamoTRIgine   (LAMICTAL ) 25 MG tablet    2. GAD (generalized anxiety disorder)  F41.1 lamoTRIgine  (LAMICTAL ) 25 MG tablet    3. 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 History of borderline personality disorder  Z86.59     5. History of alcohol abuse  F10.11       Past Psychiatric History: I have reviewed past psychiatric history from progress note on 05/05/2021.  Patient was previously under the care of [facility].  Past Medical History:  Past Medical History:  Diagnosis Date   Allergy 2000   Anxiety 2010   Arthritis 2006   rhuematoid - mild - no current issues   Asthma 2003   Borderline personality disorder (HCC)    Chronic kidney disease    stage 3   Complication of anesthesia    Usually has low temp after surgery.After hysterectomy temp was 94   Depression 2010   GERD (gastroesophageal reflux disease) 2012   History of kidney stones    Hyperlipidemia    Hypertension 2013   Lump or mass in breast 2013   RIGHT BREAST   Motion sickness    repeated amusement park rides   PTSD (post-traumatic stress disorder)    Scoliosis    no current issues   Shortness of breath dyspnea    secondary to weight    Sleep apnea    uses cpap   Ulcer 2001    Past Surgical History:  Procedure Laterality Date   ABDOMINAL HYSTERECTOMY  01/08/2006   BREAST BIOPSY Left 01/09/2011   CORE - FIBROADENOMA VS PHYLLODES   CESAREAN SECTION  01/08/2005   COLONOSCOPY WITH PROPOFOL  N/A 10/09/2016   Procedure: COLONOSCOPY WITH PROPOFOL ;  Surgeon: Therisa Bi, MD;  Location: [facility];  Service: Gastroenterology;  Laterality: N/A;   COLONOSCOPY WITH PROPOFOL  N/A 11/09/2019   Procedure: COLONOSCOPY WITH PROPOFOL ;  Surgeon: Unk Corinn Skiff, MD;  Location: [facility] ENDOSCOPY;  Service: Gastroenterology;  Laterality: N/A;   ESOPHAGOGASTRODUODENOSCOPY     GUM SURGERY  01/08/2002   KNEE ARTHROSCOPY WITH MEDIAL MENISECTOMY Right 07/24/2021   Procedure: Right medial meniscus root repair and patella chondroplasty;  Surgeon:  Tobie Priest, MD;  Location: [facility];  Service: Orthopedics;  Laterality: Right;   PLANTAR FASCIA RELEASE Left 08/19/2014   Procedure: ENDOSCOPIC PLANTAR FASCIOTOMY RELEASE L WITH TOPAZ;  Surgeon: Donnice Cory, DPM;  Location: [facility];  Service: Podiatry;  Laterality: Left;  LMA WITH POPLITEAL BLOCK   TONSILLECTOMY  01/08/1982    Family Psychiatric History: I have reviewed family psychiatric history from progress note on 05/05/2021.  Family History:  Family History  Problem Relation Age of Onset   Obesity Mother    Diabetes Mother    High Cholesterol Mother    Hypertension Mother    Neuropathy Mother    Depression Mother    Anxiety disorder Mother    Asthma Mother    Arthritis Mother    Obesity Father    Hypertension Father    High Cholesterol Father    Depression Father    Anxiety disorder Father    Seizures Brother    Testicular cancer Brother    Cancer Brother    Breast cancer Maternal Grandmother    Hypertension Maternal Grandmother    Aneurysm Maternal Grandmother    Stroke Maternal Grandmother    Depression Maternal  Grandmother    Varicose Veins Maternal Grandmother    Hypertension Maternal Grandfather    High Cholesterol Maternal Grandfather    Heart disease Maternal Grandfather        has a pacemaker   Stroke Maternal Grandfather    Alzheimer's disease Paternal Grandmother    Dementia Paternal Grandmother    Cancer Paternal Grandmother    Colon cancer Paternal Grandfather    Cancer Paternal Grandfather    Mental illness Maternal Aunt    Suicidality Cousin     Social History: I have reviewed social history from progress note on 05/05/2021. Social History   Socioeconomic History   Marital status: Divorced    Spouse name: Not on file   Number of children: 2   Years of education: 14   Highest education level: Bachelor's degree (e.g., BA, AB, BS)  Occupational History   Not on file  Tobacco Use   Smoking status: Former    Current  packs/day: 0.00    Average packs/day: 0.3 packs/day for 10.0 years (2.5 ttl pk-yrs)    Types: Cigarettes    Start date: 07/04/2004    Quit date: 07/05/2014    Years since quitting: 9.4   Smokeless tobacco: Never  Vaping Use   Vaping status: Never Used  Substance and Sexual Activity   Alcohol use: Not Currently    Comment: rare   Drug use: No   Sexual activity: Yes    Partners: Male    Birth control/protection: Surgical    Comment: 2007  Other Topics Concern   Not on file  Social History Narrative   Not on file   Social Drivers of Health   Tobacco Use: Medium Risk (12/30/2023)   Patient History    Smoking Tobacco Use: Former    Smokeless Tobacco Use: Never    Passive Exposure: Not on file  Financial Resource Strain: Medium Risk (12/11/2023)   Overall Financial Resource Strain (CARDIA)    Difficulty of Paying Living Expenses: Somewhat hard  Food Insecurity: No Food Insecurity (12/11/2023)   Epic    Worried About Radiation Protection Practitioner of Food in the Last Year: Never true    Ran Out of Food in the Last Year: Never true  Transportation Needs: No Transportation Needs (12/11/2023)   Epic    Lack of Transportation (Medical): No    Lack of Transportation (Non-Medical): No  Physical Activity: Sufficiently Active (12/11/2023)   Exercise Vital Sign    Days of Exercise per Week: 3 days    Minutes of Exercise per Session: 60 min  Stress: Stress Concern Present (12/11/2023)   [employer] of Occupational Health - Occupational Stress Questionnaire    Feeling of Stress: To some extent  Social Connections: Socially Isolated (12/11/2023)   Social Connection and Isolation Panel    Frequency of Communication with Friends and Family: Three times a week    Frequency of Social Gatherings with Friends and Family: Once a week    Attends Religious Services: Never    Database Administrator or Organizations: No    Attends Banker Meetings: Not on file    Marital Status: Divorced  Depression  (PHQ2-9): Low Risk (12/11/2023)   Depression (PHQ2-9)    PHQ-2 Score: 4  Alcohol Screen: Low Risk (12/11/2023)   Alcohol Screen    Last Alcohol Screening Score (AUDIT): 1  Housing: High Risk (12/11/2023)   Epic    Unable to Pay for Housing in the Last Year: Yes    Number of  Times Moved in the Last Year: 0    Homeless in the Last Year: No  Utilities: Not At Risk (11/15/2023)   Epic    Threatened with loss of utilities: No  Health Literacy: Adequate Health Literacy (11/15/2023)   B1300 Health Literacy    Frequency of need for help with medical instructions: Never    Allergies: Allergies[1]  Metabolic Disorder Labs: Lab Results  Component Value Date   HGBA1C 5.6 09/04/2023   MPG 114 09/04/2023   MPG 103 11/08/2022   No results found for: PROLACTIN Lab Results  Component Value Date   CHOL 147 09/04/2023   TRIG 123 09/04/2023   HDL 43 (L) 09/04/2023   CHOLHDL 3.4 09/04/2023   VLDL 27 08/06/2019   LDLCALC 82 09/04/2023   LDLCALC 80 11/08/2022   Lab Results  Component Value Date   TSH 1.83 12/15/2021   TSH 2.59 04/04/2018    Therapeutic Level Labs: No results found for: LITHIUM No results found for: VALPROATE No results found for: CBMZ  Current Medications: Current Outpatient Medications  Medication Sig Dispense Refill   lamoTRIgine  (LAMICTAL ) 25 MG tablet Take 1 tablet (25 mg total) by mouth daily for 15 days, THEN 1 tablet (25 mg total) 2 (two) times daily for 15 days. 45 tablet 0   acetaminophen  (TYLENOL ) 500 MG tablet Take 1,000 mg by mouth every 6 (six) hours as needed for moderate pain (pain score 4-6).     amLODipine  (NORVASC ) 2.5 MG tablet Take 1 tablet (2.5 mg total) by mouth daily. 90 tablet 1   atorvastatin  (LIPITOR) 20 MG tablet Take 1 tablet (20 mg total) by mouth every morning. 90 tablet 1   benzonatate  (TESSALON ) 100 MG capsule Take 2 capsules (200 mg total) by mouth 2 (two) times daily as needed for cough. 20 capsule 0   budesonide -formoterol   (SYMBICORT ) 160-4.5 MCG/ACT inhaler Inhale 2 puffs into the lungs in the morning and at bedtime. 1 each 12   buPROPion  (WELLBUTRIN  XL) 150 MG 24 hr tablet Take 1 tablet (150 mg total) by mouth daily with breakfast. Stop wellbutrin  xl 300 mg 90 tablet 3   busPIRone  (BUSPAR ) 30 MG tablet TAKE 1 TABLET BY MOUTH 2 TIMES DAILY. 180 tablet 1   desloratadine  (CLARINEX ) 5 MG tablet Take 1 tablet (5 mg total) by mouth daily. 90 tablet 1   escitalopram  (LEXAPRO ) 20 MG tablet TAKE 1 TABLET BY MOUTH EVERY DAY 90 tablet 1   fluconazole  (DIFLUCAN ) 150 MG tablet Take 1 tablet (150 mg total) by mouth every other day. 3 tablet 0   fluticasone  (FLONASE ) 50 MCG/ACT nasal spray Place 1 spray into both nostrils daily. 48 mL 1   Melatonin 10 MG CAPS Take 10-20 mg by mouth at bedtime.     modafinil  (PROVIGIL ) 100 MG tablet Take 1 tablet (100 mg total) by mouth daily. 90 tablet 0   montelukast  (SINGULAIR ) 10 MG tablet TAKE 1 TABLET BY MOUTH EVERYDAY AT BEDTIME 90 tablet 0   predniSONE  (STERAPRED UNI-PAK 21 TAB) 10 MG (21) TBPK tablet Use as directed. 21 each 0   tirzepatide  (MOUNJARO ) 5 MG/0.5ML Pen Inject 5 mg into the skin once a week. 2 mL 0   valsartan -hydrochlorothiazide  (DIOVAN -HCT) 160-25 MG tablet Take 1 tablet by mouth daily. 90 tablet 0   No current facility-administered medications for this visit.     Musculoskeletal: Strength & Muscle Tone: UTA Gait & Station: Seated Patient leans: N/A  Psychiatric Specialty Exam: Review of Systems  Psychiatric/Behavioral:  The  patient is nervous/anxious.        Mood swings    There were no vitals taken for this visit.There is no height or weight on file to calculate BMI.  General Appearance: Casual  Eye Contact:  Fair  Speech:  Garbled  Volume:  Normal  Mood:  Anxious, mood swings  Affect:  Appropriate  Thought Process:  Goal Directed and Descriptions of Associations: Intact  Orientation:  Full (Time, Place, and Person)  Thought Content: Logical   Suicidal  Thoughts:  No  Homicidal Thoughts:  No  Memory:  Immediate;   Fair Recent;   Fair Remote;   Fair  Judgement:  Fair  Insight:  Fair  Psychomotor Activity:  Normal  Concentration:  Concentration: Fair and Attention Span: Fair  Recall:  [employer] of Knowledge: Fair  Language: Fair  Akathisia:  No  Handed:  Right  AIMS (if indicated): not done  Assets:  Communication Skills Desire for Improvement Housing Social Support  ADL's:  Intact  Cognition: WNL  Sleep:  Fair   Screenings: AIMS    Flowsheet Row Video Visit from 06/13/2021 in [facility] Office Visit from 05/05/2021 in [facility]  AIMS Total Score 0 0   GAD-7    Flowsheet Row Office Visit from 12/11/2023 in [facility] Office Visit from 09/04/2023 in [facility] Office Visit from 05/08/2023 in [facility] Office Visit from 01/24/2023 in [facility] Office Visit from 01/15/2023 in [facility]  Total GAD-7 Score 2 5 0 0 0   PHQ2-9    Flowsheet Row Office Visit from 12/11/2023 in [facility] Most recent reading at 12/11/2023  2:38 PM Office Visit from 11/15/2023 in [facility] Most recent reading at 11/15/2023  3:08 PM Office Visit from 09/04/2023 in [facility] Most recent reading at 09/04/2023  3:19 PM Office Visit from 09/04/2023 in [facility] Most recent reading at 09/04/2023  8:55 AM Procedure visit from 08/14/2023 in [facility] Most recent reading at 08/14/2023  7:59 AM  PHQ-2 Total Score 2 0 0 0 0  PHQ-9 Total Score 4 -- 0 -- --   Flowsheet Row Video Visit from 12/30/2023 in [facility] ED from 11/23/2023 in [facility] Visit from 09/04/2023 in [facility]  C-SSRS RISK CATEGORY No Risk No Risk No Risk     Assessment and Plan: KYTZIA GIENGER is a 43 year old Caucasian female who presented for a follow-up appointment, discussed assessment and plan as noted below.  1. PTSD (post-traumatic stress disorder)-unstable Currently denies any intrusive memories or flashbacks although does mood swings anxiety mostly due to situational stressors.  Discussed addition of an atypical antipsychotic like Latuda/Rexulti or Abilify.  Patient declines.  Agreeable to trial of Lamictal .  Aware of side effects including Stevens-Johnson syndrome risk. Start Lamictal  25 mg daily for 15 days and increase to Lamictal  25 mg twice daily Continue Lexapro  20 mg daily Continue Buspirone  30 mg twice daily Continue Wellbutrin  150 mg daily  2. GAD (generalized anxiety disorder)-unstable Currently reports worsening anxiety symptoms due to situational stressors. Encouraged to continue psychotherapy sessions/DBT I have coordinated care with Ms. Veva, therapist Continue Lexapro  20 mg daily Continue  BuSpar  30 mg twice daily Currently using Clonazepam  as needed from a previous prescription, discussed to abstain from cannabis use for future prescriptions of clonazepam .  3. Skin-picking disorder-improving Ongoing skin picking although with some improvement Encouraged to continue psychotherapy sessions.  4. History of borderline personality disorder Chronic-encouraged to continue DBT  5.  Alcohol use disorder in remission Currently in remission Sober since 2017  Follow-up Follow-up in clinic in 3 weeks or sooner if needed.  Collaboration of Care: Collaboration of Care: Referral or follow-up with counselor/therapist AEB encouraged to continue psychotherapy sessions I have coordinated care with Ms.  Veva.  Patient/Guardian was advised Release of Information must be obtained prior to any record release in order to collaborate their care with an outside provider. Patient/Guardian was advised if they have not already done so to contact the registration department to sign all necessary forms in order for us  to release information regarding their care.   Consent: Patient/Guardian gives verbal consent for treatment and assignment of benefits for services provided during this visit. Patient/Guardian expressed understanding and agreed to proceed.   This note was generated in part or whole with voice recognition software. Voice recognition is usually quite accurate but there are transcription errors that can and very often do occur. I apologize for any typographical errors that were not detected and corrected.    Yaretsi Humphres, MD 12/30/2023, 2:21 PM     [1]  Allergies Allergen Reactions   Wound Dressing Adhesive    Latex Rash   Metformin And Related     diarrhea

## 2023-12-30 NOTE — Patient Instructions (Signed)
 Lamotrigine Tablets What is this medication? LAMOTRIGINE (la MOE Patrecia Pace) prevents and controls seizures in people with epilepsy. It may also be used to treat bipolar disorder. It works by calming overactive nerves in your body. This medicine may be used for other purposes; ask your health care provider or pharmacist if you have questions. COMMON BRAND NAME(S): Lamictal, Subvenite What should I tell my care team before I take this medication? They need to know if you have any of these conditions: Heart disease History of irregular heartbeat Immune system problems Kidney disease Liver disease Low levels of folic acid in the blood Lupus Mental health condition Suicidal thoughts, plans, or attempt by you or a family member An unusual or allergic reaction to lamotrigine, other medications, foods, dyes, or preservatives Pregnant or trying to get pregnant Breastfeeding How should I use this medication? Take this medication by mouth with a glass of water. Follow the directions on the prescription label. Do not chew these tablets. If this medication upsets your stomach, take it with food or milk. Take your doses at regular intervals. Do not take your medication more often than directed. A special MedGuide will be given to you by the pharmacist with each new prescription and refill. Be sure to read this information carefully each time. Talk to your care team about the use of this medication in children. While this medication may be prescribed for children as young as 2 years for selected conditions, precautions do apply. Overdosage: If you think you have taken too much of this medicine contact a poison control center or emergency room at once. NOTE: This medicine is only for you. Do not share this medicine with others. What if I miss a dose? If you miss a dose, take it as soon as you can. If it is almost time for your next dose, take only that dose. Do not take double or extra doses. What may  interact with this medication? Atazanavir Certain medications for irregular heartbeat Certain medications for seizures, such as carbamazepine, phenobarbital, phenytoin, primidone, or valproic acid Estrogen or progestin hormones Lopinavir Rifampin Ritonavir This list may not describe all possible interactions. Give your health care provider a list of all the medicines, herbs, non-prescription drugs, or dietary supplements you use. Also tell them if you smoke, drink alcohol, or use illegal drugs. Some items may interact with your medicine. What should I watch for while using this medication? Visit your care team for regular checks on your progress. If you take this medication for seizures, wear a Medic Alert bracelet or necklace. Carry an identification card with information about your condition, medications, and care team. It is important to take this medication exactly as directed. When first starting treatment, your dose will need to be adjusted slowly. It may take weeks or months before your dose is stable. You should contact your care team if your seizures get worse or if you have any new types of seizures. Do not stop taking this medication unless instructed by your care team. Stopping your medication suddenly can increase your seizures or their severity. This medication may cause serious skin reactions. They can happen weeks to months after starting the medication. Contact your care team right away if you notice fevers or flu-like symptoms with a rash. The rash may be red or purple and then turn into blisters or peeling of the skin. You may also notice a red rash with swelling of the face, lips, or lymph nodes in your neck or under your  arms. This medication may affect your coordination, reaction time, or judgment. Do not drive or operate machinery until you know how this medication affects you. Sit up or stand slowly to reduce the risk of dizzy or fainting spells. Drinking alcohol with this  medication can increase the risk of these side effects. If you are taking this medication for bipolar disorder, it is important to report any changes in your mood to your care team. If your condition gets worse, you get mentally depressed, feel very hyperactive or manic, have difficulty sleeping, or have thoughts of hurting yourself or committing suicide, you need to get help from your care team right away. If you are a caregiver for someone taking this medication for bipolar disorder, you should also report these behavioral changes right away. The use of this medication may increase the chance of suicidal thoughts or actions. Pay special attention to how you are responding while on this medication. Your mouth may get dry. Chewing sugarless gum or sucking hard candy and drinking plenty of water may help. Contact your care team if the problem does not go away or is severe. If you become pregnant while using this medication, you may enroll in the [place] Antiepileptic Drug Pregnancy Registry by calling [phone]. This registry collects information about the safety of antiepileptic medication use during pregnancy. This medication may cause a decrease in folic acid. You should make sure that you get enough folic acid while you are taking this medication. Discuss the foods you eat and the vitamins you take with your care team. What side effects may I notice from receiving this medication? Side effects that you should report to your care team as soon as possible: Allergic reactions--skin rash, itching, hives, swelling of the face, lips, tongue, or throat Change in vision Fever, neck pain or stiffness, sensitivity to light, headache, nausea, vomiting, confusion, which may be signs of meningitis Fever, rash, swollen lymph nodes, confusion, trouble walking, loss of balance or coordination, seizures Heart rhythm changes--fast or irregular heartbeat, dizziness, feeling faint or lightheaded, chest pain,  trouble breathing Infection--fever, chills, cough, or sore throat Low red blood cell level--unusual weakness or fatigue, dizziness, headache, trouble breathing Rash, fever, and swollen lymph nodes Redness, blistering, peeling, or loosening of the skin, including inside the mouth Thoughts of suicide or self-harm, worsening mood, feelings of depression Unusual bruising or bleeding Side effects that usually do not require medical attention (report these to your care team if they continue or are bothersome): Diarrhea Dizziness Drowsiness Headache Nausea Stomach pain Tremors or shaking This list may not describe all possible side effects. Call your doctor for medical advice about side effects. You may report side effects to FDA at 1-800-FDA-1088. Where should I keep my medication? Keep out of the reach of children and pets. Store at [employer] [facility] (77 degrees F). Protect from light. Get rid of any unused medication after the expiration date. To get rid of medications that are no longer needed or have expired: Take the medication to a medication take-back program. Check with your pharmacy or law enforcement to find a location. If you cannot return the medication, check the label or package insert to see if the medication should be thrown out in the garbage or flushed down the toilet. If you are not sure, ask your care team. If it is safe to put it in the trash, empty the medication out of the container. Mix the medication with cat litter, dirt, coffee grounds, or other unwanted substance.  Seal the mixture in a bag or container. Put it in the trash. NOTE: This sheet is a summary. It may not cover all possible information. If you have questions about this medicine, talk to your doctor, pharmacist, or health care provider.  2024 Elsevier/Gold Standard (2022-12-07 00:00:00)

## 2024-01-03 ENCOUNTER — Ambulatory Visit: Admitting: Professional Counselor

## 2024-01-07 ENCOUNTER — Other Ambulatory Visit: Payer: Self-pay | Admitting: Family Medicine

## 2024-01-07 ENCOUNTER — Encounter: Payer: Self-pay | Admitting: Family Medicine

## 2024-01-07 MED ORDER — TIRZEPATIDE 7.5 MG/0.5ML ~~LOC~~ SOAJ: 7.5000 mg | SUBCUTANEOUS | 0 refills | Status: AC

## 2024-01-08 ENCOUNTER — Ambulatory Visit: Admitting: Professional Counselor

## 2024-01-10 ENCOUNTER — Ambulatory Visit: Admitting: Professional Counselor

## 2024-01-16 ENCOUNTER — Other Ambulatory Visit: Payer: Self-pay | Admitting: Family Medicine

## 2024-01-16 DIAGNOSIS — I10 Essential (primary) hypertension: Secondary | ICD-10-CM

## 2024-01-16 NOTE — Telephone Encounter (Signed)
 Requested Prescriptions  Pending Prescriptions Disp Refills   amLODipine  (NORVASC ) 2.5 MG tablet [Pharmacy Med Name: AMLODIPINE  BESYLATE 2.5 MG TAB] 90 tablet 1    Sig: TAKE 1 TABLET BY MOUTH EVERY DAY     Cardiovascular: Calcium  Channel Blockers 2 Passed - 01/16/2024  3:25 PM      Passed - Last BP in normal range    BP Readings from Last 1 Encounters:  12/11/23 123/69         Passed - Last Heart Rate in normal range    Pulse Readings from Last 1 Encounters:  12/11/23 73         Passed - Valid encounter within last 6 months    Recent Outpatient Visits           3 weeks ago Mild persistent asthma with acute exacerbation   [facility] Gareth Clarity F, FNP   1 month ago Morbid obesity with BMI of 50.0-59.9, adult [facility])   [facility] Glenard Mire, MD   2 months ago Well woman exam   [facility] Glenard Mire, MD   4 months ago Moderate episode of recurrent major depressive disorder [facility])   [facility] [place], Mire, MD   7 months ago Morbid obesity with BMI of 50.0-59.9, adult [facility]   [facility], Krichna, MD

## 2024-01-17 ENCOUNTER — Ambulatory Visit: Admitting: Professional Counselor

## 2024-01-22 ENCOUNTER — Ambulatory Visit (INDEPENDENT_AMBULATORY_CARE_PROVIDER_SITE_OTHER): Admitting: Professional Counselor

## 2024-01-22 DIAGNOSIS — F4310 Post-traumatic stress disorder, unspecified: Secondary | ICD-10-CM

## 2024-01-22 DIAGNOSIS — F411 Generalized anxiety disorder: Secondary | ICD-10-CM | POA: Diagnosis not present

## 2024-01-22 DIAGNOSIS — F603 Borderline personality disorder: Secondary | ICD-10-CM

## 2024-01-22 DIAGNOSIS — F424 Excoriation (skin-picking) disorder: Secondary | ICD-10-CM

## 2024-01-22 NOTE — Progress Notes (Signed)
 " THERAPIST PROGRESS NOTE  Virtual Visit via Video Note  I connected with Barbara Pennington on 01/22/2024 at  1:00 PM EST by a video enabled telemedicine application and verified that I am speaking with the correct person using two identifiers.  Location: Patient: Home Provider: Remote office   I discussed the limitations of evaluation and management by telemedicine and the availability of in person appointments. The patient expressed understanding and agreed to proceed.   I discussed the assessment and treatment plan with the patient. The patient was provided an opportunity to ask questions and all were answered. The patient agreed with the plan and demonstrated an understanding of the instructions.   The patient was advised to call back or seek an in-person evaluation if the symptoms worsen or if the condition fails to improve as anticipated.  I provided 36 minutes of non-face-to-face time during this encounter. Barbara Pennington, [facility]  Session Time: 1:01 PM - 1:37 PM  Participation Level: Active  Behavioral Response: Casual, Alert, Anxious  Type of Therapy: Individual Therapy  Treatment Goals addressed: Active Anxiety  LTG: I guess working through all the issues from the past. Learning how to better cope with stress. (Progressing)                Start:  02/05/23    Expected End:  02/04/24    Goal Note Reviewed 10/16/23 - I think I'm dealing better with stress and I need to improve ways of dealing with stress on a day-to-day basis. How do you know when you're worked through?   STG: I don't deal with stress well. I get overwhelmed. I shut down. I get angry. I can't communicate my thoughts. To improve knowledge of DBT skills to reduce sxs per self report over the next 12 weeks.  (Progressing)  Goal Note Reviewed 10/16/23 - Making little headway. I think I'm always critical of myself. I feel I am communicating better. I'm able to put my thoughts into words in a more calm manner and  I think I need more practice.   STG: Holding my tongue. Dealing with my anger and dealing with emotions and where they come from. To improve emotion regulation AEB self-report increase in emotional intelligence and use of coping mechanisms to regulate emotions over the next 12 weeks.   ProgressTowards Goals: Progressing  Interventions: CBT, Motivational Interviewing, and Supportive  Summary: Barbara Pennington is a 44 y.o. female who presents with a history of anxiety, trauma, BPD, and skin picking disorder. She appeared anxious but oriented x5. She shared updates since last session and noted impact it had on her attendance to therapy. Barbara Pennington agreed she needs to discontinue group therapy at this time. 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 She attempted to identify ways she can prioritize herself as well.. Syna discussed recent relationship status and how they are trying to work through recent issues.   Therapist Response: Conducted session with Barbara Pennington. Began session with check-in/update since previous session. Utilized empathetic and reflective listening. Used open-ended questions to facilitate discussion and summarized [name]'s thoughts/feelings. Discussed plan to discontinue group therapy. Explored reasons for engaging in behavior and risks/benefits of taking on additional responsibilities. Explored ways to take care of herself while taking care of others. Noted recent updates within relationship. Scheduled additional appointment and concluded session.   Suicidal/Homicidal: No  Plan: Return again in 2 weeks.  Diagnosis: Borderline personality disorder (HCC)  PTSD (post-traumatic stress disorder)  Skin-picking disorder  GAD (  generalized anxiety disorder)  Collaboration of Care: Medication Management AEB chart review  Patient/Guardian was advised Release of Information must be obtained prior to any record release in order to collaborate their care with an  outside provider. Patient/Guardian was advised if they have not already done so to contact the registration department to sign all necessary forms in order for us  to release information regarding their care.   Consent: Patient/Guardian gives verbal consent for treatment and assignment of benefits for services provided during this visit. Patient/Guardian expressed understanding and agreed to proceed.   Barbara Pennington, [facility] 01/22/2024  "

## 2024-01-24 ENCOUNTER — Ambulatory Visit: Admitting: Professional Counselor

## 2024-01-27 IMAGING — CR DG KNEE COMPLETE 4+V*L*
1 series · 5 of 5 positions shown · non-contrast
Comparison: None.

CLINICAL DATA: Status post fall.

EXAM:
LEFT KNEE - COMPLETE 4+ VIEW

[Series 1: dg knee complete 4 views left · 0.14mm/px · 5 of 5 slices shown]
[im 1/5]
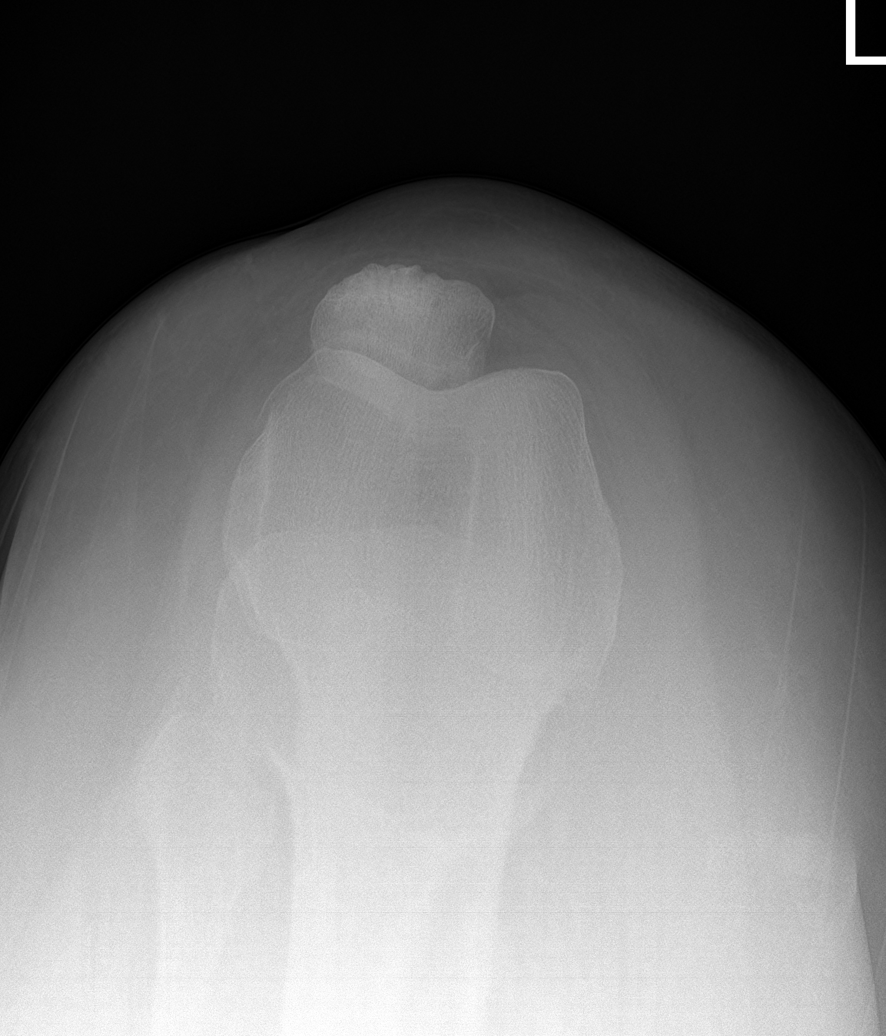
[im 2/5]
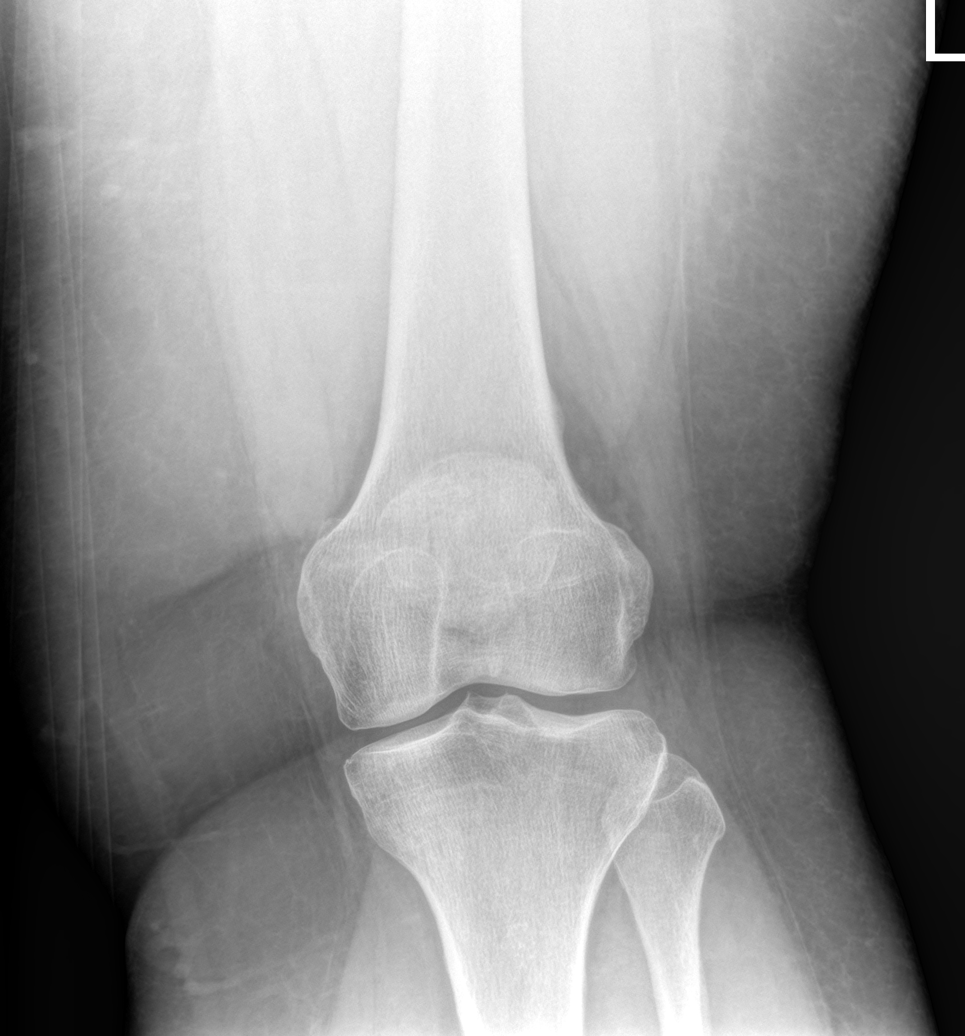
[im 3/5]
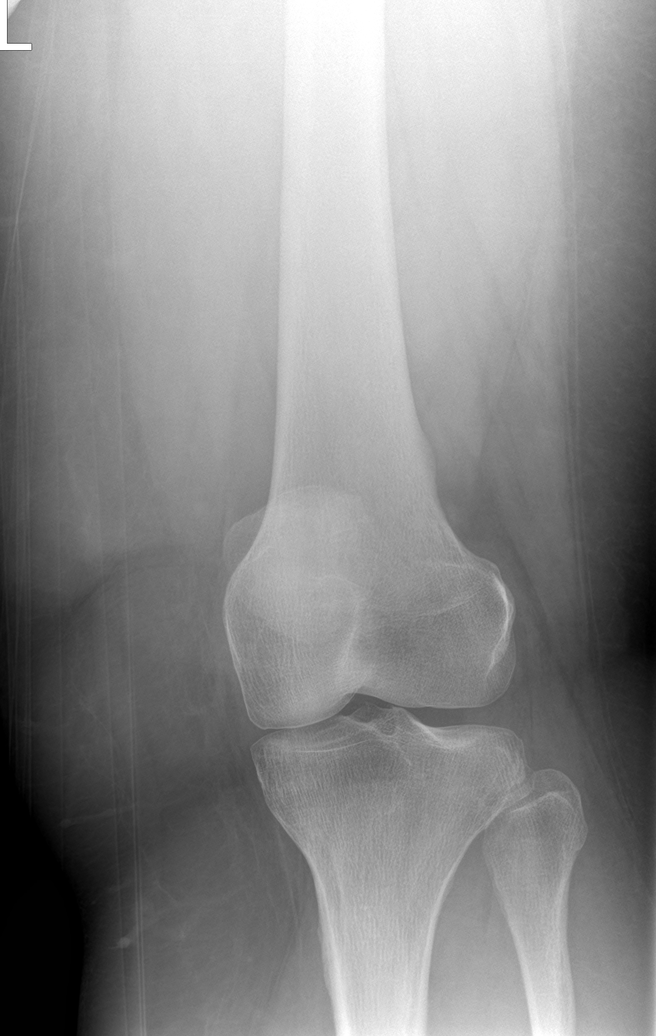
[im 4/5]
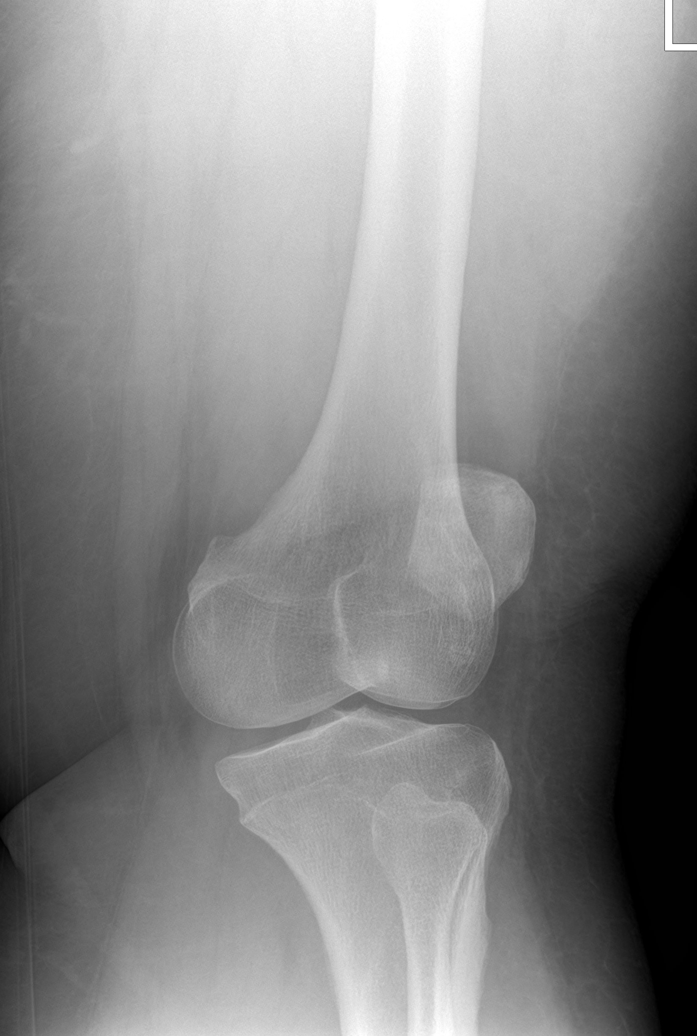
[im 5/5]
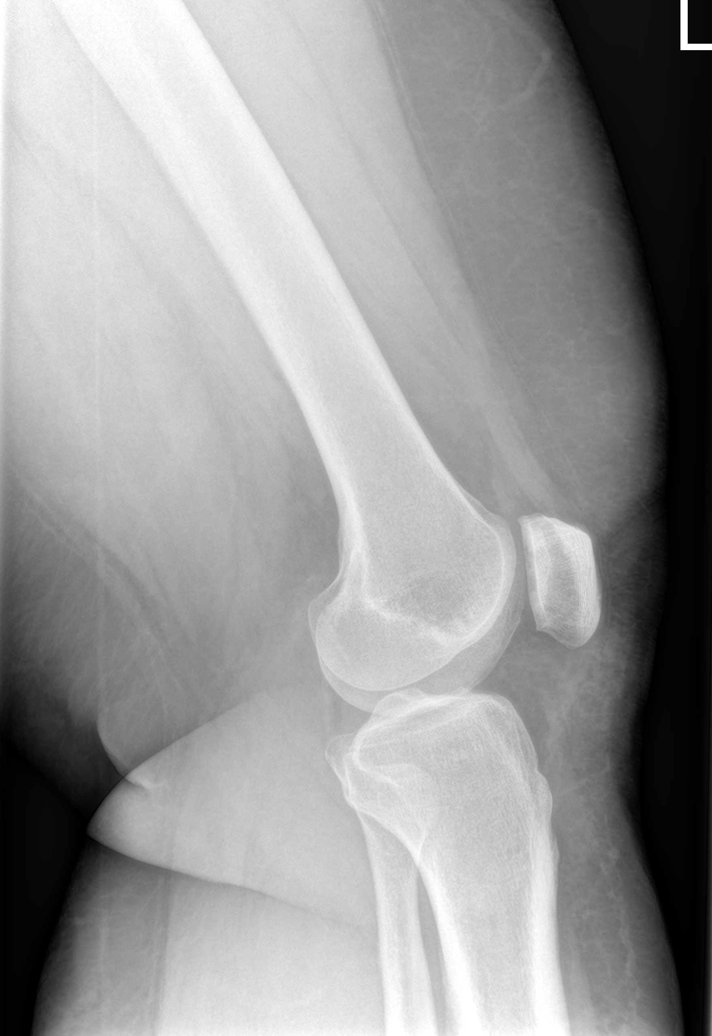

[5 of 5 positions shown; findings below may reference images not displayed]

FINDINGS: No evidence of acute fracture or dislocation. No evidence of
arthropathy or other focal bone abnormality. A very small joint
effusion is noted.
IMPRESSION: 1. No acute osseous abnormality.
2. Very small joint effusion.

## 2024-01-28 ENCOUNTER — Other Ambulatory Visit: Payer: Self-pay | Admitting: Psychiatry

## 2024-01-28 DIAGNOSIS — F4310 Post-traumatic stress disorder, unspecified: Secondary | ICD-10-CM

## 2024-01-28 DIAGNOSIS — F411 Generalized anxiety disorder: Secondary | ICD-10-CM

## 2024-01-29 ENCOUNTER — Encounter: Payer: Self-pay | Admitting: Psychiatry

## 2024-01-29 ENCOUNTER — Telehealth: Admitting: Psychiatry

## 2024-01-29 DIAGNOSIS — F424 Excoriation (skin-picking) disorder: Secondary | ICD-10-CM | POA: Diagnosis not present

## 2024-01-29 DIAGNOSIS — F1091 Alcohol use, unspecified, in remission: Secondary | ICD-10-CM

## 2024-01-29 DIAGNOSIS — Z8659 Personal history of other mental and behavioral disorders: Secondary | ICD-10-CM

## 2024-01-29 DIAGNOSIS — F4310 Post-traumatic stress disorder, unspecified: Secondary | ICD-10-CM | POA: Diagnosis not present

## 2024-01-29 DIAGNOSIS — F411 Generalized anxiety disorder: Secondary | ICD-10-CM | POA: Diagnosis not present

## 2024-01-29 NOTE — Progress Notes (Signed)
 Virtual Visit via Video Note  I connected with Barbara Pennington on 01/29/24 at  1:30 PM EST by a video enabled telemedicine application and verified that I am speaking with the correct person using two identifiers.  Location Provider Location : [facility] Patient Location : Home  Participants: Patient , Provider     I discussed the limitations of evaluation and management by telemedicine and the availability of in person appointments. The patient expressed understanding and agreed to proceed.   I discussed the assessment and treatment plan with the patient. The patient was provided an opportunity to ask questions and all were answered. The patient agreed with the plan and demonstrated an understanding of the instructions.   The patient was advised to call back or seek an in-person evaluation if the symptoms worsen or if the condition fails to improve as anticipated.  BH MD OP Progress Note  01/29/2024 1:59 PM AJIAH MCGLINN  MRN:  [id number]  Chief Complaint:  Chief Complaint  Patient presents with   Follow-up   Medication Refill   Anxiety   Discussed the use of AI scribe software for clinical note transcription with the patient, who gave verbal consent to proceed.  History of Present Illness Barbara Pennington is a 44 year old Caucasian female, divorced, currently lives in [place], has a history of PTSD, anxiety, skin picking disorder, borderline personality disorder, morbid obesity, OSA on CPAP, hypertension/chronic kidney disease, asthma, chronic back pain, right sided knee injury was evaluated by telemedicine today for a follow-up appointment.  She reports that her mood has remained stable and she feels she is doing well overall, except for a recent episode approximately 1 to 1.5 weeks ago when she experienced a sudden and intense outburst of anger. Describing the event, she notes becoming extremely angry very quickly, with the episode resolving as rapidly as it began. She explains that  missing her medications for 2 days led to this episode, as she had not filled her medication organizer and did not feel like preparing her medications at night. Since resuming her medications, she states that her mood has stabilized.  She currently takes lamotrigine  25 mg twice daily and finds it helpful. Her regimen also includes buspirone  and bupropion . She uses clonazepam  only as needed, noting that a prescription for 15 pills has lasted almost a year, with 3 pills remaining. She reports that she has not taken Lexapro  for some time and does not have a current bottle. She believes she may have run out of Lexapro  in October or November and has not experienced any previous side effects since stopping it.  She describes increased skin picking, which she relates to areas of skin irritation or injury rather than intentional self-harm. She reports making efforts to allow her skin to heal appropriately. She continues to work with her therapist Ms. Veva, though her inconsistent work schedule has made regular group attendance difficult.  She states that she has focused on helping an elderly family member with medication management, which has contributed to her own lapses in medication adherence.  She denies any current thoughts of harming herself or others.  She reports current use of sativa cannabis purchased from a convenience store.   Visit Diagnosis:    ICD-10-CM   1. PTSD (post-traumatic stress disorder)  F43.10     2. GAD (generalized anxiety disorder)  F41.1     3. 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 History of borderline personality disorder  Z86.59     5.  Alcohol use disorder in remission  F10.91       Past Psychiatric History: I have reviewed past psychiatric history from progress note on 05/05/2021.  Patient was previously under the care of [facility].  Past Medical History:  Past Medical History:  Diagnosis Date   Allergy 2000   Anxiety 2010   Arthritis 2006   rhuematoid - mild - no  current issues   Asthma 2003   Borderline personality disorder (HCC)    Chronic kidney disease    stage 3   Complication of anesthesia    Usually has low temp after surgery.After hysterectomy temp was 94   Depression 2010   GERD (gastroesophageal reflux disease) 2012   History of kidney stones    Hyperlipidemia    Hypertension 2013   Lump or mass in breast 2013   RIGHT BREAST   Motion sickness    repeated amusement park rides   PTSD (post-traumatic stress disorder)    Scoliosis    no current issues   Shortness of breath dyspnea    secondary to weight    Sleep apnea    uses cpap   Ulcer 2001    Past Surgical History:  Procedure Laterality Date   ABDOMINAL HYSTERECTOMY  01/08/2006   BREAST BIOPSY Left 01/09/2011   CORE - FIBROADENOMA VS PHYLLODES   CESAREAN SECTION  01/08/2005   COLONOSCOPY WITH PROPOFOL  N/A 10/09/2016   Procedure: COLONOSCOPY WITH PROPOFOL ;  Surgeon: Therisa Bi, MD;  [facility] END[facility];  Service: Gastroenterology;  Laterality: N/A;   COLONOSCOPY WITH PROPOFOL  N/A 11/09/2019   Procedure: COLONOSCOPY WITH PROPOFOL ;  Surgeon: Unk Corinn Skiff, MD;  Location: [facility] ENDOSCOPY;  Service: Gastroenterology;  Laterality: N/A;   ESOPHAGOGASTRODUODENOSCOPY     GUM SURGERY  01/08/2002   KNEE ARTHROSCOPY WITH MEDIAL MENISECTOMY Right 07/24/2021   Procedure: Right medial meniscus root repair and patella chondroplasty;  Surgeon: Tobie Priest, MD;  Location: [facility] ORS;  Service: Orthopedics;  Laterality: Right;   PLANTAR FASCIA RELEASE Left 08/19/2014   Procedure: ENDOSCOPIC PLANTAR FASCIOTOMY RELEASE L WITH TOPAZ;  Surgeon: Donnice Cory, DPM;  Location: [facility];  Service: Podiatry;  Laterality: Left;  LMA WITH POPLITEAL BLOCK   TONSILLECTOMY  01/08/1982    Family Psychiatric History: I have reviewed family psychiatric history from progress note on 05/05/2021.  Family History:  Family History  Problem Relation Age of Onset   Obesity Mother     Diabetes Mother    High Cholesterol Mother    Hypertension Mother    Neuropathy Mother    Depression Mother    Anxiety disorder Mother    Asthma Mother    Arthritis Mother    Obesity Father    Hypertension Father    High Cholesterol Father    Depression Father    Anxiety disorder Father    Seizures Brother    Testicular cancer Brother    Cancer Brother    Breast cancer Maternal Grandmother    Hypertension Maternal Grandmother    Aneurysm Maternal Grandmother    Stroke Maternal Grandmother    Depression Maternal Grandmother    Varicose Veins Maternal Grandmother    Hypertension Maternal Grandfather    High Cholesterol Maternal Grandfather    Heart disease Maternal Grandfather        has a pacemaker   Stroke Maternal Grandfather    Alzheimer's disease Paternal Grandmother    Dementia Paternal Grandmother    Cancer Paternal Grandmother    Colon cancer Paternal Grandfather  Cancer Paternal Grandfather    Mental illness Maternal Aunt    Suicidality Cousin     Social History: I have reviewed social history from progress note on 05/05/2021. Social History   Socioeconomic History   Marital status: Divorced    Spouse name: Not on file   Number of children: 2   Years of education: 14   Highest education level: Bachelor's degree (e.g., BA, AB, BS)  Occupational History   Not on file  Tobacco Use   Smoking status: Former    Current packs/day: 0.00    Average packs/day: 0.3 packs/day for 10.0 years (2.5 ttl pk-yrs)    Types: Cigarettes    Start date: 07/04/2004    Quit date: 07/05/2014    Years since quitting: 9.5   Smokeless tobacco: Never  Vaping Use   Vaping status: Never Used  Substance and Sexual Activity   Alcohol use: Not Currently    Comment: rare   Drug use: No   Sexual activity: Yes    Partners: Male    Birth control/protection: Surgical    Comment: 2007  Other Topics Concern   Not on file  Social History Narrative   Not on file   Social Drivers of  Health   Tobacco Use: Medium Risk (01/29/2024)   Patient History    Smoking Tobacco Use: Former    Smokeless Tobacco Use: Never    Passive Exposure: Not on file  Financial Resource Strain: Medium Risk (12/11/2023)   Overall Financial Resource Strain (CARDIA)    Difficulty of Paying Living Expenses: Somewhat hard  Food Insecurity: No Food Insecurity (12/11/2023)   Epic    Worried About Radiation Protection Practitioner of Food in the Last Year: Never true    Ran Out of Food in the Last Year: Never true  Transportation Needs: No Transportation Needs (12/11/2023)   Epic    Lack of Transportation (Medical): No    Lack of Transportation (Non-Medical): No  Physical Activity: Sufficiently Active (12/11/2023)   Exercise Vital Sign    Days of Exercise per Week: 3 days    Minutes of Exercise per Session: 60 min  Stress: Stress Concern Present (12/11/2023)   [employer] of Occupational Health - Occupational Stress Questionnaire    Feeling of Stress: To some extent  Social Connections: Socially Isolated (12/11/2023)   Social Connection and Isolation Panel    Frequency of Communication with Friends and Family: Three times a week    Frequency of Social Gatherings with Friends and Family: Once a week    Attends Religious Services: Never    Database Administrator or Organizations: No    Attends Engineer, Structural: Not on file    Marital Status: Divorced  Depression (PHQ2-9): Low Risk (12/11/2023)   Depression (PHQ2-9)    PHQ-2 Score: 4  Alcohol Screen: Low Risk (12/11/2023)   Alcohol Screen    Last Alcohol Screening Score (AUDIT): 1  Housing: High Risk (12/11/2023)   Epic    Unable to Pay for Housing in the Last Year: Yes    Number of Times Moved in the Last Year: 0    Homeless in the Last Year: No  Utilities: Not At Risk (11/15/2023)   Epic    Threatened with loss of utilities: No  Health Literacy: Adequate Health Literacy (11/15/2023)   B1300 Health Literacy    Frequency of need for help with  medical instructions: Never    Allergies: Allergies[1]  Metabolic Disorder Labs: Lab Results  Component Value  Date   HGBA1C 5.6 09/04/2023   MPG 114 09/04/2023   MPG 103 11/08/2022   No results found for: PROLACTIN Lab Results  Component Value Date   CHOL 147 09/04/2023   TRIG 123 09/04/2023   HDL 43 (L) 09/04/2023   CHOLHDL 3.4 09/04/2023   VLDL 27 08/06/2019   LDLCALC 82 09/04/2023   LDLCALC 80 11/08/2022   Lab Results  Component Value Date   TSH 1.83 12/15/2021   TSH 2.59 04/04/2018    Therapeutic Level Labs: No results found for: LITHIUM No results found for: VALPROATE No results found for: CBMZ  Current Medications: Current Outpatient Medications  Medication Sig Dispense Refill   acetaminophen  (TYLENOL ) 500 MG tablet Take 1,000 mg by mouth every 6 (six) hours as needed for moderate pain (pain score 4-6).     amLODipine  (NORVASC ) 2.5 MG tablet TAKE 1 TABLET BY MOUTH EVERY DAY 90 tablet 1   atorvastatin  (LIPITOR) 20 MG tablet Take 1 tablet (20 mg total) by mouth every morning. 90 tablet 1   benzonatate  (TESSALON ) 100 MG capsule Take 2 capsules (200 mg total) by mouth 2 (two) times daily as needed for cough. 20 capsule 0   budesonide -formoterol  (SYMBICORT ) 160-4.5 MCG/ACT inhaler Inhale 2 puffs into the lungs in the morning and at bedtime. 1 each 12   buPROPion  (WELLBUTRIN  XL) 150 MG 24 hr tablet Take 1 tablet (150 mg total) by mouth daily with breakfast. Stop wellbutrin  xl 300 mg 90 tablet 3   busPIRone  (BUSPAR ) 30 MG tablet TAKE 1 TABLET BY MOUTH 2 TIMES DAILY. 180 tablet 1   desloratadine  (CLARINEX ) 5 MG tablet Take 1 tablet (5 mg total) by mouth daily. 90 tablet 1   fluconazole  (DIFLUCAN ) 150 MG tablet Take 1 tablet (150 mg total) by mouth every other day. 3 tablet 0   fluticasone  (FLONASE ) 50 MCG/ACT nasal spray Place 1 spray into both nostrils daily. 48 mL 1   lamoTRIgine  (LAMICTAL ) 25 MG tablet Take 1 tablet (25 mg total) by mouth 2 (two) times daily.  60 tablet 0   Melatonin 10 MG CAPS Take 10-20 mg by mouth at bedtime.     modafinil  (PROVIGIL ) 100 MG tablet Take 1 tablet (100 mg total) by mouth daily. 90 tablet 0   montelukast  (SINGULAIR ) 10 MG tablet TAKE 1 TABLET BY MOUTH EVERYDAY AT BEDTIME 90 tablet 0   tirzepatide  (MOUNJARO ) 7.5 MG/0.5ML Pen Inject 7.5 mg into the skin once a week. 2 mL 0   valsartan -hydrochlorothiazide  (DIOVAN -HCT) 160-25 MG tablet Take 1 tablet by mouth daily. 90 tablet 0   No current facility-administered medications for this visit.     Musculoskeletal: Strength & Muscle Tone: UTA Gait & Station: Seated Patient leans: N/A  Psychiatric Specialty Exam: Review of Systems  Psychiatric/Behavioral:         Mood swings    There were no vitals taken for this visit.There is no height or weight on file to calculate BMI.  General Appearance: Casual  Eye Contact:  Fair  Speech:  Clear and Coherent  Volume:  Normal  Mood:  mood swings improving  Affect:  Appropriate  Thought Process:  Goal Directed and Descriptions of Associations: Intact  Orientation:  Full (Time, Place, and Person)  Thought Content: Logical   Suicidal Thoughts:  No  Homicidal Thoughts:  No  Memory:  Immediate;   Fair Recent;   Fair Remote;   Fair  Judgement:  Fair  Insight:  Fair  Psychomotor Activity:  Normal  Concentration:  Concentration: Fair and Attention Span: Fair  Recall:  [employer] of Knowledge: Fair  Language: Fair  Akathisia:  No  Handed:  Right  AIMS (if indicated): not done  Assets:  Communication Skills Desire for Improvement Housing Social Support Transportation  ADL's:  Intact  Cognition: WNL  Sleep:  Fair   Screenings: AIMS    Flowsheet Row Video Visit from 06/13/2021 in [facility] Office Visit from 05/05/2021 in [facility]  AIMS Total Score 0 0   GAD-7    Flowsheet Row Office Visit from 12/11/2023 in [facility] Office Visit from 09/04/2023 in [facility] Office Visit from 05/08/2023 in [facility] Office Visit from 01/24/2023 in [facility] Office Visit from 01/15/2023 in [facility]  Total GAD-7 Score 2 5 0 0 0   PHQ2-9    Flowsheet Row Office Visit from 12/11/2023 in [facility] Most recent reading at 12/11/2023  2:38 PM Office Visit from 11/15/2023 in [facility] Most recent reading at 11/15/2023  3:08 PM Office Visit from 09/04/2023 in [facility] Most recent reading at 09/04/2023  3:19 PM Office Visit from 09/04/2023 in [facility] Most recent reading at 09/04/2023  8:55 AM Procedure visit from 08/14/2023 in [facility] Most recent reading at 08/14/2023  7:59 AM  PHQ-2 Total Score 2 0 0 0 0  PHQ-9 Total Score 4 -- 0 -- --   Flowsheet Row Video Visit from 01/29/2024 in [facility] Video Visit from 12/30/2023 in [facility] ED from 11/23/2023 in [facility] at [facility]  C-SSRS RISK CATEGORY No Risk No Risk No Risk     Assessment and Plan: Keia Rask Burgert is a 44 year old Caucasian female who presented for a follow-up appointment, discussed assessment and plan as noted below.  1. PTSD (post-traumatic stress disorder)-improving Reports improvement in mood symptoms on the current medication regimen. Continue Lamictal  25 mg twice daily Continue Buspirone  30 mg twice daily Continue Wellbutrin  150 mg daily Discontinue Lexapro  for noncompliance.  2. GAD (generalized anxiety disorder)-improving Currently reports improvement in anxiety symptoms. Continue psychotherapy sessions  with Ms. Veva Continue Buspirone  30 mg twice daily Currently using Clonazepam  from a previous prescription.  However no future refills without a urine drug screen.  Advised against abstaining from cannabis use for future prescription of Clonazepam .Reviewed [place] PMP AWARxE  3. Skin-picking disorder-unstable Ongoing skin picking and continues to make use of coping strategies learned in psychotherapy Continue psychotherapy sessions  4. History of borderline personality disorder Chronic-encouraged to continue DBT  5.Alcohol use disorder in remission Sober since 2017  Follow-up Follow-up in clinic in 2 months or sooner in person.   Consent: Patient/Guardian gives verbal consent for treatment and assignment of benefits for services provided during this visit. Patient/Guardian expressed understanding and agreed to proceed.   This note was generated in part or whole with voice recognition software. Voice recognition is usually quite accurate but there are transcription errors that can and very often do occur. I apologize for any typographical errors that were not detected and corrected.    Ronny Ruddell, MD 01/31/2024, 6:23 AM     [1]  Allergies Allergen Reactions   Wound Dressing Adhesive  Latex Rash   Metformin And Related     diarrhea

## 2024-01-31 ENCOUNTER — Ambulatory Visit: Admitting: Professional Counselor

## 2024-01-31 DIAGNOSIS — F1091 Alcohol use, unspecified, in remission: Secondary | ICD-10-CM | POA: Insufficient documentation

## 2024-02-04 ENCOUNTER — Other Ambulatory Visit: Payer: Self-pay | Admitting: Family Medicine

## 2024-02-04 ENCOUNTER — Other Ambulatory Visit: Payer: Self-pay | Admitting: Nurse Practitioner

## 2024-02-04 DIAGNOSIS — J4531 Mild persistent asthma with (acute) exacerbation: Secondary | ICD-10-CM

## 2024-02-04 DIAGNOSIS — G4733 Obstructive sleep apnea (adult) (pediatric): Secondary | ICD-10-CM

## 2024-02-04 DIAGNOSIS — I10 Essential (primary) hypertension: Secondary | ICD-10-CM

## 2024-02-04 DIAGNOSIS — L0100 Impetigo, unspecified: Secondary | ICD-10-CM

## 2024-02-04 DIAGNOSIS — R058 Other specified cough: Secondary | ICD-10-CM

## 2024-02-04 DIAGNOSIS — N1831 Chronic kidney disease, stage 3a: Secondary | ICD-10-CM

## 2024-02-05 ENCOUNTER — Ambulatory Visit: Admitting: Professional Counselor

## 2024-02-05 NOTE — Telephone Encounter (Signed)
 Requested medication (s) are due for refill today: review  Requested medication (s) are on the active medication list: yes  Last refill:  12/24/23 #20/0  Future visit scheduled: yes  Notes to clinic:  unsure if this is acute or maintenance med, please review for refill     Requested Prescriptions  Pending Prescriptions Disp Refills   benzonatate  (TESSALON ) 100 MG capsule [Pharmacy Med Name: BENZONATATE  100 MG CAPSULE] 20 capsule 0    Sig: Take 2 capsules (200 mg total) by mouth 2 (two) times daily as needed for cough.     Ear, Nose, and Throat:  Antitussives/Expectorants Passed - 02/05/2024 11:53 AM      Passed - Valid encounter within last 12 months    Recent Outpatient Visits           1 month ago Mild persistent asthma with acute exacerbation   [facility] Gareth Clarity F, FNP   1 month ago Morbid obesity with BMI of 50.0-59.9, adult [facility])   [facility] Glenard Mire, MD   2 months ago Well woman exam   [facility] Glenard Mire, MD   5 months ago Moderate episode of recurrent major depressive disorder [facility])   [place] [facility] Sowles, Krichna, MD   8 months ago Morbid obesity with BMI of 50.0-59.9, adult [facility])   [facility] Sowles, Krichna, MD

## 2024-02-05 NOTE — Telephone Encounter (Signed)
 Requested medication (s) are due for refill today: na   Requested medication (s) are on the active medication list: yes   Last refill:  09/04/23 #90 0 refills  Future visit scheduled: yes 03/11/24  Notes to clinic:  medication not assigned to a protocol. Do you want to refill Rx?     Requested Prescriptions  Pending Prescriptions Disp Refills   modafinil  (PROVIGIL ) 100 MG tablet [Pharmacy Med Name: MODAFINIL  100 MG TABLET] 90 tablet 0    Sig: TAKE 1 TABLET BY MOUTH EVERY DAY     Off-Protocol Failed - 02/05/2024 11:56 AM      Failed - Medication not assigned to a protocol, review manually.      Passed - Valid encounter within last 12 months    Recent Outpatient Visits           1 month ago Mild persistent asthma with acute exacerbation   [place] [facility] Gareth Mliss FALCON, FNP   1 month ago Morbid obesity with BMI of 50.0-59.9, adult [facility])   [place] [facility] Mire, MD   2 months ago Well woman exam   [facility] Glenard Mire, MD   5 months ago Moderate episode of recurrent major depressive disorder [facility])   [place] [facility] Glen[facility] Mire, MD   8 months ago Morbid obesity with BMI of 50.0-59.9, adult [facility])   [place] [facility] Glen[facility] Mire, MD              Signed Prescriptions Disp Refills   valsartan -hydrochlorothiazide  (DIOVAN -HCT) 160-25 MG tablet 90 tablet 0    Sig: TAKE 1 TABLET BY MOUTH EVERY DAY     Cardiovascular: ARB + Diuretic Combos Failed - 02/05/2024 11:56 AM      Failed - Cr in normal range and within 180 days    Creat  Date Value Ref Range Status  12/11/2023 1.29 (H) 0.50 - 0.99 mg/dL Final   Creatinine, Urine  Date Value Ref Range Status  04/04/2018 182 20 - 275 mg/dL Final         Passed - K in normal range and within 180 days    Potassium  Date Value Ref Range Status  12/11/2023 4.0 3.5 - 5.3 mmol/L Final  08/26/2013  4.1 3.5 - 5.1 mmol/L Final         Passed - Na in normal range and within 180 days    Sodium  Date Value Ref Range Status  12/11/2023 142 135 - 146 mmol/L Final  08/26/2013 144 136 - 145 mmol/L Final         Passed - eGFR is 10 or above and within 180 days    GFR, Est African American  Date Value Ref Range Status  06/08/2020 61 > OR = 60 mL/min/1.85m2 Final   GFR, Est Non African American  Date Value Ref Range Status  06/08/2020 52 (L) > OR = 60 mL/min/1.63m2 Final   GFR, Estimated  Date Value Ref Range Status  01/10/2023 >60 >60 mL/min Final    Comment:    (NOTE) Calculated using the CKD-EPI Creatinine Equation (2021)    eGFR  Date Value Ref Range Status  12/11/2023 52 (L) > OR = 60 mL/min/1.51m2 Final         Passed - Patient is not pregnant      Passed - Last BP in normal range    BP Readings from Last 1 Encounters:  12/11/23 123/69  Passed - Valid encounter within last 6 months    Recent Outpatient Visits           1 month ago Mild persistent asthma with acute exacerbation   [place] Gareth Clarity F, FNP   1 month ago Morbid obesity with BMI of 50.0-59.9, adult [facility])   [place] [facility] Glenard Mire, MD   2 months ago Well woman exam   [facility] [place], Mire, MD   5 months ago Moderate episode of recurrent major depressive disorder [facility])   [place] [facility] [place], Mire, MD   8 months ago Morbid obesity with BMI of 50.0-59.9, adult [facility])   [place] [facility] [place], Mire, MD              Refused Prescriptions Disp Refills   mupirocin  ointment (BACTROBAN ) 2 % [Pharmacy Med Name: MUPIROCIN  2% OINTMENT] 22 g 0    Sig: APPLY TO AFFECTED AREA TWICE A DAY     Off-Protocol Failed - 02/05/2024 11:56 AM      Failed - Medication not assigned to a protocol, review manually.      Passed - Valid encounter within last 12  months    Recent Outpatient Visits           1 month ago Mild persistent asthma with acute exacerbation   [facility] Gareth Clarity F, FNP   1 month ago Morbid obesity with BMI of 50.0-59.9, adult [facility])   [place] [facility] Mire, MD   2 months ago Well woman exam   [facility] [place], Mire, MD   5 months ago Moderate episode of recurrent major depressive disorder [facility])   [place] [facility] [place], Mire, MD   8 months ago Morbid obesity with BMI of 50.0-59.9, adult [facility])   [place] [facility] [place], Krichna, MD               azithromycin  (ZITHROMAX ) 250 MG tablet [Pharmacy Med Name: AZITHROMYCIN  250 MG TABLET] 6 tablet 0    Sig: TAKE 2 TABLETS BY MOUTH TODAY, THEN TAKE 1 TABLET DAILY FOR 4 DAYS AS DIRECTED     Off-Protocol Failed - 02/05/2024 11:56 AM      Failed - Medication not assigned to a protocol, review manually.      Passed - Valid encounter within last 12 months    Recent Outpatient Visits           1 month ago Mild persistent asthma with acute exacerbation   [facility] Gareth Clarity F, FNP   1 month ago Morbid obesity with BMI of 50.0-59.9, adult [facility])   [place] [facility] Glenard Mire, MD   2 months ago Well woman exam   [facility] Glen[facility] Mire, MD   5 months ago Moderate episode of recurrent major depressive disorder [facility])   [place] [facility] [place], Krichna, MD   8 months ago Morbid obesity with BMI of 50.0-59.9, adult [facility])   [facility] [place], Krichna, MD

## 2024-02-05 NOTE — Telephone Encounter (Signed)
 Requested by interface surescripts. Medication mupirocin  discontinued 12/11/23 and azithromycin  expired 11/20/23.  Requested Prescriptions  Pending Prescriptions Disp Refills   modafinil  (PROVIGIL ) 100 MG tablet [Pharmacy Med Name: MODAFINIL  100 MG TABLET] 90 tablet 0    Sig: TAKE 1 TABLET BY MOUTH EVERY DAY     Off-Protocol Failed - 02/05/2024 11:53 AM      Failed - Medication not assigned to a protocol, review manually.      Passed - Valid encounter within last 12 months    Recent Outpatient Visits           1 month ago Mild persistent asthma with acute exacerbation   [place] [facility] Gareth Mliss FALCON, FNP   1 month ago Morbid obesity with BMI of 50.0-59.9, adult [facility])   [place] [facility] Glen[facility] Mire, MD   2 months ago Well woman exam   [place] [facility] Glenard Mire, MD   5 months ago Moderate episode of recurrent major depressive disorder [facility])   [place] [facility] Glenard Mire, MD   8 months ago Morbid obesity with BMI of 50.0-59.9, adult [facility])   [place] [facility] [place], Krichna, MD               valsartan -hydrochlorothiazide  (DIOVAN -HCT) 160-25 MG tablet [Pharmacy Med Name: VALSARTAN -HCTZ 160-25 MG TAB] 90 tablet 0    Sig: TAKE 1 TABLET BY MOUTH EVERY DAY     Cardiovascular: ARB + Diuretic Combos Failed - 02/05/2024 11:53 AM      Failed - Cr in normal range and within 180 days    Creat  Date Value Ref Range Status  12/11/2023 1.29 (H) 0.50 - 0.99 mg/dL Final   Creatinine, Urine  Date Value Ref Range Status  04/04/2018 182 20 - 275 mg/dL Final         Passed - K in normal range and within 180 days    Potassium  Date Value Ref Range Status  12/11/2023 4.0 3.5 - 5.3 mmol/L Final  08/26/2013 4.1 3.5 - 5.1 mmol/L Final         Passed - Na in normal range and within 180 days    Sodium  Date Value Ref Range Status  12/11/2023 142 135 - 146 mmol/L Final   08/26/2013 144 136 - 145 mmol/L Final         Passed - eGFR is 10 or above and within 180 days    GFR, Est African American  Date Value Ref Range Status  06/08/2020 61 > OR = 60 mL/min/1.104m2 Final   GFR, Est Non African American  Date Value Ref Range Status  06/08/2020 52 (L) > OR = 60 mL/min/1.61m2 Final   GFR, Estimated  Date Value Ref Range Status  01/10/2023 >60 >60 mL/min Final    Comment:    (NOTE) Calculated using the CKD-EPI Creatinine Equation (2021)    eGFR  Date Value Ref Range Status  12/11/2023 52 (L) > OR = 60 mL/min/1.75m2 Final         Passed - Patient is not pregnant      Passed - Last BP in normal range    BP Readings from Last 1 Encounters:  12/11/23 123/69         Passed - Valid encounter within last 6 months    Recent Outpatient Visits           1 month ago Mild persistent asthma with acute exacerbation  [facility] F[facility]   1 month ago Morbid obesity with BMI of 50.0-59.9, adult [facility])   [place] [facility] Glen[facility] Mire, MD   2 months ago Well woman exam   [facility] [place], Mire, MD   5 months ago Moderate episode of recurrent major depressive disorder [facility])   [place] [facility] [place], Mire, MD   8 months ago Morbid obesity with BMI of 50.0-59.9, adult [facility])   [place] [facility], Mire, MD              Refused Prescriptions Disp Refills   mupirocin  ointment (BACTROBAN ) 2 % [Pharmacy Med Name: MUPIROCIN  2% OINTMENT] 22 g 0    Sig: APPLY TO AFFECTED AREA TWICE A DAY     Off-Protocol Failed - 02/05/2024 11:53 AM      Failed - Medication not assigned to a protocol, review manually.      Passed - Valid encounter within last 12 months    Recent Outpatient Visits           1 month ago Mild persistent asthma with acute exacerbation   [facility] Gareth Clarity F,  FNP   1 month ago Morbid obesity with BMI of 50.0-59.9, adult [facility])   [place] [facility] Glenard Mire, MD   2 months ago Well woman exam   [facility] [place], Mire, MD   5 months ago Moderate episode of recurrent major depressive disorder [facility])   [place] [facility] [place], Mire, MD   8 months ago Morbid obesity with BMI of 50.0-59.9, adult [facility])   [place] [facility] [place], Krichna, MD               azithromycin  (ZITHROMAX ) 250 MG tablet [Pharmacy Med Name: AZITHROMYCIN  250 MG TABLET] 6 tablet 0    Sig: TAKE 2 TABLETS BY MOUTH TODAY, THEN TAKE 1 TABLET DAILY FOR 4 DAYS AS DIRECTED     Off-Protocol Failed - 02/05/2024 11:53 AM      Failed - Medication not assigned to a protocol, review manually.      Passed - Valid encounter within last 12 months    Recent Outpatient Visits           1 month ago Mild persistent asthma with acute exacerbation   [facility] Gareth Clarity F, FNP   1 month ago Morbid obesity with BMI of 50.0-59.9, adult [facility])   [facility] Glenard Mire, MD   2 months ago Well woman exam   [facility] Glenard Mire, MD   5 months ago Moderate episode of recurrent major depressive disorder [facility])    [facility] [place], Krichna, MD   8 months ago Morbid obesity with BMI of 50.0-59.9, adult [facility])   [facility] [place], Krichna, MD

## 2024-02-05 NOTE — Telephone Encounter (Signed)
 Requested by interface surescripts. Future visit 03/11/24. Requested Prescriptions  Pending Prescriptions Disp Refills   modafinil  (PROVIGIL ) 100 MG tablet [Pharmacy Med Name: MODAFINIL  100 MG TABLET] 90 tablet 0    Sig: TAKE 1 TABLET BY MOUTH EVERY DAY     Off-Protocol Failed - 02/05/2024 11:56 AM      Failed - Medication not assigned to a protocol, review manually.      Passed - Valid encounter within last 12 months    Recent Outpatient Visits           1 month ago Mild persistent asthma with acute exacerbation   [place] [facility] Gareth Mliss FALCON, FNP   1 month ago Morbid obesity with BMI of 50.0-59.9, adult [facility])   [place] [facility] Glenard Mire, MD   2 months ago Well woman exam   [facility] Glenard Mire, MD   5 months ago Moderate episode of recurrent major depressive disorder [facility])   [place] [facility] Glenard Mire, MD   8 months ago Morbid obesity with BMI of 50.0-59.9, adult [facility])   [facility] [place], Mire, MD               valsartan -hydrochlorothiazide  (DIOVAN -HCT) 160-25 MG tablet [Pharmacy Med Name: VALSARTAN -HCTZ 160-25 MG TAB] 90 tablet 0    Sig: TAKE 1 TABLET BY MOUTH EVERY DAY     Cardiovascular: ARB + Diuretic Combos Failed - 02/05/2024 11:56 AM      Failed - Cr in normal range and within 180 days    Creat  Date Value Ref Range Status  12/11/2023 1.29 (H) 0.50 - 0.99 mg/dL Final   Creatinine, Urine  Date Value Ref Range Status  04/04/2018 182 20 - 275 mg/dL Final         Passed - K in normal range and within 180 days    Potassium  Date Value Ref Range Status  12/11/2023 4.0 3.5 - 5.3 mmol/L Final  08/26/2013 4.1 3.5 - 5.1 mmol/L Final         Passed - Na in normal range and within 180 days    Sodium  Date Value Ref Range Status  12/11/2023 142 135 - 146 mmol/L Final  08/26/2013 144 136 - 145 mmol/L Final          Passed - eGFR is 10 or above and within 180 days    GFR, Est African American  Date Value Ref Range Status  06/08/2020 61 > OR = 60 mL/min/1.52m2 Final   GFR, Est Non African American  Date Value Ref Range Status  06/08/2020 52 (L) > OR = 60 mL/min/1.57m2 Final   GFR, Estimated  Date Value Ref Range Status  01/10/2023 >60 >60 mL/min Final    Comment:    (NOTE) Calculated using the CKD-EPI Creatinine Equation (2021)    eGFR  Date Value Ref Range Status  12/11/2023 52 (L) > OR = 60 mL/min/1.54m2 Final         Passed - Patient is not pregnant      Passed - Last BP in normal range    BP Readings from Last 1 Encounters:  12/11/23 123/69         Passed - Valid encounter within last 6 months    Recent Outpatient Visits           1 month ago Mild persistent asthma with acute exacerbation   [facility] [place],  Mliss FALCON, FNP   1 month ago Morbid obesity with BMI of 50.0-59.9, adult [facility])   [place] [facility] Glenard Mire, MD   2 months ago Well woman exam   [facility] [place], Mire, MD   5 months ago Moderate episode of recurrent major depressive disorder [facility])   [place] [facility] [place], Mire, MD   8 months ago Morbid obesity with BMI of 50.0-59.9, adult [facility])   [place] [facility] [place], Mire, MD              Refused Prescriptions Disp Refills   mupirocin  ointment (BACTROBAN ) 2 % [Pharmacy Med Name: MUPIROCIN  2% OINTMENT] 22 g 0    Sig: APPLY TO AFFECTED AREA TWICE A DAY     Off-Protocol Failed - 02/05/2024 11:56 AM      Failed - Medication not assigned to a protocol, review manually.      Passed - Valid encounter within last 12 months    Recent Outpatient Visits           1 month ago Mild persistent asthma with acute exacerbation   [facility] Gareth Mliss F, FNP   1 month ago Morbid obesity with BMI of  50.0-59.9, adult [facility])   [place] [facility] Glenard Mire, MD   2 months ago Well woman exam   [facility] [place], Mire, MD   5 months ago Moderate episode of recurrent major depressive disorder [facility])   [place] [facility] [place], Mire, MD   8 months ago Morbid obesity with BMI of 50.0-59.9, adult [facility])   [place] [place], Krichna, MD               azithromycin  (ZITHROMAX ) 250 MG tablet [Pharmacy Med Name: AZITHROMYCIN  250 MG TABLET] 6 tablet 0    Sig: TAKE 2 TABLETS BY MOUTH TODAY, THEN TAKE 1 TABLET DAILY FOR 4 DAYS AS DIRECTED     Off-Protocol Failed - 02/05/2024 11:56 AM      Failed - Medication not assigned to a protocol, review manually.      Passed - Valid encounter within last 12 months    Recent Outpatient Visits           1 month ago Mild persistent asthma with acute exacerbation   [facility] Gareth Mliss F, FNP   1 month ago Morbid obesity with BMI of 50.0-59.9, adult [facility])   [place] [facility] Glenard Mire, MD   2 months ago Well woman exam   [facility] Glenard Mire, MD   5 months ago Moderate episode of recurrent major depressive disorder [facility])    [facility] [place], Krichna, MD   8 months ago Morbid obesity with BMI of 50.0-59.9, adult [facility])   [place] [facility] [place], Krichna, MD

## 2024-02-07 ENCOUNTER — Ambulatory Visit: Admitting: Professional Counselor

## 2024-02-12 IMAGING — MR MR KNEE*L* W/O CM
6 series · 40 of 40 positions shown · non-contrast
Comparison: Left knee radiographs 03/10/2021

CLINICAL DATA: Knee trauma. Internal derangement suspected. Fall in
shower 2 weeks ago. Heard a pop in left knee. Lateral pain.

EXAM:
MRI OF THE LEFT KNEE WITHOUT CONTRAST
TECHNIQUE: Multiplanar, multisequence MR imaging of the knee was performed. No
intravenous contrast was administered.

[Series 10: T2 fat-sat · coronal · left · 4.0mm · 0.59mm/px · 6 of 27 slices shown (1 of 3)]
[im 1/27]
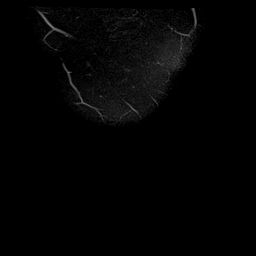
[im 6/27]
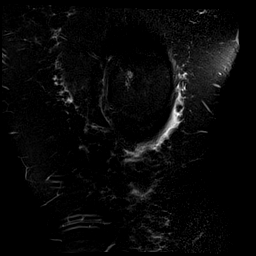
[im 11/27]
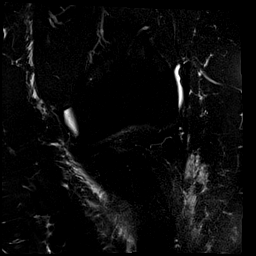
[im 16/27]
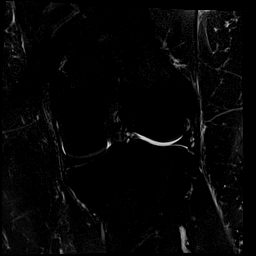
[im 21/27]
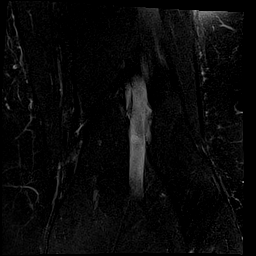
[im 27/27]
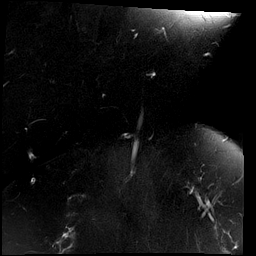

[Series 11: T2 fat-sat · axial · left · 4.0mm · 0.62mm/px · z∈[-55,+69]mm · 6 of 26 slices shown (2 of 3)]
[im 1/26]
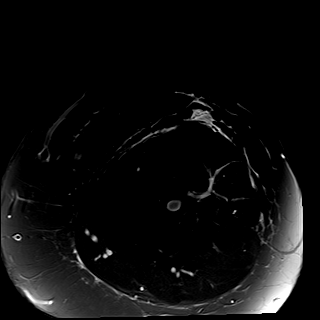
[im 6/26]
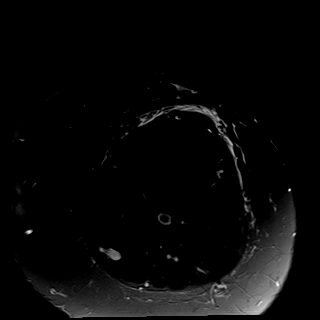
[im 11/26]
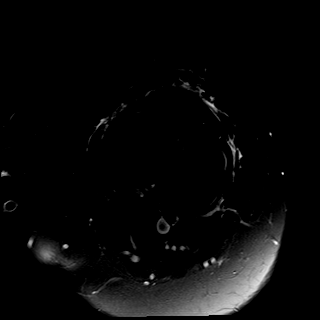
[im 16/26]
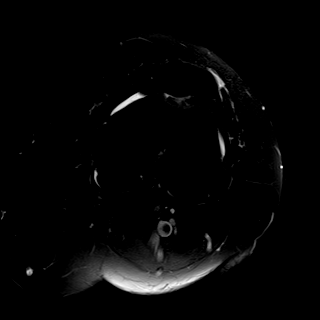
[im 21/26]
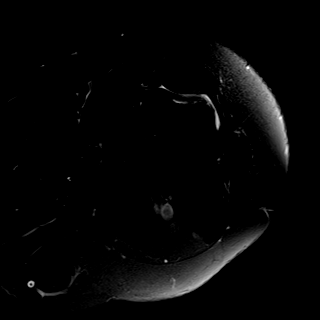
[im 26/26]
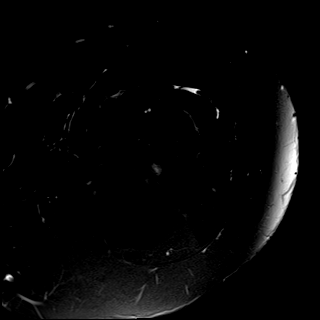

[Series 12: T1 · coronal · left · 4.0mm · 0.59mm/px · 6 of 30 slices shown]
[im 1/30]
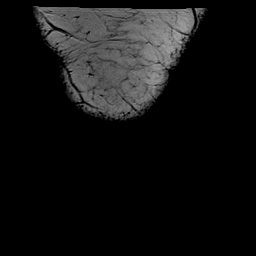
[im 6/30]
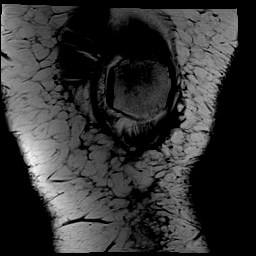
[im 12/30]
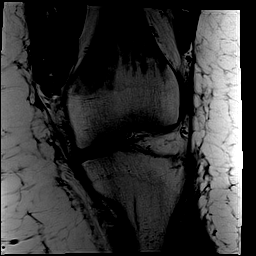
[im 18/30]
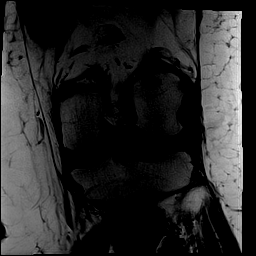
[im 24/30]
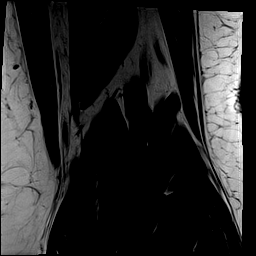
[im 30/30]
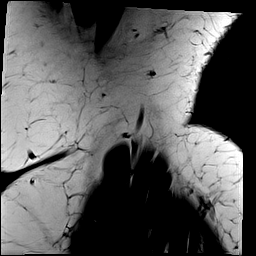

[Series 13: PD fat-sat · coronal · left · 4.0mm · 0.59mm/px · 6 of 30 slices shown (1 of 2)]
[im 1/30]
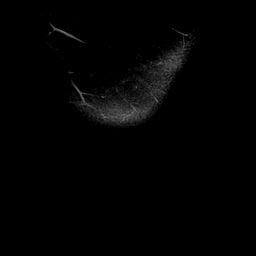
[im 6/30]
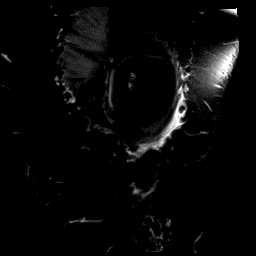
[im 12/30]
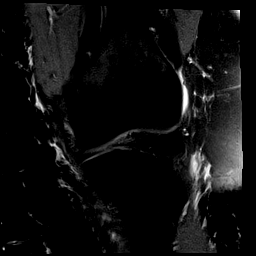
[im 18/30]
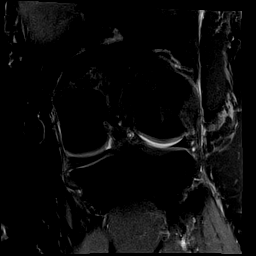
[im 24/30]
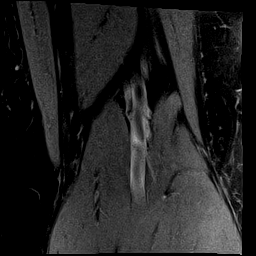
[im 30/30]
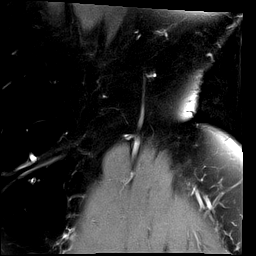

[Series 14: PD fat-sat · sagittal · left · 3.0mm · 0.59mm/px · 8 of 36 slices shown (2 of 2)]
[im 1/36]
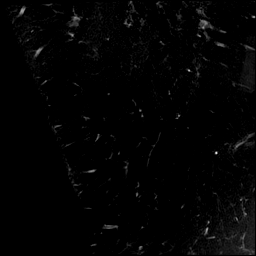
[im 6/36]
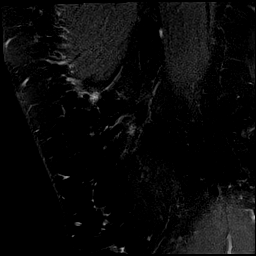
[im 11/36]
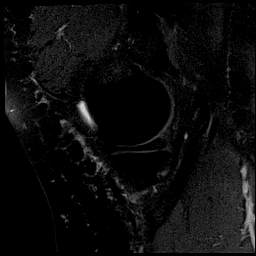
[im 16/36]
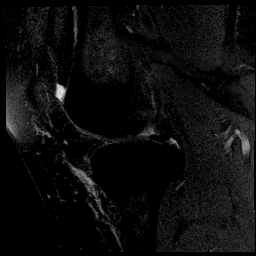
[im 21/36]
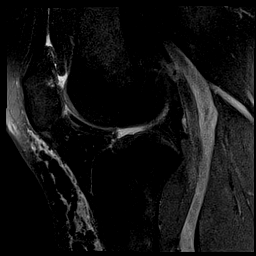
[im 26/36]
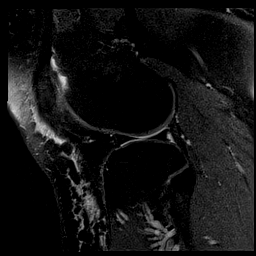
[im 31/36]
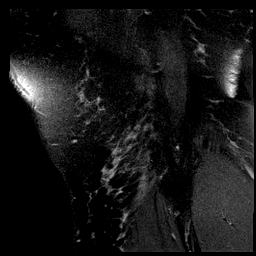
[im 36/36]
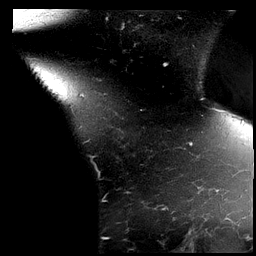

[Series 15: T2 fat-sat · sagittal · left · 3.0mm · 0.59mm/px · 8 of 36 slices shown (3 of 3)]
[im 1/36]
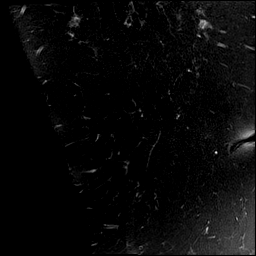
[im 6/36]
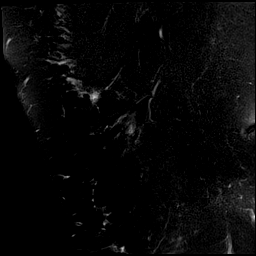
[im 11/36]
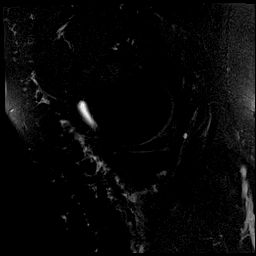
[im 16/36]
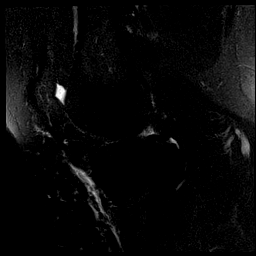
[im 21/36]
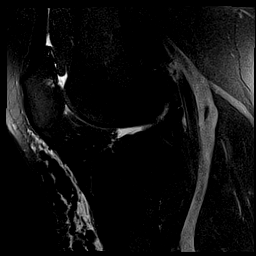
[im 26/36]
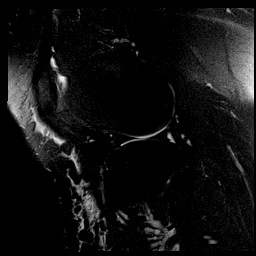
[im 31/36]
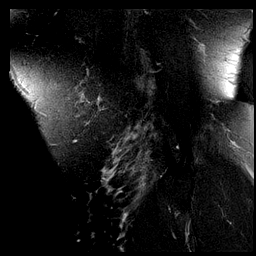
[im 36/36]
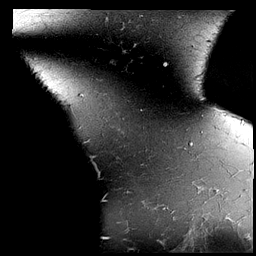

[40 of 40 positions shown; findings below may reference images not displayed]

FINDINGS: MENISCI

Medial meniscus: There is mild intermediate proton density signal
intrasubstance degeneration within the junction of the body and
posterior horn of the medial meniscus. No tear is seen extending
through an articular surface.

Lateral meniscus:  Intact.

LIGAMENTS

Cruciates: The ACL and PCL are intact.

Collaterals: The medial collateral ligament is intact. The fibular
collateral ligament, biceps femoris tendon, iliotibial band, and
popliteus tendon are intact.

CARTILAGE

Patellofemoral: There is moderate thinning of the superior aspect of
the junction of the patellar apex and lateral patellar facet
cartilage with moderate subchondral cystic change (axial image 7 and
sagittal image 21). Mild thinning of the lateral trochlear
cartilage.

Medial: Mild weight-bearing medial femoral condyle cartilage
thinning.

Lateral:  Intact.

Joint: Smalljoint effusion. Normal Hoffa's fat pad. No plical
thickening. Moderate edema and mild fluid within the deep
subcutaneous fat anterior to the patellar tendon and proximal tibia,
greatest at the level of the proximal tibia.

Popliteal Fossa:  No Baker's cyst.

Extensor Mechanism:  Intact quadriceps tendon and patellar tendon.

Bones:  No acute fracture or dislocation.

Other: None.
IMPRESSION: :
IMPRESSION: 1. Moderate degenerative changes of the superior patellar cartilage.
2. Small joint effusion.
3. Intact menisci, cruciate ligaments, and collateral ligaments.

## 2024-02-19 ENCOUNTER — Ambulatory Visit: Admitting: Professional Counselor

## 2024-02-24 ENCOUNTER — Encounter

## 2024-03-04 ENCOUNTER — Ambulatory Visit: Admitting: Professional Counselor

## 2024-03-11 ENCOUNTER — Ambulatory Visit: Admitting: Family Medicine

## 2024-04-06 ENCOUNTER — Ambulatory Visit: Admitting: Psychiatry
